# Patient Record
Sex: Male | Born: 1945 | Race: White | Hispanic: No | Smoking: Former smoker
Health system: Southern US, Community
[De-identification: ages and names within clinical notes are randomized; demographics above are authoritative.]

## PROBLEM LIST (undated history)

## (undated) DIAGNOSIS — M1990 Unspecified osteoarthritis, unspecified site: Secondary | ICD-10-CM

## (undated) DIAGNOSIS — C61 Malignant neoplasm of prostate: Secondary | ICD-10-CM

## (undated) DIAGNOSIS — R000 Tachycardia, unspecified: Secondary | ICD-10-CM

## (undated) DIAGNOSIS — I5189 Other ill-defined heart diseases: Secondary | ICD-10-CM

## (undated) DIAGNOSIS — I10 Essential (primary) hypertension: Secondary | ICD-10-CM

## (undated) DIAGNOSIS — E119 Type 2 diabetes mellitus without complications: Secondary | ICD-10-CM

## (undated) DIAGNOSIS — I6529 Occlusion and stenosis of unspecified carotid artery: Secondary | ICD-10-CM

## (undated) DIAGNOSIS — K219 Gastro-esophageal reflux disease without esophagitis: Secondary | ICD-10-CM

## (undated) DIAGNOSIS — I700 Atherosclerosis of aorta: Secondary | ICD-10-CM

## (undated) DIAGNOSIS — E785 Hyperlipidemia, unspecified: Secondary | ICD-10-CM

## (undated) DIAGNOSIS — Z789 Other specified health status: Secondary | ICD-10-CM

## (undated) HISTORY — DX: Malignant neoplasm of prostate: C61

## (undated) HISTORY — DX: Essential (primary) hypertension: I10

## (undated) HISTORY — DX: Occlusion and stenosis of unspecified carotid artery: I65.29

## (undated) HISTORY — DX: Type 2 diabetes mellitus without complications: E11.9

## (undated) HISTORY — DX: Atherosclerosis of aorta: I70.0

## (undated) HISTORY — DX: Other specified health status: Z78.9

## (undated) HISTORY — DX: Other ill-defined heart diseases: I51.89

## (undated) HISTORY — DX: Gastro-esophageal reflux disease without esophagitis: K21.9

## (undated) HISTORY — DX: Unspecified osteoarthritis, unspecified site: M19.90

## (undated) HISTORY — DX: Tachycardia, unspecified: R00.0

## (undated) HISTORY — DX: Hyperlipidemia, unspecified: E78.5

---

## 1971-03-27 HISTORY — PX: APPENDECTOMY: SHX54

## 2008-12-15 ENCOUNTER — Ambulatory Visit: Payer: Self-pay | Admitting: Family Medicine

## 2008-12-28 ENCOUNTER — Ambulatory Visit: Payer: Self-pay | Admitting: Family Medicine

## 2009-03-10 ENCOUNTER — Ambulatory Visit: Payer: Self-pay | Admitting: Unknown Physician Specialty

## 2010-03-26 HISTORY — PX: PROSTATECTOMY: SHX69

## 2011-02-25 ENCOUNTER — Inpatient Hospital Stay: Payer: Self-pay | Admitting: Surgery

## 2011-02-28 LAB — PATHOLOGY REPORT

## 2011-03-27 HISTORY — PX: CHOLECYSTECTOMY: SHX55

## 2011-03-27 HISTORY — PX: OTHER SURGICAL HISTORY: SHX169

## 2013-08-20 DIAGNOSIS — R5383 Other fatigue: Secondary | ICD-10-CM | POA: Insufficient documentation

## 2013-08-20 DIAGNOSIS — K219 Gastro-esophageal reflux disease without esophagitis: Secondary | ICD-10-CM | POA: Insufficient documentation

## 2013-08-26 ENCOUNTER — Ambulatory Visit: Payer: Self-pay | Admitting: Family Medicine

## 2013-09-30 ENCOUNTER — Ambulatory Visit: Payer: Self-pay | Admitting: Gastroenterology

## 2013-09-30 LAB — HM COLONOSCOPY

## 2014-03-09 ENCOUNTER — Ambulatory Visit (INDEPENDENT_AMBULATORY_CARE_PROVIDER_SITE_OTHER): Payer: Commercial Managed Care - HMO | Admitting: Neurology

## 2014-03-09 ENCOUNTER — Encounter: Payer: Self-pay | Admitting: Neurology

## 2014-03-09 VITALS — BP 136/83 | HR 101 | Temp 97.4°F | Ht 67.5 in | Wt 182.0 lb

## 2014-03-09 DIAGNOSIS — R413 Other amnesia: Secondary | ICD-10-CM

## 2014-03-09 NOTE — Patient Instructions (Signed)
Your memory loss is rather mild.  We will recheck again in 6 months and we will consider and brain MRI at the time.  I think overall you are doing fairly well but I do want to suggest a few things today:  Remember to drink plenty of fluid, eat healthy meals and do not skip any meals. Try to eat protein with a every meal and eat a healthy snack such as fruit or nuts in between meals. Try to keep a regular sleep-wake schedule and try to exercise daily, particularly in the form of walking, 20-30 minutes a day, if you can. Good nutrition, proper sleep and exercise can help her cognitive function.  Engage in social activities in your community and with your family and try to keep up with current events by reading the newspaper or watching the news. If you have computer and can go online, try [email or web address]. Also, you may like to do word finding puzzles or crossword puzzles.  As far as your medications are concerned, I would like to suggest no new meds.   As far as diagnostic testing: we will consider a brain MRI next time.   I would like to see you back in 6 months, sooner if we need to. Please call us with any interim questions, concerns, problems, updates or refill requests.  Our phone number is [phone]. We also have an after hours call service for urgent matters and there is a physician on-call for urgent questions. For any emergencies you know to call 911 or go to the nearest emergency room.

## 2014-03-09 NOTE — Progress Notes (Signed)
Subjective:    Patient ID: Gerald Bauer is a 68 y.o. male.  HPI     Star Age, MD, PhD [facility] [address] [place], [place] [address]  Dear Dr. Sanda Klein,    I saw your patient, Gerald Bauer, upon your kind request in my neurologic clinic today for initial consultation of his memory loss. The patient is accompanied by his wife today. As you know, Gerald Bauer is a 68 year old right-handed gentleman with an underlying medical history of hypertension, hyperlipidemia, reflux disease, arthritis and prostate cancer, status post prostatectomy,  cholecystectomy and appendectomy, who reports memory loss for the past year. Recent laboratory test results from 02/15/2014 included a CBC with differential which was unremarkable, total cholesterol elevated at 261 with LDL elevated at 190, CMP normal with blood sugar at 104, TSH normal at 2.45, vitamin D mildly low at 25.9, B12 of 376. He had a carotid Doppler ultrasound on 08/26/2013 which showed no significant stenosis of the right internal carotid artery and approximately 50% stenosis of the left internal carotid artery.  He has never had a brain scan.  He does not have a family history of dementia. His mother lived to be 25 years old and had some memory loss in her early 16s. His father lived to be 59 and died of a heart attack. The patient has been on B12 and vitamin D orally, over-the-counter since his latest blood work. He's going to see a new cardiologist soon. He has been driving without difficulties. There is no report of hallucinations or delusions or significant mood disorder. 3 years ago they bought a motor home and have been traveling with it. He has been able to drive it without any difficulties. They have a trip planned to [place] later this month. He has 2 children. 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 He now works  part-time. 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 He is active. His wife does not feel there is a big problem with his memory. He drinks caffeine regularly. He stopped drinking alcohol when he was started on blood pressure medication. There was no history of heavy drinking. He snores some and has difficulty maintaining sleep. He denies apneas or gasping sensations while asleep. He drinks 4 cups of coffee every day and 1 soda.   His Past Medical History Is Significant For: Past Medical History  Diagnosis Date  . Hypertension   . Hyperlipemia   . Acid reflux   . Arthritis     hands  . Prostate cancer     history    His Past Surgical History Is Significant For: Past Surgical History  Procedure Laterality Date  . Appendectomy  1973  . Prostatectomy  2012  . Choly  2013    His Family History Is Significant For: Family History  Problem Relation Age of Onset  . Dementia Mother   . High Cholesterol Mother   . Hypertension Mother   . Arthritis Maternal Grandmother   . Heart disease Father   . Prostate cancer Brother     His Social History Is Significant For: History   Social History  . Marital Status: Married    Spouse Name: Gerald Bauer    Number of Children: 2  . Years of Education: 12   Occupational History  .      [employer]  Social History Main Topics  . Smoking status: Former Research scientist (life sciences)  . Smokeless tobacco: Former Systems developer     Comment: quit 1999  . Alcohol Use: None     Comment: quit 2014  . Drug Use: No  . Sexual Activity: None   Other Topics Concern  . None   Social History Narrative   Consumes of caffeine daily    His Allergies Are:  Allergies  Allergen Reactions  . Amoxicillin Swelling  :   His Current Medications Are:  Outpatient Encounter Prescriptions as of 03/09/2014  Medication Sig  . Adhesive Bandages (PLASTIC BANDAGES 3/4") MISC   . AFLURIA SUSP   . Alcohol Swabs (ALCOHOL PREPS) PADS   . amLODipine (NORVASC) 10 MG tablet    . aspirin 81 MG tablet Take 81 mg by mouth daily.  Marland Kitchen aspirin EC 81 MG tablet Take by mouth.  . Cholecalciferol (VITAMIN D3) 2000 UNITS TABS Take by mouth. 2000 iu until Jan 25th, then 1000iu daily  . Cyanocobalamin (VITAMIN B-12 CR PO)   . omeprazole (PRILOSEC) 20 MG capsule Take by mouth daily.   Marland Kitchen OVER THE COUNTER MEDICATION Cold and hot patches  . OVER THE COUNTER MEDICATION as needed. Effervescent denture tabs  . ZETIA 10 MG tablet   . ZOSTAVAX 00938 UNT/0.65ML injection   . [DISCONTINUED] UNABLE TO FIND 20 mg daily. omeprazole  . [DISCONTINUED] Applicators (COTTON SWABS) SWAB   . [DISCONTINUED] UNABLE TO FIND 81 mg. aspirin  :  Review of Systems:  Out of a complete 14 point review of systems, all are reviewed and negative with the exception of these symptoms as listed below:   Review of Systems  HENT:       Ringing in ears  Neurological:       Memory loss, headaches   Objective:  Neurologic Exam  Physical Exam Physical Examination:   Filed Vitals:   03/09/14 1317  BP: 136/83  Pulse: 101  Temp: 97.4 F (36.3 C)   General Examination: The patient is a very pleasant 68 y.o. male in no acute distress. He appears well-developed and well-nourished and well groomed.   HEENT: Normocephalic, atraumatic, pupils are equal, round and reactive to light and accommodation. Funduscopic exam is normal with sharp disc margins noted. Extraocular tracking is good without limitation to gaze excursion or nystagmus noted. Normal smooth pursuit is noted. Hearing is grossly intact. Tympanic membranes are clear bilaterally. Face is symmetric with normal facial animation and normal facial sensation. Speech is clear with no dysarthria noted. There is no hypophonia. There is no lip, neck/head, jaw or voice tremor. Neck is supple with full range of passive and active motion. There are no carotid bruits on auscultation. Oropharynx exam reveals: mild mouth dryness, adequate dental hygiene and no  significant airway crowding. Mallampati is class I. Tongue protrudes centrally and palate elevates symmetrically. Tonsils are small.   Chest: Clear to auscultation without wheezing, rhonchi or crackles noted.  Heart: S1+S2+0, regular and normal without murmurs, rubs or gallops noted.   Abdomen: Soft, non-tender and non-distended with normal bowel sounds appreciated on auscultation.  Extremities: There is no pitting edema in the distal lower extremities bilaterally. Pedal pulses are intact.  Skin: Warm and dry without trophic changes noted. There are no varicose veins.  Musculoskeletal: exam reveals no obvious joint deformities, tenderness or joint swelling or erythema.   Neurologically:  Mental status: The patient is awake, alert and oriented in all 4 spheres. His immediate and remote memory, attention, language skills  and fund of knowledge are fairly appropriate. There is no evidence of aphasia, agnosia, apraxia or anomia. Speech is clear with normal prosody and enunciation. Thought process is linear. Mood is normal and affect is normal.   On 03/09/2014: MMSE 28/30, CDT 4/4, AFT 20, GDS: 1/15.  He lost one point on remote recall and one point on orientation.  Cranial nerves II - XII are as described above under HEENT exam. In addition: shoulder shrug is normal with equal shoulder height noted. Motor exam: Normal bulk, strength and tone is noted. There is no drift, tremor or rebound. Romberg is negative. Reflexes are 2+ throughout. Babinski: Toes are flexor bilaterally. Fine motor skills and coordination: intact with normal finger taps, normal hand movements, normal rapid alternating patting, normal foot taps and normal foot agility.  Cerebellar testing: No dysmetria or intention tremor on finger to nose testing. Heel to shin is unremarkable bilaterally. There is no truncal or gait ataxia.  Sensory exam: intact to light touch, pinprick, vibration, temperature sense in the upper and lower  extremities.  Gait, station and balance: He stands easily. No veering to one side is noted. No leaning to one side is noted. Posture is age-appropriate and stance is narrow based. Gait shows normal stride length and normal pace. No problems turning are noted. He turns en bloc. Tandem walk is unremarkable.    Assessment and Plan:   In summary, Gerald Bauer is a very pleasant 68 y.o.-year old male with an underlying medical history of hypertension, hyperlipidemia, reflux disease, arthritis and prostate cancer, status post prostatectomy,  cholecystectomy and appendectomy, who reports memory loss for the past year. His physical and neurological exam are nonfocal. Memory scores suggest mild memory loss, probably in keeping with age-appropriate memory loss versus mild cognitive impairment. I reassured the patient and his wife in that regard. I would like to observe him and suggested a six-month follow-up. We will not do any additional testing at this time. He had recent blood work which we have reviewed together. He has been on over-the-counter supplements for B12 and vitamin D and I encouraged him to continue with them. We will consider an MRI brain at the next visit. Thankfully, his exam is nonfocal.  I had a long chat with the patient and his wife about my findings and the diagnosis of memory loss and dementia, its prognosis and treatment options.. We talked about medical treatments and non-pharmacological approaches. We talked about maintaining a healthy lifestyle in general and staying active mentally and physically. I encouraged the patient to eat healthy, exercise daily and keep well hydrated, to keep a scheduled bedtime and wake time routine, to not skip any meals and eat healthy snacks in between meals and to have protein with every meal. I stressed the importance of regular exercise, within of course the patient's own mobility limitations. I encouraged the patient to keep up with current events by  reading the news paper or watching the news and to do word puzzles, or if feasible, to go on [email or web address].  I did not suggest any new medication at this time. I answered all their questions today and the patient and his wife were in agreement with the above outlined plan.  Thank you very much for allowing me to participate in the care of this nice patient. If I can be of any further assistance to you please do not hesitate to call me at [phone].  Sincerely,   Star Age, MD, PhD

## 2014-04-01 ENCOUNTER — Ambulatory Visit: Payer: Self-pay | Admitting: Cardiovascular Disease

## 2014-06-25 ENCOUNTER — Ambulatory Visit
Admit: 2014-06-25 | Disposition: A | Discharge status: Home / Self Care | Payer: Self-pay | Attending: Family Medicine | Admitting: Family Medicine

## 2014-07-15 ENCOUNTER — Ambulatory Visit
Admit: 2014-07-15 | Disposition: A | Discharge status: Home / Self Care | Payer: Self-pay | Attending: Family Medicine | Admitting: Family Medicine

## 2014-09-08 ENCOUNTER — Ambulatory Visit: Payer: Commercial Managed Care - HMO | Admitting: Neurology

## 2015-02-23 ENCOUNTER — Ambulatory Visit (INDEPENDENT_AMBULATORY_CARE_PROVIDER_SITE_OTHER): Payer: Commercial Managed Care - HMO | Admitting: Family Medicine

## 2015-02-23 ENCOUNTER — Encounter: Payer: Self-pay | Admitting: Family Medicine

## 2015-02-23 VITALS — BP 137/87 | HR 96 | Temp 97.3°F | Ht 67.5 in | Wt 184.0 lb

## 2015-02-23 DIAGNOSIS — E1159 Type 2 diabetes mellitus with other circulatory complications: Secondary | ICD-10-CM | POA: Insufficient documentation

## 2015-02-23 DIAGNOSIS — M6740 Ganglion, unspecified site: Secondary | ICD-10-CM | POA: Diagnosis not present

## 2015-02-23 DIAGNOSIS — Z8546 Personal history of malignant neoplasm of prostate: Secondary | ICD-10-CM | POA: Insufficient documentation

## 2015-02-23 DIAGNOSIS — M25541 Pain in joints of right hand: Secondary | ICD-10-CM | POA: Diagnosis not present

## 2015-02-23 DIAGNOSIS — E785 Hyperlipidemia, unspecified: Secondary | ICD-10-CM | POA: Diagnosis not present

## 2015-02-23 DIAGNOSIS — I6529 Occlusion and stenosis of unspecified carotid artery: Secondary | ICD-10-CM | POA: Insufficient documentation

## 2015-02-23 DIAGNOSIS — E1169 Type 2 diabetes mellitus with other specified complication: Secondary | ICD-10-CM | POA: Insufficient documentation

## 2015-02-23 DIAGNOSIS — R000 Tachycardia, unspecified: Secondary | ICD-10-CM | POA: Insufficient documentation

## 2015-02-23 DIAGNOSIS — I10 Essential (primary) hypertension: Secondary | ICD-10-CM | POA: Insufficient documentation

## 2015-02-23 HISTORY — DX: Pain in joints of right hand: M25.541

## 2015-02-23 HISTORY — DX: Ganglion, unspecified site: M67.40

## 2015-02-23 MED ORDER — EZETIMIBE 10 MG PO TABS: 10.0000 mg | ORAL_TABLET | Freq: Every day | ORAL | Status: DC

## 2015-02-23 NOTE — Assessment & Plan Note (Addendum)
Explained diagnosis, stressed important do NOT drain on his own; refer to hand specialist; the coursing of some blood vessels noted on illumination just distal to the superficial cut (scar) is interesting and I do want the hand specialist to note that

## 2015-02-23 NOTE — Assessment & Plan Note (Signed)
In the middle finger dominant (right) hand; I do not feel trigger or locking with flex or ext, but will appreciate hand specialist also evaluating his pain at the MCP and slight tendon contracture along flexor tendon R3; patient declined pain medicine offer; nothing else on exam to suggest gout or RA

## 2015-02-23 NOTE — Assessment & Plan Note (Signed)
Refill of zetia provided; he is trying to eat a healthy diet

## 2015-02-23 NOTE — Progress Notes (Signed)
BP 137/87 mmHg  Pulse 96  Temp(Src) 97.3 F (36.3 C)  Ht 5' 7.5" (1.715 m)  Wt 184 lb (83.462 kg)  BMI 28.38 kg/m2  SpO2 97%   Subjective:    Patient ID: Gerald Bauer, male    DOB: 09-Jan-1946, 69 y.o.   MRN: [id number]  HPI: Gerald Bauer is a 69 y.o. male  Chief Complaint  Patient presents with  . Thumb pain    left thumb pain for over a month.  . Hand Pain    right hand pain for over a month. no known injury.   Patient is here with his wife; he has two issues going on with his hands  He has noticed a cyst or swelling on the knuckle of the left thumb, been there for at elast a month; no pain; no fevers; did cut the thumb years ago, shallow cut though and doesn't think he penetrated deep enough; does not remember any drainage; knuckle does not turn red or get hot; he can bend it pretty well  On his right hand (he is right-handed), the 3rd MCP hurts; going on for a month at least; has gotten really bad the last 2-3 weeks; no definite bad injury recalled; can flex the joint okay; not really taking anything for it; not a pill taker; knuckle does not get red or hot; no other problems with other joints on his hands  He does not have enough zetia; no side effects; trying to watch a good diet for his cholesterol  Relevant past medical, surgical and social history reviewed and updated as indicated. Interim medical history since our last visit reviewed. Allergies and medications reviewed and updated.  Review of Systems Per HPI unless specifically indicated above     Objective:    BP 137/87 mmHg  Pulse 96  Temp(Src) 97.3 F (36.3 C)  Ht 5' 7.5" (1.715 m)  Wt 184 lb (83.462 kg)  BMI 28.38 kg/m2  SpO2 97%  Wt Readings from Last 3 Encounters:  02/23/15 184 lb (83.462 kg)  07/08/14 181 lb (82.101 kg)  03/09/14 182 lb (82.555 kg)    Physical Exam  Constitutional: He appears well-developed and well-nourished.  Cardiovascular: Normal rate and regular rhythm.    Pulmonary/Chest: Effort normal.  Musculoskeletal:       Right hand: He exhibits tenderness (over the 3rd MCP right hand; no ulnar deviation; no trigger mechanism or locking felt). He exhibits normal range of motion, no deformity and no swelling.       Left hand: He exhibits decreased range of motion (of the left thumb at the interphalangeal joint) and deformity (nodular swelling which has almost three lobes; largest is along the ulnar aspect, next largest is over the radial aspect; both of these easily transilluminate and appear to be in communication with each other; third swelling has some blood vessels when lit).  Very slight depression along the palmar aspect proximal to base of 3rd MCP right hand, suggestion of slight dupuytren's contracture perhaps  Psychiatric: He has a normal mood and affect.    Results for orders placed or performed in visit on 02/23/15  HM COLONOSCOPY  Result Value Ref Range   HM Colonoscopy per PP, repeat 5 years       Assessment & Plan:   Problem List Items Addressed This Visit      Musculoskeletal and Integument   Ganglion cyst - Primary    Explained diagnosis, stressed important do NOT drain on his own; refer to  hand specialist; the coursing of some blood vessels noted on illumination just distal to the superficial cut (scar) is interesting and I do want the hand specialist to note that      Relevant Orders   Ambulatory referral to Orthopedic Surgery     Other   Hyperlipemia    Refill of zetia provided; he is trying to eat a healthy diet      Relevant Medications   amLODipine (NORVASC) 5 MG tablet   ezetimibe (ZETIA) 10 MG tablet   Joint pain in fingers of right hand    In the middle finger dominant (right) hand; I do not feel trigger or locking with flex or ext, but will appreciate hand specialist also evaluating his pain at the MCP and slight tendon contracture along flexor tendon R3; patient declined pain medicine offer; nothing else on exam to  suggest gout or RA      Relevant Orders   Ambulatory referral to Orthopedic Surgery       Follow up plan: No Follow-up on file.

## 2015-02-23 NOTE — Patient Instructions (Addendum)
We'll refer you to the orthopaedist We'll see you for the wellness in April

## 2015-05-04 ENCOUNTER — Other Ambulatory Visit: Payer: Self-pay | Admitting: Family Medicine

## 2015-05-04 DIAGNOSIS — Z8546 Personal history of malignant neoplasm of prostate: Secondary | ICD-10-CM

## 2015-05-04 DIAGNOSIS — E785 Hyperlipidemia, unspecified: Secondary | ICD-10-CM

## 2015-05-04 DIAGNOSIS — I6529 Occlusion and stenosis of unspecified carotid artery: Secondary | ICD-10-CM

## 2015-05-04 DIAGNOSIS — Z5181 Encounter for therapeutic drug level monitoring: Secondary | ICD-10-CM | POA: Insufficient documentation

## 2015-05-04 MED ORDER — EZETIMIBE 10 MG PO TABS: 10.0000 mg | ORAL_TABLET | Freq: Every day | ORAL | Status: DC

## 2015-05-04 MED ORDER — OMEPRAZOLE 20 MG PO CPDR: 20.0000 mg | DELAYED_RELEASE_CAPSULE | Freq: Every day | ORAL | Status: DC | PRN

## 2015-05-04 MED ORDER — AMLODIPINE BESYLATE 5 MG PO TABS: 7.5000 mg | ORAL_TABLET | Freq: Every day | ORAL | Status: DC

## 2015-05-04 NOTE — Telephone Encounter (Signed)
Pt came in has had an insurance change. Needs all RX's transferred to [facility] for insurance purposes. Please contact pt with any questions. Thanks.

## 2015-05-04 NOTE — Telephone Encounter (Signed)
Patient notified, he will come in for labs on Monday. He states we are the only one who checks his PSA so do you want to add that to his lab orders?

## 2015-05-04 NOTE — Telephone Encounter (Signed)
Routing to provider. Needs 90 day supply.

## 2015-05-04 NOTE — Assessment & Plan Note (Signed)
Check lipid panel  

## 2015-05-04 NOTE — Telephone Encounter (Signed)
Great. I added a PSA.

## 2015-05-04 NOTE — Telephone Encounter (Signed)
Certainly; I'll be glad to send refills It looks like it's been a while since last labs Ask if would please have fasting lipids and some other labs done to monitor his medicines Any time in the next few weeks Also, let him know about risks of using PPIs long-term; increases risk of anemia, bone loss, pneumonia, etc; we actually don't like to keep people on this type of drug forever; if he can get by with watching his diet and using Tums and Zantac, that would be better; appt to discuss if lots of questions Lastly, who is checking his PSA?

## 2015-05-04 NOTE — Assessment & Plan Note (Signed)
Check PSA. ?

## 2015-05-04 NOTE — Assessment & Plan Note (Signed)
Check lipids; refilled zetia

## 2015-05-06 IMAGING — US ULTRASOUND RETROPERITONEAL COMPLETE
1 series · 14 of 25 positions shown · non-contrast
Comparison: Limited ultrasound abdomen of 02/24/2011, and CT
abdomen and pelvis of 02/24/2011

CLINICAL DATA: History of carotid atherosclerosis, smoking history,
hypertension

EXAM:
ULTRASOUND RETROPERITONEAL COMPLETE
TECHNIQUE: Ultrasound examination of the abdominal aorta was performed to
evaluate for abdominal aortic aneurysm. The common iliac arteries,
IVC, and kidneys were also evaluated.

[Series 1: ultrasound retroperitoneal complete · 0.31mm/px · 14 of 83 slices shown]
[im 1/83]
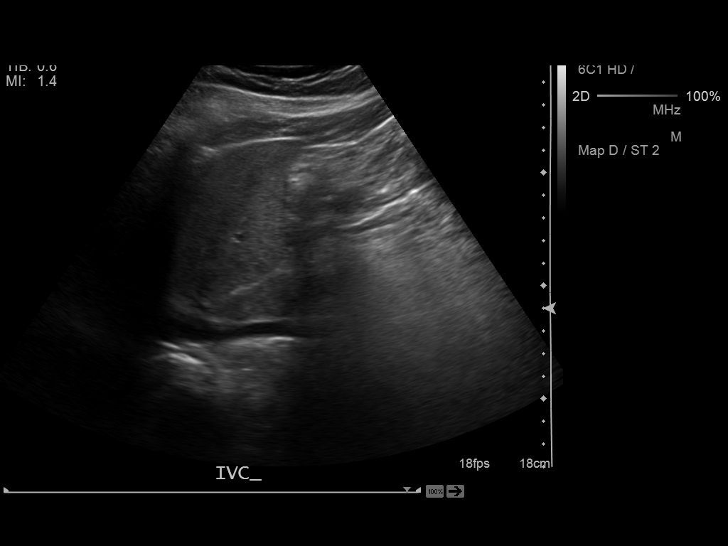
[im 7/83]
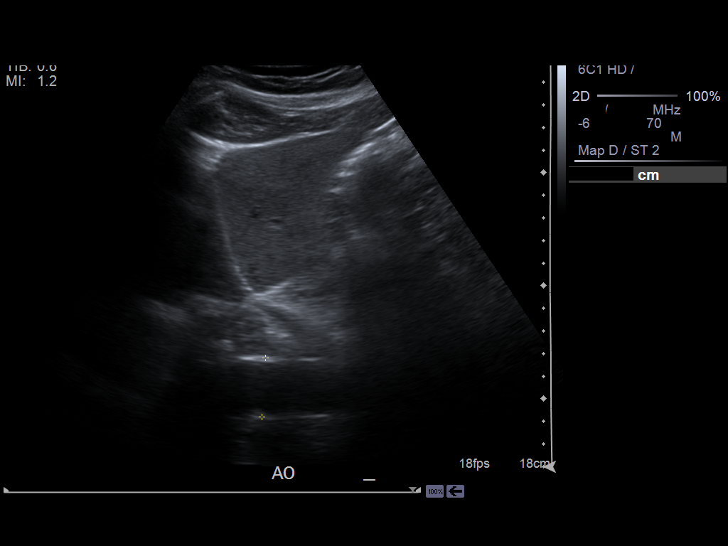
[im 14/83]
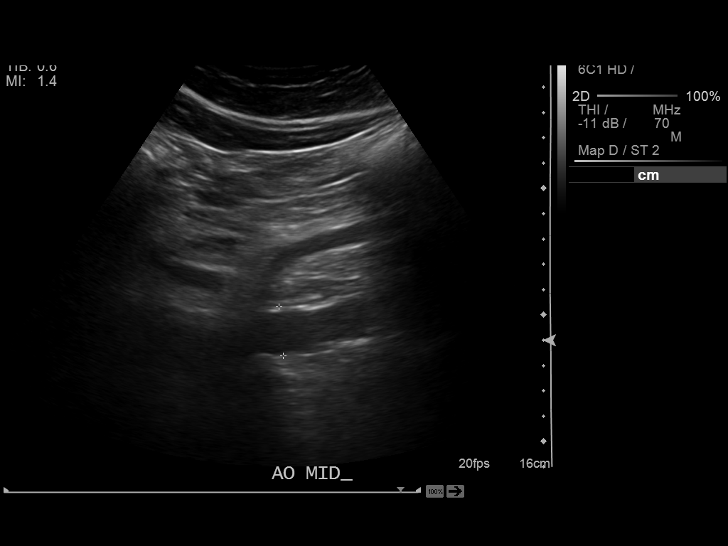
[im 21/83]
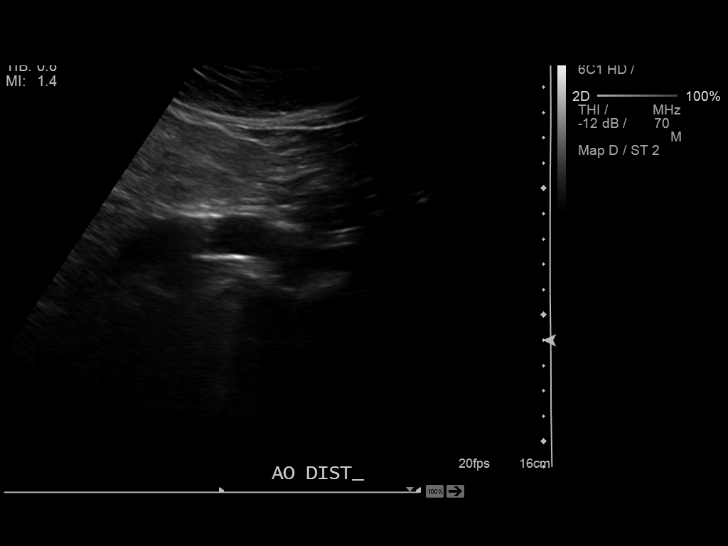
[im 28/83]
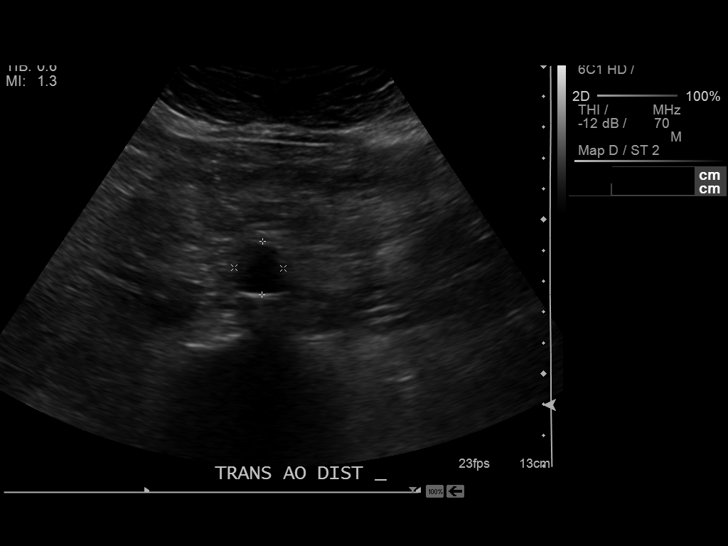
[im 31/83]
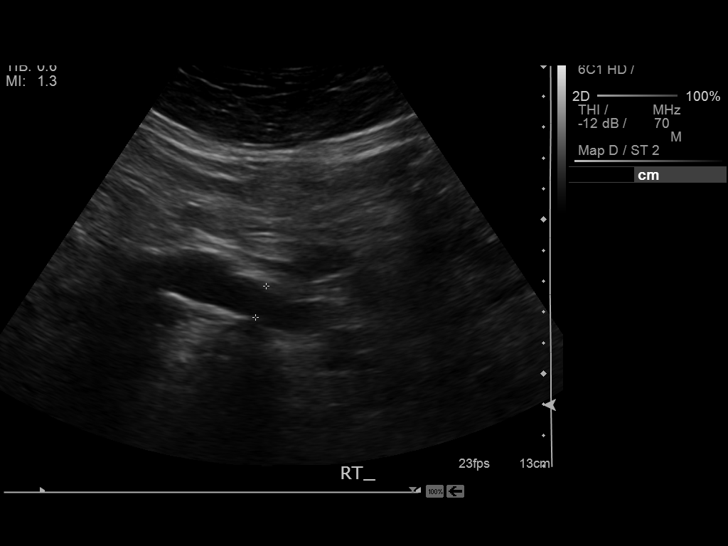
[im 38/83]
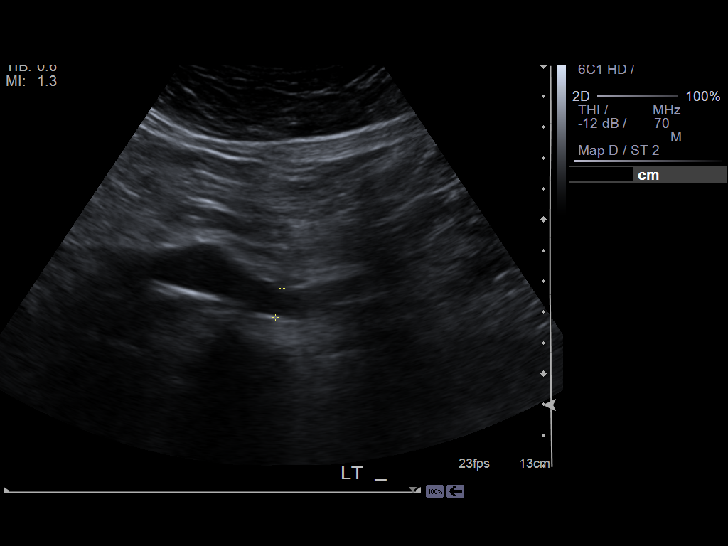
[im 45/83]
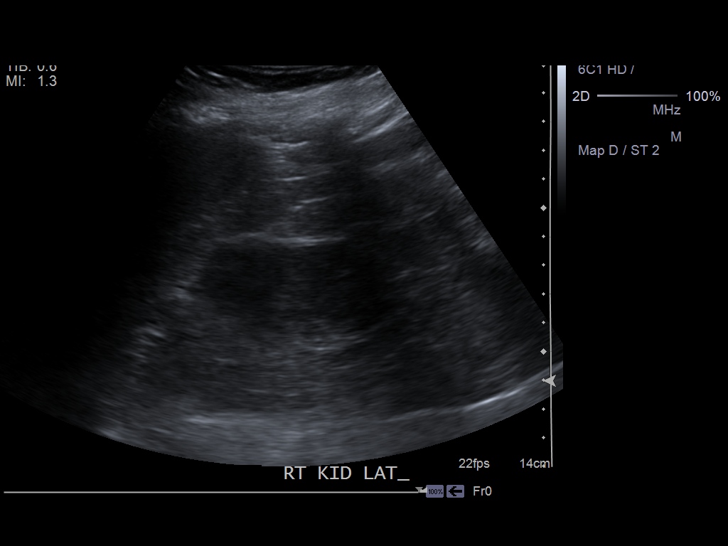
[im 52/83]
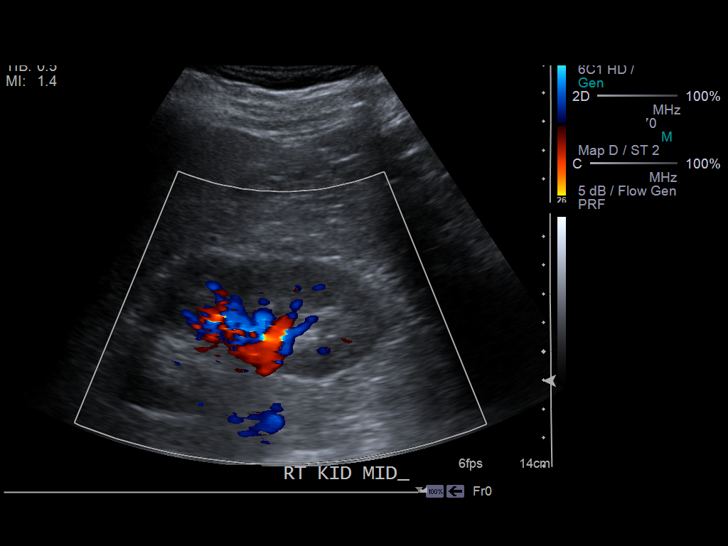
[im 55/83]
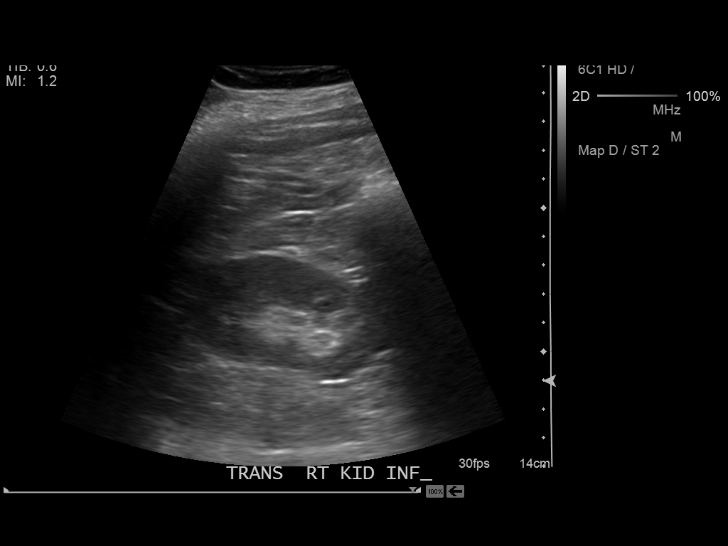
[im 62/83]
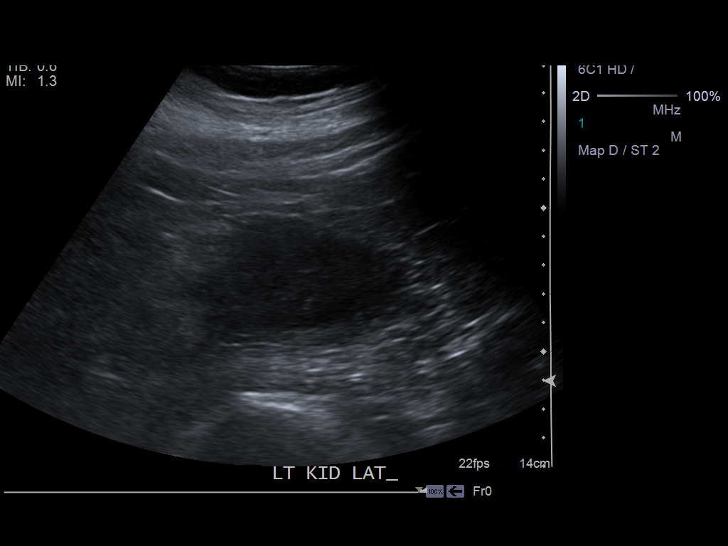
[im 69/83]
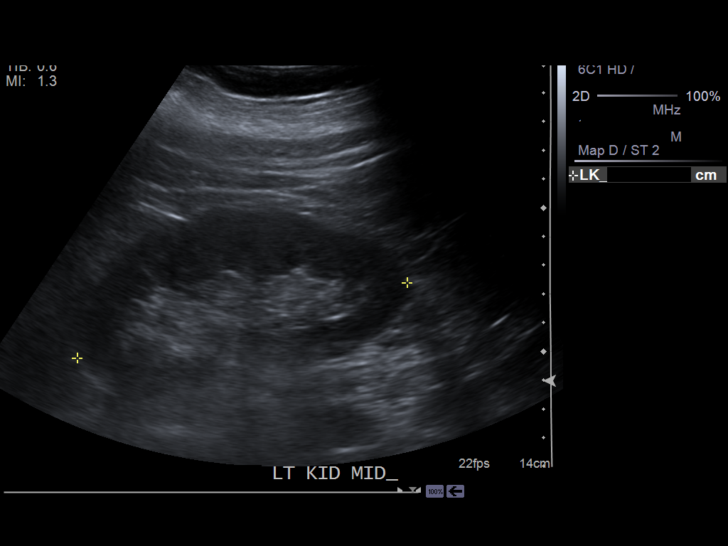
[im 76/83]
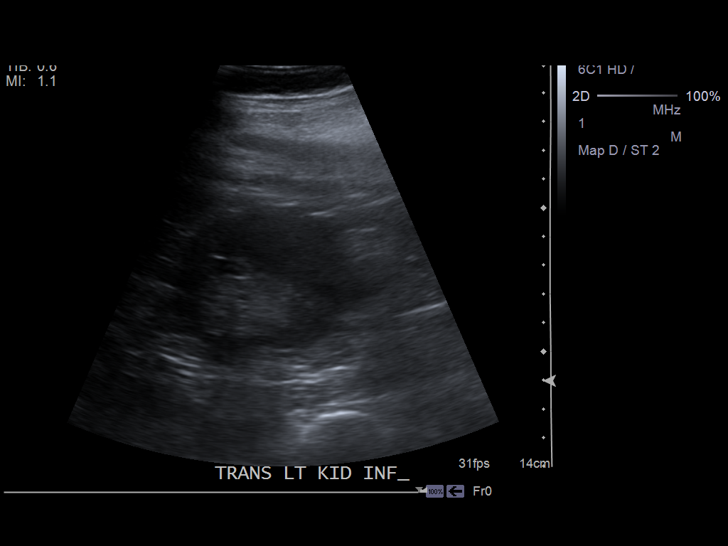
[im 83/83]
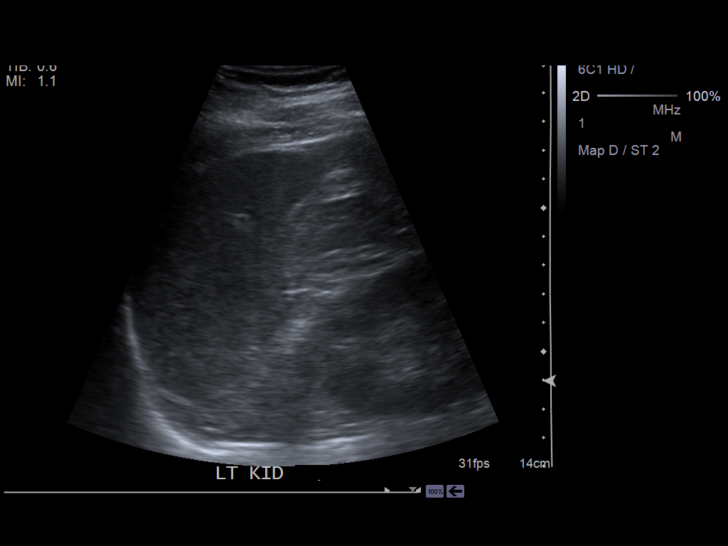

[14 of 25 positions shown; findings below may reference images not displayed]

FINDINGS: Abdominal Aorta

The abdominal aorta is normal in caliber with no focal aneurysm. The
abdominal aorta measures 2.6 x 2.6 cm proximally. The mid abdominal
aorta measures 1.9 x 1.6 cm. The distal abdominal aorta measures
x 1.6 cm.

Maximum AP

Diameter:  2.6 cm proximally.

Maximum TRV

Diameter: 2.6 cm proximally.

Right Common Iliac Artery

1.1 cm.  No aneurysm is noted.

Left Common Iliac Artery

1.2 cm.  No aneurysm is seen.

IVC

No abnormality visualized.

Right Kidney

Length: 10.6 cm.  No hydronephrosis is seen.

Left Kidney

Length: 11.8 cm.  No hydronephrosis is seen.
IMPRESSION: 1. No abdominal aortic aneurysm is seen.
2. No hydronephrosis is noted.

## 2015-05-09 ENCOUNTER — Other Ambulatory Visit: Payer: Medicare Other

## 2015-05-09 DIAGNOSIS — I6529 Occlusion and stenosis of unspecified carotid artery: Secondary | ICD-10-CM

## 2015-05-09 DIAGNOSIS — Z1159 Encounter for screening for other viral diseases: Secondary | ICD-10-CM | POA: Diagnosis not present

## 2015-05-09 DIAGNOSIS — E785 Hyperlipidemia, unspecified: Secondary | ICD-10-CM

## 2015-05-09 DIAGNOSIS — Z8546 Personal history of malignant neoplasm of prostate: Secondary | ICD-10-CM

## 2015-05-09 DIAGNOSIS — Z5181 Encounter for therapeutic drug level monitoring: Secondary | ICD-10-CM | POA: Diagnosis not present

## 2015-05-10 LAB — CBC WITH DIFFERENTIAL/PLATELET
BASOS: 1 %
Basophils Absolute: 0.1 10*3/uL (ref 0.0–0.2)
EOS (ABSOLUTE): 0.1 10*3/uL (ref 0.0–0.4)
EOS: 1 %
Hematocrit: 46.5 % (ref 37.5–51.0)
Hemoglobin: 15.8 g/dL (ref 12.6–17.7)
IMMATURE GRANS (ABS): 0 10*3/uL (ref 0.0–0.1)
Immature Granulocytes: 0 %
LYMPHS: 27 %
Lymphocytes Absolute: 2.4 10*3/uL (ref 0.7–3.1)
MCH: 30.8 pg (ref 26.6–33.0)
MCHC: 34 g/dL (ref 31.5–35.7)
MCV: 91 fL (ref 79–97)
MONOS ABS: 0.7 10*3/uL (ref 0.1–0.9)
Monocytes: 8 %
NEUTROS ABS: 5.6 10*3/uL (ref 1.4–7.0)
Neutrophils: 63 %
PLATELETS: 275 10*3/uL (ref 150–379)
RBC: 5.13 x10E6/uL (ref 4.14–5.80)
RDW: 13.6 % (ref 12.3–15.4)
WBC: 8.9 10*3/uL (ref 3.4–10.8)

## 2015-05-10 LAB — COMPREHENSIVE METABOLIC PANEL
ALBUMIN: 4.3 g/dL (ref 3.5–4.8)
ALT: 27 IU/L (ref 0–44)
AST: 20 IU/L (ref 0–40)
Albumin/Globulin Ratio: 1.8 (ref 1.1–2.5)
Alkaline Phosphatase: 62 IU/L (ref 39–117)
BUN / CREAT RATIO: 13 (ref 10–22)
BUN: 12 mg/dL (ref 8–27)
Bilirubin Total: 0.9 mg/dL (ref 0.0–1.2)
CALCIUM: 9.5 mg/dL (ref 8.6–10.2)
CO2: 25 mmol/L (ref 18–29)
CREATININE: 0.96 mg/dL (ref 0.76–1.27)
Chloride: 100 mmol/L (ref 96–106)
GFR, EST AFRICAN AMERICAN: 92 mL/min/{1.73_m2} (ref >59)
GFR, EST NON AFRICAN AMERICAN: 80 mL/min/{1.73_m2} (ref >59)
GLOBULIN, TOTAL: 2.4 g/dL (ref 1.5–4.5)
GLUCOSE: 140 mg/dL — AB (ref 65–99)
Potassium: 4.8 mmol/L (ref 3.5–5.2)
Sodium: 139 mmol/L (ref 134–144)
TOTAL PROTEIN: 6.7 g/dL (ref 6.0–8.5)

## 2015-05-10 LAB — LIPID PANEL W/O CHOL/HDL RATIO
Cholesterol, Total: 222 mg/dL — ABNORMAL HIGH (ref 100–199)
HDL: 43 mg/dL (ref >39)
LDL Calculated: 152 mg/dL — ABNORMAL HIGH (ref 0–99)
Triglycerides: 137 mg/dL (ref 0–149)
VLDL CHOLESTEROL CAL: 27 mg/dL (ref 5–40)

## 2015-05-10 LAB — PSA: Prostate Specific Ag, Serum: <0.1 ng/mL (ref 0.0–4.0)

## 2015-05-11 ENCOUNTER — Encounter: Payer: Self-pay | Admitting: Family Medicine

## 2015-05-11 LAB — SPECIMEN STATUS REPORT

## 2015-05-11 LAB — HEPATITIS C ANTIBODY

## 2015-05-11 NOTE — Telephone Encounter (Signed)
I called patient and spoke with him personally; offered condolences; mentioned hospice grief counseling; if he needs anything for sleep, depression, anxiety, please come in or let me know; he has pastor/preacher for resource; I am here if needed

## 2015-05-25 ENCOUNTER — Encounter: Payer: Self-pay | Admitting: Family Medicine

## 2015-05-27 NOTE — Telephone Encounter (Signed)
[facility] staff -- will you please handle this question about patient's upcoming appointment? Thank you

## 2015-07-11 ENCOUNTER — Ambulatory Visit (INDEPENDENT_AMBULATORY_CARE_PROVIDER_SITE_OTHER): Payer: Medicare Other | Admitting: Family Medicine

## 2015-07-11 ENCOUNTER — Encounter: Payer: Self-pay | Admitting: Family Medicine

## 2015-07-11 VITALS — BP 112/84 | HR 93 | Temp 98.3°F | Resp 14 | Wt 181.0 lb

## 2015-07-11 DIAGNOSIS — D485 Neoplasm of uncertain behavior of skin: Secondary | ICD-10-CM | POA: Insufficient documentation

## 2015-07-11 DIAGNOSIS — Z0000 Encounter for general adult medical examination without abnormal findings: Secondary | ICD-10-CM

## 2015-07-11 HISTORY — DX: Neoplasm of uncertain behavior of skin: D48.5

## 2015-07-11 NOTE — Patient Instructions (Addendum)
You can call Hospice for grief counseling Address: [address], [place], [place] [address]  Phone: [phone]  Recheck fasting glucose and A1c in the next two weeks  HCPOA and living will paperwork  We'll check on the cholesterol medicine availability for you  Health Maintenance, Male A healthy lifestyle and preventative care can promote health and wellness.  Maintain regular health, dental, and eye exams.  Eat a healthy diet. Foods like vegetables, fruits, whole grains, low-fat dairy products, and lean protein foods contain the nutrients you need and are low in calories. Decrease your intake of foods high in solid fats, added sugars, and salt. Get information about a proper diet from your health care provider, if necessary.  Regular physical exercise is one of the most important things you can do for your health. Most adults should get at least 150 minutes of moderate-intensity exercise (any activity that increases your heart rate and causes you to sweat) each week. In addition, most adults need muscle-strengthening exercises on 2 or more days a week.   Maintain a healthy weight. The body mass index (BMI) is a screening tool to identify possible weight problems. It provides an estimate of body fat based on height and weight. Your health care provider can find your BMI and can help you achieve or maintain a healthy weight. For males 20 years and older:  A BMI below 18.5 is considered underweight.  A BMI of 18.5 to 24.9 is normal.  A BMI of 25 to 29.9 is considered overweight.  A BMI of 30 and above is considered obese.  Maintain normal blood lipids and cholesterol by exercising and minimizing your intake of saturated fat. Eat a balanced diet with plenty of fruits and vegetables. Blood tests for lipids and cholesterol should begin at age 44 and be repeated every 5 years. If your lipid or cholesterol levels are high, you are over age 41, or you are at high risk for heart disease,  you may need your cholesterol levels checked more frequently.Ongoing high lipid and cholesterol levels should be treated with medicines if diet and exercise are not working.  If you smoke, find out from your health care provider how to quit. If you do not use tobacco, do not start.  Lung cancer screening is recommended for adults aged 60-80 years who are at high risk for developing lung cancer because of a history of smoking. A yearly low-dose CT scan of the lungs is recommended for people who have at least a 30-pack-year history of smoking and are current smokers or have quit within the past 15 years. A pack year of smoking is smoking an average of 1 pack of cigarettes a day for 1 year (for example, a 30-pack-year history of smoking could mean smoking 1 pack a day for 30 years or 2 packs a day for 15 years). Yearly screening should continue until the smoker has stopped smoking for at least 15 years. Yearly screening should be stopped for people who develop a health problem that would prevent them from having lung cancer treatment.  If you choose to drink alcohol, do not have more than 2 drinks per day. One drink is considered to be 12 oz (360 mL) of beer, 5 oz (150 mL) of wine, or 1.5 oz (45 mL) of liquor.  Avoid the use of street drugs. Do not share needles with anyone. Ask for help if you need support or instructions about stopping the use of drugs.  High blood pressure causes  heart disease and increases the risk of stroke. High blood pressure is more likely to develop in:  People who have blood pressure in the end of the normal range (100-139/85-89 mm Hg).  People who are overweight or obese.  People who are African American.  If you are 63-53 years of age, have your blood pressure checked every 3-5 years. If you are 10 years of age or older, have your blood pressure checked every year. You should have your blood pressure measured twice--once when you are at a hospital or clinic, and once when  you are not at a hospital or clinic. Record the average of the two measurements. To check your blood pressure when you are not at a hospital or clinic, you can use:  An automated blood pressure machine at a pharmacy.  A home blood pressure monitor.  If you are 75-92 years old, ask your health care provider if you should take aspirin to prevent heart disease.  Diabetes screening involves taking a blood sample to check your fasting blood sugar level. This should be done once every 3 years after age 54 if you are at a normal weight and without risk factors for diabetes. Testing should be considered at a younger age or be carried out more frequently if you are overweight and have at least 1 risk factor for diabetes.  Colorectal cancer can be detected and often prevented. Most routine colorectal cancer screening begins at the age of 7 and continues through age 18. However, your health care provider may recommend screening at an earlier age if you have risk factors for colon cancer. On a yearly basis, your health care provider may provide home test kits to check for hidden blood in the stool. A small camera at the end of a tube may be used to directly examine the colon (sigmoidoscopy or colonoscopy) to detect the earliest forms of colorectal cancer. Talk to your health care provider about this at age 9 when routine screening begins. A direct exam of the colon should be repeated every 5-10 years through age 51, unless early forms of precancerous polyps or small growths are found.  People who are at an increased risk for hepatitis B should be screened for this virus. You are considered at high risk for hepatitis B if:  You were born in a country where hepatitis B occurs often. Talk with your health care provider about which countries are considered high risk.  Your parents were born in a high-risk country and you have not received a shot to protect against hepatitis B (hepatitis B vaccine).  You have HIV  or AIDS.  You use needles to inject street drugs.  You live with, or have sex with, someone who has hepatitis B.  You are a man who has sex with other men (MSM).  You get hemodialysis treatment.  You take certain medicines for conditions like cancer, organ transplantation, and autoimmune conditions.  Hepatitis C blood testing is recommended for all people born from [birth year] through 1966 and any individual with known risk factors for hepatitis C.  Healthy men should no longer receive prostate-specific antigen (PSA) blood tests as part of routine cancer screening. Talk to your health care provider about prostate cancer screening.  Testicular cancer screening is not recommended for adolescents or adult males who have no symptoms. Screening includes self-exam, a health care provider exam, and other screening tests. Consult with your health care provider about any symptoms you have or any concerns you have about  testicular cancer.  Practice safe sex. Use condoms and avoid high-risk sexual practices to reduce the spread of sexually transmitted infections (STIs).  You should be screened for STIs, including gonorrhea and chlamydia if:  You are sexually active and are younger than 24 years.  You are older than 24 years, and your health care provider tells you that you are at risk for this type of infection.  Your sexual activity has changed since you were last screened, and you are at an increased risk for chlamydia or gonorrhea. Ask your health care provider if you are at risk.  If you are at risk of being infected with HIV, it is recommended that you take a prescription medicine daily to prevent HIV infection. This is called pre-exposure prophylaxis (PrEP). You are considered at risk if:  You are a man who has sex with other men (MSM).  You are a heterosexual man who is sexually active with multiple partners.  You take drugs by injection.  You are sexually active with a partner who has  HIV.  Talk with your health care provider about whether you are at high risk of being infected with HIV. If you choose to begin PrEP, you should first be tested for HIV. You should then be tested every 3 months for as long as you are taking PrEP.  Use sunscreen. Apply sunscreen liberally and repeatedly throughout the day. You should seek shade when your shadow is shorter than you. Protect yourself by wearing long sleeves, pants, a wide-brimmed hat, and sunglasses year round whenever you are outdoors.  Tell your health care provider of new moles or changes in moles, especially if there is a change in shape or color. Also, tell your health care provider if a mole is larger than the size of a pencil eraser.  A one-time screening for abdominal aortic aneurysm (AAA) and surgical repair of large AAAs by ultrasound is recommended for men aged 41-75 years who are current or former smokers.  Stay current with your vaccines (immunizations).   This information is not intended to replace advice given to you by your health care provider. Make sure you discuss any questions you have with your health care provider.   Document Released: 09/08/2007 Document Revised: 04/02/2014 Document Reviewed: 08/07/2010 Elsevier Interactive Patient Education [employer].

## 2015-07-11 NOTE — Progress Notes (Signed)
Patient: Gerald Bauer, Male    DOB: 1945/11/24, 70 y.o.   MRN: [id number]  Visit Date: 07/11/15  Today's Provider: Enid Derry, MD   Chief Complaint  Patient presents with  . Annual Exam    Subjective:   Gerald Bauer is a 70 y.o. male who presents today for his Subsequent Annual Wellness Visit.  USPSTF grade A and B recommendations Alcohol: no Depression: Depression screen [facility] 2/9 07/11/2015  Decreased Interest 0  Down, Depressed, Hopeless 3  PHQ - 2 Score 3  Altered sleeping 0  Tired, decreased energy 0  Change in appetite 1  Feeling bad or failure about yourself  0  Trouble concentrating 2  Moving slowly or fidgety/restless 0  Suicidal thoughts 0  PHQ-9 Score 6  Difficult doing work/chores Not difficult at all    Hypertension: controlled with CCB Obesity: stable Tobacco use: quit in 1998 HIV, hep B, hep C: neg hep C, no desire for the others STD testing and prevention (chl/gon/syphilis): n/a Lipids: consider injectable Glucose: recheck, under incredible stress Colorectal cancer: 2015; next in 5 years Breast cancer: no lumps Lung cancer: n/a Osteoporosis: n/a AAA: done, no AAA Aspirin: taking baby 81 mg daily Diet: typical American Exercise: active, 3 miles a day most days; 6 miles last week, no chest pain 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 Acid reflux   . Arthritis     hands  . Hyperlipemia   . Hypertension   . Prostate cancer ([place])     history  . Tachycardia   . Carotid atherosclerosis    Past Surgical History  Procedure Laterality Date  . Appendectomy  1973  . Prostatectomy  2012  . Choly  2013  . Cholecystectomy  2013   Family History  Problem Relation Age of Onset  . Dementia Mother   . High Cholesterol Mother   . Hypertension Mother   . Arthritis Mother   . Hyperlipidemia Mother   . Arthritis Maternal  Grandmother   . Cancer Maternal Grandmother     colon  . Heart disease Father   . Heart attack Father   . Prostate cancer Brother   . Cancer Brother     prostate  . Cancer Maternal Uncle     colon  . COPD Maternal Uncle   . Stroke Paternal Grandmother   . Diabetes Neg Hx    Social History   Social History  . Marital Status: Married    Spouse Name: Regino Schultze  . Number of Children: 2  . Years of Education: 12   Occupational History  .      [employer]   Social History Main Topics  . Smoking status: Former Smoker -- 1.00 packs/day for 40 years    Types: Cigarettes    Quit date: 03/09/1997  . Smokeless tobacco: Former Systems developer     Comment: quit 1999  . Alcohol Use: No     Comment: quit 2014  . Drug Use: No  . Sexual Activity: Not on file   Other Topics Concern  . Not on file   Social History Narrative   Consumes of caffeine daily  MD note: Patient is actually WIDOWED, but I do not know how to update rooming section for this  Outpatient Encounter Prescriptions as of 07/11/2015  Medication Sig Note  . Omega-3 Fatty Acids (FISH OIL) 1200 MG CAPS  Take 1,200 capsules by mouth daily.   Marland Kitchen amLODipine (NORVASC) 5 MG tablet Take 1.5 tablets (7.5 mg total) by mouth daily.   Marland Kitchen aspirin 81 MG tablet Take 81 mg by mouth daily.   . Cholecalciferol (VITAMIN D3) 2000 UNITS TABS Take by mouth. 2000 iu until Jan 25th, then 1000iu daily   . Cyanocobalamin (VITAMIN B-12 CR PO) Take 1,000 mcg by mouth daily.  03/09/2014: Received from: External Pharmacy  . [DISCONTINUED] ezetimibe (ZETIA) 10 MG tablet Take 1 tablet (10 mg total) by mouth at bedtime. (Patient not taking: Reported on 07/11/2015)   . [DISCONTINUED] omeprazole (PRILOSEC) 20 MG capsule Take 1 capsule (20 mg total) by mouth daily as needed. Caution:prolonged use may increase risk of pneumonia, colitis, osteoporosis, anemia (Patient not taking: Reported on 07/11/2015)    No facility-administered encounter medications on file as of 07/11/2015.    Functional Ability / Safety Screening 1.  Was the timed Get Up and Go test longer than 30 seconds?  yes 2.  Does the patient need help with the phone, transportation, shopping,      preparing meals, housework, laundry, medications, or managing money?  yes; son helping with finances 3.  Does the patient's home have:  loose throw rugs in the hallway?   no      Grab bars in the bathroom? no      Handrails on the stairs?   yes      Poor lighting?   yes 4.  Has the patient noticed any hearing difficulties?   no  Fall Risk Assessment See under rooming  Depression Screen See under rooming Depression screen [facility] 2/9 07/11/2015  Decreased Interest 0  Down, Depressed, Hopeless 3  PHQ - 2 Score 3  Altered sleeping 0  Tired, decreased energy 0  Change in appetite 1  Feeling bad or failure about yourself  0  Trouble concentrating 2  Moving slowly or fidgety/restless 0  Suicidal thoughts 0  PHQ-9 Score 6  Difficult doing work/chores Not difficult at all   Advanced Directives Does patient have a HCPOA?    no If yes, name and contact information: recently widowed Does patient have a living will or MOST form?  no  Objective:   Vitals: BP 112/84 mmHg  Pulse 93  Temp(Src) 98.3 F (36.8 C) (Oral)  Resp 14  Wt 181 lb (82.101 kg)  SpO2 97% Body mass index is 27.91 kg/(m^2). No exam data present  Physical Exam  Constitutional: He appears well-developed and well-nourished.  Cardiovascular: Normal rate.   Skin:  Keratotic lesion hand   Mood/affect:  Appropriate, good eye contact with examiner; briefly sad discussing wife's death, loss; not despondent Appearance:  Good hygiene, neatly dressed  Cognitive Testing - 6-CIT  Correct? Score   What year is it? yes 0 Yes = 0    No = 4  What month is it? yes 0 Yes = 0    No = 3  Remember:     [address], [place], [place]     What time is it? yes 0 Yes = 0    No = 3  Count backwards from 20 to 1 yes 0 Correct = 0    1 error = 2    More than 1 error = 4  Say the months of the year in reverse. yes 0 Correct = 0    1 error = 2   More than 1 error = 4  What address did I ask  you to remember? yes 0 Correct = 0  1 error = 2    2 error = 4    3 error = 6    4 error = 8    All wrong = 10       TOTAL SCORE  0/28   Interpretation:  Normal  Normal (0-7) Abnormal (8-28)    Assessment & Plan:     Annual Wellness Visit  Reviewed patient's Family Medical History Reviewed and updated list of patient's medical providers Assessment of cognitive impairment was done Assessed patient's functional ability Established a written schedule for health screening [place] Completed and Reviewed  Immunization History  Administered Date(s) Administered  . Influenza-Unspecified 01/01/2015  . Pneumococcal Polysaccharide-23 02/27/2011  . Td 03/04/2014  . Zoster 02/17/2014    Health Maintenance  Topic Date Due  . PNA vac Low Risk Adult (2 of 2 - PCV13) 02/27/2012  . INFLUENZA VACCINE  10/25/2015  . COLONOSCOPY  10/01/2018  . TETANUS/TDAP  03/04/2024  . ZOSTAVAX  Completed  . Hepatitis C Screening  Completed   Discussed health benefits of physical activity, and encouraged him to engage in regular exercise appropriate for his age and condition.   Meds ordered this encounter  Medications  . Omega-3 Fatty Acids (FISH OIL) 1200 MG CAPS    Sig: Take 1,200 capsules by mouth daily.    Current outpatient prescriptions:  .  Omega-3 Fatty Acids (FISH OIL) 1200 MG CAPS, Take 1,200 capsules by mouth daily., Disp: , Rfl:  .  amLODipine (NORVASC) 5 MG tablet, Take 1.5 tablets (7.5 mg total) by mouth daily., Disp: 135 tablet, Rfl: 3 .  aspirin 81 MG tablet, Take 81 mg by mouth daily., Disp: , Rfl:  .  Cholecalciferol (VITAMIN D3) 2000 UNITS TABS, Take by mouth. 2000 iu until Jan 25th, then 1000iu daily, Disp: , Rfl:  .  Cyanocobalamin (VITAMIN B-12 CR PO), Take 1,000 mcg by mouth daily. , Disp: , Rfl:  Medications  Discontinued During This Encounter  Medication Reason  . ezetimibe (ZETIA) 10 MG tablet Error  . 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

## 2015-08-03 DIAGNOSIS — Z0000 Encounter for general adult medical examination without abnormal findings: Secondary | ICD-10-CM | POA: Insufficient documentation

## 2015-08-03 NOTE — Assessment & Plan Note (Signed)
USPSTF grade A and B recommendations reviewed with patient; age-appropriate recommendations, preventive care, screening tests, etc discussed and encouraged; healthy living encouraged; see AVS for patient education given to patient  

## 2015-11-09 ENCOUNTER — Other Ambulatory Visit: Payer: Self-pay | Admitting: Family Medicine

## 2015-11-29 DIAGNOSIS — L821 Other seborrheic keratosis: Secondary | ICD-10-CM | POA: Diagnosis not present

## 2015-11-29 DIAGNOSIS — L812 Freckles: Secondary | ICD-10-CM | POA: Diagnosis not present

## 2015-11-29 DIAGNOSIS — L578 Other skin changes due to chronic exposure to nonionizing radiation: Secondary | ICD-10-CM | POA: Diagnosis not present

## 2015-11-29 DIAGNOSIS — L570 Actinic keratosis: Secondary | ICD-10-CM | POA: Diagnosis not present

## 2016-01-04 ENCOUNTER — Encounter: Payer: Self-pay | Admitting: Family Medicine

## 2016-01-06 NOTE — Telephone Encounter (Signed)
Gerald Bauer, please update HM list; thank you

## 2016-01-18 ENCOUNTER — Other Ambulatory Visit: Payer: Self-pay | Admitting: Family Medicine

## 2016-01-30 DIAGNOSIS — L720 Epidermal cyst: Secondary | ICD-10-CM | POA: Diagnosis not present

## 2016-01-30 DIAGNOSIS — L821 Other seborrheic keratosis: Secondary | ICD-10-CM | POA: Diagnosis not present

## 2016-01-30 DIAGNOSIS — L578 Other skin changes due to chronic exposure to nonionizing radiation: Secondary | ICD-10-CM | POA: Diagnosis not present

## 2016-01-30 DIAGNOSIS — L570 Actinic keratosis: Secondary | ICD-10-CM | POA: Diagnosis not present

## 2016-03-09 ENCOUNTER — Other Ambulatory Visit (INDEPENDENT_AMBULATORY_CARE_PROVIDER_SITE_OTHER): Payer: Self-pay | Admitting: Vascular Surgery

## 2016-03-09 DIAGNOSIS — I6523 Occlusion and stenosis of bilateral carotid arteries: Secondary | ICD-10-CM

## 2016-03-13 ENCOUNTER — Encounter (INDEPENDENT_AMBULATORY_CARE_PROVIDER_SITE_OTHER): Payer: Self-pay | Admitting: Vascular Surgery

## 2016-03-13 ENCOUNTER — Ambulatory Visit (INDEPENDENT_AMBULATORY_CARE_PROVIDER_SITE_OTHER): Payer: Medicare Other | Admitting: Vascular Surgery

## 2016-03-13 ENCOUNTER — Ambulatory Visit (INDEPENDENT_AMBULATORY_CARE_PROVIDER_SITE_OTHER): Payer: Medicare Other

## 2016-03-13 VITALS — BP 129/87 | HR 95 | Resp 16 | Ht 67.0 in | Wt 183.0 lb

## 2016-03-13 DIAGNOSIS — I10 Essential (primary) hypertension: Secondary | ICD-10-CM

## 2016-03-13 DIAGNOSIS — I6523 Occlusion and stenosis of bilateral carotid arteries: Secondary | ICD-10-CM

## 2016-03-13 DIAGNOSIS — E785 Hyperlipidemia, unspecified: Secondary | ICD-10-CM | POA: Diagnosis not present

## 2016-03-13 DIAGNOSIS — I6529 Occlusion and stenosis of unspecified carotid artery: Secondary | ICD-10-CM | POA: Insufficient documentation

## 2016-03-13 NOTE — Assessment & Plan Note (Signed)
lipid control important in reducing the progression of atherosclerotic disease. Has intolerance to statins despite trying multiple of them, and that is why he is not currently on a statin.

## 2016-03-13 NOTE — Assessment & Plan Note (Signed)
The patient has stable carotid artery stenosis in the 1-39% range on the right and in the 40-59% range on the left. This has not progressed significantly from his study one year ago. Continue aspirin therapy. Intolerant to statins. Recheck carotid duplex in 1 year

## 2016-03-13 NOTE — Assessment & Plan Note (Signed)
blood pressure control important in reducing the progression of atherosclerotic disease. On appropriate oral medications.  

## 2016-03-13 NOTE — Progress Notes (Signed)
MRN : [id number]  Gerald Bauer is a 70 y.o. (26-Oct-1945) male who presents with chief complaint of  Chief Complaint  Patient presents with  . Follow-up  .  History of Present Illness: Patient returns in follow-up of his carotid artery disease. He has lost his wife since his last visit, but he has not had any personal medical issues in that timeframe. He denies focal neurologic symptoms. Specifically, the patient denies amaurosis fugax, speech or swallowing difficulties, or arm or leg weakness or numbness. His carotid duplex today is performed for further evaluation. The patient has stable carotid artery stenosis in the 1-39% range on the right and in the 40-59% range on the left. This has not progressed significantly from his study one year ago.  Current Outpatient Prescriptions  Medication Sig Dispense Refill  . amLODipine (NORVASC) 5 MG tablet TAKE 1 AND 1/2 TABLETS BY  MOUTH DAILY 135 tablet 3  . aspirin 81 MG tablet Take 81 mg by mouth daily.    . Cholecalciferol (VITAMIN D3) 2000 UNITS TABS Take by mouth. 2000 iu until Jan 25th, then 1000iu daily    . Cyanocobalamin (VITAMIN B-12 CR PO) Take 1,000 mcg by mouth daily.     . Omega-3 Fatty Acids (FISH OIL) 1200 MG CAPS Take 1,200 capsules by mouth daily.    Marland Kitchen omeprazole (PRILOSEC) 20 MG capsule Take 1 capsule daily as  needed. Caution:prolonged  use may increase risk of  pneumonia, colitis,  osteoporosis, anemia (Patient not taking: Reported on 03/13/2016) 90 capsule 0   No current facility-administered medications for this visit.     Past Medical History:  Diagnosis Date  . Acid reflux   . Arthritis    hands  . Carotid atherosclerosis   . Hyperlipemia   . Hypertension   . Prostate cancer ([place])    history  . Tachycardia     Past Surgical History:  Procedure Laterality Date  . APPENDECTOMY  1973  . CHOLECYSTECTOMY  2013  . choly  2013  . PROSTATECTOMY  2012    Social History Social History  Substance Use Topics    . Smoking status: Former Smoker    Packs/day: 1.00    Years: 40.00    Types: Cigarettes    Quit date: 03/09/1997  . Smokeless tobacco: Former Systems developer     Comment: quit 1999  . Alcohol use No     Comment: quit 2014    Family History Family History  Problem Relation Age of Onset  . Dementia Mother   . High Cholesterol Mother   . Hypertension Mother   . Arthritis Mother   . Hyperlipidemia Mother   . Arthritis Maternal Grandmother   . Cancer Maternal Grandmother     colon  . Heart disease Father   . Heart attack Father   . Prostate cancer Brother   . Cancer Brother     prostate  . Cancer Maternal Uncle     colon  . COPD Maternal Uncle   . Stroke Paternal Grandmother   . Diabetes Neg Hx     Allergies  Allergen Reactions  . Amoxicillin Swelling  . Welchol [Colesevelam Hcl]      REVIEW OF SYSTEMS (Negative unless checked)  Constitutional: [] Weight loss  [] Fever  [] Chills Cardiac: [] Chest pain   [] Chest pressure   [] Palpitations   [] Shortness of breath when laying flat   [] Shortness of breath at rest   [] Shortness of breath with exertion. Vascular:  [] Pain in legs with  walking   [] Pain in legs at rest   [] Pain in legs when laying flat   [] Claudication   [] Pain in feet when walking  [] Pain in feet at rest  [] Pain in feet when laying flat   [] History of DVT   [] Phlebitis   [] Swelling in legs   [] Varicose veins   [] Non-healing ulcers Pulmonary:   [] Uses home oxygen   [] Productive cough   [] Hemoptysis   [] Wheeze  [] COPD   [] Asthma Neurologic:  [] Dizziness  [] Blackouts   [] Seizures   [] History of stroke   [] History of TIA  [] Aphasia   [] Temporary blindness   [] Dysphagia   [] Weakness or numbness in arms   [] Weakness or numbness in legs Musculoskeletal:  [] Arthritis   [] Joint swelling   [] Joint pain   [] Low back pain Hematologic:  [] Easy bruising  [] Easy bleeding   [] Hypercoagulable state   [] Anemic  [] Hepatitis Gastrointestinal:  [] Blood in stool   [] Vomiting blood   [] Gastroesophageal reflux/heartburn   [] Difficulty swallowing. Genitourinary:  [] Chronic kidney disease   [] Difficult urination  [] Frequent urination  [] Burning with urination   [] Blood in urine Skin:  [] Rashes   [] Ulcers   [] Wounds Psychological:  [] History of anxiety   []  History of major depression.  Physical Examination  Vitals:   03/13/16 1345 03/13/16 1346  BP: 139/90 129/87  Pulse: 95   Resp: 16   Weight: 183 lb (83 kg)   Height: 5\' 7"  (1.702 m)    Body mass index is 28.66 kg/m. Gen:  WD/WN, NAD Head: [place]/AT, No temporalis wasting. Ear/Nose/Throat: Hearing grossly intact, nares w/o erythema or drainage, trachea midline Eyes: Conjunctiva clear. Sclera non-icteric Neck: Supple.  No bruit or JVD.  Pulmonary:  Good air movement, equal and clear to auscultation bilaterally.  Cardiac: RRR, normal S1, S2, no Murmurs, rubs or gallops. Vascular:  Vessel Right Left  Radial Palpable Palpable                                   Gastrointestinal: soft, non-tender/non-distended. No guarding/reflex.  Musculoskeletal: M/S 5/5 throughout.  No deformity or atrophy.  Neurologic: CN 2-12 intact. Sensation grossly intact in extremities.  Symmetrical.  Speech is fluent. Motor exam as listed above. Psychiatric: Judgment intact, Mood & affect appropriate for pt's clinical situation. Dermatologic: No rashes or ulcers noted.  No cellulitis or open wounds. Lymph : No Cervical, Axillary, or Inguinal lymphadenopathy.     CBC Lab Results  Component Value Date   WBC 8.9 05/09/2015   HCT 46.5 05/09/2015   MCV 91 05/09/2015   PLT 275 05/09/2015    BMET    Component Value Date/Time   NA 139 05/09/2015 0818   K 4.8 05/09/2015 0818   CL 100 05/09/2015 0818   CO2 25 05/09/2015 0818   GLUCOSE 140 (H) 05/09/2015 0818   BUN 12 05/09/2015 0818   CREATININE 0.96 05/09/2015 0818   CALCIUM 9.5 05/09/2015 0818   GFRNONAA 80 05/09/2015 0818   GFRAA 92 05/09/2015 0818   CrCl cannot be  calculated (Patient's most recent lab result is older than the maximum 21 days allowed.).  COAG No results found for: INR, PROTIME  Radiology No results found.    Assessment/Plan Hyperlipemia lipid control important in reducing the progression of atherosclerotic disease. Has intolerance to statins despite trying multiple of them, and that is why he is not currently on a statin.  Carotid stenosis The patient has stable  carotid artery stenosis in the 1-39% range on the right and in the 40-59% range on the left. This has not progressed significantly from his study one year ago. Continue aspirin therapy. Intolerant to statins. Recheck carotid duplex in 1 year  Hypertension blood pressure control important in reducing the progression of atherosclerotic disease. On appropriate oral medications.     Leotis Pain, MD  03/13/2016 2:17 PM    This note was created with Dragon medical transcription system.  Any errors from dictation are purely unintentional

## 2016-03-15 ENCOUNTER — Telehealth: Payer: Self-pay | Admitting: Family Medicine

## 2016-03-15 NOTE — Telephone Encounter (Signed)
I spoke w/patient about starting Repatha He's not ready to start but is willing to read about it I encouraged him to consider, as it would really lower his LDL, which we need to get down Dx: bilateral carotid atherosclerosis, hyperlipidemia

## 2016-07-11 ENCOUNTER — Encounter: Payer: Self-pay | Admitting: Family Medicine

## 2016-07-11 ENCOUNTER — Ambulatory Visit (INDEPENDENT_AMBULATORY_CARE_PROVIDER_SITE_OTHER): Payer: Medicare Other | Admitting: Family Medicine

## 2016-07-11 VITALS — BP 128/74 | HR 100 | Temp 97.7°F | Resp 14 | Ht 67.0 in | Wt 182.2 lb

## 2016-07-11 DIAGNOSIS — I6529 Occlusion and stenosis of unspecified carotid artery: Secondary | ICD-10-CM

## 2016-07-11 DIAGNOSIS — Z23 Encounter for immunization: Secondary | ICD-10-CM

## 2016-07-11 DIAGNOSIS — Z8546 Personal history of malignant neoplasm of prostate: Secondary | ICD-10-CM | POA: Diagnosis not present

## 2016-07-11 DIAGNOSIS — Z5181 Encounter for therapeutic drug level monitoring: Secondary | ICD-10-CM

## 2016-07-11 DIAGNOSIS — I10 Essential (primary) hypertension: Secondary | ICD-10-CM | POA: Diagnosis not present

## 2016-07-11 DIAGNOSIS — R0602 Shortness of breath: Secondary | ICD-10-CM | POA: Diagnosis not present

## 2016-07-11 DIAGNOSIS — Z0000 Encounter for general adult medical examination without abnormal findings: Secondary | ICD-10-CM

## 2016-07-11 DIAGNOSIS — R739 Hyperglycemia, unspecified: Secondary | ICD-10-CM

## 2016-07-11 DIAGNOSIS — E782 Mixed hyperlipidemia: Secondary | ICD-10-CM | POA: Diagnosis not present

## 2016-07-11 DIAGNOSIS — E785 Hyperlipidemia, unspecified: Secondary | ICD-10-CM | POA: Diagnosis not present

## 2016-07-11 DIAGNOSIS — E1165 Type 2 diabetes mellitus with hyperglycemia: Secondary | ICD-10-CM | POA: Insufficient documentation

## 2016-07-11 LAB — CBC WITH DIFFERENTIAL/PLATELET
BASOS ABS: 72 {cells}/uL (ref 0–200)
Basophils Relative: 1 %
EOS ABS: 144 {cells}/uL (ref 15–500)
Eosinophils Relative: 2 %
HEMATOCRIT: 46.8 % (ref 38.5–50.0)
HEMOGLOBIN: 16.2 g/dL (ref 13.2–17.1)
LYMPHS ABS: 2088 {cells}/uL (ref 850–3900)
Lymphocytes Relative: 29 %
MCH: 30.9 pg (ref 27.0–33.0)
MCHC: 34.6 g/dL (ref 32.0–36.0)
MCV: 89.3 fL (ref 80.0–100.0)
MONO ABS: 576 {cells}/uL (ref 200–950)
MPV: 9.7 fL (ref 7.5–12.5)
Monocytes Relative: 8 %
Neutro Abs: 4320 cells/uL (ref 1500–7800)
Neutrophils Relative %: 60 %
Platelets: 266 10*3/uL (ref 140–400)
RBC: 5.24 MIL/uL (ref 4.20–5.80)
RDW: 13.4 % (ref 11.0–15.0)
WBC: 7.2 10*3/uL (ref 3.8–10.8)

## 2016-07-11 LAB — COMPLETE METABOLIC PANEL WITH GFR
ALBUMIN: 4.4 g/dL (ref 3.6–5.1)
ALK PHOS: 62 U/L (ref 40–115)
ALT: 23 U/L (ref 9–46)
AST: 20 U/L (ref 10–35)
BUN: 12 mg/dL (ref 7–25)
CALCIUM: 9.6 mg/dL (ref 8.6–10.3)
CO2: 22 mmol/L (ref 20–31)
CREATININE: 0.82 mg/dL (ref 0.70–1.18)
Chloride: 102 mmol/L (ref 98–110)
GFR, Est Non African American: 89 mL/min (ref >=60)
Glucose, Bld: 161 mg/dL — ABNORMAL HIGH (ref 65–99)
POTASSIUM: 4.5 mmol/L (ref 3.5–5.3)
Sodium: 138 mmol/L (ref 135–146)
Total Bilirubin: 0.7 mg/dL (ref 0.2–1.2)
Total Protein: 7.2 g/dL (ref 6.1–8.1)

## 2016-07-11 LAB — LIPID PANEL
CHOL/HDL RATIO: 5.8 ratio — AB (ref <5.0)
Cholesterol: 266 mg/dL — ABNORMAL HIGH (ref <200)
HDL: 46 mg/dL (ref >40)
LDL CALC: 193 mg/dL — AB (ref <100)
Triglycerides: 137 mg/dL (ref <150)
VLDL: 27 mg/dL (ref <30)

## 2016-07-11 MED ORDER — RANITIDINE HCL 150 MG PO TABS: 150.0000 mg | ORAL_TABLET | Freq: Two times a day (BID) | ORAL | 3 refills | Status: DC

## 2016-07-11 MED ORDER — ZOSTER VAC RECOMB ADJUVANTED 50 MCG/0.5ML IM SUSR: 0.5000 mL | Freq: Once | INTRAMUSCULAR | 0 refills | Status: AC

## 2016-07-11 NOTE — Patient Instructions (Addendum)
Switch over to almond milk Please pick up paperwork for advanced directives and [place] Maintenance  Topic Date Due  . INFLUENZA VACCINE  10/24/2016  . COLONOSCOPY  10/01/2018  . TETANUS/TDAP  03/04/2024  . Hepatitis C Screening  Completed  . PNA vac Low Risk Adult  Completed   No coffee or tea or chocolate for 10 hours before bed  Consider Shingrix vaccine at your pharmacy Work on five pounds of weight loss and let me know if shortness of breath doesn't resolve   Health Maintenance, Male A healthy lifestyle and preventive care is important for your health and wellness. Ask your health care provider about what schedule of regular examinations is right for you. What should I know about weight and diet?  Eat a Healthy Diet  Eat plenty of vegetables, fruits, whole grains, low-fat dairy products, and lean protein.  Do not eat a lot of foods high in solid fats, added sugars, or salt. Maintain a Healthy Weight  Regular exercise can help you achieve or maintain a healthy weight. You should:  Do at least 150 minutes of exercise each week. The exercise should increase your heart rate and make you sweat (moderate-intensity exercise).  Do strength-training exercises at least twice a week. Watch Your Levels of Cholesterol and Blood Lipids  Have your blood tested for lipids and cholesterol every 5 years starting at 72 years of age. If you are at high risk for heart disease, you should start having your blood tested when you are 72 years old. You may need to have your cholesterol levels checked more often if:  Your lipid or cholesterol levels are high.  You are older than 72 years of age.  You are at high risk for heart disease. What should I know about cancer screening? Many types of cancers can be detected early and may often be prevented. Lung Cancer  You should be screened every year for lung cancer if:  You are a current smoker who has smoked for at least 30 years.  You  are a former smoker who has quit within the past 15 years.  Talk to your health care provider about your screening options, when you should start screening, and how often you should be screened. Colorectal Cancer  Routine colorectal cancer screening usually begins at 72 years of age and should be repeated every 5-10 years until you are 72 years old. You may need to be screened more often if early forms of precancerous polyps or small growths are found. Your health care provider may recommend screening at an earlier age if you have risk factors for colon cancer.  Your health care provider may recommend using home test kits to check for hidden blood in the stool.  A small camera at the end of a tube can be used to examine your colon (sigmoidoscopy or colonoscopy). This checks for the earliest forms of colorectal cancer. Prostate and Testicular Cancer  Depending on your age and overall health, your health care provider may do certain tests to screen for prostate and testicular cancer.  Talk to your health care provider about any symptoms or concerns you have about testicular or prostate cancer. Skin Cancer  Check your skin from head to toe regularly.  Tell your health care provider about any new moles or changes in moles, especially if:  There is a change in a mole's size, shape, or color.  You have a mole that is larger than a pencil eraser.  Always use  sunscreen. Apply sunscreen liberally and repeat throughout the day.  Protect yourself by wearing long sleeves, pants, a wide-brimmed hat, and sunglasses when outside. What should I know about heart disease, diabetes, and high blood pressure?  If you are 27-66 years of age, have your blood pressure checked every 3-5 years. If you are 52 years of age or older, have your blood pressure checked every year. You should have your blood pressure measured twice-once when you are at a hospital or clinic, and once when you are not at a hospital or  clinic. Record the average of the two measurements. To check your blood pressure when you are not at a hospital or clinic, you can use:  An automated blood pressure machine at a pharmacy.  A home blood pressure monitor.  Talk to your health care provider about your target blood pressure.  If you are between 37-18 years old, ask your health care provider if you should take aspirin to prevent heart disease.  Have regular diabetes screenings by checking your fasting blood sugar level.  If you are at a normal weight and have a low risk for diabetes, have this test once every three years after the age of 54.  If you are overweight and have a high risk for diabetes, consider being tested at a younger age or more often.  A one-time screening for abdominal aortic aneurysm (AAA) by ultrasound is recommended for men aged 85-75 years who are current or former smokers. What should I know about preventing infection? Hepatitis B  If you have a higher risk for hepatitis B, you should be screened for this virus. Talk with your health care provider to find out if you are at risk for hepatitis B infection. Hepatitis C  Blood testing is recommended for:  Everyone born from [birth year] through 1966.  Anyone with known risk factors for hepatitis C. Sexually Transmitted Diseases (STDs)  You should be screened each year for STDs including gonorrhea and chlamydia if:  You are sexually active and are younger than 72 years of age.  You are older than 72 years of age and your health care provider tells you that you are at risk for this type of infection.  Your sexual activity has changed since you were last screened and you are at an increased risk for chlamydia or gonorrhea. Ask your health care provider if you are at risk.  Talk with your health care provider about whether you are at high risk of being infected with HIV. Your health care provider may recommend a prescription medicine to help prevent HIV  infection. What else can I do?  Schedule regular health, dental, and eye exams.  Stay current with your vaccines (immunizations).  Do not use any tobacco products, such as cigarettes, chewing tobacco, and e-cigarettes. If you need help quitting, ask your health care provider.  Limit alcohol intake to no more than 2 drinks per day. One drink equals 12 ounces of beer, 5 ounces of wine, or 1 ounces of hard liquor.  Do not use street drugs.  Do not share needles.  Ask your health care provider for help if you need support or information about quitting drugs.  Tell your health care provider if you often feel depressed.  Tell your health care provider if you have ever been abused or do not feel safe at home. This information is not intended to replace advice given to you by your health care provider. Make sure you discuss any questions you have  with your health care provider. Document Released: 09/08/2007 Document Revised: 11/09/2015 Document Reviewed: 12/14/2014 Elsevier Interactive Patient Education  2017 [employer].

## 2016-07-11 NOTE — Assessment & Plan Note (Addendum)
Check today; he has not tolerated statins; was not interested earlier in PCSK-9 inhibitors; will see what numbers show today; healthy eating encouraged; weight loss five pounds recommended as a start

## 2016-07-11 NOTE — Assessment & Plan Note (Signed)
Check PSA. ?

## 2016-07-11 NOTE — Assessment & Plan Note (Signed)
controlled 

## 2016-07-11 NOTE — Assessment & Plan Note (Signed)
Managed by vascular; aspirin; check lipids

## 2016-07-11 NOTE — Assessment & Plan Note (Addendum)
Check glucose and A1c 

## 2016-07-11 NOTE — Progress Notes (Signed)
Patient: Gerald Bauer, Male    DOB: 1945/11/18, 71 y.o.   MRN: [id number]  Visit Date: 07/14/2016  Today's Provider: Enid Derry, MD   Chief Complaint  Patient presents with  . Medicare Wellness    Subjective:   Gerald Bauer is a 71 y.o. male who presents today for his Subsequent Annual Wellness Visit.  Caregiver input:  Friend here with him; she wants A1c checked; not sleeping like he used to  USPSTF grade A and B recommendations Depression:  Depression screen [facility] 2/9 07/11/2016 07/11/2015  Decreased Interest 0 0  Down, Depressed, Hopeless 0 3  PHQ - 2 Score 0 3  Altered sleeping - 0  Tired, decreased energy - 0  Change in appetite - 1  Feeling bad or failure about yourself  - 0  Trouble concentrating - 2  Moving slowly or fidgety/restless - 0  Suicidal thoughts - 0  PHQ-9 Score - 6  Difficult doing work/chores - Not difficult at all   Hypertension: BP Readings from Last 3 Encounters:  07/11/16 128/74  03/13/16 129/87  07/11/15 112/84   Obesity: Wt Readings from Last 3 Encounters:  07/11/16 182 lb 3.2 oz (82.6 kg)  03/13/16 183 lb (83 kg)  07/11/15 181 lb (82.1 kg)   BMI Readings from Last 3 Encounters:  07/11/16 28.54 kg/m  03/13/16 28.66 kg/m  07/11/15 27.93 kg/m    Alcohol: no Tobacco use: quit 1998 HIV, hep B, hep C: already done STD testing and prevention (chl/gon/syphilis): declined Lipids:  Reviewed last two lipid panels; he has not tolerated statins; was not interested earlier in PCSK-9 inhibitor  Glucose: reviewed previous  Colorectal cancer: 2015; due 2020 Prostate cancer: me, not managed elsewhere, last PSA <0.1 Lab Results  Component Value Date   PSA 0.1 07/11/2016   Lung cancer:  40 years; quit 1998 AAA: already done Aspirin: taking, no problems with bleeding, some acid reflux; prilosec Diet: more sweets and salt;  Exercise: starting back to exercise; no CP; gradually Skin cancer: no worrisome moles; Dr. Nehemiah Massed is his  dermatologist  HPI  Review of Systems  Respiratory: Positive for shortness of breath. Negative for chest tightness.   Cardiovascular: Negative for chest pain.    Past Medical History:  Diagnosis Date  . Acid reflux   . Arthritis    hands  . Carotid atherosclerosis   . Hyperlipemia   . Hypertension   . [place]state cancer ([place])    history  . Tachycardia    Past Surgical History:  Procedure Laterality Date  . APPENDECTOMY  1973  . CHOLECYSTECTOMY  2013  . choly  2013  . PROSTATECTOMY  2012    Family History  Problem Relation Age of Onset  . Dementia Mother   . High Cholesterol Mother   . Hypertension Mother   . Arthritis Mother   . Hyperlipidemia Mother   . Heart disease Father   . Heart attack Father   . Arthritis Maternal Grandmother   . Cancer Maternal Grandmother     colon  . Prostate cancer Brother   . Cancer Brother     prostate  . Cancer Maternal Uncle     colon  . COPD Maternal Uncle   . Stroke Paternal Grandmother   . Aneurysm Paternal Grandfather   . Diabetes Neg Hx    Social History   Social History  . Marital status: Married    Spouse name: Regino Schultze  . Number of children: 2  . Years of education: 53  Occupational History  .      [employer]   Social History Main Topics  . Smoking status: Former Smoker    Packs/day: 1.00    Years: 40.00    Types: Cigarettes    Quit date: 03/09/1997  . Smokeless tobacco: Former Systems developer     Comment: quit 1999  . Alcohol use No     Comment: quit 2014  . Drug use: No  . Sexual activity: Not on file   Other Topics Concern  . Not on file   Social History Narrative   Consumes of caffeine daily    Outpatient Encounter Prescriptions as of 07/11/2016  Medication Sig Note  . amLODipine (NORVASC) 5 MG tablet TAKE 1 AND 1/2 TABLETS BY  MOUTH DAILY   . aspirin 81 MG tablet Take 81 mg by mouth daily.   . cholecalciferol (VITAMIN D) 1000 units tablet Take 1 tablet (1,000 Units total) by mouth daily.   .  Cyanocobalamin (VITAMIN B-12 CR PO) Take 1,000 mcg by mouth daily.  03/09/2014: Received from: External Pharmacy  . Omega-3 Fatty Acids (FISH OIL) 1200 MG CAPS Take 1,200 capsules by mouth daily.   . ranitidine (ZANTAC) 150 MG tablet Take 1 tablet (150 mg total) by mouth 2 (two) times daily.   . [EXPIRED] Zoster Vac Recomb Adjuvanted (SHINGRIX) injection Inject 0.5 mLs into the muscle once.   . [DISCONTINUED] Cholecalciferol (VITAMIN D3) 2000 UNITS TABS Take by mouth. 2000 iu until Jan 25th, then 1000iu daily   . [DISCONTINUED] omeprazole (PRILOSEC) 20 MG capsule Take 1 capsule daily as  needed. Caution:prolonged  use may increase risk of  pneumonia, colitis,  osteoporosis, anemia (Patient not taking: Reported on 03/13/2016)    No facility-administered encounter medications on file as of 07/11/2016.    6CIT Screen 07/11/2016  What Year? 0 points  What month? 0 points  What time? 0 points  Count back from 20 0 points  Months in reverse 0 points  Repeat phrase 4 points  Total Score 4  already saw doctor in [place], friend asked about vitamin supplements  Functional Ability / Safety Screening 1.  Was the timed Get Up and Go test longer than 30 seconds?  no 2.  Does the patient need help with the phone, transportation, shopping,      preparing meals, housework, laundry, medications, or managing money?  no 3.  Does the patient's home have:  loose throw rugs in the hallway?   no      Grab bars in the bathroom? yes      Handrails on the stairs?   yes      Poor lighting?   no 4.  Has the patient noticed any hearing difficulties?   yes; would like to see audiologist at the [facility] See under rooming  Depression Screen See under rooming Depression screen [facility] 2/9 07/11/2016 07/11/2015  Decreased Interest 0 0  Down, Depressed, Hopeless 0 3  PHQ - 2 Score 0 3  Altered sleeping - 0  Tired, decreased energy - 0  Change in appetite - 1  Feeling bad or failure about yourself  -  0  Trouble concentrating - 2  Moving slowly or fidgety/restless - 0  Suicidal thoughts - 0  PHQ-9 Score - 6  Difficult doing work/chores - Not difficult at all   Advanced Directives Does patient have a HCPOA?    no If yes, name and contact information: paperwork today Does patient have a living will or  MOST form?  no  Objective:   Vitals: BP 128/74   Pulse 100   Temp 97.7 F (36.5 C) (Oral)   Resp 14   Ht 5\' 7"  (1.702 m)   Wt 182 lb 3.2 oz (82.6 kg)   SpO2 99%   BMI 28.54 kg/m  Body mass index is 28.54 kg/m. No exam data present  Physical Exam  Constitutional: He appears well-developed and well-nourished.  Cardiovascular: Normal rate and regular rhythm.   Pulmonary/Chest: Effort normal and breath sounds normal. He has no wheezes.  Musculoskeletal: He exhibits no edema.  Psychiatric: He has a normal mood and affect.   Mood/affect:  euthymic Appearance:  Casually dressed  6CIT Screen 07/11/2016  What Year? 0 points  What month? 0 points  What time? 0 points  Count back from 20 0 points  Months in reverse 0 points  Repeat phrase 4 points  Total Score 4    Assessment & Plan:     Annual Wellness Visit  Reviewed patient's Family Medical History Reviewed and updated list of patient's medical providers Assessment of cognitive impairment was done Assessed patient's functional ability Established a written schedule for health screening [place] Completed and Reviewed  Exercise Activities and Dietary recommendations Goals    None    lose five pounds  Immunization History  Administered Date(s) Administered  . Influenza-Unspecified 01/10/2012, 01/01/2013, 01/01/2015  . Pneumococcal Conjugate-13 07/30/2013  . Pneumococcal Polysaccharide-23 02/27/2011  . Td 03/04/2014  . Zoster 02/17/2014    Health Maintenance  Topic Date Due  . INFLUENZA VACCINE  10/24/2016  . COLONOSCOPY  10/01/2018  . TETANUS/TDAP  03/04/2024  . Hepatitis C  Screening  Completed  . PNA vac Low Risk Adult  Completed    Discussed health benefits of physical activity, and encouraged him to engage in regular exercise appropriate for his age and condition.   Meds ordered this encounter  Medications  . ranitidine (ZANTAC) 150 MG tablet    Sig: Take 1 tablet (150 mg total) by mouth 2 (two) times daily.    Dispense:  180 tablet    Refill:  3  . cholecalciferol (VITAMIN D) 1000 units tablet    Sig: Take 1 tablet (1,000 Units total) by mouth daily.  Marland Kitchen Zoster Vac Recomb Adjuvanted [facility]) injection    Sig: Inject 0.5 mLs into the muscle once.    Dispense:  0.5 mL    Refill:  0    Current Outpatient Prescriptions:  .  amLODipine (NORVASC) 5 MG tablet, TAKE 1 AND 1/2 TABLETS BY  MOUTH DAILY, Disp: 135 tablet, Rfl: 3 .  aspirin 81 MG tablet, Take 81 mg by mouth daily., Disp: , Rfl:  .  cholecalciferol (VITAMIN D) 1000 units tablet, Take 1 tablet (1,000 Units total) by mouth daily., Disp: , Rfl:  .  Cyanocobalamin (VITAMIN B-12 CR PO), Take 1,000 mcg by mouth daily. , Disp: , Rfl:  .  Omega-3 Fatty Acids (FISH OIL) 1200 MG CAPS, Take 1,200 capsules by mouth daily., Disp: , Rfl:  .  ranitidine (ZANTAC) 150 MG tablet, Take 1 tablet (150 mg total) by mouth 2 (two) times daily., Disp: 180 tablet, Rfl: 3 Medications Discontinued During This Encounter  Medication Reason  . omeprazole (PRILOSEC) 20 MG capsule Discontinued by provider  . Cholecalciferol (VITAMIN D3) 2000 UNITS TABS Discontinued by provider    Next Medicare Wellness Visit in 12+ months  Problem List Items Addressed This Visit      Cardiovascular and Mediastinum  Hypertension    controlled      Carotid atherosclerosis    Managed by vascular; aspirin; check lipids        Other   Shortness of breath    Patient will try to lose five pounds; if symptoms persist, let me know for work-up      Preventative health care - Primary    USPSTF grade A and B recommendations reviewed  with patient; age-appropriate recommendations, preventive care, screening tests, etc discussed and encouraged; healthy living encouraged; see AVS for patient education given to patient      Personal history of prostate cancer    Check PSA      Relevant Orders   PSA (Completed)   Medication monitoring encounter    Check labs      Relevant Orders   COMPLETE METABOLIC PANEL WITH GFR (Completed)   CBC with Differential/Platelet (Completed)   Hyperlipidemia    Check today; he has not tolerated statins; was not interested earlier in PCSK-9 inhibitors; will see what numbers show today; healthy eating encouraged; weight loss five pounds recommended as a start      Relevant Orders   Lipid panel (Completed)   Hyperglycemia    Check glucose and A1c      Relevant Orders   Hemoglobin A1c (Completed)    Other Visit Diagnoses    Need for vaccination against Streptococcus pneumoniae using pneumococcal conjugate vaccine 13

## 2016-07-11 NOTE — Assessment & Plan Note (Signed)
Check labs 

## 2016-07-12 LAB — HEMOGLOBIN A1C
Hgb A1c MFr Bld: 7.5 % — ABNORMAL HIGH (ref <5.7)
MEAN PLASMA GLUCOSE: 169 mg/dL

## 2016-07-12 LAB — PSA: PSA: 0.1 ng/mL (ref <=4.0)

## 2016-07-14 DIAGNOSIS — R0602 Shortness of breath: Secondary | ICD-10-CM | POA: Insufficient documentation

## 2016-07-14 NOTE — Assessment & Plan Note (Signed)
USPSTF grade A and B recommendations reviewed with patient; age-appropriate recommendations, preventive care, screening tests, etc discussed and encouraged; healthy living encouraged; see AVS for patient education given to patient  

## 2016-07-14 NOTE — Assessment & Plan Note (Signed)
Patient will try to lose five pounds; if symptoms persist, let me know for work-up

## 2016-07-17 ENCOUNTER — Other Ambulatory Visit: Payer: Self-pay | Admitting: Family Medicine

## 2016-07-17 MED ORDER — VITAMIN D 1000 UNITS PO TABS: 1000.0000 [IU] | ORAL_TABLET | Freq: Every day | ORAL | 3 refills | Status: DC

## 2016-07-19 ENCOUNTER — Telehealth: Payer: Self-pay | Admitting: Family Medicine

## 2016-07-19 DIAGNOSIS — E1169 Type 2 diabetes mellitus with other specified complication: Secondary | ICD-10-CM

## 2016-07-19 DIAGNOSIS — B351 Tinea unguium: Secondary | ICD-10-CM | POA: Insufficient documentation

## 2016-07-19 DIAGNOSIS — E1165 Type 2 diabetes mellitus with hyperglycemia: Secondary | ICD-10-CM

## 2016-07-19 MED ORDER — EVOLOCUMAB 140 MG/ML ~~LOC~~ SOSY: 1.0000 | PREFILLED_SYRINGE | SUBCUTANEOUS | 5 refills | Status: DC

## 2016-07-19 NOTE — Assessment & Plan Note (Signed)
Explained new dx; patient declined med; recheck A1c in 3 months; he'll let [facility] know; refer to diabetic educator

## 2016-07-19 NOTE — Telephone Encounter (Signed)
I talked with patient Very high cholesterol; LDL 193 He has not tolerated statins, zetia, welchol at all He has already seen the lab results I offered the every two week shots We can start the prior auth process to see if covered, how much out of pocket Aspirin daily Not eating red meat in a lot time Will put in Rx for Repatha ---------------------------- Discussed the PSA; undetectable the last time Let's recheck in 3 months ---------------------------- We talked about his glucose and A1c He had been on agent orange exposure Encouraged him to notify the [facility] Limit or avoid whites He says he might not be diabetic; he lost his spouse and has not eating the same; his friend is a type 2 diabetic using insulin; they will be eating the same thing Avoid sweetened drinks Offered diabetic education I offered medicine or he can try this on his own without pills; he declined pills He would like to recheck the A1c in 3 months

## 2016-07-30 ENCOUNTER — Telehealth: Payer: Self-pay

## 2016-07-30 NOTE — Telephone Encounter (Signed)
A message was left for this patient on his cell phone regarding the message below and asking him to give [place] a call back when he got the chance.   From: Sharlyn Bologna  Sent: 07/30/2016  4:24 PM  To: Arnetha Courser, MD  Subject: not able to reach patient              Your patient whom you referred for the Medical Nutrition Therapy Program has not come as you had requested.  Please discuss this with your patient.  When he/she decides to commit to the program, please let us know.       Could not leave a message at the number provided. There does not seem to be an answering machine. If you speak to your patient have him call our office.      Thank you for this referral, and if we can be of further assistance to you or your patient, let us know.        [place], [facility]  Administrative Clinical Assistant  Direct Dial: [phone]  Fax: [phone]   Website: [email or web address]

## 2016-08-01 ENCOUNTER — Encounter: Payer: Medicare Other | Attending: Family Medicine | Admitting: *Deleted

## 2016-08-01 ENCOUNTER — Telehealth: Payer: Self-pay | Admitting: Family Medicine

## 2016-08-01 ENCOUNTER — Encounter: Payer: Self-pay | Admitting: *Deleted

## 2016-08-01 VITALS — BP 120/84 | Ht 67.0 in | Wt 188.4 lb

## 2016-08-01 DIAGNOSIS — E1165 Type 2 diabetes mellitus with hyperglycemia: Secondary | ICD-10-CM | POA: Diagnosis not present

## 2016-08-01 DIAGNOSIS — Z713 Dietary counseling and surveillance: Secondary | ICD-10-CM | POA: Insufficient documentation

## 2016-08-01 DIAGNOSIS — E119 Type 2 diabetes mellitus without complications: Secondary | ICD-10-CM

## 2016-08-01 NOTE — Patient Instructions (Addendum)
Check blood sugars 2 x day before breakfast and 2 hrs after one meal every day  Bring blood sugar records to the next class  Call your doctor for a prescription for:  1. Meter strips (type) One Touch Verio      checking  2 times per day  2. Lancets (type) One Touch Delica           checking  2  times per day  Exercise: Continue walking for  30-45  minutes   7 days a week  Eat 3 meals day,  1-2  snacks a day Space meals 4-6 hours apart Allow 2-3 hours between meals and snacks Avoid sugar sweetened drinks (soda) Liimit desserts/sweets  Return for classes on:

## 2016-08-01 NOTE — Progress Notes (Signed)
Diabetes Self-Management Education  Visit Type: First/Initial  Appt. Start Time: 1335 Appt. End Time: 1500  08/01/2016  [name], identified by name and date of birth, is a 71 y.o. male with a diagnosis of Diabetes: Type 2.   ASSESSMENT  Blood pressure 120/84, height 5\' 7"  (1.702 m), weight 188 lb 6.4 oz (85.5 kg). Body mass index is 29.51 kg/m.      Diabetes Self-Management Education - 08/01/16 1542      Visit Information   Visit Type First/Initial     Initial Visit   Diabetes Type Type 2   Are you currently following a meal plan? No   Are you taking your medications as prescribed? Yes   Date Diagnosed April 2018     Health Coping   How would you rate your overall health? Good     Psychosocial Assessment   Patient Belief/Attitude about Diabetes Motivated to manage diabetes   Self-care barriers None   Self-management support Doctor's office;Friends   Other persons present Friend   Patient Concerns Nutrition/Meal planning;Glycemic Control;Weight Control   Special Needs None   Preferred Learning Style Visual   Learning Readiness Change in progress   How often do you need to have someone help you when you read instructions, pamphlets, or other written materials from your doctor or pharmacy? 1 - Never   What is the last grade level you completed in school? 12th     Pre-Education Assessment   Patient understands the diabetes disease and treatment process. Needs Instruction   Patient understands incorporating nutritional management into lifestyle. Needs Instruction   Patient undertands incorporating physical activity into lifestyle. Needs Instruction   Patient understands using medications safely. Needs Instruction   Patient understands monitoring blood glucose, interpreting and using results Needs Instruction   Patient understands prevention, detection, and treatment of acute complications. Needs Instruction   Patient understands prevention, detection, and treatment  of chronic complications. Needs Instruction   Patient understands how to develop strategies to address psychosocial issues. Needs Instruction   Patient understands how to develop strategies to promote health/change behavior. Needs Instruction     Complications   Last HgB A1C per patient/outside source 7.5 %  07/11/16   How often do you check your blood sugar? 0 times/day (not testing)  Provided One Touch Verio Flex meter and instructed on use. BG upon return demonstration was 265 mg/dL at 2:45 pm - 2 1/2 hrs pp. Pt had BBQ, fries, hushpuppies and slaw for lunch.    Have you had a dilated eye exam in the past 12 months? Yes   Have you had a dental exam in the past 12 months? Yes   Are you checking your feet? Yes   How many days per week are you checking your feet? 7     Dietary Intake   Breakfast hamburger bun with tomato and sausage or biscuit with peanut butter or cereal with almond milk   Snack (morning) string cheese or graham crackers and peanut butter or cheese and crackers or protein bar or popcorn   Lunch beanie weenies or ham and cheese sandwich or soup or vienna sausage   Snack (afternoon) same as morning snack   Dinner ham and cheese sandwich   Beverage(s) water, coffee, almond milk     Exercise   Exercise Type Light (walking / raking leaves)   How many days per week to you exercise? 7   How many minutes per day do you exercise? 30   Total  minutes per week of exercise 210     Patient Education   Previous Diabetes Education No   Disease state  Definition of diabetes, type 1 and 2, and the diagnosis of diabetes;Factors that contribute to the development of diabetes   Nutrition management  Role of diet in the treatment of diabetes and the relationship between the three main macronutrients and blood glucose level;Carbohydrate counting;Reviewed blood glucose goals for pre and post meals and how to evaluate the patients' food intake on their blood glucose level.   Physical activity  and exercise  Role of exercise on diabetes management, blood pressure control and cardiac health.   Monitoring Taught/evaluated SMBG meter.;Purpose and frequency of SMBG.;Taught/discussed recording of test results and interpretation of SMBG.;Identified appropriate SMBG and/or A1C goals.   Chronic complications Relationship between chronic complications and blood glucose control   Psychosocial adjustment Identified and addressed patients feelings and concerns about diabetes;Role of stress on diabetes     Individualized Goals (developed by patient)   Reducing Risk Improve blood sugars Lose weight Become more fit     Outcomes   Expected Outcomes Demonstrated interest in learning. Expect positive outcomes   Future DMSE 2 wks      Individualized Plan for Diabetes Self-Management Training:   Learning Objective:  Patient will have a greater understanding of diabetes self-management. Patient education plan is to attend individual and/or group sessions per assessed needs and concerns.   Plan:   Patient Instructions  Check blood sugars 2 x day before breakfast and 2 hrs after one meal every day Bring blood sugar records to the next class Call your doctor for a prescription for:  1. Meter strips (type) One Touch Verio      checking  2 times per day  2. Lancets (type) One Touch Delica          checking  2  times per day Exercise: Continue walking for  30-45  minutes   7 days a week Eat 3 meals day,  1-2  snacks a day Space meals 4-6 hours apart Allow 2-3 hours between meals and snacks Avoid sugar sweetened drinks (soda) Liimit desserts/sweets   Expected Outcomes:  Demonstrated interest in learning. Expect positive outcomes  Education material provided:  General Meal Planning Guidelines Simple Meal Plan Meter = One Touch Verio Flex  If problems or questions, patient to contact team via:  Johny Drilling, RN, CCM, CDE [phone]  Future DSME appointment: 2 wks  Aug 16, 2016 for  Diabetes Class 1

## 2016-08-01 NOTE — Telephone Encounter (Signed)
Requesting refill on one touch lancets velica and one touch strips verio. Please send to [facility]

## 2016-08-02 MED ORDER — GLUCOSE BLOOD VI STRP: ORAL_STRIP | 3 refills | Status: DC

## 2016-08-02 MED ORDER — LANCETS MISC: 3 refills | Status: DC

## 2016-08-02 NOTE — Telephone Encounter (Signed)
I could not find "velica"; send lancet of choice, other Rx sent

## 2016-08-14 ENCOUNTER — Other Ambulatory Visit: Payer: Self-pay | Admitting: Family Medicine

## 2016-08-14 DIAGNOSIS — E782 Mixed hyperlipidemia: Secondary | ICD-10-CM

## 2016-08-14 NOTE — Assessment & Plan Note (Signed)
Request evaluation by lipid specialist to consider repatha

## 2016-08-15 ENCOUNTER — Encounter: Payer: Self-pay | Admitting: Family Medicine

## 2016-08-15 ENCOUNTER — Telehealth: Payer: Self-pay | Admitting: Family Medicine

## 2016-08-15 NOTE — Telephone Encounter (Signed)
Letter ready.

## 2016-08-15 NOTE — Telephone Encounter (Signed)
Pt will pick the letter up.

## 2016-08-15 NOTE — Telephone Encounter (Signed)
Pt said that the [facility] is needing something from the Dr stating that he has been diag with Type 2 Diabetes. If any questions call the patient.

## 2016-08-16 ENCOUNTER — Encounter: Payer: Medicare Other | Admitting: Dietician

## 2016-08-16 ENCOUNTER — Encounter: Payer: Self-pay | Admitting: Dietician

## 2016-08-16 VITALS — Wt 181.2 lb

## 2016-08-16 DIAGNOSIS — E119 Type 2 diabetes mellitus without complications: Secondary | ICD-10-CM

## 2016-08-16 DIAGNOSIS — E1165 Type 2 diabetes mellitus with hyperglycemia: Secondary | ICD-10-CM | POA: Diagnosis not present

## 2016-08-16 DIAGNOSIS — Z713 Dietary counseling and surveillance: Secondary | ICD-10-CM | POA: Diagnosis not present

## 2016-08-16 NOTE — Progress Notes (Signed)
Appt. Start Time: 9:00am Appt. End Time: 12:00 pm  Class 1 Diabetes Overview - define DM; state own type of DM; identify functions of pancreas and insulin; define insulin deficiency vs insulin resistance  Psychosocial - identify DM as a source of stress; state the effects of stress on BG control; verbalize appropriate stress management techniques; identify personal stress issues   Nutritional Management - describe effects of food on blood glucose; identify sources of carbohydrate, protein and fat; verbalize the importance of balance meals in controlling blood glucose; identify meals as well balanced or not; estimate servings of carbohydrate from menus; use food labels to identify servings size, content of carbohydrate, fiber, protein, fat, saturated fat and sodium; recognize food sources of fat, saturated fat, trans fat, sodium and verbalize goals for intake; describe healthful appropriate food choices when dining out   Exercise - describe the effects of exercise on blood glucose and importance of regular exercise in controlling diabetes; state a plan for personal exercise; verbalize contraindications for exercise  Self-Monitoring - state importance of HBGM and demo procedure accurately; use HBGM results to effectively manage diabetes; identify importance of regular HbA1C testing and goals for results  Acute Complications/Sick Day Guidelines - recognize hyperglycemia and hypoglycemia with causes and effects; identify blood glucose results as high, low or in control; list steps in treating and preventing high and low blood glucose; state appropriate measure to manage blood glucose when ill (need for meds, HBGM plan, when to call physician, need for fluids)  Chronic Complications/Foot, Skin, Eye Dental Care - identify possible long-term complications of diabetes (retinopathy, neuropathy, nephropathy, cardiovascular disease, infections); explain steps in prevention and treatment of chronic complications;  state importance of daily self-foot exams; describe how to examine feet and what to look for; explain appropriate eye and dental care  Lifestyle Changes/Goals & Health/Community Resources - state benefits of making appropriate lifestyle changes; identify habits that need to change (meals, tobacco, alcohol); identify strategies to reduce risk factors for personal health; set goals for proper diabetes care; state need for and frequency of healthcare follow-up; describe appropriate community resources for good health (ADA, web sites, apps)   Pregnancy/Sexual Health - define gestational diabetes; state importance of good blood glucose control and birth control prior to pregnancy; state importance of good blood glucose control in preventing sexual problems (impotence, vaginal dryness, infections, loss of desire); state relationship of blood glucose control and pregnancy outcome; describe risk of maternal and fetal complications  Teaching Materials Used: Class 1 Slides/Notebook Diabetes Booklet ID Card  Medic Alert/Medic ID Forms Sleep Evaluation Exercise Handout Daily Food Record Planning a Balanced Meal Goals for Class 1 

## 2016-08-23 ENCOUNTER — Encounter: Payer: Medicare Other | Admitting: *Deleted

## 2016-08-23 ENCOUNTER — Encounter: Payer: Self-pay | Admitting: *Deleted

## 2016-08-23 VITALS — Wt 180.9 lb

## 2016-08-23 DIAGNOSIS — E119 Type 2 diabetes mellitus without complications: Secondary | ICD-10-CM

## 2016-08-23 DIAGNOSIS — Z713 Dietary counseling and surveillance: Secondary | ICD-10-CM | POA: Diagnosis not present

## 2016-08-23 DIAGNOSIS — E1165 Type 2 diabetes mellitus with hyperglycemia: Secondary | ICD-10-CM | POA: Diagnosis not present

## 2016-08-23 NOTE — Progress Notes (Signed)
Appt. Start Time: 0900 Appt. End Time: 1200  Class 2 Nutritional Management - identify sources of carbohydrate, protein and fat; plan balanced meals; estimate servings of carbohydrates in meals  Psychosocial - identify DM as a source of stress; state the effects of stress on BG control  Exercise - describe the effects of exercise on blood glucose and importance of regular exercise in controlling diabetes; state a plan for personal exercise; verbalize contraindications for exercise  Self-Monitoring - state importance of SMBG; use SMBG results to effectively manage diabetes; identify importance of regular HbA1C testing and goals for results  Acute Complications - recognize hyperglycemia and hypoglycemia with causes and effects; identify blood glucose results as high, low or in control; list steps in treating and preventing high and low blood glucose  Sick Day Guidelines: state appropriate measure to manage blood glucose when ill (need for meds, HBGM plan, when to call physician, need for fluids)  Chronic Complications/Foot, Skin, Eye Dental Care - identify possible long-term complications of diabetes (retinopathy, neuropathy, nephropathy, cardiovascular disease, infections); explain steps in prevention and treatment of chronic complications; state importance of daily self-foot exams; describe how to examine feet and what to look for; explain appropriate eye and dental care  Lifestyle Changes/Goals - state benefits of making appropriate lifestyle changes; identify habits that need to change (meals, tobacco, alcohol); identify strategies to reduce risk factors for personal health  Pregnancy/Sexual Health - state importance of good blood glucose control in preventing sexual problems (impotence, vaginal dryness, infections, loss of desire)  Teaching Materials Used: Class 2 Slide Packet A1C Pamphlet Foot Care Literature Kidney Test Handout Quick and "Balanced" Meal Ideas Carb Counting and Meal  Planning Book Goals for Class 2 

## 2016-08-30 ENCOUNTER — Encounter: Payer: Medicare Other | Attending: Family Medicine | Admitting: Dietician

## 2016-08-30 ENCOUNTER — Encounter: Payer: Self-pay | Admitting: Dietician

## 2016-08-30 VITALS — BP 132/80 | Wt 181.6 lb

## 2016-08-30 DIAGNOSIS — E119 Type 2 diabetes mellitus without complications: Secondary | ICD-10-CM

## 2016-08-30 DIAGNOSIS — E1165 Type 2 diabetes mellitus with hyperglycemia: Secondary | ICD-10-CM | POA: Insufficient documentation

## 2016-08-30 DIAGNOSIS — Z713 Dietary counseling and surveillance: Secondary | ICD-10-CM | POA: Insufficient documentation

## 2016-08-30 NOTE — Progress Notes (Signed)

## 2016-09-10 ENCOUNTER — Encounter: Payer: Self-pay | Admitting: *Deleted

## 2016-09-24 ENCOUNTER — Other Ambulatory Visit: Payer: Self-pay

## 2016-09-24 DIAGNOSIS — E1165 Type 2 diabetes mellitus with hyperglycemia: Secondary | ICD-10-CM

## 2016-09-24 MED ORDER — GLUCOSE BLOOD VI STRP: ORAL_STRIP | 3 refills | Status: DC

## 2016-09-24 NOTE — Telephone Encounter (Signed)
Patient called needs refill because he has been checking BG 3x day because that is what they told him to do at the DM class over at the hospital

## 2016-09-24 NOTE — Telephone Encounter (Signed)
Refilled

## 2016-10-18 DIAGNOSIS — E1165 Type 2 diabetes mellitus with hyperglycemia: Secondary | ICD-10-CM | POA: Diagnosis not present

## 2016-10-18 LAB — HM DIABETES EYE EXAM

## 2016-10-30 DIAGNOSIS — H40003 Preglaucoma, unspecified, bilateral: Secondary | ICD-10-CM | POA: Diagnosis not present

## 2016-11-07 DIAGNOSIS — H40003 Preglaucoma, unspecified, bilateral: Secondary | ICD-10-CM | POA: Diagnosis not present

## 2016-11-23 DIAGNOSIS — H40033 Anatomical narrow angle, bilateral: Secondary | ICD-10-CM | POA: Diagnosis not present

## 2017-01-10 ENCOUNTER — Ambulatory Visit (INDEPENDENT_AMBULATORY_CARE_PROVIDER_SITE_OTHER): Payer: Medicare Other | Admitting: Family Medicine

## 2017-01-10 ENCOUNTER — Encounter: Payer: Self-pay | Admitting: Family Medicine

## 2017-01-10 VITALS — BP 122/78 | HR 93 | Temp 98.2°F | Resp 14 | Wt 182.4 lb

## 2017-01-10 DIAGNOSIS — R0602 Shortness of breath: Secondary | ICD-10-CM | POA: Diagnosis not present

## 2017-01-10 DIAGNOSIS — Z8546 Personal history of malignant neoplasm of prostate: Secondary | ICD-10-CM

## 2017-01-10 DIAGNOSIS — M79672 Pain in left foot: Secondary | ICD-10-CM

## 2017-01-10 DIAGNOSIS — E782 Mixed hyperlipidemia: Secondary | ICD-10-CM

## 2017-01-10 DIAGNOSIS — R5383 Other fatigue: Secondary | ICD-10-CM | POA: Diagnosis not present

## 2017-01-10 DIAGNOSIS — Z23 Encounter for immunization: Secondary | ICD-10-CM | POA: Diagnosis not present

## 2017-01-10 DIAGNOSIS — Z5181 Encounter for therapeutic drug level monitoring: Secondary | ICD-10-CM | POA: Diagnosis not present

## 2017-01-10 DIAGNOSIS — E119 Type 2 diabetes mellitus without complications: Secondary | ICD-10-CM | POA: Diagnosis not present

## 2017-01-10 DIAGNOSIS — B353 Tinea pedis: Secondary | ICD-10-CM | POA: Diagnosis not present

## 2017-01-10 DIAGNOSIS — I6523 Occlusion and stenosis of bilateral carotid arteries: Secondary | ICD-10-CM | POA: Diagnosis not present

## 2017-01-10 DIAGNOSIS — M8580 Other specified disorders of bone density and structure, unspecified site: Secondary | ICD-10-CM | POA: Diagnosis not present

## 2017-01-10 DIAGNOSIS — B351 Tinea unguium: Secondary | ICD-10-CM

## 2017-01-10 DIAGNOSIS — E1165 Type 2 diabetes mellitus with hyperglycemia: Secondary | ICD-10-CM | POA: Diagnosis not present

## 2017-01-10 DIAGNOSIS — I6529 Occlusion and stenosis of unspecified carotid artery: Secondary | ICD-10-CM | POA: Diagnosis not present

## 2017-01-10 HISTORY — DX: Tinea pedis: B35.3

## 2017-01-10 NOTE — Assessment & Plan Note (Signed)
Keep trying to limit fatty meats; check lipids; intolerant to statins; considering the injectable, but patient wants to think about it

## 2017-01-10 NOTE — Assessment & Plan Note (Signed)
Seeing Dr. Lucky Cowboy; continue aspirin; yearly [place]; intolerant to statins

## 2017-01-10 NOTE — Assessment & Plan Note (Signed)
Refer to podiatrist; okay to try dilute vinegar or vicks on nails, 6-9 months of regular treatment

## 2017-01-10 NOTE — Assessment & Plan Note (Signed)
Check liver, kidneys 

## 2017-01-10 NOTE — Assessment & Plan Note (Signed)
CXR; offered PFTs; he opted to see how his does; offered medicine if symptomatic

## 2017-01-10 NOTE — Assessment & Plan Note (Signed)
Refer to podiatrist 

## 2017-01-10 NOTE — Assessment & Plan Note (Signed)
Sounds pretty well controlled; check labs today; foot exam by MD; eye exam UTD

## 2017-01-10 NOTE — Assessment & Plan Note (Signed)
Not following with urologist; check PSA yearly

## 2017-01-10 NOTE — Patient Instructions (Addendum)
Let's get you to a foot specialist You can try tolnaftate cream on the skin of the feet for the fungus, daily or twice a day for 4 weeks We'll have you continue medicines Consider the injectable for cholesterol Try to limit saturated fats in your diet (bologna, hot dogs, barbeque, cheeseburgers, hamburgers, steak, bacon, sausage, cheese, etc.) and get more fresh fruits, vegetables, and whole grains Let me know if you decide to do lung testing or want to try an inhaler

## 2017-01-10 NOTE — Progress Notes (Signed)
BP 122/78 (BP Location: Right Arm)   Pulse 93   Temp 98.2 F (36.8 C) (Oral)   Resp 14   Wt 182 lb 6.4 oz (82.7 kg)   SpO2 97%   BMI 28.57 kg/m    Subjective:    Patient ID: Gerald Bauer, male    DOB: 04-Sep-1945, 71 y.o.   MRN: [id number]  HPI: Gerald Bauer is a 71 y.o. male  Chief Complaint  Patient presents with  . Follow-up    HPI Type 2 diabetes; this mroning 184; lowest 109; no low numbers; does need to see the foot doctor; last eye exam was July 2018; did the classes; dental exam UTD; 3 teeth fractured  Lab Results  Component Value Date   HGBA1C 7.5 (H) 07/11/2016   Hard to eat well by himself; tried salads; rinses canned beans off first Fungus in toenails; has not tried anything topically  High cholesterol; not sure what he wants to try; not eating as much fatty meats; still has fatty meat once in a while LDL 193; mother took statins  Hx of prostate cancer; stream is okay; not seeing urologist  Quit smoking in 1998; smoked for 40 years; limited at high elevations, gives out of breath easier; sometimes walking up a hill, feels it; had a stress test already, cardiologist at [facility], Dr. Clayborn Bigness  CLINICAL DATA:  Shortness of breath.  Fatigue.   EXAM:  CHEST  2 VIEW   COMPARISON:  None.   FINDINGS:  The heart size and mediastinal contours are within normal limits.  Both lungs are clear. The visualized skeletal structures are  unremarkable.   IMPRESSION:  Normal exam.    Electronically Signed    By: Lorriane Shire M.D.    On: 06/25/2014 12:06  Swelling lower neck; has [place] years ago; for 12 years or more; no changes in hair or skin; regular BMs   Depression screen [facility] 2/9 01/10/2017 08/01/2016 07/11/2016 07/11/2015  Decreased Interest 0 0 0 0  Down, Depressed, Hopeless 0 0 0 3  PHQ - 2 Score 0 0 0 3  Altered sleeping - - - 0  Tired, decreased energy - - - 0  Change in appetite - - - 1  Feeling bad or failure about yourself  - - - 0    Trouble concentrating - - - 2  Moving slowly or fidgety/restless - - - 0  Suicidal thoughts - - - 0  PHQ-9 Score - - - 6  Difficult doing work/chores - - - Not difficult at all    Relevant past medical, surgical, family and social history reviewed Past Medical History:  Diagnosis Date  . Acid reflux   . Arthritis    hands  . Carotid atherosclerosis   . Diabetes mellitus without complication ([place])   . Hyperlipemia   . Hypertension   . Prostate cancer ([place])    history  . Tachycardia    Past Surgical History:  Procedure Laterality Date  . APPENDECTOMY  1973  . CHOLECYSTECTOMY  2013  . choly  2013  . PROSTATECTOMY  2012   Family History  Problem Relation Age of Onset  . Dementia Mother   . High Cholesterol Mother   . Hypertension Mother   . Arthritis Mother   . Hyperlipidemia Mother   . Heart disease Father   . Heart attack Father   . Arthritis Maternal Grandmother   . Cancer Maternal Grandmother        colon  .  Prostate cancer Brother   . Cancer Brother        prostate  . Cancer Maternal Uncle        colon  . COPD Maternal Uncle   . Stroke Paternal Grandmother   . Diabetes Paternal Grandmother   . Aneurysm Paternal Grandfather    Social History   Social History  . Marital status: Married    Spouse name: Regino Schultze  . Number of children: 2  . Years of education: 12   Occupational History  .      [employer]   Social History Main Topics  . Smoking status: Former Smoker    Packs/day: 1.00    Years: 40.00    Types: Cigarettes    Quit date: 03/09/1997  . Smokeless tobacco: Former Systems developer     Comment: quit 1999  . Alcohol use No     Comment: quit 2014  . Drug use: No  . Sexual activity: No   Other Topics Concern  . Not on file   Social History Narrative   Consumes of caffeine daily    Interim medical history since last visit reviewed. Allergies and medications reviewed  Review of Systems  Constitutional: Negative for unexpected weight change.   Respiratory: Positive for shortness of breath. Negative for wheezing.   Cardiovascular: Negative for chest pain and leg swelling.  Genitourinary: Negative for enuresis, frequency and hematuria.   Per HPI unless specifically indicated above     Objective:    BP 122/78 (BP Location: Right Arm)   Pulse 93   Temp 98.2 F (36.8 C) (Oral)   Resp 14   Wt 182 lb 6.4 oz (82.7 kg)   SpO2 97%   BMI 28.57 kg/m   Wt Readings from Last 3 Encounters:  01/10/17 182 lb 6.4 oz (82.7 kg)  08/30/16 181 lb 9.6 oz (82.4 kg)  08/23/16 180 lb 14.4 oz (82.1 kg)    Physical Exam  Constitutional: He appears well-developed and well-nourished.  HENT:  Head: Normocephalic and atraumatic.  Eyes: No scleral icterus.  Neck: No JVD present. No thyromegaly present.  Cardiovascular: Normal rate and regular rhythm.   Pulmonary/Chest: Effort normal and breath sounds normal. No respiratory distress. He has no wheezes. He has no rales.  Abdominal: He exhibits no distension.  Musculoskeletal: He exhibits no edema.       Feet:  Neurological: He is alert.  Skin: No pallor.  Psychiatric: He has a normal mood and affect.   Diabetic Foot Form - Detailed   Diabetic Foot Exam - detailed Diabetic Foot exam was performed with the following findings:  Yes 01/10/2017  9:10 AM  Visual Foot Exam completed.:  Yes  Pulse Foot Exam completed.:  Yes  Right Dorsalis Pedis:  Present Left Dorsalis Pedis:  Present  Sensory Foot Exam Completed.:  Yes Semmes-Weinstein Monofilament Test R Site 1-Great Toe:  Pos L Site 1-Great Toe:  Pos        Results for orders placed or performed in visit on 10/19/16  HM DIABETES EYE EXAM  Result Value Ref Range   HM Diabetic Eye Exam No Retinopathy No Retinopathy      Assessment & Plan:   Problem List Items Addressed This Visit      Cardiovascular and Mediastinum   Carotid stenosis    Seeing Dr. Lucky Cowboy; continue aspirin; yearly [place]; intolerant to statins      Relevant Orders    Lipid panel     Endocrine   Type 2 diabetes mellitus (  [place])    Sounds pretty well controlled; check labs today; foot exam by MD; eye exam UTD      Relevant Orders   Hemoglobin A1c   Microalbumin / creatinine urine ratio     Musculoskeletal and Integument   Tinea pedis of both feet   Relevant Orders   Ambulatory referral to Podiatry   Onychomycosis    Refer to podiatrist; okay to try dilute vinegar or vicks on nails, 6-9 months of regular treatment      Relevant Orders   Ambulatory referral to Podiatry     Other   Shortness of breath    CXR; offered PFTs; he opted to see how his does; offered medicine if symptomatic      Personal history of prostate cancer    Not following with urologist; check PSA yearly      Relevant Orders   PSA   Needs flu shot   Relevant Orders   Flu vaccine HIGH DOSE PF (Fluzone High dose) (Completed)   Medication monitoring encounter    Check liver, kidneys      Relevant Orders   COMPLETE METABOLIC PANEL WITH GFR   Left foot pain - Primary    Refer to podiatrist      Relevant Orders   Ambulatory referral to Podiatry   Hyperlipidemia    Keep trying to limit fatty meats; check lipids; intolerant to statins; considering the injectable, but patient wants to think about it      Relevant Orders   Lipid panel    Other Visit Diagnoses    Other fatigue       Relevant Orders   TSH   VITAMIN D 25 Hydroxy (Vit-D Deficiency, Fractures)       Follow up plan: Return in about 6 months (around 07/11/2017) for twenty minute follow-up with fasting labs.  An after-visit summary was printed and given to the patient at [place].  Please see the patient instructions which may contain other information and recommendations beyond what is mentioned above in the assessment and plan.  No orders of the defined types were placed in this encounter.   Orders Placed This Encounter  Procedures  . Flu vaccine HIGH DOSE PF (Fluzone High dose)  . PSA  .  COMPLETE METABOLIC PANEL WITH GFR  . Hemoglobin A1c  . Lipid panel  . Microalbumin / creatinine urine ratio  . TSH  . VITAMIN D 25 Hydroxy (Vit-D Deficiency, Fractures)  . Ambulatory referral to Podiatry

## 2017-01-11 LAB — LIPID PANEL
CHOL/HDL RATIO: 5.2 (calc) — AB (ref <5.0)
CHOLESTEROL: 235 mg/dL — AB (ref <200)
HDL: 45 mg/dL (ref >40)
LDL Cholesterol (Calc): 165 mg/dL (calc) — ABNORMAL HIGH
NON-HDL CHOLESTEROL (CALC): 190 mg/dL — AB (ref <130)
TRIGLYCERIDES: 119 mg/dL (ref <150)

## 2017-01-11 LAB — COMPLETE METABOLIC PANEL WITH GFR
AG RATIO: 1.7 (calc) (ref 1.0–2.5)
ALBUMIN MSPROF: 4.3 g/dL (ref 3.6–5.1)
ALKALINE PHOSPHATASE (APISO): 57 U/L (ref 40–115)
ALT: 17 U/L (ref 9–46)
AST: 13 U/L (ref 10–35)
BUN: 12 mg/dL (ref 7–25)
CO2: 27 mmol/L (ref 20–32)
Calcium: 9 mg/dL (ref 8.6–10.3)
Chloride: 103 mmol/L (ref 98–110)
Creat: 0.84 mg/dL (ref 0.70–1.18)
GFR, EST NON AFRICAN AMERICAN: 87 mL/min/{1.73_m2} (ref >=60)
GFR, Est African American: 101 mL/min/{1.73_m2} (ref >=60)
Globulin: 2.6 g/dL (calc) (ref 1.9–3.7)
Glucose, Bld: 125 mg/dL — ABNORMAL HIGH (ref 65–99)
Potassium: 4.3 mmol/L (ref 3.5–5.3)
Sodium: 137 mmol/L (ref 135–146)
TOTAL PROTEIN: 6.9 g/dL (ref 6.1–8.1)
Total Bilirubin: 0.8 mg/dL (ref 0.2–1.2)

## 2017-01-11 LAB — MICROALBUMIN / CREATININE URINE RATIO
Creatinine, Urine: 112 mg/dL (ref 20–320)
MICROALB UR: 0.4 mg/dL
Microalb Creat Ratio: 4 mcg/mg creat (ref <30)

## 2017-01-11 LAB — HEMOGLOBIN A1C
Hgb A1c MFr Bld: 6.6 % of total Hgb — ABNORMAL HIGH (ref <5.7)
Mean Plasma Glucose: 143 (calc)
eAG (mmol/L): 7.9 (calc)

## 2017-01-11 LAB — VITAMIN D 25 HYDROXY (VIT D DEFICIENCY, FRACTURES): VIT D 25 HYDROXY: 31 ng/mL (ref 30–100)

## 2017-01-11 LAB — TSH: TSH: 2.11 mIU/L (ref 0.40–4.50)

## 2017-01-11 LAB — PSA: PSA: <0.1 ng/mL (ref <=4.0)

## 2017-01-29 ENCOUNTER — Ambulatory Visit (INDEPENDENT_AMBULATORY_CARE_PROVIDER_SITE_OTHER): Payer: Medicare Other

## 2017-01-29 ENCOUNTER — Encounter: Payer: Self-pay | Admitting: Podiatry

## 2017-01-29 ENCOUNTER — Ambulatory Visit: Payer: Medicare Other | Admitting: Podiatry

## 2017-01-29 DIAGNOSIS — M722 Plantar fascial fibromatosis: Secondary | ICD-10-CM | POA: Diagnosis not present

## 2017-01-29 DIAGNOSIS — M79672 Pain in left foot: Secondary | ICD-10-CM | POA: Diagnosis not present

## 2017-01-29 MED ORDER — DICLOFENAC SODIUM 75 MG PO TBEC: 75.0000 mg | DELAYED_RELEASE_TABLET | Freq: Two times a day (BID) | ORAL | 0 refills | Status: DC

## 2017-01-29 MED ORDER — BETAMETHASONE SOD PHOS & ACET 6 (3-3) MG/ML IJ SUSP: 3.0000 mg | Freq: Once | INTRAMUSCULAR | Status: DC

## 2017-01-29 NOTE — Progress Notes (Signed)
Dg  

## 2017-01-29 NOTE — Progress Notes (Signed)
   Subjective:    Patient ID: Gerald Bauer, male    DOB: 12-26-1945, 71 y.o.   MRN: [id number]  HPI    Review of Systems  All other systems reviewed and are negative.      Objective:   Physical Exam        Assessment & Plan:

## 2017-01-31 NOTE — Progress Notes (Signed)
   Subjective: Patient presents today for sharp, shooting pain and tenderness in the left arch that began about 5-6 months ago. Patient states that it hurts in the mornings with the first steps out of bed. He has not done anything to treat the symptoms. Patient presents today for further treatment and evaluation.   Past Medical History:  Diagnosis Date  . Acid reflux   . Arthritis    hands  . Carotid atherosclerosis   . Diabetes mellitus without complication ([place])   . Hyperlipemia   . Hypertension   . Prostate cancer ([place])    history  . Tachycardia      Objective: Physical Exam General: The patient is alert and oriented x3 in no acute distress.  Dermatology: Skin is warm, dry and supple bilateral lower extremities. Negative for open lesions or macerations bilateral.   Vascular: Dorsalis Pedis and Posterior Tibial pulses palpable bilateral.  Capillary fill time is immediate to all digits.  Neurological: Epicritic and protective threshold intact bilateral.   Musculoskeletal: Tenderness to palpation at the medial calcaneal tubercale and through the insertion of the plantar fascia of the left foot. All other joints range of motion within normal limits bilateral. Strength 5/5 in all groups bilateral.   Radiographic exam:   Normal osseous mineralization. Joint spaces preserved. No fracture/dislocation/boney destruction. Calcaneal spur present with mild thickening of plantar fascia left. No other soft tissue abnormalities or radiopaque foreign bodies.   Assessment: 1. Plantar fasciitis left foot  Plan of Care:  1. Patient evaluated. Xrays reviewed.   2. Injection of 0.5cc Celestone soluspan injected into the left plantar fascia.  3. Rx for Diclofenac 75mg  PO BID ordered for patient. 4. Plantar fascial band(s) dispensed  5. Instructed patient regarding therapies and modalities at home to alleviate symptoms.  6. Return to clinic in 3 weeks.     Edrick Kins, DPM [facility]  Dr. Edrick Kins, DPM    [address],  [address]                Office [phone]  Fax [phone]

## 2017-02-12 ENCOUNTER — Ambulatory Visit: Payer: Medicare Other | Admitting: Podiatry

## 2017-02-19 ENCOUNTER — Other Ambulatory Visit: Payer: Self-pay | Admitting: Family Medicine

## 2017-03-15 ENCOUNTER — Encounter (INDEPENDENT_AMBULATORY_CARE_PROVIDER_SITE_OTHER): Payer: Self-pay | Admitting: Vascular Surgery

## 2017-03-15 ENCOUNTER — Ambulatory Visit (INDEPENDENT_AMBULATORY_CARE_PROVIDER_SITE_OTHER): Payer: Medicare Other

## 2017-03-15 ENCOUNTER — Ambulatory Visit (INDEPENDENT_AMBULATORY_CARE_PROVIDER_SITE_OTHER): Payer: Medicare Other | Admitting: Vascular Surgery

## 2017-03-15 VITALS — BP 135/89 | HR 90 | Resp 17 | Wt 182.0 lb

## 2017-03-15 DIAGNOSIS — I6523 Occlusion and stenosis of bilateral carotid arteries: Secondary | ICD-10-CM | POA: Diagnosis not present

## 2017-03-15 DIAGNOSIS — I10 Essential (primary) hypertension: Secondary | ICD-10-CM | POA: Diagnosis not present

## 2017-03-15 DIAGNOSIS — E782 Mixed hyperlipidemia: Secondary | ICD-10-CM | POA: Diagnosis not present

## 2017-03-15 NOTE — Assessment & Plan Note (Signed)
His carotid duplex today demonstrates stable 1-39% right ICA stenosis and stable 40-59% left ICA stenosis without significant progression from his previous study.  We will continue aspirin therapy.  He is intolerant to statins.  Recheck in 1 year

## 2017-03-15 NOTE — Patient Instructions (Signed)
Carotid Artery Disease The carotid arteries are arteries on both sides of the neck. They carry blood to the brain. Carotid artery disease is when the arteries get smaller (narrow) or get blocked. If these arteries get smaller or get blocked, you are more likely to have a stroke or warning stroke (transient ischemic attack). Follow these instructions at home:  Take medicines as told by your doctor. Make sure you understand all your medicine instructions. Do not stop your medicines without talking to your doctor first.  Follow your doctor's diet instructions. It is important to eat a healthy diet that includes plenty of: ? Fresh fruits. ? Vegetables. ? Lean meats.  Avoid: ? High-fat foods. ? High-sodium foods. ? Foods that are fried, overly processed, or have poor nutritional value.  Stay a healthy weight.  Stay active. Get at least 30 minutes of activity every day.  Do not smoke.  Limit alcohol use to: ? No more than 2 drinks a day for men. ? No more than 1 drink a day for women who are not pregnant.  Do not use illegal drugs.  Keep all doctor visits as told. Get help right away if:  You have sudden weakness or loss of feeling (numbness) on one side of the body, such as the face, arm, or leg.  You have sudden confusion.  You have trouble speaking (aphasia) or understanding.  You have sudden trouble seeing out of one or both eyes.  You have sudden trouble walking.  You have dizziness or feel like you might pass out (faint).  You have a loss of balance or your movements are not steady (uncoordinated).  You have a sudden, severe headache with no known cause.  You have trouble swallowing (dysphagia). Call your local emergency services (911 in [place] Do notdrive yourself to the clinic or hospital. This information is not intended to replace advice given to you by your health care provider. Make sure you discuss any questions you have with your health care  provider. Document Released: 02/27/2012 Document Revised: 08/18/2015 Document Reviewed: 09/10/2012 Elsevier Interactive Patient Education  2018 Elsevier Inc.  

## 2017-03-15 NOTE — Progress Notes (Signed)
MRN : [id number]  Gerald Bauer is a 71 y.o. (Jun 15, 1945) male who presents with chief complaint of  Chief Complaint  Patient presents with  . Carotid    22yr follow up  .  History of Present Illness: Patient returns in follow-up of his carotid disease.  He is doing well without specific complaints today.  He denies focal neurologic symptoms. Specifically, the patient denies amaurosis fugax, speech or swallowing difficulties, or arm or leg weakness or numbness.  His carotid duplex today demonstrates stable 1-39% right ICA stenosis and stable 40-59% left ICA stenosis without significant progression from his previous study.  Current Outpatient Medications  Medication Sig Dispense Refill  . amLODipine (NORVASC) 5 MG tablet TAKE 1 AND 1/2 TABLETS BY  MOUTH DAILY 135 tablet 3  . aspirin 81 MG tablet Take 81 mg by mouth daily.    . cholecalciferol (VITAMIN D) 1000 units tablet Take 1 tablet (1,000 Units total) by mouth daily. 90 tablet 3  . Cyanocobalamin (VITAMIN B-12 CR PO) Take 1,000 mcg by mouth daily.     . diclofenac (VOLTAREN) 75 MG EC tablet Take 1 tablet (75 mg total) 2 (two) times daily by mouth. 60 tablet 0  . glucose blood (ONETOUCH VERIO) test strip Check fingerstick blood sugars once a day; Dx E11.65; LON 99 months 100 each 3  . Lancets MISC Use to check fingerstick blood sugars once a day; LON 99 months; E11.65 100 each 3  . Omega-3 Fatty Acids (FISH OIL) 1200 MG CAPS Take 1 capsule by mouth daily.     . ranitidine (ZANTAC) 150 MG tablet Take 1 tablet (150 mg total) by mouth 2 (two) times daily. (Patient taking differently: Take 150 mg by mouth daily. ) 180 tablet 3   Current Facility-Administered Medications  Medication Dose Route Frequency Provider Last Rate Last Dose  . betamethasone acetate-betamethasone sodium phosphate (CELESTONE) injection 3 mg  3 mg Intramuscular Once Edrick Kins, DPM        Past Medical History:  Diagnosis Date  . Acid reflux   . Arthritis    hands  . Carotid atherosclerosis   . Diabetes mellitus without complication ([place])   . Hyperlipemia   . Hypertension   . Prostate cancer ([place])    history  . Tachycardia     Past Surgical History:  Procedure Laterality Date  . APPENDECTOMY  1973  . CHOLECYSTECTOMY  2013  . choly  2013  . PROSTATECTOMY  2012    Social History       Social History  Substance Use Topics  . Smoking status: Former Smoker    Packs/day: 1.00    Years: 40.00    Types: Cigarettes    Quit date: 03/09/1997  . Smokeless tobacco: Former Systems developer     Comment: quit 1999  . Alcohol use No     Comment: quit 2014    Family History       Family History  Problem Relation Age of Onset  . Dementia Mother   . High Cholesterol Mother   . Hypertension Mother   . Arthritis Mother   . Hyperlipidemia Mother   . Arthritis Maternal Grandmother   . Cancer Maternal Grandmother     colon  . Heart disease Father   . Heart attack Father   . Prostate cancer Brother   . Cancer Brother     prostate  . Cancer Maternal Uncle     colon  . COPD Maternal Uncle   .  Stroke Paternal Grandmother   . Diabetes Neg Hx         Allergies  Allergen Reactions  . Amoxicillin Swelling  . Welchol [Colesevelam Hcl]      REVIEW OF SYSTEMS (Negative unless checked)  Constitutional: [] Weight loss  [] Fever  [] Chills Cardiac: [] Chest pain   [] Chest pressure   [] Palpitations   [] Shortness of breath when laying flat   [] Shortness of breath at rest   [] Shortness of breath with exertion. Vascular:  [] Pain in legs with walking   [] Pain in legs at rest   [] Pain in legs when laying flat   [] Claudication   [] Pain in feet when walking  [] Pain in feet at rest  [] Pain in feet when laying flat   [] History of DVT   [] Phlebitis   [] Swelling in legs   [] Varicose veins   [] Non-healing ulcers Pulmonary:   [] Uses home oxygen   [] Productive cough   [] Hemoptysis   [] Wheeze  [] COPD   [] Asthma Neurologic:   [] Dizziness  [] Blackouts   [] Seizures   [] History of stroke   [] History of TIA  [] Aphasia   [] Temporary blindness   [] Dysphagia   [] Weakness or numbness in arms   [] Weakness or numbness in legs Musculoskeletal:  [x] Arthritis   [] Joint swelling   [] Joint pain   [] Low back pain Hematologic:  [] Easy bruising  [] Easy bleeding   [] Hypercoagulable state   [] Anemic  [] Hepatitis Gastrointestinal:  [] Blood in stool   [] Vomiting blood  [] Gastroesophageal reflux/heartburn   [] Difficulty swallowing. Genitourinary:  [] Chronic kidney disease   [] Difficult urination  [] Frequent urination  [] Burning with urination   [] Blood in urine Skin:  [] Rashes   [] Ulcers   [] Wounds Psychological:  [] History of anxiety   []  History of major depression.      Physical Examination  Vitals:   03/15/17 1348 03/15/17 1350  BP: 129/80 135/89  Pulse: 90   Resp: 17   Weight: 82.6 kg (182 lb)    Body mass index is 28.51 kg/m. Gen:  WD/WN, NAD Head: [place]/AT, No temporalis wasting. Ear/Nose/Throat: Hearing grossly intact, nares w/o erythema or drainage, trachea midline Eyes: Conjunctiva clear. Sclera non-icteric Neck: Supple.  No bruit or JVD.  Pulmonary:  Good air movement, equal and clear to auscultation bilaterally.  Cardiac: RRR, normal S1, S2 Vascular:  Vessel Right Left  Radial Palpable Palpable                                    Musculoskeletal: M/S 5/5 throughout.  No deformity or atrophy. No edema. Neurologic: CN 2-12 intact. Sensation grossly intact in extremities.  Symmetrical.  Speech is fluent. Motor exam as listed above. Psychiatric: Judgment intact, Mood & affect appropriate for pt's clinical situation. Dermatologic: No rashes or ulcers noted.  No cellulitis or open wounds.      CBC Lab Results  Component Value Date   WBC 7.2 07/11/2016   HGB 16.2 07/11/2016   HCT 46.8 07/11/2016   MCV 89.3 07/11/2016   PLT 266 07/11/2016    BMET    Component Value Date/Time   NA 137 01/10/2017  0913   NA 139 05/09/2015 0818   K 4.3 01/10/2017 0913   CL 103 01/10/2017 0913   CO2 27 01/10/2017 0913   GLUCOSE 125 (H) 01/10/2017 0913   BUN 12 01/10/2017 0913   BUN 12 05/09/2015 0818   CREATININE 0.84 01/10/2017 0913   CALCIUM 9.0 01/10/2017 0913  GFRNONAA 87 01/10/2017 0913   GFRAA 101 01/10/2017 0913   CrCl cannot be calculated (Patient's most recent lab result is older than the maximum 21 days allowed.).  COAG No results found for: INR, PROTIME  Radiology No results found.    Assessment/Plan Hyperlipemia lipid control important in reducing the progression of atherosclerotic disease. Has intolerance to statins despite trying multiple of them, and that is why he is not currently on a statin.  Hypertension blood pressure control important in reducing the progression of atherosclerotic disease. On appropriate oral medications.   Carotid stenosis His carotid duplex today demonstrates stable 1-39% right ICA stenosis and stable 40-59% left ICA stenosis without significant progression from his previous study.  We will continue aspirin therapy.  He is intolerant to statins.  Recheck in 1 year    Leotis Pain, MD  03/15/2017 2:10 PM    This note was created with Dragon medical transcription system.  Any errors from dictation are purely unintentional

## 2017-04-04 ENCOUNTER — Encounter: Payer: Self-pay | Admitting: *Deleted

## 2017-04-22 ENCOUNTER — Other Ambulatory Visit: Payer: Self-pay | Admitting: Family Medicine

## 2017-05-11 ENCOUNTER — Other Ambulatory Visit: Payer: Self-pay | Admitting: Family Medicine

## 2017-07-11 ENCOUNTER — Ambulatory Visit
Admission: RE | Admit: 2017-07-11 | Discharge: 2017-07-11 | Disposition: A | Discharge status: Home / Self Care | Payer: Medicare Other | Source: Ambulatory Visit | Attending: Family Medicine | Admitting: Family Medicine

## 2017-07-11 ENCOUNTER — Encounter: Payer: Self-pay | Admitting: Family Medicine

## 2017-07-11 ENCOUNTER — Ambulatory Visit (INDEPENDENT_AMBULATORY_CARE_PROVIDER_SITE_OTHER): Payer: Medicare Other | Admitting: Family Medicine

## 2017-07-11 VITALS — BP 110/78 | HR 90 | Temp 97.9°F | Resp 16 | Ht 67.0 in | Wt 176.4 lb

## 2017-07-11 DIAGNOSIS — G8929 Other chronic pain: Secondary | ICD-10-CM | POA: Insufficient documentation

## 2017-07-11 DIAGNOSIS — E119 Type 2 diabetes mellitus without complications: Secondary | ICD-10-CM

## 2017-07-11 DIAGNOSIS — R0781 Pleurodynia: Secondary | ICD-10-CM

## 2017-07-11 DIAGNOSIS — E782 Mixed hyperlipidemia: Secondary | ICD-10-CM

## 2017-07-11 DIAGNOSIS — Z5181 Encounter for therapeutic drug level monitoring: Secondary | ICD-10-CM | POA: Diagnosis not present

## 2017-07-11 DIAGNOSIS — M19012 Primary osteoarthritis, left shoulder: Secondary | ICD-10-CM | POA: Diagnosis not present

## 2017-07-11 DIAGNOSIS — M25512 Pain in left shoulder: Secondary | ICD-10-CM

## 2017-07-11 DIAGNOSIS — I6529 Occlusion and stenosis of unspecified carotid artery: Secondary | ICD-10-CM

## 2017-07-11 DIAGNOSIS — S4992XA Unspecified injury of left shoulder and upper arm, initial encounter: Secondary | ICD-10-CM | POA: Diagnosis not present

## 2017-07-11 DIAGNOSIS — I10 Essential (primary) hypertension: Secondary | ICD-10-CM

## 2017-07-11 DIAGNOSIS — S299XXA Unspecified injury of thorax, initial encounter: Secondary | ICD-10-CM | POA: Diagnosis not present

## 2017-07-11 NOTE — Assessment & Plan Note (Signed)
Goal LDL less than 70; cannot tolerate statins

## 2017-07-11 NOTE — Assessment & Plan Note (Signed)
Check liver and kidney 

## 2017-07-11 NOTE — Assessment & Plan Note (Signed)
Check labs, foot exam by MD today; eye exam in the last year

## 2017-07-11 NOTE — Patient Instructions (Signed)
You can have the xrays done across the street You can get your labs at [facility] If you have not heard anything from my staff in a week about any orders/referrals/studies from today, please contact us here to follow-up [phone] Try to follow the DASH guidelines (DASH stands for Dietary Approaches to Stop Hypertension). Try to limit the sodium in your diet to no more than 1,500mg  of sodium per day. Certainly try to not exceed 2,000 mg per day at the very most. Do not add salt when cooking or at the table.  Check the sodium amount on labels when shopping, and choose items lower in sodium when given a choice. Avoid or limit foods that already contain a lot of sodium. Eat a diet rich in fruits and vegetables and whole grains, and try to lose weight if overweight or obese

## 2017-07-11 NOTE — Assessment & Plan Note (Signed)
Well-controlled, try to watch salt in foods; try th DASH guidelines

## 2017-07-11 NOTE — Assessment & Plan Note (Signed)
Changing his diet; check lipids today; cannot tolerate statins

## 2017-07-11 NOTE — Progress Notes (Signed)
BP 110/78   Pulse 90   Temp 97.9 F (36.6 C) (Oral)   Resp 16   Ht 5\' 7"  (1.702 m)   Wt 176 lb 6.4 oz (80 kg)   SpO2 94%   BMI 27.63 kg/m    Subjective:    Patient ID: Gerald Bauer, male    DOB: 01-Oct-1945, 72 y.o.   MRN: [id number]  HPI: Gerald Bauer is a 72 y.o. male  Chief Complaint  Patient presents with  . Follow-up    pt states that this should be his annual exam.    HPI  He fell off of a scaffolding, not far, fell onto the back and shoulder; not immediate pain, but has a knot over the upper left chest, below collar bone; rull ROM  HTN; controlled; does not add extra salt; some foods already have the salt; more of a label reader since becoming a diabetic  Type 2 diabetes;  Lab Results  Component Value Date   HGBA1C 6.6 (H) 01/10/2017   Vascular specialist is Dr. Lucky Cowboy, getting carotid US every year, staying the same every year  High cholesterol; cannot tolerate statins; not interested in [place]; changed his diet a lot  Depression screen [facility] 2/9 07/11/2017 01/10/2017 08/01/2016 07/11/2016 07/11/2015  Decreased Interest 0 0 0 0 0  Down, Depressed, Hopeless 1 0 0 0 3  PHQ - 2 Score 1 0 0 0 3  Altered sleeping - - - - 0  Tired, decreased energy - - - - 0  Change in appetite - - - - 1  Feeling bad or failure about yourself  - - - - 0  Trouble concentrating - - - - 2  Moving slowly or fidgety/restless - - - - 0  Suicidal thoughts - - - - 0  PHQ-9 Score - - - - 6  Difficult doing work/chores - - - - Not difficult at all    Relevant past medical, surgical, family and social history reviewed Past Medical History:  Diagnosis Date  . Acid reflux   . Arthritis    hands  . Carotid atherosclerosis   . Diabetes mellitus without complication ([place])   . Hyperlipemia   . Hypertension   . Prostate cancer ([place])    history  . Tachycardia    Past Surgical History:  Procedure Laterality Date  . APPENDECTOMY  1973  . CHOLECYSTECTOMY  2013  . choly  2013  .  PROSTATECTOMY  2012   Family History  Problem Relation Age of Onset  . Dementia Mother   . High Cholesterol Mother   . Hypertension Mother   . Arthritis Mother   . Hyperlipidemia Mother   . Heart disease Father   . Heart attack Father   . Arthritis Maternal Grandmother   . Cancer Maternal Grandmother        colon  . Prostate cancer Brother   . Cancer Brother        prostate  . Cancer Maternal Uncle        colon  . COPD Maternal Uncle   . Stroke Paternal Grandmother   . Diabetes Paternal Grandmother   . Aneurysm Paternal Grandfather    Social History   Tobacco Use  . Smoking status: Former Smoker    Packs/day: 1.00    Years: 40.00    Pack years: 40.00    Types: Cigarettes    Last attempt to quit: 03/09/1997    Years since quitting: 20.3  . Smokeless tobacco:  Former Systems developer  . Tobacco comment: quit 1999  Substance Use Topics  . Alcohol use: No    Alcohol/week: 0.0 oz    Comment: quit 2014  . Drug use: No    Interim medical history since last visit reviewed. Allergies and medications reviewed  Review of Systems Per HPI unless specifically indicated above     Objective:    BP 110/78   Pulse 90   Temp 97.9 F (36.6 C) (Oral)   Resp 16   Ht 5\' 7"  (1.702 m)   Wt 176 lb 6.4 oz (80 kg)   SpO2 94%   BMI 27.63 kg/m   Wt Readings from Last 3 Encounters:  07/11/17 176 lb 6.4 oz (80 kg)  03/15/17 182 lb (82.6 kg)  01/10/17 182 lb 6.4 oz (82.7 kg)    Physical Exam  Constitutional: He appears well-developed and well-nourished. No distress.  HENT:  Head: Normocephalic and atraumatic.  Eyes: EOM are normal. No scleral icterus.  Neck: No thyromegaly present.  Cardiovascular: Normal rate and regular rhythm.  Pulmonary/Chest: Effort normal and breath sounds normal.  Tenderness over upper LEFT side chest wall; no palpable knot; no deformity; clavicle feels intact  Abdominal: Soft. Bowel sounds are normal. He exhibits no distension.  Musculoskeletal: He exhibits no  edema.       Left shoulder: He exhibits decreased range of motion (slightly decreased with abduction, reaching behind back). He exhibits no tenderness.  Neurological: Coordination normal.  Skin: Skin is warm and dry. No pallor.  Psychiatric: He has a normal mood and affect. His behavior is normal. Judgment and thought content normal. His mood appears not anxious. He does not exhibit a depressed mood.   Diabetic Foot Form - Detailed   Diabetic Foot Exam - detailed Diabetic Foot exam was performed with the following findings:  Yes 07/11/2017  8:33 AM  Visual Foot Exam completed.:  Yes  Pulse Foot Exam completed.:  Yes  Right Dorsalis Pedis:  Present Left Dorsalis Pedis:  Present  Sensory Foot Exam Completed.:  Yes Semmes-Weinstein Monofilament Test R Site 1-Great Toe:  Pos L Site 1-Great Toe:  Pos        Results for orders placed or performed in visit on 01/10/17  PSA  Result Value Ref Range   PSA <0.1 < OR = 4.0 ng/mL  COMPLETE METABOLIC PANEL WITH GFR  Result Value Ref Range   Glucose, Bld 125 (H) 65 - 99 mg/dL   BUN 12 7 - 25 mg/dL   Creat 0.84 0.70 - 1.18 mg/dL   GFR, Est Non African American 87 > OR = 60 mL/min/1.58m2   GFR, Est African American 101 > OR = 60 mL/min/1.71m2   BUN/Creatinine Ratio NOT APPLICABLE 6 - 22 (calc)   Sodium 137 135 - 146 mmol/L   Potassium 4.3 3.5 - 5.3 mmol/L   Chloride 103 98 - 110 mmol/L   CO2 27 20 - 32 mmol/L   Calcium 9.0 8.6 - 10.3 mg/dL   Total Protein 6.9 6.1 - 8.1 g/dL   Albumin 4.3 3.6 - 5.1 g/dL   Globulin 2.6 1.9 - 3.7 g/dL (calc)   AG Ratio 1.7 1.0 - 2.5 (calc)   Total Bilirubin 0.8 0.2 - 1.2 mg/dL   Alkaline phosphatase (APISO) 57 40 - 115 U/L   AST 13 10 - 35 U/L   ALT 17 9 - 46 U/L  Hemoglobin A1c  Result Value Ref Range   Hgb A1c MFr Bld 6.6 (H) <5.7 % of  total Hgb   Mean Plasma Glucose 143 (calc)   eAG (mmol/L) 7.9 (calc)  Lipid panel  Result Value Ref Range   Cholesterol 235 (H) <200 mg/dL   HDL 45 >40 mg/dL    Triglycerides 119 <150 mg/dL   LDL Cholesterol (Calc) 165 (H) mg/dL (calc)   Total CHOL/HDL Ratio 5.2 (H) <5.0 (calc)   Non-HDL Cholesterol (Calc) 190 (H) <130 mg/dL (calc)  Microalbumin / creatinine urine ratio  Result Value Ref Range   Creatinine, Urine 112 20 - 320 mg/dL   Microalb, Ur 0.4 mg/dL   Microalb Creat Ratio 4 <30 mcg/mg creat  TSH  Result Value Ref Range   TSH 2.11 0.40 - 4.50 mIU/L  VITAMIN D 25 Hydroxy (Vit-D Deficiency, Fractures)  Result Value Ref Range   Vit D, 25-Hydroxy 31 30 - 100 ng/mL      Assessment & Plan:   Problem List Items Addressed This Visit      Cardiovascular and Mediastinum   HTN (hypertension)    Well-controlled, try to watch salt in foods; try th DASH guidelines      Carotid atherosclerosis    Goal LDL less than 70; cannot tolerate statins      Relevant Orders   Lipid panel     Endocrine   Type 2 diabetes mellitus (HCC)    Check labs, foot exam by MD today; eye exam in the last year      Relevant Orders   Lipid panel   Hemoglobin A1c     Other   Medication monitoring encounter    Check liver and kidney      Relevant Orders   Comprehensive metabolic panel   Hyperlipidemia, unspecified    Changing his diet; check lipids today; cannot tolerate statins      Relevant Orders   Lipid panel    Other Visit Diagnoses    Chronic left shoulder pain    -  Primary   Relevant Orders   DG Shoulder Left   Rib pain on left side       Relevant Orders   DG Ribs Unilateral Left       Follow up plan: Return in about 6 months (around 01/10/2018) for follow-up visit with Dr. Sanda Klein.  An after-visit summary was printed and given to the patient at [facility].  Please see the patient instructions which may contain other information and recommendations beyond what is mentioned above in the assessment and plan.  No orders of the defined types were placed in this encounter.   Orders Placed This Encounter  Procedures  . DG Ribs Unilateral  Left  . DG Shoulder Left  . Lipid panel  . Comprehensive metabolic panel  . Hemoglobin A1c

## 2017-07-12 LAB — COMPREHENSIVE METABOLIC PANEL
A/G RATIO: 1.8 (ref 1.2–2.2)
ALT: 22 IU/L (ref 0–44)
AST: 20 IU/L (ref 0–40)
Albumin: 4.3 g/dL (ref 3.5–4.8)
Alkaline Phosphatase: 59 IU/L (ref 39–117)
BUN/Creatinine Ratio: 13 (ref 10–24)
BUN: 12 mg/dL (ref 8–27)
Bilirubin Total: 0.6 mg/dL (ref 0.0–1.2)
CO2: 25 mmol/L (ref 20–29)
Calcium: 9 mg/dL (ref 8.6–10.2)
Chloride: 103 mmol/L (ref 96–106)
Creatinine, Ser: 0.9 mg/dL (ref 0.76–1.27)
GFR, EST AFRICAN AMERICAN: 98 mL/min/{1.73_m2} (ref >59)
GFR, EST NON AFRICAN AMERICAN: 85 mL/min/{1.73_m2} (ref >59)
GLOBULIN, TOTAL: 2.4 g/dL (ref 1.5–4.5)
Glucose: 131 mg/dL — ABNORMAL HIGH (ref 65–99)
POTASSIUM: 4.8 mmol/L (ref 3.5–5.2)
Sodium: 141 mmol/L (ref 134–144)
TOTAL PROTEIN: 6.7 g/dL (ref 6.0–8.5)

## 2017-07-12 LAB — LIPID PANEL
CHOLESTEROL TOTAL: 241 mg/dL — AB (ref 100–199)
Chol/HDL Ratio: 4.6 ratio (ref 0.0–5.0)
HDL: 52 mg/dL (ref >39)
LDL Calculated: 170 mg/dL — ABNORMAL HIGH (ref 0–99)
TRIGLYCERIDES: 95 mg/dL (ref 0–149)
VLDL Cholesterol Cal: 19 mg/dL (ref 5–40)

## 2017-07-12 LAB — HEMOGLOBIN A1C
Est. average glucose Bld gHb Est-mCnc: 146 mg/dL
Hgb A1c MFr Bld: 6.7 % — ABNORMAL HIGH (ref 4.8–5.6)

## 2017-07-30 ENCOUNTER — Telehealth: Payer: Self-pay

## 2017-07-30 DIAGNOSIS — M898X9 Other specified disorders of bone, unspecified site: Secondary | ICD-10-CM

## 2017-07-30 NOTE — Telephone Encounter (Signed)
Told pt he would need to bring bill up here in order to refile

## 2017-07-30 NOTE — Telephone Encounter (Signed)
Copied from [facility] [id number]. Topic: Bill or Statement - Patient/Guarantor Inquiry >> Jul 30, 2017  9:42 AM Rutherford Nail, NT wrote: Patient calling and state that he received a bill from [facility] for Vitamin D3. He called his insurance and they informed him that it was billed/coded wrong. DOS: 01/18/2017 CB#: [id number]

## 2017-07-30 NOTE — Telephone Encounter (Signed)
Bone  Pain M85.80 Foot pain M79.672

## 2017-07-30 NOTE — Telephone Encounter (Signed)
Reviewed note from that visit; he had foot pain, bone pain Please submit new code to lab Orders Placed This Encounter  Procedures  . VITAMIN D 25 Hydroxy (Vit-D Deficiency, Fractures)    Standing Status:   Future    Standing Expiration Date:   07/31/2018   Encounter Diagnosis  Name Primary?  . Bone pain Yes

## 2017-09-03 ENCOUNTER — Ambulatory Visit (INDEPENDENT_AMBULATORY_CARE_PROVIDER_SITE_OTHER): Payer: Medicare Other

## 2017-09-03 VITALS — BP 120/70 | HR 94 | Temp 97.0°F | Resp 12 | Ht 67.0 in | Wt 180.4 lb

## 2017-09-03 DIAGNOSIS — Z0000 Encounter for general adult medical examination without abnormal findings: Secondary | ICD-10-CM

## 2017-09-03 DIAGNOSIS — Z1211 Encounter for screening for malignant neoplasm of colon: Secondary | ICD-10-CM

## 2017-09-03 NOTE — Progress Notes (Signed)
Subjective:   Gerald Bauer is a 72 y.o. male who presents for Medicare Annual/Subsequent preventive examination.  Review of Systems:  N/A Cardiac Risk Factors include: advanced age (>77men, >58 women);diabetes mellitus;hypertension;male gender;dyslipidemia;sedentary lifestyle     Objective:    Vitals: BP 120/70 (BP Location: Left Arm, Patient Position: Sitting, Cuff Size: Normal)   Pulse 94   Temp (!) 97 F (36.1 C) (Oral)   Resp 12   Ht 5\' 7"  (1.702 m)   Wt 180 lb 6.4 oz (81.8 kg)   SpO2 97%   BMI 28.25 kg/m   Body mass index is 28.25 kg/m.  Advanced Directives 09/03/2017 01/10/2017 08/01/2016 07/11/2016 03/13/2016 07/11/2015 03/09/2014  Does Patient Have a Medical Advance Directive? No No No No No No No  Would patient like information on creating a medical advance directive? Yes (MAU/Ambulatory/Procedural Areas - Information given) - No - Patient declined - - No - patient declined information Yes - Educational materials given    Tobacco Social History   Tobacco Use  Smoking Status Former Smoker  . Packs/day: 1.00  . Years: 40.00  . Pack years: 40.00  . Types: Cigarettes  . Last attempt to quit: 03/09/1997  . Years since quitting: 20.5  Smokeless Tobacco Former Systems developer  Tobacco Comment   smoking cessation materials not required     Counseling given: No Comment: smoking cessation materials not required  Clinical Intake:  Pre-visit preparation completed: Yes  Pain : No/denies pain   BMI - recorded: 28.25 Nutritional Status: BMI 25 -29 Overweight Nutritional Risks: None  Nutrition Risk Assessment: Has the patient had any N/V/D within the last 2 months?  No Does the patient have any non-healing wounds?  No Has the patient had any unintentional weight loss or weight gain?  No  Is the patient diabetic?  Yes If diabetic, was a CBG obtained today?  No Did the patient bring in their glucometer from home?  No Comments: Pt monitors CBG's daily. Denies any  financial strains with the device or supplies.  Diabetic Exams: Diabetic Eye Exam: Completed 10/18/16 Diabetic Foot Exam: Completed 07/11/17  How often do you need to have someone help you when you read instructions, pamphlets, or other written materials from your doctor or pharmacy?: 1 - Never  Interpreter Needed?: No  Information entered by :: Idell Pickles, LPN  Past Medical History:  Diagnosis Date  . Acid reflux   . Arthritis    hands  . Carotid atherosclerosis   . Diabetes mellitus without complication ([place])   . Hyperlipemia   . Hypertension   . Prostate cancer ([place])    history  . Tachycardia    Past Surgical History:  Procedure Laterality Date  . APPENDECTOMY  1973  . CHOLECYSTECTOMY  2013  . choly  2013  . PROSTATECTOMY  2012   Family History  Problem Relation Age of Onset  . Dementia Mother   . High Cholesterol Mother   . Hypertension Mother   . Arthritis Mother   . Hyperlipidemia Mother   . Heart disease Father   . Heart attack Father   . Arthritis Maternal Grandmother   . Cancer Maternal Grandmother        colon  . Prostate cancer Brother   . Cancer Brother        prostate  . Cancer Maternal Uncle        colon  . COPD Maternal Uncle   . Stroke Paternal Grandmother   . Diabetes Paternal Grandmother   .  Aneurysm Paternal Grandfather    Social History   Socioeconomic History  . Marital status: Widowed    Spouse name: Regino Schultze  . Number of children: 2  . Years of education: some college  . Highest education level: 12th grade  Occupational History  . Occupation: Retired    Comment: [employer]  Social Needs  . Financial resource strain: Not hard at all  . Food insecurity:    Worry: Never true    Inability: Never true  . Transportation needs:    Medical: No    Non-medical: No  Tobacco Use  . Smoking status: Former Smoker    Packs/day: 1.00    Years: 40.00    Pack years: 40.00    Types: Cigarettes    Last attempt to quit: 03/09/1997    Years  since quitting: 20.5  . Smokeless tobacco: Former Systems developer  . Tobacco comment: smoking cessation materials not required  Substance and Sexual Activity  . Alcohol use: No    Alcohol/week: 0.0 oz  . Drug use: No  . Sexual activity: Never  Lifestyle  . Physical activity:    Days per week: 0 days    Minutes per session: 0 min  . Stress: Not at all  Relationships  . Social connections:    Talks on phone: Patient refused    Gets together: Patient refused    Attends religious service: Patient refused    Active member of club or organization: Patient refused    Attends meetings of clubs or organizations: Patient refused    Relationship status: Widowed  Other Topics Concern  . Not on file  Social History Narrative  . Not on file    Outpatient Encounter Medications as of 09/03/2017  Medication Sig  . amLODipine (NORVASC) 5 MG tablet TAKE 1 AND 1/2 TABLETS BY  MOUTH DAILY  . aspirin 81 MG tablet Take 81 mg by mouth daily.  . cholecalciferol (VITAMIN D) 1000 units tablet Take 1 tablet (1,000 Units total) by mouth daily.  . Cyanocobalamin (VITAMIN B-12 CR PO) Take 1,000 mcg by mouth daily.   Marland Kitchen glucose blood (ONETOUCH VERIO) test strip Check fingerstick blood sugars once a day; Dx E11.65; LON 99 months  . Omega-3 Fatty Acids (FISH OIL) 1200 MG CAPS Take 1 capsule by mouth daily.   Glory Rosebush DELICA LANCETS 06C MISC CHECK BLOOD SUGAR ONCE A DAY AS DIRECTED; Dx E11.9, LON 99 months  . ranitidine (ZANTAC) 150 MG tablet TAKE 1 TABLET BY MOUTH TWO  TIMES DAILY   Facility-Administered Encounter Medications as of 09/03/2017  Medication  . betamethasone acetate-betamethasone sodium phosphate (CELESTONE) injection 3 mg    Activities of Daily Living In your present state of health, do you have any difficulty performing the following activities: 09/03/2017 07/11/2017  Hearing? N Y  Comment denies hearing aids -  Vision? N N  Comment wears eyeglasses -  Difficulty concentrating or making decisions? Y  N  Comment short term memory loss -  Walking or climbing stairs? N N  Dressing or bathing? N N  Doing errands, shopping? N N  Preparing Food and eating ? N -  Comment lower partial dentures -  Using the Toilet? N -  In the past six months, have you accidently leaked urine? Y -  Comment urgency -  Do you have problems with loss of bowel control? N -  Managing your Medications? N -  Managing your Finances? N -  Housekeeping or managing your Housekeeping? N -  Some  recent data might be hidden    Patient Care Team: Lada, Satira Anis, MD as PCP - General (Family Medicine) [name], MD as Referring Physician (Vascular Surgery) [name], MD as Consulting Physician (Gastroenterology)   Assessment:   This is a routine wellness examination for Muhammed.  Exercise Activities and Dietary recommendations Current Exercise Habits: The patient does not participate in regular exercise at present, Exercise limited by: None identified  Goals    . DIET - INCREASE WATER INTAKE     Recommend to drink at least 6-8 8oz glasses of water per day.       Fall Risk Fall Risk  09/03/2017 07/11/2017 01/10/2017 08/30/2016 08/23/2016  Falls in the past year? Yes Yes No (No Data) No  Comment - - - no falls since previous visit -  Number falls in past yr: 1 2 or more - - -  Injury with Fall? Yes No - - -  Comment L shoulder pain - - - -  Risk for fall due to : Impaired vision;History of fall(s);Medication side effect - - - -  Follow up Falls evaluation completed;Education provided;Falls prevention discussed - - - -   FALL RISK PREVENTION PERTAINING TO HOME: Is your home free of loose throw rugs in walkways, pet beds, electrical cords, etc? Yes Is there adequate lighting in your home to reduce risk of falls?  Yes Are there stairs in or around your home WITH handrails? Yes  ASSISTIVE DEVICES UTILIZED TO PREVENT FALLS: Use of a cane, walker or w/c? No Grab bars in the bathroom? Yes  Shower chair or a  place to sit while bathing? Yes An elevated toilet seat or a handicapped toilet? Yes  Timed Get Up and Go Performed: Yes. Pt ambulated 10 feet within 5 sec. Gait stead-fast and without the use of an assistive device. No intervention required at this time. Fall risk prevention has been discussed.  Community Resource Referral:  [facility] Referral not required at this time.   Depression Screen PHQ 2/9 Scores 09/03/2017 07/11/2017 01/10/2017 08/01/2016  PHQ - 2 Score 0 1 0 0  PHQ- 9 Score 0 - - -    Cognitive Function MMSE - Mini Mental State Exam 03/09/2014  Orientation to time 5  Orientation to Place 4  Registration 3  Attention/ Calculation 5  Recall 2  Language- name 2 objects 2  Language- repeat 1  Language- follow 3 step command 3  Language- read & follow direction 1  Write a sentence 1  Copy design 1  Total score 28     6CIT Screen 09/03/2017 07/11/2016  What Year? 0 points 0 points  What month? 0 points 0 points  What time? 0 points 0 points  Count back from 20 0 points 0 points  Months in reverse 0 points 0 points  Repeat phrase 2 points 4 points  Total Score 2 4    Immunization History  Administered Date(s) Administered  . Influenza, High Dose Seasonal PF 01/10/2017  . Influenza-Unspecified 01/10/2012, 01/01/2013, 01/01/2015  . Pneumococcal Conjugate-13 07/30/2013  . Pneumococcal Polysaccharide-23 02/27/2011  . Td 03/04/2014  . Zoster 02/17/2014  . Zoster Recombinat (Shingrix) 01/31/2017    Qualifies for Shingles Vaccine? Yes. Due for final dose of Shingrix.   Screening Tests Health Maintenance  Topic Date Due  . OPHTHALMOLOGY EXAM  10/18/2017  . INFLUENZA VACCINE  10/24/2017  . HEMOGLOBIN A1C  01/10/2018  . URINE MICROALBUMIN  01/10/2018  . FOOT EXAM  07/12/2018  .  COLONOSCOPY  10/01/2018  . TETANUS/TDAP  03/04/2024  . Hepatitis C Screening  Completed  . PNA vac Low Risk Adult  Completed   Cancer Screenings: Lung: Low Dose CT Chest  recommended if Age 16-80 years, 30 pack-year currently smoking OR have quit w/in 15years. Patient does not qualify. Colorectal: Completed 09/30/13. Repeat every 5 years. GI referral placed. Pt aware that he will receive a call from our office re: his appt. Verbalized acceptance and understanding.  Additional Screenings: Hepatitis C Screening: Completed 05/09/15     Plan:  I have personally reviewed and addressed the Medicare Annual Wellness questionnaire and have noted the following in the patient's chart:  A. Medical and social history B. Use of alcohol, tobacco or illicit drugs  C. Current medications and supplements D. Functional ability and status E.  Nutritional status F.  Physical activity G. Advance directives H. List of other physicians I.  Hospitalizations, surgeries, and ER visits in previous 12 months J.  [place] such as hearing and vision if needed, cognitive and depression L. Referrals and appointments  In addition, I have reviewed and discussed with patient certain preventive protocols, quality metrics, and best practice recommendations. A written personalized care plan for preventive services as well as general preventive health recommendations were provided to patient.  See attached scanned questionnaire for additional information.   Signed,  Aleatha Borer, LPN Nurse Health Advisor

## 2017-09-03 NOTE — Patient Instructions (Signed)
[name] , Thank you for taking time to come for your Medicare Wellness Visit. I appreciate your ongoing commitment to your health goals. Please review the following plan we discussed and let me know if I can assist you in the future.   Screening recommendations/referrals: Colorectal Screening: You will receive a call from our office regarding your appointment Lung Cancer Screening: You do not qualify for this screening Hepatitis C Screening: Up to date  Vision and Dental Exams: Recommended annual ophthalmology exams for early detection of glaucoma and other disorders of the eye Recommended annual dental exams for proper oral hygiene  Diabetic Exams: Recommended annual diabetic eye exams for early detection of retinopathy Recommended annual diabetic foot exams for early detection of peripheral neuropathy.  Diabetic Eye Exam: Please schedule an appointment with your ophthalmologist Diabetic Foot Exam: Up to date  Vaccinations: Influenza vaccine: Up to date Pneumococcal vaccine: Up to date Tdap vaccine: Up to date Shingles vaccine: Due for final dose of Shingrix    Advanced directives: Advance directive discussed with you today. I have provided a copy for you to complete at home and have notarized. Once this is complete please bring a copy in to our office so we can scan it into your chart.  Conditions/risks identified: Recommend to drink at least 6-8 8oz glasses of water per day.  Next appointment: Please schedule your Annual Wellness Visit with your Nurse Health Advisor in one year.  Preventive Care 68 Years and Older, Male Preventive care refers to lifestyle choices and visits with your health care provider that can promote health and wellness. What does preventive care include?  A yearly physical exam. This is also called an annual well check.  Dental exams once or twice a year.  Routine eye exams. Ask your health care provider how often you should have your eyes  checked.  Personal lifestyle choices, including:  Daily care of your teeth and gums.  Regular physical activity.  Eating a healthy diet.  Avoiding tobacco and drug use.  Limiting alcohol use.  Practicing safe sex.  Taking low doses of aspirin every day.  Taking vitamin and mineral supplements as recommended by your health care provider. What happens during an annual well check? The services and screenings done by your health care provider during your annual well check will depend on your age, overall health, lifestyle risk factors, and family history of disease. Counseling  Your health care provider may ask you questions about your:  Alcohol use.  Tobacco use.  Drug use.  Emotional well-being.  Home and relationship well-being.  Sexual activity.  Eating habits.  History of falls.  Memory and ability to understand (cognition).  Work and work Statistician. Screening  You may have the following tests or measurements:  Height, weight, and BMI.  Blood pressure.  Lipid and cholesterol levels. These may be checked every 5 years, or more frequently if you are over 106 years old.  Skin check.  Lung cancer screening. You may have this screening every year starting at age 52 if you have a 30-pack-year history of smoking and currently smoke or have quit within the past 15 years.  Fecal occult blood test (FOBT) of the stool. You may have this test every year starting at age 27.  Flexible sigmoidoscopy or colonoscopy. You may have a sigmoidoscopy every 5 years or a colonoscopy every 10 years starting at age 20.  Prostate cancer screening. Recommendations will vary depending on your family history and other risks.  Hepatitis  C blood test.  Hepatitis B blood test.  Sexually transmitted disease (STD) testing.  Diabetes screening. This is done by checking your blood sugar (glucose) after you have not eaten for a while (fasting). You may have this done every 1-3  years.  Abdominal aortic aneurysm (AAA) screening. You may need this if you are a current or former smoker.  Osteoporosis. You may be screened starting at age 32 if you are at high risk. Talk with your health care provider about your test results, treatment options, and if necessary, the need for more tests. Vaccines  Your health care provider may recommend certain vaccines, such as:  Influenza vaccine. This is recommended every year.  Tetanus, diphtheria, and acellular pertussis (Tdap, Td) vaccine. You may need a Td booster every 10 years.  Zoster vaccine. You may need this after age 82.  Pneumococcal 13-valent conjugate (PCV13) vaccine. One dose is recommended after age 31.  Pneumococcal polysaccharide (PPSV23) vaccine. One dose is recommended after age 34. Talk to your health care provider about which screenings and vaccines you need and how often you need them. This information is not intended to replace advice given to you by your health care provider. Make sure you discuss any questions you have with your health care provider. Document Released: 04/08/2015 Document Revised: 11/30/2015 Document Reviewed: 01/11/2015 Elsevier Interactive Patient Education  2017 [place] Prevention in the Home Falls can cause injuries. They can happen to people of all ages. There are many things you can do to make your home safe and to help prevent falls. What can I do on the outside of my home?  Regularly fix the edges of walkways and driveways and fix any cracks.  Remove anything that might make you trip as you walk through a door, such as a raised step or threshold.  Trim any bushes or trees on the path to your home.  Use bright outdoor lighting.  Clear any walking paths of anything that might make someone trip, such as rocks or tools.  Regularly check to see if handrails are loose or broken. Make sure that both sides of any steps have handrails.  Any raised decks and porches  should have guardrails on the edges.  Have any leaves, snow, or ice cleared regularly.  Use sand or salt on walking paths during winter.  Clean up any spills in your garage right away. This includes oil or grease spills. What can I do in the bathroom?  Use night lights.  Install grab bars by the toilet and in the tub and shower. Do not use towel bars as grab bars.  Use non-skid mats or decals in the tub or shower.  If you need to sit down in the shower, use a plastic, non-slip stool.  Keep the floor dry. Clean up any water that spills on the floor as soon as it happens.  Remove soap buildup in the tub or shower regularly.  Attach bath mats securely with double-sided non-slip rug tape.  Do not have throw rugs and other things on the floor that can make you trip. What can I do in the bedroom?  Use night lights.  Make sure that you have a light by your bed that is easy to reach.  Do not use any sheets or blankets that are too big for your bed. They should not hang down onto the floor.  Have a firm chair that has side arms. You can use this for support while you get  dressed.  Do not have throw rugs and other things on the floor that can make you trip. What can I do in the kitchen?  Clean up any spills right away.  Avoid walking on wet floors.  Keep items that you use a lot in easy-to-reach places.  If you need to reach something above you, use a strong step stool that has a grab bar.  Keep electrical cords out of the way.  Do not use floor polish or wax that makes floors slippery. If you must use wax, use non-skid floor wax.  Do not have throw rugs and other things on the floor that can make you trip. What can I do with my stairs?  Do not leave any items on the stairs.  Make sure that there are handrails on both sides of the stairs and use them. Fix handrails that are broken or loose. Make sure that handrails are as long as the stairways.  Check any carpeting to  make sure that it is firmly attached to the stairs. Fix any carpet that is loose or worn.  Avoid having throw rugs at the top or bottom of the stairs. If you do have throw rugs, attach them to the floor with carpet tape.  Make sure that you have a light switch at the top of the stairs and the bottom of the stairs. If you do not have them, ask someone to add them for you. What else can I do to help prevent falls?  Wear shoes that:  Do not have high heels.  Have rubber bottoms.  Are comfortable and fit you well.  Are closed at the toe. Do not wear sandals.  If you use a stepladder:  Make sure that it is fully opened. Do not climb a closed stepladder.  Make sure that both sides of the stepladder are locked into place.  Ask someone to hold it for you, if possible.  Clearly mark and make sure that you can see:  Any grab bars or handrails.  First and last steps.  Where the edge of each step is.  Use tools that help you move around (mobility aids) if they are needed. These include:  Canes.  Walkers.  Scooters.  Crutches.  Turn on the lights when you go into a dark area. Replace any light bulbs as soon as they burn out.  Set up your furniture so you have a clear path. Avoid moving your furniture around.  If any of your floors are uneven, fix them.  If there are any pets around you, be aware of where they are.  Review your medicines with your doctor. Some medicines can make you feel dizzy. This can increase your chance of falling. Ask your doctor what other things that you can do to help prevent falls. This information is not intended to replace advice given to you by your health care provider. Make sure you discuss any questions you have with your health care provider. Document Released: 01/06/2009 Document Revised: 08/18/2015 Document Reviewed: 04/16/2014 Elsevier Interactive Patient Education  2017 [employer].

## 2018-01-13 ENCOUNTER — Encounter: Payer: Self-pay | Admitting: Family Medicine

## 2018-01-13 ENCOUNTER — Ambulatory Visit (INDEPENDENT_AMBULATORY_CARE_PROVIDER_SITE_OTHER): Payer: Medicare Other | Admitting: Family Medicine

## 2018-01-13 VITALS — BP 130/82 | HR 94 | Temp 98.2°F | Ht 67.0 in | Wt 182.0 lb

## 2018-01-13 DIAGNOSIS — B351 Tinea unguium: Secondary | ICD-10-CM

## 2018-01-13 DIAGNOSIS — I10 Essential (primary) hypertension: Secondary | ICD-10-CM | POA: Diagnosis not present

## 2018-01-13 DIAGNOSIS — E1165 Type 2 diabetes mellitus with hyperglycemia: Secondary | ICD-10-CM

## 2018-01-13 DIAGNOSIS — I6529 Occlusion and stenosis of unspecified carotid artery: Secondary | ICD-10-CM

## 2018-01-13 DIAGNOSIS — K219 Gastro-esophageal reflux disease without esophagitis: Secondary | ICD-10-CM

## 2018-01-13 DIAGNOSIS — E559 Vitamin D deficiency, unspecified: Secondary | ICD-10-CM | POA: Diagnosis not present

## 2018-01-13 DIAGNOSIS — E119 Type 2 diabetes mellitus without complications: Secondary | ICD-10-CM | POA: Diagnosis not present

## 2018-01-13 DIAGNOSIS — Z23 Encounter for immunization: Secondary | ICD-10-CM

## 2018-01-13 DIAGNOSIS — Z5181 Encounter for therapeutic drug level monitoring: Secondary | ICD-10-CM | POA: Diagnosis not present

## 2018-01-13 DIAGNOSIS — E782 Mixed hyperlipidemia: Secondary | ICD-10-CM | POA: Diagnosis not present

## 2018-01-13 DIAGNOSIS — E1169 Type 2 diabetes mellitus with other specified complication: Secondary | ICD-10-CM

## 2018-01-13 MED ORDER — ONETOUCH DELICA LANCETS 33G MISC: 3 refills | Status: DC

## 2018-01-13 MED ORDER — FAMOTIDINE 20 MG PO TABS: 20.0000 mg | ORAL_TABLET | Freq: Two times a day (BID) | ORAL | 3 refills | Status: DC

## 2018-01-13 MED ORDER — GLUCOSE BLOOD VI STRP: ORAL_STRIP | 3 refills | Status: DC

## 2018-01-13 NOTE — Assessment & Plan Note (Addendum)
Foot exam by MD today; check A1c and urine microalb:Cr; avoiding sugary drinks, white bread

## 2018-01-13 NOTE — Patient Instructions (Signed)
Check out the information at familydoctor.org entitled "Nutrition for Weight Loss: What You Need to Know about Fad Diets" Try to lose between 1/2 to 1 pounds per week by taking in fewer calories and burning off more calories You can succeed by limiting portions, limiting foods dense in calories and fat, becoming more active, and drinking 8 glasses of water a day (64 ounces) Don't skip meals, especially breakfast, as skipping meals may alter your metabolism Do not use over-the-counter weight loss pills or gimmicks that claim rapid weight loss A healthy BMI (or body mass index) is between 18.5 and 24.9 You can calculate your ideal BMI at the [place] website ClubMonetize.fr  Please do see your eye doctor regularly, and have your eyes examined every year (or more often per his or her recommendation) Check your feet every night and let me know right away of any sores, infections, numbness, etc. Try to limit sweets, white bread, white rice, white potatoes  Try to follow the DASH guidelines (DASH stands for Dietary Approaches to Stop Hypertension). Try to limit the sodium in your diet to no more than 1,500mg  of sodium per day. Certainly try to not exceed 2,000 mg per day at the very most. Do not add salt when cooking or at the table.  Check the sodium amount on labels when shopping, and choose items lower in sodium when given a choice. Avoid or limit foods that already contain a lot of sodium. Eat a diet rich in fruits and vegetables and whole grains, and try to lose weight if overweight or obese  Please have labs done today

## 2018-01-13 NOTE — Assessment & Plan Note (Signed)
Switch H2 blocker due to news reports of ranitidine

## 2018-01-13 NOTE — Assessment & Plan Note (Signed)
Lipid lowering therapy has been difficult; does not tolerate statins, will not use repatha

## 2018-01-13 NOTE — Progress Notes (Signed)
BP 130/82   Pulse 94   Temp 98.2 F (36.8 C)   Ht 5\' 7"  (1.702 m)   Wt 182 lb (82.6 kg)   SpO2 98%   BMI 28.51 kg/m    Subjective:    Patient ID: Gerald Bauer, male    DOB: [birth year], 72 y.o.   MRN: [id number]  HPI: Gerald Bauer is a 72 y.o. male  Chief Complaint  Patient presents with  . Follow-up    HPI Patient is here for f/u  Type 2 diabetes mellitus; FSBS 165 this morning; has been a little off schedule this week FSBS every day, but may miss a few days; most of the time around 145 No dry mouth or blurred vision; some numbness in the feet Avoiding sugary drinks except for 50/50 sweet tea  Lab Results  Component Value Date   HGBA1C 6.7 (H) 07/11/2017    High cholesterol; room for improvement with fatty meats; likes cheese, slice every 2-3 days; no eggs in the last month, then two yesterday; bread is whole wheat, nature's own; cannot tolerate statins or zetia, or welchol  Lab Results  Component Value Date   CHOL 241 (H) 07/11/2017   HDL 52 07/11/2017   LDLCALC 170 (H) 07/11/2017   TRIG 95 07/11/2017   CHOLHDL 4.6 07/11/2017    High blood pressure Controlled today off of meds for 2 days; just forgot, no cost issues or side effects Asked about amlodipine, we looked at cancer risk in the news No added salt  Vit D deficiency; check today; not as much sun as before, but some sun exposure  Carotid athero; managed by Dr. Lucky Cowboy  Depression screen [facility] 2/9 01/13/2018 09/03/2017 07/11/2017 01/10/2017 08/01/2016  Decreased Interest 0 0 0 0 0  Down, Depressed, Hopeless 0 0 1 0 0  PHQ - 2 Score 0 0 1 0 0  Altered sleeping 0 0 - - -  Tired, decreased energy 0 0 - - -  Change in appetite 0 0 - - -  Feeling bad or failure about yourself  0 0 - - -  Trouble concentrating 0 0 - - -  Moving slowly or fidgety/restless 0 0 - - -  Suicidal thoughts 0 0 - - -  PHQ-9 Score 0 0 - - -  Difficult doing work/chores - Not difficult at all - - -   Fall Risk  01/13/2018  09/03/2017 07/11/2017 01/10/2017 08/30/2016  Falls in the past year? Yes Yes Yes No (No Data)  Comment - - - - no falls since previous visit  Number falls in past yr: 1 1 2  or more - -  Injury with Fall? Yes Yes No - -  Comment - L shoulder pain - - -  Risk for fall due to : - Impaired vision;History of fall(s);Medication side effect - - -  Follow up - Falls evaluation completed;Education provided;Falls prevention discussed - - -    Relevant past medical, surgical, family and social history reviewed Past Medical History:  Diagnosis Date  . Acid reflux   . Arthritis    hands  . Carotid atherosclerosis   . Diabetes mellitus without complication ([place])   . Hyperlipemia   . Hypertension   . Prostate cancer ([place])    history  . Tachycardia    Past Surgical History:  Procedure Laterality Date  . APPENDECTOMY  1973  . CHOLECYSTECTOMY  2013  . choly  2013  . PROSTATECTOMY  2012   Family History  Problem Relation Age of Onset  . Dementia Mother   . High Cholesterol Mother   . Hypertension Mother   . Arthritis Mother   . Hyperlipidemia Mother   . Heart disease Father   . Heart attack Father   . Arthritis Maternal Grandmother   . Cancer Maternal Grandmother        colon  . Prostate cancer Brother   . Cancer Brother        prostate  . Cancer Maternal Uncle        colon  . COPD Maternal Uncle   . Stroke Paternal Grandmother   . Diabetes Paternal Grandmother   . Aneurysm Paternal Grandfather    Social History   Tobacco Use  . Smoking status: Former Smoker    Packs/day: 1.00    Years: 40.00    Pack years: 40.00    Types: Cigarettes    Last attempt to quit: 03/09/1997    Years since quitting: 20.8  . Smokeless tobacco: Former Systems developer  . Tobacco comment: smoking cessation materials not required  Substance Use Topics  . Alcohol use: No    Alcohol/week: 0.0 standard drinks  . Drug use: No     Office Visit from 01/13/2018 in [facility]  AUDIT-C Score  0        Interim medical history since last visit reviewed. Allergies and medications reviewed  Review of Systems Per HPI unless specifically indicated above     Objective:    BP 130/82   Pulse 94   Temp 98.2 F (36.8 C)   Ht 5\' 7"  (1.702 m)   Wt 182 lb (82.6 kg)   SpO2 98%   BMI 28.51 kg/m   Wt Readings from Last 3 Encounters:  01/13/18 182 lb (82.6 kg)  09/03/17 180 lb 6.4 oz (81.8 kg)  07/11/17 176 lb 6.4 oz (80 kg)    Physical Exam  Constitutional: He appears well-developed and well-nourished. No distress.  HENT:  Head: Normocephalic and atraumatic.  Eyes: EOM are normal. No scleral icterus.  Neck: No thyromegaly present.  Cardiovascular: Normal rate and regular rhythm.  Pulmonary/Chest: Effort normal and breath sounds normal.  Abdominal: Soft. Bowel sounds are normal. He exhibits no distension.  Musculoskeletal: He exhibits no edema.  Neurological: Coordination normal.  Skin: Skin is warm and dry. No pallor.  Psychiatric: He has a normal mood and affect. His behavior is normal. Judgment and thought content normal.   Diabetic Foot Form - Detailed   Diabetic Foot Exam - detailed Diabetic Foot exam was performed with the following findings:  Yes 01/13/2018  9:33 AM  Visual Foot Exam completed.:  Yes  Pulse Foot Exam completed.:  Yes  Right Dorsalis Pedis:  Present Left Dorsalis Pedis:  Present  Sensory Foot Exam Completed.:  Yes Semmes-Weinstein Monofilament Test R Site 1-Great Toe:  Pos L Site 1-Great Toe:  Pos        Results for orders placed or performed in visit on 07/11/17  Lipid panel  Result Value Ref Range   Cholesterol, Total 241 (H) 100 - 199 mg/dL   Triglycerides 95 0 - 149 mg/dL   HDL 52 >39 mg/dL   VLDL Cholesterol Cal 19 5 - 40 mg/dL   LDL Calculated 170 (H) 0 - 99 mg/dL   Chol/HDL Ratio 4.6 0.0 - 5.0 ratio  Comprehensive metabolic panel  Result Value Ref Range   Glucose 131 (H) 65 - 99 mg/dL   BUN 12 8 - 27 mg/dL  Creatinine, Ser 0.90  0.76 - 1.27 mg/dL   GFR calc non Af Amer 85 >59 mL/min/1.73   GFR calc Af Amer 98 >59 mL/min/1.73   BUN/Creatinine Ratio 13 10 - 24   Sodium 141 134 - 144 mmol/L   Potassium 4.8 3.5 - 5.2 mmol/L   Chloride 103 96 - 106 mmol/L   CO2 25 20 - 29 mmol/L   Calcium 9.0 8.6 - 10.2 mg/dL   Total Protein 6.7 6.0 - 8.5 g/dL   Albumin 4.3 3.5 - 4.8 g/dL   Globulin, Total 2.4 1.5 - 4.5 g/dL   Albumin/Globulin Ratio 1.8 1.2 - 2.2   Bilirubin Total 0.6 0.0 - 1.2 mg/dL   Alkaline Phosphatase 59 39 - 117 IU/L   AST 20 0 - 40 IU/L   ALT 22 0 - 44 IU/L  Hemoglobin A1c  Result Value Ref Range   Hgb A1c MFr Bld 6.7 (H) 4.8 - 5.6 %   Est. 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 ([place]) - Primary    Foot exam by MD today; check A1c and urine microalb:Cr; avoiding sugary drinks, white bread      Relevant Medications   glucose blood (ONETOUCH VERIO) test strip     Other   Medication monitoring encounter   Relevant Orders   Comprehensive metabolic panel   Hyperlipidemia, unspecified    Lipid lowering therapy has been difficult; does not tolerate statins, will not use repatha      Relevant Orders   Lipid panel    Other Visit Diagnoses    Vitamin D deficiency       Relevant Orders   VITAMIN D 25 Hydroxy (Vit-D Deficiency, Fractures)   Need for influenza vaccination       Relevant Orders     Flu vaccine HIGH DOSE PF (Fluzone High dose) (Completed)       Follow up plan: Return in about 6 months (around 07/15/2018) for follow-up visit with Dr. Sanda Klein.  An after-visit summary was printed and given to the patient at [place].  Please see the patient instructions which may contain other information and recommendations beyond what is mentioned above in the assessment and plan.  Meds ordered this encounter  Medications  . famotidine (PEPCID) 20 MG tablet    Sig: Take 1 tablet (20 mg total) by mouth 2 (two) times daily. For heartburn    Dispense:  180 tablet    Refill:  3  . glucose blood (ONETOUCH VERIO) test strip    Sig: Check fingerstick blood sugars once a day; Dx E11.65; LON 99 months    Dispense:  100 each    Refill:  3  . ONETOUCH DELICA LANCETS 23F MISC    Sig: CHECK BLOOD SUGAR ONCE A DAY AS DIRECTED; Dx  E11.9, LON 99 months    Dispense:  100 each    Refill:  3    Orders Placed This Encounter  Procedures  . Flu vaccine HIGH DOSE PF (Fluzone High dose)  . Hemoglobin A1c  . Comprehensive metabolic panel  . Lipid panel  . Microalbumin / creatinine urine ratio  . VITAMIN D 25 Hydroxy (Vit-D Deficiency, Fractures)

## 2018-01-13 NOTE — Assessment & Plan Note (Signed)
Managed by vascular; he does not tolerate statins, unfortunately, and will not take zetia or welcohol either

## 2018-01-13 NOTE — Assessment & Plan Note (Signed)
Continue meds, try to follow DASH guidelines

## 2018-01-14 LAB — COMPREHENSIVE METABOLIC PANEL
A/G RATIO: 1.6 (ref 1.2–2.2)
ALT: 15 IU/L (ref 0–44)
AST: 15 IU/L (ref 0–40)
Albumin: 4.2 g/dL (ref 3.5–4.8)
Alkaline Phosphatase: 61 IU/L (ref 39–117)
BUN/Creatinine Ratio: 13 (ref 10–24)
BUN: 12 mg/dL (ref 8–27)
Bilirubin Total: 0.5 mg/dL (ref 0.0–1.2)
CALCIUM: 8.9 mg/dL (ref 8.6–10.2)
CO2: 23 mmol/L (ref 20–29)
Chloride: 102 mmol/L (ref 96–106)
Creatinine, Ser: 0.91 mg/dL (ref 0.76–1.27)
GFR, EST AFRICAN AMERICAN: 96 mL/min/{1.73_m2} (ref >59)
GFR, EST NON AFRICAN AMERICAN: 83 mL/min/{1.73_m2} (ref >59)
Globulin, Total: 2.6 g/dL (ref 1.5–4.5)
Glucose: 150 mg/dL — ABNORMAL HIGH (ref 65–99)
POTASSIUM: 4.4 mmol/L (ref 3.5–5.2)
Sodium: 139 mmol/L (ref 134–144)
TOTAL PROTEIN: 6.8 g/dL (ref 6.0–8.5)

## 2018-01-14 LAB — LIPID PANEL
CHOL/HDL RATIO: 6 ratio — AB (ref 0.0–5.0)
Cholesterol, Total: 253 mg/dL — ABNORMAL HIGH (ref 100–199)
HDL: 42 mg/dL (ref >39)
LDL Calculated: 186 mg/dL — ABNORMAL HIGH (ref 0–99)
Triglycerides: 126 mg/dL (ref 0–149)
VLDL CHOLESTEROL CAL: 25 mg/dL (ref 5–40)

## 2018-01-14 LAB — MICROALBUMIN / CREATININE URINE RATIO
Creatinine, Urine: 279.7 mg/dL
MICROALB/CREAT RATIO: 6.5 mg/g{creat} (ref 0.0–30.0)
Microalbumin, Urine: 18.1 ug/mL

## 2018-01-14 LAB — VITAMIN D 25 HYDROXY (VIT D DEFICIENCY, FRACTURES): Vit D, 25-Hydroxy: 38.3 ng/mL (ref 30.0–100.0)

## 2018-01-14 LAB — HEMOGLOBIN A1C
ESTIMATED AVERAGE GLUCOSE: 148 mg/dL
Hgb A1c MFr Bld: 6.8 % — ABNORMAL HIGH (ref 4.8–5.6)

## 2018-02-28 ENCOUNTER — Other Ambulatory Visit: Payer: Self-pay | Admitting: Family Medicine

## 2018-02-28 NOTE — Telephone Encounter (Signed)
Rx approved

## 2018-03-14 ENCOUNTER — Ambulatory Visit (INDEPENDENT_AMBULATORY_CARE_PROVIDER_SITE_OTHER): Payer: Medicare Other

## 2018-03-14 ENCOUNTER — Ambulatory Visit (INDEPENDENT_AMBULATORY_CARE_PROVIDER_SITE_OTHER): Payer: Medicare Other | Admitting: Vascular Surgery

## 2018-03-14 ENCOUNTER — Encounter (INDEPENDENT_AMBULATORY_CARE_PROVIDER_SITE_OTHER): Payer: Self-pay | Admitting: Vascular Surgery

## 2018-03-14 VITALS — BP 124/80 | HR 101 | Resp 12 | Ht 67.0 in | Wt 181.8 lb

## 2018-03-14 DIAGNOSIS — I10 Essential (primary) hypertension: Secondary | ICD-10-CM

## 2018-03-14 DIAGNOSIS — I6523 Occlusion and stenosis of bilateral carotid arteries: Secondary | ICD-10-CM

## 2018-03-14 DIAGNOSIS — E782 Mixed hyperlipidemia: Secondary | ICD-10-CM | POA: Diagnosis not present

## 2018-03-14 NOTE — Assessment & Plan Note (Signed)
Carotid duplex today reveals stable 1 to 39% stenosis in the right carotid artery and 40 to 59% carotid artery stenosis on the left. Continue current medical regimen.  Recheck in 1 year.

## 2018-03-14 NOTE — Progress Notes (Signed)
MRN : [id number]  Gerald Bauer is a 72 y.o. (Mar 04, 1946) male who presents with chief complaint of  Chief Complaint  Patient presents with  . Follow-up  .  History of Present Illness: Patient returns in follow-up of his carotid disease.  No new focal neurologic symptoms. Specifically, the patient denies amaurosis fugax, speech or swallowing difficulties, or arm or leg weakness or numbness.  No other new complaints today.  Carotid duplex today reveals stable 1 to 39% stenosis in the right carotid artery and 40 to 59% carotid artery stenosis on the left.  Current Outpatient Medications  Medication Sig Dispense Refill  . amLODipine (NORVASC) 5 MG tablet TAKE 1 AND 1/2 TABLETS BY  MOUTH DAILY 135 tablet 3  . aspirin 81 MG tablet Take 81 mg by mouth daily.    . cholecalciferol (VITAMIN D) 1000 units tablet Take 1 tablet (1,000 Units total) by mouth daily. 90 tablet 3  . Cyanocobalamin (VITAMIN B-12 CR PO) Take 1,000 mcg by mouth daily.     . famotidine (PEPCID) 20 MG tablet Take 1 tablet (20 mg total) by mouth 2 (two) times daily. For heartburn 180 tablet 3  . glucose blood (ONETOUCH VERIO) test strip Check fingerstick blood sugars once a day; Dx E11.65; LON 99 months 100 each 3  . Omega-3 Fatty Acids (FISH OIL) 1200 MG CAPS Take 1 capsule by mouth daily.     Glory Rosebush DELICA LANCETS 99M MISC CHECK BLOOD SUGAR ONCE A DAY AS DIRECTED; Dx E11.9, LON 99 months 100 each 3   Current Facility-Administered Medications  Medication Dose Route Frequency Provider Last Rate Last Dose  . betamethasone acetate-betamethasone sodium phosphate (CELESTONE) injection 3 mg  3 mg Intramuscular Once Edrick Kins, DPM        Past Medical History:  Diagnosis Date  . Acid reflux   . Arthritis    hands  . Carotid atherosclerosis   . Diabetes mellitus without complication ([place])   . Hyperlipemia   . Hypertension   . Prostate cancer ([place])    history  . Tachycardia     Past Surgical History:    Procedure Laterality Date  . APPENDECTOMY  1973  . CHOLECYSTECTOMY  2013  . choly  2013  . PROSTATECTOMY  2012   Social History  Substance Use Topics  . Smoking status: Former Smoker    Packs/day: 1.00    Years: 40.00    Types: Cigarettes    Quit date: 03/09/1997  . Smokeless tobacco: Former Systems developer     Comment: quit 1999  . Alcohol use No     Comment: quit 2014    Family History       Family History  Problem Relation Age of Onset  . Dementia Mother   . High Cholesterol Mother   . Hypertension Mother   . Arthritis Mother   . Hyperlipidemia Mother   . Arthritis Maternal Grandmother   . Cancer Maternal Grandmother     colon  . Heart disease Father   . Heart attack Father   . Prostate cancer Brother   . Cancer Brother     prostate  . Cancer Maternal Uncle     colon  . COPD Maternal Uncle   . Stroke Paternal Grandmother   . Diabetes Neg Hx         Allergies  Allergen Reactions  . Amoxicillin Swelling  . Welchol [Colesevelam Hcl]      REVIEW OF SYSTEMS(Negative unless checked)  Constitutional: [] ?  Weight loss[] ?Fever[] ?Chills Cardiac:[] ?Chest pain[] ?Chest pressure[] ?Palpitations [] ?Shortness of breath when laying flat [] ?Shortness of breath at rest [] ?Shortness of breath with exertion. Vascular: [] ?Pain in legs with walking[] ?Pain in legsat rest[] ?Pain in legs when laying flat [] ?Claudication [] ?Pain in feet when walking [] ?Pain in feet at rest [] ?Pain in feet when laying flat [] ?History of DVT [] ?Phlebitis [] ?Swelling in legs [] ?Varicose veins [] ?Non-healing ulcers Pulmonary: [] ?Uses home oxygen [] ?Productive cough[] ?Hemoptysis [] ?Wheeze [] ?COPD [] ?Asthma Neurologic: [] ?Dizziness [] ?Blackouts [] ?Seizures [] ?History of stroke [] ?History of TIA[] ?Aphasia [] ?Temporary blindness[] ?Dysphagia [] ?Weaknessor numbness in arms [] ?Weakness or  numbnessin legs Musculoskeletal: [x] ?Arthritis [] ?Joint swelling [] ?Joint pain [] ?Low back pain Hematologic:[] ?Easy bruising[] ?Easy bleeding [] ?Hypercoagulable state [] ?Anemic [] ?Hepatitis Gastrointestinal:[] ?Blood in stool[] ?Vomiting blood[] ?Gastroesophageal reflux/heartburn[] ?Difficulty swallowing. Genitourinary: [] ?Chronic kidney disease [] ?Difficulturination [] ?Frequenturination [] ?Burning with urination[] ?Blood in urine Skin: [] ?Rashes [] ?Ulcers [] ?Wounds Psychological: [] ?History of anxiety[] ?History of major depression.     Physical Examination  Vitals:   03/14/18 1358  BP: 124/80  Pulse: (!) 101  Resp: 12  Weight: 181 lb 12.8 oz (82.5 kg)  Height: 5\' 7"  (1.702 m)   Body mass index is 28.47 kg/m. Gen:  WD/WN, NAD Head: [place]/AT, No temporalis wasting. Ear/Nose/Throat: Hearing grossly intact, nares w/o erythema or drainage, trachea midline Eyes: Conjunctiva clear. Sclera non-icteric Neck: Supple.  no bruit  Pulmonary:  Good air movement, equal and clear to auscultation bilaterally.  Cardiac: RRR, No JVD Vascular:  Vessel Right Left  Radial Palpable Palpable                                   Gastrointestinal: soft, non-tender/non-distended. No guarding/reflex.  Musculoskeletal: M/S 5/5 throughout.  No deformity or atrophy. no edema. Neurologic: CN 2-12 intact. Sensation grossly intact in extremities.  Symmetrical.  Speech is fluent. Motor exam as listed above. Psychiatric: Judgment intact, Mood & affect appropriate for pt's clinical situation. Dermatologic: No rashes or ulcers noted.  No cellulitis or open wounds.      CBC Lab Results  Component Value Date   WBC 7.2 07/11/2016   HGB 16.2 07/11/2016   HCT 46.8 07/11/2016   MCV 89.3 07/11/2016   PLT 266 07/11/2016    BMET    Component Value Date/Time   NA 139 01/13/2018 0941   K 4.4 01/13/2018 0941   CL 102 01/13/2018 0941   CO2 23 01/13/2018 0941    GLUCOSE 150 (H) 01/13/2018 0941   GLUCOSE 125 (H) 01/10/2017 0913   BUN 12 01/13/2018 0941   CREATININE 0.91 01/13/2018 0941   CREATININE 0.84 01/10/2017 0913   CALCIUM 8.9 01/13/2018 0941   GFRNONAA 83 01/13/2018 0941   GFRNONAA 87 01/10/2017 0913   GFRAA 96 01/13/2018 0941   GFRAA 101 01/10/2017 0913   CrCl cannot be calculated (Patient's most recent lab result is older than the maximum 21 days allowed.).  COAG No results found for: INR, PROTIME  Radiology No results found.    Assessment/Plan Hyperlipemia lipid control important in reducing the progression of atherosclerotic disease.Has intolerance to statins despite trying multiple of them, and that is why he is not currently on a statin.  Hypertension blood pressure control important in reducing the progression of atherosclerotic disease. On appropriate oral medications.  Carotid stenosis Carotid duplex today reveals stable 1 to 39% stenosis in the right carotid artery and 40 to 59% carotid artery stenosis on the left. Continue current medical regimen.  Recheck in 1 year.    Leotis Pain, MD  03/14/2018 2:54 PM    This note was  created with Dragon medical transcription system.  Any errors from dictation are purely unintentional

## 2018-05-02 IMAGING — CR DG SHOULDER 2+V*L*
1 series · 3 of 3 positions shown · non-contrast
Comparison: None

CLINICAL DATA: Fell off a scaffold in [REDACTED], persistent upper
anterior LEFT rib and lateral LEFT shoulder pain

EXAM:
LEFT SHOULDER - 2+ VIEW

[Series 1: dg shoulder left · 0.14mm/px · 3 of 3 slices shown]
[im 1/3]
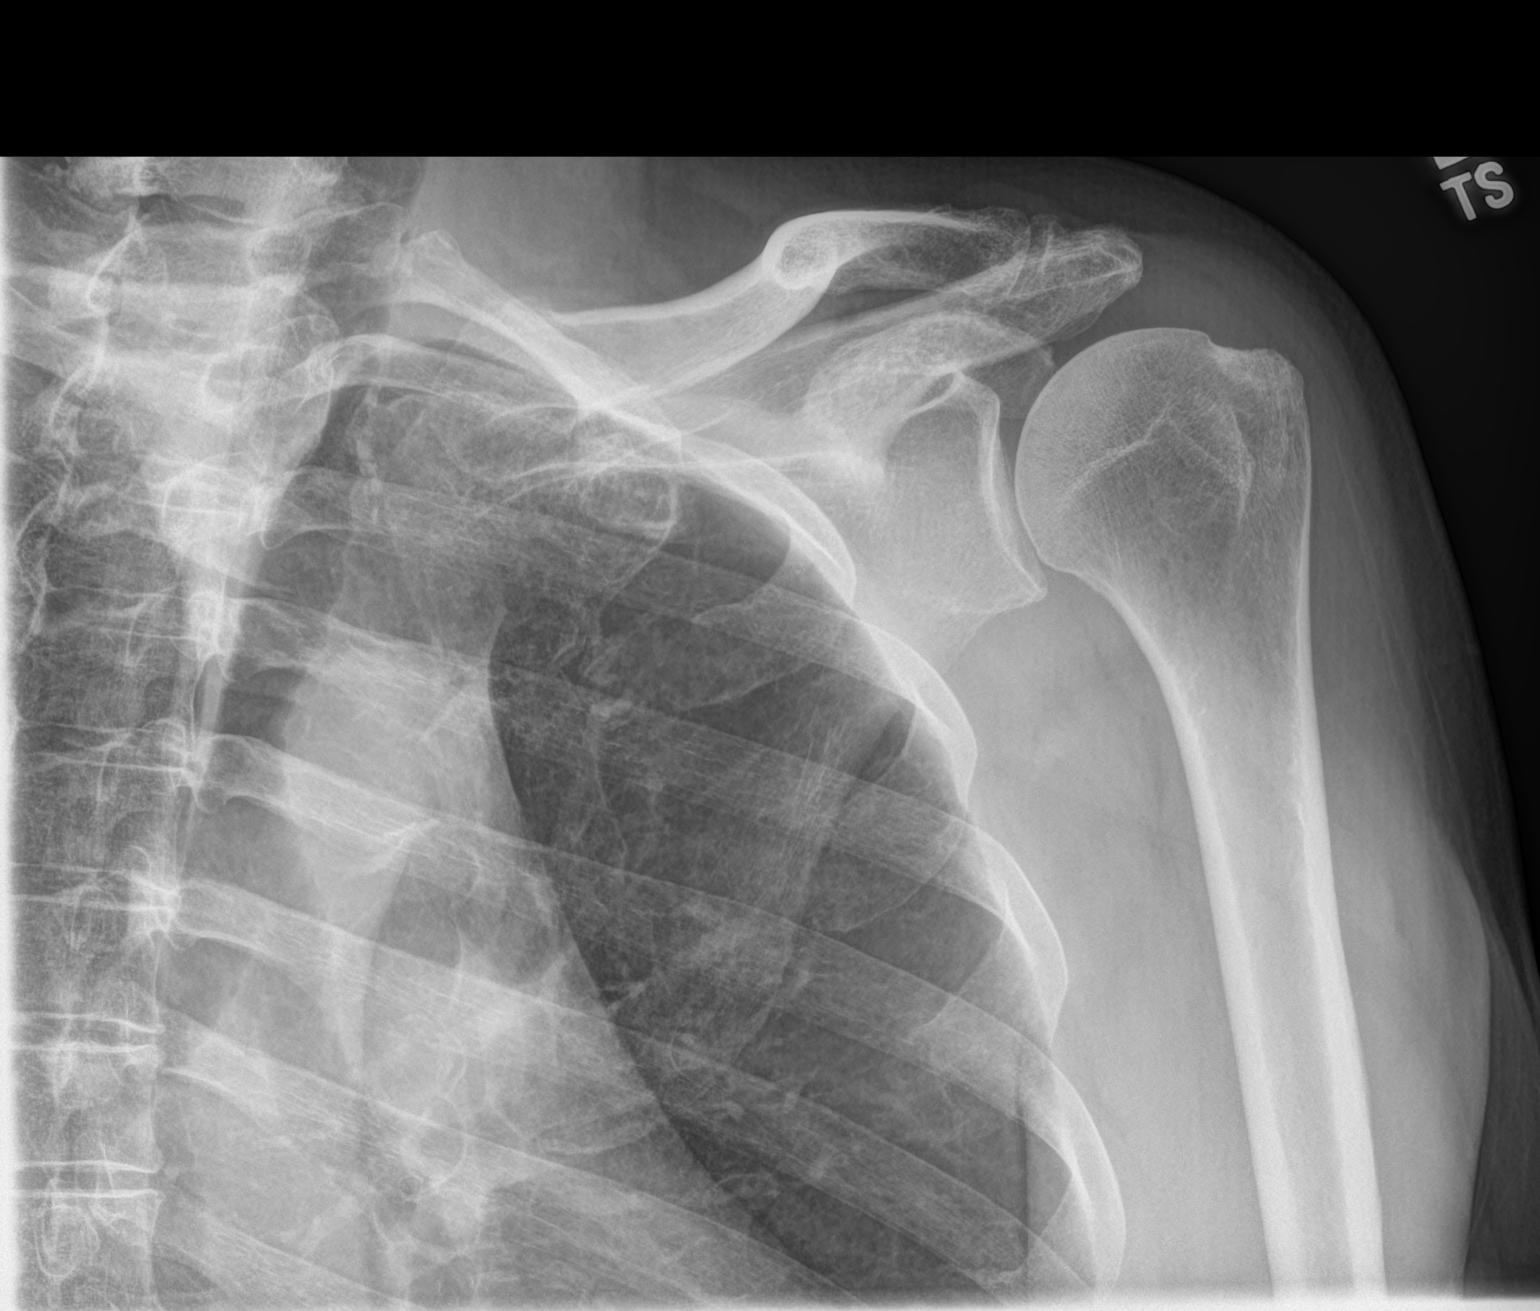
[im 2/3]
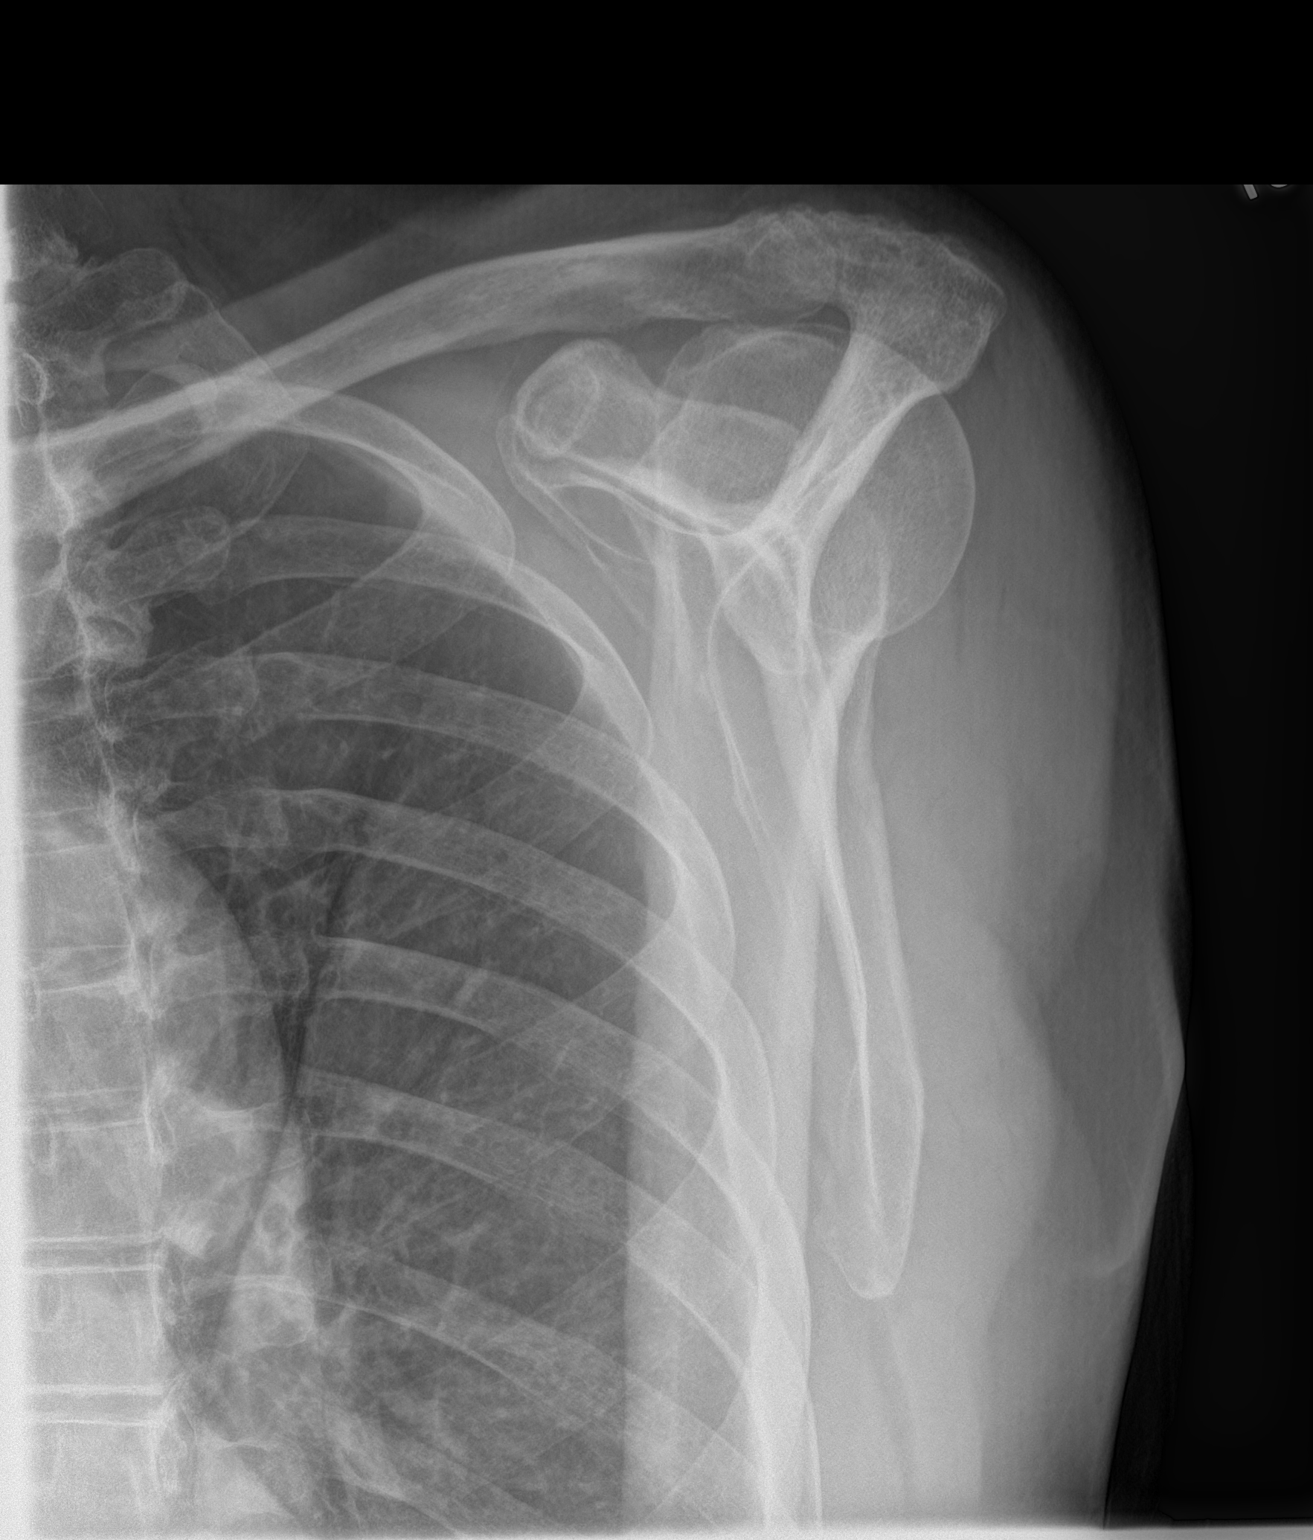
[im 3/3]
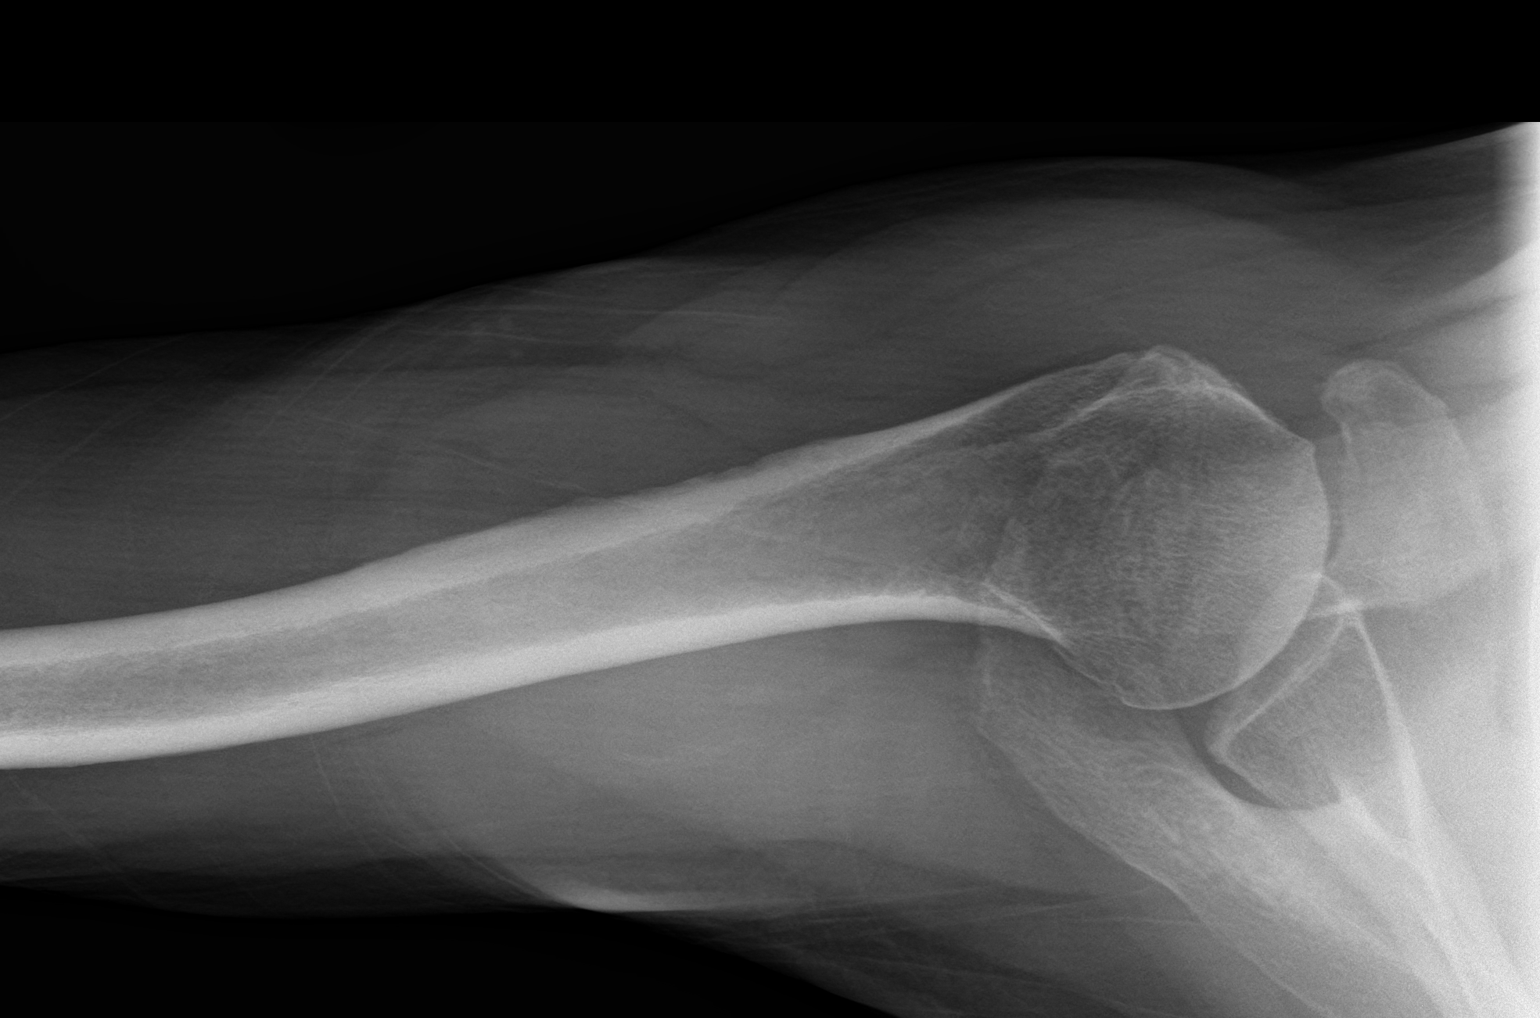

[3 of 3 positions shown; findings below may reference images not displayed]

FINDINGS: Osseous mineralization normal.

Mild degenerative changes at LEFT AC joint.

No acute fracture, dislocation, or bone destruction.

Visualized LEFT ribs unremarkable.
IMPRESSION: No acute abnormalities.

Mild degenerative changes LEFT AC joint.

## 2018-07-15 ENCOUNTER — Encounter: Payer: Self-pay | Admitting: Family Medicine

## 2018-07-15 ENCOUNTER — Other Ambulatory Visit: Payer: Self-pay

## 2018-07-15 ENCOUNTER — Ambulatory Visit (INDEPENDENT_AMBULATORY_CARE_PROVIDER_SITE_OTHER): Payer: Medicare Other | Admitting: Family Medicine

## 2018-07-15 VITALS — BP 135/95 | HR 95

## 2018-07-15 DIAGNOSIS — E782 Mixed hyperlipidemia: Secondary | ICD-10-CM

## 2018-07-15 DIAGNOSIS — Z5181 Encounter for therapeutic drug level monitoring: Secondary | ICD-10-CM | POA: Diagnosis not present

## 2018-07-15 DIAGNOSIS — I10 Essential (primary) hypertension: Secondary | ICD-10-CM

## 2018-07-15 DIAGNOSIS — B351 Tinea unguium: Secondary | ICD-10-CM

## 2018-07-15 DIAGNOSIS — I6529 Occlusion and stenosis of unspecified carotid artery: Secondary | ICD-10-CM

## 2018-07-15 DIAGNOSIS — E785 Hyperlipidemia, unspecified: Secondary | ICD-10-CM

## 2018-07-15 DIAGNOSIS — E1169 Type 2 diabetes mellitus with other specified complication: Secondary | ICD-10-CM

## 2018-07-15 NOTE — Assessment & Plan Note (Signed)
Patient is not interested in the injectable PCSK-9 inhibitor; encouraged lower fat diet and walking

## 2018-07-15 NOTE — Assessment & Plan Note (Signed)
Managed by vascular doctor 

## 2018-07-15 NOTE — Assessment & Plan Note (Signed)
Patient and I discussed waiting another month for labs; given andemic and high risk for MHWKG-88 complications; he is trying to avoid exposures; checking FSBS; try to walk more

## 2018-07-15 NOTE — Progress Notes (Signed)
BP (!) 135/95 (BP Location: Left Arm, Patient Position: Sitting, Cuff Size: Normal)   Pulse 95    Subjective:    Patient ID: Gerald Bauer, male    DOB: [birth year], 73 y.o.   MRN: [id number]  HPI: Gerald Bauer is a 73 y.o. male  Chief Complaint  Patient presents with  . Follow-up    HPI Virtual Visit via Telephone/Video Note   I connected with the patient via:  doximity video I verified that I am speaking with the correct person using two identifiers.  Call started: 8:35 am Call terminated: 8:52 am Total length of call: 17 minutes   I discussed the limitations, risks, and privacy concerns of performing an evaluation and management service by telephone and the availability of in-person appointments. I explained that he/she may be responsible for charges related to this service. The patient expressed understanding and agreed to proceed.  Provider location: home, upstairs office with door closed, earphones/headset on Patient location: home Additional participants: no one  No known exposures to COVID-19; he has only been out 3 times in the 3-4 weeks He knows about the sx of COVID-19 and is at high risk  Hypertension; checked BP this morning and controlled; more salt intake with recent pandemic; no chest pain  High cholesterol; not doing the drive thru, but using [employer] sausage egg cheese bowl; he could try some oatmeal but not a fan; he had Cheerios and fruit cup (drained the juice) last night; not interested in cooking class or anything right now; not interested in injectable; tried welchol, statins, zetia  Lab Results  Component Value Date   CHOL 253 (H) 01/13/2018   HDL 42 01/13/2018   LDLCALC 186 (H) 01/13/2018   TRIG 126 01/13/2018   CHOLHDL 6.0 (H) 01/13/2018   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 Lucky Cowboy; it has stayed the same for three years  Depression screen [facility] 2/9 01/13/2018 09/03/2017 07/11/2017 01/10/2017 08/01/2016  Decreased Interest 0 0 0 0 0  Down, Depressed, Hopeless 0 0 1 0 0  PHQ - 2 Score 0 0 1 0 0  Altered sleeping 0 0 - - -  Tired, decreased energy 0 0 - - -  Change in appetite 0 0 - - -  Feeling bad or failure about yourself  0 0 - - -  Trouble concentrating 0 0 - - -  Moving slowly or fidgety/restless 0 0 - - -  Suicidal thoughts 0 0 - - -  PHQ-9 Score 0 0 - - -  Difficult doing work/chores - Not difficult at all - - -   Fall Risk  07/15/2018 01/13/2018 09/03/2017 07/11/2017 01/10/2017  Falls in the past year? 0 Yes Yes Yes No  Comment - - - - -  Number falls in past yr: - 1 1 2  or more -  Injury with Fall? - Yes Yes No -  Comment - - L shoulder pain - -  Risk for fall due to : - - Impaired vision;History of fall(s);Medication side effect - -  Follow up - - Falls evaluation completed;Education provided;Falls prevention discussed - -    Relevant  past medical, surgical, family and social history reviewed Past Medical History:  Diagnosis Date  . Acid reflux   . Arthritis    hands  . Carotid atherosclerosis   . Diabetes mellitus without complication ([place])   . Hyperlipemia   . Hypertension   . Prostate cancer ([place])    history  . Tachycardia    Past Surgical History:  Procedure Laterality Date  . APPENDECTOMY  1973  . CHOLECYSTECTOMY  2013  . choly  2013  . PROSTATECTOMY  2012   Family History  Problem Relation Age of Onset  . Dementia Mother   . High Cholesterol Mother   . Hypertension Mother   . Arthritis Mother   . Hyperlipidemia Mother   . Heart disease Father   . Heart attack Father   . Arthritis Maternal Grandmother   . Cancer Maternal Grandmother         colon  . Prostate cancer Brother   . Cancer Brother        prostate  . Cancer Maternal Uncle        colon  . COPD Maternal Uncle   . Stroke Paternal Grandmother   . Diabetes Paternal Grandmother   . Aneurysm Paternal Grandfather    Social History   Tobacco Use  . Smoking status: Former Smoker    Packs/day: 1.00    Years: 40.00    Pack years: 40.00    Types: Cigarettes    Last attempt to quit: 03/09/1997    Years since quitting: 21.3  . Smokeless tobacco: Former Systems developer  . Tobacco comment: smoking cessation materials not required  Substance Use Topics  . Alcohol use: No    Alcohol/week: 0.0 standard drinks  . Drug use: No     Office Visit from 07/15/2018 in [facility]  AUDIT-C Score  0      Interim medical history since last visit reviewed. Allergies and medications reviewed  Review of Systems Per HPI unless specifically indicated above     Objective:    BP (!) 135/95 (BP Location: Left Arm, Patient Position: Sitting, Cuff Size: Normal)   Pulse 95   Wt Readings from Last 3 Encounters:  03/14/18 181 lb 12.8 oz (82.5 kg)  01/13/18 182 lb (82.6 kg)  09/03/17 180 lb 6.4 oz (81.8 kg)    Physical Exam Constitutional:      General: He is not in acute distress. Eyes:     General: No scleral icterus. Pulmonary:     Effort: No tachypnea or respiratory distress.  Skin:    Coloration: Skin is not jaundiced or pale.  Neurological:     Mental Status: He is alert.  Psychiatric:        Mood and Affect: Mood is not anxious or depressed.        Speech: Speech is not rapid and pressured, delayed or slurred.        Behavior: Behavior normal.        Cognition and Memory: Cognition normal.     Results for orders placed or performed in visit on 01/13/18  Hemoglobin A1c  Result Value Ref Range   Hgb A1c MFr Bld 6.8 (H) 4.8 - 5.6 %   Est. average glucose Bld gHb Est-mCnc 148 mg/dL  Comprehensive metabolic panel  Result Value Ref Range   Glucose  150 (H) 65 - 99 mg/dL   BUN 12 8 - 27 mg/dL   Creatinine, Ser 0.91 0.76 - 1.27 mg/dL   GFR calc non  Af Amer 83 >59 mL/min/1.73   GFR calc Af Amer 96 >59 mL/min/1.73   BUN/Creatinine Ratio 13 10 - 24   Sodium 139 134 - 144 mmol/L   Potassium 4.4 3.5 - 5.2 mmol/L   Chloride 102 96 - 106 mmol/L   CO2 23 20 - 29 mmol/L   Calcium 8.9 8.6 - 10.2 mg/dL   Total Protein 6.8 6.0 - 8.5 g/dL   Albumin 4.2 3.5 - 4.8 g/dL   Globulin, Total 2.6 1.5 - 4.5 g/dL   Albumin/Globulin Ratio 1.6 1.2 - 2.2   Bilirubin Total 0.5 0.0 - 1.2 mg/dL   Alkaline Phosphatase 61 39 - 117 IU/L   AST 15 0 - 40 IU/L   ALT 15 0 - 44 IU/L  Lipid panel  Result Value Ref Range   Cholesterol, Total 253 (H) 100 - 199 mg/dL   Triglycerides 126 0 - 149 mg/dL   HDL 42 >39 mg/dL   VLDL Cholesterol Cal 25 5 - 40 mg/dL   LDL Calculated 186 (H) 0 - 99 mg/dL   Chol/HDL Ratio 6.0 (H) 0.0 - 5.0 ratio  Microalbumin / creatinine urine ratio  Result Value Ref Range   Creatinine, Urine 279.7 Not Estab. mg/dL   Microalbumin, Urine 18.1 Not Estab. 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 ([place])    Patient and I discussed waiting another month for labs; given andemic and high risk for AQTMA-26 complications; he is trying to avoid exposures; checking FSBS; try to walk more        Other   Medication monitoring encounter - Primary   Relevant Orders   COMPLETE METABOLIC PANEL WITH GFR   Hyperlipidemia, unspecified    Patient is not  interested in the injectable PCSK-9 inhibitor; encouraged lower fat diet and walking      Relevant Orders   Lipid panel    Other Visit Diagnoses    Hyperlipidemia associated with type 2 diabetes mellitus ([place])       Relevant Orders   Microalbumin / creatinine urine ratio   Lipid panel   Hemoglobin A1c      Follow up plan: Return in about 6 months (around 01/14/2019) for follow-up visit with Dr. Sanda Klein.  No orders of the defined types were placed in this encounter.   Orders Placed This Encounter  Procedures  . Microalbumin / creatinine urine ratio  . Lipid panel  . Hemoglobin A1c  . COMPLETE METABOLIC PANEL WITH GFR

## 2018-07-15 NOTE — Assessment & Plan Note (Signed)
Under fair control; continue medicine; try to limit salt

## 2018-09-09 ENCOUNTER — Ambulatory Visit (INDEPENDENT_AMBULATORY_CARE_PROVIDER_SITE_OTHER): Payer: Medicare Other

## 2018-09-09 VITALS — BP 138/89 | HR 81 | Temp 97.5°F | Ht 67.0 in | Wt 178.0 lb

## 2018-09-09 DIAGNOSIS — Z0000 Encounter for general adult medical examination without abnormal findings: Secondary | ICD-10-CM

## 2018-09-09 DIAGNOSIS — Z1211 Encounter for screening for malignant neoplasm of colon: Secondary | ICD-10-CM | POA: Diagnosis not present

## 2018-09-09 NOTE — Patient Instructions (Addendum)
Gerald Bauer , Thank you for taking time to come for your Medicare Wellness Visit. I appreciate your ongoing commitment to your health goals. Please review the following plan we discussed and let me know if I can assist you in the future.   Screening recommendations/referrals: Colonoscopy: done 2015. Referred to [facility] gastroenterology for repeat screening.  Recommended yearly ophthalmology/optometry visit for glaucoma screening and checkup. Please call [facility] at [phone] to schedule an appointment.  Recommended yearly dental visit for hygiene and checkup  Vaccinations: Influenza vaccine: done 01/13/18 Pneumococcal vaccine: done 07/30/13 Tdap vaccine: done 03/04/14 Shingles vaccine: Shingrix series completed 01/31/17    Advanced directives: Please bring a copy of your health care power of attorney and living will to the office at your convenience once you have completed those documents.   Conditions/risks identified: recommend increasing physical activity to 150 minutes per week  Next appointment: Please follow up in one year for your Medicare Annual Wellness visit.    Preventive Care 74 Years and Older, Male Preventive care refers to lifestyle choices and visits with your health care provider that can promote health and wellness. What does preventive care include?  A yearly physical exam. This is also called an annual well check.  Dental exams once or twice a year.  Routine eye exams. Ask your health care provider how often you should have your eyes checked.  Personal lifestyle choices, including:  Daily care of your teeth and gums.  Regular physical activity.  Eating a healthy diet.  Avoiding tobacco and drug use.  Limiting alcohol use.  Practicing safe sex.  Taking low doses of aspirin every day.  Taking vitamin and mineral supplements as recommended by your health care provider. What happens during an annual well check? The services and screenings done  by your health care provider during your annual well check will depend on your age, overall health, lifestyle risk factors, and family history of disease. Counseling  Your health care provider may ask you questions about your:  Alcohol use.  Tobacco use.  Drug use.  Emotional well-being.  Home and relationship well-being.  Sexual activity.  Eating habits.  History of falls.  Memory and ability to understand (cognition).  Work and work Statistician. Screening  You may have the following tests or measurements:  Height, weight, and BMI.  Blood pressure.  Lipid and cholesterol levels. These may be checked every 5 years, or more frequently if you are over 82 years old.  Skin check.  Lung cancer screening. You may have this screening every year starting at age 58 if you have a 30-pack-year history of smoking and currently smoke or have quit within the past 15 years.  Fecal occult blood test (FOBT) of the stool. You may have this test every year starting at age 54.  Flexible sigmoidoscopy or colonoscopy. You may have a sigmoidoscopy every 5 years or a colonoscopy every 10 years starting at age 32.  Prostate cancer screening. Recommendations will vary depending on your family history and other risks.  Hepatitis C blood test.  Hepatitis B blood test.  Sexually transmitted disease (STD) testing.  Diabetes screening. This is done by checking your blood sugar (glucose) after you have not eaten for a while (fasting). You may have this done every 1-3 years.  Abdominal aortic aneurysm (AAA) screening. You may need this if you are a current or former smoker.  Osteoporosis. You may be screened starting at age 78 if you are at high risk. Talk with  your health care provider about your test results, treatment options, and if necessary, the need for more tests. Vaccines  Your health care provider may recommend certain vaccines, such as:  Influenza vaccine. This is recommended every  year.  Tetanus, diphtheria, and acellular pertussis (Tdap, Td) vaccine. You may need a Td booster every 10 years.  Zoster vaccine. You may need this after age 4.  Pneumococcal 13-valent conjugate (PCV13) vaccine. One dose is recommended after age 94.  Pneumococcal polysaccharide (PPSV23) vaccine. One dose is recommended after age 1. Talk to your health care provider about which screenings and vaccines you need and how often you need them. This information is not intended to replace advice given to you by your health care provider. Make sure you discuss any questions you have with your health care provider. Document Released: 04/08/2015 Document Revised: 11/30/2015 Document Reviewed: 01/11/2015 Elsevier Interactive Patient Education  2017 [place] Prevention in the Home Falls can cause injuries. They can happen to people of all ages. There are many things you can do to make your home safe and to help prevent falls. What can I do on the outside of my home?  Regularly fix the edges of walkways and driveways and fix any cracks.  Remove anything that might make you trip as you walk through a door, such as a raised step or threshold.  Trim any bushes or trees on the path to your home.  Use bright outdoor lighting.  Clear any walking paths of anything that might make someone trip, such as rocks or tools.  Regularly check to see if handrails are loose or broken. Make sure that both sides of any steps have handrails.  Any raised decks and porches should have guardrails on the edges.  Have any leaves, snow, or ice cleared regularly.  Use sand or salt on walking paths during winter.  Clean up any spills in your garage right away. This includes oil or grease spills. What can I do in the bathroom?  Use night lights.  Install grab bars by the toilet and in the tub and shower. Do not use towel bars as grab bars.  Use non-skid mats or decals in the tub or shower.  If you  need to sit down in the shower, use a plastic, non-slip stool.  Keep the floor dry. Clean up any water that spills on the floor as soon as it happens.  Remove soap buildup in the tub or shower regularly.  Attach bath mats securely with double-sided non-slip rug tape.  Do not have throw rugs and other things on the floor that can make you trip. What can I do in the bedroom?  Use night lights.  Make sure that you have a light by your bed that is easy to reach.  Do not use any sheets or blankets that are too big for your bed. They should not hang down onto the floor.  Have a firm chair that has side arms. You can use this for support while you get dressed.  Do not have throw rugs and other things on the floor that can make you trip. What can I do in the kitchen?  Clean up any spills right away.  Avoid walking on wet floors.  Keep items that you use a lot in easy-to-reach places.  If you need to reach something above you, use a strong step stool that has a grab bar.  Keep electrical cords out of the way.  Do not  use floor polish or wax that makes floors slippery. If you must use wax, use non-skid floor wax.  Do not have throw rugs and other things on the floor that can make you trip. What can I do with my stairs?  Do not leave any items on the stairs.  Make sure that there are handrails on both sides of the stairs and use them. Fix handrails that are broken or loose. Make sure that handrails are as long as the stairways.  Check any carpeting to make sure that it is firmly attached to the stairs. Fix any carpet that is loose or worn.  Avoid having throw rugs at the top or bottom of the stairs. If you do have throw rugs, attach them to the floor with carpet tape.  Make sure that you have a light switch at the top of the stairs and the bottom of the stairs. If you do not have them, ask someone to add them for you. What else can I do to help prevent falls?  Wear shoes that:   Do not have high heels.  Have rubber bottoms.  Are comfortable and fit you well.  Are closed at the toe. Do not wear sandals.  If you use a stepladder:  Make sure that it is fully opened. Do not climb a closed stepladder.  Make sure that both sides of the stepladder are locked into place.  Ask someone to hold it for you, if possible.  Clearly mark and make sure that you can see:  Any grab bars or handrails.  First and last steps.  Where the edge of each step is.  Use tools that help you move around (mobility aids) if they are needed. These include:  Canes.  Walkers.  Scooters.  Crutches.  Turn on the lights when you go into a dark area. Replace any light bulbs as soon as they burn out.  Set up your furniture so you have a clear path. Avoid moving your furniture around.  If any of your floors are uneven, fix them.  If there are any pets around you, be aware of where they are.  Review your medicines with your doctor. Some medicines can make you feel dizzy. This can increase your chance of falling. Ask your doctor what other things that you can do to help prevent falls. This information is not intended to replace advice given to you by your health care provider. Make sure you discuss any questions you have with your health care provider. Document Released: 01/06/2009 Document Revised: 08/18/2015 Document Reviewed: 04/16/2014 Elsevier Interactive Patient Education  2017 [employer].

## 2018-09-09 NOTE — Progress Notes (Signed)
Subjective:   Gerald Bauer is a 73 y.o. male who presents for Medicare Annual/Subsequent preventive examination.  Virtual Visit via Telephone Note  I connected with [name] on 09/09/18 at  1:00 PM EDT by telephone and verified that I am speaking with the correct person using two identifiers.  Medicare Annual Wellness visit conducted telephonically due to Covid-19 pandemic.   Location: Patient: home Provider: office   I discussed the limitations, risks, security and privacy concerns of performing an evaluation and management service by telephone and the availability of in person appointments. The patient expressed understanding and agreed to proceed.  Some vital signs may be absent or patient reported.   Clemetine Marker, LPN   Review of Systems:   Cardiac Risk Factors include: advanced age (>7men, >8 women);diabetes mellitus;dyslipidemia;hypertension;male gender     Objective:    Vitals: BP 138/89   Pulse 81   Temp (!) 97.5 F (36.4 C) (Oral)   Ht 5\' 7"  (1.702 m)   Wt 178 lb (80.7 kg)   BMI 27.88 kg/m   Body mass index is 27.88 kg/m.  Advanced Directives 09/09/2018 09/03/2017 01/10/2017 08/01/2016 07/11/2016 03/13/2016 07/11/2015  Does Patient Have a Medical Advance Directive? No No No No No No No  Would patient like information on creating a medical advance directive? No - Patient declined Yes (MAU/Ambulatory/Procedural Areas - Information given) - No - Patient declined - - No - patient declined information    Tobacco Social History   Tobacco Use  Smoking Status Former Smoker  . Packs/day: 1.00  . Years: 40.00  . Pack years: 40.00  . Types: Cigarettes  . Quit date: 03/09/1997  . Years since quitting: 21.5  Smokeless Tobacco Former Systems developer  Tobacco Comment   smoking cessation materials not required     Counseling given: Not Answered Comment: smoking cessation materials not required   Clinical Intake:  Pre-visit preparation completed: Yes  Pain :  No/denies pain     BMI - recorded: 27.88 Nutritional Status: BMI 25 -29 Overweight Nutritional Risks: None Diabetes: Yes CBG done?: No Did pt. bring in CBG monitor from home?: No   Nutrition Risk Assessment:  Has the patient had any N/V/D within the last 2 months?  No  Does the patient have any non-healing wounds?  No  Has the patient had any unintentional weight loss or weight gain?  No   Diabetes:  Is the patient diabetic?  Yes  If diabetic, was a CBG obtained today?  No  Did the patient bring in their glucometer from home?  No  How often do you monitor your CBG's? daily.   Financial Strains and Diabetes Management:  Are you having any financial strains with the device, your supplies or your medication? No .  Does the patient want to be seen by Chronic Care Management for management of their diabetes?  No  Would the patient like to be referred to a Nutritionist or for Diabetic Management?  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 Overdue for diabetic eye exam. Pt has been advised about the importance in completing this exam.   Diabetic Foot Exam: Completed 01/13/18.  How often do you need to have someone help you when you read instructions, pamphlets, or other written materials from your doctor or pharmacy?: 1 - Never  Interpreter Needed?: No  Information entered by :: Clemetine Marker LPN  Past Medical History:  Diagnosis Date  . Acid reflux   .  Arthritis    hands  . Carotid atherosclerosis   . Diabetes mellitus without complication ([place])   . Hyperlipemia   . Hypertension   . Prostate cancer ([place])    history  . Tachycardia    Past Surgical History:  Procedure Laterality Date  . APPENDECTOMY  1973  . CHOLECYSTECTOMY  2013  . PROSTATECTOMY  2012   Family History  Problem Relation Age of Onset  . Dementia Mother   . High Cholesterol Mother   . Hypertension Mother   . Arthritis Mother   . Hyperlipidemia Mother   .  Heart disease Father   . Heart attack Father   . Arthritis Maternal Grandmother   . Cancer Maternal Grandmother        colon  . Prostate cancer Brother   . Cancer Brother        prostate  . Cancer Maternal Uncle        colon  . COPD Maternal Uncle   . Stroke Paternal Grandmother   . Diabetes Paternal Grandmother   . Aneurysm Paternal Grandfather    Social History   Socioeconomic History  . Marital status: Widowed    Spouse name: Regino Schultze  . Number of children: 2  . Years of education: some college  . Highest education level: 12th grade  Occupational History  . Occupation: Retired    Comment: [employer]  Social Needs  . Financial resource strain: Not hard at all  . Food insecurity    Worry: Never true    Inability: Never true  . Transportation needs    Medical: No    Non-medical: No  Tobacco Use  . Smoking status: Former Smoker    Packs/day: 1.00    Years: 40.00    Pack years: 40.00    Types: Cigarettes    Quit date: 03/09/1997    Years since quitting: 21.5  . Smokeless tobacco: Former Systems developer  . Tobacco comment: smoking cessation materials not required  Substance and Sexual Activity  . Alcohol use: No    Alcohol/week: 0.0 standard drinks  . Drug use: No  . Sexual activity: Never  Lifestyle  . Physical activity    Days per week: 0 days    Minutes per session: 0 min  . Stress: Not at all  Relationships  . Social connections    Talks on phone: More than three times a week    Gets together: Three times a week    Attends religious service: More than 4 times per year    Active member of club or organization: No    Attends meetings of clubs or organizations: Never    Relationship status: Widowed  Other Topics Concern  . Not on file  Social History Narrative  . Not on file    Outpatient Encounter Medications as of 09/09/2018  Medication Sig  . amLODipine (NORVASC) 5 MG tablet TAKE 1 AND 1/2 TABLETS BY  MOUTH DAILY  . aspirin 81 MG tablet Take 81 mg by mouth  daily.  . cholecalciferol (VITAMIN D) 1000 units tablet Take 1 tablet (1,000 Units total) by mouth daily.  . Cyanocobalamin (VITAMIN B-12 CR PO) Take 1,000 mcg by mouth daily.   . famotidine (PEPCID) 20 MG tablet Take 1 tablet (20 mg total) by mouth 2 (two) times daily. For heartburn  . glucose blood (ONETOUCH VERIO) test strip Check fingerstick blood sugars once a day; Dx E11.65; LON 99 months  . Omega-3 Fatty Acids (FISH OIL) 1200 MG CAPS Take 1  capsule by mouth daily.   Glory Rosebush DELICA LANCETS 59F MISC CHECK BLOOD SUGAR ONCE A DAY AS DIRECTED; Dx E11.9, LON 99 months   Facility-Administered Encounter Medications as of 09/09/2018  Medication  . betamethasone acetate-betamethasone sodium phosphate (CELESTONE) injection 3 mg    Activities of Daily Living In your present state of health, do you have any difficulty performing the following activities: 09/09/2018 07/15/2018  Hearing? Y N  Comment no hearing aids -  Vision? N N  Difficulty concentrating or making decisions? N N  Walking or climbing stairs? N N  Dressing or bathing? N N  Doing errands, shopping? N N  Preparing Food and eating ? N -  Using the Toilet? N -  In the past six months, have you accidently leaked urine? Y -  Comment prostate removed -  Do you have problems with loss of bowel control? N -  Managing your Medications? N -  Managing your Finances? N -  Housekeeping or managing your Housekeeping? N -  Some recent data might be hidden    Patient Care Team: Lada, Satira Anis, MD as PCP - General (Family Medicine) Lucky Cowboy, Erskine Squibb, MD as Referring Physician (Vascular Surgery) [name], MD as Consulting Physician (Gastroenterology)   Assessment:   This is a routine wellness examination for [place].  Exercise Activities and Dietary recommendations Current Exercise Habits: The patient does not participate in regular exercise at present, Exercise limited by: None identified  Goals    . DIET - INCREASE WATER INTAKE      Recommend to drink at least 6-8 8oz glasses of water per day.       Fall Risk Fall Risk  09/09/2018 07/15/2018 01/13/2018 09/03/2017 07/11/2017  Falls in the past year? 0 0 Yes Yes Yes  Comment - - - - -  Number falls in past yr: 0 - 1 1 2  or more  Injury with Fall? 0 - Yes Yes No  Comment - - - L shoulder pain -  Risk for fall due to : - - - Impaired vision;History of fall(s);Medication side effect -  Follow up Falls prevention discussed - - Falls evaluation completed;Education provided;Falls prevention discussed -   FALL RISK PREVENTION PERTAINING TO THE HOME:  Any stairs in or around the home? Yes  If so, do they handrails? Yes   Home free of loose throw rugs in walkways, pet beds, electrical cords, etc? Yes  Adequate lighting in your home to reduce risk of falls? Yes   ASSISTIVE DEVICES UTILIZED TO PREVENT FALLS:  Life alert? No  Use of a cane, walker or w/c? No  Grab bars in the bathroom? Yes  Shower chair or bench in shower? Yes  Elevated toilet seat or a handicapped toilet? Yes  DME ORDERS:  DME order needed?  No   TIMED UP AND GO:  Was the test performed? No . Telephonic visit.  Education: Fall risk prevention has been discussed.  Intervention(s) required? No   Depression Screen PHQ 2/9 Scores 09/09/2018 01/13/2018 09/03/2017 07/11/2017  PHQ - 2 Score 0 0 0 1  PHQ- 9 Score - 0 0 -    Cognitive Function MMSE - Mini Mental State Exam 03/09/2014  Orientation to time 5  Orientation to Place 4  Registration 3  Attention/ Calculation 5  Recall 2  Language- name 2 objects 2  Language- repeat 1  Language- follow 3 step command 3  Language- read & follow direction 1  Write a sentence 1  Copy  design 1  Total score 28     6CIT Screen 09/09/2018 09/03/2017 07/11/2016  What Year? 0 points 0 points 0 points  What month? 0 points 0 points 0 points  What time? 0 points 0 points 0 points  Count back from 20 0 points 0 points 0 points  Months in reverse 0 points 0  points 0 points  Repeat phrase 0 points 2 points 4 points  Total Score 0 2 4    Immunization History  Administered Date(s) Administered  . Influenza, High Dose Seasonal PF 01/10/2017, 01/13/2018  . Influenza-Unspecified 01/10/2012, 01/01/2013, 01/01/2015  . Pneumococcal Conjugate-13 07/30/2013  . Pneumococcal Polysaccharide-23 02/27/2011  . Td 03/04/2014  . Zoster 02/17/2014  . Zoster Recombinat (Shingrix) 01/31/2017    Qualifies for Shingles Vaccine? Yes  Shingrix series completed.  Tdap: Up to date  Flu Vaccine: Up to date  Pneumococcal Vaccine: Up to date   Screening Tests Health Maintenance  Topic Date Due  . OPHTHALMOLOGY EXAM  10/18/2017  . HEMOGLOBIN A1C  07/15/2018  . COLONOSCOPY  10/01/2018  . INFLUENZA VACCINE  10/25/2018  . FOOT EXAM  01/14/2019  . URINE MICROALBUMIN  01/14/2019  . TETANUS/TDAP  03/04/2024  . Hepatitis C Screening  Completed  . PNA vac Low Risk Adult  Completed   Cancer Screenings:  Colorectal Screening: Completed 2015. Repeat every 5 years. Referral to GI placed today. Pt aware the office will call re: appt.  Lung Cancer Screening: (Low Dose CT Chest recommended if Age 14-80 years, 30 pack-year currently smoking OR have quit w/in 15years.) does not qualify.   Additional Screening:  Hepatitis C Screening: does qualify; Completed 05/09/15  Vision Screening: Recommended annual ophthalmology exams for early detection of glaucoma and other disorders of the eye. Is the patient up to date with their annual eye exam?  No  Who is the provider or what is the name of the office in which the pt attends annual eye exams? [place] Screening: Recommended annual dental exams for proper oral hygiene  Community Resource Referral:  CRR required this visit?  No       Plan:    I have personally reviewed and addressed the Medicare Annual Wellness questionnaire and have noted the following in the patient's chart:  A. Medical and  social history B. Use of alcohol, tobacco or illicit drugs  C. Current medications and supplements D. Functional ability and status E.  Nutritional status F.  Physical activity G. Advance directives H. List of other physicians I.  Hospitalizations, surgeries, and ER visits in previous 12 months J.  [place] such as hearing and vision if needed, cognitive and depression L. Referrals and appointments   In addition, I have reviewed and discussed with patient certain preventive protocols, quality metrics, and best practice recommendations. A written personalized care plan for preventive services as well as general preventive health recommendations were provided to patient.   Signed,  Clemetine Marker, LPN Nurse Health Advisor   Nurse Notes: pt doing well and appreciative of visit today.

## 2018-10-20 ENCOUNTER — Encounter: Payer: Self-pay | Admitting: *Deleted

## 2018-10-21 LAB — HEMOGLOBIN A1C: Hemoglobin A1C: 8.8

## 2018-11-21 ENCOUNTER — Other Ambulatory Visit: Payer: Self-pay | Admitting: Family Medicine

## 2018-11-21 DIAGNOSIS — E1165 Type 2 diabetes mellitus with hyperglycemia: Secondary | ICD-10-CM

## 2018-12-21 ENCOUNTER — Other Ambulatory Visit: Payer: Self-pay | Admitting: Family Medicine

## 2018-12-22 NOTE — Telephone Encounter (Signed)
Requested medication (s) are due for refill today: yes  Requested medication (s) are on the active medication list: yes  Last refill:  10/27/2018  Future visit scheduled: yes  Notes to clinic:  Review for refill   Requested Prescriptions  Pending Prescriptions Disp Refills   amLODipine (NORVASC) 5 MG tablet [Pharmacy Med Name: AMLODIPINE  5MG   TAB] 135 tablet 3    Sig: TAKE 1 AND 1/2 TABLETS BY  MOUTH DAILY     Cardiovascular:  Calcium Channel Blockers Passed - 12/21/2018 10:04 PM      Passed - Last BP in normal range    BP Readings from Last 1 Encounters:  09/09/18 138/89         Passed - Valid encounter within last 6 months    Recent Outpatient Visits          5 months ago Medication monitoring encounter   [facility] [place], Satira Anis, MD   11 months ago Type 2 diabetes mellitus without complication, without long-term current use of insulin [facility])   [place], Satira Anis, MD   1 year ago Chronic left shoulder pain   [place], Satira Anis, MD   1 year ago Left foot pain   [place], Satira Anis, MD   2 years ago Preventative health care   [place], Satira Anis, MD      Future Appointments            In 3 weeks [name], PA-C [facility] [place]   In 8 months  [facility], [facility]

## 2019-01-14 ENCOUNTER — Ambulatory Visit: Payer: Medicare Other | Admitting: Family Medicine

## 2019-01-20 ENCOUNTER — Encounter: Payer: Self-pay | Admitting: Family Medicine

## 2019-01-20 ENCOUNTER — Ambulatory Visit (INDEPENDENT_AMBULATORY_CARE_PROVIDER_SITE_OTHER): Payer: Medicare Other | Admitting: Family Medicine

## 2019-01-20 ENCOUNTER — Other Ambulatory Visit: Payer: Self-pay

## 2019-01-20 VITALS — BP 124/82 | HR 98 | Temp 98.3°F | Resp 14 | Ht 67.0 in | Wt 177.5 lb

## 2019-01-20 DIAGNOSIS — K219 Gastro-esophageal reflux disease without esophagitis: Secondary | ICD-10-CM | POA: Diagnosis not present

## 2019-01-20 DIAGNOSIS — Z5181 Encounter for therapeutic drug level monitoring: Secondary | ICD-10-CM

## 2019-01-20 DIAGNOSIS — I6529 Occlusion and stenosis of unspecified carotid artery: Secondary | ICD-10-CM

## 2019-01-20 DIAGNOSIS — I10 Essential (primary) hypertension: Secondary | ICD-10-CM

## 2019-01-20 DIAGNOSIS — E119 Type 2 diabetes mellitus without complications: Secondary | ICD-10-CM

## 2019-01-20 DIAGNOSIS — E785 Hyperlipidemia, unspecified: Secondary | ICD-10-CM

## 2019-01-20 DIAGNOSIS — Z23 Encounter for immunization: Secondary | ICD-10-CM | POA: Diagnosis not present

## 2019-01-20 DIAGNOSIS — E1169 Type 2 diabetes mellitus with other specified complication: Secondary | ICD-10-CM | POA: Diagnosis not present

## 2019-01-20 DIAGNOSIS — J310 Chronic rhinitis: Secondary | ICD-10-CM

## 2019-01-20 DIAGNOSIS — B351 Tinea unguium: Secondary | ICD-10-CM

## 2019-01-20 LAB — POCT GLYCOSYLATED HEMOGLOBIN (HGB A1C): Hemoglobin A1C: 6.8 % — AB (ref 4.0–5.6)

## 2019-01-20 LAB — POCT UA - MICROALBUMIN: Microalbumin Ur, POC: 20 mg/L

## 2019-01-20 MED ORDER — LEVOCETIRIZINE DIHYDROCHLORIDE 5 MG PO TABS: 5.0000 mg | ORAL_TABLET | Freq: Every evening | ORAL | 2 refills | Status: DC

## 2019-01-20 MED ORDER — METFORMIN HCL 500 MG PO TABS: 500.0000 mg | ORAL_TABLET | Freq: Two times a day (BID) | ORAL | 1 refills | Status: DC

## 2019-01-20 MED ORDER — FLUTICASONE PROPIONATE 50 MCG/ACT NA SUSP: 2.0000 | Freq: Every day | NASAL | 6 refills | Status: DC

## 2019-01-20 NOTE — Patient Instructions (Signed)
Start the metformin once a day and take until your stomach tolerates.  Then increase to twice a day and again take until tolerated.  Goal it to gradually increase dose until you are able to tolerate 1000 mg twice a day.  Please follow up in 1-2 months to recheck metformin toleration and blood sugar readings. Bring your meter to the appointment.  For your nasal symptoms- try a antihistamine (2nd generation) like xyzal, zyrtec, claritin or allegra and a steroid nasal spray like flonase or nasocort.  Can try any generic equivalents.  For dry irritated sinuses can also try a cool mister at night, avoid being directly under airflow like fans or vents, and can do saline nasal spray a few times a day to moisturize nasal passages.  Please see the handout on diabetes care.  You should be contacted by an eye doctor for diabetic eye exam. For good foot care I would also like to refer you to podiatrist please let me know if you be willing to go.  Additionally, we should try a stain medication at least a new kind 1-3 x a week and see if you tolerate it.

## 2019-01-20 NOTE — Progress Notes (Signed)
Name: Gerald Bauer   MRN: [id number]    DOB: 12/23/1945   Date:01/20/2019       Progress Note  Chief Complaint  Patient presents with  . Follow-up  . Diabetes     Subjective:   Gerald Bauer is a 73 y.o. male, presents to clinic for routine follow up on the conditions listed above.  Diabetes Mellitus Type II: DM dx 3-5 years ago Currently managing with lifestyle Pt was eating whatever he wanted until a few months ago when he got a mail lab that showed A1C up by 2%, 8.8 Pt has not taken meds in the past Fasting CBGs typically run 100-160, He is checking once a day, recent high CBG 202 and low CBG 102.  No hypoglycemic episodes Denies: Polyuria, polydipsia, polyphagia,neuropathy Some intermittent blurry vision only 2x, dry mouth  Recent pertinent labs: Lab Results  Component Value Date   HGBA1C 8.8 10/21/2018   HGBA1C 6.8 (H) 01/13/2018   HGBA1C 6.7 (H) 07/11/2017      Component Value Date/Time   NA 139 01/13/2018 0941   K 4.4 01/13/2018 0941   CL 102 01/13/2018 0941   CO2 23 01/13/2018 0941   GLUCOSE 150 (H) 01/13/2018 0941   GLUCOSE 125 (H) 01/10/2017 0913   BUN 12 01/13/2018 0941   CREATININE 0.91 01/13/2018 0941   CREATININE 0.84 01/10/2017 0913   CALCIUM 8.9 01/13/2018 0941   PROT 6.8 01/13/2018 0941   ALBUMIN 4.2 01/13/2018 0941   AST 15 01/13/2018 0941   ALT 15 01/13/2018 0941   ALKPHOS 61 01/13/2018 0941   BILITOT 0.5 01/13/2018 0941   GFRNONAA 83 01/13/2018 0941   GFRNONAA 87 01/10/2017 0913   GFRAA 96 01/13/2018 0941   GFRAA 101 01/10/2017 0913    Current diet: in general, a "healthy" diet   - decreased his carb intake Current exercise: none  Due for DM foot exam and eye exam ACEI/ARB: No - amlodipine only Statin: No - reports hx of intolerance  Foot exam done today -see EMR Patient is able to see the bottom of his feet easily, he has widespread toenail onychomycosis, with thickened, brittle cracking and yellow nails.  Some peeling on  his feet but no ulcers or calluses.  He has good sensation and strength, denies any peripheral neuropathy.  Has not seen a podiatrist recently but in the past had plantar fasciitis and did see podiatrist. He also used to see an eye doctor but his doctor retired -explained to him today that I will refer him to have a diabetic eye exam, and foot exam if he desires for his diffuse nail disease  Tried welcore, lovastatin in the past, he was told to stop due to "liver", no abnormal LFTs in our current available records for the past 3 years  Hypertension:  Pt diagnosed with HTN 10+ ago Currently managed on norvasc 5 mg Pt reports excellent med compliance and denies any SE.  No lightheadedness, hypotension, syncope. Blood pressure today is well controlled.  He does monitor at home, recent high was 135/90's BP Readings from Last 3 Encounters:  01/20/19 124/82  09/09/18 138/89  07/15/18 (!) 135/95   Pt denies CP, SOB, exertional sx, LE edema, palpitation, Ha's, visual disturbances Dietary efforts for BP?  Healthier, lower salt and lower carbs  Rhinitis, seasonal Having nasal and sinus irritation and post nasal drip, feels dry, irritated, scratchy, worse recently with weather changes  GERD - famotidine once a day, still has some reflux sx  every couple days, but he can skip a dose or two  Pt new to me today, was previously seen by Dr. Sanda Klein - other dx on chart include carotid atherosclerosis and stenosis -reviewed the last carotid ultrasound in chart which is from June 2015 which showed moderate to large amount of left-sided atherosclerotic plaque, with increased peak systolic velocities with suggested follow-up CTA and moderate right-sided atherosclerotic plaque without evidence of hemodynamically significant stenosis.  Pt states that he does see vascular once a year to monitor this  Patient received his flu shot today  Patient Active Problem List   Diagnosis Date Noted  . Left foot pain 01/10/2017   . Tinea pedis of both feet 01/10/2017  . Onychomycosis 01/10/2017  . Needs flu shot 01/10/2017  . Onychomycosis of multiple toenails with type 2 diabetes mellitus ([place]) 07/19/2016  . SOB (shortness of breath) 07/14/2016  . Carotid stenosis 03/13/2016  . 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 07/11/2015  . Medication monitoring encounter 05/04/2015  . Ganglion cyst 02/23/2015  . Joint pain in fingers of right hand 02/23/2015  . Hyperlipidemia, unspecified   . HTN (hypertension)   . Personal history of prostate cancer   . Carotid atherosclerosis   . Fatigue 08/20/2013  . GERD (gastroesophageal reflux disease) 08/20/2013    Past Surgical History:  Procedure Laterality Date  . APPENDECTOMY  1973  . CHOLECYSTECTOMY  2013  . PROSTATECTOMY  2012    Family History  Problem Relation Age of Onset  . Dementia Mother   . High Cholesterol Mother   . Hypertension Mother   . Arthritis Mother   . Hyperlipidemia Mother   . Heart disease Father   . Heart attack Father   . Arthritis Maternal Grandmother   . Cancer Maternal Grandmother        colon  . Prostate cancer Brother   . Cancer Brother        prostate  . Cancer Maternal Uncle        colon  . COPD Maternal Uncle   . Stroke Paternal Grandmother   . Diabetes Paternal Grandmother   . Aneurysm Paternal Grandfather     Social History   Socioeconomic History  . Marital status: Widowed    Spouse name: Regino Schultze  . Number of children: 2  . Years of education: some college  . Highest education level: 12th grade  Occupational History  . Occupation: Retired    Comment: [employer]  Social Needs  . Financial resource strain: Not hard at all  . Food insecurity    Worry: Never true    Inability: Never true  . Transportation needs    Medical: No    Non-medical: No  Tobacco Use  . Smoking status: Former Smoker    Packs/day: 1.00    Years: 40.00    Pack years: 40.00    Types: Cigarettes    Quit date: 03/09/1997     Years since quitting: 21.8  . Smokeless tobacco: Former Systems developer  . Tobacco comment: smoking cessation materials not required  Substance and Sexual Activity  . Alcohol use: No    Alcohol/week: 0.0 standard drinks  . Drug use: No  . Sexual activity: Never  Lifestyle  . Physical activity    Days per week: 0 days    Minutes per session: 0 min  . Stress: Not at all  Relationships  . Social connections    Talks on phone: More than three times a week    Gets  together: Three times a week    Attends religious service: More than 4 times per year    Active member of club or organization: No    Attends meetings of clubs or organizations: Never    Relationship status: Widowed  . Intimate partner violence    Fear of current or ex partner: No    Emotionally abused: No    Physically abused: No    Forced sexual activity: No  Other Topics Concern  . Not on file  Social History Narrative  . Not on file     Current Outpatient Medications:  .  amLODipine (NORVASC) 5 MG tablet, TAKE 1 AND 1/2 TABLETS BY  MOUTH DAILY, Disp: 135 tablet, Rfl: 3 .  aspirin 81 MG tablet, Take 81 mg by mouth daily., Disp: , Rfl:  .  cholecalciferol (VITAMIN D) 1000 units tablet, Take 1 tablet (1,000 Units total) by mouth daily., Disp: 90 tablet, Rfl: 3 .  Cyanocobalamin (VITAMIN B-12 CR PO), Take 1,000 mcg by mouth daily. , Disp: , Rfl:  .  famotidine (PEPCID) 20 MG tablet, TAKE 1 TABLET BY MOUTH 2  TIMES DAILY FOR HEARTBURN, Disp: 180 tablet, Rfl: 3 .  Omega-3 Fatty Acids (FISH OIL) 1200 MG CAPS, Take 1 capsule by mouth daily. , Disp: , Rfl:  .  fluticasone (FLONASE) 50 MCG/ACT nasal spray, Place 2 sprays into both nostrils daily., Disp: 16 g, Rfl: 6 .  Lancets (ONETOUCH DELICA PLUS 123XX123) MISC, USE TO CHECK BLOOD GLUCOSE  ONCE A DAY AS DIRECTED, Disp: 100 each, Rfl: 3 .  levocetirizine (XYZAL) 5 MG tablet, Take 1 tablet (5 mg total) by mouth every evening., Disp: 30 tablet, Rfl: 2 .  metFORMIN (GLUCOPHAGE) 500 MG  tablet, Take 1 tablet (500 mg total) by mouth 2 (two) times daily with a meal., Disp: 60 tablet, Rfl: 1 .  ONETOUCH VERIO test strip, CHECK FINGERSTICK BLOOD  SUGARS 1 TIME A DAY, Disp: 100 strip, Rfl: 3  Current Facility-Administered Medications:  .  betamethasone acetate-betamethasone sodium phosphate (CELESTONE) injection 3 mg, 3 mg, Intramuscular, Once, Amalia Hailey, Dorathy Daft, DPM  Allergies  Allergen Reactions  . Amoxicillin Swelling  . Statins Other (See Comments)    Affects the liver  . Welchol [Colesevelam Hcl] Other (See Comments)    affects the liver    I personally reviewed active problem list, medication list, allergies, family history, social history, health maintenance, notes from last encounter, lab results, imaging with the patient/caregiver today.  Review of Systems  Constitutional: Negative.   HENT: Negative.   Eyes: Negative.   Respiratory: Negative.   Cardiovascular: Negative.   Gastrointestinal: Negative.   Endocrine: Negative.   Genitourinary: Negative.   Musculoskeletal: Negative.   Skin: Negative.   Allergic/Immunologic: Negative.   Neurological: Negative.   Hematological: Negative.   Psychiatric/Behavioral: Negative.   All other systems reviewed and are negative.    Objective:    Vitals:   01/20/19 0813  BP: 124/82  Pulse: 98  Resp: 14  Temp: 98.3 F (36.8 C)  SpO2: 99%  Weight: 177 lb 8 oz (80.5 kg)  Height: 5\' 7"  (1.702 m)    Body mass index is 27.8 kg/m.  Physical Exam Vitals signs and nursing note reviewed.  Constitutional:      General: He is not in acute distress.    Appearance: Normal appearance. He is well-developed. He is not ill-appearing, toxic-appearing or diaphoretic.     Interventions: Face mask in place.  HENT:  Head: Normocephalic and atraumatic.     Jaw: No trismus.     Right Ear: External ear normal.     Left Ear: External ear normal.     Nose: Congestion and rhinorrhea present.     Mouth/Throat:     Mouth: Mucous  membranes are moist.     Pharynx: Oropharynx is clear.  Eyes:     General: Lids are normal. No scleral icterus.       Right eye: No discharge.        Left eye: No discharge.     Conjunctiva/sclera: Conjunctivae normal.     Pupils: Pupils are equal, round, and reactive to light.  Neck:     Musculoskeletal: Normal range of motion and neck supple.     Trachea: Trachea and phonation normal. No tracheal deviation.  Cardiovascular:     Rate and Rhythm: Normal rate and regular rhythm.     Pulses: Normal pulses.          Radial pulses are 2+ on the right side and 2+ on the left side.       Posterior tibial pulses are 2+ on the right side and 2+ on the left side.     Heart sounds: Normal heart sounds. No murmur. No friction rub. No gallop.   Pulmonary:     Effort: Pulmonary effort is normal. No respiratory distress.     Breath sounds: Normal breath sounds. No stridor. No wheezing, rhonchi or rales.  Abdominal:     General: Bowel sounds are normal. There is no distension.     Palpations: Abdomen is soft.     Tenderness: There is no abdominal tenderness. There is no guarding or rebound.  Musculoskeletal: Normal range of motion.     Right lower leg: No edema.     Left lower leg: No edema.  Skin:    General: Skin is warm and dry.     Capillary Refill: Capillary refill takes less than 2 seconds.     Coloration: Skin is not jaundiced.     Findings: No rash.     Nails: There is no clubbing.   Neurological:     Mental Status: He is alert.     Cranial Nerves: No dysarthria or facial asymmetry.     Motor: No weakness, tremor or abnormal muscle tone.     Coordination: Coordination normal.     Gait: Gait normal.  Psychiatric:        Mood and Affect: Mood normal.        Speech: Speech normal.        Behavior: Behavior normal. Behavior is cooperative.      No results found for this or any previous visit (from the past 2160 hour(s)).  Diabetic Foot Exam: Diabetic Foot Exam - Simple    Simple Foot Form Diabetic Foot exam was performed with the following findings: Yes 01/20/2019  8:30 AM  Visual Inspection Sensation Testing Pulse Check Comments      PHQ2/9: Depression screen [facility] 2/9 01/20/2019 09/09/2018 01/13/2018 09/03/2017 07/11/2017  Decreased Interest 0 0 0 0 0  Down, Depressed, Hopeless 0 0 0 0 1  PHQ - 2 Score 0 0 0 0 1  Altered sleeping 0 - 0 0 -  Tired, decreased energy 0 - 0 0 -  Change in appetite 0 - 0 0 -  Feeling bad or failure about yourself  0 - 0 0 -  Trouble concentrating 0 - 0 0 -  Moving slowly or  fidgety/restless 0 - 0 0 -  Suicidal thoughts 0 - 0 0 -  PHQ-9 Score 0 - 0 0 -  Difficult doing work/chores Not difficult at all - - Not difficult at all -    phq 9 is negative reviewed  Fall Risk: Fall Risk  01/20/2019 09/09/2018 07/15/2018 01/13/2018 09/03/2017  Falls in the past year? 0 0 0 Yes Yes  Comment - - - - -  Number falls in past yr: 0 0 - 1 1  Injury with Fall? 0 0 - Yes Yes  Comment - - - - L shoulder pain  Risk for fall due to : - - - - Impaired vision;History of fall(s);Medication side effect  Follow up - Falls prevention discussed - - Falls evaluation completed;Education provided;Falls prevention discussed      Functional Status Survey: Is the patient deaf or have difficulty hearing?: Yes Does the patient have difficulty seeing, even when wearing glasses/contacts?: No Does the patient have difficulty concentrating, remembering, or making decisions?: No Does the patient have difficulty walking or climbing stairs?: No Does the patient have difficulty dressing or bathing?: No Does the patient have difficulty doing errands alone such as visiting a doctor's office or shopping?: No  POCT UA - Microalbumin  Result Value Ref Range   Microalbumin Ur, POC 20 mg/L   Creatinine, POC     Albumin/Creatinine Ratio, Urine, POC    POCT HgB A1C  Result Value Ref Range   Hemoglobin A1C 6.8 (A) 4.0 - 5.6 %   HbA1c POC (<> result, manual  entry)     HbA1c, POC (prediabetic range)     HbA1c, POC (controlled diabetic range)       Assessment & Plan:       ICD-10-CM   1. Type 2 diabetes mellitus without complication, without long-term current use of insulin (HCC)  E11.9 Ambulatory referral to Ophthalmology    POCT UA - Microalbumin    metFORMIN (GLUCOPHAGE) 500 MG tablet    CBC with Differential/Platelet    Lipid panel    Comprehensive Metabolic Panel (CMET)    POCT HgB A1C    CANCELED: CBC with Differential/Platelet    CANCELED: COMPLETE METABOLIC PANEL WITH GFR    CANCELED: Lipid panel   A1C up from 6-7 for several years up to 8.8 with recent labs, recheck today, foot exam done  2. Hyperlipidemia associated with type 2 diabetes mellitus (HCC)  E11.69 Lipid panel   E78.5 Comprehensive Metabolic Panel (CMET)   not on meds due to "affects the liver" cannot see concerning LFTs in reviewing last couple years of labs - would try statin to tx HLD and atherosclerotic dz  3. Essential hypertension  I10 Comprehensive Metabolic Panel (CMET)   well controlled  4. Gastroesophageal reflux disease, unspecified whether esophagitis present  K21.9    well controlled  5. Onychomycosis 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)  E11.69 Comprehensive Metabolic Panel (CMET)   123456    diffuse - podiatry?    6. Carotid atherosclerosis, unspecified laterality  I65.29 Lipid panel    Comprehensive Metabolic Panel (CMET)   per vascular specialist, pt utd with annual visits and imaging  7. Rhinitis, unspecified type  J31.0 levocetirizine (XYZAL) 5 MG tablet    fluticasone (FLONASE) 50 MCG/ACT nasal spray   poorly controlled, worse with fall weather, steroid nasal spray and antihistamine with saline spray encouraged  8. Need for influenza vaccination  Z23 Flu Vaccine QUAD High Dose(Fluad)   done today  9. Medication monitoring encounter  Z51.81 CBC with Differential/Platelet    Lipid panel    Comprehensive Metabolic Panel  (CMET)    POCT HgB A1C    DM POC labs - well controlled with only diet efforts   No follow-ups on file.   [name], PA-C 01/20/19 8:49 AM

## 2019-01-21 LAB — LIPID PANEL
Chol/HDL Ratio: 5.4 ratio — ABNORMAL HIGH (ref 0.0–5.0)
Cholesterol, Total: 238 mg/dL — ABNORMAL HIGH (ref 100–199)
HDL: 44 mg/dL (ref >39)
LDL Chol Calc (NIH): 165 mg/dL — ABNORMAL HIGH (ref 0–99)
Triglycerides: 161 mg/dL — ABNORMAL HIGH (ref 0–149)
VLDL Cholesterol Cal: 29 mg/dL (ref 5–40)

## 2019-01-21 LAB — CBC WITH DIFFERENTIAL/PLATELET
Basophils Absolute: 0.1 10*3/uL (ref 0.0–0.2)
Basos: 1 %
EOS (ABSOLUTE): 0.2 10*3/uL (ref 0.0–0.4)
Eos: 2 %
Hematocrit: 43.5 % (ref 37.5–51.0)
Hemoglobin: 15.1 g/dL (ref 13.0–17.7)
Immature Grans (Abs): 0 10*3/uL (ref 0.0–0.1)
Immature Granulocytes: 0 %
Lymphocytes Absolute: 2 10*3/uL (ref 0.7–3.1)
Lymphs: 27 %
MCH: 30.6 pg (ref 26.6–33.0)
MCHC: 34.7 g/dL (ref 31.5–35.7)
MCV: 88 fL (ref 79–97)
Monocytes Absolute: 0.6 10*3/uL (ref 0.1–0.9)
Monocytes: 8 %
Neutrophils Absolute: 4.5 10*3/uL (ref 1.4–7.0)
Neutrophils: 62 %
Platelets: 249 10*3/uL (ref 150–450)
RBC: 4.93 x10E6/uL (ref 4.14–5.80)
RDW: 11.8 % (ref 11.6–15.4)
WBC: 7.4 10*3/uL (ref 3.4–10.8)

## 2019-01-21 LAB — COMPREHENSIVE METABOLIC PANEL
ALT: 19 IU/L (ref 0–44)
AST: 15 IU/L (ref 0–40)
Albumin/Globulin Ratio: 1.6 (ref 1.2–2.2)
Albumin: 4.2 g/dL (ref 3.7–4.7)
Alkaline Phosphatase: 62 IU/L (ref 39–117)
BUN/Creatinine Ratio: 14 (ref 10–24)
BUN: 11 mg/dL (ref 8–27)
Bilirubin Total: 0.4 mg/dL (ref 0.0–1.2)
CO2: 20 mmol/L (ref 20–29)
Calcium: 9.4 mg/dL (ref 8.6–10.2)
Chloride: 105 mmol/L (ref 96–106)
Creatinine, Ser: 0.78 mg/dL (ref 0.76–1.27)
GFR calc Af Amer: 103 mL/min/{1.73_m2} (ref >59)
GFR calc non Af Amer: 89 mL/min/{1.73_m2} (ref >59)
Globulin, Total: 2.6 g/dL (ref 1.5–4.5)
Glucose: 145 mg/dL — ABNORMAL HIGH (ref 65–99)
Potassium: 4.3 mmol/L (ref 3.5–5.2)
Sodium: 141 mmol/L (ref 134–144)
Total Protein: 6.8 g/dL (ref 6.0–8.5)

## 2019-01-23 ENCOUNTER — Telehealth: Payer: Self-pay | Admitting: Cardiovascular Disease

## 2019-01-23 ENCOUNTER — Telehealth: Payer: Self-pay

## 2019-01-23 DIAGNOSIS — E1169 Type 2 diabetes mellitus with other specified complication: Secondary | ICD-10-CM

## 2019-01-23 NOTE — Telephone Encounter (Signed)
-----   Message from [name], [place] sent at 01/22/2019  5:51 PM EDT ----- Kidney function and liver function good We reviewed his A1c at his visit please remind him that it was 6.8 which was good, at goal of less than 7  His cholesterol is very very high, it puts him at risk for strokes and heart attacks.  I know that he cannot take statin medication because of his allergy but it may be worth consulting with a cardiologist because they have alternate new medicines available to them that would help decrease his risk.  Can you please see if he would be interested in a cardiology consult to address his very high cholesterol

## 2019-02-03 NOTE — Progress Notes (Signed)
Virtual Visit via Video Note   This visit type was conducted due to national recommendations for restrictions regarding the COVID-19 Pandemic (e.g. social distancing) in an effort to limit this patient's exposure and mitigate transmission in our community.  Due to his co-morbid illnesses, this patient is at least at moderate risk for complications without adequate follow up.  This format is felt to be most appropriate for this patient at this time.  All issues noted in this document were discussed and addressed.  A limited physical exam was performed with this format.  Please refer to the patient's chart for his consent to telehealth for [facility].   I connected with  Gerald Bauer on 02/04/19 by a video enabled telemedicine application and verified that I am speaking with the correct person using two identifiers. I discussed the limitations of evaluation and management by telemedicine. The patient expressed understanding and agreed to proceed.   Evaluation Performed:  Follow-up visit  Date:  02/04/2019   ID:  Gerald Bauer, Gerald Bauer 1945-09-09, MRN [id number]  Patient Location:  [address] [place] [address]   Provider location:   [facility], [place] office  PCP:  [name], PA-C  Cardiologist:  Patsy Baltimore   Chief Complaint  Patient presents with  . other    Ref by [name] to establish care for uncontrolled cholesterol with difficulty tolerating statins. Patient was at [facility] being followed for Hyperlipidemia. Denies chest pain or shortness of breath. "doing well."      History of Present Illness:    Gerald Bauer is a 73 y.o. male who presents via audio/video conferencing for a telehealth visit today.   The patient does not symptoms concerning for COVID-19 infection (fever, chills, cough, or new SHORTNESS OF BREATH).   Patient has a past medical history of HTN Syncope Palpitations Former smoker Who presents for  discussion of his hyperlipidemia  Lab work reviewed in detail Total cholesterol 238, LDL 165 HBA1C 6.8 Difficulty tolerating statins Reports he has tried Crestor, welcol Lovastatin, myalgia in back  Weight trending down, watching his diet  Active, remodeling car/truck, gardening  Some SOB, with heavy exertion, stable  CT ABD aortic: Images pulled up and reviewed with him Calcification in the proximal LAD, moderate aortic atherosclerosis noted  Denies any recent near syncope or syncope symptoms  Prior history reviewed  syncopal episode at home doing Mother's Day dinner 2015 sitting on a couch in got up and fell to the floor  Loss of consciousness for short time.   echocardiogram which showed ejection fraction about 50%. Holter monitor was also unremarkable and regular stress test she was able to exercise 6 min and reached a heart rate of 150 and was unremarkable.   Prior CV studies:   The following studies were reviewed today:    Past Medical History:  Diagnosis Date  . Acid reflux   . Arthritis    hands  . Carotid atherosclerosis   . Diabetes mellitus without complication ([place])   . Hyperlipemia   . Hypertension   . Prostate cancer ([place])    history  . Tachycardia    Past Surgical History:  Procedure Laterality Date  . APPENDECTOMY  1973  . CHOLECYSTECTOMY  2013  . PROSTATECTOMY  2012      Allergies:   Amoxicillin, Statins, and Welchol [colesevelam hcl]   Social History   Tobacco Use  . Smoking status: Former Smoker  Packs/day: 1.00    Years: 40.00    Pack years: 40.00    Types: Cigarettes    Quit date: 03/09/1997    Years since quitting: 21.9  . Smokeless tobacco: Former Systems developer  . Tobacco comment: smoking cessation materials not required  Substance Use Topics  . Alcohol use: No    Alcohol/week: 0.0 standard drinks  . Drug use: No     Current Outpatient Medications on File Prior to Visit  Medication Sig Dispense Refill  . amLODipine (NORVASC)  5 MG tablet TAKE 1 AND 1/2 TABLETS BY  MOUTH DAILY 135 tablet 3  . aspirin 81 MG tablet Take 81 mg by mouth daily.    . cholecalciferol (VITAMIN D) 1000 units tablet Take 1 tablet (1,000 Units total) by mouth daily. 90 tablet 3  . Cyanocobalamin (VITAMIN B-12 CR PO) Take 1,000 mcg by mouth daily.     . famotidine (PEPCID) 20 MG tablet TAKE 1 TABLET BY MOUTH 2  TIMES DAILY FOR HEARTBURN 180 tablet 3  . fluticasone (FLONASE) 50 MCG/ACT nasal spray Place 2 sprays into both nostrils daily. 16 g 6  . Lancets (ONETOUCH DELICA PLUS 123XX123) MISC USE TO CHECK BLOOD GLUCOSE  ONCE A DAY AS DIRECTED 100 each 3  . levocetirizine (XYZAL) 5 MG tablet Take 1 tablet (5 mg total) by mouth every evening. 30 tablet 2  . Omega-3 Fatty Acids (FISH OIL) 1200 MG CAPS Take 1 capsule by mouth daily.     [name] test strip CHECK FINGERSTICK BLOOD  SUGARS 1 TIME A DAY 100 strip 3  . metFORMIN (GLUCOPHAGE) 500 MG tablet Take 1 tablet (500 mg total) by mouth 2 (two) times daily with a meal. (Patient not taking: Reported on 02/04/2019) 60 tablet 1   Current Facility-Administered Medications on File Prior to Visit  Medication Dose Route Frequency Provider Last Rate Last Dose  . betamethasone acetate-betamethasone sodium phosphate (CELESTONE) injection 3 mg  3 mg Intramuscular Once Edrick Kins, DPM         Family Hx: The patient's family history includes Aneurysm in his paternal grandfather; Arthritis in his maternal grandmother and mother; COPD in his maternal uncle; Cancer in his brother, maternal grandmother, and maternal uncle; Dementia in his mother; Diabetes in his paternal grandmother; Heart attack in his father; Heart disease in his father; High Cholesterol in his mother; Hyperlipidemia in his mother; Hypertension in his mother; Prostate cancer in his brother; Stroke in his paternal grandmother.  ROS:   Please see the history of present illness.    Review of Systems  Constitutional: Negative.   HENT:  Negative.   Respiratory: Positive for shortness of breath.   Cardiovascular: Negative.   Gastrointestinal: Negative.   Musculoskeletal: Negative.   Neurological: Negative.   Psychiatric/Behavioral: Negative.   All other systems reviewed and are negative.    Labs/Other Tests and Data Reviewed:    Recent Labs: 01/20/2019: ALT 19; BUN 11; Creatinine, Ser 0.78; Hemoglobin 15.1; Platelets 249; Potassium 4.3; Sodium 141   Recent Lipid Panel Lab Results  Component Value Date/Time   CHOL 238 (H) 01/20/2019 09:30 AM   TRIG 161 (H) 01/20/2019 09:30 AM   HDL 44 01/20/2019 09:30 AM   CHOLHDL 5.4 (H) 01/20/2019 09:30 AM   CHOLHDL 5.2 (H) 01/10/2017 09:13 AM   LDLCALC 165 (H) 01/20/2019 09:30 AM   LDLCALC 165 (H) 01/10/2017 09:13 AM    Wt Readings from Last 3 Encounters:  02/04/19 174 lb (78.9 kg)  01/20/19 177  lb 8 oz (80.5 kg)  09/09/18 178 lb (80.7 kg)     Exam:    Vital Signs: Vital signs may also be detailed in the HPI BP 119/79   Pulse 90   Ht 5\' 7"  (1.702 m)   Wt 174 lb (78.9 kg)   BMI 27.25 kg/m   Wt Readings from Last 3 Encounters:  02/04/19 174 lb (78.9 kg)  01/20/19 177 lb 8 oz (80.5 kg)  09/09/18 178 lb (80.7 kg)   Temp Readings from Last 3 Encounters:  01/20/19 98.3 F (36.8 C)  09/09/18 (!) 97.5 F (36.4 C) (Oral)  01/13/18 98.2 F (36.8 C)   BP Readings from Last 3 Encounters:  02/04/19 119/79  01/20/19 124/82  09/09/18 138/89   Pulse Readings from Last 3 Encounters:  02/04/19 90  01/20/19 98  09/09/18 81     Well nourished, well developed male in no acute distress. Constitutional:  oriented to person, place, and time. No distress.  Head: Normocephalic and atraumatic.  Eyes:  no discharge. No scleral icterus.  Neck: Normal range of motion. Neck supple.  Pulmonary/Chest: No audible wheezing, no distress, appears comfortable Musculoskeletal: Normal range of motion.  no  tenderness or deformity.  Neurological:   Coordination normal. Full exam not  performed Skin:  No rash Psychiatric:  normal mood and affect. behavior is normal. Thought content normal.    ASSESSMENT & PLAN:    Problem List Items Addressed This Visit      Cardiology Problems   Carotid atherosclerosis (Chronic)   Hyperlipidemia, unspecified   HTN (hypertension)    Other Visit Diagnoses    PAD (peripheral artery disease) ([place])    -  Primary   Aortic atherosclerosis (HCC)         PAD/aortic atherosclerosis Seen on CT scan from 2012 moderate in the abdominal aorta, Prior smoker, hyperlipidemia not controlled Intolerant of statins including Crestor lovastatin WelChol with myalgias Recommend he try Repatha We will likely need to come in when he obtains the medication and have a nurse show him how to use it  Coronary calcification At least moderate, seen on CT scan 2012 Particularly proximal LAD, proximal RCA Stressed importance of aggressive lipid control  Carotid stenosis 40 to 59% on the left Goal LDL less than 70 given PAD Plan as above, start Repatha May need to add Zetia  Hyperlipidemia Statin intolerant We will send a prescription for Repatha, Paperwork filled out for preauthorization Importance of aggressive lipid management discussed with him  We were cut off several times during this call, Called him back several times, finally went to the landline in the office Despite this is phone had poor connection, Eventually unable to hear him and I had office staff call him to arrange instructions  COVID-19 Education: The signs and symptoms of COVID-19 were discussed with the patient and how to seek care for testing (follow up with PCP or arrange E-visit).  The importance of social distancing was discussed today.  Patient Risk:   After full review of this patients clinical status, I feel that they are at least moderate risk at this time.  Time:   Today, I have spent 45 minutes with the patient with telehealth technology discussing the cardiac  and medical problems/diagnoses detailed above   Additional 10 min spent reviewing the chart prior to patient visit today   Medication Adjustments/Labs and Tests Ordered: Current medicines are reviewed at length with the patient today.  Concerns regarding medicines are outlined above.  Tests Ordered: No tests ordered   Medication Changes: No changes made   Disposition: Follow-up in 6 months   Signed, Ida Rogue, MD  [place] Office [address] [place] [address], [place], [place] [address]

## 2019-02-04 ENCOUNTER — Encounter: Payer: Self-pay | Admitting: Cardiovascular Disease

## 2019-02-04 ENCOUNTER — Other Ambulatory Visit: Payer: Self-pay

## 2019-02-04 ENCOUNTER — Telehealth (INDEPENDENT_AMBULATORY_CARE_PROVIDER_SITE_OTHER): Payer: Medicare Other | Admitting: Cardiovascular Disease

## 2019-02-04 VITALS — BP 119/79 | HR 90 | Ht 67.0 in | Wt 174.0 lb

## 2019-02-04 DIAGNOSIS — E782 Mixed hyperlipidemia: Secondary | ICD-10-CM

## 2019-02-04 DIAGNOSIS — I700 Atherosclerosis of aorta: Secondary | ICD-10-CM

## 2019-02-04 DIAGNOSIS — I739 Peripheral vascular disease, unspecified: Secondary | ICD-10-CM | POA: Diagnosis not present

## 2019-02-04 DIAGNOSIS — T466X5A Adverse effect of antihyperlipidemic and antiarteriosclerotic drugs, initial encounter: Secondary | ICD-10-CM

## 2019-02-04 DIAGNOSIS — I6529 Occlusion and stenosis of unspecified carotid artery: Secondary | ICD-10-CM

## 2019-02-04 DIAGNOSIS — I10 Essential (primary) hypertension: Secondary | ICD-10-CM

## 2019-02-04 MED ORDER — REPATHA SURECLICK 140 MG/ML ~~LOC~~ SOAJ: 140.0000 mg | SUBCUTANEOUS | 11 refills | Status: DC

## 2019-02-04 NOTE — Telephone Encounter (Signed)
Patient replied yes to the Telephone Evisit today.

## 2019-02-04 NOTE — Patient Instructions (Addendum)
We will order Repatha for peripheral arterial disease, also coronary artery disease, hyperlipidemia  Once he has been on the Repatha 3 months we will order lipids and LFTs   Medication Instructions:  Your physician has recommended you make the following change in your medication:  1. START Repatha injectable. We will send in prescription and once you receive this please call us if we need to show you how to use pen. Once you have been on this for 3 months we would like to get some labs done.    If you need a refill on your cardiac medications before your next appointment, please call your pharmacy.    Lab work: Lipid and LFT after being on Repatha for 3 months   If you have labs (blood work) drawn today and your tests are completely normal, you will receive your results only by: Marland Kitchen MyChart Message (if you have MyChart) OR . A paper copy in the mail If you have any lab test that is abnormal or we need to change your treatment, we will call you to review the results.   Testing/Procedures: No new testing needed   Follow-Up: At [facility], you and your health needs are our priority.  As part of our continuing mission to provide you with exceptional heart care, we have created designated Provider Care Teams.  These Care Teams include your primary Cardiologist (physician) and Advanced Practice Providers (APPs -  Physician Assistants and Nurse Practitioners) who all work together to provide you with the care you need, when you need it.  . You will need a follow up appointment in 6 months, in office visit .   Please call our office 2 months in advance to schedule this appointment.    . Providers on your designated Care Team:   . Murray Hodgkins, NP . Christell Faith, PA-C . Marrianne Mood, PA-C  Any Other Special Instructions Will Be Listed Below (If Applicable).  For educational health videos Log in to : [email or web address] Or : [email or web address], password : [email or web address]

## 2019-02-05 ENCOUNTER — Telehealth: Payer: Self-pay

## 2019-02-05 NOTE — Telephone Encounter (Signed)
Repatha 140mg /mL Key: [id number]   Prior authorization initiated and sent to [employer] [facility] through [email or web address].  Request Approved through 08/05/2019. Reference # [id number]

## 2019-02-13 ENCOUNTER — Telehealth: Payer: Self-pay | Admitting: Cardiovascular Disease

## 2019-02-13 NOTE — Telephone Encounter (Signed)
Spoke with patient and he wanted to come in for education on how to inject the [place]. He has everything that he needs but just wants someone to show him how to do it. Scheduled him to come in at 10:00 AM for me to review that with him. He was appreciative for the call with no further questions.

## 2019-02-13 NOTE — Telephone Encounter (Signed)
Please call to discuss Repatha injections.

## 2019-02-16 ENCOUNTER — Ambulatory Visit (INDEPENDENT_AMBULATORY_CARE_PROVIDER_SITE_OTHER): Payer: Medicare Other | Admitting: *Deleted

## 2019-02-16 ENCOUNTER — Other Ambulatory Visit: Payer: Self-pay

## 2019-02-16 DIAGNOSIS — E785 Hyperlipidemia, unspecified: Secondary | ICD-10-CM | POA: Diagnosis not present

## 2019-02-16 NOTE — Progress Notes (Signed)
Patient came in today to have education on administration of his repatha. We reviewed all materials and then did some demonstrations of administration with teach back so that he understands the process. He was appreciative for the detailed information and had no further questions at this time. He did confirm his upcoming appointment with no further needs.

## 2019-03-17 ENCOUNTER — Ambulatory Visit (INDEPENDENT_AMBULATORY_CARE_PROVIDER_SITE_OTHER): Payer: Medicare Other | Admitting: Nurse Practitioner

## 2019-03-17 ENCOUNTER — Encounter (INDEPENDENT_AMBULATORY_CARE_PROVIDER_SITE_OTHER): Payer: Medicare Other

## 2019-03-19 DIAGNOSIS — G720 Drug-induced myopathy: Secondary | ICD-10-CM | POA: Insufficient documentation

## 2019-04-10 LAB — HM DIABETES EYE EXAM

## 2019-04-22 ENCOUNTER — Ambulatory Visit (INDEPENDENT_AMBULATORY_CARE_PROVIDER_SITE_OTHER): Payer: Medicare Other | Admitting: Family Medicine

## 2019-04-22 ENCOUNTER — Encounter: Payer: Self-pay | Admitting: Family Medicine

## 2019-04-22 DIAGNOSIS — E119 Type 2 diabetes mellitus without complications: Secondary | ICD-10-CM

## 2019-04-22 MED ORDER — METFORMIN HCL 500 MG PO TABS: 500.0000 mg | ORAL_TABLET | Freq: Two times a day (BID) | ORAL | 1 refills | Status: DC

## 2019-04-22 NOTE — Progress Notes (Signed)
Name: Gerald Bauer   MRN: [id number]    DOB: 05/20/1945   Date:04/22/2019       Progress Note  Subjective:    Chief Complaint  Chief Complaint  Patient presents with  . Follow-up  . Hypertension  . Diabetes    I connected with  [name] on 04/22/19 at  8:00 AM EST by telephone and verified that I am speaking with the correct person using two identifiers.   I discussed the limitations, risks, security and privacy concerns of performing an evaluation and management service by telephone and the availability of in person appointments. Staff also discussed with the patient that there may be a patient responsible charge related to this service. Patient Location: home Provider Location: [facility] Additional Individuals present: none  HPI Initial phone call with to message machine  F/up for DM - pt was seen here 3 months ago in person by me, A1C was high six months ago, up to 8.8 after previously being well controlled on metformin.  He improved his A1C back down to 6.8 (see labs) with continued med compliance and more lifestyle efforts.   Lab Results  Component Value Date   HGBA1C 6.8 (A) 01/20/2019  He continues to take metformin 500 mg BID, having a lot of diarrhea with it but it is gradually getting better, he does not want to change to XR form, would like to keep same dose and see if it continues to improve. He checked CBG in the morning usually run 110-150. Foot exam done 3 months ago revealed severe onychomycosis, pt states he is working on this at home with epsom salts, and he refused again to go to podiatrist for eval. Denies: Polyuria, polydipsia, polyphagia, vision changes, or neuropathy ACEI/ARB: Yes Statin: did go to cardiology and started Repatha, due for repeat labs 3 months after starting meds - this would be Feb 11th, and he has f/up visit with cardiology in 6 months  HTN - blood pressure controlled, pt compliant with med tx, denies any SE  GERD - states no  change, doesn't need anything  HLD - per cardiology       Patient Active Problem List   Diagnosis Date Noted  . 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 Left foot pain 01/10/2017  . Tinea pedis of both feet 01/10/2017  . Onychomycosis 01/10/2017  . Needs flu shot 01/10/2017  . 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 SOB (shortness of breath) 07/14/2016  . Carotid stenosis 03/13/2016  . 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 07/11/2015  . Medication monitoring encounter 05/04/2015  . Ganglion cyst 02/23/2015  . Joint pain in fingers of right hand 02/23/2015  . Hyperlipidemia, unspecified   . HTN (hypertension)   . Personal history of prostate cancer   . Carotid atherosclerosis   . Fatigue 08/20/2013  . GERD (gastroesophageal reflux disease) 08/20/2013    Social History   Tobacco Use  . Smoking status: Former Smoker    Packs/day: 1.00    Years: 40.00    Pack years: 40.00    Types: Cigarettes    Quit date: 03/09/1997    Years since quitting: 22.1  . Smokeless tobacco: Former Systems developer  . Tobacco comment: smoking cessation materials not required  Substance Use Topics  . Alcohol use: No    Alcohol/week: 0.0 standard drinks     Current Outpatient Medications:  .  amLODipine (NORVASC) 5 MG tablet, TAKE 1 AND 1/2  TABLETS BY  MOUTH DAILY, Disp: 135 tablet, Rfl: 3 .  aspirin 81 MG tablet, Take 81 mg by mouth daily., Disp: , Rfl:  .  Evolocumab (REPATHA SURECLICK) [id number] MG/ML SOAJ, Inject 140 mg into the skin every 14 (fourteen) days., Disp: 2 pen, Rfl: 11 .  famotidine (PEPCID) 20 MG tablet, TAKE 1 TABLET BY MOUTH 2  TIMES DAILY FOR HEARTBURN, Disp: 180 tablet, Rfl: 3 .  fluticasone (FLONASE) 50 MCG/ACT nasal spray, Place 2 sprays into both nostrils daily., Disp: 16 g, Rfl: 6 .  Lancets (ONETOUCH DELICA PLUS 123XX123) MISC, USE TO CHECK BLOOD GLUCOSE  ONCE A DAY AS DIRECTED, Disp: 100 each, Rfl: 3 .  metFORMIN (GLUCOPHAGE) 500 MG  tablet, Take 1 tablet (500 mg total) by mouth 2 (two) times daily with a meal., Disp: 60 tablet, Rfl: 1 .  Omega-3 Fatty Acids (FISH OIL) 1200 MG CAPS, Take 1 capsule by mouth daily. , Disp: , Rfl:  .  ONETOUCH VERIO test strip, CHECK FINGERSTICK BLOOD  SUGARS 1 TIME A DAY, Disp: 100 strip, Rfl: 3 .  cholecalciferol (VITAMIN D) 1000 units tablet, Take 1 tablet (1,000 Units total) by mouth daily. (Patient not taking: Reported on 04/22/2019), Disp: 90 tablet, Rfl: 3 .  Cyanocobalamin (VITAMIN B-12 CR PO), Take 1,000 mcg by mouth daily. , Disp: , Rfl:  .  levocetirizine (XYZAL) 5 MG tablet, Take 1 tablet (5 mg total) by mouth every evening. (Patient not taking: Reported on 04/22/2019), Disp: 30 tablet, Rfl: 2  Current Facility-Administered Medications:  .  betamethasone acetate-betamethasone sodium phosphate (CELESTONE) injection 3 mg, 3 mg, Intramuscular, Once, Edrick Kins, DPM  Allergies  Allergen Reactions  . Amoxicillin Swelling  . Statins Other (See Comments)    Affects the liver  . Welchol [Colesevelam Hcl] Other (See Comments)    affects the liver    Chart Review: I personally reviewed active problem list, medication list, allergies, family history, social history, health maintenance, notes from last encounter, lab results, imaging with the patient/caregiver today.  Review of Systems  Constitutional: Negative.   HENT: Negative.   Eyes: Negative.   Respiratory: Negative.   Cardiovascular: Negative.   Gastrointestinal: Negative.   Endocrine: Negative.   Genitourinary: Negative.   Musculoskeletal: Negative.   Skin: Negative.   Allergic/Immunologic: Negative.   Neurological: Negative.   Hematological: Negative.   Psychiatric/Behavioral: Negative.   All other systems reviewed and are negative.    Objective:    Virtual encounter, vitals limited, only able to obtain the following Today's Vitals   04/22/19 0744  BP: 108/83  Pulse: 86  Temp: (!) 96 F (35.6 C)  TempSrc:  Oral  Weight: 174 lb (78.9 kg)  Height: 5\' 7"  (1.702 m)   Body mass index is 27.25 kg/m. Nursing Note and Vital Signs reviewed.  Physical Exam Pt alert, clear phonation, answering questions appropriately PE limited by telephone encounter  No results found for this or any previous visit (from the past 72 hour(s)).  Assessment and Plan:     ICD-10-CM   1. Type 2 diabetes mellitus without complication, without long-term current use of insulin (HCC)  E11.9 metFORMIN (GLUCOPHAGE) 500 MG tablet   med refill today, some GI/diarrhea SE with metformin but pt wants to continue same dose, will do labs in 3 months, fasting CBG's are near goal, last A1C 6.8    -Red flags and when to present for emergency care or RTC including but not limited to new/worsening/un-resolving symptoms,  reviewed with patient  at time of visit. Follow up and care instructions discussed and provided in AVS. - I discussed the assessment and treatment plan with the patient. The patient was provided an opportunity to ask questions and all were answered. The patient agreed with the plan and demonstrated an understanding of the instructions.  - The patient was advised to call back or seek an in-person evaluation if the symptoms worsen or if the condition fails to improve as anticipated.  I provided 15 minutes of non-face-to-face time during this encounter.  [name], PA-C 04/22/19 8:18 AM

## 2019-07-07 ENCOUNTER — Telehealth: Payer: Self-pay

## 2019-07-07 NOTE — Telephone Encounter (Signed)
PA started through [place] (Key: [id number]) Repatha SureClick 140MG /ML auto-injectors  Wait for Determination Please wait for [employer] 2017 NCPDP to return a determination.

## 2019-07-08 NOTE — Telephone Encounter (Signed)
Request Reference Number: [id number].  REPATHA SURE INJ 140MG /ML is approved through 03/25/2020.  Your patient may now fill this prescription and it will be covered.

## 2019-07-28 ENCOUNTER — Telehealth: Payer: Self-pay | Admitting: Family Medicine

## 2019-07-28 NOTE — Chronic Care Management (AMB) (Signed)
  Chronic Care Management   Note  07/28/2019 Name: [name] MRN: [id number] DOB: Apr 06, 1945  Gerald Bauer is a 74 y.o. year old male who is a primary care patient of [name], [place]. I reached out to [name] by phone today in response to a referral sent by [name]'s health plan.     Mr. Cislo was given information about Chronic Care Management services today including:  1. CCM service includes personalized support from designated clinical staff supervised by his physician, including individualized plan of care and coordination with other care providers 2. 24/7 contact phone numbers for assistance for urgent and routine care needs. 3. Service will only be billed when office clinical staff spend 20 minutes or more in a month to coordinate care. 4. Only one practitioner may furnish and bill the service in a calendar month. 5. The patient may stop CCM services at any time (effective at the end of the month) by phone call to the office staff. 6. The patient will be responsible for cost sharing (co-pay) of up to 20% of the service fee (after annual deductible is met).  Patient agreed to services and verbal consent obtained.   Follow up plan: Telephone appointment with care management team member scheduled for: 08/11/2019.  [place], [place] [address] Direct Dial: [phone] [email or web address]: [email or web address]

## 2019-08-09 NOTE — Progress Notes (Signed)
Date:  08/10/2019   ID:  [name], DOB 07/15/1945, MRN [id number]  Patient Location:  [address] [place] [address]   Provider location:   [facility], [place] office  PCP:  [name], PA-C  Cardiologist:  Patsy Baltimore   Chief Complaint  Patient presents with  . other    6 month f/u no complaints today. Meds reviewed verbally with pt.    History of Present Illness:    Gerald Bauer is a 74 y.o. male past medical history of HTN Syncope Palpitations Former smoke Carotid stenosis 40 to 59% on the left CT ABD aortic: Calcification in the proximal LAD, moderate aortic atherosclerosis noted Who presents for discussion of his hyperlipidemia  Lost wife 2017  On repatha for 6 months No complaints  Active, remodeling car/truck, gardening Model train  SOB getting better Was ou of shape 10K steps  Labs in 2020Total cholesterol 238, LDL 165 HBA1C 6.8 Difficulty tolerating statins Reports he has tried Crestor, welcol Lovastatin, myalgia in back  Denies any recent near syncope or syncope symptoms  EKG personally reviewed by myself on todays visit NSR rate 91 bpm no ST changes  Prior history reviewed  syncopal episode at home doing Mother's Day dinner 2015 sitting on a couch in got up and fell to the floor  Loss of consciousness for short time.   echocardiogram which showed ejection fraction about 50%. Holter monitor was also unremarkable and regular stress test she was able to exercise 6 min and reached a heart rate of 150 and was unremarkable.   Prior CV studies:   The following studies were reviewed today:    Past Medical History:  Diagnosis Date  . Acid reflux   . Arthritis    hands  . Carotid atherosclerosis   . Diabetes mellitus without complication ([place])   . Hyperlipemia   . Hypertension   . Prostate cancer ([place])    history  . Tachycardia    Past Surgical History:  Procedure Laterality Date  .  APPENDECTOMY  1973  . CHOLECYSTECTOMY  2013  . PROSTATECTOMY  2012      Allergies:   Amoxicillin, Statins, and Welchol [colesevelam hcl]   Social History   Tobacco Use  . Smoking status: Former Smoker    Packs/day: 1.00    Years: 40.00    Pack years: 40.00    Types: Cigarettes    Quit date: 03/09/1997    Years since quitting: 22.4  . Smokeless tobacco: Former Systems developer  . Tobacco comment: smoking cessation materials not required  Substance Use Topics  . Alcohol use: No    Alcohol/week: 0.0 standard drinks  . Drug use: No     Current Outpatient Medications on File Prior to Visit  Medication Sig Dispense Refill  . amLODipine (NORVASC) 5 MG tablet TAKE 1 AND 1/2 TABLETS BY  MOUTH DAILY 135 tablet 3  . aspirin 81 MG tablet Take 81 mg by mouth daily.    . cholecalciferol (VITAMIN D) 1000 units tablet Take 1 tablet (1,000 Units total) by mouth daily. 90 tablet 3  . Cyanocobalamin (VITAMIN B-12 CR PO) Take 1,000 mcg by mouth daily.     . Evolocumab (REPATHA SURECLICK) XX123456 MG/ML SOAJ Inject 140 mg into the skin every 14 (fourteen) days. 2 pen 11  . famotidine (PEPCID) 20 MG tablet TAKE 1 TABLET BY MOUTH 2  TIMES DAILY FOR HEARTBURN 180 tablet 3  . Lancets [facility]  DELICA PLUS [id number]) MISC USE TO CHECK BLOOD GLUCOSE  ONCE A DAY AS DIRECTED 100 each 3  . Omega-3 Fatty Acids (FISH OIL) 1200 MG CAPS Take 1 capsule by mouth daily.     [name] test strip CHECK FINGERSTICK BLOOD  SUGARS 1 TIME A DAY 100 strip 3   Current Facility-Administered Medications on File Prior to Visit  Medication Dose Route Frequency Provider Last Rate Last Admin  . betamethasone acetate-betamethasone sodium phosphate (CELESTONE) injection 3 mg  3 mg Intramuscular Once Edrick Kins, DPM         Family Hx: The patient's family history includes Aneurysm in his paternal grandfather; Arthritis in his maternal grandmother and mother; COPD in his maternal uncle; Cancer in his brother, maternal grandmother,  and maternal uncle; Dementia in his mother; Diabetes in his paternal grandmother; Heart attack in his father; Heart disease in his father; High Cholesterol in his mother; Hyperlipidemia in his mother; Hypertension in his mother; Prostate cancer in his brother; Stroke in his paternal grandmother.  ROS:   Please see the history of present illness.    Review of Systems  Constitutional: Negative.   HENT: Negative.   Respiratory: Negative.   Cardiovascular: Negative.   Gastrointestinal: Negative.   Musculoskeletal: Negative.   Neurological: Negative.   Psychiatric/Behavioral: Negative.   All other systems reviewed and are negative.    Labs/Other Tests and Data Reviewed:    Recent Labs: 01/20/2019: ALT 19; BUN 11; Creatinine, Ser 0.78; Hemoglobin 15.1; Platelets 249; Potassium 4.3; Sodium 141   Recent Lipid Panel Lab Results  Component Value Date/Time   CHOL 238 (H) 01/20/2019 09:30 AM   TRIG 161 (H) 01/20/2019 09:30 AM   HDL 44 01/20/2019 09:30 AM   CHOLHDL 5.4 (H) 01/20/2019 09:30 AM   CHOLHDL 5.2 (H) 01/10/2017 09:13 AM   LDLCALC 165 (H) 01/20/2019 09:30 AM   LDLCALC 165 (H) 01/10/2017 09:13 AM    Wt Readings from Last 3 Encounters:  08/10/19 178 lb 6 oz (80.9 kg)  04/22/19 174 lb (78.9 kg)  02/04/19 174 lb (78.9 kg)     Exam:    Vital Signs: Vital signs may also be detailed in the HPI BP 128/64 (BP Location: Left Arm, Patient Position: Sitting, Cuff Size: Normal)   Pulse 91   Ht 5\' 7"  (1.702 m)   Wt 178 lb 6 oz (80.9 kg)   SpO2 99%   BMI 27.94 kg/m   Constitutional:  oriented to person, place, and time. No distress.  HENT:  Head: Grossly normal Eyes:  no discharge. No scleral icterus.  Neck: No JVD, no carotid bruits  Cardiovascular: Regular rate and rhythm, no murmurs appreciated Pulmonary/Chest: Clear to auscultation bilaterally, no wheezes or rails Abdominal: Soft.  no distension.  no tenderness.  Musculoskeletal: Normal range of motion Neurological:  normal  muscle tone. Coordination normal. No atrophy Skin: Skin warm and dry Psychiatric: normal affect, pleasant   ASSESSMENT & PLAN:    Problem List Items Addressed This Visit      Cardiology Problems   Carotid atherosclerosis (Chronic)   Hyperlipidemia, unspecified   HTN (hypertension)    Other Visit Diagnoses    PAD (peripheral artery disease) ([place])    -  Primary   Aortic atherosclerosis ([facility])         PAD/aortic atherosclerosis Seen on CT scan from 2012 moderate in the abdominal aorta, Prior smoker, hyperlipidemia not controlled Intolerant of statins including Crestor lovastatin WelChol with myalgias Carotid disease On Repatha  Coronary calcification At least moderate, seen on CT scan 2012 Particularly proximal LAD, proximal RCA Lipids today  Carotid stenosis 40 to 59% on the left Repatha Goal LDL less than 70 given PAD  Hyperlipidemia Statin intolerant on Repatha, Labs today  Diabetes type 2 We have encouraged continued exercise, careful diet management in an effort to lose weight. Off metformin, stomach problems   Total encounter time more than 25 minutes  Greater than 50% was spent in counseling and coordination of care with the patient  F/u 1 yrs  Signed, Ida Rogue, MD  [place] [address], [place], [place] [address]

## 2019-08-10 ENCOUNTER — Encounter: Payer: Self-pay | Admitting: Cardiovascular Disease

## 2019-08-10 ENCOUNTER — Other Ambulatory Visit: Payer: Self-pay

## 2019-08-10 ENCOUNTER — Ambulatory Visit (INDEPENDENT_AMBULATORY_CARE_PROVIDER_SITE_OTHER): Payer: Medicare Other | Admitting: Cardiovascular Disease

## 2019-08-10 VITALS — BP 128/64 | HR 91 | Ht 67.0 in | Wt 178.4 lb

## 2019-08-10 DIAGNOSIS — I700 Atherosclerosis of aorta: Secondary | ICD-10-CM | POA: Diagnosis not present

## 2019-08-10 DIAGNOSIS — I6529 Occlusion and stenosis of unspecified carotid artery: Secondary | ICD-10-CM | POA: Diagnosis not present

## 2019-08-10 DIAGNOSIS — I739 Peripheral vascular disease, unspecified: Secondary | ICD-10-CM | POA: Diagnosis not present

## 2019-08-10 DIAGNOSIS — I10 Essential (primary) hypertension: Secondary | ICD-10-CM

## 2019-08-10 DIAGNOSIS — E785 Hyperlipidemia, unspecified: Secondary | ICD-10-CM

## 2019-08-10 NOTE — Patient Instructions (Addendum)
Medication Instructions:  No changes  If you need a refill on your cardiac medications before your next appointment, please call your pharmacy.    Lab work: Lipids , HBA1C and LFTs    If you have labs (blood work) drawn today and your tests are completely normal, you will receive your results only by: Marland Kitchen MyChart Message (if you have MyChart) OR . A paper copy in the mail If you have any lab test that is abnormal or we need to change your treatment, we will call you to review the results.   Testing/Procedures: No new testing needed   Follow-Up: At [facility], you and your health needs are our priority.  As part of our continuing mission to provide you with exceptional heart care, we have created designated Provider Care Teams.  These Care Teams include your primary Cardiologist (physician) and Advanced Practice Providers (APPs -  Physician Assistants and Nurse Practitioners) who all work together to provide you with the care you need, when you need it.  . You will need a follow up appointment in 12 months   . Providers on your designated Care Team:   . Murray Hodgkins, NP . Christell Faith, PA-C . Marrianne Mood, PA-C  Any Other Special Instructions Will Be Listed Below (If Applicable).  For educational health videos Log in to : [email or web address] Or : [email or web address], password : [email or web address]

## 2019-08-11 ENCOUNTER — Ambulatory Visit: Payer: Medicare Other | Admitting: Pharmacist

## 2019-08-11 DIAGNOSIS — E785 Hyperlipidemia, unspecified: Secondary | ICD-10-CM

## 2019-08-11 DIAGNOSIS — B351 Tinea unguium: Secondary | ICD-10-CM

## 2019-08-11 LAB — LIPID PANEL
Chol/HDL Ratio: 2.5 ratio (ref 0.0–5.0)
Cholesterol, Total: 122 mg/dL (ref 100–199)
HDL: 48 mg/dL (ref >39)
LDL Chol Calc (NIH): 55 mg/dL (ref 0–99)
Triglycerides: 105 mg/dL (ref 0–149)
VLDL Cholesterol Cal: 19 mg/dL (ref 5–40)

## 2019-08-11 LAB — HEPATIC FUNCTION PANEL
ALT: 24 IU/L (ref 0–44)
AST: 18 IU/L (ref 0–40)
Albumin: 4.3 g/dL (ref 3.7–4.7)
Alkaline Phosphatase: 62 IU/L (ref 48–121)
Bilirubin Total: 0.6 mg/dL (ref 0.0–1.2)
Bilirubin, Direct: 0.19 mg/dL (ref 0.00–0.40)
Total Protein: 6.9 g/dL (ref 6.0–8.5)

## 2019-08-11 LAB — HEMOGLOBIN A1C
Est. average glucose Bld gHb Est-mCnc: 157 mg/dL
Hgb A1c MFr Bld: 7.1 % — ABNORMAL HIGH (ref 4.8–5.6)

## 2019-08-11 NOTE — Chronic Care Management (AMB) (Signed)
Chronic Care Management Pharmacy  Name: Gerald Bauer  MRN: [id number] DOB: 1945/05/02  Chief Complaint/ HPI  Gerald Bauer,  74 y.o. , male presents for their Initial CCM visit with the clinical pharmacist via telephone due to COVID-19 Pandemic.  PCP : [name], PA-C  Their chronic conditions include: DM, HLD  Office Visits: 1/27 DM, [place], BP 108/83 P 86 Wt 174 BMI 27.3, didn't want metformin XR  Consult Visit: 5/17 PAD, [place], BP 128/64 P 91 Wt 178 BMI 27.9, A1c 6.8%, active, off metformin (GI)  Medications: Outpatient Encounter Medications as of 08/11/2019  Medication Sig Note  . amLODipine (NORVASC) 5 MG tablet TAKE 1 AND 1/2 TABLETS BY  MOUTH DAILY   . aspirin 81 MG tablet Take 81 mg by mouth daily.   . Evolocumab (REPATHA SURECLICK) 284 MG/ML SOAJ Inject 140 mg into the skin every 14 (fourteen) days.   . famotidine (PEPCID) 20 MG tablet TAKE 1 TABLET BY MOUTH 2  TIMES DAILY FOR HEARTBURN   . Lancets (ONETOUCH DELICA PLUS [id number]) MISC USE TO CHECK BLOOD GLUCOSE  ONCE A DAY AS DIRECTED   . Omega-3 Fatty Acids (FISH OIL) 1200 MG CAPS Take 1 capsule by mouth daily.    [name] test strip CHECK FINGERSTICK BLOOD  SUGARS 1 TIME A DAY   . cholecalciferol (VITAMIN D) 1000 units tablet Take 1 tablet (1,000 Units total) by mouth daily. (Patient not taking: Reported on 08/11/2019)   . Cyanocobalamin (VITAMIN B-12 CR PO) Take 1,000 mcg by mouth daily.  03/09/2014: Received from: External Pharmacy   Facility-Administered Encounter Medications as of 08/11/2019  Medication  . betamethasone acetate-betamethasone sodium phosphate (CELESTONE) injection 3 mg     Social Connections: Unknown  . Frequency of Communication with Friends and Family: More than three times a week  . Frequency of Social Gatherings with Friends and Family: Three times a week  . Attends Religious Services: More than 4 times per year  . Active Member of Clubs or Organizations: No  . Attends English as a second language teacher Meetings: Never  . Marital Status: Not on file    Current Diagnosis/Assessment:  Goals Addressed            This Visit's Progress   . Diabetes - goal A1c < 7%       CARE PLAN ENTRY (see longitudinal plan of care for additional care plan information)  Current Barriers:  . Diabetes: type 2; complicated by chronic medical conditions including hyperlipidemia Lab Results  Component Value Date   HGBA1C 7.1 (H) 08/10/2019 .   Lab Results  Component Value Date   CREATININE 0.78 01/20/2019   CREATININE 0.91 01/13/2018   CREATININE 0.90 07/11/2017 .   Marland Kitchen No results found for: EGFR . Current antihyperglycemic regimen: lifestyle . Denies hypoglycemic symptoms, including dizziness, lightheadedness, shaking, sweating . Denies hyperglycemic symptoms, including polyuria, polydipsia, polyphagia, nocturia, blurred vision, neuropathy . Current exercise: active outdoor lifestyle . Current blood glucose readings: 167, 160, 157, 160, 154 . Stopped metformin due to diarrhea  Pharmacist Clinical Goal(s):  Marland Kitchen Over the next 90 days, patient will work with PharmD and primary care provider to address restarting oral diabetes medication . Mild therapeutic effort to move A1c 7.1% to < 7% goal  Interventions: . Comprehensive medication review performed, medication list updated in electronic medical record . Inter-disciplinary care team collaboration (see longitudinal plan of care) . Restart metformin XR '500mg'$  1 tab by mouth daily  Patient Self Care Activities:  .  Patient will focus on medication adherence by taking metformin with food . Patient will take medications as prescribed . Patient will contact provider with any episodes of hypoglycemia . Patient will report any questions or concerns to provider   Initial goal documentation     . Hyperlipidemia - goal LDL < 70       CARE PLAN ENTRY (see longitudinal plan of care for additional care plan information)  Current Barriers:    . Controlled hyperlipidemia, complicated by diabetes, statin intolerance . Current antihyperlipidemic regimen: Repatha . Previous antihyperlipidemic medications tried statin medication . Most recent lipid panel:     Component Value Date/Time   CHOL 122 08/10/2019 0826   TRIG 105 08/10/2019 0826   HDL 48 08/10/2019 0826   CHOLHDL 2.5 08/10/2019 0826   CHOLHDL 5.2 (H) 01/10/2017 0913   VLDL 27 07/11/2016 0931   LDLCALC 55 08/10/2019 0826   LDLCALC 165 (H) 01/10/2017 0913 .    Pharmacist Clinical Goal(s):  Marland Kitchen Over the next 90 days, patient will work with PharmD and providers towards optimized antihyperlipidemic therapy  Interventions: . Comprehensive medication review performed; medication list updated in electronic medical record.  [name] care team collaboration (see longitudinal plan of care) . Draw lipid panel to confirm Repatha at goal  Patient Self Care Activities:  . Patient will focus on medication adherence by continuing Repatha injections even if costly  Initial goal documentation        Diabetes   Recent Relevant Labs: Lab Results  Component Value Date/Time   HGBA1C 7.1 (H) 08/10/2019 08:26 AM   HGBA1C 6.8 (A) 01/20/2019 09:09 AM   HGBA1C 8.8 10/21/2018 12:00 AM   HGBA1C 6.8 (H) 01/13/2018 09:41 AM   MICROALBUR 20 01/20/2019 09:16 AM   MICROALBUR 0.4 01/10/2017 09:13 AM     Checking BG: Daily Feels low at 86  Recent FBG Readings: 167, 160, 157, 160, 154 Patient has failed these meds in past: metformin GI Patient is currently uncontrolled on the following medications: None  Last diabetic Foot exam:  Lab Results  Component Value Date/Time   HMDIABEYEEXA No Retinopathy 04/10/2019 12:00 AM    Last diabetic Eye exam: No results found for: HMDIABFOOTEX   We discussed:   Couldn't tolerate metformin Not at goal  Plan  Restart metformin XR 500 mg 1 tab by mouth daily   Hyperlipidemia   Lipid Panel     Component Value Date/Time    CHOL 122 08/10/2019 0826   TRIG 105 08/10/2019 0826   HDL 48 08/10/2019 0826   CHOLHDL 2.5 08/10/2019 0826   CHOLHDL 5.2 (H) 01/10/2017 0913   VLDL 27 07/11/2016 0931   LDLCALC 55 08/10/2019 0826   LDLCALC 165 (H) 01/10/2017 0913   LABVLDL 19 08/10/2019 0826     The ASCVD Risk score (Goff DC Jr., et al., 2013) failed to calculate for the following reasons:   The valid total cholesterol range is 130 to 320 mg/dL   Patient has failed these meds in past: statins Patient is currently controlled on the following medications: Repatha  We discussed:    Repatha doesn't have indication for monotherapy. Ideally would also be on a statin medication.  Plan  Continue current medications   Medication Management   Pt uses [facility] pharmacy for all medications Uses pill box? Yes Pt endorses 100% compliance Disadvantaged  We discussed: Amlodipine '5mg'$  daily Wife killed by idiot driver Pepcid once daily Doesn't use Flonase  Not taking metformin  Plan  Continue current  medication management strategy  Follow up: 3 month phone visit  Milus Height, PharmD, [place], [facility] [phone]

## 2019-08-11 NOTE — Patient Instructions (Addendum)
Visit Information  Goals Addressed            This Visit's Progress   . Diabetes - goal A1c < 7%       CARE PLAN ENTRY (see longitudinal plan of care for additional care plan information)  Current Barriers:  . Diabetes: type 2; complicated by chronic medical conditions including hyperlipidemia Lab Results  Component Value Date   HGBA1C 7.1 (H) 08/10/2019 .   Lab Results  Component Value Date   CREATININE 0.78 01/20/2019   CREATININE 0.91 01/13/2018   CREATININE 0.90 07/11/2017 .   Marland Kitchen No results found for: EGFR . Current antihyperglycemic regimen: lifestyle . Denies hypoglycemic symptoms, including dizziness, lightheadedness, shaking, sweating . Denies hyperglycemic symptoms, including polyuria, polydipsia, polyphagia, nocturia, blurred vision, neuropathy . Current exercise: active outdoor lifestyle . Current blood glucose readings: 167, 160, 157, 160, 154 . Stopped metformin due to diarrhea  Pharmacist Clinical Goal(s):  Marland Kitchen Over the next 90 days, patient will work with PharmD and primary care provider to address restarting oral diabetes medication . Mild therapeutic effort to move A1c 7.1% to < 7% goal  Interventions: . Comprehensive medication review performed, medication list updated in electronic medical record . Inter-disciplinary care team collaboration (see longitudinal plan of care) . Restart metformin XR '500mg'$  1 tab by mouth daily  Patient Self Care Activities:  . Patient will focus on medication adherence by taking metformin with food . Patient will take medications as prescribed . Patient will contact provider with any episodes of hypoglycemia . Patient will report any questions or concerns to provider   Initial goal documentation     . Hyperlipidemia - goal LDL < 70       CARE PLAN ENTRY (see longitudinal plan of care for additional care plan information)  Current Barriers:  . Controlled hyperlipidemia, complicated by diabetes, statin  intolerance . Current antihyperlipidemic regimen: Repatha . Previous antihyperlipidemic medications tried statin medication . Most recent lipid panel:     Component Value Date/Time   CHOL 122 08/10/2019 0826   TRIG 105 08/10/2019 0826   HDL 48 08/10/2019 0826   CHOLHDL 2.5 08/10/2019 0826   CHOLHDL 5.2 (H) 01/10/2017 0913   VLDL 27 07/11/2016 0931   LDLCALC 55 08/10/2019 0826   LDLCALC 165 (H) 01/10/2017 0913 .    Pharmacist Clinical Goal(s):  Marland Kitchen Over the next 90 days, patient will work with PharmD and providers towards optimized antihyperlipidemic therapy  Interventions: . Comprehensive medication review performed; medication list updated in electronic medical record.  [name] care team collaboration (see longitudinal plan of care) . Draw lipid panel to confirm Repatha at goal  Patient Self Care Activities:  . Patient will focus on medication adherence by continuing Repatha injections even if costly  Initial goal documentation        Gerald Bauer was given information about Chronic Care Management services today including:  1. CCM service includes personalized support from designated clinical staff supervised by his physician, including individualized plan of care and coordination with other care providers 2. 24/7 contact phone numbers for assistance for urgent and routine care needs. 3. Standard insurance, coinsurance, copays and deductibles apply for chronic care management only during months in which we provide at least 20 minutes of these services. Most insurances cover these services at 100%, however patients may be responsible for any copay, coinsurance and/or deductible if applicable. This service may help you avoid the need for more expensive face-to-face services. 4. Only one practitioner may furnish  and bill the service in a calendar month. 5. The patient may stop CCM services at any time (effective at the end of the month) by phone call to the office  staff.  Patient agreed to services and verbal consent obtained.   Print copy of patient instructions provided.  Telephone follow up appointment with pharmacy team member scheduled for: 3 months  Milus Height, PharmD, [place], [facility] [phone]  Food Choices for Gastroesophageal Reflux Disease, Adult When you have gastroesophageal reflux disease (GERD), the foods you eat and your eating habits are very important. Choosing the right foods can help ease the discomfort of GERD. Consider working with a diet and nutrition specialist (dietitian) to help you make healthy food choices. What general guidelines should I follow?  Eating plan  Choose healthy foods low in fat, such as fruits, vegetables, whole grains, low-fat dairy products, and lean meat, fish, and poultry.  Eat frequent, small meals instead of three large meals each day. Eat your meals slowly, in a relaxed setting. Avoid bending over or lying down until 2-3 hours after eating.  Limit high-fat foods such as fatty meats or fried foods.  Limit your intake of oils, butter, and shortening to less than 8 teaspoons each day.  Avoid the following: ? Foods that cause symptoms. These may be different for different people. Keep a food diary to keep track of foods that cause symptoms. ? Alcohol. ? Drinking large amounts of liquid with meals. ? Eating meals during the 2-3 hours before bed.  Cook foods using methods other than frying. This may include baking, grilling, or broiling. Lifestyle  Maintain a healthy weight. Ask your health care provider what weight is healthy for you. If you need to lose weight, work with your health care provider to do so safely.  Exercise for at least 30 minutes on 5 or more days each week, or as told by your health care provider.  Avoid wearing clothes that fit tightly around your waist and chest.  Do not use any products that contain nicotine or tobacco, such  as cigarettes and e-cigarettes. If you need help quitting, ask your health care provider.  Sleep with the head of your bed raised. Use a wedge under the mattress or blocks under the bed frame to raise the head of the bed. What foods are not recommended? The items listed may not be a complete list. Talk with your dietitian about what dietary choices are best for you. Grains Pastries or quick breads with added fat. [place] toast. Vegetables Deep fried vegetables. [place] fries. Any vegetables prepared with added fat. Any vegetables that cause symptoms. For some people this may include tomatoes and tomato products, chili peppers, onions and garlic, and horseradish. Fruits Any fruits prepared with added fat. Any fruits that cause symptoms. For some people this may include citrus fruits, such as oranges, grapefruit, pineapple, and lemons. Meats and other protein foods High-fat meats, such as fatty beef or pork, hot dogs, ribs, ham, sausage, salami and bacon. Fried meat or protein, including fried fish and fried chicken. Nuts and nut butters. Dairy Whole milk and chocolate milk. Sour cream. Cream. Ice cream. Cream cheese. Milk shakes. Beverages Coffee and tea, with or without caffeine. Carbonated beverages. Sodas. Energy drinks. Fruit juice made with acidic fruits (such as orange or grapefruit). Tomato juice. Alcoholic drinks. Fats and oils Butter. Margarine. Shortening. Ghee. Sweets and desserts Chocolate and cocoa. Donuts. Seasoning and other foods Pepper. Peppermint and spearmint. Any condiments,  herbs, or seasonings that cause symptoms. For some people, this may include curry, hot sauce, or vinegar-based salad dressings. Summary  When you have gastroesophageal reflux disease (GERD), food and lifestyle choices are very important to help ease the discomfort of GERD.  Eat frequent, small meals instead of three large meals each day. Eat your meals slowly, in a relaxed setting. Avoid bending over  or lying down until 2-3 hours after eating.  Limit high-fat foods such as fatty meat or fried foods. This information is not intended to replace advice given to you by your health care provider. Make sure you discuss any questions you have with your health care provider. Document Revised: 07/03/2018 Document Reviewed: 03/13/2016 Elsevier Patient Education  [place].

## 2019-09-10 ENCOUNTER — Ambulatory Visit (INDEPENDENT_AMBULATORY_CARE_PROVIDER_SITE_OTHER): Payer: Medicare Other

## 2019-09-10 ENCOUNTER — Other Ambulatory Visit: Payer: Self-pay

## 2019-09-10 VITALS — BP 142/92 | HR 109 | Temp 97.3°F | Ht 67.0 in | Wt 177.9 lb

## 2019-09-10 DIAGNOSIS — Z0000 Encounter for general adult medical examination without abnormal findings: Secondary | ICD-10-CM

## 2019-09-10 NOTE — Progress Notes (Signed)
Subjective:   Gerald Bauer is a 74 y.o. male who presents for Medicare Annual/Subsequent preventive examination.  Review of Systems:   Cardiac Risk Factors include: advanced age (>80mn, >>32women);male gender;diabetes mellitus;dyslipidemia     Objective:    Vitals: BP (!) 142/92   Pulse (!) 109   Temp (!) 97.3 F (36.3 C)   Ht _0  (1.702 m)   Wt 177 lb 14.4 oz (80.7 kg)   BMI 27.86 kg/m   Body mass index is 27.86 kg/m.  Advanced Directives 09/10/2019 09/09/2018 09/03/2017 01/10/2017 08/01/2016 07/11/2016 03/13/2016  Does Patient Have a Medical Advance Directive? _1  No No  Does patient want to make changes to medical advance directive? No - Patient declined - - - - - -  Would patient like information on creating a medical advance directive? - No - Patient declined Yes (MAU/Ambulatory/Procedural Areas - Information given) - No - Patient declined - -    Tobacco Social History   Tobacco Use  Smoking Status Former Smoker  . Packs/day: 1.00  . Years: 40.00  . Pack years: 40.00  . Types: Cigarettes  . Quit date: 03/09/1997  . Years since quitting: 22.5  Smokeless Tobacco Former USystems developer Tobacco Comment   smoking cessation materials not required     Counseling given: Not Answered Comment: smoking cessation materials not required   Clinical Intake:     Pain : No/denies pain     BMI - recorded: 27.86 Nutritional Risks: None Diabetes: Yes CBG done?: No CBG resulted in Enter/ Edit results?: No Did pt. bring in CBG monitor from home?: No   Nutrition Risk Assessment:  Has the patient had any N/V/D within the last 2 months?  No  Does the patient have any non-healing wounds?  No  Has the patient had any unintentional weight loss or weight gain?  No   Diabetes:  Is the patient diabetic?  Yes  If diabetic, was a CBG obtained today?  No  Did the patient bring in their glucometer from home?  No  How often do you monitor your CBG's? Daily fasting.    Financial Strains and Diabetes Management:  Are you having any financial strains with the device, your supplies or your medication? No .  Does the patient want to be seen by Chronic Care Management for management of their diabetes?  Yes  - currently enrolled with CCM pharmacist Would the patient like to be referred to a Nutritionist or for Diabetic Management?  No   Diabetic Exams:  Diabetic Eye Exam: Completed 04/10/19.   Diabetic Foot Exam: Completed 01/20/19.  How often do you need to have someone help you when you read instructions, pamphlets, or other written materials from your doctor or pharmacy?: 1 - Never  Interpreter Needed?: No  Information entered by :: TDayton Martes Past Medical History:  Diagnosis Date  . Acid reflux   . Arthritis    hands  . Carotid atherosclerosis   . Diabetes mellitus without complication ([place]   . Hyperlipemia   . Hypertension   . [place]state cancer ([place]    history  . Tachycardia    Past Surgical History:  Procedure Laterality Date  . APPENDECTOMY  1973  . CHOLECYSTECTOMY  2013  . PROSTATECTOMY  2012   Family History  Problem Relation Age of Onset  . Dementia Mother   . High Cholesterol Mother   . Hypertension Mother   . Arthritis Mother   . Hyperlipidemia  Mother   . Heart disease Father   . Heart attack Father   . Arthritis Maternal Grandmother   . Cancer Maternal Grandmother        colon  . Prostate cancer Brother   . Cancer Brother        prostate  . Cancer Maternal Uncle        colon  . COPD Maternal Uncle   . Stroke Paternal Grandmother   . Diabetes Paternal Grandmother   . Aneurysm Paternal Grandfather    Social History   Socioeconomic History  . Marital status: Widowed    Spouse name: Regino Schultze  . Number of children: 2  . Years of education: some college  . Highest education level: 12th grade  Occupational History  . Occupation: Retired    Comment: [employer]  Tobacco Use  . Smoking status: Former  Smoker    Packs/day: 1.00    Years: 40.00    Pack years: 40.00    Types: Cigarettes    Quit date: 03/09/1997    Years since quitting: 22.5  . Smokeless tobacco: Former Systems developer  . Tobacco comment: smoking cessation materials not required  Vaping Use  . Vaping Use: Never used  Substance and Sexual Activity  . Alcohol use: No    Alcohol/week: 0.0 standard drinks  . Drug use: No  . Sexual activity: Never  Other Topics Concern  . Not on file  Social History Narrative  . Not on file   Social Determinants of Health   Financial Resource Strain:   . Difficulty of Paying Living Expenses:   Food Insecurity:   . Worried About Charity fundraiser in the Last Year:   . Arboriculturist in the Last Year:   Transportation Needs:   . Film/video editor (Medical):   Marland Kitchen Lack of Transportation (Non-Medical):   Physical Activity: Inactive  . Days of Exercise per Week: 0 days  . Minutes of Exercise per Session: 0 min  Stress: No Stress Concern Present  . Feeling of Stress : Not at all  Social Connections:   . Frequency of Communication with Friends and Family:   . Frequency of Social Gatherings with Friends and Family:   . Attends Religious Services:   . Active Member of Clubs or Organizations:   . Attends Archivist Meetings:   Marland Kitchen Marital Status:     Outpatient Encounter Medications as of 09/10/2019  Medication Sig  . amLODipine (NORVASC) 5 MG tablet TAKE 1 AND 1/2 TABLETS BY  MOUTH DAILY  . aspirin 81 MG tablet Take 81 mg by mouth daily.  . cholecalciferol (VITAMIN D) 1000 units tablet Take 1 tablet (1,000 Units total) by mouth daily.  . Cyanocobalamin (VITAMIN B-12 CR PO) Take 1,000 mcg by mouth daily.   . Evolocumab (REPATHA SURECLICK) 865 MG/ML SOAJ Inject 140 mg into the skin every 14 (fourteen) days.  . famotidine (PEPCID) 20 MG tablet TAKE 1 TABLET BY MOUTH 2  TIMES DAILY FOR HEARTBURN  . Lancets (ONETOUCH DELICA PLUS [id number]) MISC USE TO CHECK BLOOD GLUCOSE  ONCE A  DAY AS DIRECTED  . Omega-3 Fatty Acids (FISH OIL) 1200 MG CAPS Take 1 capsule by mouth daily.   [name] test strip CHECK FINGERSTICK BLOOD  SUGARS 1 TIME A DAY   Facility-Administered Encounter Medications as of 09/10/2019  Medication  . betamethasone acetate-betamethasone sodium phosphate (CELESTONE) injection 3 mg    Activities of Daily Living In your present state of health,  do you have any difficulty performing the following activities: 09/10/2019 04/22/2019  Hearing? Y Y  Comment declines hearing aids "a little bit"  Vision? N N  Difficulty concentrating or making decisions? N N  Walking or climbing stairs? N N  Dressing or bathing? N N  Doing errands, shopping? N N  Preparing Food and eating ? N -  Using the Toilet? N -  In the past six months, have you accidently leaked urine? N -  Do you have problems with loss of bowel control? N -  Managing your Medications? N -  Managing your Finances? N -  Housekeeping or managing your Housekeeping? N -  Some recent data might be hidden    Patient Care Team: [name], PA-C as PCP - General (Family Medicine) Lucky Cowboy, Erskine Squibb, MD as Referring Physician (Vascular Surgery) [name], MD as Consulting Physician (Gastroenterology) Verdia Kuba, [facility] (Pharmacist)   Assessment:   This is a routine wellness examination for Versie.  Exercise Activities and Dietary recommendations Current Exercise Habits: Home exercise routine, Type of exercise: walking, Frequency (Times/Week): 5, Intensity: Mild, Exercise limited by: None identified  Goals    . Diabetes - goal A1c < 7%     CARE PLAN ENTRY (see longitudinal plan of care for additional care plan information)  Current Barriers:  . Diabetes: type 2; complicated by chronic medical conditions including hyperlipidemia Lab Results  Component Value Date   HGBA1C 7.1 (H) 08/10/2019 .   Lab Results  Component Value Date   CREATININE 0.78 01/20/2019   CREATININE 0.91 01/13/2018    CREATININE 0.90 07/11/2017 .   Marland Kitchen No results found for: EGFR . Current antihyperglycemic regimen: lifestyle . Denies hypoglycemic symptoms, including dizziness, lightheadedness, shaking, sweating . Denies hyperglycemic symptoms, including polyuria, polydipsia, polyphagia, nocturia, blurred vision, neuropathy . Current exercise: active outdoor lifestyle . Current blood glucose readings: 167, 160, 157, 160, 154 . Stopped metformin due to diarrhea  Pharmacist Clinical Goal(s):  Marland Kitchen Over the next 90 days, patient will work with PharmD and primary care provider to address restarting oral diabetes medication . Mild therapeutic effort to move A1c 7.1% to < 7% goal  Interventions: . Comprehensive medication review performed, medication list updated in electronic medical record . Inter-disciplinary care team collaboration (see longitudinal plan of care) . Restart metformin XR 522m 1 tab by mouth daily  Patient Self Care Activities:  . Patient will focus on medication adherence by taking metformin with food . Patient will take medications as prescribed . Patient will contact provider with any episodes of hypoglycemia . Patient will report any questions or concerns to provider   Initial goal documentation     . DIET - INCREASE WATER INTAKE     Recommend to drink at least 6-8 8oz glasses of water per day.    . Hyperlipidemia - goal LDL < 70     CARE PLAN ENTRY (see longitudinal plan of care for additional care plan information)  Current Barriers:  . Controlled hyperlipidemia, complicated by diabetes, statin intolerance . Current antihyperlipidemic regimen: Repatha . Previous antihyperlipidemic medications tried statin medication . Most recent lipid panel:     Component Value Date/Time   CHOL 122 08/10/2019 0826   TRIG 105 08/10/2019 0826   HDL 48 08/10/2019 0826   CHOLHDL 2.5 08/10/2019 0826   CHOLHDL 5.2 (H) 01/10/2017 0913   VLDL 27 07/11/2016 0931   LDLCALC 55 08/10/2019 0826    LDLCALC 165 (H) 01/10/2017 0913 .    Pharmacist  Clinical Goal(s):  Marland Kitchen Over the next 90 days, patient will work with PharmD and providers towards optimized antihyperlipidemic therapy  Interventions: . Comprehensive medication review performed; medication list updated in electronic medical record.  [name] care team collaboration (see longitudinal plan of care) . Draw lipid panel to confirm Repatha at goal  Patient Self Care Activities:  . Patient will focus on medication adherence by continuing Repatha injections even if costly  Initial goal documentation     . Patient Stated     Continue walking daily for rexercise       Fall Risk Fall Risk  09/10/2019 04/22/2019 01/20/2019 09/09/2018 07/15/2018  Falls in the past year? 0 0 0 0 0  Comment - - - - -  Number falls in past yr: 0 0 0 0 -  Injury with Fall? 0 0 0 0 -  Comment - - - - -  Risk for fall due to : - - - - -  Follow up - - - Falls prevention discussed -  FALL RISK PREVENTION PERTAINING TO THE HOME:  Any stairs in or around the home? Yes  If so, are there any without handrails? Yes   Home free of loose throw rugs in walkways, pet beds, electrical cords, etc? Yes  Adequate lighting in your home to reduce risk of falls? Yes   ASSISTIVE DEVICES UTILIZED TO PREVENT FALLS:  Life alert? No  Use of a cane, walker or w/c? No  Grab bars in the bathroom? Yes  Shower chair or bench in shower? Yes  Elevated toilet seat or a handicapped toilet? No   DME ORDERS:  DME order needed?  No   TIMED UP AND GO:  Was the test performed? Yes .  Length of time to ambulate 10 feet: 5 sec.   GAIT:  Appearance of gait: Gait steady and fast without the use of an assistive device.   Education: Fall risk prevention has been discussed.  Intervention(s) required? No     Depression Screen PHQ 2/9 Scores 09/10/2019 04/22/2019 01/20/2019 09/09/2018  PHQ - 2 Score 0 0 0 0  PHQ- 9 Score - 1 0 -    Cognitive Function MMSE -  Mini Mental State Exam 03/09/2014  Orientation to time 5  Orientation to Place 4  Registration 3  Attention/ Calculation 5  Recall 2  Language- name 2 objects 2  Language- repeat 1  Language- follow 3 step command 3  Language- read & follow direction 1  Write a sentence 1  Copy design 1  Total score 28     6CIT Screen 09/10/2019 09/09/2018 09/03/2017 07/11/2016  What Year? 0 points 0 points 0 points 0 points  What month? 0 points 0 points 0 points 0 points  What time? - 0 points 0 points 0 points  Count back from 20 0 points 0 points 0 points 0 points  Months in reverse 0 points 0 points 0 points 0 points  Repeat phrase 0 points 0 points 2 points 4 points  Total Score - 0 2 4    Immunization History  Administered Date(s) Administered  . Fluad Quad(high Dose 65+) 01/20/2019  . Influenza, High Dose Seasonal PF 01/10/2017, 01/13/2018  . Influenza-Unspecified 01/10/2012, 01/01/2013, 01/01/2015  . Janssen (J&J) SARS-COV-2 Vaccination 09/08/2019  . Pneumococcal Conjugate-13 07/30/2013  . Pneumococcal Polysaccharide-23 02/27/2011  . Td 03/04/2014  . Zoster 02/17/2014  . Zoster Recombinat (Shingrix) 07/01/2016, 01/31/2017    Qualifies for Shingles Vaccine? Yes  Shingrix.  Tdap: Up to date  Flu Vaccine: Up to date  Pneumococcal Vaccine: Up to date  Covid-19 Vaccine: Up to date     Screening Tests Health Maintenance  Topic Date Due  . COLONOSCOPY  01/20/2020 (Originally 10/01/2018)  . INFLUENZA VACCINE  10/25/2019  . FOOT EXAM  01/20/2020  . URINE MICROALBUMIN  01/20/2020  . HEMOGLOBIN A1C  02/10/2020  . OPHTHALMOLOGY EXAM  04/09/2020  . TETANUS/TDAP  03/04/2024  . COVID-19 Vaccine  Completed  . Hepatitis C Screening  Completed  . PNA vac Low Risk Adult  Completed   Cancer Screenings:  Colorectal Screening: Completed 09/30/13. Repeat every 5 years; pt declines repeat screening at this time.   Lung Cancer Screening: (Low Dose CT Chest recommended if Age 53-80 years,  30 pack-year currently smoking OR have quit w/in 15years.) does not qualify.    Additional Screening:  Hepatitis C Screening: does qualify; Completed 05/09/15  Vision Screening: Recommended annual ophthalmology exams for early detection of glaucoma and other disorders of the eye. Is the patient up to date with their annual eye exam?  Yes  Who is the provider or what is the name of the office in which the pt attends annual eye exams? [place] Screening: Recommended annual dental exams for proper oral hygiene  Community Resource Referral:  CRR required this visit?  No       Plan:     I have personally reviewed and addressed the Medicare Annual Wellness questionnaire and have noted the following in the patient's chart:  A. Medical and social history B. Use of alcohol, tobacco or illicit drugs  C. Current medications and supplements D. Functional ability and status E.  Nutritional status F.  Physical activity G. Advance directives H. List of other physicians I.  Hospitalizations, surgeries, and ER visits in previous 12 months J.  Superior such as hearing and vision if needed, cognitive and depression L. Referrals and appointments   In addition, I have reviewed and discussed with patient certain preventive protocols, quality metrics, and best practice recommendations. A written personalized care plan for preventive services as well as general preventive health recommendations were provided to patient.   Signed,  Clemetine Marker, LPN Nurse Health Advisor   Nurse Notes: pt states he discontinued metformin several weeks ago due to GI upset and he is not taking any other diabetic medications. He checks his blood sugar once daily, usually fasting in am and has had blood sugars ranging from 145-165 since stopping metformin. Pt advised to schedule follow up appt with Leisa to discuss and patient schedule for 10/21/19. Also provided patient with handouts for  healthy eating and diabetic foot care.

## 2019-09-10 NOTE — Patient Instructions (Addendum)
Gerald Bauer , Thank you for taking time to come for your Medicare Wellness Visit. I appreciate your ongoing commitment to your health goals. Please review the following plan we discussed and let me know if I can assist you in the future.   Screening recommendations/referrals: Colonoscopy: 09/30/13. Pt declines repeat screening at this time.  Recommended yearly ophthalmology/optometry visit for glaucoma screening and checkup Recommended yearly dental visit for hygiene and checkup  Vaccinations: Influenza vaccine:done 01/20/19 Pneumococcal vaccine: done 07/30/13 Tdap vaccine: done 03/04/14 Shingles vaccine:done 02/17/14, 01/31/17   Covid-19: completed 09/08/19  Advanced directives: Advance directive discussed with you today. Even though you declined this today please call our office should you change your mind and we can give you the proper paperwork for you to fill out.  Conditions/risks identified: Will continue exercise regimen.  Next appointment: Follow up in one year for your annual wellness visit.   Preventive Care 41 Years and Older, Male Preventive care refers to lifestyle choices and visits with your health care provider that can promote health and wellness. What does preventive care include?  A yearly physical exam. This is also called an annual well check.  Dental exams once or twice a year.  Routine eye exams. Ask your health care provider how often you should have your eyes checked.  Personal lifestyle choices, including:  Daily care of your teeth and gums.  Regular physical activity.  Eating a healthy diet.  Avoiding tobacco and drug use.  Limiting alcohol use.  Practicing safe sex.  Taking low doses of aspirin every day.  Taking vitamin and mineral supplements as recommended by your health care provider. What happens during an annual well check? The services and screenings done by your health care provider during your annual well check will depend on your age,  overall health, lifestyle risk factors, and family history of disease. Counseling  Your health care provider may ask you questions about your:  Alcohol use.  Tobacco use.  Drug use.  Emotional well-being.  Home and relationship well-being.  Sexual activity.  Eating habits.  History of falls.  Memory and ability to understand (cognition).  Work and work Statistician. Screening  You may have the following tests or measurements:  Height, weight, and BMI.  Blood pressure.  Lipid and cholesterol levels. These may be checked every 5 years, or more frequently if you are over 10 years old.  Skin check.  Lung cancer screening. You may have this screening every year starting at age 5 if you have a 30-pack-year history of smoking and currently smoke or have quit within the past 15 years.  Fecal occult blood test (FOBT) of the stool. You may have this test every year starting at age 18.  Flexible sigmoidoscopy or colonoscopy. You may have a sigmoidoscopy every 5 years or a colonoscopy every 10 years starting at age 26.  Prostate cancer screening. Recommendations will vary depending on your family history and other risks.  Hepatitis C blood test.  Hepatitis B blood test.  Sexually transmitted disease (STD) testing.  Diabetes screening. This is done by checking your blood sugar (glucose) after you have not eaten for a while (fasting). You may have this done every 1-3 years.  Abdominal aortic aneurysm (AAA) screening. You may need this if you are a current or former smoker.  Osteoporosis. You may be screened starting at age 46 if you are at high risk. Talk with your health care provider about your test results, treatment options, and if necessary, the  need for more tests. Vaccines  Your health care provider may recommend certain vaccines, such as:  Influenza vaccine. This is recommended every year.  Tetanus, diphtheria, and acellular pertussis (Tdap, Td) vaccine. You may  need a Td booster every 10 years.  Zoster vaccine. You may need this after age 64.  Pneumococcal 13-valent conjugate (PCV13) vaccine. One dose is recommended after age 22.  Pneumococcal polysaccharide (PPSV23) vaccine. One dose is recommended after age 24. Talk to your health care provider about which screenings and vaccines you need and how often you need them. This information is not intended to replace advice given to you by your health care provider. Make sure you discuss any questions you have with your health care provider. Document Released: 04/08/2015 Document Revised: 11/30/2015 Document Reviewed: 01/11/2015 Elsevier Interactive Patient Education  2017 [place] Prevention in the Home Falls can cause injuries. They can happen to people of all ages. There are many things you can do to make your home safe and to help prevent falls. What can I do on the outside of my home?  Regularly fix the edges of walkways and driveways and fix any cracks.  Remove anything that might make you trip as you walk through a door, such as a raised step or threshold.  Trim any bushes or trees on the path to your home.  Use bright outdoor lighting.  Clear any walking paths of anything that might make someone trip, such as rocks or tools.  Regularly check to see if handrails are loose or broken. Make sure that both sides of any steps have handrails.  Any raised decks and porches should have guardrails on the edges.  Have any leaves, snow, or ice cleared regularly.  Use sand or salt on walking paths during winter.  Clean up any spills in your garage right away. This includes oil or grease spills. What can I do in the bathroom?  Use night lights.  Install grab bars by the toilet and in the tub and shower. Do not use towel bars as grab bars.  Use non-skid mats or decals in the tub or shower.  If you need to sit down in the shower, use a plastic, non-slip stool.  Keep the floor  dry. Clean up any water that spills on the floor as soon as it happens.  Remove soap buildup in the tub or shower regularly.  Attach bath mats securely with double-sided non-slip rug tape.  Do not have throw rugs and other things on the floor that can make you trip. What can I do in the bedroom?  Use night lights.  Make sure that you have a light by your bed that is easy to reach.  Do not use any sheets or blankets that are too big for your bed. They should not hang down onto the floor.  Have a firm chair that has side arms. You can use this for support while you get dressed.  Do not have throw rugs and other things on the floor that can make you trip. What can I do in the kitchen?  Clean up any spills right away.  Avoid walking on wet floors.  Keep items that you use a lot in easy-to-reach places.  If you need to reach something above you, use a strong step stool that has a grab bar.  Keep electrical cords out of the way.  Do not use floor polish or wax that makes floors slippery. If you must use wax,  use non-skid floor wax.  Do not have throw rugs and other things on the floor that can make you trip. What can I do with my stairs?  Do not leave any items on the stairs.  Make sure that there are handrails on both sides of the stairs and use them. Fix handrails that are broken or loose. Make sure that handrails are as long as the stairways.  Check any carpeting to make sure that it is firmly attached to the stairs. Fix any carpet that is loose or worn.  Avoid having throw rugs at the top or bottom of the stairs. If you do have throw rugs, attach them to the floor with carpet tape.  Make sure that you have a light switch at the top of the stairs and the bottom of the stairs. If you do not have them, ask someone to add them for you. What else can I do to help prevent falls?  Wear shoes that:  Do not have high heels.  Have rubber bottoms.  Are comfortable and fit you  well.  Are closed at the toe. Do not wear sandals.  If you use a stepladder:  Make sure that it is fully opened. Do not climb a closed stepladder.  Make sure that both sides of the stepladder are locked into place.  Ask someone to hold it for you, if possible.  Clearly mark and make sure that you can see:  Any grab bars or handrails.  First and last steps.  Where the edge of each step is.  Use tools that help you move around (mobility aids) if they are needed. These include:  Canes.  Walkers.  Scooters.  Crutches.  Turn on the lights when you go into a dark area. Replace any light bulbs as soon as they burn out.  Set up your furniture so you have a clear path. Avoid moving your furniture around.  If any of your floors are uneven, fix them.  If there are any pets around you, be aware of where they are.  Review your medicines with your doctor. Some medicines can make you feel dizzy. This can increase your chance of falling. Ask your doctor what other things that you can do to help prevent falls. This information is not intended to replace advice given to you by your health care provider. Make sure you discuss any questions you have with your health care provider. Document Released: 01/06/2009 Document Revised: 08/18/2015 Document Reviewed: 04/16/2014 Elsevier Interactive Patient Education  2017 [employer].

## 2019-10-11 ENCOUNTER — Other Ambulatory Visit: Payer: Self-pay | Admitting: Family Medicine

## 2019-10-11 DIAGNOSIS — E1165 Type 2 diabetes mellitus with hyperglycemia: Secondary | ICD-10-CM

## 2019-10-15 ENCOUNTER — Other Ambulatory Visit: Payer: Self-pay | Admitting: Family Medicine

## 2019-10-15 MED ORDER — AMLODIPINE BESYLATE 5 MG PO TABS: 5.0000 mg | ORAL_TABLET | Freq: Every day | ORAL | 3 refills | Status: DC

## 2019-10-15 MED ORDER — METFORMIN HCL ER 500 MG PO TB24: 500.0000 mg | ORAL_TABLET | Freq: Every day | ORAL | 1 refills | Status: DC

## 2019-10-15 MED ORDER — FAMOTIDINE 20 MG PO TABS: 20.0000 mg | ORAL_TABLET | Freq: Every day | ORAL | 3 refills | Status: DC

## 2019-10-15 NOTE — Progress Notes (Signed)
Pt has changed his meds per discussion with Pharmacist  Pt had one telephone visit in Jan and was asked to return in 3 months, but he has not come back in yet for in person OV or labs -  Pt does have an appt next month - med lists and labs will be done at that time.  Per staph message communication from Milus Height, PharmD:  Heide Spark,   Mr. Belcher was a real pleasure to work with today. We came up with some observations.   1. He had to stop metformin due to diarrhea but has agreed to try a low dose XR version.  Recommendation:  Start metformin XR 500mg  1 tab by mouth daily with a meal   2. His most recent lipid panel predates his Repatha therapy.  Recommendation:  Lipid panel at next scheduled lab draw   3. Please have your staff make some minor medication list corrections:  He is taking amlodipine 5mg  1 tab only  He is taking the famotidine 20mg  just once daily  He is no longer using the fluticasone nasal spray   I tried to mark anything else for removal   Thanks,   Sheppard Plumber, PharmD, [place], [facility]  [phone]    Adjustments made today - changes made only to med list for amlodipine and pepcid Sent in metformin XR - pt had previously refused to switch to this to see if he tolerated better - he has likely been w/o metformin for a few months?  Meds ordered this encounter  Medications   famotidine (PEPCID) 20 MG tablet    Sig: Take 1 tablet (20 mg total) by mouth daily.    Dispense:  90 tablet    Refill:  3    Requesting 1 year supply    Order Specific Question:   Supervising Provider    Answer:   Steele Sizer [[id number]]   amLODipine (NORVASC) 5 MG tablet    Sig: Take 1 tablet (5 mg total) by mouth daily.    Dispense:  90 tablet    Refill:  3    Requesting 1 year supply    Order Specific Question:   Supervising Provider    Answer:   Steele Sizer [[id number]]   metFORMIN (GLUCOPHAGE-XR) 500 MG 24 hr tablet     Sig: Take 1 tablet (500 mg total) by mouth daily with breakfast.    Dispense:  90 tablet    Refill:  1    Pt unable to tolerate the SE of regular metformin    Order Specific Question:   Supervising Provider    Answer:   Steele Sizer [[id number]]    [name], PA-C

## 2019-10-21 ENCOUNTER — Ambulatory Visit: Payer: Medicare Other | Admitting: Family Medicine

## 2019-11-09 ENCOUNTER — Encounter: Payer: Self-pay | Admitting: Family Medicine

## 2019-11-09 ENCOUNTER — Ambulatory Visit (INDEPENDENT_AMBULATORY_CARE_PROVIDER_SITE_OTHER): Payer: Medicare Other | Admitting: Family Medicine

## 2019-11-09 ENCOUNTER — Other Ambulatory Visit: Payer: Self-pay

## 2019-11-09 VITALS — BP 130/80 | HR 92 | Temp 98.2°F | Resp 18 | Ht 67.0 in | Wt 177.3 lb

## 2019-11-09 DIAGNOSIS — K219 Gastro-esophageal reflux disease without esophagitis: Secondary | ICD-10-CM | POA: Diagnosis not present

## 2019-11-09 DIAGNOSIS — Z5181 Encounter for therapeutic drug level monitoring: Secondary | ICD-10-CM

## 2019-11-09 DIAGNOSIS — E785 Hyperlipidemia, unspecified: Secondary | ICD-10-CM

## 2019-11-09 DIAGNOSIS — I10 Essential (primary) hypertension: Secondary | ICD-10-CM | POA: Diagnosis not present

## 2019-11-09 DIAGNOSIS — E119 Type 2 diabetes mellitus without complications: Secondary | ICD-10-CM | POA: Diagnosis not present

## 2019-11-09 NOTE — Progress Notes (Signed)
Name: Gerald Bauer   MRN: [id number]    DOB: 14-Nov-1945   Date:11/09/2019       Progress Note  Chief Complaint  Patient presents with  . Diabetes    follow up, metformin gives him abdominal pains  . Gastroesophageal Reflux     Subjective:   Gerald Bauer is a 74 y.o. male, presents to clinic for routine follow-up  DM - could not tolerate metformin, did not get or try XR metfomrin yet, sugars 130- 190s, the other day he had 190-300 after eating, a little relaxed on his sweets. Lab Results  Component Value Date   HGBA1C 7.1 (H) 08/10/2019  his  diabetes has been fairly well controlled over the last 1 to 2 years has been near 7 or under 7   HLD - on repatha per cardiology, seen Dr. Rockey Situ Patient states that he is in the donut hole and will no longer be able to afford Repatha injections for the rest of the year   Hypertension:  Currently managed on amlodipine 5 mg once daily Pt reports good med compliance and denies any SE.  No lightheadedness, hypotension, syncope. Blood pressure today is well controlled. BP Readings from Last 3 Encounters:  11/09/19 130/80  09/10/19 (!) 142/92  08/10/19 128/64   Pt denies CP, SOB, exertional sx, LE edema, palpitation, Ha's, visual disturbances  GERD - on famotidine 20 mg daily    Current Outpatient Medications:  .  amLODipine (NORVASC) 5 MG tablet, Take 1 tablet (5 mg total) by mouth daily., Disp: 90 tablet, Rfl: 3 .  aspirin 81 MG tablet, Take 81 mg by mouth daily., Disp: , Rfl:  .  cholecalciferol (VITAMIN D) 1000 units tablet, Take 1 tablet (1,000 Units total) by mouth daily., Disp: 90 tablet, Rfl: 3 .  Cyanocobalamin (VITAMIN B-12 CR PO), Take 1,000 mcg by mouth daily. , Disp: , Rfl:  .  Evolocumab (REPATHA SURECLICK) 086 MG/ML SOAJ, Inject 140 mg into the skin every 14 (fourteen) days., Disp: 2 pen, Rfl: 11 .  famotidine (PEPCID) 20 MG tablet, Take 1 tablet (20 mg total) by mouth daily., Disp: 90 tablet, Rfl: 3 .  Omega-3  Fatty Acids (FISH OIL) 1200 MG CAPS, Take 1 capsule by mouth daily. , Disp: , Rfl:  .  Lancets (ONETOUCH DELICA PLUS [id number]) MISC, USE TO CHECK BLOOD GLUCOSE  ONCE DAILY AS DIRECTED (Patient not taking: Reported on 11/09/2019), Disp: 100 each, Rfl: 3 .  metFORMIN (GLUCOPHAGE-XR) 500 MG 24 hr tablet, Take 1 tablet (500 mg total) by mouth daily with breakfast. (Patient not taking: Reported on 11/09/2019), Disp: 90 tablet, Rfl: 1 .  ONETOUCH VERIO test strip, CHECK FINGERSTICK BLOOD  SUGARS ONCE DAILY (Patient not taking: Reported on 11/09/2019), Disp: 100 strip, Rfl: 3  Current Facility-Administered Medications:  .  betamethasone acetate-betamethasone sodium phosphate (CELESTONE) injection 3 mg, 3 mg, Intramuscular, Once, Edrick Kins, DPM  Patient Active Problem List   Diagnosis Date Noted  . 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 Left foot pain 01/10/2017  . Tinea pedis of both feet 01/10/2017  . Onychomycosis 01/10/2017  . Needs flu shot 01/10/2017  . Onychomycosis of multiple toenails with type 2 diabetes mellitus ([place]) 07/19/2016  . SOB (shortness of breath) 07/14/2016  . Carotid stenosis 03/13/2016  . 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 07/11/2015  . Medication monitoring encounter 05/04/2015  . Ganglion cyst 02/23/2015  . Joint pain in fingers of right hand 02/23/2015  . Hyperlipidemia, unspecified   .  HTN (hypertension)   . Personal history of prostate cancer   . Carotid atherosclerosis   . Fatigue 08/20/2013  . GERD (gastroesophageal reflux disease) 08/20/2013    Past Surgical History:  Procedure Laterality Date  . APPENDECTOMY  1973  . CHOLECYSTECTOMY  2013  . PROSTATECTOMY  2012    Family History  Problem Relation Age of Onset  . Dementia Mother   . High Cholesterol Mother   . Hypertension Mother   . Arthritis Mother   . Hyperlipidemia Mother   . Heart disease Father   . Heart attack Father   . Arthritis Maternal Grandmother   . Cancer Maternal Grandmother         colon  . Prostate cancer Brother   . Cancer Brother        prostate  . Cancer Maternal Uncle        colon  . COPD Maternal Uncle   . Stroke Paternal Grandmother   . Diabetes Paternal Grandmother   . Aneurysm Paternal Grandfather     Social History   Tobacco Use  . Smoking status: Former Smoker    Packs/day: 1.00    Years: 40.00    Pack years: 40.00    Types: Cigarettes    Quit date: 03/09/1997    Years since quitting: 22.6  . Smokeless tobacco: Former Systems developer  . Tobacco comment: smoking cessation materials not required  Vaping Use  . Vaping Use: Never used  Substance Use Topics  . Alcohol use: No    Alcohol/week: 0.0 standard drinks  . Drug use: No     Allergies  Allergen Reactions  . Amoxicillin Swelling  . Metformin And Related Other (See Comments)    Abdominal pains  . Statins Other (See Comments)    Affects the liver  . Welchol [Colesevelam Hcl] Other (See Comments)    affects the liver    Health Maintenance  Topic Date Due  . INFLUENZA VACCINE  10/25/2019  . COLONOSCOPY  01/20/2020 (Originally 10/01/2018)  . FOOT EXAM  01/20/2020  . URINE MICROALBUMIN  01/20/2020  . HEMOGLOBIN A1C  02/10/2020  . OPHTHALMOLOGY EXAM  04/09/2020  . TETANUS/TDAP  03/04/2024  . COVID-19 Vaccine  Completed  . Hepatitis C Screening  Completed  . PNA vac Low Risk Adult  Completed    Chart Review Today: I personally reviewed active problem list, medication list, allergies, family history, social history, health maintenance, notes from last encounter, lab results, imaging with the patient/caregiver today.   Review of Systems  10 Systems reviewed and are negative for acute change except as noted in the HPI.  Objective:   Vitals:   11/09/19 1103  BP: 130/80  Pulse: 92  Resp: 18  Temp: 98.2 F (36.8 C)  TempSrc: Oral  SpO2: 99%  Weight: 177 lb 4.8 oz (80.4 kg)  Height: 5\' 7"  (1.702 m)    Body mass index is 27.77 kg/m.  Physical Exam Vitals and nursing note  reviewed.  Constitutional:      General: He is not in acute distress.    Appearance: Normal appearance. He is well-developed. He is not ill-appearing, toxic-appearing or diaphoretic.     Interventions: Face mask in place.  HENT:     Head: Normocephalic and atraumatic.     Jaw: No trismus.     Right Ear: External ear normal.     Left Ear: External ear normal.  Eyes:     General: Lids are normal. No scleral icterus.    Conjunctiva/sclera:  Conjunctivae normal.     Pupils: Pupils are equal, round, and reactive to light.  Neck:     Trachea: Trachea and phonation normal. No tracheal deviation.  Cardiovascular:     Rate and Rhythm: Normal rate and regular rhythm.     Pulses: Normal pulses.          Radial pulses are 2+ on the right side and 2+ on the left side.       Posterior tibial pulses are 2+ on the right side and 2+ on the left side.     Heart sounds: Normal heart sounds. No murmur heard.  No friction rub. No gallop.   Pulmonary:     Effort: Pulmonary effort is normal. No respiratory distress.     Breath sounds: Normal breath sounds. No stridor. No wheezing, rhonchi or rales.  Abdominal:     General: Bowel sounds are normal. There is no distension.     Palpations: Abdomen is soft.     Tenderness: There is no abdominal tenderness. There is no guarding or rebound.  Musculoskeletal:        General: Normal range of motion.     Cervical back: Normal range of motion and neck supple.     Right lower leg: No edema.     Left lower leg: No edema.  Skin:    General: Skin is warm and dry.     Capillary Refill: Capillary refill takes less than 2 seconds.     Coloration: Skin is not jaundiced.     Findings: No rash.     Nails: There is no clubbing.  Neurological:     Mental Status: He is alert.     Cranial Nerves: No dysarthria or facial asymmetry.     Motor: No tremor or abnormal muscle tone.     Gait: Gait normal.  Psychiatric:        Mood and Affect: Mood normal.        Speech:  Speech normal.        Behavior: Behavior normal. Behavior is cooperative.         Assessment & Plan:   1. Hyperlipidemia, unspecified hyperlipidemia type Patient will be unable to afford Repatha for the rest of the year, encouraged dietary efforts, he will reach out to his cardiologist to see if there is any samples or patient assistance programs available He denies any exertional chest pain or shortness of breath, he is intolerant of statins, has not had any invasive cardiac testing in the past, does see Dr. Rockey Situ and will be following up with him annually - COMPLETE METABOLIC PANEL WITH GFR - Lipid panel  2. Type 2 diabetes mellitus without complication, without long-term current use of insulin (HCC) Has been well controlled with mostly diet efforts, patient unable to tolerate Metformin, he did not try extended release His blood sugars lately have been elevated and he has not been working on diet as strictly as he was previously Encouraged him to try extended release Metformin and let me know if he tolerates this at all Repeat A1c today, may need a sooner follow-up in the next 3 months if we change medications - COMPLETE METABOLIC PANEL WITH GFR - Hemoglobin A1c  3. Essential hypertension Stable, well controlled on amlodipine 5 mg daily - COMPLETE METABOLIC PANEL WITH GFR  4. Gastroesophageal reflux disease, unspecified whether esophagitis present Stable and well-controlled symptoms on Pepcid 20 mg daily  5. Encounter for medication monitoring - CBC with Differential/Platelet - COMPLETE METABOLIC  PANEL WITH GFR - Hemoglobin A1c - Lipid panel   Return in about 3 months (around 02/09/2020) for Routine follow-up DM (med change A1C recheck) .   [name], PA-C 11/09/19 11:20 AM

## 2019-11-09 NOTE — Patient Instructions (Signed)
Try the extended release metformin - see if you tolerate that at all.  We will probably need to come up with a different medication treatment plan for diabetes.

## 2019-11-10 ENCOUNTER — Other Ambulatory Visit: Payer: Self-pay | Admitting: Family Medicine

## 2019-11-10 LAB — LIPID PANEL
Cholesterol: 137 mg/dL (ref <200)
HDL: 48 mg/dL (ref >=40)
LDL Cholesterol (Calc): 66 mg/dL (calc)
Non-HDL Cholesterol (Calc): 89 mg/dL (calc) (ref <130)
Total CHOL/HDL Ratio: 2.9 (calc) (ref <5.0)
Triglycerides: 155 mg/dL — ABNORMAL HIGH (ref <150)

## 2019-11-10 LAB — COMPLETE METABOLIC PANEL WITH GFR
AG Ratio: 1.7 (calc) (ref 1.0–2.5)
ALT: 19 U/L (ref 9–46)
AST: 16 U/L (ref 10–35)
Albumin: 4.3 g/dL (ref 3.6–5.1)
Alkaline phosphatase (APISO): 53 U/L (ref 35–144)
BUN: 19 mg/dL (ref 7–25)
CO2: 26 mmol/L (ref 20–32)
Calcium: 9.2 mg/dL (ref 8.6–10.3)
Chloride: 103 mmol/L (ref 98–110)
Creat: 0.82 mg/dL (ref 0.70–1.18)
GFR, Est African American: 100 mL/min/{1.73_m2} (ref >=60)
GFR, Est Non African American: 87 mL/min/{1.73_m2} (ref >=60)
Globulin: 2.5 g/dL (calc) (ref 1.9–3.7)
Glucose, Bld: 141 mg/dL — ABNORMAL HIGH (ref 65–99)
Potassium: 4.4 mmol/L (ref 3.5–5.3)
Sodium: 138 mmol/L (ref 135–146)
Total Bilirubin: 0.6 mg/dL (ref 0.2–1.2)
Total Protein: 6.8 g/dL (ref 6.1–8.1)

## 2019-11-10 LAB — CBC WITH DIFFERENTIAL/PLATELET
Absolute Monocytes: 669 cells/uL (ref 200–950)
Basophils Absolute: 91 cells/uL (ref 0–200)
Basophils Relative: 1.2 %
Eosinophils Absolute: 91 cells/uL (ref 15–500)
Eosinophils Relative: 1.2 %
HCT: 45.5 % (ref 38.5–50.0)
Hemoglobin: 15.6 g/dL (ref 13.2–17.1)
Lymphs Abs: 2212 cells/uL (ref 850–3900)
MCH: 30.5 pg (ref 27.0–33.0)
MCHC: 34.3 g/dL (ref 32.0–36.0)
MCV: 88.9 fL (ref 80.0–100.0)
MPV: 9.8 fL (ref 7.5–12.5)
Monocytes Relative: 8.8 %
Neutro Abs: 4537 cells/uL (ref 1500–7800)
Neutrophils Relative %: 59.7 %
Platelets: 247 10*3/uL (ref 140–400)
RBC: 5.12 10*6/uL (ref 4.20–5.80)
RDW: 12.3 % (ref 11.0–15.0)
Total Lymphocyte: 29.1 %
WBC: 7.6 10*3/uL (ref 3.8–10.8)

## 2019-11-10 LAB — HEMOGLOBIN A1C
Hgb A1c MFr Bld: 7.6 % of total Hgb — ABNORMAL HIGH (ref <5.7)
Mean Plasma Glucose: 171 (calc)
eAG (mmol/L): 9.5 (calc)

## 2019-11-10 MED ORDER — METFORMIN HCL ER 500 MG PO TB24: 500.0000 mg | ORAL_TABLET | Freq: Every day | ORAL | 2 refills | Status: DC

## 2019-11-11 ENCOUNTER — Ambulatory Visit (INDEPENDENT_AMBULATORY_CARE_PROVIDER_SITE_OTHER): Payer: Medicare Other | Admitting: Pharmacist

## 2019-11-11 ENCOUNTER — Other Ambulatory Visit: Payer: Self-pay

## 2019-11-11 DIAGNOSIS — I10 Essential (primary) hypertension: Secondary | ICD-10-CM | POA: Diagnosis not present

## 2019-11-11 DIAGNOSIS — E785 Hyperlipidemia, unspecified: Secondary | ICD-10-CM | POA: Diagnosis not present

## 2019-11-11 NOTE — Chronic Care Management (AMB) (Signed)
Chronic Care Management Pharmacy  Name: Gerald Bauer  MRN: [id number] DOB: 02-20-1946  Chief Complaint/ HPI  Gerald Bauer,  74 y.o. , male presents for their Initial CCM visit with the clinical pharmacist via telephone due to COVID-19 Pandemic.  PCP : Gerald Grana, PA-C  Their chronic conditions include: HTN, HLD, DM  Office Visits: 8/16 HLD, [place], BP 130/80 P 92 Wt 177 BMI 27.8, can't tolerate metformin, can't afford Repatha, change to metformin XR?  Consult Visit: NA  Medications: Outpatient Encounter Medications as of 11/11/2019  Medication Sig Note  . amLODipine (NORVASC) 5 MG tablet Take 1 tablet (5 mg total) by mouth daily.   Marland Kitchen aspirin 81 MG tablet Take 81 mg by mouth daily.   . cholecalciferol (VITAMIN D) 1000 units tablet Take 1 tablet (1,000 Units total) by mouth daily.   . Cyanocobalamin (VITAMIN B-12 CR PO) Take 1,000 mcg by mouth daily.  03/09/2014: Received from: External Pharmacy  . Evolocumab (REPATHA SURECLICK) 865 MG/ML SOAJ Inject 140 mg into the skin every 14 (fourteen) days.   . famotidine (PEPCID) 20 MG tablet Take 1 tablet (20 mg total) by mouth daily.   . Lancets (ONETOUCH DELICA PLUS [id number]) MISC USE TO CHECK BLOOD GLUCOSE  ONCE DAILY AS DIRECTED (Patient not taking: Reported on 11/09/2019)   . metFORMIN (GLUCOPHAGE-XR) 500 MG 24 hr tablet Take 1 tablet (500 mg total) by mouth daily with breakfast. (Patient not taking: Reported on 11/16/2019)   . Omega-3 Fatty Acids (FISH OIL) 1200 MG CAPS Take 1 capsule by mouth daily.    [name] test strip CHECK FINGERSTICK BLOOD  SUGARS ONCE DAILY (Patient not taking: Reported on 11/09/2019)    Facility-Administered Encounter Medications as of 11/11/2019  Medication  . betamethasone acetate-betamethasone sodium phosphate (CELESTONE) injection 3 mg      Financial Resource Strain: High Risk  . Difficulty of Paying Living Expenses: Hard    Current Diagnosis/Assessment:  Goals Addressed             This Visit's Progress   . Diabetes - goal A1c < 7%       CARE PLAN ENTRY (see longitudinal plan of care for additional care plan information)  Current Barriers:  . Diabetes: type 2; complicated by chronic medical conditions including hyperlipidemia Lab Results  Component Value Date   HGBA1C 7.1 (H) 08/10/2019 .   Lab Results  Component Value Date   CREATININE 0.78 01/20/2019   CREATININE 0.91 01/13/2018   CREATININE 0.90 07/11/2017 .   Marland Kitchen No results found for: EGFR . Current antihyperglycemic regimen: lifestyle . Denies hypoglycemic symptoms, including dizziness, lightheadedness, shaking, sweating . Denies hyperglycemic symptoms, including polyuria, polydipsia, polyphagia, nocturia, blurred vision, neuropathy . Current exercise: active outdoor lifestyle . Current blood glucose readings: 167, 160, 157, 160, 154 . Stopped metformin due to diarrhea  Pharmacist Clinical Goal(s):  Marland Kitchen Over the next 90 days, patient will work with PharmD and primary care provider to address restarting oral diabetes medication . Mild therapeutic effort to move A1c 7.1% to < 7% goal  Interventions: . Comprehensive medication review performed, medication list updated in electronic medical record . Inter-disciplinary care team collaboration (see longitudinal plan of care) . Did not restart metformin due to abdominal pain  Patient Self Care Activities:  . Patient will focus on medication adherence by taking metformin with food . Patient will take medications as prescribed . Patient will contact provider with any episodes of hypoglycemia . Patient will report any questions  or concerns to provider   Initial goal documentation       Hypertension   BP goal is:  <130/80  Office blood pressures are  BP Readings from Last 3 Encounters:  11/09/19 130/80  09/10/19 (!) 142/92  08/10/19 128/64   Patient checks BP at home daily Patient home BP readings are ranging: 133/96, 151/89, 116/79  Patient  has failed these meds in the past: NA Patient is currently controlled on the following medications:  . almodipine $RemoveBefo'5mg'bGoaXnPMhlI$  daily  We discussed: At goal Denies hypotension  Plan  Continue current medications   Diabetes   Recent Relevant Labs: Lab Results  Component Value Date/Time   HGBA1C 7.6 (H) 11/09/2019 11:45 AM   HGBA1C 7.1 (H) 08/10/2019 08:26 AM   HGBA1C 8.8 10/21/2018 12:00 AM   MICROALBUR 20 01/20/2019 09:16 AM   MICROALBUR 0.4 01/10/2017 09:13 AM     Checking BG: Daily  Recent pre-meal BG readings: 145 Patient has failed these meds in past: NA Patient is currently uncontrolled on the following medications: metformin  Last diabetic Foot exam:  Lab Results  Component Value Date/Time   HMDIABEYEEXA No Retinopathy 04/10/2019 12:00 AM    Last diabetic Eye exam: No results found for: HMDIABFOOTEX   We discussed:  Not at goal Some times 10000 steps Restarted metformin? No, stomach pain  Plan  Removed metformin allergy since taking metformin Continue current medications  Hyperlipidemia   LDL goal < 70  Lipid Panel     Component Value Date/Time   CHOL 137 11/09/2019 1145   CHOL 122 08/10/2019 0826   TRIG 155 (H) 11/09/2019 1145   HDL 48 11/09/2019 1145   HDL 48 08/10/2019 0826   LDLCALC 66 11/09/2019 1145    Hepatic Function Latest Ref Rng & Units 11/09/2019 08/10/2019 01/20/2019  Total Protein 6.1 - 8.1 g/dL 6.8 6.9 6.8  Albumin 3.7 - 4.7 g/dL - 4.3 4.2  AST 10 - 35 U/L $Remo'16 18 15  'Bgwth$ ALT 9 - 46 U/L $Remo'19 24 19  'XBpho$ Alk Phosphatase 48 - 121 IU/L - 62 62  Total Bilirubin 0.2 - 1.2 mg/dL 0.6 0.6 0.4  Bilirubin, Direct 0.00 - 0.40 mg/dL - 0.19 -     The 10-year ASCVD risk score Mikey Bussing DC Jr., et al., 2013) is: 44.5%   Values used to calculate the score:     Age: 37 years     Sex: Male     Is Non-Hispanic African American: No     Diabetic: Yes     Tobacco smoker: No     Systolic Blood Pressure: 097 mmHg     Is BP treated: Yes     HDL Cholesterol: 48 mg/dL      Total Cholesterol: 137 mg/dL   Patient has failed these meds in past: NA Patient is currently controlled on the following medications:  . Repatha  We discussed:   Resuming diet  Plan  Continue control with diet and exercise  Medication Management    We discussed:  May have to do [employer] application for [place] PAP pending next lipid panel   Follow up: 3 month phone visit  Milus Height, PharmD, [place], [facility] [phone]

## 2019-11-16 ENCOUNTER — Other Ambulatory Visit: Payer: Self-pay

## 2019-11-16 NOTE — Patient Instructions (Addendum)
Visit Information  Goals Addressed            This Visit's Progress   . Diabetes - goal A1c < 7%       CARE PLAN ENTRY (see longitudinal plan of care for additional care plan information)  Current Barriers:  . Diabetes: type 2; complicated by chronic medical conditions including hyperlipidemia Lab Results  Component Value Date   HGBA1C 7.1 (H) 08/10/2019 .   Lab Results  Component Value Date   CREATININE 0.78 01/20/2019   CREATININE 0.91 01/13/2018   CREATININE 0.90 07/11/2017 .   Marland Kitchen No results found for: EGFR . Current antihyperglycemic regimen: lifestyle . Denies hypoglycemic symptoms, including dizziness, lightheadedness, shaking, sweating . Denies hyperglycemic symptoms, including polyuria, polydipsia, polyphagia, nocturia, blurred vision, neuropathy . Current exercise: active outdoor lifestyle . Current blood glucose readings: 167, 160, 157, 160, 154 . Stopped metformin due to diarrhea  Pharmacist Clinical Goal(s):  Marland Kitchen Over the next 90 days, patient will work with PharmD and primary care provider to address restarting oral diabetes medication . Mild therapeutic effort to move A1c 7.1% to < 7% goal  Interventions: . Comprehensive medication review performed, medication list updated in electronic medical record . Inter-disciplinary care team collaboration (see longitudinal plan of care) . Did not restart metformin due to abdominal pain  Patient Self Care Activities:  . Patient will focus on medication adherence by taking metformin with food . Patient will take medications as prescribed . Patient will contact provider with any episodes of hypoglycemia . Patient will report any questions or concerns to provider   Initial goal documentation        Print copy of patient instructions provided.   Telephone follow up appointment with pharmacy team member scheduled for: 3 months  Milus Height, PharmD, [place], [place] [phone]  Dyslipidemia Dyslipidemia is an imbalance of waxy, fat-like substances (lipids) in the blood. The body needs lipids in small amounts. Dyslipidemia often involves a high level of cholesterol or triglycerides, which are types of lipids. Common forms of dyslipidemia include:  High levels of LDL cholesterol. LDL is the type of cholesterol that causes fatty deposits (plaques) to build up in the blood vessels that carry blood away from your heart (arteries).  Low levels of HDL cholesterol. HDL cholesterol is the type of cholesterol that protects against heart disease. High levels of HDL remove the LDL buildup from arteries.  High levels of triglycerides. Triglycerides are a fatty substance in the blood that is linked to a buildup of plaques in the arteries. What are the causes? Primary dyslipidemia is caused by changes (mutations) in genes that are passed down through families (inherited). These mutations cause several types of dyslipidemia. Secondary dyslipidemia is caused by lifestyle choices and diseases that lead to dyslipidemia, such as:  Eating a diet that is high in animal fat.  Not getting enough exercise.  Having diabetes, kidney disease, liver disease, or thyroid disease.  Drinking large amounts of alcohol.  Using certain medicines. What increases the risk? You are more likely to develop this condition if you are an older man or if you are a woman who has gone through menopause. Other risk factors include:  Having a family history of dyslipidemia.  Taking certain medicines, including birth control pills, steroids, some diuretics, and beta-blockers.  Smoking cigarettes.  Eating a high-fat diet.  Having certain medical conditions such as diabetes, polycystic ovary syndrome (PCOS), kidney disease, liver disease, or hypothyroidism.  Not exercising  regularly.  Being overweight or obese with too much belly fat. What are the signs or symptoms? In most  cases, dyslipidemia does not usually cause any symptoms. In severe cases, very high lipid levels can cause:  Fatty bumps under the skin (xanthomas).  White or gray ring around the black center (pupil) of the eye. Very high triglyceride levels can cause inflammation of the pancreas (pancreatitis). How is this diagnosed? Your health care provider may diagnose dyslipidemia based on a routine blood test (fasting blood test). Because most people do not have symptoms of the condition, this blood testing (lipid profile) is done on adults age 37 and older and is repeated every 5 years. This test checks:  Total cholesterol. This measures the total amount of cholesterol in your blood, including LDL cholesterol, HDL cholesterol, and triglycerides. A healthy number is below 200.  LDL cholesterol. The target number for LDL cholesterol is different for each person, depending on individual risk factors. Ask your health care provider what your LDL cholesterol should be.  HDL cholesterol. An HDL level of 60 or higher is best because it helps to protect against heart disease. A number below 40 for men or below 50 for women increases the risk for heart disease.  Triglycerides. A healthy triglyceride number is below 150. If your lipid profile is abnormal, your health care provider may do other blood tests. How is this treated? Treatment depends on the type of dyslipidemia that you have and your other risk factors for heart disease and stroke. Your health care provider will have a target range for your lipid levels based on this information. For many people, this condition may be treated by lifestyle changes, such as diet and exercise. Your health care provider may recommend that you:  Get regular exercise.  Make changes to your diet.  Quit smoking if you smoke. If diet changes and exercise do not help you reach your goals, your health care provider may also prescribe medicine to lower lipids. The most  commonly prescribed type of medicine lowers your LDL cholesterol (statin drug). If you have a high triglyceride level, your provider may prescribe another type of drug (fibrate) or an omega-3 fish oil supplement, or both. Follow these instructions at home:  Eating and drinking  Follow instructions from your health care provider or dietitian about eating or drinking restrictions.  Eat a healthy diet as told by your health care provider. This can help you reach and maintain a healthy weight, lower your LDL cholesterol, and raise your HDL cholesterol. This may include: ? Limiting your calories, if you are overweight. ? Eating more fruits, vegetables, whole grains, fish, and lean meats. ? Limiting saturated fat, trans fat, and cholesterol.  If you drink alcohol: ? Limit how much you use. ? Be aware of how much alcohol is in your drink. In the [place] one drink equals one 12 oz bottle of beer (355 mL), one 5 oz glass of wine (148 mL), or one 1 oz glass of hard liquor (44 mL).  Do not drink alcohol if: ? Your health care provider tells you not to drink. ? You are pregnant, may be pregnant, or are planning to become pregnant. Activity  Get regular exercise. Start an exercise and strength training program as told by your health care provider. Ask your health care provider what activities are safe for you. Your health care provider may recommend: ? 30 minutes of aerobic activity 4-6 days a week. Brisk walking is an example of aerobic  activity. ? Strength training 2 days a week. General instructions  Do not use any products that contain nicotine or tobacco, such as cigarettes, e-cigarettes, and chewing tobacco. If you need help quitting, ask your health care provider.  Take over-the-counter and prescription medicines only as told by your health care provider. This includes supplements.  Keep all follow-up visits as told by your health care provider. Contact a health care provider if:  You  are: ? Having trouble sticking to your exercise or diet plan. ? Struggling to quit smoking or control your use of alcohol. Summary  Dyslipidemia often involves a high level of cholesterol or triglycerides, which are types of lipids.  Treatment depends on the type of dyslipidemia that you have and your other risk factors for heart disease and stroke.  For many people, treatment starts with lifestyle changes, such as diet and exercise.  Your health care provider may prescribe medicine to lower lipids. This information is not intended to replace advice given to you by your health care provider. Make sure you discuss any questions you have with your health care provider. Document Revised: 11/04/2017 Document Reviewed: 10/11/2017 Elsevier Patient Education  [place].

## 2019-12-03 ENCOUNTER — Encounter: Payer: Self-pay | Admitting: Family Medicine

## 2019-12-30 ENCOUNTER — Other Ambulatory Visit: Payer: Self-pay | Admitting: Family Medicine

## 2020-01-15 ENCOUNTER — Other Ambulatory Visit: Payer: Self-pay | Admitting: Family Medicine

## 2020-02-09 ENCOUNTER — Ambulatory Visit: Payer: Medicare Other | Admitting: Family Medicine

## 2020-02-16 ENCOUNTER — Telehealth: Payer: Self-pay

## 2020-02-16 NOTE — Chronic Care Management (AMB) (Deleted)
Chronic Care Management Pharmacy  Name: Gerald Bauer  MRN: [id number] DOB: 1945-10-05  Chief Complaint/ HPI  Gerald Bauer,  74 y.o. , male presents for their Follow-Up CCM visit with the clinical pharmacist via telephone.  PCP : [name], PA-C  Their chronic conditions include: HTN, HLD, DM  Office Visits: 8/16 HLD, [place], BP 130/80 P 92 Wt 177 BMI 27.8, can't tolerate metformin, can't afford Repatha, change to metformin XR?  Consult Visit: NA  Medications: Outpatient Encounter Medications as of 02/16/2020  Medication Sig Note  . famotidine (PEPCID) 20 MG tablet TAKE 1 TABLET BY MOUTH  TWICE DAILY FOR HEARTBURN   . amLODipine (NORVASC) 5 MG tablet Take 1 tablet (5 mg total) by mouth daily.   Marland Kitchen aspirin 81 MG tablet Take 81 mg by mouth daily.   . cholecalciferol (VITAMIN D) 1000 units tablet Take 1 tablet (1,000 Units total) by mouth daily.   . Cyanocobalamin (VITAMIN B-12 CR PO) Take 1,000 mcg by mouth daily.  03/09/2014: Received from: External Pharmacy  . Evolocumab (REPATHA SURECLICK) 366 MG/ML SOAJ Inject 140 mg into the skin every 14 (fourteen) days.   . Lancets (ONETOUCH DELICA PLUS [id number]) MISC USE TO CHECK BLOOD GLUCOSE  ONCE DAILY AS DIRECTED (Patient not taking: Reported on 11/09/2019)   . metFORMIN (GLUCOPHAGE-XR) 500 MG 24 hr tablet Take 1 tablet (500 mg total) by mouth daily with breakfast. (Patient not taking: Reported on 11/16/2019)   . Omega-3 Fatty Acids (FISH OIL) 1200 MG CAPS Take 1 capsule by mouth daily.    [name] test strip CHECK FINGERSTICK BLOOD  SUGARS ONCE DAILY (Patient not taking: Reported on 11/09/2019)    Facility-Administered Encounter Medications as of 02/16/2020  Medication  . betamethasone acetate-betamethasone sodium phosphate (CELESTONE) injection 3 mg      Financial Resource Strain: High Risk  . Difficulty of Paying Living Expenses: Hard    Current Diagnosis/Assessment:  Goals Addressed   None    Hypertension    BP goal is:  <130/80  Office blood pressures are  BP Readings from Last 3 Encounters:  11/09/19 130/80  09/10/19 (!) 142/92  08/10/19 128/64   Patient checks BP at home daily Patient home BP readings are ranging: 133/96, 151/89, 116/79  Patient has failed these meds in the past: NA Patient is currently controlled on the following medications:  . Amlodipine $RemoveBefo'5mg'npNmlwPKdzl$  daily  We discussed: At goal Denies hypotension  Plan  Continue current medications   Diabetes   Recent Relevant Labs: Lab Results  Component Value Date/Time   HGBA1C 7.6 (H) 11/09/2019 11:45 AM   HGBA1C 7.1 (H) 08/10/2019 08:26 AM   HGBA1C 8.8 10/21/2018 12:00 AM   MICROALBUR 20 01/20/2019 09:16 AM   MICROALBUR 0.4 01/10/2017 09:13 AM     Checking BG: Daily  Recent pre-meal BG readings: 145 Patient has failed these meds in past: NA Patient is currently uncontrolled on the following medications:   Marland Kitchen Metformin XR 500 mg daily   Last diabetic Foot exam:  Lab Results  Component Value Date/Time   HMDIABEYEEXA No Retinopathy 04/10/2019 12:00 AM    Last diabetic Eye exam: No results found for: HMDIABFOOTEX   We discussed:   Plan  Removed metformin allergy since taking metformin Continue current medications  Hyperlipidemia   LDL goal < 70  Lipid Panel     Component Value Date/Time   CHOL 137 11/09/2019 1145   CHOL 122 08/10/2019 0826   TRIG 155 (H) 11/09/2019 1145  HDL 48 11/09/2019 1145   HDL 48 08/10/2019 0826   LDLCALC 66 11/09/2019 1145    Hepatic Function Latest Ref Rng & Units 11/09/2019 08/10/2019 01/20/2019  Total Protein 6.1 - 8.1 g/dL 6.8 6.9 6.8  Albumin 3.7 - 4.7 g/dL - 4.3 4.2  AST 10 - 35 U/L $Remo'16 18 15  'coypH$ ALT 9 - 46 U/L $Remo'19 24 19  'MNowE$ Alk Phosphatase 48 - 121 IU/L - 62 62  Total Bilirubin 0.2 - 1.2 mg/dL 0.6 0.6 0.4  Bilirubin, Direct 0.00 - 0.40 mg/dL - 0.19 -     The 10-year ASCVD risk score Mikey Bussing DC Jr., et al., 2013) is: 44.5%   Values used to calculate the score:     Age:  49 years     Sex: Male     Is Non-Hispanic African American: No     Diabetic: Yes     Tobacco smoker: No     Systolic Blood Pressure: 681 mmHg     Is BP treated: Yes     HDL Cholesterol: 48 mg/dL     Total Cholesterol: 137 mg/dL   Patient has failed these meds in past: Zetia . Patient is currently controlled on the following medications:  . Repatha 140 mg every 14 days   We discussed:   Resuming diet  Plan  Continue control with diet and exercise  Medication Management    We discussed:    Plan  Repatha PAP pending next lipid panel   Follow up: 3 month phone visit  Milus Height, PharmD, [place], [facility] [phone]

## 2020-03-24 ENCOUNTER — Ambulatory Visit: Payer: Medicare Other

## 2020-03-24 DIAGNOSIS — E785 Hyperlipidemia, unspecified: Secondary | ICD-10-CM

## 2020-03-24 DIAGNOSIS — E1159 Type 2 diabetes mellitus with other circulatory complications: Secondary | ICD-10-CM

## 2020-03-24 DIAGNOSIS — E119 Type 2 diabetes mellitus without complications: Secondary | ICD-10-CM

## 2020-03-24 NOTE — Patient Instructions (Signed)
Visit Information It was great speaking with you today!  Please let me know if you have any questions about our visit. Goals Addressed            This Visit's Progress   . Chronic Care Management       CARE PLAN ENTRY (see longitudinal plan of care for additional care plan information)  Current Barriers:  . Chronic Disease Management support, education, and care coordination needs related to Hypertension, Hyperlipidemia, Diabetes, and GERD   Hypertension BP Readings from Last 3 Encounters:  11/09/19 130/80  09/10/19 (!) 142/92  08/10/19 128/64   . Pharmacist Clinical Goal(s): o Over the next 90 days, patient will work with PharmD and providers to maintain BP goal <140/90 . Current regimen:  o Amlodipine 5 mg daily  . Interventions: o Discussed low salt diet and exercising as tolerated extensively o Will initiate blood pressure monitoring plan  . Patient self care activities - Over the next 90 days, patient will: o Check Blood Pressure weekly, document, and provide at future appointments o Ensure daily salt intake < 2300 mg/day  Hyperlipidemia Lab Results  Component Value Date/Time   LDLCALC 66 11/09/2019 11:45 AM   . Pharmacist Clinical Goal(s): o Over the next 90 days, patient will work with PharmD and providers to maintain LDL goal < 70 . Current regimen:  o Repatha 140 mg into skin every 14 days  . Interventions: o Discussed low cholesterol diet and exercising as tolerated extensively o Will initiate cholesterol monitoring plan   Diabetes Lab Results  Component Value Date/Time   HGBA1C 7.6 (H) 11/09/2019 11:45 AM   HGBA1C 7.1 (H) 08/10/2019 08:26 AM   HGBA1C 8.8 10/21/2018 12:00 AM   . Pharmacist Clinical Goal(s): o Over the next 90 days, patient will work with PharmD and providers to achieve A1c goal <7% . Current regimen:  o None . Interventions: o Discussed carbohydrate counting and exercising as tolerated extensively o Will initiate blood sugar  monitoring plan  . Patient self care activities - Over the next 90 days, patient will: o Check blood sugar once daily, document, and provide at future appointments o Contact provider with any episodes of hypoglycemia  Medication management . Pharmacist Clinical Goal(s): o Over the next 90 days, patient will work with PharmD and providers to maintain optimal medication adherence . Current pharmacy: [facility] . Interventions o Comprehensive medication review performed. o Continue current medication management strategy . Patient self care activities - Over the next 90 days, patient will: o Take medications as prescribed o Report any questions or concerns to PharmD and/or provider(s)    . COMPLETED: Diabetes - goal A1c < 7%       CARE PLAN ENTRY (see longitudinal plan of care for additional care plan information)  Current Barriers:  . Diabetes: type 2; complicated by chronic medical conditions including hyperlipidemia Lab Results  Component Value Date   HGBA1C 7.1 (H) 08/10/2019 .   Lab Results  Component Value Date   CREATININE 0.78 01/20/2019   CREATININE 0.91 01/13/2018   CREATININE 0.90 07/11/2017 .   Marland Kitchen No results found for: EGFR . Current antihyperglycemic regimen: lifestyle . Denies hypoglycemic symptoms, including dizziness, lightheadedness, shaking, sweating . Denies hyperglycemic symptoms, including polyuria, polydipsia, polyphagia, nocturia, blurred vision, neuropathy . Current exercise: active outdoor lifestyle . Current blood glucose readings: 167, 160, 157, 160, 154 . Stopped metformin due to diarrhea  Pharmacist Clinical Goal(s):  Marland Kitchen Over the next 90 days, patient will work with PharmD  and primary care provider to address restarting oral diabetes medication . Mild therapeutic effort to move A1c 7.1% to < 7% goal  Interventions: . Comprehensive medication review performed, medication list updated in electronic medical record . Inter-disciplinary care team  collaboration (see longitudinal plan of care) . Did not restart metformin due to abdominal pain  Patient Self Care Activities:  . Patient will focus on medication adherence by taking metformin with food . Patient will take medications as prescribed . Patient will contact provider with any episodes of hypoglycemia . Patient will report any questions or concerns to provider   Initial goal documentation     . COMPLETED: Hyperlipidemia - goal LDL < 70       CARE PLAN ENTRY (see longitudinal plan of care for additional care plan information)  Current Barriers:  . Controlled hyperlipidemia, complicated by diabetes, statin intolerance . Current antihyperlipidemic regimen: Repatha . Previous antihyperlipidemic medications tried statin medication . Most recent lipid panel:     Component Value Date/Time   CHOL 122 08/10/2019 0826   TRIG 105 08/10/2019 0826   HDL 48 08/10/2019 0826   CHOLHDL 2.5 08/10/2019 0826   CHOLHDL 5.2 (H) 01/10/2017 0913   VLDL 27 07/11/2016 0931   LDLCALC 55 08/10/2019 0826   LDLCALC 165 (H) 01/10/2017 0913 .    Pharmacist Clinical Goal(s):  Marland Kitchen Over the next 90 days, patient will work with PharmD and providers towards optimized antihyperlipidemic therapy  Interventions: . Comprehensive medication review performed; medication list updated in electronic medical record.  [name] care team collaboration (see longitudinal plan of care) . Draw lipid panel to confirm Repatha at goal  Patient Self Care Activities:  . Patient will focus on medication adherence by continuing Repatha injections even if costly  Initial goal documentation     . Patient Stated   On track    Walk 10,000 steps daily       The patient verbalized understanding of instructions, educational materials, and care plan provided today and declined offer to receive copy of patient instructions, educational materials, and care plan.   Telephone follow up appointment with pharmacy  team member scheduled for: 05/05/20 at 9:00 AM  [facility] [phone]

## 2020-03-24 NOTE — Chronic Care Management (AMB) (Cosign Needed)
Chronic Care Management Pharmacy  Name: Gerald Bauer  MRN: [id number] DOB: [birth year]  Chief Complaint/ HPI  [name],  74 y.o. , male presents for their Follow-Up CCM visit with the clinical pharmacist via telephone.  PCP : [name], PA-C  Their chronic conditions include: Hypertension, Hyperlipidemia, Diabetes, and GERD  Office Visits: 8/16 HLD, [place], BP 130/80 P 92 Wt 177 BMI 27.8, can't tolerate metformin, can't afford Repatha, change to metformin XR?  Consult Visit: NA  Medications: Outpatient Encounter Medications as of 03/24/2020  Medication Sig Note  . famotidine (PEPCID) 20 MG tablet TAKE 1 TABLET BY MOUTH  TWICE DAILY FOR HEARTBURN   . amLODipine (NORVASC) 5 MG tablet Take 1 tablet (5 mg total) by mouth daily.   Marland Kitchen aspirin 81 MG tablet Take 81 mg by mouth daily.   . cholecalciferol (VITAMIN D) 1000 units tablet Take 1 tablet (1,000 Units total) by mouth daily.   . Cyanocobalamin (VITAMIN B-12 CR PO) Take 1,000 mcg by mouth daily.  03/09/2014: Received from: External Pharmacy  . Evolocumab (REPATHA SURECLICK) 409 MG/ML SOAJ Inject 140 mg into the skin every 14 (fourteen) days.   . Lancets (ONETOUCH DELICA PLUS [id number]) MISC USE TO CHECK BLOOD GLUCOSE  ONCE DAILY AS DIRECTED (Patient not taking: Reported on 11/09/2019)   . Omega-3 Fatty Acids (FISH OIL) 1200 MG CAPS Take 1 capsule by mouth daily.    [name] test strip CHECK FINGERSTICK BLOOD  SUGARS ONCE DAILY (Patient not taking: Reported on 11/09/2019)   . [DISCONTINUED] metFORMIN (GLUCOPHAGE-XR) 500 MG 24 hr tablet Take 1 tablet (500 mg total) by mouth daily with breakfast. (Patient not taking: No sig reported)    Facility-Administered Encounter Medications as of 03/24/2020  Medication  . betamethasone acetate-betamethasone sodium phosphate (CELESTONE) injection 3 mg   Current Diagnosis/Assessment:  SDOH Interventions   Flowsheet Row Most Recent Value  SDOH Interventions   Financial Strain  Interventions Intervention Not Indicated  Transportation Interventions Intervention Not Indicated      Goals Addressed            This Visit's Progress   . Chronic Care Management       CARE PLAN ENTRY (see longitudinal plan of care for additional care plan information)  Current Barriers:  . Chronic Disease Management support, education, and care coordination needs related to Hypertension, Hyperlipidemia, Diabetes, and GERD   Hypertension BP Readings from Last 3 Encounters:  11/09/19 130/80  09/10/19 (!) 142/92  08/10/19 128/64   . Pharmacist Clinical Goal(s): o Over the next 90 days, patient will work with PharmD and providers to maintain BP goal <140/90 . Current regimen:  o Amlodipine 5 mg daily  . Interventions: o Discussed low salt diet and exercising as tolerated extensively o Will initiate blood pressure monitoring plan  . Patient self care activities - Over the next 90 days, patient will: o Check Blood Pressure weekly, document, and provide at future appointments o Ensure daily salt intake < 2300 mg/day  Hyperlipidemia Lab Results  Component Value Date/Time   LDLCALC 66 11/09/2019 11:45 AM   . Pharmacist Clinical Goal(s): o Over the next 90 days, patient will work with PharmD and providers to maintain LDL goal < 70 . Current regimen:  o Repatha 140 mg into skin every 14 days  . Interventions: o Discussed low cholesterol diet and exercising as tolerated extensively o Will initiate cholesterol monitoring plan   Diabetes Lab Results  Component Value Date/Time   HGBA1C 7.6 (  H) 11/09/2019 11:45 AM   HGBA1C 7.1 (H) 08/10/2019 08:26 AM   HGBA1C 8.8 10/21/2018 12:00 AM   . Pharmacist Clinical Goal(s): o Over the next 90 days, patient will work with PharmD and providers to achieve A1c goal <7% . Current regimen:  o None . Interventions: o Discussed carbohydrate counting and exercising as tolerated extensively o Will initiate blood sugar monitoring plan   . Patient self care activities - Over the next 90 days, patient will: o Check blood sugar once daily, document, and provide at future appointments o Contact provider with any episodes of hypoglycemia  Medication management . Pharmacist Clinical Goal(s): o Over the next 90 days, patient will work with PharmD and providers to maintain optimal medication adherence . Current pharmacy: [facility] . Interventions o Comprehensive medication review performed. o Continue current medication management strategy . Patient self care activities - Over the next 90 days, patient will: o Take medications as prescribed o Report any questions or concerns to PharmD and/or provider(s)    . COMPLETED: Diabetes - goal A1c < 7%       CARE PLAN ENTRY (see longitudinal plan of care for additional care plan information)  Current Barriers:  . Diabetes: type 2; complicated by chronic medical conditions including hyperlipidemia Lab Results  Component Value Date   HGBA1C 7.1 (H) 08/10/2019 .   Lab Results  Component Value Date   CREATININE 0.78 01/20/2019   CREATININE 0.91 01/13/2018   CREATININE 0.90 07/11/2017 .   Marland Kitchen No results found for: EGFR . Current antihyperglycemic regimen: lifestyle . Denies hypoglycemic symptoms, including dizziness, lightheadedness, shaking, sweating . Denies hyperglycemic symptoms, including polyuria, polydipsia, polyphagia, nocturia, blurred vision, neuropathy . Current exercise: active outdoor lifestyle . Current blood glucose readings: 167, 160, 157, 160, 154 . Stopped metformin due to diarrhea  Pharmacist Clinical Goal(s):  Marland Kitchen Over the next 90 days, patient will work with PharmD and primary care provider to address restarting oral diabetes medication . Mild therapeutic effort to move A1c 7.1% to < 7% goal  Interventions: . Comprehensive medication review performed, medication list updated in electronic medical record . Inter-disciplinary care team collaboration (see  longitudinal plan of care) . Did not restart metformin due to abdominal pain  Patient Self Care Activities:  . Patient will focus on medication adherence by taking metformin with food . Patient will take medications as prescribed . Patient will contact provider with any episodes of hypoglycemia . Patient will report any questions or concerns to provider   Initial goal documentation     . COMPLETED: Hyperlipidemia - goal LDL < 70       CARE PLAN ENTRY (see longitudinal plan of care for additional care plan information)  Current Barriers:  . Controlled hyperlipidemia, complicated by diabetes, statin intolerance . Current antihyperlipidemic regimen: Repatha . Previous antihyperlipidemic medications tried statin medication . Most recent lipid panel:     Component Value Date/Time   CHOL 122 08/10/2019 0826   TRIG 105 08/10/2019 0826   HDL 48 08/10/2019 0826   CHOLHDL 2.5 08/10/2019 0826   CHOLHDL 5.2 (H) 01/10/2017 0913   VLDL 27 07/11/2016 0931   LDLCALC 55 08/10/2019 0826   LDLCALC 165 (H) 01/10/2017 0913 .    Pharmacist Clinical Goal(s):  Marland Kitchen Over the next 90 days, patient will work with PharmD and providers towards optimized antihyperlipidemic therapy  Interventions: . Comprehensive medication review performed; medication list updated in electronic medical record.  [name] care team collaboration (see longitudinal plan of care) .  Draw lipid panel to confirm Repatha at goal  Patient Self Care Activities:  . Patient will focus on medication adherence by continuing Repatha injections even if costly  Initial goal documentation     . Patient Stated   On track    Walk 10,000 steps daily      Hypertension   BP goal is:  <130/80  Office blood pressures are  BP Readings from Last 3 Encounters:  11/09/19 130/80  09/10/19 (!) 142/92  08/10/19 128/64   Patient checks BP at home daily Patient home BP readings are ranging:   Patient has failed these meds in  the past: NA Patient is currently controlled on the following medications:  . Amlodipine $RemoveBefo'5mg'sILkHbehcWN$  daily  We discussed: At goal Denies hypotension  Plan  Continue current medications   Diabetes   Recent Relevant Labs: Lab Results  Component Value Date/Time   HGBA1C 7.6 (H) 11/09/2019 11:45 AM   HGBA1C 7.1 (H) 08/10/2019 08:26 AM   HGBA1C 8.8 10/21/2018 12:00 AM   MICROALBUR 20 01/20/2019 09:16 AM   MICROALBUR 0.4 01/10/2017 09:13 AM     Checking BG: 2-3 times weekly Recent pre-meal BG readings: Patient states range is 125-154, but recent readings per meter have been higher:   Fasting  Pre-Dinner Post-Dinner  27-Dec 155    25-Dec 156    24-Dec 194    22-Dec  178   13-Dec   201  Average 168     Patient has failed these meds in past: Metformin IR (Stomach pain), Metformin XR ( Knee Pain)  Patient is currently uncontrolled on the following medications:  Marland Kitchen Metformin XR 500 mg daily (not taking)   Last diabetic Foot exam:  Lab Results  Component Value Date/Time   HMDIABEYEEXA No Retinopathy 04/10/2019 12:00 AM    Last diabetic Eye exam: No results found for: HMDIABFOOTEX   We discussed: Patient stopped his metformin in August due to significant knee pain which went away when he stopped taking the metformin. He attributes the knee pain to metformin and is not interested in restarting it at this time.   Patient feels his major problem is lifestyle related. He states he enjoys bread too frequently and thinks his chocolate milk is playing a role in sugars as well. He drinks chocolate milk 1-2 times weekly, will have one diet soda daily, and has black coffee every morning.   He has a fitbit and has a goal of 10,000 steps daily. Recently he has been achieving closer to 5000 steps daily. He also has 10 lb. Dumbbells he uses sporadically.   Discussed with patient goal setting for dietary and physical modifications   Plan  Recommend discontinuing metformin due to patient perceived  adverse event  CPA Assessment in 3weeks to assess success of lifestyle changes.  CPP Visit in 6 weeks  Recommend A1c in ~5 weeks Recommend Microalbumin / creatinine urine ratioin ~5 weeks   Hyperlipidemia   LDL goal < 70  Lipid Panel     Component Value Date/Time   CHOL 137 11/09/2019 1145   CHOL 122 08/10/2019 0826   TRIG 155 (H) 11/09/2019 1145   HDL 48 11/09/2019 1145   HDL 48 08/10/2019 0826   LDLCALC 66 11/09/2019 1145    Hepatic Function Latest Ref Rng & Units 11/09/2019 08/10/2019 01/20/2019  Total Protein 6.1 - 8.1 g/dL 6.8 6.9 6.8  Albumin 3.7 - 4.7 g/dL - 4.3 4.2  AST 10 - 35 U/L $Remo'16 18 15  'gOHEY$ ALT 9 -  46 U/L $Remo'19 24 19  'kDfez$ Alk Phosphatase 48 - 121 IU/L - 62 62  Total Bilirubin 0.2 - 1.2 mg/dL 0.6 0.6 0.4  Bilirubin, Direct 0.00 - 0.40 mg/dL - 0.19 -     The 10-year ASCVD risk score Mikey Bussing DC Jr., et al., 2013) is: 44.5%   Values used to calculate the score:     Age: 66 years     Sex: Male     Is Non-Hispanic African American: No     Diabetic: Yes     Tobacco smoker: No     Systolic Blood Pressure: 557 mmHg     Is BP treated: Yes     HDL Cholesterol: 48 mg/dL     Total Cholesterol: 137 mg/dL   Patient has failed these meds in past: NA Patient is currently controlled on the following medications:  . Aspirin 81 mg daily . Fish Oil 1200 mg daily  . Repatha 140 mg every 14 days   We discussed:   Resuming diet  Plan  Continue control with diet and exercise  GERD   Patient denies dysphagia, heartburn or nausea. Expresses understanding to avoid triggers such as citrus juices, lying down after eating and tomato sauce.  Currently controlled on: Marland Kitchen Famotidine 20 mg twice daily   Plan   Continue current medication.  Misc / OTC   . Vitamin D 1000 units daily  . Vitamin B12 1000 mcg daily   Plan  Continue current medications  Vaccines   Reviewed and discussed patient's vaccination history.    Immunization History  Administered Date(s) Administered  .  Fluad Quad(high Dose 65+) 01/20/2019  . Influenza, High Dose Seasonal PF 01/10/2017, 01/13/2018  . Influenza-Unspecified 01/10/2012, 01/01/2013, 01/01/2015  . Janssen (J&J) SARS-COV-2 Vaccination 09/08/2019  . Pneumococcal Conjugate-13 07/30/2013  . Pneumococcal Polysaccharide-23 02/27/2011  . Td 03/04/2014  . Zoster 02/17/2014  . Zoster Recombinat (Shingrix) 07/01/2016, 01/31/2017   Medication Management   Patient's preferred pharmacy is:  [facility], [address], [address] [place], [place] [address] Phone: [phone] Fax: [facility], [address] [place] [address] Phone: [phone] Fax: [phone]  Uses pill box? Yes Pt endorses 95% compliance  We discussed: Current pharmacy is preferred with insurance plan and patient is satisfied with pharmacy services  Plan  Continue current medication management strategy  Follow up: 6 week phone visit  [facility] [phone]

## 2020-03-28 ENCOUNTER — Telehealth: Payer: Self-pay

## 2020-03-28 NOTE — Telephone Encounter (Signed)
Tried calling pt, no answer no option for VM

## 2020-03-28 NOTE — Telephone Encounter (Signed)
-----   Message from Danelle Berry, [place] sent at 03/24/2020  9:37 PM EST ----- Regarding: RE: He was supposed to do a 3 month f/up  Front office please schedule him a regular OV ----- Message ----- From: Gaspar Cola, [facility] Sent: 03/24/2020   9:19 AM EST To: Danelle Berry, Jimmy Footman,  Patient stopped his metformin due to intolerable side effects (knee pain strangely enough). I am worried his DM control is worsening. I am meeting again with him in February, would you be ok with putting in a lab order for an A1c and a microalbumin/creatinine ratio? It's been 4 months since his last BMP if you wanted to check that as well at that time. That would be very helpful so I can have updated labwork to discuss with him at my next visit.  Thanks! Garey Ham Clinical Pharmacist [facility] [phone]

## 2020-04-06 ENCOUNTER — Telehealth: Payer: Self-pay | Admitting: *Deleted

## 2020-04-06 NOTE — Telephone Encounter (Signed)
Pt has been previously been approved from 03/26/2020-03/25/2021.

## 2020-04-07 NOTE — Progress Notes (Signed)
Pt overdue for OV f/up for uncontrolled DM  Please call to have him schedule in the next 2 months Pharmacy CCM consults do not replace OV with providers or management of his Dx/meds etc. Please call pt to have him get appt - currently not scheduled and overdue for f/up, due for labs

## 2020-04-25 ENCOUNTER — Other Ambulatory Visit: Payer: Self-pay

## 2020-04-25 ENCOUNTER — Encounter: Payer: Self-pay | Admitting: Family Medicine

## 2020-04-25 ENCOUNTER — Ambulatory Visit (INDEPENDENT_AMBULATORY_CARE_PROVIDER_SITE_OTHER): Payer: Medicare Other | Admitting: Family Medicine

## 2020-04-25 VITALS — BP 134/86 | HR 120 | Temp 98.0°F | Resp 16 | Ht 67.0 in | Wt 170.0 lb

## 2020-04-25 DIAGNOSIS — E1159 Type 2 diabetes mellitus with other circulatory complications: Secondary | ICD-10-CM

## 2020-04-25 DIAGNOSIS — E785 Hyperlipidemia, unspecified: Secondary | ICD-10-CM

## 2020-04-25 DIAGNOSIS — H9313 Tinnitus, bilateral: Secondary | ICD-10-CM | POA: Diagnosis not present

## 2020-04-25 DIAGNOSIS — K219 Gastro-esophageal reflux disease without esophagitis: Secondary | ICD-10-CM | POA: Diagnosis not present

## 2020-04-25 DIAGNOSIS — I152 Hypertension secondary to endocrine disorders: Secondary | ICD-10-CM

## 2020-04-25 DIAGNOSIS — E1169 Type 2 diabetes mellitus with other specified complication: Secondary | ICD-10-CM | POA: Diagnosis not present

## 2020-04-25 DIAGNOSIS — R000 Tachycardia, unspecified: Secondary | ICD-10-CM | POA: Diagnosis not present

## 2020-04-25 DIAGNOSIS — E119 Type 2 diabetes mellitus without complications: Secondary | ICD-10-CM

## 2020-04-25 DIAGNOSIS — H9193 Unspecified hearing loss, bilateral: Secondary | ICD-10-CM

## 2020-04-25 DIAGNOSIS — Z23 Encounter for immunization: Secondary | ICD-10-CM

## 2020-04-25 DIAGNOSIS — Z5181 Encounter for therapeutic drug level monitoring: Secondary | ICD-10-CM

## 2020-04-25 DIAGNOSIS — G720 Drug-induced myopathy: Secondary | ICD-10-CM

## 2020-04-25 DIAGNOSIS — H6121 Impacted cerumen, right ear: Secondary | ICD-10-CM

## 2020-04-25 DIAGNOSIS — I700 Atherosclerosis of aorta: Secondary | ICD-10-CM

## 2020-04-25 DIAGNOSIS — T466X5A Adverse effect of antihyperlipidemic and antiarteriosclerotic drugs, initial encounter: Secondary | ICD-10-CM

## 2020-04-25 MED ORDER — METFORMIN HCL ER 500 MG PO TB24: 500.0000 mg | ORAL_TABLET | Freq: Every day | ORAL | 1 refills | Status: DC

## 2020-04-25 NOTE — Progress Notes (Signed)
Name: Gerald Bauer   MRN: [id number]    DOB: 24-Feb-1946   Date:04/25/2020       Progress Note  Chief Complaint  Patient presents with  . Follow-up  . Diabetes  . Hypertension    Stopped amlodopine due to GERD  . Gastroesophageal Reflux     Subjective:   Gerald Bauer is a 75 y.o. male, presents to clinic for routine f/up  DM:  Uncontrolled - pt not taking meds  Pt managing DM - not on any meds - stopped metformin due to knee pain, did not tolerate regular metformin, XR metformin caused knee pain - he is willing to try it again - has discussed this as well with clinical embedded pharmacists and was going to try meds again on 500 mg XR per day - but has not yet, has meds at home Blood sugars -not checking Denies: Polyuria, polydipsia, vision changes, neuropathy, hypoglycemia Recent pertinent labs: Lab Results  Component Value Date   HGBA1C 7.6 (H) 11/09/2019   HGBA1C 7.1 (H) 08/10/2019   HGBA1C 6.8 (A) 01/20/2019   Standard of care and health maintenance: Urine Microalbumin:  due Foot exam:  due DM eye exam:  due ACEI/ARB:  Not taking Statin:  Statin myalgia/intolerance  Hypertension:  Currently managed with norvasc 5 mg but pt stopped this due to acid reflux sx?  He states that in the last 2 weeks off norvasc he has had only 1 d of acid reflux  - so not currently on HTN meds Blood pressure today is fairly well controlled. BP Readings from Last 3 Encounters:  04/25/20 134/86  11/09/19 130/80  09/10/19 (!) 142/92   Pt denies CP, SOB, exertional sx, LE edema, palpitation, Ha's, visual disturbances, lightheadedness, hypotension, syncope. Dietary efforts for BP?  none  He complains of tinnitus and hearing loss - he did have hearing checked a few years ago by the [place]  Hyperlipidemia: Currently treated with repatha - not taking since he went in the donut hole end of last year - with new year that should not be an issue and there are notes in chart within the past  couple weeks from specialist about repatha being approved If insurance covers he's willing to take again On omega 3 supplements Last Lipids: Lab Results  Component Value Date   CHOL 137 11/09/2019   HDL 48 11/09/2019   LDLCALC 66 11/09/2019   TRIG 155 (H) 11/09/2019   CHOLHDL 2.9 11/09/2019   - Denies: Chest pain, shortness of breath, myalgias, claudication  GERD:  Not currently taking pepcid - sx better over the past 2 weeks.   Current Outpatient Medications:  .  aspirin 81 MG tablet, Take 81 mg by mouth daily., Disp: , Rfl:  .  cholecalciferol (VITAMIN D) 1000 units tablet, Take 1 tablet (1,000 Units total) by mouth daily., Disp: 90 tablet, Rfl: 3 .  Cyanocobalamin (VITAMIN B-12 CR PO), Take 1,000 mcg by mouth daily. , Disp: , Rfl:  .  famotidine (PEPCID) 20 MG tablet, TAKE 1 TABLET BY MOUTH  TWICE DAILY FOR HEARTBURN, Disp: 180 tablet, Rfl: 3 .  Lancets (ONETOUCH DELICA PLUS [id number]) MISC, USE TO CHECK BLOOD GLUCOSE  ONCE DAILY AS DIRECTED, Disp: 100 each, Rfl: 3 .  Omega-3 Fatty Acids (FISH OIL) 1200 MG CAPS, Take 1 capsule by mouth daily. , Disp: , Rfl:  .  ONETOUCH VERIO test strip, CHECK FINGERSTICK BLOOD  SUGARS ONCE DAILY, Disp: 100 strip, Rfl: 3 .  amLODipine (NORVASC) 5  MG tablet, Take 1 tablet (5 mg total) by mouth daily. (Patient not taking: Reported on 04/25/2020), Disp: 90 tablet, Rfl: 3 .  Evolocumab (REPATHA SURECLICK) 140 MG/ML SOAJ, Inject 140 mg into the skin every 14 (fourteen) days. (Patient not taking: Reported on 04/25/2020), Disp: 2 pen, Rfl: 11  Current Facility-Administered Medications:  .  betamethasone acetate-betamethasone sodium phosphate (CELESTONE) injection 3 mg, 3 mg, Intramuscular, Once, Felecia Shelling, DPM  Patient Active Problem List   Diagnosis Date Noted  . 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 Left foot pain 01/10/2017  . Tinea pedis of both feet 01/10/2017  . Onychomycosis 01/10/2017  . Needs flu shot 01/10/2017  . Onychomycosis of multiple  toenails with type 2 diabetes mellitus (HCC) 07/19/2016  . SOB (shortness of breath) 07/14/2016  . Carotid stenosis 03/13/2016  . 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 07/11/2015  . Medication monitoring encounter 05/04/2015  . Ganglion cyst 02/23/2015  . Joint pain in fingers of right hand 02/23/2015  . Hyperlipidemia, unspecified   . HTN (hypertension)   . Personal history of prostate cancer   . Carotid atherosclerosis   . Fatigue 08/20/2013  . GERD (gastroesophageal reflux disease) 08/20/2013    Past Surgical History:  Procedure Laterality Date  . APPENDECTOMY  1973  . CHOLECYSTECTOMY  2013  . PROSTATECTOMY  2012    Family History  Problem Relation Age of Onset  . Dementia Mother   . High Cholesterol Mother   . Hypertension Mother   . Arthritis Mother   . Hyperlipidemia Mother   . Heart disease Father   . Heart attack Father   . Arthritis Maternal Grandmother   . Cancer Maternal Grandmother        colon  . Prostate cancer Brother   . Cancer Brother        prostate  . Cancer Maternal Uncle        colon  . COPD Maternal Uncle   . Stroke Paternal Grandmother   . Diabetes Paternal Grandmother   . Aneurysm Paternal Grandfather     Social History   Tobacco Use  . Smoking status: Former Smoker    Packs/day: 1.00    Years: 40.00    Pack years: 40.00    Types: Cigarettes    Quit date: 03/09/1997    Years since quitting: 23.1  . Smokeless tobacco: Former Neurosurgeon  . Tobacco comment: smoking cessation materials not required  Vaping Use  . Vaping Use: Never used  Substance Use Topics  . Alcohol use: No    Alcohol/week: 0.0 standard drinks  . Drug use: No     Allergies  Allergen Reactions  . Amoxicillin Swelling  . Metformin And Related Other (See Comments)    Abdominal pains  . Statins Other (See Comments)    Affects the liver  . Welchol [Colesevelam Hcl] Other (See Comments)    affects the liver    Health Maintenance  Topic Date Due  .  COVID-19 Vaccine (2 - Booster for [employer] series) 11/03/2019  . FOOT EXAM  01/20/2020  . URINE MICROALBUMIN  01/20/2020  . OPHTHALMOLOGY EXAM  04/09/2020  . COLONOSCOPY (Pts 45-44yrs Insurance coverage will need to be confirmed)  04/25/2021 (Originally 10/01/2018)  . HEMOGLOBIN A1C  05/11/2020  . TETANUS/TDAP  03/04/2024  . INFLUENZA VACCINE  Completed  . Hepatitis C Screening  Completed  . PNA vac Low Risk Adult  Completed    Chart Review Today: I personally reviewed active problem list, medication list,  allergies, family history, social history, health maintenance, notes from last encounter, lab results, imaging with the patient/caregiver today.   Review of Systems  Constitutional: Negative.   HENT: Negative.   Eyes: Negative.   Respiratory: Negative.   Cardiovascular: Negative.   Gastrointestinal: Negative.   Endocrine: Negative.   Genitourinary: Negative.   Musculoskeletal: Negative.   Skin: Negative.   Allergic/Immunologic: Negative.   Neurological: Negative.   Hematological: Negative.   Psychiatric/Behavioral: Negative.   All other systems reviewed and are negative.    Objective:   Vitals:   04/25/20 1450 04/25/20 1515  BP: 134/86   Pulse: (!) 116 (!) 120  Resp: 16   Temp: 98 F (36.7 C)   SpO2: 98%   Weight: 170 lb (77.1 kg)   Height: 5\' 7"  (1.702 m)     Body mass index is 26.63 kg/m.  Physical Exam Vitals and nursing note reviewed.  Constitutional:      General: He is not in acute distress.    Appearance: Normal appearance. He is well-developed and normal weight. He is not ill-appearing, toxic-appearing or diaphoretic.     Interventions: Face mask in place.  HENT:     Head: Normocephalic and atraumatic.     Jaw: No trismus.     Right Ear: Ear canal and external ear normal. There is impacted cerumen.     Left Ear: Tympanic membrane, ear canal and external ear normal. There is no impacted cerumen.     Nose: Nose normal. No congestion.      Mouth/Throat:     Mouth: Mucous membranes are moist.     Pharynx: Oropharynx is clear. No oropharyngeal exudate or posterior oropharyngeal erythema.  Eyes:     General: Lids are normal. No scleral icterus.       Right eye: No discharge.        Left eye: No discharge.     Conjunctiva/sclera: Conjunctivae normal.     Pupils: Pupils are equal, round, and reactive to light.  Neck:     Trachea: Trachea and phonation normal. No tracheal deviation.  Cardiovascular:     Rate and Rhythm: Regular rhythm. Tachycardia present.     Pulses: Normal pulses.          Radial pulses are 2+ on the right side and 2+ on the left side.       Posterior tibial pulses are 2+ on the right side and 2+ on the left side.     Heart sounds: Normal heart sounds. No murmur heard. No friction rub. No gallop.   Pulmonary:     Effort: Pulmonary effort is normal. No respiratory distress.     Breath sounds: Normal breath sounds. No stridor. No wheezing, rhonchi or rales.  Abdominal:     General: Bowel sounds are normal. There is no distension.     Palpations: Abdomen is soft.     Tenderness: There is no abdominal tenderness. There is no guarding.  Musculoskeletal:     Right lower leg: No edema.     Left lower leg: No edema.  Skin:    General: Skin is warm and dry.     Capillary Refill: Capillary refill takes less than 2 seconds.     Coloration: Skin is not jaundiced or pale.     Findings: No rash.     Nails: There is no clubbing.  Neurological:     Mental Status: He is alert. Mental status is at baseline.     Cranial Nerves: No  dysarthria or facial asymmetry.     Motor: No tremor or abnormal muscle tone.     Gait: Gait normal.  Psychiatric:        Mood and Affect: Mood normal.        Speech: Speech normal.        Behavior: Behavior normal. Behavior is cooperative.     Diabetic Foot Exam - Simple   Simple Foot Form Diabetic Foot exam was performed with the following findings: Yes 04/25/2020  3:15 PM  Visual  Inspection Sensation Testing Pulse Check Comments     ECG interpretation   Date: 04/25/20  Rate: 105   Rhythm: sinus tachycardia  QRS Axis: normal  Intervals: normal  ST/T Wave abnormalities: normal  Conduction Disutrbances: none  Narrative Interpretation:  Sinus tachycardia with poor R-wave progression  Old EKG Reviewed: 08/10/2019 EKG - rate different, otherwise no significant changes noted   Indication: Cerumen impaction of right ear  Medical necessity statement: On physical examination, cerumen impairs clinically significant portions of the external auditory canal, and tympanic membrane. Noted obstructive, copious cerumen that cannot be removed without magnification and instrumentations requiring MD/APP skills/procedure  Consent: Discussed benefits and risks of procedure and verbal consent obtained  Procedure:  Cerumen Disimpaction  Patient was prepped for the procedure.  Utilized an otoscope to assess and take note of the ear canal, the tympanic membrane, and the presence, amount, and placement of the cerumen.  Gentle ear lavage with warm water and hydrogen peroxide performed on the right ear.    Post procedure examination: shows cerumen was completely removed. Patient tolerated procedure well.  There were no complications and following the disimpaction the tympanic membrane was  visible on the right, TM intact.   Auditory canals are normal.   Pt tolerated procedure, reported no change to symptoms after removal of cerumen.   The patient is made aware that they may experience temporary vertigo, temporary hearing loss, and temporary discomfort.  If these symptom last for more than 24 hours to follow up in clinic.  Insurance will not cover for hearing screening - with tinnitus and with multiple ear complaints - defer testing to specialist    Assessment & Plan:     ICD-10-CM   1. Type 2 diabetes mellitus without complication, without long-term current use of insulin  (HCC)  E11.9 Hemoglobin A1C    Lipid panel    COMPLETE METABOLIC PANEL WITH GFR    Microalbumin, urine    metFORMIN (GLUCOPHAGE-XR) 500 MG 24 hr tablet   uncontrolled and increasing A1C, pt not taking any meds currently due to various SE - recheck labs, pt willing to retry metformin XR   2. Hyperlipidemia associated with type 2 diabetes mellitus (HCC)  E11.69 Lipid panel   99991111 COMPLETE METABOLIC PANEL WITH GFR   needs to resume repatha, continue omega 3 fatty acid supplement and healthy diet/lifestyle -patient instructed to contact specialist and pharmacy re repatha  3. Hypertension associated with type 2 diabetes mellitus (HCC)  123456 COMPLETE METABOLIC PANEL WITH GFR   I15.2    stable, well controlled, BP near goal today -he stopped his blood pressure because of suspected side effects -off Norvasc  4. Gastroesophageal reflux disease, unspecified whether esophagitis present  K21.9    sx improved he has been able to discontinue his medications, continue monitoring can use Pepcid twice daily as needed and continue diet efforts  5. Encounter for medication monitoring  Z51.81 Hemoglobin A1C    Lipid panel  COMPLETE METABOLIC PANEL WITH GFR    Microalbumin, urine    CBC with Differential/Platelet   Labs ordered  6. Need for influenza vaccination  Z23 Flu Vaccine QUAD High Dose(Fluad)  7. Statin myopathy  G72.0 Lipid panel   P49.8Y6E COMPLETE METABOLIC PANEL WITH GFR   unable to take statins, was on repatha =-cholesterol uncontrolled he does need to follow-up with specialist, recheck labs today  8. Impacted cerumen of right ear  H61.21    irrigated in office, pt tolerated, successful disimpaction   9. Tinnitus of both ears  H93.13 Ambulatory referral to ENT   f/up ENT specialists for complaints of hearing loss and tinnitus   10. Tachycardia  R00.0 EKG 12-Lead   EKG done -sinus tach - unclear etiology for tachycardia -denies pain no fever reports drinking several bottles of water today    11. Decreased hearing of both ears  H91.93 Ambulatory referral to ENT   requests referral to specialists    12. Aortic atherosclerosis (HCC)  I70.0    unable to take statin - restart repatha, seeing specialists, monitoring    Return in about 3 months (around 07/23/2020) for Routine follow-up.  Pt was in clinic for routine f/up with additional acute complaints needed procedure and also had abnormal VS today - tachycardia never improved, EKG was done - spent more than 40 min with pt for encounter today in person More than 10 min spent for chart review More than 10 + for chart documentation  Level 5 time-based billing   [name], PA-C 04/25/20 3:12 PM

## 2020-04-26 LAB — CBC WITH DIFFERENTIAL/PLATELET
Absolute Monocytes: 590 cells/uL (ref 200–950)
Basophils Absolute: 123 cells/uL (ref 0–200)
Basophils Relative: 1.5 %
Eosinophils Absolute: 180 cells/uL (ref 15–500)
Eosinophils Relative: 2.2 %
HCT: 46.6 % (ref 38.5–50.0)
Hemoglobin: 16.1 g/dL (ref 13.2–17.1)
Lymphs Abs: 2370 cells/uL (ref 850–3900)
MCH: 29.8 pg (ref 27.0–33.0)
MCHC: 34.5 g/dL (ref 32.0–36.0)
MCV: 86.1 fL (ref 80.0–100.0)
MPV: 10 fL (ref 7.5–12.5)
Monocytes Relative: 7.2 %
Neutro Abs: 4936 cells/uL (ref 1500–7800)
Neutrophils Relative %: 60.2 %
Platelets: 281 10*3/uL (ref 140–400)
RBC: 5.41 10*6/uL (ref 4.20–5.80)
RDW: 13.1 % (ref 11.0–15.0)
Total Lymphocyte: 28.9 %
WBC: 8.2 10*3/uL (ref 3.8–10.8)

## 2020-04-26 LAB — LIPID PANEL
Cholesterol: 247 mg/dL — ABNORMAL HIGH (ref <200)
HDL: 47 mg/dL (ref >=40)
LDL Cholesterol (Calc): 168 mg/dL (calc) — ABNORMAL HIGH
Non-HDL Cholesterol (Calc): 200 mg/dL (calc) — ABNORMAL HIGH (ref <130)
Total CHOL/HDL Ratio: 5.3 (calc) — ABNORMAL HIGH (ref <5.0)
Triglycerides: 170 mg/dL — ABNORMAL HIGH (ref <150)

## 2020-04-26 LAB — COMPLETE METABOLIC PANEL WITH GFR
AG Ratio: 1.6 (calc) (ref 1.0–2.5)
ALT: 190 U/L — ABNORMAL HIGH (ref 9–46)
AST: 84 U/L — ABNORMAL HIGH (ref 10–35)
Albumin: 4.4 g/dL (ref 3.6–5.1)
Alkaline phosphatase (APISO): 97 U/L (ref 35–144)
BUN: 20 mg/dL (ref 7–25)
CO2: 26 mmol/L (ref 20–32)
Calcium: 10 mg/dL (ref 8.6–10.3)
Chloride: 100 mmol/L (ref 98–110)
Creat: 0.85 mg/dL (ref 0.70–1.18)
GFR, Est African American: 99 mL/min/{1.73_m2} (ref >=60)
GFR, Est Non African American: 85 mL/min/{1.73_m2} (ref >=60)
Globulin: 2.7 g/dL (calc) (ref 1.9–3.7)
Glucose, Bld: 130 mg/dL — ABNORMAL HIGH (ref 65–99)
Potassium: 4.4 mmol/L (ref 3.5–5.3)
Sodium: 138 mmol/L (ref 135–146)
Total Bilirubin: 0.5 mg/dL (ref 0.2–1.2)
Total Protein: 7.1 g/dL (ref 6.1–8.1)

## 2020-04-26 LAB — MICROALBUMIN, URINE: Microalb, Ur: 0.4 mg/dL

## 2020-04-26 LAB — HEMOGLOBIN A1C
Hgb A1c MFr Bld: 8 % of total Hgb — ABNORMAL HIGH (ref <5.7)
Mean Plasma Glucose: 183 mg/dL
eAG (mmol/L): 10.1 mmol/L

## 2020-04-27 DIAGNOSIS — I700 Atherosclerosis of aorta: Secondary | ICD-10-CM | POA: Insufficient documentation

## 2020-05-04 ENCOUNTER — Telehealth: Payer: Self-pay

## 2020-05-04 NOTE — Progress Notes (Signed)
Spoke to patient to confirmed patient telephone appointment on 05/05/2020 for CCM at 9:00 am with [name] the Clinical pharmacist.   Patient verbalized understanding.  [place] Pharmacist Assistant [phone]

## 2020-05-05 ENCOUNTER — Ambulatory Visit (INDEPENDENT_AMBULATORY_CARE_PROVIDER_SITE_OTHER): Payer: Medicare Other

## 2020-05-05 DIAGNOSIS — E119 Type 2 diabetes mellitus without complications: Secondary | ICD-10-CM | POA: Diagnosis not present

## 2020-05-05 DIAGNOSIS — E1169 Type 2 diabetes mellitus with other specified complication: Secondary | ICD-10-CM | POA: Diagnosis not present

## 2020-05-05 DIAGNOSIS — E785 Hyperlipidemia, unspecified: Secondary | ICD-10-CM | POA: Diagnosis not present

## 2020-05-05 DIAGNOSIS — I152 Hypertension secondary to endocrine disorders: Secondary | ICD-10-CM

## 2020-05-05 DIAGNOSIS — E1159 Type 2 diabetes mellitus with other circulatory complications: Secondary | ICD-10-CM

## 2020-05-05 NOTE — Patient Instructions (Signed)
Visit Information It was great speaking with you today!  Please let me know if you have any questions about our visit.  Goals Addressed            This Visit's Progress   . Monitor and Manage My Blood Sugar-Diabetes Type 2       Timeframe:  Long-Range Goal Priority:  High Start Date:    05/05/2020                         Expected End Date:   08/17/2020                    Follow Up Date 06/22/2020     - check blood sugar daily before breakfast - check blood sugar if I feel it is too high or too low    Why is this important?    Checking your blood sugar at home helps to keep it from getting very high or very low.   Writing the results in a diary or log helps the doctor know how to care for you.   Your blood sugar log should have the time, date and the results.   Also, write down the amount of insulin or other medicine that you take.   Other information, like what you ate, exercise done and how you were feeling, will also be helpful.     Notes:        Patient Care Plan: General Pharmacy (Adult)    Problem Identified: Hypertension, Hyperlipidemia, Diabetes and Coronary Artery Disease   Priority: High    Long-Range Goal: Patient-Specific Goal   Start Date: 05/05/2020  Expected End Date: 11/02/2020  This Visit's Progress: On track  Priority: High  Note:   Current Barriers:  . Unable to achieve control of Diabetes  . Does not adhere to prescribed medication regimen  Pharmacist Clinical Goal(s):  Marland Kitchen Over the next 90 days, patient will achieve control of Diabetes as evidenced by A1c less than 7% through collaboration with PharmD and provider.   Interventions: . 1:1 collaboration with [name], PA-C regarding development and update of comprehensive plan of care as evidenced by provider attestation and co-signature . Inter-disciplinary care team collaboration (see longitudinal plan of care) . Comprehensive medication review performed; medication list updated in electronic  medical record  Hypertension (BP goal <140/90) -controlled -Current treatment: . None -Medications previously tried: Amlodipine (reflux)   -Current home readings: does not routinely monitor at home -Current dietary habits: Patient eats a high carbohydrate diet. He enjoys bread and starches, and drinks at least 1 cup of coffee daily. He does not monitor his salt intake. -Current exercise habits: He has a fitbit and has a goal of 10,000 steps daily. Recently he has been achieving closer to 5000 steps daily. He also has 10 lb. Dumbbells he uses sporadically.  -Denies hypotensive/hypertensive symptoms -Educated on Daily salt intake goal < 2300 mg; Importance of home blood pressure monitoring; -Counseled to monitor BP at home 1-2 times weekly, document, and provide log at future appointments -Counseled on diet and exercise extensively  Hyperlipidemia: (LDL goal < 70) -uncontrolled -Current treatment: . Repatha 140 mg into the skin every two weeks (not currently taking)  -Medications previously tried: Zetia  - Patient has not restarted Repatha yet, states he needs a follow-up with Cardiology, which is scheduled for May. PA was approved for 2022. -Educated on Importance of limiting foods high in cholesterol; -Recommended restarting Repatha. Will reach  out to Cardiology to request refill prior to next appt.  Diabetes (A1c goal <7%) -uncontrolled -Current medications: Marland Kitchen Metformin XR 500 mg daily  -Medications previously tried: NA  -Current home glucose readings  Breakfast Pre-lunch Pre-dinner Post-prandial   10-Feb 166      9-Feb  164 176    8-Feb 159   163   7-Feb   125 212 Started Metformin   6-Feb     305   Average 163 164 151 227    -Denies hypoglycemic/hyperglycemic symptoms - One instance of diarrhea since restarting metformin. Counseled patient to take with food and that symptoms will lessen over time.  -Educated on A1c and blood sugar goals; Carbohydrate counting and/or plate  method -Counseled to check feet daily and get yearly eye exams -Recommended to continue current medication  Patient Goals/Self-Care Activities . Over the next 90 days, patient will:  - take medications as prescribed check glucose daily (Fasting) , document, and provide at future appointments check blood pressure 1-2 times weekly , document, and provide at future appointments  Follow Up Plan: Telephone follow up appointment with care management team member scheduled for: 08/17/2020 at 9:00 AM      Patient agreed to services and verbal consent obtained.   The patient verbalized understanding of instructions, educational materials, and care plan provided today and declined offer to receive copy of patient instructions, educational materials, and care plan.   [facility] [phone]

## 2020-05-05 NOTE — Progress Notes (Signed)
Chronic Care Management Pharmacy Note  05/05/2020 Name:  Gerald Bauer MRN:  [id number] DOB:  10/04/1945  Subjective: Gerald Bauer is an 75 y.o. year old male who is a primary patient of [name], [place].  The CCM team was consulted for assistance with disease management and care coordination needs.    Engaged with patient by telephone for follow up visit in response to provider referral for pharmacy case management and/or care coordination services.   Consent to Services:  The patient was given the following information about Chronic Care Management services today, agreed to services, and gave verbal consent: 1. CCM service includes personalized support from designated clinical staff supervised by the primary care provider, including individualized plan of care and coordination with other care providers 2. 24/7 contact phone numbers for assistance for urgent and routine care needs. 3. Service will only be billed when office clinical staff spend 20 minutes or more in a month to coordinate care. 4. Only one practitioner may furnish and bill the service in a calendar month. 5.The patient may stop CCM services at any time (effective at the end of the month) by phone call to the office staff. 6. The patient will be responsible for cost sharing (co-pay) of up to 20% of the service fee (after annual deductible is met). Patient agreed to services and consent obtained.  Patient Care Team: [name], PA-C as PCP - General (Family Medicine) [name], MD as Referring Physician (Vascular Surgery) [name], MD as Consulting Physician (Gastroenterology) [name], [facility] as Pharmacist (Pharmacist)  Recent office visits: 04/25/2020: Patient presented to [name], PA-C for follow-up. A1c worsened to 8%. Patient started on metformin. Patient stopped amlodipine, famotidine. LDL worsened in setting of non-adherence to Repatha.   Recent consult visits: None in previous 6  months  Hospital visits: None in previous 6 months  Objective:  Lab Results  Component Value Date   CREATININE 0.85 04/25/2020   BUN 20 04/25/2020   GFRNONAA 85 04/25/2020   GFRAA 99 04/25/2020   NA 138 04/25/2020   K 4.4 04/25/2020   CALCIUM 10.0 04/25/2020   CO2 26 04/25/2020    Lab Results  Component Value Date/Time   HGBA1C 8.0 (H) 04/25/2020 03:57 PM   HGBA1C 7.6 (H) 11/09/2019 11:45 AM   HGBA1C 8.8 10/21/2018 12:00 AM   MICROALBUR 0.4 04/25/2020 03:57 PM   MICROALBUR 20 01/20/2019 09:16 AM   MICROALBUR 0.4 01/10/2017 09:13 AM    Last diabetic Eye exam:  Lab Results  Component Value Date/Time   HMDIABEYEEXA No Retinopathy 04/10/2019 12:00 AM    Last diabetic Foot exam: No results found for: HMDIABFOOTEX   Lab Results  Component Value Date   CHOL 247 (H) 04/25/2020   HDL 47 04/25/2020   LDLCALC 168 (H) 04/25/2020   TRIG 170 (H) 04/25/2020   CHOLHDL 5.3 (H) 04/25/2020    Hepatic Function Latest Ref Rng & Units 04/25/2020 11/09/2019 08/10/2019  Total Protein 6.1 - 8.1 g/dL 7.1 6.8 6.9  Albumin 3.7 - 4.7 g/dL - - 4.3  AST 10 - 35 U/L 84(H) 16 18  ALT 9 - 46 U/L 190(H) 19 24  Alk Phosphatase 48 - 121 IU/L - - 62  Total Bilirubin 0.2 - 1.2 mg/dL 0.5 0.6 0.6  Bilirubin, Direct 0.00 - 0.40 mg/dL - - 0.19    Lab Results  Component Value Date/Time   TSH 2.11 01/10/2017 09:13 AM    CBC Latest Ref Rng & Units  04/25/2020 11/09/2019 01/20/2019  WBC 3.8 - 10.8 Thousand/uL 8.2 7.6 7.4  Hemoglobin 13.2 - 17.1 g/dL 76.1 60.7 37.1  Hematocrit 38.5 - 50.0 % 46.6 45.5 43.5  Platelets 140 - 400 Thousand/uL 281 247 249    Lab Results  Component Value Date/Time   VD25OH 38.3 01/13/2018 09:41 AM   VD25OH 31 01/10/2017 09:13 AM    Clinical ASCVD: Yes  The 10-year ASCVD risk score Denman George DC Jr., et al., 2013) is: 48.1%   Values used to calculate the score:     Age: 75 years     Sex: Male     Is Non-Hispanic African American: No     Diabetic: Yes     Tobacco smoker:  No     Systolic Blood Pressure: 134 mmHg     Is BP treated: No     HDL Cholesterol: 47 mg/dL     Total Cholesterol: 247 mg/dL    Depression screen [facility] 2/9 04/25/2020 11/09/2019 09/10/2019  Decreased Interest 0 0 0  Down, Depressed, Hopeless 0 0 0  PHQ - 2 Score 0 0 0  Altered sleeping 0 0 -  Tired, decreased energy 0 0 -  Change in appetite 0 0 -  Feeling bad or failure about yourself  0 0 -  Trouble concentrating 0 0 -  Moving slowly or fidgety/restless 0 0 -  Suicidal thoughts 0 0 -  PHQ-9 Score 0 0 -  Difficult doing work/chores Not difficult at all Not difficult at all -  Some recent data might be hidden    Social History   Tobacco Use  Smoking Status Former Smoker  . Packs/day: 1.00  . Years: 40.00  . Pack years: 40.00  . Types: Cigarettes  . Quit date: 03/09/1997  . Years since quitting: 23.1  Smokeless Tobacco Former User  Tobacco Comment   smoking cessation materials not required   BP Readings from Last 3 Encounters:  04/25/20 134/86  11/09/19 130/80  09/10/19 (!) 142/92   Pulse Readings from Last 3 Encounters:  04/25/20 (!) 120  11/09/19 92  09/10/19 (!) 109   Wt Readings from Last 3 Encounters:  04/25/20 170 lb (77.1 kg)  11/09/19 177 lb 4.8 oz (80.4 kg)  09/10/19 177 lb 14.4 oz (80.7 kg)    Assessment/Interventions: Review of patient past medical history, allergies, medications, health status, including review of consultants reports, laboratory and other test data, was performed as part of comprehensive evaluation and provision of chronic care management services.   SDOH:  (Social Determinants of Health) assessments and interventions performed: Yes SDOH Interventions   Flowsheet Row Most Recent Value  SDOH Interventions   Financial Strain Interventions Intervention Not Indicated  Transportation Interventions Intervention Not Indicated      CCM Care Plan  Allergies  Allergen Reactions  . Amoxicillin Swelling  . Metformin And Related Other (See  Comments)    Abdominal pains  . Statins Other (See Comments)    Affects the liver  . Welchol [Colesevelam Hcl] Other (See Comments)    affects the liver    Medications Reviewed Today    Reviewed by Danelle Berry, PA-C (Physician Assistant Certified) on 04/25/20 at 1512  Med List Status: <None>  Medication Order Taking? Sig Documenting Provider Last Dose Status Informant  amLODipine (NORVASC) 5 MG tablet [id number] No Take 1 tablet (5 mg total) by mouth daily.  Patient not taking: Reported on 04/25/2020   Danelle Berry, PA-C Not Taking Active   aspirin 81 MG tablet  [id number] Yes Take 81 mg by mouth daily. [provider] Taking Active   betamethasone acetate-betamethasone sodium phosphate (CELESTONE) injection 3 mg [id number]   Edrick Kins, DPM  Active   cholecalciferol (VITAMIN D) 1000 units tablet [id number] Yes Take 1 tablet (1,000 Units total) by mouth daily. Arnetha Courser, MD Taking Active   Cyanocobalamin (VITAMIN B-12 CR PO) [id number] Yes Take 1,000 mcg by mouth daily.  [provider] Taking Active            Med Note Ezzard Flax, AMMIE L   Tue Mar 09, 2014  9:51 AM) Received from: External Pharmacy  Evolocumab [facility] SURECLICK) 518 MG/ML SOAJ [id number] No Inject 140 mg into the skin every 14 (fourteen) days.  Patient not taking: Reported on 04/25/2020   [name], MD Not Taking Active   famotidine (PEPCID) 20 MG tablet [id number] Yes TAKE 1 TABLET BY MOUTH  TWICE DAILY FOR Royal Piedra, PA-C Taking Active   Lancets (ONETOUCH DELICA PLUS [id number]) [place] [id number] Yes USE TO CHECK BLOOD GLUCOSE  ONCE DAILY AS DIRECTED [name], PA-C Taking Active   Omega-3 Fatty Acids (FISH OIL) 1200 MG CAPS [id number] Yes Take 1 capsule by mouth daily.  [provider] Taking Active   [facility] VERIO test strip [id number] Yes CHECK FINGERSTICK BLOOD  SUGARS ONCE DAILY [name], PA-C Taking Active           Patient Active Problem List    Diagnosis Date Noted  . Aortic atherosclerosis ([place]) 04/27/2020  . 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 Left foot pain 01/10/2017  . Tinea pedis of both feet 01/10/2017  . Onychomycosis 01/10/2017  . Needs flu shot 01/10/2017  . 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 SOB (shortness of breath) 07/14/2016  . Carotid stenosis 03/13/2016  . 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 07/11/2015  . Medication monitoring encounter 05/04/2015  . Ganglion cyst 02/23/2015  . Joint pain in fingers of right hand 02/23/2015  . Hyperlipidemia, unspecified   . HTN (hypertension)   . Personal history of prostate cancer   . Carotid atherosclerosis   . Fatigue 08/20/2013  . GERD (gastroesophageal reflux disease) 08/20/2013    Immunization History  Administered Date(s) Administered  . Fluad Quad(high Dose 65+) 01/20/2019, 04/25/2020  . Influenza, High Dose Seasonal PF 01/10/2017, 01/13/2018  . Influenza-Unspecified 01/10/2012, 01/01/2013, 01/01/2015  . Janssen (J&J) SARS-COV-2 Vaccination 09/08/2019  . Pneumococcal Conjugate-13 07/30/2013  . Pneumococcal Polysaccharide-23 02/27/2011  . Td 03/04/2014  . Zoster 02/17/2014  . Zoster Recombinat (Shingrix) 07/01/2016, 01/31/2017    Conditions to be addressed/monitored:  Hypertension, Hyperlipidemia, Diabetes and Coronary Artery Disease  Care Plan : General Pharmacy (Adult)  Updates made by [name], RPH since 05/05/2020 12:00 AM    Problem: Hypertension, Hyperlipidemia, Diabetes and Coronary Artery Disease   Priority: High    Long-Range Goal: Patient-Specific Goal   Start Date: 05/05/2020  Expected End Date: 11/02/2020  This Visit's Progress: On track  Priority: High  Note:   Current Barriers:  . Unable to achieve control of Diabetes  . Does not adhere to prescribed medication regimen  Pharmacist Clinical Goal(s):  Marland Kitchen Over the next 90 days, patient will achieve control of Diabetes as  evidenced by A1c less than 7% through collaboration with PharmD and provider.   Interventions: . 1:1 collaboration with [name], PA-C regarding development and update of comprehensive plan of care as evidenced by provider attestation and co-signature .  Inter-disciplinary care team collaboration (see longitudinal plan of care) . Comprehensive medication review performed; medication list updated in electronic medical record  Hypertension (BP goal <140/90) -controlled -Current treatment: . None -Medications previously tried: Amlodipine (reflux)   -Current home readings: does not routinely monitor at home -Current dietary habits: Patient eats a high carbohydrate diet. He enjoys bread and starches, and drinks at least 1 cup of coffee daily. He does not monitor his salt intake. -Current exercise habits: He has a fitbit and has a goal of 10,000 steps daily. Recently he has been achieving closer to 5000 steps daily. He also has 10 lb. Dumbbells he uses sporadically.  -Denies hypotensive/hypertensive symptoms -Educated on Daily salt intake goal < 2300 mg; Importance of home blood pressure monitoring; -Counseled to monitor BP at home 1-2 times weekly, document, and provide log at future appointments -Counseled on diet and exercise extensively  Hyperlipidemia: (LDL goal < 70) -uncontrolled -Current treatment: . Repatha 140 mg into the skin every two weeks (not currently taking)  -Medications previously tried: Zetia  - Patient has not restarted Repatha yet, states he needs a follow-up with Cardiology, which is scheduled for May. PA was approved for 2022. -Educated on Importance of limiting foods high in cholesterol; -Recommended restarting Repatha. Will reach out to Cardiology to request refill prior to next appt.  Diabetes (A1c goal <7%) -uncontrolled -Current medications: Marland Kitchen Metformin XR 500 mg daily  -Medications previously tried: NA  -Current home glucose readings  Breakfast Pre-lunch  Pre-dinner Post-prandial   10-Feb 166      9-Feb  164 176    8-Feb 159   163   7-Feb   125 212 Started Metformin   6-Feb     305   Average 163 164 151 227    -Denies hypoglycemic/hyperglycemic symptoms - One instance of diarrhea since restarting metformin. Counseled patient to take with food and that symptoms will lessen over time.  -Educated on A1c and blood sugar goals; Carbohydrate counting and/or plate method -Counseled to check feet daily and get yearly eye exams -Recommended to continue current medication  Patient Goals/Self-Care Activities . Over the next 90 days, patient will:  - take medications as prescribed check glucose daily (Fasting) , document, and provide at future appointments check blood pressure 1-2 times weekly , document, and provide at future appointments  Follow Up Plan: Telephone follow up appointment with care management team member scheduled for: 08/17/2020 at 9:00 AM      Medication Assistance: None required.  Patient affirms current coverage meets needs.  Patient's preferred pharmacy is:  [facility], [address], [address], [place] [address] Phone: [phone] Fax: [facility], [address] [place] [address] Phone: [phone] Fax: [phone]  Uses pill box? Yes Pt endorses 100% compliance  We discussed: Current pharmacy is preferred with insurance plan and patient is satisfied with pharmacy services Patient decided to: Continue current medication management strategy  Care Plan and Follow Up Patient Decision:  Patient agrees to Care Plan and Follow-up.  Plan: Telephone follow up appointment with care management team member scheduled for:  08/17/20 at 9:00 AM  [facility] [phone]

## 2020-05-09 ENCOUNTER — Other Ambulatory Visit: Payer: Self-pay

## 2020-05-09 MED ORDER — REPATHA SURECLICK 140 MG/ML ~~LOC~~ SOAJ
140.0000 mg | SUBCUTANEOUS | 11 refills | Status: DC
Start: 2020-05-09 — End: 2021-03-02

## 2020-05-09 NOTE — Telephone Encounter (Signed)
Pt seen at chronic care management clinic 2/10, pt has been out of his Repatha refills, his cholesterol has worsen, pt wanted to wait until f/u viist with Dr. Rockey Situ to have refills, however Dr. Rockey Situ sent a secure message to refill Repatha for one year refills. Refill sent to [facility]

## 2020-05-27 ENCOUNTER — Telehealth: Payer: Self-pay

## 2020-05-27 NOTE — Progress Notes (Signed)
Chronic Care Management Pharmacy Assistant   Name: Gerald Bauer  MRN: [id number] DOB: 08-11-1945  Reason for Encounter:Diabetes  Disease State Call.  Patient Questions:  1.  Have you seen any other providers since your last visit? No  2.  Any changes in your medicines or health? No     PCP : [name], PA-C  Allergies:   Allergies  Allergen Reactions  . Amoxicillin Swelling  . Metformin And Related Other (See Comments)    Abdominal pains  . Statins Other (See Comments)    Affects the liver  . Welchol [Colesevelam Hcl] Other (See Comments)    affects the liver    Medications: Outpatient Encounter Medications as of 05/27/2020  Medication Sig Note  . aspirin 81 MG tablet Take 81 mg by mouth daily.   . cholecalciferol (VITAMIN D) 1000 units tablet Take 1 tablet (1,000 Units total) by mouth daily.   . Cyanocobalamin (VITAMIN B-12 CR PO) Take 1,000 mcg by mouth daily.  03/09/2014: Received from: External Pharmacy  . Evolocumab (REPATHA SURECLICK) 485 MG/ML SOAJ Inject 140 mg into the skin every 14 (fourteen) days.   . Lancets (ONETOUCH DELICA PLUS [id number]) MISC USE TO CHECK BLOOD GLUCOSE  ONCE DAILY AS DIRECTED   . metFORMIN (GLUCOPHAGE-XR) 500 MG 24 hr tablet Take 1 tablet (500 mg total) by mouth daily with breakfast.   . Omega-3 Fatty Acids (FISH OIL) 1200 MG CAPS Take 1 capsule by mouth daily.    [name] test strip CHECK FINGERSTICK BLOOD  SUGARS ONCE DAILY    Facility-Administered Encounter Medications as of 05/27/2020  Medication  . betamethasone acetate-betamethasone sodium phosphate (CELESTONE) injection 3 mg    Current Diagnosis: Patient Active Problem List   Diagnosis Date Noted  . Aortic atherosclerosis ([place]) 04/27/2020  . 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 Left foot pain 01/10/2017  . Tinea pedis of both feet 01/10/2017  . Onychomycosis 01/10/2017  . Needs flu shot 01/10/2017  . 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 SOB (shortness of breath) 07/14/2016  . Carotid stenosis 03/13/2016  . 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 07/11/2015  . Medication monitoring encounter 05/04/2015  . Ganglion cyst 02/23/2015  . Joint pain in fingers of right hand 02/23/2015  . Hyperlipidemia, unspecified   . HTN (hypertension)   . Personal history of prostate cancer   . Carotid atherosclerosis   . Fatigue 08/20/2013  . GERD (gastroesophageal reflux disease) 08/20/2013    Goals Addressed            This Visit's Progress   . DIET - INCREASE WATER INTAKE   On track    Recommend to drink at least 6-8 8oz glasses of water per day.    . Monitor and Manage My Blood Sugar-Diabetes Type 2   Not on track    Timeframe:  Long-Range Goal Priority:  High Start Date:    05/05/2020                         Expected End Date:   08/17/2020                    Follow Up Date 06/22/2020     - check blood sugar daily before breakfast - check blood sugar if I feel it is too high or too low    Why is this important?    Checking your blood  sugar at home helps to keep it from getting very high or very low.   Writing the results in a diary or log helps the doctor know how to care for you.   Your blood sugar log should have the time, date and the results.   Also, write down the amount of insulin or other medicine that you take.   Other information, like what you ate, exercise done and how you were feeling, will also be helpful.     Notes:       Recent Relevant Labs: Lab Results  Component Value Date/Time   HGBA1C 8.0 (H) 04/25/2020 03:57 PM   HGBA1C 7.6 (H) 11/09/2019 11:45 AM   HGBA1C 8.8 10/21/2018 12:00 AM   MICROALBUR 0.4 04/25/2020 03:57 PM   MICROALBUR 20 01/20/2019 09:16 AM   MICROALBUR 0.4 01/10/2017 09:13 AM    Kidney Function Lab Results  Component Value Date/Time   CREATININE 0.85 04/25/2020 03:57 PM   CREATININE 0.82 11/09/2019 11:45 AM   GFRNONAA 85 04/25/2020 03:57 PM    GFRAA 99 04/25/2020 03:57 PM    . Current antihyperglycemic regimen:   Metformin XR 500 mg daily   Patient denies any side effects from metformin.Patient states he is doing great. . What recent interventions/DTPs have been made to improve glycemic control: o Per CPP Note on 05/05/2020 - take medications as prescribed - check glucose daily (Fasting) document, and provide at future appointments . Have there been any recent hospitalizations or ED visits since last visit with CPP? No . Patient reports hypoglycemic symptoms, including Vision changes  o Patient states he believes the vision change is from his cataracts.Patient reports he is going to makes appointment with his eye doctor soon. . Patient denies hyperglycemic symptoms, including blurry vision, excessive thirst, fatigue, polyuria and weakness . How often are you checking your blood sugar? 2 to 3 times a week .  Marland Kitchen What are your blood sugars ranging?  o Fasting: N/A o Before meals:  - On 05/07/2020 at 8:26 am it was 152 o After meals:  - On 05/20/2020 at 10:34 am it was 174. - On 05/20/2020 at 2:04pm it was 166. - On 05/18/2020 at 10:55 am it was 161. - On 05/16/2020 at 8:03 pm  It was 250. - On 05/13/2020 at 5:15 pm it was 160. o Bedtime: N/A . During the week, how often does your blood glucose drop below 70? Patient states he is unsure if it has drop below 70 this week because he has not check his blood sugar this week. . Are you checking your feet daily/regularly?   Patient Denies Numbness, pain or tingling in his feet.  Patient states he is drinking mostly water and watching his intake of carborhydrates.  Adherence Review: Is the patient currently on a STATIN medication? No Is the patient currently on ACE/ARB medication? No Does the patient have >5 day gap between last estimated fill dates? Yes   Maryjean Ka  Follow-Up:  Pharmacist Review   [place] Clinical Pharmacist Assistant [phone]

## 2020-07-06 ENCOUNTER — Telehealth: Payer: Self-pay | Admitting: Family Medicine

## 2020-07-06 DIAGNOSIS — E119 Type 2 diabetes mellitus without complications: Secondary | ICD-10-CM

## 2020-07-06 NOTE — Telephone Encounter (Addendum)
Patient requesting a print out of the following medication: metFORMIN (GLUCOPHAGE-XR) 500 MG 24 hr tablet to provide to the [place]. Please note the frequency. Patient will pick up tomorrow.

## 2020-07-07 MED ORDER — METFORMIN HCL ER 500 MG PO TB24: 500.0000 mg | ORAL_TABLET | Freq: Every day | ORAL | 1 refills | Status: DC

## 2020-07-07 NOTE — Telephone Encounter (Signed)
Patient only needed a up to date copy of medication list

## 2020-07-21 ENCOUNTER — Ambulatory Visit (INDEPENDENT_AMBULATORY_CARE_PROVIDER_SITE_OTHER): Payer: Medicare Other | Admitting: Family Medicine

## 2020-07-21 ENCOUNTER — Encounter: Payer: Self-pay | Admitting: Family Medicine

## 2020-07-21 ENCOUNTER — Other Ambulatory Visit: Payer: Self-pay

## 2020-07-21 VITALS — BP 128/80 | HR 98 | Temp 98.1°F | Resp 16 | Ht 67.0 in | Wt 174.8 lb

## 2020-07-21 DIAGNOSIS — E119 Type 2 diabetes mellitus without complications: Secondary | ICD-10-CM

## 2020-07-21 DIAGNOSIS — K219 Gastro-esophageal reflux disease without esophagitis: Secondary | ICD-10-CM

## 2020-07-21 DIAGNOSIS — E1159 Type 2 diabetes mellitus with other circulatory complications: Secondary | ICD-10-CM | POA: Diagnosis not present

## 2020-07-21 DIAGNOSIS — E1169 Type 2 diabetes mellitus with other specified complication: Secondary | ICD-10-CM

## 2020-07-21 DIAGNOSIS — I152 Hypertension secondary to endocrine disorders: Secondary | ICD-10-CM

## 2020-07-21 DIAGNOSIS — E785 Hyperlipidemia, unspecified: Secondary | ICD-10-CM

## 2020-07-21 DIAGNOSIS — Z5181 Encounter for therapeutic drug level monitoring: Secondary | ICD-10-CM

## 2020-07-21 DIAGNOSIS — R7989 Other specified abnormal findings of blood chemistry: Secondary | ICD-10-CM

## 2020-07-21 NOTE — Progress Notes (Signed)
Name: Gerald Bauer   MRN: [id number]    DOB: December 12, 1945   Date:07/21/2020       Progress Note  Chief Complaint  Patient presents with  . Follow-up  . Diabetes     Subjective:   Gerald Bauer is a 75 y.o. male, presents to clinic for routine f/up  DM uncontrolled with increasing A1C He started metformin 500 mg once daily with food  Denies: Polyuria, polydipsia, vision changes, neuropathy, hypoglycemia Recent pertinent labs: Lab Results  Component Value Date   HGBA1C 8.0 (H) 04/25/2020   HGBA1C 7.6 (H) 11/09/2019   HGBA1C 7.1 (H) 08/10/2019   Standard of care and health maintenance: Urine Microalbumin:  UTD Foot exam:  UTD DM eye exam:  due ACEI/ARB:  Not on Statin:  Does not tolerate - repatha per cardiology  HLD - repatha per cardiology, fish oil supplement Lab Results  Component Value Date   CHOL 247 (H) 04/25/2020   HDL 47 04/25/2020   LDLCALC 168 (H) 04/25/2020   TRIG 170 (H) 04/25/2020   CHOLHDL 5.3 (H) 04/25/2020   GERD - on pepcid  BP Readings from Last 3 Encounters:  07/21/20 128/80  04/25/20 134/86  11/09/19 130/80   BP at goal today - not on ACEI/ARB  Started eating more salads  Wt Readings from Last 5 Encounters:  07/21/20 174 lb 12.8 oz (79.3 kg)  04/25/20 170 lb (77.1 kg)  11/09/19 177 lb 4.8 oz (80.4 kg)  09/10/19 177 lb 14.4 oz (80.7 kg)  08/10/19 178 lb 6 oz (80.9 kg)   BMI Readings from Last 5 Encounters:  07/21/20 27.38 kg/m  04/25/20 26.63 kg/m  11/09/19 27.77 kg/m  09/10/19 27.86 kg/m  08/10/19 27.94 kg/m       Current Outpatient Medications:  .  aspirin 81 MG tablet, Take 81 mg by mouth daily., Disp: , Rfl:  .  cholecalciferol (VITAMIN D) 1000 units tablet, Take 1 tablet (1,000 Units total) by mouth daily., Disp: 90 tablet, Rfl: 3 .  Cyanocobalamin (VITAMIN B-12 CR PO), Take 1,000 mcg by mouth daily. , Disp: , Rfl:  .  Evolocumab (REPATHA SURECLICK) [id number] MG/ML SOAJ, Inject 140 mg into the skin every 14  (fourteen) days., Disp: 2 mL, Rfl: 11 .  famotidine (PEPCID) 20 MG tablet, Take 20 mg by mouth 2 (two) times daily., Disp: , Rfl:  .  Lancets (ONETOUCH DELICA PLUS 123XX123) MISC, USE TO CHECK BLOOD GLUCOSE  ONCE DAILY AS DIRECTED, Disp: 100 each, Rfl: 3 .  metFORMIN (GLUCOPHAGE-XR) 500 MG 24 hr tablet, Take 1 tablet (500 mg total) by mouth daily with breakfast., Disp: 90 tablet, Rfl: 1 .  mometasone (ELOCON) 0.1 % lotion, Apply topically., Disp: , Rfl:  .  Omega-3 Fatty Acids (FISH OIL) 1200 MG CAPS, Take 1 capsule by mouth daily. , Disp: , Rfl:  .  ONETOUCH VERIO test strip, CHECK FINGERSTICK BLOOD  SUGARS ONCE DAILY, Disp: 100 strip, Rfl: 3  Current Facility-Administered Medications:  .  betamethasone acetate-betamethasone sodium phosphate (CELESTONE) injection 3 mg, 3 mg, Intramuscular, Once, Edrick Kins, DPM  Patient Active Problem List   Diagnosis Date Noted  . Aortic atherosclerosis ([place]) 04/27/2020  . 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 Left foot pain 01/10/2017  . Tinea pedis of both feet 01/10/2017  . Onychomycosis 01/10/2017  . Needs flu shot 01/10/2017  . Onychomycosis of multiple toenails with type 2 diabetes mellitus ([place]) 07/19/2016  . SOB (shortness of breath) 07/14/2016  . Carotid stenosis 03/13/2016  .  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 07/11/2015  . Medication monitoring encounter 05/04/2015  . Ganglion cyst 02/23/2015  . Joint pain in fingers of right hand 02/23/2015  . Hyperlipidemia, unspecified   . HTN (hypertension)   . Personal history of prostate cancer   . Carotid atherosclerosis   . Fatigue 08/20/2013  . GERD (gastroesophageal reflux disease) 08/20/2013    Past Surgical History:  Procedure Laterality Date  . APPENDECTOMY  1973  . CHOLECYSTECTOMY  2013  . PROSTATECTOMY  2012    Family History  Problem Relation Age of Onset  . Dementia Mother   . High Cholesterol Mother   . Hypertension Mother   . Arthritis Mother   . Hyperlipidemia  Mother   . Heart disease Father   . Heart attack Father   . Arthritis Maternal Grandmother   . Cancer Maternal Grandmother        colon  . Prostate cancer Brother   . Cancer Brother        prostate  . Cancer Maternal Uncle        colon  . COPD Maternal Uncle   . Stroke Paternal Grandmother   . Diabetes Paternal Grandmother   . Aneurysm Paternal Grandfather     Social History   Tobacco Use  . Smoking status: Former Smoker    Packs/day: 1.00    Years: 40.00    Pack years: 40.00    Types: Cigarettes    Quit date: 03/09/1997    Years since quitting: 23.3  . Smokeless tobacco: Former Systems developer  . Tobacco comment: smoking cessation materials not required  Vaping Use  . Vaping Use: Never used  Substance Use Topics  . Alcohol use: No    Alcohol/week: 0.0 standard drinks  . Drug use: No     Allergies  Allergen Reactions  . Amoxicillin Swelling  . Metformin And Related Other (See Comments)    Abdominal pains  . Statins Other (See Comments)    Affects the liver  . Welchol [Colesevelam Hcl] Other (See Comments)    affects the liver    Health Maintenance  Topic Date Due  . COVID-19 Vaccine (2 - Booster for [employer] series) 11/03/2019  . OPHTHALMOLOGY EXAM  04/09/2020  . COLONOSCOPY (Pts 45-25yrs Insurance coverage will need to be confirmed)  04/25/2021 (Originally 10/01/2018)  . HEMOGLOBIN A1C  10/23/2020  . INFLUENZA VACCINE  10/24/2020  . FOOT EXAM  04/25/2021  . URINE MICROALBUMIN  04/25/2021  . TETANUS/TDAP  03/04/2024  . Hepatitis C Screening  Completed  . PNA vac Low Risk Adult  Completed  . HPV VACCINES  Aged Out    Chart Review Today: I personally reviewed active problem list, medication list, allergies, family history, social history, health maintenance, notes from last encounter, lab results, imaging with the patient/caregiver today.   Review of Systems  Constitutional: Negative.   HENT: Negative.   Eyes: Negative.   Respiratory: Negative.    Cardiovascular: Negative.   Gastrointestinal: Negative.   Endocrine: Negative.   Genitourinary: Negative.   Musculoskeletal: Negative.   Skin: Negative.   Allergic/Immunologic: Negative.   Neurological: Negative.   Hematological: Negative.   Psychiatric/Behavioral: Negative.   All other systems reviewed and are negative.    Objective:   Vitals:   07/21/20 1415  BP: 128/80  Pulse: 98  Resp: 16  Temp: 98.1 F (36.7 C)  SpO2: 99%  Weight: 174 lb 12.8 oz (79.3 kg)  Height: 5\' 7"  (1.702 m)  Body mass index is 27.38 kg/m.  Physical Exam Vitals and nursing note reviewed.  Constitutional:      General: He is not in acute distress.    Appearance: Normal appearance. He is well-developed. He is not ill-appearing, toxic-appearing or diaphoretic.     Interventions: Face mask in place.     Comments: Elderly male, well appearing  HENT:     Head: Normocephalic and atraumatic.     Jaw: No trismus.     Right Ear: External ear normal.     Left Ear: External ear normal.  Eyes:     General: Lids are normal. No scleral icterus.       Right eye: No discharge.        Left eye: No discharge.     Conjunctiva/sclera: Conjunctivae normal.  Neck:     Trachea: Trachea and phonation normal. No tracheal deviation.  Cardiovascular:     Rate and Rhythm: Normal rate and regular rhythm.     Pulses: Normal pulses.          Radial pulses are 2+ on the right side and 2+ on the left side.       Posterior tibial pulses are 2+ on the right side and 2+ on the left side.     Heart sounds: Normal heart sounds. No murmur heard. No friction rub. No gallop.   Pulmonary:     Effort: Pulmonary effort is normal. No respiratory distress.     Breath sounds: Normal breath sounds. No stridor. No wheezing, rhonchi or rales.  Abdominal:     General: Bowel sounds are normal. There is no distension.     Palpations: Abdomen is soft.  Musculoskeletal:     Right lower leg: No edema.     Left lower leg: No  edema.  Skin:    General: Skin is warm and dry.     Coloration: Skin is not jaundiced.     Findings: No rash.     Nails: There is no clubbing.  Neurological:     Mental Status: He is alert. Mental status is at baseline.     Cranial Nerves: No dysarthria or facial asymmetry.     Motor: No tremor or abnormal muscle tone.     Gait: Gait normal.  Psychiatric:        Mood and Affect: Mood normal.        Speech: Speech normal.        Behavior: Behavior normal. Behavior is cooperative.         Assessment & Plan:     ICD-10-CM   1. Type 2 diabetes mellitus without complication, without long-term current use of insulin (HCC)  A41.6 COMPLETE METABOLIC PANEL WITH GFR    Lipid panel    Hemoglobin A1c    Ambulatory referral to Ophthalmology  2. Hyperlipidemia associated with type 2 diabetes mellitus (HCC)  S06.30 COMPLETE METABOLIC PANEL WITH GFR   E78.5 Lipid panel  3. Hypertension associated with type 2 diabetes mellitus (HCC)  Z60.10 COMPLETE METABOLIC PANEL WITH GFR   I15.2   4. Gastroesophageal reflux disease, unspecified whether esophagitis present  K21.9 famotidine (PEPCID) 20 MG tablet  5. Encounter for medication monitoring  X32.35 COMPLETE METABOLIC PANEL WITH GFR    Lipid panel    Hemoglobin A1c  6. Elevated LFTs  T73.22 COMPLETE METABOLIC PANEL WITH GFR   AST and ALT sig elevated relative to last labs and ALT > 3x high normal range - recheck labs, avoid tylenol ETOH  Return in about 6 months (around 01/20/2021) for Routine follow-up.   [name], PA-C 07/21/20 2:21 PM

## 2020-07-21 NOTE — Patient Instructions (Signed)
Return to complete labs in 1-2 months to recheck cholesterol and A1C - it is too soon to check today and insurance will not pay for labs.  Lab Results  Component Value Date   HGBA1C 8.0 (H) 04/25/2020    Lab Results  Component Value Date   CHOL 247 (H) 04/25/2020   HDL 47 04/25/2020   LDLCALC 168 (H) 04/25/2020   TRIG 170 (H) 04/25/2020   CHOLHDL 5.3 (H) 04/25/2020   Please get your diabetic eye exam done - we can refer you if you need a new referral  Health Maintenance  Topic Date Due  . COVID-19 Vaccine (2 - Booster for [employer] series) 11/03/2019  . Eye exam for diabetics  04/09/2020  . Colon Cancer Screening  04/25/2021*  . Hemoglobin A1C  10/23/2020  . Flu Shot  10/24/2020  . Complete foot exam   04/25/2021  . Urine Protein Check  04/25/2021  . Tetanus Vaccine  03/04/2024  .  Hepatitis C: One time screening is recommended by Center for Disease Control  (CDC) for  adults born from [birth year] through 1966.   Completed  . Pneumonia vaccines  Completed  . HPV Vaccine  Aged Out  *Topic was postponed. The date shown is not the original due date.

## 2020-08-16 ENCOUNTER — Telehealth: Payer: Self-pay

## 2020-08-16 NOTE — Progress Notes (Signed)
Spoke to patient to confirmed patient telephone appointment on 08/17/2020 for CCM at 9:00 am with [name] the Clinical pharmacist.    Patient verbalized understanding.Patient aware to have all medications, supplements, blood pressure and  blood sugar logs to visit.  Questions: Have you had any recent office visit or specialist visit outside of [place]?  No Are there any concerns you would like to discuss during your office visit?  No  Star Rating Drug:  Metformin 500 mg last filled on 07/12/2020 for 90 day supply at [facility].  [place] Pharmacist Assistant [phone]

## 2020-08-17 ENCOUNTER — Ambulatory Visit (INDEPENDENT_AMBULATORY_CARE_PROVIDER_SITE_OTHER): Payer: Medicare Other

## 2020-08-17 DIAGNOSIS — E119 Type 2 diabetes mellitus without complications: Secondary | ICD-10-CM | POA: Diagnosis not present

## 2020-08-17 DIAGNOSIS — E785 Hyperlipidemia, unspecified: Secondary | ICD-10-CM

## 2020-08-17 DIAGNOSIS — E1159 Type 2 diabetes mellitus with other circulatory complications: Secondary | ICD-10-CM

## 2020-08-17 DIAGNOSIS — I152 Hypertension secondary to endocrine disorders: Secondary | ICD-10-CM

## 2020-08-17 DIAGNOSIS — E1169 Type 2 diabetes mellitus with other specified complication: Secondary | ICD-10-CM

## 2020-08-17 MED ORDER — METFORMIN HCL ER 500 MG PO TB24: 500.0000 mg | ORAL_TABLET | Freq: Two times a day (BID) | ORAL | 0 refills | Status: DC

## 2020-08-17 NOTE — Patient Instructions (Signed)
Visit Information It was great speaking with you today!  Please let me know if you have any questions about our visit.  Goals Addressed            This Visit's Progress   . Monitor and Manage My Blood Sugar-Diabetes Type 2   On track    Timeframe:  Long-Range Goal Priority:  High Start Date:    05/05/2020                         Expected End Date:   03/19/2021                    Follow Up Date 10/22/2020     - check blood sugar daily before breakfast - check blood sugar if I feel it is too high or too low    Why is this important?    Checking your blood sugar at home helps to keep it from getting very high or very low.   Writing the results in a diary or log helps the doctor know how to care for you.   Your blood sugar log should have the time, date and the results.   Also, write down the amount of insulin or other medicine that you take.   Other information, like what you ate, exercise done and how you were feeling, will also be helpful.     Notes:     . Patient Stated   Not on track    Walk 10,000 steps daily       Patient Care Plan: General Pharmacy (Adult)    Problem Identified: Hypertension, Hyperlipidemia, Diabetes and Coronary Artery Disease   Priority: High    Long-Range Goal: Patient-Specific Goal   Start Date: 05/05/2020  Expected End Date: 11/02/2020  This Visit's Progress: Not on track  Recent Progress: On track  Priority: High  Note:   Current Barriers:  . Unable to achieve control of Diabetes  . Does not adhere to prescribed medication regimen  Pharmacist Clinical Goal(s):  Marland Kitchen Over the next 90 days, patient will achieve control of Diabetes as evidenced by A1c less than 7% through collaboration with PharmD and provider.   Interventions: . 1:1 collaboration with [name], PA-C regarding development and update of comprehensive plan of care as evidenced by provider attestation and co-signature . Inter-disciplinary care team collaboration (see  longitudinal plan of care) . Comprehensive medication review performed; medication list updated in electronic medical record  Hypertension (BP goal <140/90) -Controlled -Current treatment: . None -Medications previously tried: Amlodipine (reflux)   -Current home readings: 138/82, 125/87, 133/79  -Denies hypotensive/hypertensive symptoms -Educated on Daily salt intake goal < 2300 mg; -Counseled to monitor BP at home 1-2 times weekly, document, and provide log at future appointments -Counseled on diet and exercise extensively  Hyperlipidemia: (LDL goal < 70) -Uncontrolled -Current treatment: . Repatha 140 mg into the skin every two weeks (Resumed taking in late February)  -Medications previously tried: Zetia  -Patient concerned that Repatha may be increasing his blood sugars as he has noticed his readings tend to be higher the 2-3 days after he administers the injection.  -Continue current medications   Diabetes (A1c goal <7%) -uncontrolled -Patient reports POC A1c of 7.5% during home nurse visit on 07/29/20 -Current medications: Marland Kitchen Metformin XR 500 mg daily  -Medications previously tried: NA  -Current home glucose readings  Fasting After Breakfast Before Supper  25-May 177    22-May 164  177  19-May 175    18-May  200   17-May 184    16-May  191   Average 175 196    -Denies hypoglycemic/hyperglycemic symptoms -Current meal patterns:  . breakfast: Eggs OR English Muffin + Peanut Butter + coffee   . lunch: [employer] Chicken + Green beans + Mashed Potatoes +Green tea  . dinner: 2 Waffles + Sugar Free Syrup + coffee  . snacks: rarely snacks. Might have potato chips OR protein bars  . drinks: Water. Diet coke once daily  -Current exercise: Exercising less recently.  -Counseled on carbohydrate counseling; importance of routine exercise.  -Recommended increasing metformin to 500 mg twice daily -Recommend increasing activity. Patient set goal of at least 10 minutes of walking every  other day and one day of higher-intensity activity (stationary bike, dumbbells)   Patient Goals/Self-Care Activities . Over the next 90 days, patient will:  - take medications as prescribed check glucose daily (Fasting) , document, and provide at future appointments check blood pressure 1-2 times weekly , document, and provide at future appointments  Follow Up Plan: Telephone follow up appointment with care management team member scheduled for:  11/17/2020 at 9:00 AM     Patient agreed to services and verbal consent obtained.   The patient verbalized understanding of instructions, educational materials, and care plan provided today and declined offer to receive copy of patient instructions, educational materials, and care plan.   Doristine Section, [place] [facility] [phone]

## 2020-08-17 NOTE — Progress Notes (Signed)
Chronic Care Management Pharmacy Note  08/17/2020 Name:  Gerald Bauer MRN:  [id number] DOB:  05-26-1945  Subjective: Gerald Bauer is an 75 y.o. year old male who is a primary patient of [name], [place].  The CCM team was consulted for assistance with disease management and care coordination needs.    Engaged with patient by telephone for follow up visit in response to provider referral for pharmacy case management and/or care coordination services.   Consent to Services:  The patient was given information about Chronic Care Management services, agreed to services, and gave verbal consent prior to initiation of services.  Please see initial visit note for detailed documentation.   Patient Care Team: [name], PA-C as PCP - General (Family Medicine) [name], MD as Referring Physician (Vascular Surgery) [name], MD as Consulting Physician (Gastroenterology) [name], [facility] as Pharmacist (Pharmacist) [name], MD as Consulting Physician (Cardiology)  Recent office visits: 07/21/20: Patient presented to [name], PA-C for follow-up. No medication changes made.  04/25/2020: Patient presented to [name], PA-C for follow-up. A1c worsened to 8%. Patient started on metformin. Patient stopped amlodipine, famotidine. LDL worsened in setting of non-adherence to Repatha.   Recent consult visits: None in previous 6 months  Hospital visits: None in previous 6 months  Objective:  Lab Results  Component Value Date   CREATININE 0.85 04/25/2020   BUN 20 04/25/2020   GFRNONAA 85 04/25/2020   GFRAA 99 04/25/2020   NA 138 04/25/2020   K 4.4 04/25/2020   CALCIUM 10.0 04/25/2020   CO2 26 04/25/2020    Lab Results  Component Value Date/Time   HGBA1C 8.0 (H) 04/25/2020 03:57 PM   HGBA1C 7.6 (H) 11/09/2019 11:45 AM   HGBA1C 8.8 10/21/2018 12:00 AM   MICROALBUR 0.4 04/25/2020 03:57 PM   MICROALBUR 20 01/20/2019 09:16 AM   MICROALBUR 0.4  01/10/2017 09:13 AM    Last diabetic Eye exam:  Lab Results  Component Value Date/Time   HMDIABEYEEXA No Retinopathy 04/10/2019 12:00 AM    Last diabetic Foot exam: No results found for: HMDIABFOOTEX   Lab Results  Component Value Date   CHOL 247 (H) 04/25/2020   HDL 47 04/25/2020   LDLCALC 168 (H) 04/25/2020   TRIG 170 (H) 04/25/2020   CHOLHDL 5.3 (H) 04/25/2020    Hepatic Function Latest Ref Rng & Units 04/25/2020 11/09/2019 08/10/2019  Total Protein 6.1 - 8.1 g/dL 7.1 6.8 6.9  Albumin 3.7 - 4.7 g/dL - - 4.3  AST 10 - 35 U/L 84(H) 16 18  ALT 9 - 46 U/L 190(H) 19 24  Alk Phosphatase 48 - 121 IU/L - - 62  Total Bilirubin 0.2 - 1.2 mg/dL 0.5 0.6 0.6  Bilirubin, Direct 0.00 - 0.40 mg/dL - - 0.19    Lab Results  Component Value Date/Time   TSH 2.11 01/10/2017 09:13 AM    CBC Latest Ref Rng & Units 04/25/2020 11/09/2019 01/20/2019  WBC 3.8 - 10.8 Thousand/uL 8.2 7.6 7.4  Hemoglobin 13.2 - 17.1 g/dL 16.1 15.6 15.1  Hematocrit 38.5 - 50.0 % 46.6 45.5 43.5  Platelets 140 - 400 Thousand/uL 281 247 249    Lab Results  Component Value Date/Time   VD25OH 38.3 01/13/2018 09:41 AM   VD25OH 31 01/10/2017 09:13 AM    Clinical ASCVD: Yes  The 10-year ASCVD risk score Mikey Bussing DC Jr., et al., 2013) is: 45.4%   Values used to calculate the score:  Age: 15 years     Sex: Male     Is Non-Hispanic African American: No     Diabetic: Yes     Tobacco smoker: No     Systolic Blood Pressure: 128 mmHg     Is BP treated: No     HDL Cholesterol: 47 mg/dL     Total Cholesterol: 247 mg/dL    Depression screen [facility] 2/9 07/21/2020 04/25/2020 11/09/2019  Decreased Interest 0 0 0  Down, Depressed, Hopeless 0 0 0  PHQ - 2 Score 0 0 0  Altered sleeping 0 0 0  Tired, decreased energy 0 0 0  Change in appetite 0 0 0  Feeling bad or failure about yourself  0 0 0  Trouble concentrating 0 0 0  Moving slowly or fidgety/restless 0 0 0  Suicidal thoughts 0 0 0  PHQ-9 Score 0 0 0  Difficult doing  work/chores Not difficult at all Not difficult at all Not difficult at all  Some recent data might be hidden    Social History   Tobacco Use  Smoking Status Former Smoker  . Packs/day: 1.00  . Years: 40.00  . Pack years: 40.00  . Types: Cigarettes  . Quit date: 03/09/1997  . Years since quitting: 23.4  Smokeless Tobacco Former User  Tobacco Comment   smoking cessation materials not required   BP Readings from Last 3 Encounters:  07/21/20 128/80  04/25/20 134/86  11/09/19 130/80   Pulse Readings from Last 3 Encounters:  07/21/20 98  04/25/20 (!) 120  11/09/19 92   Wt Readings from Last 3 Encounters:  07/21/20 174 lb 12.8 oz (79.3 kg)  04/25/20 170 lb (77.1 kg)  11/09/19 177 lb 4.8 oz (80.4 kg)    Assessment/Interventions: Review of patient past medical history, allergies, medications, health status, including review of consultants reports, laboratory and other test data, was performed as part of comprehensive evaluation and provision of chronic care management services.   SDOH:  (Social Determinants of Health) assessments and interventions performed: Yes SDOH Interventions   Flowsheet Row Most Recent Value  SDOH Interventions   Financial Strain Interventions Intervention Not Indicated      CCM Care Plan  Allergies  Allergen Reactions  . Amoxicillin Swelling  . Metformin And Related Other (See Comments)    Abdominal pains  . Statins Other (See Comments)    Affects the liver  . Welchol [Colesevelam Hcl] Other (See Comments)    affects the liver    Medications Reviewed Today    Reviewed by Gaspar Cola, [facility] (Pharmacist) on 08/17/20 at 0935  Med List Status: <None>  Medication Order Taking? Sig Documenting Provider Last Dose Status Informant  aspirin 81 MG tablet [id number] Yes Take 81 mg by mouth daily. [provider] Taking Active   betamethasone acetate-betamethasone sodium phosphate (CELESTONE) injection 3 mg [id number]   Felecia Shelling, DPM   Active   cholecalciferol (VITAMIN D) 1000 units tablet [id number]  Take 1 tablet (1,000 Units total) by mouth daily. Kerman Passey, MD  Active   Cyanocobalamin (VITAMIN B-12 CR PO) [id number] Yes Take 1,000 mcg by mouth daily.  [provider] Taking Active            Med Note Harlon Flor, AMMIE L   Tue Mar 09, 2014  9:51 AM) Received from: External Pharmacy  Evolocumab [facility] SURECLICK) 140 MG/ML Ivory Broad [id number] Yes Inject 140 mg into the skin every 14 (fourteen) days. Antonieta Iba, MD Taking Active  famotidine (PEPCID) 20 MG tablet [id number]  Take 20 mg by mouth 2 (two) times daily. [provider]  Active   Lancets (ONETOUCH DELICA PLUS [id number]) [place] [id number]  USE TO CHECK BLOOD GLUCOSE  ONCE DAILY AS DIRECTED [name], PA-C  Active   metFORMIN (GLUCOPHAGE-XR) 500 MG 24 hr tablet [id number] Yes Take 1 tablet (500 mg total) by mouth daily with breakfast. [name], PA-C Taking Active   mometasone (ELOCON) 0.1 % lotion [id number]  Apply topically. [provider]  Active   Omega-3 Fatty Acids (FISH OIL) 1200 MG CAPS [id number]  Take 1 capsule by mouth daily.  [provider]  Active   Roma Schanz test strip [id number]  CHECK FINGERSTICK BLOOD  SUGARS ONCE DAILY [name], PA-C  Active           Patient Active Problem List   Diagnosis Date Noted  . Aortic atherosclerosis ([place]) 04/27/2020  . 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 Left foot pain 01/10/2017  . Tinea pedis of both feet 01/10/2017  . Onychomycosis 01/10/2017  . Needs flu shot 01/10/2017  . Onychomycosis of multiple toenails with type 2 diabetes mellitus ([place]) 07/19/2016  . SOB (shortness of breath) 07/14/2016  . Carotid stenosis 03/13/2016  . 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 07/11/2015  . Medication monitoring encounter 05/04/2015  . Ganglion cyst 02/23/2015  . Joint pain in fingers of right hand 02/23/2015  . Hyperlipidemia, unspecified   . HTN (hypertension)    . Personal history of prostate cancer   . Carotid atherosclerosis   . Fatigue 08/20/2013  . GERD (gastroesophageal reflux disease) 08/20/2013    Immunization History  Administered Date(s) Administered  . Fluad Quad(high Dose 65+) 01/20/2019, 04/25/2020  . Influenza, High Dose Seasonal PF 01/10/2017, 01/13/2018  . Influenza-Unspecified 01/10/2012, 01/01/2013, 01/01/2015  . Janssen (J&J) SARS-COV-2 Vaccination 09/08/2019  . Pneumococcal Conjugate-13 07/30/2013  . Pneumococcal Polysaccharide-23 02/27/2011  . Td 03/04/2014  . Zoster 02/17/2014  . Zoster Recombinat (Shingrix) 07/01/2016, 01/31/2017    Conditions to be addressed/monitored:  Hypertension, Hyperlipidemia, Diabetes and Coronary Artery Disease  Care Plan : General Pharmacy (Adult)  Updates made by [name], RPH since 08/17/2020 12:00 AM    Problem: Hypertension, Hyperlipidemia, Diabetes and Coronary Artery Disease   Priority: High    Long-Range Goal: Patient-Specific Goal   Start Date: 05/05/2020  Expected End Date: 11/02/2020  This Visit's Progress: Not on track  Recent Progress: On track  Priority: High  Note:   Current Barriers:  . Unable to achieve control of Diabetes  . Does not adhere to prescribed medication regimen  Pharmacist Clinical Goal(s):  Marland Kitchen Over the next 90 days, patient will achieve control of Diabetes as evidenced by A1c less than 7% through collaboration with PharmD and provider.   Interventions: . 1:1 collaboration with [name], PA-C regarding development and update of comprehensive plan of care as evidenced by provider attestation and co-signature . Inter-disciplinary care team collaboration (see longitudinal plan of care) . Comprehensive medication review performed; medication list updated in electronic medical record  Hypertension (BP goal <140/90) -Controlled -Current treatment: . None -Medications previously tried: Amlodipine (reflux)   -Current home readings: 138/82,  125/87, 133/79  -Denies hypotensive/hypertensive symptoms -Educated on Daily salt intake goal < 2300 mg; -Counseled to monitor BP at home 1-2 times weekly, document, and provide log at future appointments -Counseled on diet and exercise extensively  Hyperlipidemia: (LDL goal < 70) -Uncontrolled -Current treatment: . Repatha 140 mg into the skin  every two weeks (Resumed taking in late February)  -Medications previously tried: Zetia  -Patient concerned that Repatha may be increasing his blood sugars as he has noticed his readings tend to be higher the 2-3 days after he administers the injection.  -Continue current medications   Diabetes (A1c goal <7%) -uncontrolled -Patient reports POC A1c of 7.5% during home nurse visit on 07/29/20 -Current medications: Marland Kitchen Metformin XR 500 mg daily  -Medications previously tried: NA  -Current home glucose readings  Fasting After Breakfast Before Supper  25-May 177    22-May 164  177  19-May 175    18-May  200   17-May 184    16-May  191   Average 175 196    -Denies hypoglycemic/hyperglycemic symptoms -Current meal patterns:  . breakfast: Eggs OR English Muffin + Peanut Butter + coffee   . lunch: [place] [employer] Chicken + Green beans + Mashed Potatoes +Green tea  . dinner: 2 Waffles + Sugar Free Syrup + coffee  . snacks: rarely snacks. Might have potato chips OR protein bars  . drinks: Water. Diet coke once daily  -Current exercise: Exercising less recently.  -Counseled on carbohydrate counseling; importance of routine exercise.  -Recommended increasing metformin to 500 mg twice daily -Recommend increasing activity. Patient set goal of at least 10 minutes of walking every other day and one day of higher-intensity activity (stationary bike, dumbbells)   Patient Goals/Self-Care Activities . Over the next 90 days, patient will:  - take medications as prescribed check glucose daily (Fasting) , document, and provide at future appointments check blood  pressure 1-2 times weekly , document, and provide at future appointments  Follow Up Plan: Telephone follow up appointment with care management team member scheduled for:  11/17/2020 at 9:00 AM      Medication Assistance: None required.  Patient affirms current coverage meets needs.  Patient's preferred pharmacy is:  [facility], [address], [address], [place] [address] Phone: [phone] Fax: [place], [place] [address] Phone: [phone] Fax: [phone]  Uses pill box? Yes Pt endorses 100% compliance  We discussed: Current pharmacy is preferred with insurance plan and patient is satisfied with pharmacy services Patient decided to: Continue current medication management strategy  Care Plan and Follow Up Patient Decision:  Patient agrees to Care Plan and Follow-up.  Plan: Telephone follow up appointment with care management team member scheduled for:  11/17/2020 at 9:00 AM  [facility] [phone]

## 2020-09-13 ENCOUNTER — Other Ambulatory Visit: Payer: Self-pay

## 2020-09-13 ENCOUNTER — Ambulatory Visit (INDEPENDENT_AMBULATORY_CARE_PROVIDER_SITE_OTHER): Payer: Medicare Other

## 2020-09-13 VITALS — BP 134/90 | HR 99 | Temp 98.1°F | Resp 16 | Ht 67.0 in | Wt 175.6 lb

## 2020-09-13 DIAGNOSIS — Z0000 Encounter for general adult medical examination without abnormal findings: Secondary | ICD-10-CM

## 2020-09-13 NOTE — Progress Notes (Signed)
Subjective:   Gerald Bauer is a 75 y.o. male who presents for Medicare Annual/Subsequent preventive examination.  Review of Systems     Cardiac Risk Factors include: advanced age (>83men, >68 women);diabetes mellitus;dyslipidemia;male gender     Objective:    Today's Vitals   09/13/20 1324  BP: 134/90  Pulse: 99  Resp: 16  Temp: 98.1 F (36.7 C)  SpO2: 97%  Weight: 175 lb 9.6 oz (79.7 kg)  Height: 5\' 7"  (1.702 m)   Body mass index is 27.5 kg/m.  Advanced Directives 09/13/2020 09/10/2019 09/09/2018 09/03/2017 01/10/2017 08/01/2016 07/11/2016  Does Patient Have a Medical Advance Directive? No No No No No No No  Does patient want to make changes to medical advance directive? - No - Patient declined - - - - -  Would patient like information on creating a medical advance directive? No - Patient declined - No - Patient declined Yes (MAU/Ambulatory/Procedural Areas - Information given) - No - Patient declined -    Current Medications (verified) Outpatient Encounter Medications as of 09/13/2020  Medication Sig   Evolocumab (REPATHA SURECLICK) 244 MG/ML SOAJ Inject 140 mg into the skin every 14 (fourteen) days.   Lancets (ONETOUCH DELICA PLUS [id number]) MISC USE TO CHECK BLOOD GLUCOSE  ONCE DAILY AS DIRECTED   metFORMIN (GLUCOPHAGE-XR) 500 MG 24 hr tablet Take 1 tablet (500 mg total) by mouth 2 (two) times daily with a meal.   ONETOUCH VERIO test strip CHECK FINGERSTICK BLOOD  SUGARS ONCE DAILY   cholecalciferol (VITAMIN D) 1000 units tablet Take 1 tablet (1,000 Units total) by mouth daily. (Patient not taking: Reported on 09/13/2020)   Cyanocobalamin (VITAMIN B-12 CR PO) Take 1,000 mcg by mouth daily.  (Patient not taking: Reported on 09/13/2020)   [DISCONTINUED] aspirin 81 MG tablet Take 81 mg by mouth daily.   [DISCONTINUED] famotidine (PEPCID) 20 MG tablet Take 20 mg by mouth 2 (two) times daily.   [DISCONTINUED] mometasone (ELOCON) 0.1 % lotion Apply topically.   [DISCONTINUED]  Omega-3 Fatty Acids (FISH OIL) 1200 MG CAPS Take 1 capsule by mouth daily.    Facility-Administered Encounter Medications as of 09/13/2020  Medication   betamethasone acetate-betamethasone sodium phosphate (CELESTONE) injection 3 mg    Allergies (verified) Amoxicillin, Metformin and related, Statins, and Welchol [colesevelam hcl]   History: Past Medical History:  Diagnosis Date   Acid reflux    Arthritis    hands   Carotid atherosclerosis    Diabetes mellitus without complication (HCC)    Hyperlipemia    Hypertension    Prostate cancer ([place])    history   Tachycardia    Past Surgical History:  Procedure Laterality Date   APPENDECTOMY  1973   CHOLECYSTECTOMY  2013   PROSTATECTOMY  2012   Family History  Problem Relation Age of Onset   Dementia Mother    High Cholesterol Mother    Hypertension Mother    Arthritis Mother    Hyperlipidemia Mother    Heart disease Father    Heart attack Father    Arthritis Maternal Grandmother    Cancer Maternal Grandmother        colon   Prostate cancer Brother    Cancer Brother        prostate   Cancer Maternal Uncle        colon   COPD Maternal Uncle    Stroke Paternal Grandmother    Diabetes Paternal Grandmother    Aneurysm Paternal Grandfather    Social History  Socioeconomic History   Marital status: Widowed    Spouse name: Regino Schultze   Number of children: 2   Years of education: some college   Highest education level: 12th grade  Occupational History   Occupation: Retired    Comment: [employer]  Tobacco Use   Smoking status: Former    Packs/day: 1.00    Years: 40.00    Pack years: 40.00    Types: Cigarettes    Quit date: 03/09/1997    Years since quitting: 23.5   Smokeless tobacco: Former   Tobacco comments:    smoking cessation materials not required  Vaping Use   Vaping Use: Never used  Substance and Sexual Activity   Alcohol use: No    Alcohol/week: 0.0 standard drinks   Drug use: No   Sexual activity:  Never  Other Topics Concern   Not on file  Social History Narrative   Pt lives alone   Social Determinants of Health   Financial Resource Strain: Low Risk    Difficulty of Paying Living Expenses: Not hard at all  Food Insecurity: No Food Insecurity   Worried About Charity fundraiser in the Last Year: Never true   Arboriculturist in the Last Year: Never true  Transportation Needs: No Transportation Needs   Lack of Transportation (Medical): No   Lack of Transportation (Non-Medical): No  Physical Activity: Sufficiently Active   Days of Exercise per Week: 7 days   Minutes of Exercise per Session: 30 min  Stress: No Stress Concern Present   Feeling of Stress : Not at all  Social Connections: Moderately Isolated   Frequency of Communication with Friends and Family: More than three times a week   Frequency of Social Gatherings with Friends and Family: Three times a week   Attends Religious Services: More than 4 times per year   Active Member of Clubs or Organizations: No   Attends Archivist Meetings: Never   Marital Status: Widowed    Tobacco Counseling Counseling given: Not Answered Tobacco comments: smoking cessation materials not required   Clinical Intake:  Pre-visit preparation completed: Yes  Pain : No/denies pain     BMI - recorded: 27.5 Nutritional Status: BMI 25 -29 Overweight Nutritional Risks: None Diabetes: Yes CBG done?: No Did pt. bring in CBG monitor from home?: No  How often do you need to have someone help you when you read instructions, pamphlets, or other written materials from your doctor or pharmacy?: 1 - Never  Nutrition Risk Assessment:  Has the patient had any N/V/D within the last 2 months?  No  Does the patient have any non-healing wounds?  No  Has the patient had any unintentional weight loss or weight gain?  No   Diabetes:  Is the patient diabetic?  Yes  If diabetic, was a CBG obtained today?  No  Did the patient bring in  their glucometer from home?  No  How often do you monitor your CBG's? 1-2 times daily.   Financial Strains and Diabetes Management:  Are you having any financial strains with the device, your supplies or your medication? No .  Does the patient want to be seen by Chronic Care Management for management of their diabetes?  No  Would the patient like to be referred to a Nutritionist or for Diabetic Management?  No   Diabetic Exams:  Diabetic Eye Exam: Completed 04/10/19 negative retinopathy; scheduled for appt in Nov 2022.   Diabetic Foot Exam: Completed  04/25/20.   Interpreter Needed?: No  Information entered by :: Clemetine Marker LPN   Activities of Daily Living In your present state of health, do you have any difficulty performing the following activities: 09/13/2020 07/21/2020  Hearing? Y N  Comment [facility] to obtain hearing aids -  Vision? N N  Difficulty concentrating or making decisions? N N  Walking or climbing stairs? N N  Dressing or bathing? N N  Doing errands, shopping? N N  Preparing Food and eating ? N -  Using the Toilet? N -  In the past six months, have you accidently leaked urine? Y -  Do you have problems with loss of bowel control? N -  Managing your Medications? N -  Managing your Finances? N -  Housekeeping or managing your Housekeeping? N -  Some recent data might be hidden    Patient Care Team: [name], PA-C as PCP - General (Family Medicine) [name], [facility] as Pharmacist (Pharmacist) [name], MD as Consulting Physician (Cardiology)  Indicate any recent Medical Services you may have received from other than [facility] providers in the past year (date may be approximate).     Assessment:   This is a routine wellness examination for [place].  Hearing/Vision screen Hearing Screening - Comments:: Pt c/o mild hearing difficulty; seen by ENT, needs hearing aids; working with [employer] to get them but unsure if he will qualify Vision Screening -  Comments:: [facility] screen[facility]; scheduled for Nov 2022  Dietary issues and exercise activities discussed: Current Exercise Habits: Home exercise routine, Type of exercise: walking, Time (Minutes): 30, Frequency (Times/Week): 7, Weekly Exercise (Minutes/Week): 210, Intensity: Mild, Exercise limited by: None identified   Goals Addressed   None    Depression Screen PHQ 2/9 Scores 09/13/2020 07/21/2020 04/25/2020 11/09/2019 09/10/2019 04/22/2019 01/20/2019  PHQ - 2 Score 0 0 0 0 0 0 0  PHQ- 9 Score - 0 0 0 - 1 0    Fall Risk Fall Risk  09/13/2020 07/21/2020 04/25/2020 11/09/2019 09/10/2019  Falls in the past year? 1 0 0 0 0  Comment - - - - -  Number falls in past yr: 0 0 0 0 0  Injury with Fall? 0 0 0 0 0  Comment - - - - -  Risk for fall due to : No Fall Risks - - - -  Follow up Falls prevention discussed - - Falls evaluation completed -    FALL RISK PREVENTION PERTAINING TO THE HOME:  Any stairs in or around the home? Yes  If so, are there any without handrails? No  Home free of loose throw rugs in walkways, pet beds, electrical cords, etc? Yes  Adequate lighting in your home to reduce risk of falls? Yes   ASSISTIVE DEVICES UTILIZED TO PREVENT FALLS:  Life alert? No  Use of a cane, walker or w/c? No  Grab bars in the bathroom? Yes  Shower chair or bench in shower? Yes  Elevated toilet seat or a handicapped toilet? Yes   TIMED UP AND GO:  Was the test performed? Yes .  Length of time to ambulate 10 feet: 5 sec.   Gait steady and fast without use of assistive device  Cognitive Function: Normal cognitive status assessed by direct observation by this Nurse Health Advisor. No abnormalities found.     MMSE - Mini Mental State Exam 03/09/2014  Orientation to time 5  Orientation to Place 4  Registration 3  Attention/ Calculation 5  Recall 2  Language- name 2 objects 2  Language- repeat 1  Language- follow 3 step command 3  Language- read &  follow direction 1  Write a sentence 1  Copy design 1  Total score 28     6CIT Screen 09/10/2019 09/09/2018 09/03/2017 07/11/2016  What Year? 0 points 0 points 0 points 0 points  What month? 0 points 0 points 0 points 0 points  What time? - 0 points 0 points 0 points  Count back from 20 0 points 0 points 0 points 0 points  Months in reverse 0 points 0 points 0 points 0 points  Repeat phrase 0 points 0 points 2 points 4 points  Total Score - 0 2 4    Immunizations Immunization History  Administered Date(s) Administered   Fluad Quad(high Dose 65+) 01/20/2019, 04/25/2020   Influenza, High Dose Seasonal PF 01/10/2017, 01/13/2018   Influenza-Unspecified 01/10/2012, 01/01/2013, 01/01/2015   Janssen (J&J) SARS-COV-2 Vaccination 09/08/2019   Pneumococcal Conjugate-13 07/30/2013   Pneumococcal Polysaccharide-23 02/27/2011   Td 03/04/2014   Zoster Recombinat (Shingrix) 07/01/2016, 01/31/2017   Zoster, Live 02/17/2014    TDAP status: Up to date  Flu Vaccine status: Up to date  Pneumococcal vaccine status: Up to date  Covid-19 vaccine status: Completed vaccines. Due for booster.   Qualifies for Shingles Vaccine? Yes   Zostavax completed Yes   Shingrix Completed?: Yes  Screening Tests Health Maintenance  Topic Date Due   COVID-19 Vaccine (2 - Janssen risk series) 10/06/2019   OPHTHALMOLOGY EXAM  04/09/2020   COLONOSCOPY (Pts 45-3yrs Insurance coverage will need to be confirmed)  04/25/2021 (Originally 10/01/2018)   HEMOGLOBIN A1C  10/23/2020   INFLUENZA VACCINE  10/24/2020   FOOT EXAM  04/25/2021   URINE MICROALBUMIN  04/25/2021   TETANUS/TDAP  03/04/2024   Hepatitis C Screening  Completed   PNA vac Low Risk Adult  Completed   Zoster Vaccines- Shingrix  Completed   HPV VACCINES  Aged Out    Health Maintenance  Health Maintenance Due  Topic Date Due   COVID-19 Vaccine (2 - Janssen risk series) 10/06/2019   OPHTHALMOLOGY EXAM  04/09/2020    Colorectal cancer screening:  Type of screening: Colonoscopy. Completed 09/30/13. Repeat every 5 years. Pt declines repeat screening at this time.   Lung Cancer Screening: (Low Dose CT Chest recommended if Age 30-80 years, 30 pack-year currently smoking OR have quit w/in 15years.) does not qualify.   Additional Screening:  Hepatitis C Screening: does qualify; Completed 05/09/15  Vision Screening: Recommended annual ophthalmology exams for early detection of glaucoma and other disorders of the eye. Is the patient up to date with their annual eye exam?  No  Who is the provider or what is the name of the office in which the patient attends annual eye exams? [facility].   Dental Screening: Recommended annual dental exams for proper oral hygiene  Community Resource Referral / Chronic Care Management: CRR required this visit?  No   CCM required this visit?  No      Plan:     I have personally reviewed and noted the following in the patient's chart:   Medical and social history Use of alcohol, tobacco or illicit drugs  Current medications and supplements including opioid prescriptions. Patient is not currently taking opioid prescriptions. Functional ability and status Nutritional status Physical activity Advanced directives List of other physicians Hospitalizations, surgeries, and ER visits in previous 12 months Vitals  Screenings to include cognitive, depression, and falls Referrals and appointments  In addition, I have reviewed and discussed with patient certain preventive protocols, quality metrics, and best practice recommendations. A written personalized care plan for preventive services as well as general preventive health recommendations were provided to patient.     Clemetine Marker, LPN   12/03/2888   Nurse Notes: pt aware to return to office for follow up labs; plans to have fasting labs first week of July in order for insurance to pay for them.   Handouts provided for healthy eating, diabetic  foot care and hypoglycemia to assist with diabetes management to lower A1c.   Pt is not taking vitamin B12 or vitamin D at this time but interested in lab work to see if these are necessary. Pt advised to discuss with PCP.   Pt states he completed FOBT card through [place] provided by [employer] but no record in chart. He would like to discuss Cologuard at next visit also. Scheduled for 6 mo f/u 01/23/21.

## 2020-09-13 NOTE — Patient Instructions (Signed)
[name] , Thank you for taking time to come for your Medicare Wellness Visit. I appreciate your ongoing commitment to your health goals. Please review the following plan we discussed and let me know if I can assist you in the future.   Screening recommendations/referrals: Colonoscopy: done 09/30/13. Due for repeat screening colonoscopy.  Recommended yearly ophthalmology/optometry visit for glaucoma screening and checkup Recommended yearly dental visit for hygiene and checkup  Vaccinations: Influenza vaccine: done 04/25/20 Pneumococcal vaccine: done 07/30/13 Tdap vaccine: done 03/04/14 Shingles vaccine: done 07/01/16 & 01/31/17   Covid-19: done 09/08/19  Advanced directives: Please bring a copy of your health care power of attorney and living will to the office at your convenience once you have completed those documents.   Conditions/risks identified: Recommend healthy eating and physical activity to lower A1c  Next appointment: Follow up in one year for your annual wellness visit.   Preventive Care 76 Years and Older, Male Preventive care refers to lifestyle choices and visits with your health care provider that can promote health and wellness. What does preventive care include? A yearly physical exam. This is also called an annual well check. Dental exams once or twice a year. Routine eye exams. Ask your health care provider how often you should have your eyes checked. Personal lifestyle choices, including: Daily care of your teeth and gums. Regular physical activity. Eating a healthy diet. Avoiding tobacco and drug use. Limiting alcohol use. Practicing safe sex. Taking low doses of aspirin every day. Taking vitamin and mineral supplements as recommended by your health care provider. What happens during an annual well check? The services and screenings done by your health care provider during your annual well check will depend on your age, overall health, lifestyle risk factors, and  family history of disease. Counseling  Your health care provider may ask you questions about your: Alcohol use. Tobacco use. Drug use. Emotional well-being. Home and relationship well-being. Sexual activity. Eating habits. History of falls. Memory and ability to understand (cognition). Work and work Statistician. Screening  You may have the following tests or measurements: Height, weight, and BMI. Blood pressure. Lipid and cholesterol levels. These may be checked every 5 years, or more frequently if you are over 60 years old. Skin check. Lung cancer screening. You may have this screening every year starting at age 30 if you have a 30-pack-year history of smoking and currently smoke or have quit within the past 15 years. Fecal occult blood test (FOBT) of the stool. You may have this test every year starting at age 92. Flexible sigmoidoscopy or colonoscopy. You may have a sigmoidoscopy every 5 years or a colonoscopy every 10 years starting at age 68. Prostate cancer screening. Recommendations will vary depending on your family history and other risks. Hepatitis C blood test. Hepatitis B blood test. Sexually transmitted disease (STD) testing. Diabetes screening. This is done by checking your blood sugar (glucose) after you have not eaten for a while (fasting). You may have this done every 1-3 years. Abdominal aortic aneurysm (AAA) screening. You may need this if you are a current or former smoker. Osteoporosis. You may be screened starting at age 38 if you are at high risk. Talk with your health care provider about your test results, treatment options, and if necessary, the need for more tests. Vaccines  Your health care provider may recommend certain vaccines, such as: Influenza vaccine. This is recommended every year. Tetanus, diphtheria, and acellular pertussis (Tdap, Td) vaccine. You may need a Td  booster every 10 years. Zoster vaccine. You may need this after age 52. Pneumococcal  13-valent conjugate (PCV13) vaccine. One dose is recommended after age 38. Pneumococcal polysaccharide (PPSV23) vaccine. One dose is recommended after age 38. Talk to your health care provider about which screenings and vaccines you need and how often you need them. This information is not intended to replace advice given to you by your health care provider. Make sure you discuss any questions you have with your health care provider. Document Released: 04/08/2015 Document Revised: 11/30/2015 Document Reviewed: 01/11/2015 Elsevier Interactive Patient Education  2017 [employer] Prevention in the Home Falls can cause injuries. They can happen to people of all ages. There are many things you can do to make your home safe and to help prevent falls. What can I do on the outside of my home? Regularly fix the edges of walkways and driveways and fix any cracks. Remove anything that might make you trip as you walk through a door, such as a raised step or threshold. Trim any bushes or trees on the path to your home. Use bright outdoor lighting. Clear any walking paths of anything that might make someone trip, such as rocks or tools. Regularly check to see if handrails are loose or broken. Make sure that both sides of any steps have handrails. Any raised decks and porches should have guardrails on the edges. Have any leaves, snow, or ice cleared regularly. Use sand or salt on walking paths during winter. Clean up any spills in your garage right away. This includes oil or grease spills. What can I do in the bathroom? Use night lights. Install grab bars by the toilet and in the tub and shower. Do not use towel bars as grab bars. Use non-skid mats or decals in the tub or shower. If you need to sit down in the shower, use a plastic, non-slip stool. Keep the floor dry. Clean up any water that spills on the floor as soon as it happens. Remove soap buildup in the tub or shower regularly. Attach  bath mats securely with double-sided non-slip rug tape. Do not have throw rugs and other things on the floor that can make you trip. What can I do in the bedroom? Use night lights. Make sure that you have a light by your bed that is easy to reach. Do not use any sheets or blankets that are too big for your bed. They should not hang down onto the floor. Have a firm chair that has side arms. You can use this for support while you get dressed. Do not have throw rugs and other things on the floor that can make you trip. What can I do in the kitchen? Clean up any spills right away. Avoid walking on wet floors. Keep items that you use a lot in easy-to-reach places. If you need to reach something above you, use a strong step stool that has a grab bar. Keep electrical cords out of the way. Do not use floor polish or wax that makes floors slippery. If you must use wax, use non-skid floor wax. Do not have throw rugs and other things on the floor that can make you trip. What can I do with my stairs? Do not leave any items on the stairs. Make sure that there are handrails on both sides of the stairs and use them. Fix handrails that are broken or loose. Make sure that handrails are as long as the stairways. Check any carpeting  to make sure that it is firmly attached to the stairs. Fix any carpet that is loose or worn. Avoid having throw rugs at the top or bottom of the stairs. If you do have throw rugs, attach them to the floor with carpet tape. Make sure that you have a light switch at the top of the stairs and the bottom of the stairs. If you do not have them, ask someone to add them for you. What else can I do to help prevent falls? Wear shoes that: Do not have high heels. Have rubber bottoms. Are comfortable and fit you well. Are closed at the toe. Do not wear sandals. If you use a stepladder: Make sure that it is fully opened. Do not climb a closed stepladder. Make sure that both sides of the  stepladder are locked into place. Ask someone to hold it for you, if possible. Clearly mark and make sure that you can see: Any grab bars or handrails. First and last steps. Where the edge of each step is. Use tools that help you move around (mobility aids) if they are needed. These include: Canes. Walkers. Scooters. Crutches. Turn on the lights when you go into a dark area. Replace any light bulbs as soon as they burn out. Set up your furniture so you have a clear path. Avoid moving your furniture around. If any of your floors are uneven, fix them. If there are any pets around you, be aware of where they are. Review your medicines with your doctor. Some medicines can make you feel dizzy. This can increase your chance of falling. Ask your doctor what other things that you can do to help prevent falls. This information is not intended to replace advice given to you by your health care provider. Make sure you discuss any questions you have with your health care provider. Document Released: 01/06/2009 Document Revised: 08/18/2015 Document Reviewed: 04/16/2014 Elsevier Interactive Patient Education  2017 [employer].

## 2020-09-15 ENCOUNTER — Other Ambulatory Visit: Payer: Self-pay | Admitting: Family Medicine

## 2020-09-15 DIAGNOSIS — E1165 Type 2 diabetes mellitus with hyperglycemia: Secondary | ICD-10-CM

## 2020-10-05 LAB — COMPLETE METABOLIC PANEL WITH GFR
AG Ratio: 1.7 (calc) (ref 1.0–2.5)
ALT: 17 U/L (ref 9–46)
AST: 13 U/L (ref 10–35)
Albumin: 4.2 g/dL (ref 3.6–5.1)
Alkaline phosphatase (APISO): 64 U/L (ref 35–144)
BUN: 14 mg/dL (ref 7–25)
CO2: 27 mmol/L (ref 20–32)
Calcium: 9.2 mg/dL (ref 8.6–10.3)
Chloride: 103 mmol/L (ref 98–110)
Creat: 0.87 mg/dL (ref 0.70–1.28)
Globulin: 2.5 g/dL (calc) (ref 1.9–3.7)
Glucose, Bld: 148 mg/dL — ABNORMAL HIGH (ref 65–99)
Potassium: 4.6 mmol/L (ref 3.5–5.3)
Sodium: 139 mmol/L (ref 135–146)
Total Bilirubin: 0.8 mg/dL (ref 0.2–1.2)
Total Protein: 6.7 g/dL (ref 6.1–8.1)
eGFR: 89 mL/min/{1.73_m2} (ref >=60)

## 2020-10-05 LAB — LIPID PANEL
Cholesterol: 209 mg/dL — ABNORMAL HIGH (ref <200)
HDL: 49 mg/dL (ref >=40)
LDL Cholesterol (Calc): 133 mg/dL (calc) — ABNORMAL HIGH
Non-HDL Cholesterol (Calc): 160 mg/dL (calc) — ABNORMAL HIGH (ref <130)
Total CHOL/HDL Ratio: 4.3 (calc) (ref <5.0)
Triglycerides: 147 mg/dL (ref <150)

## 2020-10-05 LAB — HEMOGLOBIN A1C
Hgb A1c MFr Bld: 7.3 % of total Hgb — ABNORMAL HIGH (ref <5.7)
Mean Plasma Glucose: 163 mg/dL
eAG (mmol/L): 9 mmol/L

## 2020-10-11 ENCOUNTER — Other Ambulatory Visit: Payer: Self-pay | Admitting: Family Medicine

## 2020-10-11 ENCOUNTER — Other Ambulatory Visit: Payer: Self-pay

## 2020-10-11 DIAGNOSIS — E119 Type 2 diabetes mellitus without complications: Secondary | ICD-10-CM

## 2020-10-18 ENCOUNTER — Telehealth: Payer: Self-pay

## 2020-10-18 NOTE — Progress Notes (Signed)
    Chronic Care Management Pharmacy Assistant   Name: Gerald Bauer  MRN: [id number] DOB: 1945-03-29  Reason for Encounter:  Diabetes Disease State Call   Recent office visits:  No recent office visits  Recent consult visits:  No recent consult visits   Hospital visits:  None in previous 6 months  Medications: Outpatient Encounter Medications as of 10/18/2020  Medication Sig   cholecalciferol (VITAMIN D) 1000 units tablet Take 1 tablet (1,000 Units total) by mouth daily. (Patient not taking: Reported on 09/13/2020)   Cyanocobalamin (VITAMIN B-12 CR PO) Take 1,000 mcg by mouth daily.  (Patient not taking: Reported on 09/13/2020)   Evolocumab (REPATHA SURECLICK) [id number] MG/ML SOAJ Inject 140 mg into the skin every 14 (fourteen) days.   Lancets (ONETOUCH DELICA PLUS [id number]) MISC USE TO CHECK BLOOD GLUCOSE  ONCE DAILY AS DIRECTED   metFORMIN (GLUCOPHAGE-XR) 500 MG 24 hr tablet TAKE 1 TABLET BY MOUTH  TWICE DAILY WITH A MEAL   ONETOUCH VERIO test strip CHECK FINGERSTICK BLOOD  SUGARS ONCE DAILY   Facility-Administered Encounter Medications as of 10/18/2020  Medication   betamethasone acetate-betamethasone sodium phosphate (CELESTONE) injection 3 mg   Care Gaps: COVID-19 Vaccine OPHTHALMOLOGY EXAM  Star Rating Drugs: Metformin 500 mg last filled on 08/17/2020 for a 90-Day supply with [facility]  Recent Relevant Labs: Lab Results  Component Value Date/Time   HGBA1C 7.3 (H) 10/04/2020 08:35 AM   HGBA1C 8.0 (H) 04/25/2020 03:57 PM   HGBA1C 8.8 10/21/2018 12:00 AM   MICROALBUR 0.4 04/25/2020 03:57 PM   MICROALBUR 20 01/20/2019 09:16 AM   MICROALBUR 0.4 01/10/2017 09:13 AM    Kidney Function Lab Results  Component Value Date/Time   CREATININE 0.87 10/04/2020 08:35 AM   CREATININE 0.85 04/25/2020 03:57 PM   GFRNONAA 85 04/25/2020 03:57 PM   GFRAA 99 04/25/2020 03:57 PM    Current antihyperglycemic regimen:  Metformin 500 mg 1 tablet twice daily  What recent  interventions/DTPs have been made to improve glycemic control:  Metformin 500 mg increased to twice daily  Have there been any recent hospitalizations or ED visits since last visit with CPP? No  Adherence Review: Is the patient currently on a STATIN medication? No Is the patient currently on ACE/ARB medication? No Does the patient have >5 day gap between last estimated fill dates? No   Patient has a follow-up appointment with [name], CPP on 11/17/2020 @ 0900 09/13/2020- AWV Completed  07/26 LVM requesting patient to return my call 07/28 LVM requesting patient to return my call 07/29 LVM requesting patient to return my call  I have attempted without success to contact this patient by phone three times to complete his monthly disease state call. I have left voice messages requesting this patient to return my call.   Lynann Bologna, CPA/CMA Clinical Pharmacist Assistant Phone: [phone]

## 2020-11-16 ENCOUNTER — Telehealth: Payer: Self-pay

## 2020-11-16 NOTE — Progress Notes (Signed)
   Chronic Care Management Pharmacy Assistant   Name: Gerald Bauer  MRN: [id number] DOB: Dec 13, 1945  Patient called to be reminded of his appointment with [name], CPP on 11/17/2020 @ 0900 via telephone.   No answer, left message of appointment date, time and type of appointment (either telephone or in person). Left message to have all medications, supplements, blood pressure and/or blood sugar logs available during appointment and to return call if need to reschedule.  Star Rating Drug: Metformin 500 mg last filled on 11/10/2020 for a 90-Day supply with [facility]   Any gaps in medications fill history? No  Care Gaps: COVID-19 Vaccine Booster 2 Influenza Vaccine (last completed 04/25/2020) Opthalmology Exam  Lynann Bologna, CPA/CMA Clinical Pharmacist Assistant Phone: [phone]

## 2020-11-17 ENCOUNTER — Ambulatory Visit (INDEPENDENT_AMBULATORY_CARE_PROVIDER_SITE_OTHER): Payer: Medicare Other

## 2020-11-17 DIAGNOSIS — E1159 Type 2 diabetes mellitus with other circulatory complications: Secondary | ICD-10-CM | POA: Diagnosis not present

## 2020-11-17 DIAGNOSIS — I152 Hypertension secondary to endocrine disorders: Secondary | ICD-10-CM

## 2020-11-17 DIAGNOSIS — E119 Type 2 diabetes mellitus without complications: Secondary | ICD-10-CM

## 2020-11-17 NOTE — Progress Notes (Signed)
Chronic Care Management Pharmacy Note  11/17/2020 Name:  Gerald Bauer MRN:  [id number] DOB:  [birth year]  Summary: Patient presents for CCM follow-up. His A1c improved at last check which he was happy about. He has been working on his diet and exercise. He recently caught Covid, and is just getting over his symptoms.   Recommendations/Changes made from today's visit: Continue current medications  Plan: CPP follow-up in 6 months  Subjective: Gerald Bauer is an 75 y.o. year old male who is a primary patient of [name], [place].  The CCM team was consulted for assistance with disease management and care coordination needs.    Engaged with patient by telephone for follow up visit in response to provider referral for pharmacy case management and/or care coordination services.   Consent to Services:  The patient was given information about Chronic Care Management services, agreed to services, and gave verbal consent prior to initiation of services.  Please see initial visit note for detailed documentation.   Patient Care Team: [name], PA-C as PCP - General (Family Medicine) [name], [facility] as Pharmacist (Pharmacist) [name], MD as Consulting Physician (Cardiology)  Recent office visits: 07/21/20: Patient presented to [name], PA-C for follow-up. No medication changes made.  04/25/2020: Patient presented to [name], PA-C for follow-up. A1c worsened to 8%. Patient started on metformin. Patient stopped amlodipine, famotidine. LDL worsened in setting of non-adherence to Repatha.   Recent consult visits: None in previous 6 months  Hospital visits: None in previous 6 months  Objective:  Lab Results  Component Value Date   CREATININE 0.87 10/04/2020   BUN 14 10/04/2020   GFRNONAA 85 04/25/2020   GFRAA 99 04/25/2020   NA 139 10/04/2020   K 4.6 10/04/2020   CALCIUM 9.2 10/04/2020   CO2 27 10/04/2020    Lab Results  Component Value  Date/Time   HGBA1C 7.3 (H) 10/04/2020 08:35 AM   HGBA1C 8.0 (H) 04/25/2020 03:57 PM   HGBA1C 8.8 10/21/2018 12:00 AM   MICROALBUR 0.4 04/25/2020 03:57 PM   MICROALBUR 20 01/20/2019 09:16 AM   MICROALBUR 0.4 01/10/2017 09:13 AM    Last diabetic Eye exam:  Lab Results  Component Value Date/Time   HMDIABEYEEXA No Retinopathy 04/10/2019 12:00 AM    Last diabetic Foot exam: No results found for: HMDIABFOOTEX   Lab Results  Component Value Date   CHOL 209 (H) 10/04/2020   HDL 49 10/04/2020   LDLCALC 133 (H) 10/04/2020   TRIG 147 10/04/2020   CHOLHDL 4.3 10/04/2020    Hepatic Function Latest Ref Rng & Units 10/04/2020 04/25/2020 11/09/2019  Total Protein 6.1 - 8.1 g/dL 6.7 7.1 6.8  Albumin 3.7 - 4.7 g/dL - - -  AST 10 - 35 U/L 13 84(H) 16  ALT 9 - 46 U/L 17 190(H) 19  Alk Phosphatase 48 - 121 IU/L - - -  Total Bilirubin 0.2 - 1.2 mg/dL 0.8 0.5 0.6  Bilirubin, Direct 0.00 - 0.40 mg/dL - - -    Lab Results  Component Value Date/Time   TSH 2.11 01/10/2017 09:13 AM    CBC Latest Ref Rng & Units 04/25/2020 11/09/2019 01/20/2019  WBC 3.8 - 10.8 Thousand/uL 8.2 7.6 7.4  Hemoglobin 13.2 - 17.1 g/dL 16.1 15.6 15.1  Hematocrit 38.5 - 50.0 % 46.6 45.5 43.5  Platelets 140 - 400 Thousand/uL 281 247 249    Lab Results  Component Value Date/Time   VD25OH 38.3 01/13/2018 09:41 AM  VD25OH 31 01/10/2017 09:13 AM    Clinical ASCVD: Yes  The 10-year ASCVD risk score Denman George DC Jr., et al., 2013) is: 47.8%   Values used to calculate the score:     Age: 75 years     Sex: Male     Is Non-Hispanic African American: No     Diabetic: Yes     Tobacco smoker: No     Systolic Blood Pressure: 134 mmHg     Is BP treated: No     HDL Cholesterol: 49 mg/dL     Total Cholesterol: 209 mg/dL    Depression screen [facility] 2/9 09/13/2020 07/21/2020 04/25/2020  Decreased Interest 0 0 0  Down, Depressed, Hopeless 0 0 0  PHQ - 2 Score 0 0 0  Altered sleeping - 0 0  Tired, decreased energy - 0 0  Change in  appetite - 0 0  Feeling bad or failure about yourself  - 0 0  Trouble concentrating - 0 0  Moving slowly or fidgety/restless - 0 0  Suicidal thoughts - 0 0  PHQ-9 Score - 0 0  Difficult doing work/chores - Not difficult at all Not difficult at all  Some recent data might be hidden    Social History   Tobacco Use  Smoking Status Former   Packs/day: 1.00   Years: 40.00   Pack years: 40.00   Types: Cigarettes   Quit date: 03/09/1997   Years since quitting: 23.7  Smokeless Tobacco Former  Tobacco Comments   smoking cessation materials not required   BP Readings from Last 3 Encounters:  09/13/20 134/90  07/21/20 128/80  04/25/20 134/86   Pulse Readings from Last 3 Encounters:  09/13/20 99  07/21/20 98  04/25/20 (!) 120   Wt Readings from Last 3 Encounters:  09/13/20 175 lb 9.6 oz (79.7 kg)  07/21/20 174 lb 12.8 oz (79.3 kg)  04/25/20 170 lb (77.1 kg)    Assessment/Interventions: Review of patient past medical history, allergies, medications, health status, including review of consultants reports, laboratory and other test data, was performed as part of comprehensive evaluation and provision of chronic care management services.   SDOH:  (Social Determinants of Health) assessments and interventions performed: Yes SDOH Interventions    Flowsheet Row Most Recent Value  SDOH Interventions   Financial Strain Interventions Intervention Not Indicated        CCM Care Plan  Allergies  Allergen Reactions   Amoxicillin Swelling   Metformin And Related Other (See Comments)    Abdominal pains   Statins Other (See Comments)    Affects the liver   Welchol [Colesevelam Hcl] Other (See Comments)    affects the liver    Medications Reviewed Today     Reviewed by Reather Littler, LPN (Licensed Practical Nurse) on 09/13/20 at 1333  Med List Status: <None>   Medication Order Taking? Sig Documenting Provider Last Dose Status Informant  betamethasone acetate-betamethasone sodium  phosphate (CELESTONE) injection 3 mg [id number]   Felecia Shelling, DPM  Active   cholecalciferol (VITAMIN D) 1000 units tablet [id number]  Take 1 tablet (1,000 Units total) by mouth daily. Kerman Passey, MD  Active   Cyanocobalamin (VITAMIN B-12 CR PO) [id number] No Take 1,000 mcg by mouth daily.   Patient not taking: Reported on 09/13/2020   [provider] Not Taking Active            Med Note Reather Littler D   Tue Sep 13, 2020  1:32 PM)  Evolocumab (REPATHA SURECLICK) 607 MG/ML SOAJ [id number] Yes Inject 140 mg into the skin every 14 (fourteen) days. [name], MD Taking Active     Discontinued 09/13/20 1333 (Error)   Lancets (ONETOUCH DELICA PLUS [id number]) [place] [id number]  USE TO CHECK BLOOD GLUCOSE  ONCE DAILY AS DIRECTED [name], PA-C  Active   metFORMIN (GLUCOPHAGE-XR) 500 MG 24 hr tablet [id number] Yes Take 1 tablet (500 mg total) by mouth 2 (two) times daily with a meal. [name], PA-C Taking Active   [facility] VERIO test strip [id number]  CHECK FINGERSTICK BLOOD  SUGARS ONCE DAILY [name], PA-C  Active             Patient Active Problem List   Diagnosis Date Noted   Aortic atherosclerosis ([place]) 04/27/2020   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   Left foot pain 01/10/2017   Tinea pedis of both feet 01/10/2017   Onychomycosis 01/10/2017   Needs flu shot 01/10/2017   Onychomycosis of multiple toenails with type 2 diabetes mellitus ([place]) 07/19/2016   SOB (shortness of breath) 07/14/2016   Carotid stenosis 03/13/2016   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 07/11/2015   Medication monitoring encounter 05/04/2015   Ganglion cyst 02/23/2015   Joint pain in fingers of right hand 02/23/2015   Hyperlipidemia, unspecified    HTN (hypertension)    Personal history of prostate cancer    Carotid atherosclerosis    Fatigue 08/20/2013   GERD (gastroesophageal reflux disease) 08/20/2013    Immunization History  Administered Date(s) Administered   Fluad  Quad(high Dose 65+) 01/20/2019, 04/25/2020   Influenza, High Dose Seasonal PF 01/10/2017, 01/13/2018   Influenza-Unspecified 01/10/2012, 01/01/2013, 01/01/2015   Janssen (J&J) SARS-COV-2 Vaccination 09/08/2019   Pneumococcal Conjugate-13 07/30/2013   Pneumococcal Polysaccharide-23 02/27/2011   Td 03/04/2014   Zoster Recombinat (Shingrix) 07/01/2016, 01/31/2017   Zoster, Live 02/17/2014    Conditions to be addressed/monitored:  Hypertension, Hyperlipidemia, Diabetes and Coronary Artery Disease  Care Plan : General Pharmacy (Adult)  Updates made by [name], RPH since 11/17/2020 12:00 AM     Problem: Hypertension, Hyperlipidemia, Diabetes and Coronary Artery Disease   Priority: High     Long-Range Goal: Patient-Specific Goal   Start Date: 05/05/2020  Expected End Date: 11/17/2021  This Visit's Progress: On track  Recent Progress: Not on track  Priority: High  Note:   Current Barriers:  Unable to achieve control of Diabetes  Does not adhere to prescribed medication regimen  Pharmacist Clinical Goal(s):  Over the next 90 days, patient will achieve control of Diabetes as evidenced by A1c less than 7% through collaboration with PharmD and provider.   Interventions: 1:1 collaboration with [name], PA-C regarding development and update of comprehensive plan of care as evidenced by provider attestation and co-signature Inter-disciplinary care team collaboration (see longitudinal plan of care) Comprehensive medication review performed; medication list updated in electronic medical record  Hypertension (BP goal <140/90) -Controlled -Current treatment: None -Medications previously tried: Amlodipine (reflux)   -Current home readings: 127/90  -Denies hypotensive/hypertensive symptoms -Educated on Daily salt intake goal < 2300 mg; -Counseled to monitor BP at home 1-2 times weekly, document, and provide log at future appointments -Counseled on diet and exercise  extensively  Hyperlipidemia: (LDL goal < 70) -Uncontrolled -Current treatment: Repatha 140 mg into the skin every two weeks (Resumed taking in late February)  -Medications previously tried: Zetia  -Patient concerned that Repatha may be increasing his blood sugars as he has noticed his readings  tend to be higher the 2-3 days after he administers the injection.  -Continue current medications   Diabetes (A1c goal <7%) -Uncontrolled -Current medications: Metformin XR 500 mg twice daily  -Medications previously tried: NA  -Current home glucose readings  Fasting Afternoon  25-Aug 177   24-Aug  159  16-Aug 148   14-Aug  172  4-Aug  124  3-Aug 130   Average 152 152  -Denies hypoglycemic/hyperglycemic symptoms -Current meal patterns: Snacking on more apples. Protein bars. Meat + two vegetables. Trying to limit unhealthy snack like potatoes and ice cream, but does indulge from time to time.   -Current exercise: Walking 30 minutes daily -Continue current medications  Patient Goals/Self-Care Activities Over the next 90 days, patient will:  - take medications as prescribed check glucose daily (Fasting) , document, and provide at future appointments check blood pressure 1-2 times weekly , document, and provide at future appointments  Follow Up Plan: Telephone follow up appointment with care management team member scheduled for:  05/24/2021 at 8:30 AM       Medication Assistance: None required.  Patient affirms current coverage meets needs.  Patient's preferred pharmacy is:  [employer]  ([place], [place] [facility] [place] [address] [place] [address] Phone: [phone] Fax: [place], [place] [address] Phone: [phone] Fax: [phone]  Uses pill box? Yes Pt endorses 100% compliance  We discussed: Current pharmacy is preferred with insurance plan and  patient is satisfied with pharmacy services Patient decided to: Continue current medication management strategy  Care Plan and Follow Up Patient Decision:  Patient agrees to Care Plan and Follow-up.  Plan: Telephone follow up appointment with care management team member scheduled for:  05/24/2021 at 8:30 AM  [facility] [phone]

## 2020-11-17 NOTE — Patient Instructions (Signed)
Visit Information It was great speaking with you today!  Please let me know if you have any questions about our visit.   Goals Addressed             This Visit's Progress    Monitor and Manage My Blood Sugar-Diabetes Type 2   On track    Timeframe:  Long-Range Goal Priority:  High Start Date:    05/05/2020                         Expected End Date:   03/19/2022                    Follow Up Date 01/22/2021     - check blood sugar daily before breakfast - check blood sugar if I feel it is too high or too low    Why is this important?   Checking your blood sugar at home helps to keep it from getting very high or very low.  Writing the results in a diary or log helps the doctor know how to care for you.  Your blood sugar log should have the time, date and the results.  Also, write down the amount of insulin or other medicine that you take.  Other information, like what you ate, exercise done and how you were feeling, will also be helpful.     Notes:         Patient Care Plan: General Pharmacy (Adult)     Problem Identified: Hypertension, Hyperlipidemia, Diabetes and Coronary Artery Disease   Priority: High     Long-Range Goal: Patient-Specific Goal   Start Date: 05/05/2020  Expected End Date: 11/17/2021  This Visit's Progress: On track  Recent Progress: Not on track  Priority: High  Note:   Current Barriers:  Unable to achieve control of Diabetes  Does not adhere to prescribed medication regimen  Pharmacist Clinical Goal(s):  Over the next 90 days, patient will achieve control of Diabetes as evidenced by A1c less than 7% through collaboration with PharmD and provider.   Interventions: 1:1 collaboration with [name], PA-C regarding development and update of comprehensive plan of care as evidenced by provider attestation and co-signature Inter-disciplinary care team collaboration (see longitudinal plan of care) Comprehensive medication review performed;  medication list updated in electronic medical record  Hypertension (BP goal <140/90) -Controlled -Current treatment: None -Medications previously tried: Amlodipine (reflux)   -Current home readings: 127/90  -Denies hypotensive/hypertensive symptoms -Educated on Daily salt intake goal < 2300 mg; -Counseled to monitor BP at home 1-2 times weekly, document, and provide log at future appointments -Counseled on diet and exercise extensively  Hyperlipidemia: (LDL goal < 70) -Uncontrolled -Current treatment: Repatha 140 mg into the skin every two weeks (Resumed taking in late February)  -Medications previously tried: Zetia  -Patient concerned that Repatha may be increasing his blood sugars as he has noticed his readings tend to be higher the 2-3 days after he administers the injection.  -Continue current medications   Diabetes (A1c goal <7%) -Uncontrolled -Current medications: Metformin XR 500 mg twice daily  -Medications previously tried: NA  -Current home glucose readings  Fasting Afternoon  25-Aug 177   24-Aug  159  16-Aug 148   14-Aug  172  4-Aug  124  3-Aug 130   Average 152 152  -Denies hypoglycemic/hyperglycemic symptoms -Current meal patterns: Snacking on more apples. Protein bars. Meat + two vegetables. Trying to limit unhealthy snack like potatoes  and ice cream, but does indulge from time to time.   -Current exercise: Walking 30 minutes daily -Continue current medications  Patient Goals/Self-Care Activities Over the next 90 days, patient will:  - take medications as prescribed check glucose daily (Fasting) , document, and provide at future appointments check blood pressure 1-2 times weekly , document, and provide at future appointments  Follow Up Plan: Telephone follow up appointment with care management team member scheduled for:  05/24/2021 at 8:30 AM    Patient agreed to services and verbal consent obtained.   Patient verbalizes understanding of instructions  provided today and agrees to view in [place].   Malva Limes, [facility] [phone]

## 2020-12-27 ENCOUNTER — Telehealth: Payer: Self-pay

## 2020-12-27 NOTE — Progress Notes (Signed)
    Chronic Care Management Pharmacy Assistant   Name: Kavi Almquist  MRN: [id number] DOB: 1946-03-21  Reason for Encounter: Diabetic Disease State Call   Recent office visits:  None ID  Recent consult visits:  None ID  Hospital visits:  None in previous 6 months  Medications: Outpatient Encounter Medications as of 12/27/2020  Medication Sig   cholecalciferol (VITAMIN D) 1000 units tablet Take 1 tablet (1,000 Units total) by mouth daily. (Patient not taking: Reported on 09/13/2020)   Cyanocobalamin (VITAMIN B-12 CR PO) Take 1,000 mcg by mouth daily.  (Patient not taking: Reported on 09/13/2020)   Evolocumab (REPATHA SURECLICK) 790 MG/ML SOAJ Inject 140 mg into the skin every 14 (fourteen) days.   Lancets (ONETOUCH DELICA PLUS [id number]) MISC USE TO CHECK BLOOD GLUCOSE  ONCE DAILY AS DIRECTED   metFORMIN (GLUCOPHAGE-XR) 500 MG 24 hr tablet TAKE 1 TABLET BY MOUTH  TWICE DAILY WITH A MEAL   ONETOUCH VERIO test strip CHECK FINGERSTICK BLOOD  SUGARS ONCE DAILY   Facility-Administered Encounter Medications as of 12/27/2020  Medication   betamethasone acetate-betamethasone sodium phosphate (CELESTONE) injection 3 mg   Care Gaps: COVID-19 Booster 2 Influenza Vaccine (Last completed 04/25/2020)  Star Rating Drugs: Metformin 500 mg last filled on 11/10/2020 for a 90-Day supply with [place]: Lab Results  Component Value Date/Time   HGBA1C 7.3 (H) 10/04/2020 08:35 AM   HGBA1C 8.0 (H) 04/25/2020 03:57 PM   HGBA1C 8.8 10/21/2018 12:00 AM   MICROALBUR 0.4 04/25/2020 03:57 PM   MICROALBUR 20 01/20/2019 09:16 AM   MICROALBUR 0.4 01/10/2017 09:13 AM    Kidney Function Lab Results  Component Value Date/Time   CREATININE 0.87 10/04/2020 08:35 AM   CREATININE 0.85 04/25/2020 03:57 PM   GFRNONAA 85 04/25/2020 03:57 PM   GFRAA 99 04/25/2020 03:57 PM    Current antihyperglycemic regimen:  Metformin 500 mg 1 tablet twice daily  What recent  interventions/DTPs have been made to improve glycemic control:  None ID  Have there been any recent hospitalizations or ED visits since last visit with CPP? No  Patient denies hypoglycemic symptoms, including Pale, Sweaty, Shaky, Hungry, Nervous/irritable, and Vision changes  Patient denies hyperglycemic symptoms, including blurry vision, excessive thirst, fatigue, polyuria, and weakness  How often are you checking your blood sugar? once daily  What are your blood sugars ranging?  Fasting: 10/01- 119, 10/02 -140, 10/03- 144, 10/04-128, 10/05-165 and patient reports the lowest number he has gotten lately is 94  During the week, how often does your blood glucose drop below 70? Never  Are you checking your feet daily/regularly? Yes no current issues  Adherence Review: Is the patient currently on a STATIN medication? No Is the patient currently on ACE/ARB medication? No Does the patient have >5 day gap between last estimated fill dates? No  Patient reports that he has been exercising a lot more lately. He has stopped drinking coffee, and tried cutting back on his sugar intake. Patient denies any issues today and stated that he is taking his medication as directed with no problems.  Patient has a future appointment with [name], CPP on 05/24/2021 @0830   Lynann Bologna, CPA/CMA Clinical Pharmacist Assistant Phone: [phone]

## 2021-01-23 ENCOUNTER — Other Ambulatory Visit: Payer: Self-pay

## 2021-01-23 ENCOUNTER — Encounter: Payer: Self-pay | Admitting: Family Medicine

## 2021-01-23 ENCOUNTER — Ambulatory Visit (INDEPENDENT_AMBULATORY_CARE_PROVIDER_SITE_OTHER): Payer: Medicare Other | Admitting: Family Medicine

## 2021-01-23 VITALS — BP 130/76 | HR 100 | Temp 98.1°F | Resp 18 | Ht 67.0 in | Wt 174.1 lb

## 2021-01-23 DIAGNOSIS — E1159 Type 2 diabetes mellitus with other circulatory complications: Secondary | ICD-10-CM | POA: Diagnosis not present

## 2021-01-23 DIAGNOSIS — Z23 Encounter for immunization: Secondary | ICD-10-CM | POA: Diagnosis not present

## 2021-01-23 DIAGNOSIS — E1169 Type 2 diabetes mellitus with other specified complication: Secondary | ICD-10-CM

## 2021-01-23 DIAGNOSIS — I152 Hypertension secondary to endocrine disorders: Secondary | ICD-10-CM

## 2021-01-23 DIAGNOSIS — Z5181 Encounter for therapeutic drug level monitoring: Secondary | ICD-10-CM

## 2021-01-23 DIAGNOSIS — E119 Type 2 diabetes mellitus without complications: Secondary | ICD-10-CM

## 2021-01-23 DIAGNOSIS — T466X5A Adverse effect of antihyperlipidemic and antiarteriosclerotic drugs, initial encounter: Secondary | ICD-10-CM

## 2021-01-23 DIAGNOSIS — M791 Myalgia, unspecified site: Secondary | ICD-10-CM

## 2021-01-23 DIAGNOSIS — K219 Gastro-esophageal reflux disease without esophagitis: Secondary | ICD-10-CM

## 2021-01-23 DIAGNOSIS — E785 Hyperlipidemia, unspecified: Secondary | ICD-10-CM

## 2021-01-23 NOTE — Progress Notes (Signed)
Name: Gerald Bauer   MRN: [id number]    DOB: [birth year]   Date:01/23/2021       Progress Note  Chief Complaint  Patient presents with   Follow-up   Diabetes   Hyperlipidemia     Subjective:   Gerald Bauer is a 75 y.o. male, presents to clinic for routine f/up  Hypertension:  Not on meds - with DM should be BP Readings from Last 3 Encounters:  01/23/21 130/76  09/13/20 134/90  07/21/20 128/80   Pt denies CP, SOB, exertional sx, LE edema, palpitation, Ha's, visual disturbances, lightheadedness, hypotension, syncope.   DM:   Pt managing DM with metformin, able to tolerate resuming low dose XR Reports good med compliance Pt has no SE from meds. Denies: Polyuria, polydipsia, vision changes, neuropathy, hypoglycemia Recent pertinent labs: Lab Results  Component Value Date   HGBA1C 7.3 (H) 10/04/2020   HGBA1C 8.0 (H) 04/25/2020   HGBA1C 7.6 (H) 11/09/2019   Lab Results  Component Value Date   MICROALBUR 0.4 04/25/2020   LDLCALC 133 (H) 10/04/2020   CREATININE 0.87 10/04/2020   Standard of care and health maintenance: Urine Microalbumin:  UTD - due Jan Foot exam:  UTD DM eye exam:  scheduled next week ACEI/ARB:  not taking  Statin:  did not tolerate    Hyperlipidemia: Currently treated with repatha, doing well with shots here to recheck lipids since starting med Last Lipids: Lab Results  Component Value Date   CHOL 209 (H) 10/04/2020   HDL 49 10/04/2020   LDLCALC 133 (H) 10/04/2020   TRIG 147 10/04/2020   CHOLHDL 4.3 10/04/2020   - Denies: Chest pain, shortness of breath, myalgias, claudication       Current Outpatient Medications:    Evolocumab (REPATHA SURECLICK) 409 MG/ML SOAJ, Inject 140 mg into the skin every 14 (fourteen) days., Disp: 2 mL, Rfl: 11   Lancets (ONETOUCH DELICA PLUS [id number]) MISC, USE TO CHECK BLOOD GLUCOSE  ONCE DAILY AS DIRECTED, Disp: 100 each, Rfl: 3   metFORMIN (GLUCOPHAGE-XR) 500 MG 24 hr tablet, TAKE 1 TABLET BY  MOUTH  TWICE DAILY WITH A MEAL, Disp: 180 tablet, Rfl: 3   ONETOUCH VERIO test strip, CHECK FINGERSTICK BLOOD  SUGARS ONCE DAILY, Disp: 100 strip, Rfl: 3   cholecalciferol (VITAMIN D) 1000 units tablet, Take 1 tablet (1,000 Units total) by mouth daily. (Patient not taking: Reported on 01/23/2021), Disp: 90 tablet, Rfl: 3   Cyanocobalamin (VITAMIN B-12 CR PO), Take 1,000 mcg by mouth daily. (Patient not taking: Reported on 01/23/2021), Disp: , Rfl:   Current Facility-Administered Medications:    betamethasone acetate-betamethasone sodium phosphate (CELESTONE) injection 3 mg, 3 mg, Intramuscular, Once, Edrick Kins, DPM  Patient Active Problem List   Diagnosis Date Noted   Aortic atherosclerosis ([place]) 04/27/2020   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   Left foot pain 01/10/2017   Tinea pedis of both feet 01/10/2017   Onychomycosis 01/10/2017   Needs flu shot 01/10/2017   Onychomycosis of multiple toenails with type 2 diabetes mellitus ([place]) 07/19/2016   SOB (shortness of breath) 07/14/2016   Carotid stenosis 03/13/2016   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 07/11/2015   Medication monitoring encounter 05/04/2015   Ganglion cyst 02/23/2015   Joint pain in fingers of right hand 02/23/2015   Hyperlipidemia, unspecified    HTN (hypertension)    Personal history of prostate cancer    Carotid atherosclerosis    Fatigue 08/20/2013   GERD (gastroesophageal reflux disease)  08/20/2013    Past Surgical History:  Procedure Laterality Date   APPENDECTOMY  1973   CHOLECYSTECTOMY  2013   PROSTATECTOMY  2012    Family History  Problem Relation Age of Onset   Dementia Mother    High Cholesterol Mother    Hypertension Mother    Arthritis Mother    Hyperlipidemia Mother    Heart disease Father    Heart attack Father    Arthritis Maternal Grandmother    Cancer Maternal Grandmother        colon   Prostate cancer Brother    Cancer Brother        prostate   Cancer Maternal Uncle         colon   COPD Maternal Uncle    Stroke Paternal Grandmother    Diabetes Paternal Grandmother    Aneurysm Paternal Grandfather     Social History   Tobacco Use   Smoking status: Former    Packs/day: 1.00    Years: 40.00    Pack years: 40.00    Types: Cigarettes    Quit date: 03/09/1997    Years since quitting: 23.8   Smokeless tobacco: Former   Tobacco comments:    smoking cessation materials not required  Vaping Use   Vaping Use: Never used  Substance Use Topics   Alcohol use: No    Alcohol/week: 0.0 standard drinks   Drug use: No     Allergies  Allergen Reactions   Amoxicillin Swelling   Metformin And Related Other (See Comments)    Abdominal pains   Statins Other (See Comments)    Affects the liver   Welchol [Colesevelam Hcl] Other (See Comments)    affects the liver    Health Maintenance  Topic Date Due   OPHTHALMOLOGY EXAM  04/09/2020   COVID-19 Vaccine (2 - [facility] risk series) 02/08/2021 (Originally 10/06/2019)   COLONOSCOPY (Pts 45-79yrs Insurance coverage will need to be confirmed)  04/25/2021 (Originally 10/01/2018)   HEMOGLOBIN A1C  04/06/2021   FOOT EXAM  04/25/2021   URINE MICROALBUMIN  04/25/2021   TETANUS/TDAP  03/04/2024   Pneumonia Vaccine 93+ Years old  Completed   INFLUENZA VACCINE  Completed   Hepatitis C Screening  Completed   Zoster Vaccines- Shingrix  Completed   HPV VACCINES  Aged Out    Chart Review Today: I personally reviewed active problem list, medication list, allergies, family history, social history, health maintenance, notes from last encounter, lab results, imaging with the patient/caregiver today.   Review of Systems  Constitutional: Negative.   HENT: Negative.    Eyes: Negative.   Respiratory: Negative.    Cardiovascular: Negative.   Gastrointestinal: Negative.   Endocrine: Negative.   Genitourinary: Negative.   Musculoskeletal: Negative.   Skin: Negative.   Allergic/Immunologic: Negative.   Neurological: Negative.    Hematological: Negative.   Psychiatric/Behavioral: Negative.    All other systems reviewed and are negative.   Objective:   Vitals:   01/23/21 1307  BP: 130/76  Pulse: 100  Resp: 18  Temp: 98.1 F (36.7 C)  TempSrc: Oral  SpO2: 97%  Weight: 174 lb 1.6 oz (79 kg)  Height: 5\' 7"  (1.702 m)    Body mass index is 27.27 kg/m.  Physical Exam Vitals and nursing note reviewed.  Constitutional:      General: He is not in acute distress.    Appearance: Normal appearance. He is well-developed. He is not ill-appearing, toxic-appearing or diaphoretic.     Interventions:  Face mask in place.  HENT:     Head: Normocephalic and atraumatic.     Jaw: No trismus.     Right Ear: External ear normal.     Left Ear: External ear normal.  Eyes:     General: Lids are normal. No scleral icterus.       Right eye: No discharge.        Left eye: No discharge.     Conjunctiva/sclera: Conjunctivae normal.  Neck:     Trachea: Trachea and phonation normal. No tracheal deviation.  Cardiovascular:     Rate and Rhythm: Normal rate and regular rhythm.     Pulses: Normal pulses.          Radial pulses are 2+ on the right side and 2+ on the left side.       Posterior tibial pulses are 2+ on the right side and 2+ on the left side.     Heart sounds: Normal heart sounds. No murmur heard.   No friction rub. No gallop.  Pulmonary:     Effort: Pulmonary effort is normal. No respiratory distress.     Breath sounds: Normal breath sounds. No stridor. No wheezing, rhonchi or rales.  Abdominal:     General: Bowel sounds are normal. There is no distension.     Palpations: Abdomen is soft.  Musculoskeletal:     Right lower leg: No edema.     Left lower leg: No edema.  Skin:    General: Skin is warm and dry.     Coloration: Skin is not jaundiced.     Findings: No rash.     Nails: There is no clubbing.  Neurological:     Mental Status: He is alert. Mental status is at baseline.     Cranial Nerves: No  dysarthria or facial asymmetry.     Motor: No tremor or abnormal muscle tone.     Gait: Gait normal.  Psychiatric:        Mood and Affect: Mood normal.        Speech: Speech normal.        Behavior: Behavior normal. Behavior is cooperative.        Assessment & Plan:   1. Type 2 diabetes mellitus without complication, without long-term current use of insulin (HCC) Tolerating metformin XR now, continued diet/lifestyle efforts No SE or concerns Due for recheck on labs, was uncontrolled - Hemoglobin A1C - COMPLETE METABOLIC PANEL WITH GFR  2. Hypertension associated with type 2 diabetes mellitus (HCC) BP borderline and pt should be on med - discussed low dose ACEI or ARB for renal protection - COMPLETE METABOLIC PANEL WITH GFR  3. Hyperlipidemia associated with type 2 diabetes mellitus ([place]) Repatha, statin intolerant, here for first recheck after starting - COMPLETE METABOLIC PANEL WITH GFR - Lipid panel  4. Gastroesophageal reflux disease, unspecified whether esophagitis present Stable, sx well controlled  5. Encounter for medication monitoring  - Hemoglobin A1C - COMPLETE METABOLIC PANEL WITH GFR - Lipid panel  6. Need for influenza vaccination  - Flu Vaccine QUAD High Dose(Fluad)  7. Myalgia due to statin On repatha - rechecking lipids and lfts    Return in about 6 months (around 07/23/2021).   [name], PA-C 01/23/21 1:42 PM

## 2021-01-24 LAB — LIPID PANEL
Cholesterol: 131 mg/dL (ref <200)
HDL: 48 mg/dL (ref >=40)
LDL Cholesterol (Calc): 50 mg/dL (calc)
Non-HDL Cholesterol (Calc): 83 mg/dL (calc) (ref <130)
Total CHOL/HDL Ratio: 2.7 (calc) (ref <5.0)
Triglycerides: 315 mg/dL — ABNORMAL HIGH (ref <150)

## 2021-01-24 LAB — COMPLETE METABOLIC PANEL WITH GFR
AG Ratio: 1.8 (calc) (ref 1.0–2.5)
ALT: 15 U/L (ref 9–46)
AST: 13 U/L (ref 10–35)
Albumin: 4.2 g/dL (ref 3.6–5.1)
Alkaline phosphatase (APISO): 53 U/L (ref 35–144)
BUN: 17 mg/dL (ref 7–25)
CO2: 26 mmol/L (ref 20–32)
Calcium: 9.2 mg/dL (ref 8.6–10.3)
Chloride: 104 mmol/L (ref 98–110)
Creat: 0.82 mg/dL (ref 0.70–1.28)
Globulin: 2.3 g/dL (calc) (ref 1.9–3.7)
Glucose, Bld: 152 mg/dL — ABNORMAL HIGH (ref 65–99)
Potassium: 4.1 mmol/L (ref 3.5–5.3)
Sodium: 139 mmol/L (ref 135–146)
Total Bilirubin: 0.4 mg/dL (ref 0.2–1.2)
Total Protein: 6.5 g/dL (ref 6.1–8.1)
eGFR: 91 mL/min/{1.73_m2} (ref >=60)

## 2021-01-24 LAB — HEMOGLOBIN A1C
Hgb A1c MFr Bld: 6.5 % of total Hgb — ABNORMAL HIGH (ref <5.7)
Mean Plasma Glucose: 140 mg/dL
eAG (mmol/L): 7.7 mmol/L

## 2021-01-31 LAB — HM DIABETES EYE EXAM

## 2021-02-20 ENCOUNTER — Encounter: Payer: Self-pay | Admitting: Family Medicine

## 2021-02-21 ENCOUNTER — Encounter: Payer: Self-pay | Admitting: Family Medicine

## 2021-02-21 DIAGNOSIS — E119 Type 2 diabetes mellitus without complications: Secondary | ICD-10-CM | POA: Insufficient documentation

## 2021-02-21 DIAGNOSIS — M791 Myalgia, unspecified site: Secondary | ICD-10-CM | POA: Insufficient documentation

## 2021-02-21 DIAGNOSIS — T466X5A Adverse effect of antihyperlipidemic and antiarteriosclerotic drugs, initial encounter: Secondary | ICD-10-CM | POA: Insufficient documentation

## 2021-03-02 ENCOUNTER — Other Ambulatory Visit: Payer: Self-pay | Admitting: Cardiovascular Disease

## 2021-03-02 NOTE — Telephone Encounter (Signed)
Scheduled 12/16

## 2021-03-02 NOTE — Telephone Encounter (Signed)
Please contact pt for future appointment. Pt overdue for 12 month f/u. Pt needing refills. 

## 2021-03-08 NOTE — Progress Notes (Addendum)
Date:  03/10/2021   ID:  Gerald Bauer, DOB [birth year], MRN [id number]  Patient Location:  [address] [place] [address]   Provider location:   [facility], [employer] office  PCP:  [name], PA-C  Cardiologist:  Patsy Baltimore   Chief Complaint  Patient presents with   Other    12 month f/u no complaints today. Meds reviewed verbally with pt.    History of Present Illness:    Gerald Bauer is a 75 y.o. male past medical history of HTN Syncope Palpitations Former smoke, 40 yrs Carotid stenosis, 40 to 59% on the left CT ABD aortic: Calcification in the proximal LAD, moderate aortic atherosclerosis noted Who presents for discussion of his hyperlipidemia  Lost wife 2017  Reports he has been doing well, some shortness of breath on exertion No chest pain concerning for angina  Typically active, reports no exercise program but stays busy On repatha , no side effects  Lab work reviewed LDL 50 A1C 6.5  Difficulty tolerating statins Reports he has tried Crestor, welcol Lovastatin, myalgia in back  Denies any recent near syncope or syncope symptoms  EKG personally reviewed by myself on todays visit NSR rate 106 bpm no ST changes  Prior history reviewed  syncopal episode at home doing Mother's Day dinner 2015 sitting on a couch in got up and fell to the floor  Loss of consciousness for short time.   echocardiogram which showed ejection fraction about 50%. Holter monitor was also unremarkable and regular stress test she was able to exercise 6 min and reached a heart rate of 150 and was unremarkable.   Past Medical History:  Diagnosis Date   Acid reflux    Arthritis    hands   Carotid atherosclerosis    Diabetes mellitus without complication (HCC)    Hyperlipemia    Hypertension    Prostate cancer ([place])    history   Tachycardia    Past Surgical History:  Procedure Laterality Date   APPENDECTOMY  1973    CHOLECYSTECTOMY  2013   PROSTATECTOMY  2012     Allergies:   Amoxicillin, Statins, and Welchol [colesevelam hcl]   Social History   Tobacco Use   Smoking status: Former    Packs/day: 1.00    Years: 40.00    Pack years: 40.00    Types: Cigarettes    Quit date: 03/09/1997    Years since quitting: 24.0   Smokeless tobacco: Former   Tobacco comments:    smoking cessation materials not required  Vaping Use   Vaping Use: Never used  Substance Use Topics   Alcohol use: No    Alcohol/week: 0.0 standard drinks   Drug use: No     Current Outpatient Medications on File Prior to Visit  Medication Sig Dispense Refill   Lancets (ONETOUCH DELICA PLUS [id number]) MISC USE TO CHECK BLOOD GLUCOSE  ONCE DAILY AS DIRECTED 100 each 3   metFORMIN (GLUCOPHAGE-XR) 500 MG 24 hr tablet TAKE 1 TABLET BY MOUTH  TWICE DAILY WITH A MEAL 180 tablet 3   ONETOUCH VERIO test strip CHECK FINGERSTICK BLOOD  SUGARS ONCE DAILY 100 strip 3   REPATHA SURECLICK 154 MG/ML SOAJ INJECT 140MG  SUBCUTANEOUSLY EVERY 2 WEEKS 2 mL 0   Current Facility-Administered Medications on File Prior to Visit  Medication Dose Route Frequency Provider Last Rate Last Admin   betamethasone acetate-betamethasone sodium phosphate (CELESTONE) injection 3 mg  3 mg  Intramuscular Once Edrick Kins, DPM         Family Hx: The patient's family history includes Aneurysm in his paternal grandfather; Arthritis in his maternal grandmother and mother; COPD in his maternal uncle; Cancer in his brother, maternal grandmother, and maternal uncle; Dementia in his mother; Diabetes in his paternal grandmother; Heart attack in his father; Heart disease in his father; High Cholesterol in his mother; Hyperlipidemia in his mother; Hypertension in his mother; Prostate cancer in his brother; Stroke in his paternal grandmother.  ROS:   Please see the history of present illness.    Review of Systems  Constitutional: Negative.   HENT: Negative.    Respiratory:  Negative.    Cardiovascular: Negative.   Gastrointestinal: Negative.   Musculoskeletal: Negative.   Neurological: Negative.   Psychiatric/Behavioral: Negative.    All other systems reviewed and are negative.   Labs/Other Tests and Data Reviewed:    Recent Labs: 04/25/2020: Hemoglobin 16.1; Platelets 281 01/23/2021: ALT 15; BUN 17; Creat 0.82; Potassium 4.1; Sodium 139   Recent Lipid Panel Lab Results  Component Value Date/Time   CHOL 131 01/23/2021 02:00 PM   CHOL 122 08/10/2019 08:26 AM   TRIG 315 (H) 01/23/2021 02:00 PM   HDL 48 01/23/2021 02:00 PM   HDL 48 08/10/2019 08:26 AM   CHOLHDL 2.7 01/23/2021 02:00 PM   LDLCALC 50 01/23/2021 02:00 PM    Wt Readings from Last 3 Encounters:  03/10/21 178 lb 2 oz (80.8 kg)  01/23/21 174 lb 1.6 oz (79 kg)  09/13/20 175 lb 9.6 oz (79.7 kg)     Exam:    Vital Signs: Vital signs may also be detailed in the HPI BP 140/90 (BP Location: Left Arm, Patient Position: Sitting, Cuff Size: Normal)    Pulse (!) 106    Ht 5\' 7"  (1.702 m)    Wt 178 lb 2 oz (80.8 kg)    SpO2 97%    BMI 27.90 kg/m   Constitutional:  oriented to person, place, and time. No distress.  HENT:  Head: Grossly normal Eyes:  no discharge. No scleral icterus.  Neck: No JVD, no carotid bruits  Cardiovascular: Regular rate and rhythm, no murmurs appreciated Pulmonary/Chest: Clear to auscultation bilaterally, no wheezes or rails Abdominal: Soft.  no distension.  no tenderness.  Musculoskeletal: Normal range of motion Neurological:  normal muscle tone. Coordination normal. No atrophy Skin: Skin warm and dry Psychiatric: normal affect, pleasant   ASSESSMENT & PLAN:    Problem List Items Addressed This Visit       Cardiology Problems   Carotid atherosclerosis (Chronic)   Aortic atherosclerosis (HCC)   Other Visit Diagnoses     PAD (peripheral artery disease) ([place])    -  Primary   Essential hypertension       Hyperlipidemia, unspecified hyperlipidemia type           PAD/aortic atherosclerosis Seen on CT scan from 2012 moderate in the abdominal aorta, Prior smoker,  Intolerant of statins including Crestor lovastatin WelChol with myalgias On Repatha, lipids well-controlled  Coronary calcification At least moderate, seen on CT scan 2012 Particularly proximal LAD, proximal RCA Denies anginal symptoms, no further testing  Carotid stenosis 40 to 59% on the left in 2019 Repatha with excellent cholesterol numbers Consider repeat in follow-up  Hyperlipidemia Statin intolerant on Repatha, numbers well controlled  Diabetes type 2 A1c reasonable 6.5 Off metformin, stomach problems Walking program, low carbs  Shortness of breath 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

## 2021-03-10 ENCOUNTER — Ambulatory Visit: Payer: Medicare Other | Admitting: Cardiovascular Disease

## 2021-03-10 ENCOUNTER — Other Ambulatory Visit: Payer: Self-pay

## 2021-03-10 ENCOUNTER — Encounter: Payer: Self-pay | Admitting: Cardiovascular Disease

## 2021-03-10 VITALS — BP 140/90 | HR 106 | Ht 67.0 in | Wt 178.1 lb

## 2021-03-10 DIAGNOSIS — I10 Essential (primary) hypertension: Secondary | ICD-10-CM | POA: Diagnosis not present

## 2021-03-10 DIAGNOSIS — I739 Peripheral vascular disease, unspecified: Secondary | ICD-10-CM | POA: Diagnosis not present

## 2021-03-10 DIAGNOSIS — E785 Hyperlipidemia, unspecified: Secondary | ICD-10-CM

## 2021-03-10 DIAGNOSIS — I6529 Occlusion and stenosis of unspecified carotid artery: Secondary | ICD-10-CM | POA: Diagnosis not present

## 2021-03-10 DIAGNOSIS — I700 Atherosclerosis of aorta: Secondary | ICD-10-CM

## 2021-03-10 MED ORDER — ALBUTEROL SULFATE HFA 108 (90 BASE) MCG/ACT IN AERS: 2.0000 | INHALATION_SPRAY | Freq: Four times a day (QID) | RESPIRATORY_TRACT | 0 refills | Status: DC | PRN

## 2021-03-10 MED ORDER — REPATHA SURECLICK 140 MG/ML ~~LOC~~ SOAJ: SUBCUTANEOUS | 11 refills | Status: DC

## 2021-03-10 MED ORDER — ALBUTEROL SULFATE HFA 108 (90 BASE) MCG/ACT IN AERS: 2.0000 | INHALATION_SPRAY | Freq: Four times a day (QID) | RESPIRATORY_TRACT | 2 refills | Status: DC | PRN

## 2021-03-10 NOTE — Patient Instructions (Addendum)
Medication Instructions:  No changes  If you need a refill on your cardiac medications before your next appointment, please call your pharmacy.   Lab work: No new labs needed  Testing/Procedures: No new testing needed  Follow-Up: At [facility], you and your health needs are our priority.  As part of our continuing mission to provide you with exceptional heart care, we have created designated Provider Care Teams.  These Care Teams include your primary Cardiologist (physician) and Advanced Practice Providers (APPs -  Physician Assistants and Nurse Practitioners) who all work together to provide you with the care you need, when you need it.  You will need a follow up appointment in 12 months  Providers on your designated Care Team:   Christopher Berge, NP Ryan Dunn, PA-C Cadence Furth, PA-C  COVID-19 Vaccine Information can be found at: [email or web address] For questions related to vaccine distribution or appointments, please email [email or web address] or call [phone].   

## 2021-04-12 ENCOUNTER — Other Ambulatory Visit: Payer: Self-pay | Admitting: Cardiovascular Disease

## 2021-05-02 ENCOUNTER — Telehealth: Payer: Self-pay

## 2021-05-02 NOTE — Progress Notes (Signed)
Chronic Care Management Pharmacy Assistant   Name: Gerald Bauer  MRN: [id number] DOB: 1945-05-10  Reason for Encounter: Hypertension Disease State Call   Recent office visits:  01/23/2021 Gerald Grana, PA-C (PCP Office Visit) for Follow-up- No medication changes noted, lab orders placed, patient instructed to follow-up in 6 months  Recent consult visits:  03/10/2021 Gerald Rogue, MD (Cardiology) for PAP- Started: Albuterol Sulfate 108 2 puffs q6h prn, Stopped: Cholecalciferol 1000 units, Cyanocobalamin 1000 mcg due to patient reported not taking, EKG 12-Lead orderd, patient to follow-up in 12 months  Hospital visits:  None in previous 6 months  Medications: Outpatient Encounter Medications as of 05/02/2021  Medication Sig   albuterol (VENTOLIN HFA) 108 (90 Base) MCG/ACT inhaler Inhale 2 puffs into the lungs every 6 (six) hours as needed for wheezing or shortness of breath.   Lancets (ONETOUCH DELICA PLUS [id number]) MISC USE TO CHECK BLOOD GLUCOSE  ONCE DAILY AS DIRECTED   metFORMIN (GLUCOPHAGE-XR) 500 MG 24 hr tablet TAKE 1 TABLET BY MOUTH  TWICE DAILY WITH A MEAL   ONETOUCH VERIO test strip CHECK FINGERSTICK BLOOD  SUGARS ONCE DAILY   REPATHA SURECLICK 174 MG/ML SOAJ INJECT 140MG  SUBCUTANEOUSLY  EVERY 2 WEEKS   Facility-Administered Encounter Medications as of 05/02/2021  Medication   betamethasone acetate-betamethasone sodium phosphate (CELESTONE) injection 3 mg   Care Gaps: Colonoscopy COVID-19 Vaccine Diabetic Foot Exam Urine Microalbumin  Star Rating Drugs: Metformin 500 mg last filled on 02/01/2021 for a 90-Day supply with [facility]  Reviewed chart prior to disease state call. Spoke with patient regarding BP  Recent Office Vitals: BP Readings from Last 3 Encounters:  03/10/21 140/90  01/23/21 130/76  09/13/20 134/90   Pulse Readings from Last 3 Encounters:  03/10/21 (!) 106  01/23/21 100  09/13/20 99    Wt Readings from Last 3 Encounters:  03/10/21  178 lb 2 oz (80.8 kg)  01/23/21 174 lb 1.6 oz (79 kg)  09/13/20 175 lb 9.6 oz (79.7 kg)     Kidney Function Lab Results  Component Value Date/Time   CREATININE 0.82 01/23/2021 02:00 PM   CREATININE 0.87 10/04/2020 08:35 AM   GFRNONAA 85 04/25/2020 03:57 PM   GFRAA 99 04/25/2020 03:57 PM    BMP Latest Ref Rng & Units 01/23/2021 10/04/2020 04/25/2020  Glucose 65 - 99 mg/dL 152(H) 148(H) 130(H)  BUN 7 - 25 mg/dL 17 14 20   Creatinine 0.70 - 1.28 mg/dL 0.82 0.87 0.85  BUN/Creat Ratio 6 - 22 (calc) NOT APPLICABLE NOT APPLICABLE NOT APPLICABLE  Sodium 944 - 146 mmol/L 139 139 138  Potassium 3.5 - 5.3 mmol/L 4.1 4.6 4.4  Chloride 98 - 110 mmol/L 104 103 100  CO2 20 - 32 mmol/L 26 27 26   Calcium 8.6 - 10.3 mg/dL 9.2 9.2 10.0    Current antihypertensive regimen:  None  How often are you checking your Blood Pressure? infrequently  Current home BP readings: There is not reading to provide at this moment. Patient reports he saw Cardio in December and his BP was fine.  What recent interventions/DTPs have been made by any provider to improve Blood Pressure control since last CPP Visit: PCP informed the patient at last visit that he was borderline for HTN, and discusses placing him on a medication in the future  Any recent hospitalizations or ED visits since last visit with CPP? No  What diet changes have been made to improve Blood Pressure Control?  Patient reports no diet changes  What  exercise is being done to improve your Blood Pressure Control?  Patient walks daily  Adherence Review: Is the patient currently on ACE/ARB medication? No Does the patient have >5 day gap between last estimated fill dates? No  Patient has a scheduled telephone appointment with Gerald Bauer, CPP on 05/24/2021 @ 0830 Patient has a scheduled AWV on 09/14/2021 @ [name], CPA/CMA Catering manager Phone: [phone]

## 2021-05-23 ENCOUNTER — Telehealth: Payer: Self-pay

## 2021-05-23 NOTE — Progress Notes (Signed)
° ° °  Chronic Care Management Pharmacy Assistant   Name: Tykeem Lanzer  MRN: [id number] DOB: Aug 28, 1945  Patient called to be reminded of his telephone appointment with [name], CPP on 05/24/2021 @ 0830  Patient aware of appointment date, time, and type of appointment (either telephone or in person). Patient aware to have/bring all medications, supplements, blood pressure and/or blood sugar logs to visit.  Questions: Are there any concerns you would like to discuss during your office visit? Patient stated he does not think so everything is going well  Are you having any problems obtaining your medications? Not at this time  Star Rating Drug: Metformin 500 mg last filled on 04/26/2021 for a 90-Day supply with [facility]  Any gaps in medications fill history? No  Care Gaps: Colonoscopy (last completed 09/30/2013 most insurance's only cover every 10 years) COVID-19 Vaccine Booster 2 Diabetic Foot Exam Urine Microalbumin   Lynann Bologna, CPA/CMA Clinical Pharmacist Assistant Phone: [phone]

## 2021-05-24 ENCOUNTER — Ambulatory Visit (INDEPENDENT_AMBULATORY_CARE_PROVIDER_SITE_OTHER): Payer: Medicare Other

## 2021-05-24 DIAGNOSIS — E119 Type 2 diabetes mellitus without complications: Secondary | ICD-10-CM

## 2021-05-24 DIAGNOSIS — E1169 Type 2 diabetes mellitus with other specified complication: Secondary | ICD-10-CM

## 2021-05-24 NOTE — Progress Notes (Signed)
Chronic Care Management Pharmacy Note  05/24/2021 Name:  Gerald Bauer MRN:  [id number] DOB:  June 12, 1945  Summary: Patient presents for CCM follow-up.  Recommendations/Changes made from today's visit: Continue current medications  Plan: CPP follow-up as needed   Subjective: Gerald Bauer is an 76 y.o. year old male who is a primary patient of [name], [place].  The CCM team was consulted for assistance with disease management and care coordination needs.    Engaged with patient by telephone for follow up visit in response to provider referral for pharmacy case management and/or care coordination services.   Consent to Services:  The patient was given information about Chronic Care Management services, agreed to services, and gave verbal consent prior to initiation of services.  Please see initial visit note for detailed documentation.   Patient Care Team: [name], PA-C as PCP - General (Family Medicine) [name], [facility] as Pharmacist (Pharmacist) [name], MD as Consulting Physician (Cardiology)  Recent office visits: 01/23/21: Patient presented to [name], PA-C for follow-up.   Recent consult visits: 03/10/21: Patient presented to Dr. Rockey Situ (cardiology) for follow-up.   Hospital visits: None in previous 6 months  Objective:  Lab Results  Component Value Date   CREATININE 0.82 01/23/2021   BUN 17 01/23/2021   GFRNONAA 85 04/25/2020   GFRAA 99 04/25/2020   NA 139 01/23/2021   K 4.1 01/23/2021   CALCIUM 9.2 01/23/2021   CO2 26 01/23/2021    Lab Results  Component Value Date/Time   HGBA1C 6.5 (H) 01/23/2021 02:00 PM   HGBA1C 7.3 (H) 10/04/2020 08:35 AM   HGBA1C 8.8 10/21/2018 12:00 AM   MICROALBUR 0.4 04/25/2020 03:57 PM   MICROALBUR 20 01/20/2019 09:16 AM   MICROALBUR 0.4 01/10/2017 09:13 AM    Last diabetic Eye exam:  Lab Results  Component Value Date/Time   HMDIABEYEEXA No Retinopathy 01/31/2021 12:00 AM    Last  diabetic Foot exam: No results found for: HMDIABFOOTEX   Lab Results  Component Value Date   CHOL 131 01/23/2021   HDL 48 01/23/2021   LDLCALC 50 01/23/2021   TRIG 315 (H) 01/23/2021   CHOLHDL 2.7 01/23/2021    Hepatic Function Latest Ref Rng & Units 01/23/2021 10/04/2020 04/25/2020  Total Protein 6.1 - 8.1 g/dL 6.5 6.7 7.1  Albumin 3.7 - 4.7 g/dL - - -  AST 10 - 35 U/L 13 13 84(H)  ALT 9 - 46 U/L 15 17 190(H)  Alk Phosphatase 48 - 121 IU/L - - -  Total Bilirubin 0.2 - 1.2 mg/dL 0.4 0.8 0.5  Bilirubin, Direct 0.00 - 0.40 mg/dL - - -    Lab Results  Component Value Date/Time   TSH 2.11 01/10/2017 09:13 AM    CBC Latest Ref Rng & Units 04/25/2020 11/09/2019 01/20/2019  WBC 3.8 - 10.8 Thousand/uL 8.2 7.6 7.4  Hemoglobin 13.2 - 17.1 g/dL 16.1 15.6 15.1  Hematocrit 38.5 - 50.0 % 46.6 45.5 43.5  Platelets 140 - 400 Thousand/uL 281 247 249    Lab Results  Component Value Date/Time   VD25OH 38.3 01/13/2018 09:41 AM   VD25OH 31 01/10/2017 09:13 AM    Clinical ASCVD: Yes  The 10-year ASCVD risk score (Arnett DK, et al., 2019) is: 45.7%   Values used to calculate the score:     Age: 77 years     Sex: Male     Is Non-Hispanic African American: No     Diabetic: Yes  Tobacco smoker: No     Systolic Blood Pressure: 450 mmHg     Is BP treated: No     HDL Cholesterol: 48 mg/dL     Total Cholesterol: 131 mg/dL    Depression screen [facility] 2/9 01/23/2021 09/13/2020 07/21/2020  Decreased Interest 0 0 0  Down, Depressed, Hopeless 0 0 0  PHQ - 2 Score 0 0 0  Altered sleeping 0 - 0  Tired, decreased energy 0 - 0  Change in appetite 0 - 0  Feeling bad or failure about yourself  0 - 0  Trouble concentrating 0 - 0  Moving slowly or fidgety/restless 0 - 0  Suicidal thoughts 0 - 0  PHQ-9 Score 0 - 0  Difficult doing work/chores Not difficult at all - Not difficult at all  Some recent data might be hidden    Social History   Tobacco Use  Smoking Status Former   Packs/day: 1.00    Years: 40.00   Pack years: 40.00   Types: Cigarettes   Quit date: 03/09/1997   Years since quitting: 24.2  Smokeless Tobacco Former  Tobacco Comments   smoking cessation materials not required   BP Readings from Last 3 Encounters:  03/10/21 140/90  01/23/21 130/76  09/13/20 134/90   Pulse Readings from Last 3 Encounters:  03/10/21 (!) 106  01/23/21 100  09/13/20 99   Wt Readings from Last 3 Encounters:  03/10/21 178 lb 2 oz (80.8 kg)  01/23/21 174 lb 1.6 oz (79 kg)  09/13/20 175 lb 9.6 oz (79.7 kg)    Assessment/Interventions: Review of patient past medical history, allergies, medications, health status, including review of consultants reports, laboratory and other test data, was performed as part of comprehensive evaluation and provision of chronic care management services.   SDOH:  (Social Determinants of Health) assessments and interventions performed: Yes SDOH Interventions    Flowsheet Row Most Recent Value  SDOH Interventions   Financial Strain Interventions Intervention Not Indicated         CCM Care Plan  Allergies  Allergen Reactions   Amoxicillin Swelling   Statins Other (See Comments)    Affects the liver   Welchol [Colesevelam Hcl] Other (See Comments)    affects the liver    Medications Reviewed Today     Reviewed by Britt Bottom, CMA (Certified Medical Assistant) on 03/10/21 at 1344  Med List Status: <None>   Medication Order Taking? Sig Documenting Provider Last Dose Status Informant  betamethasone acetate-betamethasone sodium phosphate (CELESTONE) injection 3 mg [id number]   Edrick Kins, DPM  Active   cholecalciferol (VITAMIN D) 1000 units tablet [id number]  Take 1 tablet (1,000 Units total) by mouth daily.  Patient not taking: Reported on 01/23/2021   Arnetha Courser, MD  Active   Cyanocobalamin (VITAMIN B-12 CR PO) [id number]  Take 1,000 mcg by mouth daily.  Patient not taking: Reported on 01/23/2021   [provider]  Active             Med Note Clemetine Marker D   Tue Sep 13, 2020  1:32 PM)    Lancets (ONETOUCH DELICA PLUS PVVZSM27M) [place] [id number]  USE TO CHECK BLOOD GLUCOSE  ONCE DAILY AS DIRECTED [name], PA-C  Active   metFORMIN (GLUCOPHAGE-XR) 500 MG 24 hr tablet [id number]  TAKE 1 TABLET BY MOUTH  TWICE DAILY WITH A MEAL [name], PA-C  Active   [facility] VERIO test strip [id number]  CHECK FINGERSTICK BLOOD  SUGARS ONCE DAILY  [name], PA-C  Active   REPATHA SURECLICK 373 MG/ML SOAJ [id number]  INJECT $Remov'140MG'BCqmDA$  SUBCUTANEOUSLY EVERY 2 WEEKS [name], MD  Active             Patient Active Problem List   Diagnosis Date Noted   Myalgia due to statin 02/21/2021   Type 2 diabetes mellitus without complication, without long-term current use of insulin ([place]) 02/21/2021   Aortic atherosclerosis ([place]) 04/27/2020   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   Left foot pain 01/10/2017   Tinea pedis of both feet 01/10/2017   Onychomycosis 01/10/2017   Needs flu shot 01/10/2017   Onychomycosis of multiple toenails with type 2 diabetes mellitus ([place]) 07/19/2016   SOB (shortness of breath) 07/14/2016   Carotid stenosis 03/13/2016   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 07/11/2015   Medication monitoring encounter 05/04/2015   Ganglion cyst 02/23/2015   Joint pain in fingers of right hand 02/23/2015   Hyperlipidemia associated with type 2 diabetes mellitus ([place])    Hypertension associated with type 2 diabetes mellitus ([place])    Personal history of prostate cancer    Carotid atherosclerosis    Fatigue 08/20/2013   GERD (gastroesophageal reflux disease) 08/20/2013    Immunization History  Administered Date(s) Administered   Fluad Quad(high Dose 65+) 01/20/2019, 04/25/2020, 01/23/2021   Influenza, High Dose Seasonal PF 01/10/2017, 01/13/2018   Influenza-Unspecified 01/10/2012, 01/01/2013, 01/01/2015   Janssen (J&J) SARS-COV-2 Vaccination 09/08/2019   Pneumococcal Conjugate-13 07/30/2013   Pneumococcal  Polysaccharide-23 02/27/2011   Td 03/04/2014   Zoster Recombinat (Shingrix) 07/01/2016, 01/31/2017   Zoster, Live 02/17/2014    Conditions to be addressed/monitored:  Hypertension, Hyperlipidemia, Diabetes and Coronary Artery Disease  Care Plan : General Pharmacy (Adult)  Updates made by [name], RPH since 05/24/2021 12:00 AM     Problem: Hypertension, Hyperlipidemia, Diabetes and Coronary Artery Disease   Priority: High     Long-Range Goal: Patient-Specific Goal   Start Date: 05/05/2020  Expected End Date: 05/25/2022  This Visit's Progress: On track  Recent Progress: On track  Priority: High  Note:   Current Barriers:  Unable to achieve control of Diabetes  Does not adhere to prescribed medication regimen  Pharmacist Clinical Goal(s):  Over the next 90 days, patient will achieve control of Diabetes as evidenced by A1c less than 7% through collaboration with PharmD and provider.   Interventions: 1:1 collaboration with [name], PA-C regarding development and update of comprehensive plan of care as evidenced by provider attestation and co-signature Inter-disciplinary care team collaboration (see longitudinal plan of care) Comprehensive medication review performed; medication list updated in electronic medical record  Hypertension (BP goal <140/90) -Controlled -Current treatment: None -Medications previously tried: Amlodipine (reflux)   -Current home readings: 127/90  -Denies hypotensive/hypertensive symptoms -Educated on Daily salt intake goal < 2300 mg; -Counseled to monitor BP at home 1-2 times weekly, document, and provide log at future appointments -Counseled on diet and exercise extensively  Hyperlipidemia: (LDL goal < 70) -Uncontrolled -Current treatment: Repatha 140 mg into the skin every two weeks: Appropriate, Effective, Safe, Accessible -Medications previously tried: Zetia  -Patient concerned that Repatha may be increasing his blood sugars as he  has noticed his readings tend to be higher the 2-3 days after he administers the injection.  -Continue current medications   Diabetes (A1c goal <7%) -Controlled -Current medications: Metformin XR 500 mg twice daily: Appropriate, Effective, Safe, Accessible  -Medications previously tried: NA  -Current home glucose readings:  -Fasting: 155, 164,  146, 144   -Afternoon: 160, 152, 126  -Denies hypoglycemic/hyperglycemic symptoms -Current meal patterns: Snacking on more apples. Protein bars. Meat + two vegetables. Trying to limit unhealthy snack like potatoes and ice cream, but does indulge from time to time.   -Current exercise: Walking 30 minutes daily -Continue current medications  Patient Goals/Self-Care Activities Over the next 90 days, patient will:  - take medications as prescribed check glucose daily (Fasting) , document, and provide at future appointments check blood pressure 1-2 times weekly , document, and provide at future appointments  Follow Up Plan: No further follow up required: Patient will follow-up as needed if future issues arise.         Medication Assistance: None required.  Patient affirms current coverage meets needs.  Patient's preferred pharmacy is:  [employer] ([place], [place] [facility] [place] [address] [place] [address] Phone: [phone] Fax: [place], [place] [address] Phone: [phone] Fax: [phone]  [facility] ([facility] ) - [place], [address] [place] [address] Phone: [phone] Fax: [phone]   Uses pill box? Yes Pt endorses 100% compliance  We discussed: Current pharmacy is preferred with insurance plan and patient is satisfied with pharmacy services Patient decided to: Continue current medication management strategy  Care Plan and  Follow Up Patient Decision:  Patient agrees to Care Plan and Follow-up.  Plan: No further follow up required: Patient will follow-up as needed if future issues arise.   [facility] [phone]

## 2021-05-24 NOTE — Patient Instructions (Signed)
Visit Information ?It was great speaking with you today!  Please let me know if you have any questions about our visit. ? ? Goals Addressed   ? ?  ?  ?  ?  ? This Visit's Progress  ?  Monitor and Manage My Blood Sugar-Diabetes Type 2   On track  ?  Timeframe:  Long-Range Goal ?Priority:  High ?Start Date:    05/05/2020                         ?Expected End Date:   03/19/2022                   ? ?Follow Up Date 01/22/2021   ?  ?- check blood sugar daily before breakfast ?- check blood sugar if I feel it is too high or too low  ?  ?Why is this important?   ?Checking your blood sugar at home helps to keep it from getting very high or very low.  ?Writing the results in a diary or log helps the doctor know how to care for you.  ?Your blood sugar log should have the time, date and the results.  ?Also, write down the amount of insulin or other medicine that you take.  ?Other information, like what you ate, exercise done and how you were feeling, will also be helpful.   ?  ?Notes:  ?  ? ?  ? ? ?Patient Care Plan: General Pharmacy (Adult)  ?  ? ?Problem Identified: Hypertension, Hyperlipidemia, Diabetes and Coronary Artery Disease   ?Priority: High  ?  ? ?Long-Range Goal: Patient-Specific Goal   ?Start Date: 05/05/2020  ?Expected End Date: 05/25/2022  ?This Visit's Progress: On track  ?Recent Progress: On track  ?Priority: High  ?Note:   ?Current Barriers:  ?Unable to achieve control of Diabetes  ?Does not adhere to prescribed medication regimen ? ?Pharmacist Clinical Goal(s):  ?Over the next 90 days, patient will achieve control of Diabetes as evidenced by A1c less than 7% through collaboration with PharmD and provider.  ? ?Interventions: ?1:1 collaboration with [name], PA-C regarding development and update of comprehensive plan of care as evidenced by provider attestation and co-signature ?Inter-disciplinary care team collaboration (see longitudinal plan of care) ?Comprehensive medication review performed; medication  list updated in electronic medical record ? ?Hypertension (BP goal <140/90) ?-Controlled ?-Current treatment: ?None ?-Medications previously tried: Amlodipine (reflux)   ?-Current home readings: 127/90  ?-Denies hypotensive/hypertensive symptoms ?-Educated on Daily salt intake goal < 2300 mg; ?-Counseled to monitor BP at home 1-2 times weekly, document, and provide log at future appointments ?-Counseled on diet and exercise extensively ? ?Hyperlipidemia: (LDL goal < 70) ?-Uncontrolled ?-Current treatment: ?Repatha 140 mg into the skin every two weeks: Appropriate, Effective, Safe, Accessible ?-Medications previously tried: Zetia  ?-Patient concerned that Repatha may be increasing his blood sugars as he has noticed his readings tend to be higher the 2-3 days after he administers the injection.  ?-Continue current medications  ? ?Diabetes (A1c goal <7%) ?-Controlled ?-Current medications: ?Metformin XR 500 mg twice daily: Appropriate, Effective, Safe, Accessible  ?-Medications previously tried: NA  ?-Current home glucose readings: ? -Fasting: 155, 164, 146, 144  ? -Afternoon: 160, 152, 126  ?-Denies hypoglycemic/hyperglycemic symptoms ?-Current meal patterns: Snacking on more apples. Protein bars. Meat + two vegetables. Trying to limit unhealthy snack like potatoes and ice cream, but does indulge from time to time.   ?-Current exercise: Walking 30 minutes daily ?-  Continue current medications ? ?Patient Goals/Self-Care Activities ?Over the next 90 days, patient will:  ?- take medications as prescribed ?check glucose daily (Fasting) , document, and provide at future appointments ?check blood pressure 1-2 times weekly , document, and provide at future appointments ? ?Follow Up Plan: No further follow up required: Patient will follow-up as needed if future issues arise.  ?  ? ? ? ?Patient agreed to services and verbal consent obtained.  ? ?Patient verbalizes understanding of instructions and care plan provided today and  agrees to view in [place]. Active MyChart status confirmed with patient.   ? ?Doristine Section, BCACP, CPP ?Clinical Pharmacist Practitioner  ?[facility] ?[phone]  ?

## 2021-06-23 DIAGNOSIS — E1159 Type 2 diabetes mellitus with other circulatory complications: Secondary | ICD-10-CM | POA: Diagnosis not present

## 2021-06-23 DIAGNOSIS — I10 Essential (primary) hypertension: Secondary | ICD-10-CM

## 2021-06-23 DIAGNOSIS — E785 Hyperlipidemia, unspecified: Secondary | ICD-10-CM | POA: Diagnosis not present

## 2021-06-23 DIAGNOSIS — Z7984 Long term (current) use of oral hypoglycemic drugs: Secondary | ICD-10-CM | POA: Diagnosis not present

## 2021-07-25 ENCOUNTER — Ambulatory Visit: Payer: Medicare Other | Admitting: Family Medicine

## 2021-08-11 ENCOUNTER — Other Ambulatory Visit: Payer: Self-pay | Admitting: Family Medicine

## 2021-08-11 DIAGNOSIS — E1165 Type 2 diabetes mellitus with hyperglycemia: Secondary | ICD-10-CM

## 2021-08-14 ENCOUNTER — Other Ambulatory Visit: Payer: Self-pay

## 2021-09-14 ENCOUNTER — Encounter: Payer: Self-pay | Admitting: Physician Assistant

## 2021-09-14 ENCOUNTER — Ambulatory Visit (INDEPENDENT_AMBULATORY_CARE_PROVIDER_SITE_OTHER): Payer: Medicare Other | Admitting: Physician Assistant

## 2021-09-14 ENCOUNTER — Other Ambulatory Visit: Payer: Self-pay

## 2021-09-14 VITALS — BP 128/76 | HR 100 | Temp 98.0°F | Resp 16 | Ht 67.0 in | Wt 176.8 lb

## 2021-09-14 DIAGNOSIS — E1165 Type 2 diabetes mellitus with hyperglycemia: Secondary | ICD-10-CM

## 2021-09-14 DIAGNOSIS — Z122 Encounter for screening for malignant neoplasm of respiratory organs: Secondary | ICD-10-CM

## 2021-09-14 DIAGNOSIS — Z1283 Encounter for screening for malignant neoplasm of skin: Secondary | ICD-10-CM

## 2021-09-14 NOTE — Progress Notes (Signed)
Patient: Gerald Bauer, Male    DOB: 07-06-1945, 76 y.o.   MRN: [id number]  Visit Date: 09/14/2021  Today's Provider: Dani Gobble [place] Behlke, PA-C  Introduced myself to the patient as a PA-C and provided education on APPs in clinical practice.    Chief Complaint  Patient presents with   Medicare Wellness    Subjective:   Gerald Bauer is a 76 y.o. male who presents today for his Subsequent Annual Wellness Visit.    HPI  IPSS Questionnaire (AUA-7): Over the past month.   1)  How often have you had a sensation of not emptying your bladder completely after you finish urinating?  0 - Not at all  2)  How often have you had to urinate again less than two hours after you finished urinating? 0 - Not at all  3)  How often have you found you stopped and started again several times when you urinated?  0 - Not at all  4) How difficult have you found it to postpone urination?  0 - Not at all  5) How often have you had a weak urinary stream?  0 - Not at all  6) How often have you had to push or strain to begin urination?  0 - Not at all  7) How many times did you most typically get up to urinate from the time you went to bed until the time you got up in the morning?  1 - 1 time  Total score:  0-7 mildly symptomatic   8-19 moderately symptomatic   20-35 severely symptomatic     Review of Systems  Gastrointestinal:  Negative for diarrhea, nausea and vomiting.  Endocrine: Negative for polydipsia, polyphagia and polyuria.   Constitutional: Negative for fever or weight change.  Respiratory: Negative for cough and shortness of breath.   Cardiovascular: Negative for chest pain or palpitations.  Gastrointestinal: Negative for abdominal pain, no bowel changes.  Musculoskeletal: Negative for gait problem or joint swelling.  Skin: Negative for rash.  Neurological: Negative for dizziness or headache.  No other specific complaints in a complete review of systems (except as listed in HPI  above).  Past Medical History:  Diagnosis Date   Acid reflux    Arthritis    hands   Carotid atherosclerosis    Diabetes mellitus without complication (HCC)    Hyperlipemia    Hypertension    Prostate cancer ([place])    history   Tachycardia     Past Surgical History:  Procedure Laterality Date   APPENDECTOMY  1973   CHOLECYSTECTOMY  2013   PROSTATECTOMY  2012    Family History  Problem Relation Age of Onset   Dementia Mother    High Cholesterol Mother    Hypertension Mother    Arthritis Mother    Hyperlipidemia Mother    Heart disease Father    Heart attack Father    Arthritis Maternal Grandmother    Cancer Maternal Grandmother        colon   Prostate cancer Brother    Cancer Brother        prostate   Cancer Maternal Uncle        colon   COPD Maternal Uncle    Stroke Paternal Grandmother    Diabetes Paternal Grandmother    Aneurysm Paternal Grandfather     Social History   Socioeconomic History   Marital status: Widowed    Spouse name: Regino Schultze   Number of  children: 2   Years of education: some college   Highest education level: 12th grade  Occupational History   Occupation: Retired    Comment: [employer]  Tobacco Use   Smoking status: Former    Packs/day: 1.00    Years: 40.00    Total pack years: 40.00    Types: Cigarettes    Quit date: 03/09/1997    Years since quitting: 24.5   Smokeless tobacco: Former   Tobacco comments:    smoking cessation materials not required  Vaping Use   Vaping Use: Never used  Substance and Sexual Activity   Alcohol use: No    Alcohol/week: 0.0 standard drinks of alcohol   Drug use: No   Sexual activity: Never  Other Topics Concern   Not on file  Social History Narrative   Pt lives alone   Social Determinants of Health   Financial Resource Strain: Low Risk  (09/14/2021)   Overall Financial Resource Strain (CARDIA)    Difficulty of Paying Living Expenses: Not hard at all  Food Insecurity: No Food Insecurity  (09/14/2021)   Hunger Vital Sign    Worried About Running Out of Food in the Last Year: Never true    [place] in the Last Year: Never true  Transportation Needs: No Transportation Needs (09/14/2021)   PRAPARE - Hydrologist (Medical): No    Lack of Transportation (Non-Medical): No  Physical Activity: Sufficiently Active (09/14/2021)   Exercise Vital Sign    Days of Exercise per Week: 6 days    Minutes of Exercise per Session: 40 min  Stress: No Stress Concern Present (09/14/2021)   [place]    Feeling of Stress : Not at all  Social Connections: Moderately Integrated (09/14/2021)   Social Connection and Isolation Panel [NHANES]    Frequency of Communication with Friends and Family: Three times a week    Frequency of Social Gatherings with Friends and Family: Three times a week    Attends Religious Services: 1 to 4 times per year    Active Member of Clubs or Organizations: No    Attends Archivist Meetings: 1 to 4 times per year    Marital Status: Widowed  Intimate Partner Violence: Not At Risk (09/14/2021)   Humiliation, Afraid, Rape, and Kick questionnaire    Fear of Current or Ex-Partner: No    Emotionally Abused: No    Physically Abused: No    Sexually Abused: No    Outpatient Encounter Medications as of 09/14/2021  Medication Sig   albuterol (VENTOLIN HFA) 108 (90 Base) MCG/ACT inhaler Inhale 2 puffs into the lungs every 6 (six) hours as needed for wheezing or shortness of breath.   aspirin EC 81 MG tablet Take 81 mg by mouth daily. Swallow whole.   Cholecalciferol (VITAMIN D3) 25 MCG (1000 UT) CAPS Take 1,000 Units by mouth daily.   Cyanocobalamin (VITAMIN B12) 500 MCG TABS Take 500 mcg by mouth daily.   metFORMIN (GLUCOPHAGE-XR) 500 MG 24 hr tablet TAKE 1 TABLET BY MOUTH  TWICE DAILY WITH A MEAL   REPATHA SURECLICK 371 MG/ML SOAJ INJECT '140MG'$  SUBCUTANEOUSLY  EVERY 2  WEEKS   Lancets (ONETOUCH DELICA PLUS [id number]) MISC USE TO CHECK BLOOD GLUCOSE  ONCE DAILY AS DIRECTED (Patient not taking: Reported on 09/14/2021)   ONETOUCH VERIO test strip CHECK FINGERSTICK BLOOD  SUGARS ONCE DAILY (Patient not taking: Reported on 09/14/2021)   [DISCONTINUED]  betamethasone acetate-betamethasone sodium phosphate (CELESTONE) injection 3 mg    No facility-administered encounter medications on file as of 09/14/2021.    Allergies  Allergen Reactions   Amoxicillin Swelling   Statins Other (See Comments)    Affects the liver   Welchol [Colesevelam Hcl] Other (See Comments)    affects the liver    Care Team Updated in EHR: Yes  Last Vision Exam: Jan 2023  Wears corrective lenses: Yes Last Dental Exam: Last month  Last Hearing Exam: Last year  Wears Hearing Aids: No  Functional Ability / Safety Screening 1.  Was the timed Get Up and Go test longer than 30 seconds?  no 2.  Does the patient need help with the phone, transportation, shopping,      preparing meals, housework, laundry, medications, or managing money?  no 3.  Does the patient's home have:  loose throw rugs in the hallway?   no      Grab bars in the bathroom? yes      Handrails on the stairs?   yes      Poor lighting?   no 4.  Has the patient noticed any hearing difficulties?   no  Diet Recall and Exercise Regimen: Diet:  Trying to follow diabetic diet  Exercise: He is walking frequently. Walking about 2 miles per day, everyday.   Fall Risk:  See screening under Objective Information  Depression Screen:  See screening under Objective Information  Advanced Directives: A voluntary discussion about advance care planning including the explanation and discussion of advance directives was discussed with the patient. Explanation about the health care proxy and living will was reviewed.  During this discussion, the patient was able to identify a health care proxy as No one and does not plan to fill out the  paperwork required and will bring this to our office to keep on file. Does patient have a HCPOA?    no If yes, name and contact information:  Does patient have a living will or MOST form?  no  Cancer Screenings: Skin: Has not seen a dermatologist in some time for screening. Ref ordered today.  Lung: Low Dose CT Chest recommended if Age 79-80 years, 30 pack-year currently smoking OR have quit w/in 15years. Patient does qualify.  Lifestyle risk factor issued reviewed: Diet, exercise, weight management, advised patient smoking is not healthy, nutrition/diet.  Ordered today  Prostate: Last PSA was 2018- normal results  Colon: Overdue since 2020. Should have every 5 years. Offered to order but patient declined.   Additional Screenings:  Hepatitis B/HIV/Syphillis: Hepatitis C Screening: Completed.  Intimate Partner Violence:  AAA Screen: Men age 7 to 53 years if ever smoked recommended to get a one time AAA ultrasound screening exam. Patient does qualify. Order placed today.     Nutrition Risk Assessment:  Has the patient had any N/V/D within the last 2 months?  No  Does the patient have any non-healing wounds?  No  Has the patient had any unintentional weight loss or weight gain?  No   Diabetes:  Is the patient diabetic?  Yes  If diabetic, was a CBG obtained today?  No  Did the patient bring in their glucometer from home?  No  How often do you monitor your CBG's? Checking once per day.   Financial Strains and Diabetes Management:  Are you having any financial strains with the device, your supplies or your medication? No .  Does the patient want to be seen by Chronic Care  Management for management of their diabetes?  Already being seen by CCM Would the patient like to be referred to a Nutritionist or for Diabetic Management?  Yes   Diabetic Exams:  Diabetic Eye Exam: Completed. Pt has been advised about the importance in completing this exam. A referral has been placed today.  Message sent to referral coordinator for scheduling purposes. Advised pt to expect a call from office referred to regarding appt.  Diabetic Foot Exam overdue. Pt has been advised about the importance in completing this exam.     Objective:   Vitals: BP 128/76   Pulse 100   Temp 98 F (36.7 C) (Oral)   Resp 16   Ht '5\' 7"'$  (1.702 m)   Wt 176 lb 12.8 oz (80.2 kg)   SpO2 98%   BMI 27.69 kg/m  Body mass index is 27.69 kg/m.  Lab Results  Component Value Date   CHOL 131 01/23/2021   CHOL 209 (H) 10/04/2020   CHOL 247 (H) 04/25/2020   Lab Results  Component Value Date   HDL 48 01/23/2021   HDL 49 10/04/2020   HDL 47 04/25/2020   Lab Results  Component Value Date   LDLCALC 50 01/23/2021   LDLCALC 133 (H) 10/04/2020   LDLCALC 168 (H) 04/25/2020   Lab Results  Component Value Date   TRIG 315 (H) 01/23/2021   TRIG 147 10/04/2020   TRIG 170 (H) 04/25/2020   Lab Results  Component Value Date   CHOLHDL 2.7 01/23/2021   CHOLHDL 4.3 10/04/2020   CHOLHDL 5.3 (H) 04/25/2020   No results found for: "LDLDIRECT"  No results found.  Physical Exam Vitals reviewed.  Constitutional:      General: He is awake.     Appearance: Normal appearance. He is well-developed, well-groomed and overweight.  HENT:     Head: Normocephalic and atraumatic.  Eyes:     General: Lids are normal.     Extraocular Movements: Extraocular movements intact.     Conjunctiva/sclera: Conjunctivae normal.     Pupils: Pupils are equal, round, and reactive to light.  Pulmonary:     Effort: Pulmonary effort is normal.  Musculoskeletal:     Cervical back: Normal range of motion.  Neurological:     General: No focal deficit present.     Mental Status: He is alert and oriented to person, place, and time. Mental status is at baseline.     GCS: GCS eye subscore is 4. GCS verbal subscore is 5. GCS motor subscore is 6.     Cranial Nerves: No dysarthria or facial asymmetry.     Motor: Motor function is  intact. No tremor or atrophy.     Gait: Gait is intact.  Psychiatric:        Attention and Perception: Attention normal.        Mood and Affect: Mood and affect normal.        Speech: Speech normal.        Behavior: Behavior normal. Behavior is cooperative.        Thought Content: Thought content normal.       Cognitive Testing - 6-CIT  Correct? Score   What year is it? yes 0 Yes = 0    No = 4  What month is it? yes 0 Yes = 0    No = 3  Remember:     [address]ia [address], [place], [place]     What time is it? yes 0 Yes =  0    No = 3  Count backwards from 20 to 1 yes 0 Correct = 0    1 error = 2   More than 1 error = 4  Say the months of the year in reverse. yes 1 Correct = 0    1 error = 2   More than 1 error = 4  What address did I ask you to remember? yes 2 Correct = 0  1 error = 2    2 error = 4    3 error = 6    4 error = 8    All wrong = 10       TOTAL SCORE  3/28   Interpretation:  Normal  Normal (0-7) Abnormal (8-28)   Fall Risk:    09/14/2021    1:30 PM 01/23/2021    1:06 PM 09/13/2020    1:44 PM 07/21/2020    2:14 PM 04/25/2020    2:51 PM  Fall Risk   Falls in the past year? 0 0 1 0 0  Number falls in past yr: 0 0 0 0 0  Injury with Fall? 0 0 0 0 0  Risk for fall due to :  No Fall Risks No Fall Risks    Follow up Falls evaluation completed Falls prevention discussed Falls prevention discussed      Depression Screen    09/14/2021    1:30 PM 01/23/2021    1:07 PM 09/13/2020    1:41 PM 07/21/2020    2:14 PM 04/25/2020    2:51 PM  Depression screen PHQ 2/9  Decreased Interest 0 0 0 0 0  Down, Depressed, Hopeless 0 0 0 0 0  PHQ - 2 Score 0 0 0 0 0  Altered sleeping  0  0 0  Tired, decreased energy  0  0 0  Change in appetite  0  0 0  Feeling bad or failure about yourself   0  0 0  Trouble concentrating  0  0 0  Moving slowly or fidgety/restless  0  0 0  Suicidal thoughts  0  0 0  PHQ-9 Score  0  0 0  Difficult doing work/chores  Not difficult at all  Not  difficult at all Not difficult at all    No results found for this or any previous visit (from the past 2160 hour(s)).   Assessment & Plan:     Skin cancer screening  Type 2 diabetes mellitus with hyperglycemia, without long-term current use of insulin (HCC)  Screening for lung cancer   Exercise Activities and Dietary recommendations  Goals      DIET - INCREASE WATER INTAKE     Recommend to drink at least 6-8 8oz glasses of water per day.     Monitor and Manage My Blood Sugar-Diabetes Type 2     Timeframe:  Long-Range Goal Priority:  High Start Date:    05/05/2020                         Expected End Date:   03/19/2022                    Follow Up Date 01/22/2021     - check blood sugar daily before breakfast - check blood sugar if I feel it is too high or too low    Why is this important?   Checking your blood sugar at home helps  to keep it from getting very high or very low.  Writing the results in a diary or log helps the doctor know how to care for you.  Your blood sugar log should have the time, date and the results.  Also, write down the amount of insulin or other medicine that you take.  Other information, like what you ate, exercise done and how you were feeling, will also be helpful.     Notes:      Patient Stated     Walk 10,000 steps daily       Discussed health benefits of physical activity, and encouraged him to engage in regular exercise appropriate for his age and condition.   Immunization History  Administered Date(s) Administered   Fluad Quad(high Dose 65+) 01/20/2019, 04/25/2020, 01/23/2021   Influenza, High Dose Seasonal PF 01/10/2017, 01/13/2018   Influenza-Unspecified 01/10/2012, 01/01/2013, 01/01/2015   Janssen (J&J) SARS-COV-2 Vaccination 09/08/2019   Pneumococcal Conjugate-13 07/30/2013   Pneumococcal Polysaccharide-23 02/27/2011   Td 03/04/2014   Zoster Recombinat (Shingrix) 07/01/2016, 01/31/2017   Zoster, Live 02/17/2014     Health Maintenance  Topic Date Due   COLONOSCOPY (Pts 45-26yr Insurance coverage will need to be confirmed)  10/01/2018   COVID-19 Vaccine (2 - Janssen risk series) 10/06/2019   FOOT EXAM  04/25/2021   URINE MICROALBUMIN  04/25/2021   HEMOGLOBIN A1C  07/23/2021   INFLUENZA VACCINE  10/24/2021   OPHTHALMOLOGY EXAM  01/31/2022   TETANUS/TDAP  03/04/2024   Pneumonia Vaccine 76 Years old  Completed   Hepatitis C Screening  Completed   Zoster Vaccines- Shingrix  Completed   HPV VACCINES  Aged Out     No orders of the defined types were placed in this encounter.   Current Outpatient Medications:    albuterol (VENTOLIN HFA) 108 (90 Base) MCG/ACT inhaler, Inhale 2 puffs into the lungs every 6 (six) hours as needed for wheezing or shortness of breath., Disp: 8 g, Rfl: 0   aspirin EC 81 MG tablet, Take 81 mg by mouth daily. Swallow whole., Disp: , Rfl:    Cholecalciferol (VITAMIN D3) 25 MCG (1000 UT) CAPS, Take 1,000 Units by mouth daily., Disp: , Rfl:    Cyanocobalamin (VITAMIN B12) 500 MCG TABS, Take 500 mcg by mouth daily., Disp: , Rfl:    metFORMIN (GLUCOPHAGE-XR) 500 MG 24 hr tablet, TAKE 1 TABLET BY MOUTH  TWICE DAILY WITH A MEAL, Disp: 180 tablet, Rfl: 3   REPATHA SURECLICK 1518MG/ML SOAJ, INJECT '140MG'$  SUBCUTANEOUSLY  EVERY 2 WEEKS, Disp: 6 mL, Rfl: 2   Lancets (ONETOUCH DELICA PLUS [id number] MISC, USE TO CHECK BLOOD GLUCOSE  ONCE DAILY AS DIRECTED (Patient not taking: Reported on 09/14/2021), Disp: 100 each, Rfl: 2   ONETOUCH VERIO test strip, CHECK FINGERSTICK BLOOD  SUGARS ONCE DAILY (Patient not taking: Reported on 09/14/2021), Disp: 100 strip, Rfl: 2 Medications Discontinued During This Encounter  Medication Reason   betamethasone acetate-betamethasone sodium phosphate (CELESTONE) injection 3 mg     I have personally reviewed and addressed the Medicare Annual Wellness health risk assessment questionnaire and have noted the following in the patient's chart:  A.          Medical and social history & family history B.         Use of alcohol, tobacco or illicit drugs  C.         Current medications and supplements D.         Functional and Cognitive ability and status E.  Nutritional status F.         Physical activity G.        Advance directives H.         List of other physicians I.          Hospitalizations, surgeries, and ER visits in previous 12 months J.         [place] such as hearing and vision if needed, cognitive and depression L.         Referrals and appointments -   In addition, I have reviewed and discussed with patient certain preventive protocols, quality metrics, and best practice recommendations. A written personalized care plan for preventive services as well as general preventive health recommendations were provided to patient.   See attached scanned questionnaire for additional information.   Return in about 1 year (around 09/15/2022) for AWV.    Talitha Givens, MHS, PA-C [facility]

## 2021-09-14 NOTE — Patient Instructions (Addendum)
Thank you for taking time to come for your Medicare Wellness Visit. I appreciate your ongoing commitment to your health goals. Please review the following plan we discussed and let me know if I can assist you in the future.   Screening recommendations/referrals: Colonoscopy: It is recommended that you complete this every 5 years. Please let us know if you'd like to schedule this and we would be happy to assist.  Recommended yearly ophthalmology/optometry visit for glaucoma screening and checkup Recommended yearly dental visit for hygiene and checkup  Vaccinations: Influenza vaccine: Due in the Fall.  Pneumococcal vaccine: Completed Tdap vaccine: Due in 2025 Shingles vaccine:  completed  Covid-19: Completed  Advanced directives: Still needs to be completed. I have attached information on this to your AVS. Please review and discuss with your PCP  I have placed a referral to Diabetes and Nutrition services. The Referral team should reach out soon to help coordinate this apt.  I have also placed a referral to Dermatology and for your lung cancer screening. Please let us know if you have not heard anything to set these up in the next 2 weeks.   Next appointment: Follow up in one year for your annual wellness visit.   Preventive Care 77 Years and Older, Male  Preventive care refers to lifestyle choices and visits with your health care provider that can promote health and wellness. What does preventive care include? A yearly physical exam. This is also called an annual well check. Dental exams once or twice a year. Routine eye exams. Ask your health care provider how often you should have your eyes checked. Personal lifestyle choices, including: Daily care of your teeth and gums. Regular physical activity. Eating a healthy diet. Avoiding tobacco and drug use. Limiting alcohol use. Practicing safe sex. Taking low doses of aspirin every day. Taking vitamin and mineral supplements as  recommended by your health care provider. What happens during an annual well check? The services and screenings done by your health care provider during your annual well check will depend on your age, overall health, lifestyle risk factors, and family history of disease. Counseling  Your health care provider may ask you questions about your: Alcohol use. Tobacco use. Drug use. Emotional well-being. Home and relationship well-being. Sexual activity. Eating habits. History of falls. Memory and ability to understand (cognition). Work and work Statistician. Screening  You may have the following tests or measurements: Height, weight, and BMI. Blood pressure. Lipid and cholesterol levels. These may be checked every 5 years, or more frequently if you are over 55 years old. Skin check. Lung cancer screening. You may have this screening every year starting at age 77 if you have a 30-pack-year history of smoking and currently smoke or have quit within the past 15 years. Fecal occult blood test (FOBT) of the stool. You may have this test every year starting at age 77. Flexible sigmoidoscopy or colonoscopy. You may have a sigmoidoscopy every 5 years or a colonoscopy every 10 years starting at age 77. Prostate cancer screening. Recommendations will vary depending on your family history and other risks. Hepatitis C blood test. Hepatitis B blood test. Sexually transmitted disease (STD) testing. Diabetes screening. This is done by checking your blood sugar (glucose) after you have not eaten for a while (fasting). You may have this done every 1-3 years. Abdominal aortic aneurysm (AAA) screening. You may need this if you are a current or former smoker. Osteoporosis. You may be screened starting at age 60 if  you are at high risk. Talk with your health care provider about your test results, treatment options, and if necessary, the need for more tests. Vaccines  Your health care provider may recommend  certain vaccines, such as: Influenza vaccine. This is recommended every year. Tetanus, diphtheria, and acellular pertussis (Tdap, Td) vaccine. You may need a Td booster every 10 years. Zoster vaccine. You may need this after age 77. Pneumococcal 13-valent conjugate (PCV13) vaccine. One dose is recommended after age 77. Pneumococcal polysaccharide (PPSV23) vaccine. One dose is recommended after age 19. Talk to your health care provider about which screenings and vaccines you need and how often you need them. This information is not intended to replace advice given to you by your health care provider. Make sure you discuss any questions you have with your health care provider. Document Released: 04/08/2015 Document Revised: 11/30/2015 Document Reviewed: 01/11/2015 Elsevier Interactive Patient Education  2017 [place] Prevention in the Home Falls can cause injuries. They can happen to people of all ages. There are many things you can do to make your home safe and to help prevent falls. What can I do on the outside of my home? Regularly fix the edges of walkways and driveways and fix any cracks. Remove anything that might make you trip as you walk through a door, such as a raised step or threshold. Trim any bushes or trees on the path to your home. Use bright outdoor lighting. Clear any walking paths of anything that might make someone trip, such as rocks or tools. Regularly check to see if handrails are loose or broken. Make sure that both sides of any steps have handrails. Any raised decks and porches should have guardrails on the edges. Have any leaves, snow, or ice cleared regularly. Use sand or salt on walking paths during winter. Clean up any spills in your garage right away. This includes oil or grease spills. What can I do in the bathroom? Use night lights. Install grab bars by the toilet and in the tub and shower. Do not use towel bars as grab bars. Use non-skid mats or  decals in the tub or shower. If you need to sit down in the shower, use a plastic, non-slip stool. Keep the floor dry. Clean up any water that spills on the floor as soon as it happens. Remove soap buildup in the tub or shower regularly. Attach bath mats securely with double-sided non-slip rug tape. Do not have throw rugs and other things on the floor that can make you trip. What can I do in the bedroom? Use night lights. Make sure that you have a light by your bed that is easy to reach. Do not use any sheets or blankets that are too big for your bed. They should not hang down onto the floor. Have a firm chair that has side arms. You can use this for support while you get dressed. Do not have throw rugs and other things on the floor that can make you trip. What can I do in the kitchen? Clean up any spills right away. Avoid walking on wet floors. Keep items that you use a lot in easy-to-reach places. If you need to reach something above you, use a strong step stool that has a grab bar. Keep electrical cords out of the way. Do not use floor polish or wax that makes floors slippery. If you must use wax, use non-skid floor wax. Do not have throw rugs and other things on  the floor that can make you trip. What can I do with my stairs? Do not leave any items on the stairs. Make sure that there are handrails on both sides of the stairs and use them. Fix handrails that are broken or loose. Make sure that handrails are as long as the stairways. Check any carpeting to make sure that it is firmly attached to the stairs. Fix any carpet that is loose or worn. Avoid having throw rugs at the top or bottom of the stairs. If you do have throw rugs, attach them to the floor with carpet tape. Make sure that you have a light switch at the top of the stairs and the bottom of the stairs. If you do not have them, ask someone to add them for you. What else can I do to help prevent falls? Wear shoes that: Do not  have high heels. Have rubber bottoms. Are comfortable and fit you well. Are closed at the toe. Do not wear sandals. If you use a stepladder: Make sure that it is fully opened. Do not climb a closed stepladder. Make sure that both sides of the stepladder are locked into place. Ask someone to hold it for you, if possible. Clearly mark and make sure that you can see: Any grab bars or handrails. First and last steps. Where the edge of each step is. Use tools that help you move around (mobility aids) if they are needed. These include: Canes. Walkers. Scooters. Crutches. Turn on the lights when you go into a dark area. Replace any light bulbs as soon as they burn out. Set up your furniture so you have a clear path. Avoid moving your furniture around. If any of your floors are uneven, fix them. If there are any pets around you, be aware of where they are. Review your medicines with your doctor. Some medicines can make you feel dizzy. This can increase your chance of falling. Ask your doctor what other things that you can do to help prevent falls. This information is not intended to replace advice given to you by your health care provider. Make sure you discuss any questions you have with your health care provider. Document Released: 01/06/2009 Document Revised: 08/18/2015 Document Reviewed: 04/16/2014 Elsevier Interactive Patient Education  2017 [employer].

## 2021-09-17 ENCOUNTER — Other Ambulatory Visit: Payer: Self-pay | Admitting: Family Medicine

## 2021-09-17 DIAGNOSIS — E119 Type 2 diabetes mellitus without complications: Secondary | ICD-10-CM

## 2021-10-05 ENCOUNTER — Observation Stay: Payer: Medicare Other

## 2021-10-05 ENCOUNTER — Other Ambulatory Visit: Payer: Self-pay

## 2021-10-05 ENCOUNTER — Emergency Department: Payer: Medicare Other

## 2021-10-05 ENCOUNTER — Inpatient Hospital Stay
Admission: EM | Admit: 2021-10-05 | Discharge: 2021-10-09 | DRG: 065 | Disposition: A | Discharge status: Rehabilitation Facility | Payer: Medicare Other | Attending: General Practice | Admitting: General Practice

## 2021-10-05 DIAGNOSIS — R4701 Aphasia: Secondary | ICD-10-CM | POA: Diagnosis present

## 2021-10-05 DIAGNOSIS — E119 Type 2 diabetes mellitus without complications: Secondary | ICD-10-CM

## 2021-10-05 DIAGNOSIS — I700 Atherosclerosis of aorta: Secondary | ICD-10-CM | POA: Diagnosis present

## 2021-10-05 DIAGNOSIS — Z8249 Family history of ischemic heart disease and other diseases of the circulatory system: Secondary | ICD-10-CM

## 2021-10-05 DIAGNOSIS — R4182 Altered mental status, unspecified: Secondary | ICD-10-CM | POA: Diagnosis not present

## 2021-10-05 DIAGNOSIS — I63512 Cerebral infarction due to unspecified occlusion or stenosis of left middle cerebral artery: Secondary | ICD-10-CM | POA: Diagnosis not present

## 2021-10-05 DIAGNOSIS — G8191 Hemiplegia, unspecified affecting right dominant side: Secondary | ICD-10-CM | POA: Diagnosis present

## 2021-10-05 DIAGNOSIS — Z8042 Family history of malignant neoplasm of prostate: Secondary | ICD-10-CM

## 2021-10-05 DIAGNOSIS — Z8261 Family history of arthritis: Secondary | ICD-10-CM

## 2021-10-05 DIAGNOSIS — E1159 Type 2 diabetes mellitus with other circulatory complications: Secondary | ICD-10-CM

## 2021-10-05 DIAGNOSIS — R4702 Dysphasia: Secondary | ICD-10-CM | POA: Diagnosis present

## 2021-10-05 DIAGNOSIS — Z833 Family history of diabetes mellitus: Secondary | ICD-10-CM

## 2021-10-05 DIAGNOSIS — I639 Cerebral infarction, unspecified: Secondary | ICD-10-CM | POA: Diagnosis present

## 2021-10-05 DIAGNOSIS — M19042 Primary osteoarthritis, left hand: Secondary | ICD-10-CM | POA: Diagnosis present

## 2021-10-05 DIAGNOSIS — Z8546 Personal history of malignant neoplasm of prostate: Secondary | ICD-10-CM

## 2021-10-05 DIAGNOSIS — E1151 Type 2 diabetes mellitus with diabetic peripheral angiopathy without gangrene: Secondary | ICD-10-CM | POA: Diagnosis present

## 2021-10-05 DIAGNOSIS — Z880 Allergy status to penicillin: Secondary | ICD-10-CM

## 2021-10-05 DIAGNOSIS — K219 Gastro-esophageal reflux disease without esophagitis: Secondary | ICD-10-CM | POA: Diagnosis present

## 2021-10-05 DIAGNOSIS — H02401 Unspecified ptosis of right eyelid: Secondary | ICD-10-CM | POA: Diagnosis present

## 2021-10-05 DIAGNOSIS — Z20822 Contact with and (suspected) exposure to covid-19: Secondary | ICD-10-CM | POA: Diagnosis present

## 2021-10-05 DIAGNOSIS — E785 Hyperlipidemia, unspecified: Secondary | ICD-10-CM | POA: Diagnosis present

## 2021-10-05 DIAGNOSIS — M19041 Primary osteoarthritis, right hand: Secondary | ICD-10-CM | POA: Diagnosis present

## 2021-10-05 DIAGNOSIS — Y92239 Unspecified place in hospital as the place of occurrence of the external cause: Secondary | ICD-10-CM | POA: Diagnosis present

## 2021-10-05 DIAGNOSIS — T466X5A Adverse effect of antihyperlipidemic and antiarteriosclerotic drugs, initial encounter: Secondary | ICD-10-CM | POA: Diagnosis present

## 2021-10-05 DIAGNOSIS — I6529 Occlusion and stenosis of unspecified carotid artery: Secondary | ICD-10-CM | POA: Diagnosis present

## 2021-10-05 DIAGNOSIS — I6523 Occlusion and stenosis of bilateral carotid arteries: Secondary | ICD-10-CM | POA: Diagnosis not present

## 2021-10-05 DIAGNOSIS — I672 Cerebral atherosclerosis: Secondary | ICD-10-CM | POA: Diagnosis present

## 2021-10-05 DIAGNOSIS — Z87891 Personal history of nicotine dependence: Secondary | ICD-10-CM

## 2021-10-05 DIAGNOSIS — G720 Drug-induced myopathy: Secondary | ICD-10-CM | POA: Diagnosis present

## 2021-10-05 DIAGNOSIS — Z9049 Acquired absence of other specified parts of digestive tract: Secondary | ICD-10-CM

## 2021-10-05 DIAGNOSIS — Z7984 Long term (current) use of oral hypoglycemic drugs: Secondary | ICD-10-CM

## 2021-10-05 DIAGNOSIS — R29713 NIHSS score 13: Secondary | ICD-10-CM | POA: Diagnosis present

## 2021-10-05 DIAGNOSIS — Z83438 Family history of other disorder of lipoprotein metabolism and other lipidemia: Secondary | ICD-10-CM

## 2021-10-05 DIAGNOSIS — R29810 Facial weakness: Secondary | ICD-10-CM | POA: Diagnosis present

## 2021-10-05 DIAGNOSIS — Z825 Family history of asthma and other chronic lower respiratory diseases: Secondary | ICD-10-CM

## 2021-10-05 DIAGNOSIS — E1169 Type 2 diabetes mellitus with other specified complication: Secondary | ICD-10-CM | POA: Diagnosis present

## 2021-10-05 DIAGNOSIS — Z79899 Other long term (current) drug therapy: Secondary | ICD-10-CM

## 2021-10-05 DIAGNOSIS — Z7982 Long term (current) use of aspirin: Secondary | ICD-10-CM

## 2021-10-05 DIAGNOSIS — I152 Hypertension secondary to endocrine disorders: Secondary | ICD-10-CM | POA: Diagnosis present

## 2021-10-05 DIAGNOSIS — Z9079 Acquired absence of other genital organ(s): Secondary | ICD-10-CM

## 2021-10-05 DIAGNOSIS — Z823 Family history of stroke: Secondary | ICD-10-CM

## 2021-10-05 DIAGNOSIS — I10 Essential (primary) hypertension: Secondary | ICD-10-CM | POA: Diagnosis present

## 2021-10-05 DIAGNOSIS — I6522 Occlusion and stenosis of left carotid artery: Secondary | ICD-10-CM | POA: Diagnosis present

## 2021-10-05 DIAGNOSIS — R130 Aphagia: Secondary | ICD-10-CM | POA: Diagnosis not present

## 2021-10-05 DIAGNOSIS — Z888 Allergy status to other drugs, medicaments and biological substances status: Secondary | ICD-10-CM

## 2021-10-05 DIAGNOSIS — J449 Chronic obstructive pulmonary disease, unspecified: Secondary | ICD-10-CM | POA: Diagnosis present

## 2021-10-05 LAB — URINE DRUG SCREEN, QUALITATIVE (ARMC ONLY)
Amphetamines, Ur Screen: NOT DETECTED
Barbiturates, Ur Screen: NOT DETECTED
Benzodiazepine, Ur Scrn: NOT DETECTED
Cannabinoid 50 Ng, Ur ~~LOC~~: NOT DETECTED
Cocaine Metabolite,Ur ~~LOC~~: NOT DETECTED
MDMA (Ecstasy)Ur Screen: NOT DETECTED
Methadone Scn, Ur: NOT DETECTED
Opiate, Ur Screen: NOT DETECTED
Phencyclidine (PCP) Ur S: NOT DETECTED
Tricyclic, Ur Screen: NOT DETECTED

## 2021-10-05 LAB — CBC
HCT: 43 % (ref 39.0–52.0)
Hemoglobin: 14.4 g/dL (ref 13.0–17.0)
MCH: 30.1 pg (ref 26.0–34.0)
MCHC: 33.5 g/dL (ref 30.0–36.0)
MCV: 90 fL (ref 80.0–100.0)
Platelets: 207 10*3/uL (ref 150–400)
RBC: 4.78 MIL/uL (ref 4.22–5.81)
RDW: 12.4 % (ref 11.5–15.5)
WBC: 7.9 10*3/uL (ref 4.0–10.5)
nRBC: 0 % (ref 0.0–0.2)

## 2021-10-05 LAB — COMPREHENSIVE METABOLIC PANEL
ALT: 16 U/L (ref 0–44)
AST: 19 U/L (ref 15–41)
Albumin: 3.8 g/dL (ref 3.5–5.0)
Alkaline Phosphatase: 47 U/L (ref 38–126)
Anion gap: 8 (ref 5–15)
BUN: 12 mg/dL (ref 8–23)
CO2: 23 mmol/L (ref 22–32)
Calcium: 8.3 mg/dL — ABNORMAL LOW (ref 8.9–10.3)
Chloride: 106 mmol/L (ref 98–111)
Creatinine, Ser: 0.9 mg/dL (ref 0.61–1.24)
GFR, Estimated: >60 mL/min (ref >60)
Glucose, Bld: 119 mg/dL — ABNORMAL HIGH (ref 70–99)
Potassium: 3.8 mmol/L (ref 3.5–5.1)
Sodium: 137 mmol/L (ref 135–145)
Total Bilirubin: 0.5 mg/dL (ref 0.3–1.2)
Total Protein: 6.5 g/dL (ref 6.5–8.1)

## 2021-10-05 LAB — RESP PANEL BY RT-PCR (FLU A&B, COVID) ARPGX2
Influenza A by PCR: NEGATIVE
Influenza B by PCR: NEGATIVE
SARS Coronavirus 2 by RT PCR: NEGATIVE

## 2021-10-05 LAB — URINALYSIS, ROUTINE W REFLEX MICROSCOPIC
Bilirubin Urine: NEGATIVE
Glucose, UA: NEGATIVE mg/dL
Hgb urine dipstick: NEGATIVE
Ketones, ur: 5 mg/dL — AB
Leukocytes,Ua: NEGATIVE
Nitrite: NEGATIVE
Protein, ur: NEGATIVE mg/dL
Specific Gravity, Urine: 1.004 — ABNORMAL LOW (ref 1.005–1.030)
pH: 6 (ref 5.0–8.0)

## 2021-10-05 LAB — DIFFERENTIAL
Abs Immature Granulocytes: 0.03 10*3/uL (ref 0.00–0.07)
Basophils Absolute: 0.1 10*3/uL (ref 0.0–0.1)
Basophils Relative: 1 %
Eosinophils Absolute: 0.1 10*3/uL (ref 0.0–0.5)
Eosinophils Relative: 2 %
Immature Granulocytes: 0 %
Lymphocytes Relative: 31 %
Lymphs Abs: 2.4 10*3/uL (ref 0.7–4.0)
Monocytes Absolute: 0.5 10*3/uL (ref 0.1–1.0)
Monocytes Relative: 7 %
Neutro Abs: 4.7 10*3/uL (ref 1.7–7.7)
Neutrophils Relative %: 59 %

## 2021-10-05 LAB — CBG MONITORING, ED: Glucose-Capillary: 153 mg/dL — ABNORMAL HIGH (ref 70–99)

## 2021-10-05 LAB — PROTIME-INR
INR: 1.1 (ref 0.8–1.2)
Prothrombin Time: 14.1 seconds (ref 11.4–15.2)

## 2021-10-05 LAB — ETHANOL: Alcohol, Ethyl (B): <10 mg/dL (ref <10)

## 2021-10-05 LAB — APTT: aPTT: 29 seconds (ref 24–36)

## 2021-10-05 MED ORDER — ACETAMINOPHEN 650 MG RE SUPP: 650.0000 mg | RECTAL | Status: DC | PRN

## 2021-10-05 MED ORDER — ASPIRIN 81 MG PO TBEC
81.0000 mg | DELAYED_RELEASE_TABLET | Freq: Every day | ORAL | Status: DC
  Administered 2021-10-06 – 2021-10-09 (×4): 81 mg via ORAL
  Filled 2021-10-05 (×4): qty 1

## 2021-10-05 MED ORDER — ACETAMINOPHEN 160 MG/5ML PO SOLN: 650.0000 mg | ORAL | Status: DC | PRN

## 2021-10-05 MED ORDER — GADOBUTROL 1 MMOL/ML IV SOLN
7.0000 mL | Freq: Once | INTRAVENOUS | Status: AC | PRN
  Administered 2021-10-05: 7 mL via INTRAVENOUS

## 2021-10-05 MED ORDER — ENOXAPARIN SODIUM 40 MG/0.4ML IJ SOSY
40.0000 mg | PREFILLED_SYRINGE | INTRAMUSCULAR | Status: DC
  Administered 2021-10-06 – 2021-10-09 (×4): 40 mg via SUBCUTANEOUS
  Filled 2021-10-05 (×4): qty 0.4

## 2021-10-05 MED ORDER — INSULIN ASPART 100 UNIT/ML IJ SOLN
0.0000 [IU] | Freq: Every day | INTRAMUSCULAR | Status: DC
  Administered 2021-10-07: 2 [IU] via SUBCUTANEOUS
  Administered 2021-10-08: 3 [IU] via SUBCUTANEOUS
  Filled 2021-10-05 (×2): qty 1

## 2021-10-05 MED ORDER — INSULIN ASPART 100 UNIT/ML IJ SOLN
0.0000 [IU] | Freq: Three times a day (TID) | INTRAMUSCULAR | Status: DC
  Administered 2021-10-06 (×2): 3 [IU] via SUBCUTANEOUS
  Administered 2021-10-06: 2 [IU] via SUBCUTANEOUS
  Administered 2021-10-07: 3 [IU] via SUBCUTANEOUS
  Administered 2021-10-07: 2 [IU] via SUBCUTANEOUS
  Administered 2021-10-07 – 2021-10-08 (×2): 5 [IU] via SUBCUTANEOUS
  Administered 2021-10-08 – 2021-10-09 (×4): 3 [IU] via SUBCUTANEOUS
  Filled 2021-10-05 (×11): qty 1

## 2021-10-05 MED ORDER — LABETALOL HCL 5 MG/ML IV SOLN: 5.0000 mg | INTRAVENOUS | Status: DC | PRN

## 2021-10-05 MED ORDER — STROKE: EARLY STAGES OF RECOVERY BOOK: Freq: Once | Status: DC

## 2021-10-05 MED ORDER — VITAMIN B-12 1000 MCG PO TABS
500.0000 ug | ORAL_TABLET | Freq: Every day | ORAL | Status: DC
  Administered 2021-10-06 – 2021-10-09 (×4): 500 ug via ORAL
  Filled 2021-10-05 (×2): qty 1
  Filled 2021-10-05: qty 0.5
  Filled 2021-10-05: qty 1

## 2021-10-05 MED ORDER — ASPIRIN 325 MG PO TABS
325.0000 mg | ORAL_TABLET | Freq: Every day | ORAL | Status: DC
  Filled 2021-10-05: qty 1

## 2021-10-05 MED ORDER — SENNOSIDES-DOCUSATE SODIUM 8.6-50 MG PO TABS
1.0000 | ORAL_TABLET | Freq: Every evening | ORAL | Status: DC | PRN
Start: 2021-10-05 — End: 2021-10-09

## 2021-10-05 MED ORDER — VITAMIN D 25 MCG (1000 UNIT) PO TABS
1000.0000 [IU] | ORAL_TABLET | Freq: Every day | ORAL | Status: DC
  Administered 2021-10-06 – 2021-10-09 (×4): 1000 [IU] via ORAL
  Filled 2021-10-05 (×4): qty 1

## 2021-10-05 MED ORDER — ACETAMINOPHEN 325 MG PO TABS
650.0000 mg | ORAL_TABLET | ORAL | Status: DC | PRN
  Administered 2021-10-05 – 2021-10-08 (×2): 650 mg via ORAL
  Filled 2021-10-05 (×2): qty 2

## 2021-10-05 MED ORDER — ALBUTEROL SULFATE (2.5 MG/3ML) 0.083% IN NEBU: 2.5000 mg | INHALATION_SOLUTION | Freq: Four times a day (QID) | RESPIRATORY_TRACT | Status: DC | PRN

## 2021-10-05 NOTE — ED Notes (Signed)
Pt to mri at this time

## 2021-10-05 NOTE — ED Notes (Signed)
MRI called at this time to get an update of when pt will be going

## 2021-10-05 NOTE — H&P (Addendum)
History and Physical   Gerald Bauer XNA:[id number] DOB: 1945-06-04 DOA: 10/05/2021  PCP: Gerald Grana, PA-C  Outpatient Specialists: Dr. Lucky Bauer, vascular Patient coming from: Home via EMS  I have personally briefly reviewed patient's old medical records in [place].  Chief Concern: Right-sided facial droop and expressive aphasia  HPI: Gerald Bauer is a 76 year old male with history of left-sided carotid stenosis, non-insulin-dependent diabetes mellitus type 2, PAD, GERD, mild COPD, history of tobacco use, hyperlipidemia, hypertension, who presents emergency department for chief concerns of facial droop on the left side, expressive aphasia.  Per ED, last known normal was on 10/04/2021 at approximately 1530.  Initial vitals in the emergency department showed temperature of 98, respiration rate of 24, heart rate of 104, blood pressure 180/101, SPO2 of 99% on room air.  Serum sodium is 137, potassium 3.8, chloride 106, bicarb 23, BUN of 12, serum creatinine of 0.90, GFR greater than 60, nonfasting blood glucose 119, WBC 7.9, hemoglobin 14.4, platelets of 207.  UA was negative for leukocytes and nitrates.  COVID/influenza A/influenza B PCR were negative.  EtOH level was negative.  UDS is pending.  CT of the head was read as mild hypoattenuation in the region of the inferior left frontal lobe and anterior left insula.  This may be artifactual given suspected streak artifact in this region however if there is concern for left MCA territory infarct, recommend MRI.  ED treatment aspirin 325 milligram one-time dose.  At bedside patient was awake and alert and able to follow my commands by smiling, sticking out his tongue, squeezing my hands with his left hands and flexing and extending his knee and hip on the left side with 5 out of 5 strength.  Right lips droop.  Tongue deviates to the right.  Patient was not able to move his right upper extremity or right lower extremities.  He tried to  bend his right knee and I can see that he is exerting and trying very hard but was not able to do so.  Per family at bedside, patient's last known normal was 6 PM/1800 on 10/04/2021.  Patient's son called patient at approximately 1 PM on day of admission and patient was mumbling which alerted son to call EMS.  Patient shakes his head when I ask him if he had trouble walking.  However I asked him to move his right lower extremity including flexion and extension of his knee and he was unable to do so despite clear effort.  Social history: He lives at home by himself.  He is a widower since 2017.  He is a former tobacco user quitting in 1996.  Formally he smoked about 40 years and at his peak he was smoking 1.5 packs/day.  Family denies EtOH and recreational drug use.  ROS: Unable to complete as patient is currently nonverbal  ED Course: Discussed with emergency medicine provider, patient requiring hospitalization for chief concerns of stroke/TIA.  Assessment/Plan  Principal Problem:   Aphasia Active Problems:   Hyperlipidemia associated with type 2 diabetes mellitus ([place])   Hypertension associated with type 2 diabetes mellitus (H[facility])   Carotid stenosis   GERD (gastroesophageal reflux disease)   Statin myopathy   Aortic atherosclerosis ([facility])   Type 2 diabetes mellitus without complication, without long-term current use of insulin ([facility])   Assessment and Plan:  * Aphasia Stroke-like symptoms - AM team to consult neurology - Complete echo ordered - MRI of the brain ordered and portable stat chest x-ray ordered -  Fasting lipid and A1c ordered - Permissive hypertension with labetalol 5 mg IV every 3 hours as needed for SBP greater than 190, 4 doses ordered - Frequent neuro vascular checks - N.p.o. pending swallow study - PT, OT, SLP - Fall precaution  Type 2 diabetes mellitus without complication, without long-term current use of insulin ([place]) - Holding home metformin on admission -  Insulin SSI with at bedtime coverage ordered - Goal inpatient blood glucose levels 140-180  Carotid stenosis - MRA of the head and neck ordered - A.m. team to consult vascular if indicated  Hyperlipidemia associated with type 2 diabetes mellitus ([place]) - Intolerant to statin - Patient is on Repatha injections subcutaneous every 2 weeks - Lipid panel in the a.m.  AM team to complete med reconciliation  Chart reviewed.   DVT prophylaxis: Enoxaparin Code Status: Full code Diet: N.p.o. pending swallow study Family Communication: Updated family at bedside including Gerald Bauer, son Gerald Bauer, and daughter in law Gerald Bauer with patient nodding his permission. Disposition Plan: Pending clinical course Consults called: None at this time, patient would benefit from neurology consultation prior to discharge Admission status: Telemetry medical, observation  Past Medical History:  Diagnosis Date   Acid reflux    Arthritis    hands   Carotid atherosclerosis    Diabetes mellitus without complication ([place])    Hyperlipemia    Hypertension    Prostate cancer ([place])    history   Tachycardia    Past Surgical History:  Procedure Laterality Date   [place]  2013   PROSTATECTOMY  2012   Social History:  reports that he quit smoking about 24 years ago. His smoking use included cigarettes. He has a 40.00 pack-year smoking history. He has quit using smokeless tobacco. He reports that he does not drink alcohol and does not use drugs.  Allergies  Allergen Reactions   Amoxicillin Swelling   Statins Other (See Comments)    Affects the liver   Welchol [Colesevelam Hcl] Other (See Comments)    affects the liver   Family History  Problem Relation Age of Onset   Dementia Mother    High Cholesterol Mother    Hypertension Mother    Arthritis Mother    Hyperlipidemia Mother    Heart disease Father    Heart attack Father    Arthritis Maternal Grandmother     Cancer Maternal Grandmother        colon   Prostate cancer Brother    Cancer Brother        prostate   Cancer Maternal Uncle        colon   COPD Maternal Uncle    Stroke Paternal Grandmother    Diabetes Paternal Grandmother    Aneurysm Paternal Grandfather    Family history: Family history reviewed and pertinent stroke in paternal grandmother.  Prior to Admission medications   Medication Sig Start Date End Date Taking? Authorizing Provider  albuterol (VENTOLIN HFA) 108 (90 Base) MCG/ACT inhaler Inhale 2 puffs into the lungs every 6 (six) hours as needed for wheezing or shortness of breath. 03/10/21   [name], MD  aspirin EC 81 MG tablet Take 81 mg by mouth daily. Swallow whole.    [provider]  Cholecalciferol (VITAMIN D3) 25 MCG (1000 UT) CAPS Take 1,000 Units by mouth daily.    [provider]  Cyanocobalamin (VITAMIN B12) 500 MCG TABS Take 500 mcg by mouth daily.    [provider]  Lancets (ONETOUCH DELICA PLUS [id number]) MISC USE TO CHECK BLOOD GLUCOSE  ONCE DAILY AS DIRECTED Patient not taking: Reported on 09/14/2021 08/17/21   Gerald Grana, PA-C  metFORMIN (GLUCOPHAGE-XR) 500 MG 24 hr tablet TAKE 1 TABLET BY MOUTH  TWICE DAILY WITH A MEAL 09/18/21   Gerald Grana, PA-C  ONETOUCH VERIO test strip CHECK FINGERSTICK BLOOD  SUGARS ONCE DAILY Patient not taking: Reported on 09/14/2021 08/17/21   Gerald Grana, PA-C  REPATHA SURECLICK 629 MG/ML SOAJ INJECT '140MG'$  SUBCUTANEOUSLY  EVERY 2 WEEKS 04/13/21   [name], MD   Physical Exam: Vitals:   10/05/21 1405 10/05/21 1530 10/05/21 1630 10/05/21 1700  BP:  (!) 166/104 (!) 169/104 (!) 177/91  Pulse:  97 (!) 105 (!) 107  Resp:   16 15  Temp:      TempSrc:      SpO2:  99% 97% 95%  Weight: 79.8 kg     Height: '5\' 7"'$  (1.702 m)      Constitutional: appears age-appropriate, NAD, calm, comfortable Eyes: PERRL, lids and conjunctivae normal ENMT: Mucous membranes are moist. Posterior pharynx clear  of any exudate or lesions. Age-appropriate dentition.  Unable to appropriately assess hearing.  Tongue deviates to the right when he sticks out his tongue at my request. Neck: normal, supple, no masses, no thyromegaly Respiratory: clear to auscultation bilaterally, no wheezing, no crackles. Normal respiratory effort. No accessory muscle use.  Cardiovascular: Regular rate and rhythm, no murmurs / rubs / gallops. No extremity edema. 2+ pedal pulses. No carotid bruits.  Abdomen: no tenderness, no masses palpated, no hepatosplenomegaly. Bowel sounds positive.  Musculoskeletal: no clubbing / cyanosis. No joint deformity upper and lower extremities. Good ROM of the left upper and lower extremities., no contractures, no atrophy. Normal muscle tone of the left upper and lower extremities. Skin: no rashes, lesions, ulcers. No induration Neurologic: Sensation intact. Strength 5/5 in left upper and lower extremities.  Unable to assess strength in the right upper and lower extremities. Psychiatric: Unable to assess judgment and insight. Alert and oriented.  Unable to assess mood.   EKG: independently reviewed, showing sinus tachycardia with rate of 100, QTc 478  Chest x-ray on Admission: I personally reviewed and I agree with radiologist reading as below.  DG Chest Port 1 View  Result Date: 10/05/2021 CLINICAL DATA:  Stroke, TIA EXAM: PORTABLE CHEST 1 VIEW COMPARISON:  07/11/2017 FINDINGS: Heart and mediastinal contours are within normal limits. No focal opacities or effusions. No acute bony abnormality. IMPRESSION: No active disease. Electronically Signed   By: Rolm Baptise M.D.   On: 10/05/2021 17:21   CT HEAD WO CONTRAST  Result Date: 10/05/2021 CLINICAL DATA:  Mental status change, unknown cause EXAM: CT HEAD WITHOUT CONTRAST TECHNIQUE: Contiguous axial images were obtained from the base of the skull through the vertex without intravenous contrast. RADIATION DOSE REDUCTION: This exam was performed  according to the departmental dose-optimization program which includes automated exposure control, adjustment of the mA and/or kV according to patient size and/or use of iterative reconstruction technique. COMPARISON:  None Available. FINDINGS: Brain: Mild hypoattenuation in the region of the inferior left frontal lobe and anterior left insula. No acute hemorrhage, mass lesion, midline shift, or hydrocephalus. No visible extra-axial fluid collection. Vascular: No hyperdense vessel identified. Calcific intracranial atherosclerosis. Skull: No acute fracture. Sinuses/Orbits: Clear visualized sinuses. No acute orbital findings. Other: No mastoid effusions. IMPRESSION: Mild hypoattenuation in the region of the inferior left frontal lobe and anterior left insula.  This may be artifactual given suspected streak artifact in this region; however, if there is concern for left MCA territory infarct then recommend MRI for more sensitive evaluation. Electronically Signed   By: Margaretha Sheffield M.D.   On: 10/05/2021 15:20    Labs on Admission: I have personally reviewed following labs  CBC: Recent Labs  Lab 10/05/21 1407  WBC 7.9  NEUTROABS 4.7  HGB 14.4  HCT 43.0  MCV 90.0  PLT 419   Basic Metabolic Panel: Recent Labs  Lab 10/05/21 1407  NA 137  K 3.8  CL 106  CO2 23  GLUCOSE 119*  BUN 12  CREATININE 0.90  CALCIUM 8.3*   GFR: Estimated Creatinine Clearance: 69.6 mL/min (by C-G formula based on SCr of 0.9 mg/dL).  Liver Function Tests: Recent Labs  Lab 10/05/21 1407  AST 19  ALT 16  ALKPHOS 47  BILITOT 0.5  PROT 6.5  ALBUMIN 3.8   Coagulation Profile: Recent Labs  Lab 10/05/21 1407  INR 1.1   Urine analysis:    Component Value Date/Time   COLORURINE STRAW (A) 10/05/2021 1440   APPEARANCEUR CLEAR (A) 10/05/2021 1440   LABSPEC 1.004 (L) 10/05/2021 1440   PHURINE 6.0 10/05/2021 1440   GLUCOSEU NEGATIVE 10/05/2021 1440   HGBUR NEGATIVE 10/05/2021 1440   BILIRUBINUR NEGATIVE  10/05/2021 1440   KETONESUR 5 (A) 10/05/2021 1440   PROTEINUR NEGATIVE 10/05/2021 1440   NITRITE NEGATIVE 10/05/2021 1440   LEUKOCYTESUR NEGATIVE 10/05/2021 1440   Dr. Tobie Poet [facility]  If 7PM-7AM, please contact overnight-coverage provider If 7AM-7PM, please contact day coverage provider [email or web address]  10/05/2021, 6:32 PM

## 2021-10-05 NOTE — Hospital Course (Signed)
[name] is a 76 year old male with history of left-sided carotid stenosis, non-insulin-dependent diabetes mellitus type 2, PAD, GERD, mild COPD, history of tobacco use, hyperlipidemia, hypertension, who presents emergency department for chief concerns of facial droop on the left side, expressive aphasia.  Per ED, last known normal was on 10/04/2021 at approximately 1530.  Initial vitals in the emergency department showed temperature of 98, respiration rate of 24, heart rate of 104, blood pressure 180/101, SPO2 of 99% on room air.  Serum sodium is 137, potassium 3.8, chloride 106, bicarb 23, BUN of 12, serum creatinine of 0.90, GFR greater than 60, nonfasting blood glucose 119, WBC 7.9, hemoglobin 14.4, platelets of 207.  UA was negative for leukocytes and nitrates.  COVID/influenza A/influenza B PCR were negative.  EtOH level was negative.  UDS is pending.  CT of the head was read as mild hypoattenuation in the region of the inferior left frontal lobe and anterior left insula.  This may be artifactual given suspected streak artifact in this region however if there is concern for left MCA territory infarct, recommend MRI.  ED treatment aspirin 325 milligram one-time dose.

## 2021-10-05 NOTE — ED Triage Notes (Signed)
[facility] reports pt coming from home c/o right side facial droop and right arm weakness that started yesterday at 1535. Pt c/o headache since he got up am around 0500.

## 2021-10-05 NOTE — ED Notes (Signed)
Pt resting at this time, NAD.

## 2021-10-05 NOTE — Assessment & Plan Note (Signed)
Stroke-like symptoms - AM team to consult neurology - Complete echo ordered - MRI of the brain ordered and portable stat chest x-ray ordered - Fasting lipid and A1c ordered - Permissive hypertension with labetalol 5 mg IV every 3 hours as needed for SBP greater than 190, 4 doses ordered - Frequent neuro vascular checks - N.p.o. pending swallow study - PT, OT, SLP - Fall precaution

## 2021-10-05 NOTE — Assessment & Plan Note (Signed)
-   MRA of the head and neck ordered - A.m. team to consult vascular if indicated

## 2021-10-05 NOTE — ED Notes (Signed)
Pt pants and brief changed at this time

## 2021-10-05 NOTE — Assessment & Plan Note (Signed)
-   Holding home metformin on admission - Insulin SSI with at bedtime coverage ordered - Goal inpatient blood glucose levels 140-180

## 2021-10-05 NOTE — ED Provider Notes (Signed)
[facility] Provider Note    Event Date/Time   First MD Initiated Contact with Patient 10/05/21 1502     (approximate)   History   Extremity Weakness   HPI  Gerald Bauer is a 76 y.o. male  who, per vascular surgery note dated 03/14/18 has history of carotid stenosis, who presents to the emergency department today because of concern for speech difficulty. Family noticed the patient was having a hard time getting his words out when they called him around 1 oclock this morning. Family last talked to him yesterday around 330pm. The patient is unsure when he started having the difficulty and cannot give any estimation if it happened today or yesterday. The patient is able to communicate that he has a headache but h and p is limited by aphagia.     Physical Exam   Triage Vital Signs: ED Triage Vitals  Enc Vitals Group     BP 10/05/21 1402 (!) 180/101     Pulse Rate 10/05/21 1402 (!) 102     Resp 10/05/21 1402 18     Temp 10/05/21 1402 98 F (36.7 C)     Temp Source 10/05/21 1402 Oral     SpO2 10/05/21 1402 99 %     Weight 10/05/21 1405 176 lb (79.8 kg)     Height 10/05/21 1405 '5\' 7"'$  (1.702 m)     Head Circumference --      Peak Flow --      Pain Score 10/05/21 1404 (S) 3     Pain Loc --      Pain Edu? --      Excl. in [place]? --     Most recent vital signs: Vitals:   10/05/21 1402  BP: (!) 180/101  Pulse: (!) 102  Resp: 18  Temp: 98 F (36.7 C)  SpO2: 99%    General: Awake, alert.  CV:  Good peripheral perfusion. Regular rate and rhythm. Resp:  Normal effort. Lungs clear. Abd:  No distention.  Neuro:  PERRL. EOMI. Strength 5/5 in upper and lower extremities. Some right sided facial weakness. Expressive aphagia.    ED Results / Procedures / Treatments   Labs (all labs ordered are listed, but only abnormal results are displayed) Labs Reviewed  COMPREHENSIVE METABOLIC PANEL - Abnormal; Notable for the following components:      Result  Value   Glucose, Bld 119 (*)    Calcium 8.3 (*)    All other components within normal limits  RESP PANEL BY RT-PCR (FLU A&B, COVID) ARPGX2  ETHANOL  PROTIME-INR  APTT  CBC  DIFFERENTIAL  URINE DRUG SCREEN, QUALITATIVE ([facility] ONLY)  URINALYSIS, ROUTINE W REFLEX MICROSCOPIC     EKG  I, Nance Pear, attending physician, personally viewed and interpreted this EKG  EKG Time: 1407 Rate: 100 Rhythm: sinus tachycardia Axis: left axis deviation Intervals: qtc 478 QRS: narrow ST changes: no st elevation Impression: abnormal ekg   RADIOLOGY I independently interpreted and visualized the CT head. My interpretation: No large bleed Radiology interpretation:  IMPRESSION:  Mild hypoattenuation in the region of the inferior left frontal lobe  and anterior left insula. This may be artifactual given suspected  streak artifact in this region; however, if there is concern for  left MCA territory infarct then recommend MRI for more sensitive  evaluation.     PROCEDURES:  Critical Care performed: No  Procedures   MEDICATIONS ORDERED IN ED: Medications - No data to display  IMPRESSION / MDM / ASSESSMENT AND PLAN / ED COURSE  I reviewed the triage vital signs and the nursing notes.                              Differential diagnosis includes, but is not limited to, embolic stroke, intracranial bleed.  Patient's presentation is most consistent with acute presentation with potential threat to life or bodily function.  Patient presented to the emergency department today because of concerns for aphasia.  Unfortunately patient's last known well was roughly 24 hours ago.  On exam patient is clearly aphasic.  The patient head CT is concerning for acute CVA.  Discussed this finding with the patient.  Discussed with Dr. Tobie Poet with the hospitalist service who will plan on admission.  FINAL CLINICAL IMPRESSION(S) / ED DIAGNOSES   Final diagnoses:  Aphagia  Cerebrovascular accident  (CVA), unspecified mechanism ([place])        Note:  This document was prepared using Dragon voice recognition software and may include unintentional dictation errors.    Nance Pear, MD 10/05/21 2006

## 2021-10-05 NOTE — Assessment & Plan Note (Addendum)
-   Intolerant to statin - Patient is on Repatha injections subcutaneous every 2 weeks - Lipid panel in the a.m.

## 2021-10-05 NOTE — Assessment & Plan Note (Deleted)
Stroke-like symptoms - AM team to consult neurology - Complete echo ordered - MRI of the brain ordered and portable stat chest x-ray ordered - Fasting lipid and A1c ordered - Permissive hypertension with labetalol 5 mg IV every 3 hours as needed for SBP greater than 190, 4 doses ordered - Frequent neuro vascular checks - N.p.o. pending swallow study - PT, OT, SLP - Fall precaution

## 2021-10-06 ENCOUNTER — Inpatient Hospital Stay (HOSPITAL_COMMUNITY)
Admit: 2021-10-06 | Discharge: 2021-10-06 | Disposition: A | Discharge status: Home / Self Care | Payer: Medicare Other | Attending: Internal Medicine | Admitting: Internal Medicine

## 2021-10-06 ENCOUNTER — Ambulatory Visit: Payer: Medicare Other | Admitting: *Deleted

## 2021-10-06 DIAGNOSIS — E669 Obesity, unspecified: Secondary | ICD-10-CM | POA: Diagnosis not present

## 2021-10-06 DIAGNOSIS — R130 Aphagia: Secondary | ICD-10-CM | POA: Diagnosis present

## 2021-10-06 DIAGNOSIS — I69320 Aphasia following cerebral infarction: Secondary | ICD-10-CM | POA: Diagnosis not present

## 2021-10-06 DIAGNOSIS — E1151 Type 2 diabetes mellitus with diabetic peripheral angiopathy without gangrene: Secondary | ICD-10-CM | POA: Diagnosis present

## 2021-10-06 DIAGNOSIS — I639 Cerebral infarction, unspecified: Secondary | ICD-10-CM

## 2021-10-06 DIAGNOSIS — T466X5A Adverse effect of antihyperlipidemic and antiarteriosclerotic drugs, initial encounter: Secondary | ICD-10-CM | POA: Diagnosis present

## 2021-10-06 DIAGNOSIS — R4701 Aphasia: Secondary | ICD-10-CM | POA: Diagnosis present

## 2021-10-06 DIAGNOSIS — K5900 Constipation, unspecified: Secondary | ICD-10-CM | POA: Diagnosis not present

## 2021-10-06 DIAGNOSIS — I10 Essential (primary) hypertension: Secondary | ICD-10-CM | POA: Diagnosis present

## 2021-10-06 DIAGNOSIS — Z87891 Personal history of nicotine dependence: Secondary | ICD-10-CM | POA: Diagnosis not present

## 2021-10-06 DIAGNOSIS — E785 Hyperlipidemia, unspecified: Secondary | ICD-10-CM | POA: Diagnosis present

## 2021-10-06 DIAGNOSIS — I6522 Occlusion and stenosis of left carotid artery: Secondary | ICD-10-CM | POA: Diagnosis present

## 2021-10-06 DIAGNOSIS — Z888 Allergy status to other drugs, medicaments and biological substances status: Secondary | ICD-10-CM | POA: Diagnosis not present

## 2021-10-06 DIAGNOSIS — I69391 Dysphagia following cerebral infarction: Secondary | ICD-10-CM | POA: Diagnosis not present

## 2021-10-06 DIAGNOSIS — Y92239 Unspecified place in hospital as the place of occurrence of the external cause: Secondary | ICD-10-CM | POA: Diagnosis present

## 2021-10-06 DIAGNOSIS — G4700 Insomnia, unspecified: Secondary | ICD-10-CM | POA: Diagnosis not present

## 2021-10-06 DIAGNOSIS — J449 Chronic obstructive pulmonary disease, unspecified: Secondary | ICD-10-CM | POA: Diagnosis present

## 2021-10-06 DIAGNOSIS — K219 Gastro-esophageal reflux disease without esophagitis: Secondary | ICD-10-CM | POA: Diagnosis present

## 2021-10-06 DIAGNOSIS — Z8546 Personal history of malignant neoplasm of prostate: Secondary | ICD-10-CM | POA: Diagnosis not present

## 2021-10-06 DIAGNOSIS — G8191 Hemiplegia, unspecified affecting right dominant side: Secondary | ICD-10-CM | POA: Diagnosis present

## 2021-10-06 DIAGNOSIS — M79641 Pain in right hand: Secondary | ICD-10-CM | POA: Diagnosis not present

## 2021-10-06 DIAGNOSIS — I63512 Cerebral infarction due to unspecified occlusion or stenosis of left middle cerebral artery: Secondary | ICD-10-CM

## 2021-10-06 DIAGNOSIS — Z20822 Contact with and (suspected) exposure to covid-19: Secondary | ICD-10-CM | POA: Diagnosis present

## 2021-10-06 DIAGNOSIS — R29810 Facial weakness: Secondary | ICD-10-CM | POA: Diagnosis present

## 2021-10-06 DIAGNOSIS — R29713 NIHSS score 13: Secondary | ICD-10-CM | POA: Diagnosis present

## 2021-10-06 DIAGNOSIS — I152 Hypertension secondary to endocrine disorders: Secondary | ICD-10-CM | POA: Diagnosis present

## 2021-10-06 DIAGNOSIS — G720 Drug-induced myopathy: Secondary | ICD-10-CM | POA: Diagnosis present

## 2021-10-06 DIAGNOSIS — E1169 Type 2 diabetes mellitus with other specified complication: Secondary | ICD-10-CM | POA: Diagnosis present

## 2021-10-06 DIAGNOSIS — Z9049 Acquired absence of other specified parts of digestive tract: Secondary | ICD-10-CM | POA: Diagnosis not present

## 2021-10-06 DIAGNOSIS — E119 Type 2 diabetes mellitus without complications: Secondary | ICD-10-CM | POA: Diagnosis not present

## 2021-10-06 DIAGNOSIS — Z880 Allergy status to penicillin: Secondary | ICD-10-CM | POA: Diagnosis not present

## 2021-10-06 DIAGNOSIS — I700 Atherosclerosis of aorta: Secondary | ICD-10-CM | POA: Diagnosis present

## 2021-10-06 DIAGNOSIS — K5901 Slow transit constipation: Secondary | ICD-10-CM | POA: Diagnosis not present

## 2021-10-06 DIAGNOSIS — Z7984 Long term (current) use of oral hypoglycemic drugs: Secondary | ICD-10-CM | POA: Diagnosis not present

## 2021-10-06 HISTORY — DX: Cerebral infarction, unspecified: I63.9

## 2021-10-06 LAB — HEMOGLOBIN A1C
Hgb A1c MFr Bld: 7.1 % — ABNORMAL HIGH (ref 4.8–5.6)
Mean Plasma Glucose: 157.07 mg/dL

## 2021-10-06 LAB — LIPID PANEL
Cholesterol: 112 mg/dL (ref 0–200)
HDL: 47 mg/dL (ref >40)
LDL Cholesterol: 44 mg/dL (ref 0–99)
Total CHOL/HDL Ratio: 2.4 RATIO
Triglycerides: 105 mg/dL (ref <150)
VLDL: 21 mg/dL (ref 0–40)

## 2021-10-06 LAB — ECHOCARDIOGRAM COMPLETE
AR max vel: 3.32 cm2
AV Area VTI: 4.26 cm2
AV Area mean vel: 3.14 cm2
AV Mean grad: 1.5 mmHg
AV Peak grad: 1.9 mmHg
Ao pk vel: 0.69 m/s
Area-P 1/2: 4.15 cm2
Height: 67 in
Weight: 2816 oz

## 2021-10-06 LAB — GLUCOSE, CAPILLARY
Glucose-Capillary: 126 mg/dL — ABNORMAL HIGH (ref 70–99)
Glucose-Capillary: 176 mg/dL — ABNORMAL HIGH (ref 70–99)

## 2021-10-06 LAB — CBG MONITORING, ED
Glucose-Capillary: 169 mg/dL — ABNORMAL HIGH (ref 70–99)
Glucose-Capillary: 200 mg/dL — ABNORMAL HIGH (ref 70–99)

## 2021-10-06 MED ORDER — CLOPIDOGREL BISULFATE 75 MG PO TABS
75.0000 mg | ORAL_TABLET | Freq: Every day | ORAL | Status: DC
  Administered 2021-10-06 – 2021-10-09 (×4): 75 mg via ORAL
  Filled 2021-10-06 (×4): qty 1

## 2021-10-06 NOTE — ED Notes (Signed)
Pt brief and sheet changed at this time

## 2021-10-06 NOTE — ED Notes (Signed)
RN to bedside to introduce self to pt. Advised I was delayed in next room and would be back shortly.

## 2021-10-06 NOTE — Evaluation (Signed)
Occupational Therapy Evaluation Patient Details Name: Gerald Bauer MRN: [id number] DOB: 1945/04/22 Today's Date: 10/06/2021   History of Present Illness Pt is a 76 year old male with history of left-sided carotid stenosis, non-insulin-dependent diabetes mellitus type 2, PAD, GERD, mild COPD, history of tobacco use, hyperlipidemia, hypertension, who presents to ED for chief concerns of facial droop on the left side and R sided weakness, expressive aphasia. MD assessment includes aphasia w/ stroke-like symptoms. 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 Patient with expressive difficulties and unable to voice during session. Pt does accurately head nod yes/no to questions. Supportive family in room to confirm baseline and home set up. Pt lives at home alone and is independent in all aspects of care. 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 Pt's son in room reports he can stay with him at discharge if needed.  Patient currently performing bed mobility with mod A to EOB. Min A - mod A for static sitting balance with R lateral lean. Pt stands with mod A and mod - max A for ambulation without use of RW. Pt needing assistance with weight shift and at times advancement of R LE. He is very pleasant throughout and motivated. Pt with 3-/5 in R hand, wrist, and elbow but increased difficulty with shoulder elevation in anything other than gravity eliminated position.  Patient will benefit from acute OT to increase overall independence in the areas of ADLs, functional mobility, and safety awareness in order to safely discharge to next venue of care.      Recommendations for follow up therapy are one component of a multi-disciplinary discharge planning process, led by the attending physician.  Recommendations  may be updated based on patient status, additional functional criteria and insurance authorization.   Follow Up Recommendations  Acute inpatient rehab (3hours/day)    Assistance Recommended at Discharge Frequent or constant Supervision/Assistance  Patient can return home with the following A lot of help with walking and/or transfers;A lot of help with bathing/dressing/bathroom;Help with stairs or ramp for entrance;Direct supervision/assist for financial management;Assist for transportation;Assistance with cooking/housework;Assistance with feeding;Direct supervision/assist for medications management    Functional Status Assessment  Patient has had a recent decline in their functional status and demonstrates the ability to make significant improvements in function in a reasonable and predictable amount of time.  Equipment Recommendations  Other (comment) (defer to next venue of care)       Precautions / Restrictions Precautions Precautions: Fall Restrictions Weight Bearing Restrictions: No      Mobility Bed Mobility Overal bed mobility: Needs Assistance Bed Mobility: Supine to Sit, Sit to Supine     Supine to sit: Mod assist Sit to supine: Mod assist        Transfers Overall transfer level: Needs assistance Equipment used: None Transfers: Sit to/from Stand, Bed to chair/wheelchair/BSC Sit to Stand: Mod assist     Step pivot transfers: Mod assist, Max assist            Balance Overall balance assessment: Needs assistance Sitting-balance support: Single extremity supported, Feet supported Sitting balance-Leahy Scale: Fair Sitting balance - Comments: Initalling w/ heavy R sided lean and posterior LOB with poor righting reactions. able to sustain sitting following anterior and left weightshifting   Standing balance support: Single extremity supported, During functional activity, Reliant on assistive device for balance Standing balance-Leahy  Scale: Poor Standing balance  comment: heavy right sided lean                           ADL either performed or assessed with clinical judgement   ADL Overall ADL's : Needs assistance/impaired       Grooming Details (indicate cue type and reason): hand over hand assistance to bring R hand to face.                 Toilet Transfer: Moderate assistance;Maximal assistance Toilet Transfer Details (indicate cue type and reason): simulated transfer                 Vision Patient Visual Report: No change from baseline              Pertinent Vitals/Pain Pain Assessment Pain Assessment: Faces Faces Pain Scale: No hurt     Hand Dominance Right   Extremity/Trunk Assessment Upper Extremity Assessment Upper Extremity Assessment: RUE deficits/detail RUE Deficits / Details: 3-/5 limited AROM against gravity and PROM WFLs   Lower Extremity Assessment Lower Extremity Assessment: Defer to PT evaluation RLE Deficits / Details: RLE MMT 3/5 hip flex 2/5; LLE WFL. Sensation WNL bilaterally       Communication Communication Communication: Expressive difficulties   Cognition Arousal/Alertness: Awake/alert Behavior During Therapy: WFL for tasks assessed/performed, Flat affect Overall Cognitive Status: Within Functional Limits for tasks assessed                                 General Comments: Pt displaying expressive difficulties but head nods to yes/no questions. Family present to confirm baseline. Pt follows commands with increased time.                Home Living Family/patient expects to be discharged to:: Private residence Living Arrangements: Alone Available Help at Discharge: Family;Available PRN/intermittently Type of Home: House Home Access: Stairs to enter [employer] of Steps: 5-6 Entrance Stairs-Rails: Can reach both Home Layout: One level     Bathroom Shower/Tub: Occupational psychologist: Standard Bathroom Accessibility: Yes   Home  Equipment: None          Prior Functioning/Environment Prior Level of Function : Independent/Modified Independent;Driving             Mobility Comments: Ind community ambulator using no AD, no hx of falls ADLs Comments: Ind w/ ADLs, IADLs, and drives at baseline. Retired Dealer        OT Problem List: Decreased strength;Decreased knowledge of use of DME or AE;Decreased range of motion;Decreased coordination;Decreased activity tolerance;Impaired UE functional use;Impaired balance (sitting and/or standing);Decreased safety awareness      OT Treatment/Interventions: Self-care/ADL training;Manual therapy;Therapeutic exercise;Patient/family education;Balance training;Energy conservation;Therapeutic activities;Cognitive remediation/compensation    OT Goals(Current goals can be found in the care plan section) Acute Rehab OT Goals Patient Stated Goal: to get stronger and go to rehab OT Goal Formulation: With patient Time For Goal Achievement: 10/20/21 Potential to Achieve Goals: Good ADL Goals Pt Will Perform Grooming: with supervision;standing Pt Will Perform Lower Body Dressing: sit to/from stand;with min assist Pt Will Transfer to Toilet: with min assist;ambulating Pt Will Perform Toileting - Clothing Manipulation and hygiene: with min assist;sit to/from stand Pt/caregiver will Perform Home Exercise Program: Increased strength;Increased ROM;Right Upper extremity;With theraputty;With written HEP provided;With Supervision  OT Frequency: Min 2X/week       AM-PAC OT "6 Clicks"  Daily Activity     Outcome Measure Help from another person eating meals?: A Little Help from another person taking care of personal grooming?: A Lot Help from another person toileting, which includes using toliet, bedpan, or urinal?: Total Help from another person bathing (including washing, rinsing, drying)?: A Lot Help from another person to put on and taking off regular upper body clothing?: A Lot Help  from another person to put on and taking off regular lower body clothing?: Total 6 Click Score: 11   End of Session Nurse Communication: Mobility status  Activity Tolerance: Patient limited by fatigue Patient left: in bed;with call bell/phone within reach;with family/visitor present  OT Visit Diagnosis: Unsteadiness on feet (R26.81);Repeated falls (R29.6);Muscle weakness (generalized) (M62.81);Hemiplegia and hemiparesis Hemiplegia - Right/Left: Right Hemiplegia - dominant/non-dominant: Dominant                Time: 9798-9211 OT Time Calculation (min): 22 min Charges:  OT General Charges $OT Visit: 1 Visit OT Evaluation $OT Eval Moderate Complexity: 1 Mod OT Treatments $Self Care/Home Management : 8-22 mins  Darleen Crocker, MS, OTR/L , CBIS ascom [phone]  10/06/21, 1:08 PM

## 2021-10-06 NOTE — Evaluation (Signed)
Physical Therapy Evaluation Patient Details Name: Gerald Bauer MRN: [id number] DOB: [birth year] Today's Date: 10/06/2021  History of Present Illness  Pt is a 76 year old male with history of left-sided carotid stenosis, non-insulin-dependent diabetes mellitus type 2, PAD, GERD, mild COPD, history of tobacco use, hyperlipidemia, hypertension, who presents to ED for chief concerns of facial droop on the left side and R sided weakness, expressive aphasia. MD assessment includes aphasia w/ stroke-like symptoms. 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 Pt able to complete sit<>supine w/ minA for trunk control; pt able to slowly move LE's to EOB. Sit<>supine needed +97mnA for trunk control and leg management. Pt able to perform sit to stand w/ +2 minA for steadying to prevent heavy R sided lean and posterior LOB; is able to initiate stand from elevated bed. Pt able to complete steps forward/backward using hemiwalker w/ +2 minA for steadying and needing cuing for sequencing throughout. Occasional assist needed to advance RLE. Pt will benefit from IR upon discharge to safely address deficits listed in patient problem list for decreased caregiver assistance and eventual return to PLOF.       Recommendations for follow up therapy are one component of a multi-disciplinary discharge planning process, led by the attending physician.  Recommendations may be updated based on patient status, additional functional criteria and insurance authorization.  Follow Up Recommendations Acute inpatient rehab (3hours/day)      Assistance Recommended at Discharge Frequent or constant Supervision/Assistance  Patient can return home with the following  Assist for transportation;Help with stairs or ramp for entrance;Two people to help with  walking and/or transfers;A lot of help with bathing/dressing/bathroom;Assistance with cooking/housework    Equipment Recommendations BSC/3in1;Other (comment) (walking device TBD)  Recommendations for Other Services  Rehab consult    Functional Status Assessment Patient has had a recent decline in their functional status and demonstrates the ability to make significant improvements in function in a reasonable and predictable amount of time.     Precautions / Restrictions Precautions Precautions: Fall Restrictions Weight Bearing Restrictions: No      Mobility  Bed Mobility Overal bed mobility: Needs Assistance Bed Mobility: Supine to Sit, Sit to Supine     Supine to sit: Min assist Sit to supine: Min assist, +2 for physical assistance   General bed mobility comments: able to slowly move LE's to EOB with effort, minA needed trunk control to sit up. +2 minA for sit<>supine    Transfers Overall transfer level: Needs assistance Equipment used: Hemi-walker Transfers: Sit to/from Stand Sit to Stand: +2 physical assistance, Min assist           General transfer comment: able to initiate stand but once up needs +2 minA for steadying to prevent posterior LOB and heavy R sided lean    Ambulation/Gait Ambulation/Gait assistance: +2 physical assistance, Min assist Gait Distance (Feet): 8 Feet Assistive device: Hemi-walker Gait Pattern/deviations: Step-to pattern, Decreased step length - right, Decreased step length - left Gait velocity: decreased     General Gait Details: able to take multiple foward/backward stepping w/ +2 minA for steadying and heavying cuing needed for sequencing. occasional assist needed to advance RLE  Stairs            Wheelchair Mobility    Modified Rankin (Stroke Patients Only)       Balance Overall balance assessment: Needs  assistance Sitting-balance support: Single extremity supported, Feet supported Sitting balance-Leahy Scale:  Fair Sitting balance - Comments: Initalling w/ heavy R sided lean and posterior LOB with poor righting reactions. able to sustain sitting following anterior and left weightshifting   Standing balance support: Single extremity supported, During functional activity, Reliant on assistive device for balance Standing balance-Leahy Scale: Poor Standing balance comment: heavy right sided lean                             Pertinent Vitals/Pain Pain Assessment Pain Assessment: No/denies pain    Home Living Family/patient expects to be discharged to:: Private residence Living Arrangements: Alone Available Help at Discharge: Family;Available PRN/intermittently Type of Home: House Home Access: Stairs to enter Entrance Stairs-Rails: Can reach both Entrance Stairs-Number of Steps: 5-6   Home Layout: One level Home Equipment: None      Prior Function Prior Level of Function : Independent/Modified Independent;Driving             Mobility Comments: Ind community ambulator using no AD, no hx of falls ADLs Comments: Ind w/ ADLs, IADLs, and drives at baseline. Retired Dance movement psychotherapist   Dominant Hand: Right    Extremity/Trunk Assessment   Upper Extremity Assessment Upper Extremity Assessment: RUE deficits/detail RUE Deficits / Details: 3-/5 limited AROM against gravity and PROM WFLs    Lower Extremity Assessment Lower Extremity Assessment: Generalized weakness;RLE deficits/detail RLE Deficits / Details: RLE MMT grossly 3/5 other than hip flex 2/5; LLE WFL. Sensation WNL bilaterally       Communication   Communication: Expressive difficulties  Cognition Arousal/Alertness: Lethargic Behavior During Therapy: WFL for tasks assessed/performed, Flat affect Overall Cognitive Status: Within Functional Limits for tasks assessed                                 General Comments: pt unable to express any speech but able to nod yes/no and follow  commands. Hx/PLOF gathered in combination with pt and family present        General Comments      Exercises     Assessment/Plan    PT Assessment Patient needs continued PT services  PT Problem List Decreased strength;Decreased mobility;Decreased activity tolerance;Decreased coordination;Decreased balance;Decreased knowledge of use of DME       PT Treatment Interventions DME instruction;Therapeutic exercise;Balance training;Gait training;Stair training;Neuromuscular re-education;Functional mobility training;Therapeutic activities;Patient/family education    PT Goals (Current goals can be found in the Care Plan section)  Acute Rehab PT Goals Patient Stated Goal: none stated PT Goal Formulation: With patient Time For Goal Achievement: 10/19/21 Potential to Achieve Goals: Fair    Frequency 7X/week     Co-evaluation               AM-PAC PT "6 Clicks" Mobility  Outcome Measure Help needed turning from your back to your side while in a flat bed without using bedrails?: A Little Help needed moving from lying on your back to sitting on the side of a flat bed without using bedrails?: A Little Help needed moving to and from a bed to a chair (including a wheelchair)?: A Lot Help needed standing up from a chair using your arms (e.g., wheelchair or bedside chair)?: A Little Help needed to walk in hospital room?: A Lot Help needed climbing 3-5 steps with a railing? : Total 6 Click Score: 14  End of Session Equipment Utilized During Treatment: Gait belt Activity Tolerance: Patient tolerated treatment well Patient left: in bed;with call bell/phone within reach;with family/visitor present Nurse Communication: Mobility status PT Visit Diagnosis: Other symptoms and signs involving the nervous system (R29.898);Unsteadiness on feet (R26.81);Muscle weakness (generalized) (M62.81);Hemiplegia and hemiparesis Hemiplegia - Right/Left: Right Hemiplegia - dominant/non-dominant:  Dominant Hemiplegia - caused by: Cerebral infarction    Time: 7225-7505 PT Time Calculation (min) (ACUTE ONLY): 25 min   Charges:              Turner Daniels, SPT  10/06/2021, 1:33 PM

## 2021-10-06 NOTE — Progress Notes (Signed)
PROGRESS NOTE    Gerald Bauer  GBT:[id number] DOB: [birth year] DOA: 10/05/2021 PCP: [name], PA-C    Brief Narrative:  76 year old gentleman with history of carotid stenosis, type 2 diabetes on oral hypoglycemics, peripheral artery disease, GERD, hyperlipidemia and hypertension presents to the ER with left-sided facial droop and unable to talk.  In the emergency room on room air.  Blood pressure stable.  Infection work-up negative.  CT head with hypoattenuation left frontal lobe and anterior left insula hypoattenuation.  Admitted with acute stroke.   Assessment & Plan:  Acute ischemic stroke: Clinical findings, expressive aphasia, right hemiparesis. CT head findings, hypoattenuation left frontal lobe and left insula. MRI of the brain, medium sized ischemic infarction left MCA territory. MRA of the head and neck, acute to subacute occlusion of the distal left ICA and MCA. 2D echocardiogram, pending. Antiplatelet therapy, on aspirin at home.  Neurology recommended to continue aspirin and Plavix indefinitely. LDL 44.  On Repatha.  On goal. Hemoglobin A1c, 7.1.  At goal.  Currently on sliding scale insulin.  On oral hypoglycemics that he can resume on discharge. PT/OT and neurology follow-up.  Speech swallow evaluation and cognition. Will benefit with acute inpatient rehab.  Type 2 diabetes, well controlled on metformin: Currently on SSI.  Hemoglobin A1c 7.1.  Resume metformin on discharge.  Essential hypertension: Blood pressures fairly stable.  Allow permissive hypertension.  Hyperlipidemia associated with type 2 diabetes: Intolerant to statin.  Continue Repatha.   DVT prophylaxis: enoxaparin (LOVENOX) injection 40 mg Start: 10/06/21 1000 SCD's Start: 10/05/21 1643   Code Status: Full code Family Communication: Son, daughter and daughter-in-law at the bedside Disposition Plan: Status is: Inpatient Remains inpatient appropriate because: Acute a stroke.  Inpatient work-up.      Consultants:  Neurology  Procedures:  None  Antimicrobials:  None   Subjective: Patient was seen and examined.  Family at the bedside.  Patient shook his head okay.  He tried to talk but he could not talk.  He understands and follows commands.  Able to lift her right leg.  Able to lift right arm but not against the strength.  Objective: Vitals:   10/06/21 0848 10/06/21 1100 10/06/21 1125 10/06/21 1200  BP: (!) 152/100  (!) 156/105   Pulse: 89 96 93 94  Resp: '11 15 12 17  '$ Temp:      TempSrc:      SpO2: 99% 98% 97% 97%  Weight:      Height:       No intake or output data in the 24 hours ending 10/06/21 1319 Filed Weights   10/05/21 1405  Weight: 79.8 kg    Examination:  General exam: Appears calm and comfortable  Respiratory system: Clear to auscultation. Respiratory effort normal. Cardiovascular system: S1 & S2 heard, RRR. No JVD, murmurs, rubs, gallops or clicks. No pedal edema. Gastrointestinal system: Abdomen is nondistended, soft and nontender. No organomegaly or masses felt. Normal bowel sounds heard. Central nervous system: Alert and oriented.  Right ptosis Note [name] has Prominent right facial droop Right upper extremity 0/5 proximal group, 2/5 distal group. Right lower extremity 2/5 proximal, 3/5 distal Left upper and lower extremity normal strength.   Data Reviewed: I have personally reviewed following labs and imaging studies  CBC: Recent Labs  Lab 10/05/21 1407  WBC 7.9  NEUTROABS 4.7  HGB 14.4  HCT 43.0  MCV 90.0  PLT 710   Basic Metabolic Panel: Recent Labs  Lab 10/05/21 1407  NA 137  K 3.8  CL 106  CO2 23  GLUCOSE 119*  BUN 12  CREATININE 0.90  CALCIUM 8.3*   GFR: Estimated Creatinine Clearance: 69.6 mL/min (by C-G formula based on SCr of 0.9 mg/dL). Liver Function Tests: Recent Labs  Lab 10/05/21 1407  AST 19  ALT 16  ALKPHOS 47  BILITOT 0.5  PROT 6.5  ALBUMIN 3.8   No results for input(s): "LIPASE", "AMYLASE" in  the last 168 hours. No results for input(s): "AMMONIA" in the last 168 hours. Coagulation Profile: Recent Labs  Lab 10/05/21 1407  INR 1.1   Cardiac Enzymes: No results for input(s): "CKTOTAL", "CKMB", "CKMBINDEX", "TROPONINI" in the last 168 hours. BNP (last 3 results) No results for input(s): "PROBNP" in the last 8760 hours. HbA1C: Recent Labs    10/06/21 0510  HGBA1C 7.1*   CBG: Recent Labs  Lab 10/05/21 2105 10/06/21 0814 10/06/21 1206  GLUCAP 153* 169* 200*   Lipid Profile: Recent Labs    10/06/21 0510  CHOL 112  HDL 47  LDLCALC 44  TRIG 105  CHOLHDL 2.4   Thyroid Function Tests: No results for input(s): "TSH", "T4TOTAL", "FREET4", "T3FREE", "THYROIDAB" in the last 72 hours. Anemia Panel: No results for input(s): "VITAMINB12", "FOLATE", "FERRITIN", "TIBC", "IRON", "RETICCTPCT" in the last 72 hours. Sepsis Labs: No results for input(s): "PROCALCITON", "LATICACIDVEN" in the last 168 hours.  Recent Results (from the past 240 hour(s))  Resp Panel by RT-PCR (Flu A&B, Covid) Anterior Nasal Swab     Status: None   Collection Time: 10/05/21  2:07 PM   Specimen: Anterior Nasal Swab  Result Value Ref Range Status   SARS Coronavirus 2 by RT PCR NEGATIVE NEGATIVE Final    Comment: (NOTE) SARS-CoV-2 target nucleic acids are NOT DETECTED.  The SARS-CoV-2 RNA is generally detectable in upper respiratory specimens during the acute phase of infection. The lowest concentration of SARS-CoV-2 viral copies this assay can detect is 138 copies/mL. A negative result does not preclude SARS-Cov-2 infection and should not be used as the sole basis for treatment or other patient management decisions. A negative result may occur with  improper specimen collection/handling, submission of specimen other than nasopharyngeal swab, presence of viral mutation(s) within the areas targeted by this assay, and inadequate number of viral copies(<138 copies/mL). A negative result must be  combined with clinical observations, patient history, and epidemiological information. The expected result is Negative.  Fact Sheet for Patients:  EntrepreneurPulse.com.au  Fact Sheet for Healthcare Providers:  IncredibleEmployment.be  This test is no t yet approved or cleared by the [place] FDA and  has been authorized for detection and/or diagnosis of SARS-CoV-2 by FDA under an Emergency Use Authorization (EUA). This EUA will remain  in effect (meaning this test can be used) for the duration of the COVID-19 declaration under Section 564(b)(1) of the Act, 21 U.S.C.section 360bbb-3(b)(1), unless the authorization is terminated  or revoked sooner.       Influenza A by PCR NEGATIVE NEGATIVE Final   Influenza B by PCR NEGATIVE NEGATIVE Final    Comment: (NOTE) The Xpert Xpress SARS-CoV-2/FLU/RSV plus assay is intended as an aid in the diagnosis of influenza from Nasopharyngeal swab specimens and should not be used as a sole basis for treatment. Nasal washings and aspirates are unacceptable for Xpert Xpress SARS-CoV-2/FLU/RSV testing.  Fact Sheet for Patients: EntrepreneurPulse.com.au  Fact Sheet for Healthcare Providers: IncredibleEmployment.be  This test is not yet approved or cleared by the [place] and has been authorized for  detection and/or diagnosis of SARS-CoV-2 by FDA under an Emergency Use Authorization (EUA). This EUA will remain in effect (meaning this test can be used) for the duration of the COVID-19 declaration under Section 564(b)(1) of the Act, 21 U.S.C. section 360bbb-3(b)(1), unless the authorization is terminated or revoked.  Performed at [facility], [address] [place], [place] [address]          Radiology Studies: MR BRAIN WO CONTRAST  Addendum Date: 10/06/2021   ADDENDUM REPORT: 10/06/2021 05:46 ADDENDUM: Case discussed with Dr. Sidney Ace by  telephone at 12:15 a.m. on 10/05/2021. Electronically Signed   By: Jeannine Boga M.D.   On: 10/06/2021 05:46   Result Date: 10/06/2021 CLINICAL DATA:  Initial evaluation for acute speech difficulty. EXAM: MRI HEAD WITHOUT CONTRAST MRA HEAD WITHOUT CONTRAST MRA NECK WITHOUT CONTRAST TECHNIQUE: Multiplanar, multiecho pulse sequences of the brain and surrounding structures were obtained without intravenous contrast. Angiographic images of the Circle of Willis were obtained using MRA technique without intravenous contrast. Angiographic images of the neck were obtained using MRA technique without intravenous contrast. Carotid stenosis measurements (when applicable) are obtained utilizing NASCET criteria, using the distal internal carotid diameter as the denominator. COMPARISON:  Prior ultrasound from 08/26/2013 and CT from 10/05/2021. FINDINGS: MRI HEAD FINDINGS Brain: Generalized age-related cerebral atrophy. Scattered patchy and hazy T2/FLAIR hyperintensity involving the periventricular deep white matter both cerebral hemispheres, most consistent with chronic small vessel ischemic disease, mild in nature. Scattered patchy and confluent restricted diffusion involving the left frontal lobe, with involvement of the left basal ganglia and insula, consistent with acute left MCA distribution infarct. Mild patchy involvement of the anterior mesial left temporal lobe noted as well. No associated hemorrhage or significant regional mass effect. No other evidence for acute or subacute ischemia. Gray-white matter differentiation otherwise maintained. Tiny remote left ACA territory infarct noted at the mesial left frontal lobe. No other areas of chronic cortical infarction. No acute or chronic intracranial blood products. No mass lesion, midline shift or mass effect. No hydrocephalus or extra-axial fluid collection. Pituitary gland suprasellar region within normal limits. Vascular: Abnormal flow void throughout the  visualized left ICA to the terminus, consistent with slow flow and/or occlusion. Susceptibility artifact within proximal left MCA branches at the left sylvian fissure, consistent with intraluminal thrombus (series 13, image 26). Remainder of the intracranial vascular flow voids are maintained. Skull and upper cervical spine: Craniocervical junction within normal limits. Bone marrow signal intensity normal. No scalp soft tissue abnormality. Sinuses/Orbits: Globes and orbital soft tissues within normal limits. Mild scattered mucosal thickening noted about the right maxillary sinus. Paranasal sinuses are otherwise clear. Trace left mastoid effusion noted, of doubtful significance. Other: None. MRA HEAD FINDINGS ANTERIOR CIRCULATION: Right ICA widely patent to the terminus without stenosis or other abnormality. Intermittent attenuated flow seen within the visualized mid cervical left ICA and cervical petrous junction. Otherwise, the left ICA is largely occluded to the terminus. Right A1 segment patent. Normal anterior communicating artery complex. Both ACAs remain patent and well perfused. Left A1 not well seen, and could be occluded. Scant attenuated flow seen within the left M1 segment proximally. No significant flow seen within the left MCA branches distally. Findings presumably acute given the presence of the acute infarct. POSTERIOR CIRCULATION: Both vertebral arteries patent to the vertebrobasilar junction without stenosis. Left vertebral artery dominant. Both PICA patent. Basilar patent to its distal aspect without stenosis. Superior cerebellar arteries patent bilaterally. Both PCAs primarily supplied via the basilar well perfused or  distal aspects. No visible aneurysm. MRA NECK FINDINGS AORTIC ARCH: Partially visualized aortic arch normal in caliber with standard 3 vessel morphology. No visible stenosis about the origin the great vessels. RIGHT CAROTID SYSTEM: Right CCA patent without stenosis. Atheromatous  change about the right carotid bulb/proximal right ICA with associated stenosis of up to 50% by NASCET criteria. Right ICA patent distally without stenosis or evidence for dissection. LEFT CAROTID SYSTEM: Left CCA patent to the bifurcation. 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 1.3 cm flow gap is present. Some attenuated flow seen distally within the cervical left ICA, which then largely reoccludes at the skull base. VERTEBRAL ARTERIES: Both vertebral arteries arise from the subclavian arteries. Neither vertebral artery origin well assessed on this exam. Visualized portions of the vertebral arteries patent without stenosis or evidence for dissection. IMPRESSION: MRI HEAD IMPRESSION: 1. 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 No associated hemorrhage or significant regional mass effect. 2. Loss of normal flow voids throughout the left ICA and MCA, consistent with slow flow and/or occlusion. 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 3. Underlying mild chronic microvascular ischemic disease. Tiny remote left ACA distribution infarct. MRA HEAD IMPRESSION: 1. 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 Left A1 segment not well seen either, also likely occluded. 2. Otherwise wide patency of the major intracranial arterial circulation. No other hemodynamically significant or correctable stenosis. MRA NECK IMPRESSION: 1. 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 Some attenuated flow seen distally within the cervical left ICA, with subsequent reocclusion by the skull base. 2. 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 Otherwise wide patency of the right carotid artery system. 3. Wide patency of the vertebral arteries  within the neck. The clinician taking care of this patient has been paged regarding these results, currently awaiting a call back. Results will be conveyed as soon as possible. Electronically Signed: By: Jeannine Boga M.D. On: 10/06/2021 00:07   MR ANGIO HEAD WO CONTRAST  Addendum Date: 10/06/2021   ADDENDUM REPORT: 10/06/2021 05:46 ADDENDUM: Case discussed with Dr. Sidney Ace by telephone at 12:15 a.m. on 10/05/2021. Electronically Signed   By: Jeannine Boga M.D.   On: 10/06/2021 05:46   Result Date: 10/06/2021 CLINICAL DATA:  Initial evaluation for acute speech difficulty. EXAM: MRI HEAD WITHOUT CONTRAST MRA HEAD WITHOUT CONTRAST MRA NECK WITHOUT CONTRAST TECHNIQUE: Multiplanar, multiecho pulse sequences of the brain and surrounding structures were obtained without intravenous contrast. Angiographic images of the Circle of Willis were obtained using MRA technique without intravenous contrast. Angiographic images of the neck were obtained using MRA technique without intravenous contrast. Carotid stenosis measurements (when applicable) are obtained utilizing NASCET criteria, using the distal internal carotid diameter as the denominator. COMPARISON:  Prior ultrasound from 08/26/2013 and CT from 10/05/2021. FINDINGS: MRI HEAD FINDINGS Brain: Generalized age-related cerebral atrophy. Scattered patchy and hazy T2/FLAIR hyperintensity involving the periventricular deep white matter both cerebral hemispheres, most consistent with chronic small vessel ischemic disease, mild in nature. Scattered patchy and confluent restricted diffusion involving the left frontal lobe, with involvement of the left basal ganglia and insula, consistent with acute left MCA distribution infarct. Mild patchy involvement of the anterior mesial left temporal lobe noted as well. No associated hemorrhage or significant regional  mass effect. No other evidence for acute or subacute ischemia. Gray-white matter differentiation otherwise  maintained. Tiny remote left ACA territory infarct noted at the mesial left frontal lobe. No other areas of chronic cortical infarction. No acute or chronic intracranial blood products. No mass lesion, midline shift or mass effect. No hydrocephalus or extra-axial fluid collection. Pituitary gland suprasellar region within normal limits. Vascular: Abnormal flow void throughout the visualized left ICA to the terminus, consistent with slow flow and/or occlusion. Susceptibility artifact within proximal left MCA branches at the left sylvian fissure, consistent with intraluminal thrombus (series 13, image 26). Remainder of the intracranial vascular flow voids are maintained. Skull and upper cervical spine: Craniocervical junction within normal limits. Bone marrow signal intensity normal. No scalp soft tissue abnormality. Sinuses/Orbits: Globes and orbital soft tissues within normal limits. Mild scattered mucosal thickening noted about the right maxillary sinus. Paranasal sinuses are otherwise clear. Trace left mastoid effusion noted, of doubtful significance. Other: None. MRA HEAD FINDINGS ANTERIOR CIRCULATION: Right ICA widely patent to the terminus without stenosis or other abnormality. Intermittent attenuated flow seen within the visualized mid cervical left ICA and cervical petrous junction. Otherwise, the left ICA is largely occluded to the terminus. Right A1 segment patent. Normal anterior communicating artery complex. Both ACAs remain patent and well perfused. Left A1 not well seen, and could be occluded. Scant attenuated flow seen within the left M1 segment proximally. No significant flow seen within the left MCA branches distally. Findings presumably acute given the presence of the acute infarct. POSTERIOR CIRCULATION: Both vertebral arteries patent to the vertebrobasilar junction without stenosis. Left vertebral artery dominant. Both PICA patent. Basilar patent to its distal aspect without stenosis. Superior  cerebellar arteries patent bilaterally. Both PCAs primarily supplied via the basilar well perfused or distal aspects. No visible aneurysm. MRA NECK FINDINGS AORTIC ARCH: Partially visualized aortic arch normal in caliber with standard 3 vessel morphology. No visible stenosis about the origin the great vessels. RIGHT CAROTID SYSTEM: Right CCA patent without stenosis. Atheromatous change about the right carotid bulb/proximal right ICA with associated stenosis of up to 50% by NASCET criteria. Right ICA patent distally without stenosis or evidence for dissection. LEFT CAROTID SYSTEM: Left CCA patent to the bifurcation. 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 1.3 cm flow gap is present. Some attenuated flow seen distally within the cervical left ICA, which then largely reoccludes at the skull base. VERTEBRAL ARTERIES: Both vertebral arteries arise from the subclavian arteries. Neither vertebral artery origin well assessed on this exam. Visualized portions of the vertebral arteries patent without stenosis or evidence for dissection. IMPRESSION: MRI HEAD IMPRESSION: 1. 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 No associated hemorrhage or significant regional mass effect. 2. Loss of normal flow voids throughout the left ICA and MCA, consistent with slow flow and/or occlusion. 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 3. Underlying mild chronic microvascular ischemic disease. Tiny remote left ACA distribution infarct. MRA HEAD IMPRESSION: 1. 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 Left A1 segment not well seen either, also likely occluded. 2. Otherwise wide patency of the major intracranial arterial circulation. No other hemodynamically significant or correctable stenosis. MRA NECK IMPRESSION: 1.  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 Some attenuated flow seen distally  within the cervical left ICA, with subsequent reocclusion by the skull base. 2. 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 Otherwise wide patency of the right carotid artery system. 3. Wide patency of the vertebral arteries within the neck. The clinician taking care of this patient has been paged regarding these results, currently awaiting a call back. Results will be conveyed as soon as possible. Electronically Signed: By: Jeannine Boga M.D. On: 10/06/2021 00:07   MR ANGIO NECK W WO CONTRAST  Addendum Date: 10/06/2021   ADDENDUM REPORT: 10/06/2021 05:46 ADDENDUM: Case discussed with Dr. Sidney Ace by telephone at 12:15 a.m. on 10/05/2021. Electronically Signed   By: Jeannine Boga M.D.   On: 10/06/2021 05:46   Result Date: 10/06/2021 CLINICAL DATA:  Initial evaluation for acute speech difficulty. EXAM: MRI HEAD WITHOUT CONTRAST MRA HEAD WITHOUT CONTRAST MRA NECK WITHOUT CONTRAST TECHNIQUE: Multiplanar, multiecho pulse sequences of the brain and surrounding structures were obtained without intravenous contrast. Angiographic images of the Circle of Willis were obtained using MRA technique without intravenous contrast. Angiographic images of the neck were obtained using MRA technique without intravenous contrast. Carotid stenosis measurements (when applicable) are obtained utilizing NASCET criteria, using the distal internal carotid diameter as the denominator. COMPARISON:  Prior ultrasound from 08/26/2013 and CT from 10/05/2021. FINDINGS: MRI HEAD FINDINGS Brain: Generalized age-related cerebral atrophy. Scattered patchy and hazy T2/FLAIR hyperintensity involving the periventricular deep white matter both cerebral hemispheres, most consistent with chronic small vessel ischemic disease, mild in nature. Scattered patchy and confluent restricted  diffusion involving the left frontal lobe, with involvement of the left basal ganglia and insula, consistent with acute left MCA distribution infarct. Mild patchy involvement of the anterior mesial left temporal lobe noted as well. No associated hemorrhage or significant regional mass effect. No other evidence for acute or subacute ischemia. Gray-white matter differentiation otherwise maintained. Tiny remote left ACA territory infarct noted at the mesial left frontal lobe. No other areas of chronic cortical infarction. No acute or chronic intracranial blood products. No mass lesion, midline shift or mass effect. No hydrocephalus or extra-axial fluid collection. Pituitary gland suprasellar region within normal limits. Vascular: Abnormal flow void throughout the visualized left ICA to the terminus, consistent with slow flow and/or occlusion. Susceptibility artifact within proximal left MCA branches at the left sylvian fissure, consistent with intraluminal thrombus (series 13, image 26). Remainder of the intracranial vascular flow voids are maintained. Skull and upper cervical spine: Craniocervical junction within normal limits. Bone marrow signal intensity normal. No scalp soft tissue abnormality. Sinuses/Orbits: Globes and orbital soft tissues within normal limits. Mild scattered mucosal thickening noted about the right maxillary sinus. Paranasal sinuses are otherwise clear. Trace left mastoid effusion noted, of doubtful significance. Other: None. MRA HEAD FINDINGS ANTERIOR CIRCULATION: Right ICA widely patent to the terminus without stenosis or other abnormality. Intermittent attenuated flow seen within the visualized mid cervical left ICA and cervical petrous junction. Otherwise, the left ICA is largely occluded to the terminus. Right A1 segment patent. Normal anterior communicating artery complex. Both ACAs remain patent and well perfused. Left A1 not well seen, and could be occluded. Scant attenuated flow seen  within the left M1 segment proximally. No significant flow seen within the left MCA branches distally. Findings presumably acute given the presence of the acute infarct. POSTERIOR CIRCULATION: Both vertebral arteries patent to the vertebrobasilar junction without stenosis. Left vertebral artery dominant. Both PICA patent. Basilar patent to its distal aspect without stenosis. Superior cerebellar arteries patent bilaterally.  Both PCAs primarily supplied via the basilar well perfused or distal aspects. No visible aneurysm. MRA NECK FINDINGS AORTIC ARCH: Partially visualized aortic arch normal in caliber with standard 3 vessel morphology. No visible stenosis about the origin the great vessels. RIGHT CAROTID SYSTEM: Right CCA patent without stenosis. Atheromatous change about the right carotid bulb/proximal right ICA with associated stenosis of up to 50% by NASCET criteria. Right ICA patent distally without stenosis or evidence for dissection. LEFT CAROTID SYSTEM: Left CCA patent to the bifurcation. 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 1.3 cm flow gap is present. Some attenuated flow seen distally within the cervical left ICA, which then largely reoccludes at the skull base. VERTEBRAL ARTERIES: Both vertebral arteries arise from the subclavian arteries. Neither vertebral artery origin well assessed on this exam. Visualized portions of the vertebral arteries patent without stenosis or evidence for dissection. IMPRESSION: MRI HEAD IMPRESSION: 1. 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 No associated hemorrhage or significant regional mass effect. 2. Loss of normal flow voids throughout the left ICA and MCA, consistent with slow flow and/or occlusion. 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 3. Underlying mild chronic microvascular ischemic disease. Tiny remote left ACA distribution infarct. MRA  HEAD IMPRESSION: 1. 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 Left A1 segment not well seen either, also likely occluded. 2. Otherwise wide patency of the major intracranial arterial circulation. No other hemodynamically significant or correctable stenosis. MRA NECK IMPRESSION: 1. 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 Some attenuated flow seen distally within the cervical left ICA, with subsequent reocclusion by the skull base. 2. 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 Otherwise wide patency of the right carotid artery system. 3. Wide patency of the vertebral arteries within the neck. The clinician taking care of this patient has been paged regarding these results, currently awaiting a call back. Results will be conveyed as soon as possible. Electronically Signed: By: Jeannine Boga M.D. On: 10/06/2021 00:07   DG Chest Port 1 View  Result Date: 10/05/2021 CLINICAL DATA:  Stroke, TIA EXAM: PORTABLE CHEST 1 VIEW COMPARISON:  07/11/2017 FINDINGS: Heart and mediastinal contours are within normal limits. No focal opacities or effusions. No acute bony abnormality. IMPRESSION: No active disease. Electronically Signed   By: Rolm Baptise M.D.   On: 10/05/2021 17:21   CT HEAD WO CONTRAST  Result Date: 10/05/2021 CLINICAL DATA:  Mental status change, unknown cause EXAM: CT HEAD WITHOUT CONTRAST TECHNIQUE: Contiguous axial images were obtained from the base of the skull through the vertex without intravenous contrast. RADIATION DOSE REDUCTION: This exam was performed according to the departmental dose-optimization program which includes automated exposure control, adjustment of the mA and/or kV according to patient size and/or use of iterative reconstruction technique. COMPARISON:  None Available. FINDINGS: Brain: Mild  hypoattenuation in the region of the inferior left frontal lobe and anterior left insula. No acute hemorrhage, mass lesion, midline shift, or hydrocephalus. No visible extra-axial fluid collection. Vascular: No hyperdense vessel identified. Calcific intracranial atherosclerosis. Skull: No acute fracture. Sinuses/Orbits: Clear visualized sinuses. No acute orbital findings. Other: No mastoid effusions. IMPRESSION: Mild hypoattenuation in the region of the inferior left frontal lobe and anterior left insula. This may be artifactual given  suspected streak artifact in this region; however, if there is concern for left MCA territory infarct then recommend MRI for more sensitive evaluation. Electronically Signed   By: Margaretha Sheffield M.D.   On: 10/05/2021 15:20        Scheduled Meds:   stroke: early stages of recovery book   Does not apply Once   aspirin EC  81 mg Oral Daily   cholecalciferol  1,000 Units Oral Daily   clopidogrel  75 mg Oral Daily   enoxaparin (LOVENOX) injection  40 mg Subcutaneous Q24H   insulin aspart  0-15 Units Subcutaneous TID WC   insulin aspart  0-5 Units Subcutaneous QHS   vitamin B-12  500 mcg Oral Daily   Continuous Infusions:   LOS: 0 days    Time spent: 35 minutes    Barb Merino, MD [facility] Pager [phone]

## 2021-10-06 NOTE — Consult Note (Addendum)
NEURO HOSPITALIST CONSULT NOTE   Requesting physician: Dr. Arville Care  Reason for Consult: Acute left MCA territory ischemic infarction  History obtained from:  Family and Chart     HPI:                                                                                                                                          Larnell Sitzman is an 76 y.o. male with a PMHx of carotid atherosclerosis, DM, HLD, HTN, tachycardia and prostate cancer who presented to the ED yesterday afternoon with c/c of dysphasia, right facial droop and RUE weakness that had started sometime between Wednesday afternoon and early Thursday AM. LKN was 3:30 PM on Wednesday when family had last spoken with him. He also had complained of a headache, which he had first noticed on awakening Thursday morning. Family had called him at about 1 AM and had noticed that he was having a hard time getting his words out. Patient was unable to recall when he first noticed his own weakness.   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 Acute to subacute occlusion of the distal left ICA and MCA were also noted.   Past Medical History:  Diagnosis Date   Acid reflux    Arthritis    hands   Carotid atherosclerosis    Diabetes mellitus without complication (HCC)    Hyperlipemia    Hypertension    Prostate cancer (HCC)    history   Tachycardia     Past Surgical History:  Procedure Laterality Date   APPENDECTOMY  1973   CHOLECYSTECTOMY  2013   PROSTATECTOMY  2012    Family History  Problem Relation Age of Onset   Dementia Mother    High Cholesterol Mother    Hypertension Mother    Arthritis Mother    Hyperlipidemia Mother    Heart disease Father    Heart attack Father    Arthritis Maternal Grandmother    Cancer Maternal Grandmother        colon   Prostate cancer Brother    Cancer Brother        prostate   Cancer Maternal Uncle         colon   COPD Maternal Uncle    Stroke Paternal Grandmother    Diabetes Paternal Grandmother    Aneurysm Paternal Grandfather             Social History:  reports that he quit smoking about 24 years ago. His smoking use included cigarettes. He has a 40.00 pack-year smoking history. He has quit using smokeless tobacco. He reports that he does not drink alcohol and does not use drugs.  Allergies  Allergen Reactions   Amoxicillin Swelling  Statins Other (See Comments)    Affects the liver   Welchol [Colesevelam Hcl] Other (See Comments)    affects the liver    MEDICATIONS:                                                                                                                     Prior to Admission: (Not in a hospital admission)  Scheduled:   stroke: early stages of recovery book   Does not apply Once   aspirin EC  81 mg Oral Daily   cholecalciferol  1,000 Units Oral Daily   enoxaparin (LOVENOX) injection  40 mg Subcutaneous Q24H   insulin aspart  0-15 Units Subcutaneous TID WC   insulin aspart  0-5 Units Subcutaneous QHS   vitamin B-12  500 mcg Oral Daily   Continuous:   ROS:                                                                                                                                       Unable to obtain due to aphasia.   Blood pressure (!) 155/102, pulse 91, temperature 98 F (36.7 C), temperature source Oral, resp. rate 20, height 5\' 7"  (1.702 m), weight 79.8 kg, SpO2 97 %.   General Examination:                                                                                                       Physical Exam  HEENT-  [place]/AT   Lungs- Respirations unlabored Extremities- Warm and well perfused  Neurological Examination Mental Status: Awake with decreased level of alertness. Mute without any speech output or meaningful attempts to speak with any questions, but does nod and shake head to communicate yes/no responses to questions.  Cranial  Nerves: II: PERRL. Does not blink to threat in left or right visual field, but will fixate on an object and track to the left when asked.   III,IV, VI: Right ptosis. Eyes are conjugate with left gaze preference.  Can track horizontally when eyes are to left of midline. Can slightly cross midline to the right when tracking, but eyes rapidly rotate back to the left of midline.  V: Nods head yes when asked if cold temp stimulus is same on right and left VII: Prominent right facial droop includes decreased brow-furrowing on the right.  VIII: Hearing intact to commands.  IX,X: Gag reflex deferred XI: Weak shoulder shrug on the right XII: Midline tongue extension Motor: RUE 0/5 deltoid, 2/5 biceps, 1/5 triceps, 2/5 grip and finger extension.  RLE 2/5 HF. 3/5 KE.  LUE and LLE 5/5 Sensory: Temp sensation is equal in BUE and BLE.  Deep Tendon Reflexes: 2-3+ upper and lower extremities, brisker on the right.  Plantars: Right: Mute   Left: Downgoing Cerebellar: No ataxia with left FNF. Unable to perform on the right Gait: Deferred   Lab Results: Basic Metabolic Panel: Recent Labs  Lab 10/05/21 1407  NA 137  K 3.8  CL 106  CO2 23  GLUCOSE 119*  BUN 12  CREATININE 0.90  CALCIUM 8.3*    CBC: Recent Labs  Lab 10/05/21 1407  WBC 7.9  NEUTROABS 4.7  HGB 14.4  HCT 43.0  MCV 90.0  PLT 207    Cardiac Enzymes: No results for input(s): "CKTOTAL", "CKMB", "CKMBINDEX", "TROPONINI" in the last 168 hours.  Lipid Panel: Recent Labs  Lab 10/06/21 0510  CHOL 112  TRIG 105  HDL 47  CHOLHDL 2.4  VLDL 21  LDLCALC 44    Imaging: MR BRAIN WO CONTRAST  Addendum Date: 10/06/2021   ADDENDUM REPORT: 10/06/2021 05:46 ADDENDUM: Case discussed with Dr. Arville Care by telephone at 12:15 a.m. on 10/05/2021. Electronically Signed   By: Rise Mu M.D.   On: 10/06/2021 05:46   Result Date: 10/06/2021 CLINICAL DATA:  Initial evaluation for acute speech difficulty. EXAM: MRI HEAD WITHOUT  CONTRAST MRA HEAD WITHOUT CONTRAST MRA NECK WITHOUT CONTRAST TECHNIQUE: Multiplanar, multiecho pulse sequences of the brain and surrounding structures were obtained without intravenous contrast. Angiographic images of the Circle of Willis were obtained using MRA technique without intravenous contrast. Angiographic images of the neck were obtained using MRA technique without intravenous contrast. Carotid stenosis measurements (when applicable) are obtained utilizing NASCET criteria, using the distal internal carotid diameter as the denominator. COMPARISON:  Prior ultrasound from 08/26/2013 and CT from 10/05/2021. FINDINGS: MRI HEAD FINDINGS Brain: Generalized age-related cerebral atrophy. Scattered patchy and hazy T2/FLAIR hyperintensity involving the periventricular deep white matter both cerebral hemispheres, most consistent with chronic small vessel ischemic disease, mild in nature. Scattered patchy and confluent restricted diffusion involving the left frontal lobe, with involvement of the left basal ganglia and insula, consistent with acute left MCA distribution infarct. Mild patchy involvement of the anterior mesial left temporal lobe noted as well. No associated hemorrhage or significant regional mass effect. No other evidence for acute or subacute ischemia. Gray-white matter differentiation otherwise maintained. Tiny remote left ACA territory infarct noted at the mesial left frontal lobe. No other areas of chronic cortical infarction. No acute or chronic intracranial blood products. No mass lesion, midline shift or mass effect. No hydrocephalus or extra-axial fluid collection. Pituitary gland suprasellar region within normal limits. Vascular: Abnormal flow void throughout the visualized left ICA to the terminus, consistent with slow flow and/or occlusion. Susceptibility artifact within proximal left MCA branches at the left sylvian fissure, consistent with intraluminal thrombus (series 13, image 26). Remainder  of the intracranial vascular flow voids are maintained. Skull and upper cervical spine:  Craniocervical junction within normal limits. Bone marrow signal intensity normal. No scalp soft tissue abnormality. Sinuses/Orbits: Globes and orbital soft tissues within normal limits. Mild scattered mucosal thickening noted about the right maxillary sinus. Paranasal sinuses are otherwise clear. Trace left mastoid effusion noted, of doubtful significance. Other: None. MRA HEAD FINDINGS ANTERIOR CIRCULATION: Right ICA widely patent to the terminus without stenosis or other abnormality. Intermittent attenuated flow seen within the visualized mid cervical left ICA and cervical petrous junction. Otherwise, the left ICA is largely occluded to the terminus. Right A1 segment patent. Normal anterior communicating artery complex. Both ACAs remain patent and well perfused. Left A1 not well seen, and could be occluded. Scant attenuated flow seen within the left M1 segment proximally. No significant flow seen within the left MCA branches distally. Findings presumably acute given the presence of the acute infarct. POSTERIOR CIRCULATION: Both vertebral arteries patent to the vertebrobasilar junction without stenosis. Left vertebral artery dominant. Both PICA patent. Basilar patent to its distal aspect without stenosis. Superior cerebellar arteries patent bilaterally. Both PCAs primarily supplied via the basilar well perfused or distal aspects. No visible aneurysm. MRA NECK FINDINGS AORTIC ARCH: Partially visualized aortic arch normal in caliber with standard 3 vessel morphology. No visible stenosis about the origin the great vessels. RIGHT CAROTID SYSTEM: Right CCA patent without stenosis. Atheromatous change about the right carotid bulb/proximal right ICA with associated stenosis of up to 50% by NASCET criteria. Right ICA patent distally without stenosis or evidence for dissection. LEFT CAROTID SYSTEM: Left CCA patent to the bifurcation.  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 1.3 cm flow gap is present. Some attenuated flow seen distally within the cervical left ICA, which then largely reoccludes at the skull base. VERTEBRAL ARTERIES: Both vertebral arteries arise from the subclavian arteries. Neither vertebral artery origin well assessed on this exam. Visualized portions of the vertebral arteries patent without stenosis or evidence for dissection. IMPRESSION: MRI HEAD IMPRESSION: 1. 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 No associated hemorrhage or significant regional mass effect. 2. Loss of normal flow voids throughout the left ICA and MCA, consistent with slow flow and/or occlusion. 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 3. Underlying mild chronic microvascular ischemic disease. Tiny remote left ACA distribution infarct. MRA HEAD IMPRESSION: 1. 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 Left A1 segment not well seen either, also likely occluded. 2. Otherwise wide patency of the major intracranial arterial circulation. No other hemodynamically significant or correctable stenosis. MRA NECK IMPRESSION: 1. 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 Some attenuated flow seen distally within the cervical left ICA, with subsequent reocclusion by the skull base. 2. 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 Otherwise wide patency of the right carotid artery system. 3. Wide patency of the vertebral arteries within the neck. The clinician taking care of this patient has been paged regarding these results, currently awaiting a call back. Results will be conveyed as soon as possible. Electronically Signed: By: Rise Mu M.D. On:  10/06/2021 00:07   MR ANGIO HEAD WO CONTRAST  Addendum Date: 10/06/2021   ADDENDUM REPORT: 10/06/2021 05:46 ADDENDUM: Case discussed with Dr. Arville Care by telephone at 12:15 a.m. on 10/05/2021. Electronically Signed   By: Sharlet Salina  Phill Myron M.D.   On: 10/06/2021 05:46   Result Date: 10/06/2021 CLINICAL DATA:  Initial evaluation for acute speech difficulty. EXAM: MRI HEAD WITHOUT CONTRAST MRA HEAD WITHOUT CONTRAST MRA NECK WITHOUT CONTRAST TECHNIQUE: Multiplanar, multiecho pulse sequences of the brain and surrounding structures were obtained without intravenous contrast. Angiographic images of the Circle of Willis were obtained using MRA technique without intravenous contrast. Angiographic images of the neck were obtained using MRA technique without intravenous contrast. Carotid stenosis measurements (when applicable) are obtained utilizing NASCET criteria, using the distal internal carotid diameter as the denominator. COMPARISON:  Prior ultrasound from 08/26/2013 and CT from 10/05/2021. FINDINGS: MRI HEAD FINDINGS Brain: Generalized age-related cerebral atrophy. Scattered patchy and hazy T2/FLAIR hyperintensity involving the periventricular deep white matter both cerebral hemispheres, most consistent with chronic small vessel ischemic disease, mild in nature. Scattered patchy and confluent restricted diffusion involving the left frontal lobe, with involvement of the left basal ganglia and insula, consistent with acute left MCA distribution infarct. Mild patchy involvement of the anterior mesial left temporal lobe noted as well. No associated hemorrhage or significant regional mass effect. No other evidence for acute or subacute ischemia. Gray-white matter differentiation otherwise maintained. Tiny remote left ACA territory infarct noted at the mesial left frontal lobe. No other areas of chronic cortical infarction. No acute or chronic intracranial blood products. No mass lesion, midline shift or mass effect. No  hydrocephalus or extra-axial fluid collection. Pituitary gland suprasellar region within normal limits. Vascular: Abnormal flow void throughout the visualized left ICA to the terminus, consistent with slow flow and/or occlusion. Susceptibility artifact within proximal left MCA branches at the left sylvian fissure, consistent with intraluminal thrombus (series 13, image 26). Remainder of the intracranial vascular flow voids are maintained. Skull and upper cervical spine: Craniocervical junction within normal limits. Bone marrow signal intensity normal. No scalp soft tissue abnormality. Sinuses/Orbits: Globes and orbital soft tissues within normal limits. Mild scattered mucosal thickening noted about the right maxillary sinus. Paranasal sinuses are otherwise clear. Trace left mastoid effusion noted, of doubtful significance. Other: None. MRA HEAD FINDINGS ANTERIOR CIRCULATION: Right ICA widely patent to the terminus without stenosis or other abnormality. Intermittent attenuated flow seen within the visualized mid cervical left ICA and cervical petrous junction. Otherwise, the left ICA is largely occluded to the terminus. Right A1 segment patent. Normal anterior communicating artery complex. Both ACAs remain patent and well perfused. Left A1 not well seen, and could be occluded. Scant attenuated flow seen within the left M1 segment proximally. No significant flow seen within the left MCA branches distally. Findings presumably acute given the presence of the acute infarct. POSTERIOR CIRCULATION: Both vertebral arteries patent to the vertebrobasilar junction without stenosis. Left vertebral artery dominant. Both PICA patent. Basilar patent to its distal aspect without stenosis. Superior cerebellar arteries patent bilaterally. Both PCAs primarily supplied via the basilar well perfused or distal aspects. No visible aneurysm. MRA NECK FINDINGS AORTIC ARCH: Partially visualized aortic arch normal in caliber with standard 3  vessel morphology. No visible stenosis about the origin the great vessels. RIGHT CAROTID SYSTEM: Right CCA patent without stenosis. Atheromatous change about the right carotid bulb/proximal right ICA with associated stenosis of up to 50% by NASCET criteria. Right ICA patent distally without stenosis or evidence for dissection. LEFT CAROTID SYSTEM: Left CCA patent to the bifurcation. 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 1.3 cm flow gap is present. Some attenuated flow seen distally within the cervical left ICA,  which then largely reoccludes at the skull base. VERTEBRAL ARTERIES: Both vertebral arteries arise from the subclavian arteries. Neither vertebral artery origin well assessed on this exam. Visualized portions of the vertebral arteries patent without stenosis or evidence for dissection. IMPRESSION: MRI HEAD IMPRESSION: 1. 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 No associated hemorrhage or significant regional mass effect. 2. Loss of normal flow voids throughout the left ICA and MCA, consistent with slow flow and/or occlusion. 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 3. Underlying mild chronic microvascular ischemic disease. Tiny remote left ACA distribution infarct. MRA HEAD IMPRESSION: 1. 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 Left A1 segment not well seen either, also likely occluded. 2. Otherwise wide patency of the major intracranial arterial circulation. No other hemodynamically significant or correctable stenosis. MRA NECK IMPRESSION: 1. 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 Some attenuated flow seen distally within the cervical left ICA, with subsequent reocclusion by the skull base. 2. 50% atheromatous  stenosis at the origin of the cervical right ICA. Otherwise wide patency of the right carotid artery system. 3. Wide patency of the vertebral arteries within the neck. The clinician taking care of this patient has been paged regarding these results, currently awaiting a call back. Results will be conveyed as soon as possible. Electronically Signed: By: Rise Mu M.D. On: 10/06/2021 00:07   MR ANGIO NECK W WO CONTRAST  Addendum Date: 10/06/2021   ADDENDUM REPORT: 10/06/2021 05:46 ADDENDUM: Case discussed with Dr. Arville Care by telephone at 12:15 a.m. on 10/05/2021. Electronically Signed   By: Rise Mu M.D.   On: 10/06/2021 05:46   Result Date: 10/06/2021 CLINICAL DATA:  Initial evaluation for acute speech difficulty. EXAM: MRI HEAD WITHOUT CONTRAST MRA HEAD WITHOUT CONTRAST MRA NECK WITHOUT CONTRAST TECHNIQUE: Multiplanar, multiecho pulse sequences of the brain and surrounding structures were obtained without intravenous contrast. Angiographic images of the Circle of Willis were obtained using MRA technique without intravenous contrast. Angiographic images of the neck were obtained using MRA technique without intravenous contrast. Carotid stenosis measurements (when applicable) are obtained utilizing NASCET criteria, using the distal internal carotid diameter as the denominator. COMPARISON:  Prior ultrasound from 08/26/2013 and CT from 10/05/2021. FINDINGS: MRI HEAD FINDINGS Brain: Generalized age-related cerebral atrophy. Scattered patchy and hazy T2/FLAIR hyperintensity involving the periventricular deep white matter both cerebral hemispheres, most consistent with chronic small vessel ischemic disease, mild in nature. Scattered patchy and confluent restricted diffusion involving the left frontal lobe, with involvement of the left basal ganglia and insula, consistent with acute left MCA distribution infarct. Mild patchy involvement of the anterior mesial left temporal lobe noted as well. No  associated hemorrhage or significant regional mass effect. No other evidence for acute or subacute ischemia. Gray-white matter differentiation otherwise maintained. Tiny remote left ACA territory infarct noted at the mesial left frontal lobe. No other areas of chronic cortical infarction. No acute or chronic intracranial blood products. No mass lesion, midline shift or mass effect. No hydrocephalus or extra-axial fluid collection. Pituitary gland suprasellar region within normal limits. Vascular: Abnormal flow void throughout the visualized left ICA to the terminus, consistent with slow flow and/or occlusion. Susceptibility artifact within proximal left MCA branches at the left sylvian fissure, consistent with intraluminal thrombus (series 13, image 26). Remainder of the intracranial vascular  flow voids are maintained. Skull and upper cervical spine: Craniocervical junction within normal limits. Bone marrow signal intensity normal. No scalp soft tissue abnormality. Sinuses/Orbits: Globes and orbital soft tissues within normal limits. Mild scattered mucosal thickening noted about the right maxillary sinus. Paranasal sinuses are otherwise clear. Trace left mastoid effusion noted, of doubtful significance. Other: None. MRA HEAD FINDINGS ANTERIOR CIRCULATION: Right ICA widely patent to the terminus without stenosis or other abnormality. Intermittent attenuated flow seen within the visualized mid cervical left ICA and cervical petrous junction. Otherwise, the left ICA is largely occluded to the terminus. Right A1 segment patent. Normal anterior communicating artery complex. Both ACAs remain patent and well perfused. Left A1 not well seen, and could be occluded. Scant attenuated flow seen within the left M1 segment proximally. No significant flow seen within the left MCA branches distally. Findings presumably acute given the presence of the acute infarct. POSTERIOR CIRCULATION: Both vertebral arteries patent to the  vertebrobasilar junction without stenosis. Left vertebral artery dominant. Both PICA patent. Basilar patent to its distal aspect without stenosis. Superior cerebellar arteries patent bilaterally. Both PCAs primarily supplied via the basilar well perfused or distal aspects. No visible aneurysm. MRA NECK FINDINGS AORTIC ARCH: Partially visualized aortic arch normal in caliber with standard 3 vessel morphology. No visible stenosis about the origin the great vessels. RIGHT CAROTID SYSTEM: Right CCA patent without stenosis. Atheromatous change about the right carotid bulb/proximal right ICA with associated stenosis of up to 50% by NASCET criteria. Right ICA patent distally without stenosis or evidence for dissection. LEFT CAROTID SYSTEM: Left CCA patent to the bifurcation. 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 1.3 cm flow gap is present. Some attenuated flow seen distally within the cervical left ICA, which then largely reoccludes at the skull base. VERTEBRAL ARTERIES: Both vertebral arteries arise from the subclavian arteries. Neither vertebral artery origin well assessed on this exam. Visualized portions of the vertebral arteries patent without stenosis or evidence for dissection. IMPRESSION: MRI HEAD IMPRESSION: 1. 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 No associated hemorrhage or significant regional mass effect. 2. Loss of normal flow voids throughout the left ICA and MCA, consistent with slow flow and/or occlusion. 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 3. Underlying mild chronic microvascular ischemic disease. Tiny remote left ACA distribution infarct. MRA HEAD IMPRESSION: 1. 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 Left A1 segment not well seen either, also likely occluded. 2.  Otherwise wide patency of the major intracranial arterial circulation. No other hemodynamically significant or correctable stenosis. MRA NECK IMPRESSION: 1. 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 Some attenuated flow seen distally within the cervical left ICA, with subsequent reocclusion by the skull base. 2. 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 Otherwise wide patency of the right carotid artery system. 3. Wide patency of the vertebral arteries within the neck. The clinician taking care of this patient has been paged regarding these results, currently awaiting a call back. Results will be conveyed as soon as possible. Electronically Signed: By: Rise Mu M.D. On: 10/06/2021 00:07   DG Chest Port 1 View  Result Date: 10/05/2021 CLINICAL DATA:  Stroke, TIA EXAM: PORTABLE CHEST 1 VIEW COMPARISON:  07/11/2017 FINDINGS: Heart and mediastinal  contours are within normal limits. No focal opacities or effusions. No acute bony abnormality. IMPRESSION: No active disease. Electronically Signed   By: Charlett Nose M.D.   On: 10/05/2021 17:21   CT HEAD WO CONTRAST  Result Date: 10/05/2021 CLINICAL DATA:  Mental status change, unknown cause EXAM: CT HEAD WITHOUT CONTRAST TECHNIQUE: Contiguous axial images were obtained from the base of the skull through the vertex without intravenous contrast. RADIATION DOSE REDUCTION: This exam was performed according to the departmental dose-optimization program which includes automated exposure control, adjustment of the mA and/or kV according to patient size and/or use of iterative reconstruction technique. COMPARISON:  None Available. FINDINGS: Brain: Mild hypoattenuation in the region of the inferior left frontal lobe and anterior left insula. No acute hemorrhage, mass lesion, midline shift, or hydrocephalus. No visible extra-axial fluid collection. Vascular: No hyperdense vessel  identified. Calcific intracranial atherosclerosis. Skull: No acute fracture. Sinuses/Orbits: Clear visualized sinuses. No acute orbital findings. Other: No mastoid effusions. IMPRESSION: Mild hypoattenuation in the region of the inferior left frontal lobe and anterior left insula. This may be artifactual given suspected streak artifact in this region; however, if there is concern for left MCA territory infarct then recommend MRI for more sensitive evaluation. Electronically Signed   By: Feliberto Harts M.D.   On: 10/05/2021 15:20     Assessment: 76 year old male presenting with acute aphasia - Exam reveals findings referable to the left Broca's and left putamen/caudate acute ischemic infarctions seen on MRI.  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 No associated hemorrhage or significant regional mass effect. Loss of normal flow voids throughout the left ICA and MCA, consistent with slow flow and/or occlusion. 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 Underlying mild chronic microvascular ischemic disease. Tiny remote left ACA distribution infarct. - MRA head: Interval near complete occlusion of the left ICA and MCA, presumably acute in nature given the presence of the acute left MCA territory infarct. Left A1 segment not well seen either, also likely occluded. Otherwise wide patency of the major intracranial arterial circulation.  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 Some attenuated flow seen distally within the cervical left ICA, with subsequent reocclusion by the skull base. 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 Otherwise wide patency of the right carotid artery system. Wide patency of the vertebral arteries within the neck. - EKG: Sinus tachycardia; Borderline left axis  deviation; Low voltage, precordial leads Borderline prolonged QT interval - Has failed ASA monotherapy - Stroke risk factors: Carotid atherosclerosis, DM, HLD, HTN and cancer history.   Recommendations: - TTE is pending - Cardiac telemetry - Permissive HTN for a 24 hour period following onset of symptoms - Add Plavix to ASA. Continue DAPT indefinitely - Atorvastatin 40 mg po qd - Glycemic control - PT/OT/Speech - Most likely would benefit from CIR - HgbA1c, fasting lipid panel - Risk factor modification - Frequent neuro checks - NPO until passes stroke swallow screen - Stroke diagnosis, prognosis and plan of care discussed with family at the bedside. All questions answered  Addendum: TTE:  1. Left ventricular ejection fraction, by estimation, is 55 to 60%. The  left ventricle has normal function. The left ventricle has no regional  wall motion abnormalities. Left ventricular diastolic parameters are  consistent with Grade I diastolic dysfunction (impaired relaxation).  2. Right ventricular systolic function is normal. The right ventricular  size is normal. There is normal pulmonary artery systolic pressure. The estimated right ventricular systolic pressure is 14.7 mmHg.   3. The mitral valve is normal in structure. No evidence of mitral valve  regurgitation. No evidence of mitral stenosis.   4. The aortic valve is normal in structure. Aortic valve regurgitation is  not visualized. Aortic valve sclerosis is present, with no evidence of  aortic valve stenosis.   5. The inferior vena cava is normal in size with greater than 50%  respiratory variability, suggesting right atrial pressure of 3 mmHg.   Addendum: - Stroke work up complete - Continue with above management plan - Neurohospitalist service will continue to follow on a PRN basis.  Electronically signed: Dr. Caryl Pina 10/06/2021, 8:13 AM

## 2021-10-06 NOTE — Evaluation (Signed)
Clinical/Bedside Swallow Evaluation Patient Details  Name: Gerald Bauer MRN: [id number] Date of Birth: 01-25-1946  Today's Date: 10/06/2021 Time: SLP Start Time (ACUTE ONLY): 24 SLP Stop Time (ACUTE ONLY): 1105 SLP Time Calculation (min) (ACUTE ONLY): 60 min  Past Medical History:  Past Medical History:  Diagnosis Date   Acid reflux    Arthritis    hands   Carotid atherosclerosis    Diabetes mellitus without complication (HCC)    Hyperlipemia    Hypertension    Prostate cancer ([place])    history   Tachycardia    Past Surgical History:  Past Surgical History:  Procedure Laterality Date   APPENDECTOMY  1973   CHOLECYSTECTOMY  2013   PROSTATECTOMY  2012   HPI:  Pt  is a 76 year old male with history of left-sided carotid stenosis, non-insulin-dependent diabetes mellitus type 2, PAD, GERD, mild COPD, history of tobacco use, hyperlipidemia, hypertension, who presents emergency department for chief concerns of facial droop, expressive aphasia, and R sided weakness.  MRI/MRA Head and Neck Imaging: MRI HEAD IMPRESSION:     1. 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 No associated hemorrhage or significant regional mass effect.  2. Loss of normal flow voids throughout the left ICA and MCA,  consistent with slow flow and/or occlusion. Susceptibility artifact  within proximal left MCA branches consistent with intraluminal  thrombus.  3. Underlying mild chronic microvascular ischemic disease. Tiny  remote left ACA distribution infarct.     MRA HEAD IMPRESSION:   "1. Interval near complete occlusion of the left ICA and MCA,  presumably acute in nature given the presence of the acute left MCA  territory infarct. Left A1 segment not well seen either, also likely  occluded.  2. Otherwise wide patency of the major intracranial arterial  circulation. No other hemodynamically significant or correctable  stenosis.     MRA NECK IMPRESSION:  1. Severe near occlusive stenosis  at the origin of the cervical left  ICA with associated 1.3 cm flow gap. Some attenuated flow seen  distally within the cervical left ICA, with subsequent reocclusion  by the skull base.  2. 50% atheromatous stenosis at the origin of the cervical right  ICA. Otherwise wide patency of the right carotid artery system.  3. Wide patency of the vertebral arteries within the neck.".    Assessment / Plan / Recommendation  Clinical Impression  Pt seen for BSE this morning post OT session; Neurology visit also. Pt presented as Nonverbal, silent sitting in bed though Multiple family members were present. His eyes were closed, but he would open upon command briefly; also followed basic commands to direct command -- noted slight motor perseveration. He seemed disengaged from his environment as if too much stimulation.  Pt appears to present w/ oropharyngeal phase dysphagia w/ suspected Neuromuscular impact in setting of new Left MCA CVA. His disengagement during po tasks requiring verbal/tactile cues and the oropharyngeal phase swallow deficits increase his risk for aspiration, thus risk for aspiration pneumonia. He required Mod tactile/verbal/visual cues for orientation to bolus presentation, follow through w/ tasks, and self-feeding support. He maintained eyes closed b/t trials and during much of the evaluation. 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   He helped to feed self w/ cues and support. Noted slighty impulsive self-feeding behaviors during cup drinking. Pt consumed trials of single ice chips, thin and Nectar liquids via spoon/Cup/straw, purees, and soft solids(1) supported hand-over-hand and/or  w/ cues.  Immediate, overt clinical s/s of aspiration noted w/ trials of thin liquids(coughing) 2/3 trials; delayed cough the remaining trial. W/ trials of Nectar liquids via cup/spoon and foods, no immediate cough and no decline in respiratory  presentation noted during/post trials. O2 sats remained upper 90s, no multiple swallows. Laryngeal excursion appeared mildly decreased during swallowing(intermittent). Suspect decreased attention to task as well as delayed pharyngeal swallow initiation could be impacting safety and success of pharyngeal swallow w/ thin liquids.  Oral phase was c/b decreased oral awareness during oral prep stage; slightly impulsive drinking behavior. Also noted was slow, deliberate bolus management and oral clearing of boluses given. Vertical munching w/ increased textured trial of food occurred -- bolus was removed from mouth d/t lack of sufficient mastication of it for A-P transfer. He required increased Time to fully clear bolus residue secondary to bolus residue noted along R lateral side of tongue -- encouraged to use a lingual sweep and Dry swallow as needed. Suspect decreased oral awareness as well as R lingual weakness. Noted R labial/corner of mouth residue as well as R anterior leakage during trials of liquids. OM exam revealed Moderate R labial and lingual weakness and decreased sensation; CN 5, 7, 12 involvement. R lingual deviation++.   In setting of oropharyngeal phase dysphagia and risk for aspiration/aspiration pneumonia, recommend dysphagia level 1 diet(Puree w/ gravies to moisten) w/ Nectar consistency liquids via TSP or CUP; aspiration precautions; 100% Supervision at all meals - monitor for oral clearing. Engage pt in self-feeding at meals. Pills WHOLE vs Crushed in puree for safety. Reduce Distractions during meals; feeding support d/t RUE weakness. NSG/MD updated.   ST services to f/u w/ toleration of diet; trials to upgrade; objective swallow assessment TBD. Also f/u w/ Cognitive-linguistic evaluation to address language needs. Pt would be appropriate for Acute Inpt Rehab CIR to address needs.  SLP Visit Diagnosis: Dysphagia, oropharyngeal phase (R13.12) (in setting of L MCA; also Expressive Aphasia)     Aspiration Risk  Moderate aspiration risk;Risk for inadequate nutrition/hydration    Diet Recommendation   dysphagia level 1 diet(Puree w/ gravies to moisten) w/ Nectar consistency liquids via TSP or CUP; aspiration precautions; 100% Supervision at all meals - monitor for oral clearing. Engage pt in self-feeding at meals. Reduce Distractions during meals; feeding support.  Medication Administration: Whole meds with puree (vs need to Crush in puree)    Other  Recommendations Recommended Consults:  (Dietician f/u) Oral Care Recommendations: Oral care BID;Oral care before and after PO;Staff/trained caregiver to provide oral care (support pt) Other Recommendations: Order thickener from pharmacy;Prohibited food (jello, ice cream, thin soups);Remove water pitcher;Have oral suction available    Recommendations for follow up therapy are one component of a multi-disciplinary discharge planning process, led by the attending physician.  Recommendations may be updated based on patient status, additional functional criteria and insurance authorization.  Follow up Recommendations Acute inpatient rehab (3hours/day) (CIR)      Assistance Recommended at Discharge Frequent or constant Supervision/Assistance  Functional Status Assessment Patient has had a recent decline in their functional status and demonstrates the ability to make significant improvements in function in a reasonable and predictable amount of time.  Frequency and Duration min 3x week  2 weeks       Prognosis Prognosis for Safe Diet Advancement: Good Barriers to Reach Goals: Language deficits;Severity of deficits;Time post onset;Behavior (Expressive Aphasia apparent) Barriers/Prognosis Comment: good Family support      Swallow Study   General Date of Onset:  10/05/21 HPI: Pt  is a 76 year old male with history of left-sided carotid stenosis, non-insulin-dependent diabetes mellitus type 2, PAD, GERD, mild COPD, history of tobacco use,  hyperlipidemia, hypertension, who presents emergency department for chief concerns of facial droop, expressive aphasia, and R sided weakness.  MRI/MRA Head and Neck Imaging: MRI HEAD IMPRESSION:     1. 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 No associated hemorrhage or significant regional mass effect.  2. Loss of normal flow voids throughout the left ICA and MCA,  consistent with slow flow and/or occlusion. Susceptibility artifact  within proximal left MCA branches consistent with intraluminal  thrombus.  3. Underlying mild chronic microvascular ischemic disease. Tiny  remote left ACA distribution infarct.     MRA HEAD IMPRESSION:   "1. Interval near complete occlusion of the left ICA and MCA,  presumably acute in nature given the presence of the acute left MCA  territory infarct. Left A1 segment not well seen either, also likely  occluded.  2. Otherwise wide patency of the major intracranial arterial  circulation. No other hemodynamically significant or correctable  stenosis.     MRA NECK IMPRESSION:  1. 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 Some attenuated flow seen  distally within the cervical left ICA, with subsequent reocclusion  by the skull base.  2. 50% atheromatous stenosis at the origin of the cervical right  ICA. Otherwise wide patency of the right carotid artery system.  3. Wide patency of the vertebral arteries within the neck.". Type of Study: Bedside Swallow Evaluation Previous Swallow Assessment: none Diet Prior to this Study: Regular;Thin liquids Temperature Spikes Noted: No Respiratory Status: Room air (wbc 7.9) History of Recent Intubation: No Behavior/Cognition: Cooperative;Pleasant mood;Requires cueing (Language deficits -- apparent expressive deficits/Broca's Aphasia. Follows simple commands; eyes closed) Oral Cavity Assessment: Within Functional Limits Oral Care Completed by SLP:  Yes Oral Cavity - Dentition: Adequate natural dentition Vision:  (unsure - eyes closed often) Self-Feeding Abilities: Able to feed self (using left hand - R hand dominent) Patient Positioning: Upright in bed (needed support) Baseline Vocal Quality:  (nonverbal -- did not attempt to verbalize though moved lips slight x2 w/ possible attempted response to name) Volitional Cough: Cognitively unable to elicit Volitional Swallow: Unable to elicit    Oral/Motor/Sensory Function Overall Oral Motor/Sensory Function: Moderate impairment Facial ROM: Reduced right;Suspected CN VII (facial) dysfunction (mild) Facial Symmetry: Abnormal symmetry right;Suspected CN VII (facial) dysfunction (mild) Facial Strength: Reduced right;Suspected CN VII (facial) dysfunction (mild) Facial Sensation: Reduced right;Suspected CN V (Trigeminal) dysfunction Lingual ROM: Reduced right;Suspected CN XII (hypoglossal) dysfunction (mod) Lingual Symmetry: Abnormal symmetry right;Suspected CN XII (hypoglossal) dysfunction (mod) Lingual Strength: Reduced;Suspected CN XII (hypoglossal) dysfunction (anterior; R lateral - mod) Lingual Sensation: Reduced;Suspected CN VII (facial) dysfunction-anterior 2/3 tongue Velum:  (CNT) Mandible:  (vertical munching pattern noted)   Ice Chips Ice chips: Impaired Presentation: Spoon (fed; 1 trial) Oral Phase Impairments: Reduced lingual movement/coordination;Poor awareness of bolus Oral Phase Functional Implications: Prolonged oral transit;Right lateral sulci pocketing Pharyngeal Phase Impairments:  (none)   Thin Liquid Thin Liquid: Impaired Presentation: Cup;Self Fed;Straw (1 via cup; 2 via straw; supported pt w/ holding cup) Oral Phase Impairments: Reduced lingual movement/coordination;Poor awareness of bolus;Reduced labial seal Oral Phase Functional Implications: Right anterior spillage Pharyngeal  Phase Impairments: Suspected delayed Swallow;Cough - Immediate (x2/3 trials) Other Comments:  slighty impulsive self-feeding behaivor    Nectar Thick Nectar Thick Liquid:  Impaired Presentation: Cup;Self Fed;Spoon (~3 ozs; supported) Oral Phase Impairments: Reduced lingual movement/coordination;Poor awareness of bolus;Reduced labial seal Oral phase functional implications: Right anterior spillage Pharyngeal Phase Impairments: Suspected delayed Swallow (no cough or throat clearing)   Honey Thick Honey Thick Liquid: Not tested   Puree Puree: Impaired Presentation: Spoon (fed; 12+ trials) Oral Phase Impairments: Reduced labial seal;Reduced lingual movement/coordination;Poor awareness of bolus Oral Phase Functional Implications:  (R labial residue/corner of mouth) Pharyngeal Phase Impairments:  (none)   Solid     Solid: Impaired Presentation: Spoon (fed; 1 trial) Oral Phase Impairments: Poor awareness of bolus;Reduced lingual movement/coordination;Impaired mastication;Reduced labial seal Oral Phase Functional Implications:  (R corner of mouth residue) Pharyngeal Phase Impairments:  (n/a - bolus removed)         Orinda Kenner, MS, CCC-SLP Speech Language Pathologist Rehab Services; [place] [phone] (ascom)  Gerald Bauer 10/06/2021,3:22 PM

## 2021-10-06 NOTE — Progress Notes (Signed)
*  PRELIMINARY RESULTS* Echocardiogram 2D Echocardiogram has been performed.  Sherrie Sport 10/06/2021, 2:06 PM

## 2021-10-06 NOTE — Progress Notes (Signed)
? ?  Inpatient Rehab Admissions Coordinator : ? ?Per therapy recommendations, patient was screened for CIR candidacy by Anav Lammert RN MSN.  At this time patient appears to be a potential candidate for CIR. I will place a rehab consult per protocol for full assessment. Please call me with any questions. ? ?Zitlaly Malson RN MSN ?Admissions Coordinator ?[phone] ?  ?

## 2021-10-06 NOTE — ED Notes (Signed)
Pt brief changed of urine

## 2021-10-07 LAB — GLUCOSE, CAPILLARY
Glucose-Capillary: 178 mg/dL — ABNORMAL HIGH (ref 70–99)
Glucose-Capillary: 228 mg/dL — ABNORMAL HIGH (ref 70–99)
Glucose-Capillary: 122 mg/dL — ABNORMAL HIGH (ref 70–99)
Glucose-Capillary: 220 mg/dL — ABNORMAL HIGH (ref 70–99)

## 2021-10-07 MED ORDER — AMLODIPINE BESYLATE 5 MG PO TABS
5.0000 mg | ORAL_TABLET | Freq: Every day | ORAL | Status: DC
  Administered 2021-10-07 – 2021-10-09 (×3): 5 mg via ORAL
  Filled 2021-10-07 (×3): qty 1

## 2021-10-07 NOTE — Progress Notes (Signed)
Physical Therapy Treatment Patient Details Name: Gerald Bauer MRN: [id number] DOB: [birth year] Today's Date: 10/07/2021   History of Present Illness Pt is a 76 year old male with history of left-sided carotid stenosis, non-insulin-dependent diabetes mellitus type 2, PAD, GERD, mild COPD, history of tobacco use, hyperlipidemia, hypertension, who presents to ED for chief concerns of facial droop on the left side and R sided weakness, expressive aphasia. MD assessment includes aphasia w/ stroke-like symptoms. 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 He easily awakes and agrees to PT session. Pt continues to have expressive aphasia but does appropriately nod yes/no throughout. He presents with severe R sided weakness and deficits. Lengthy education to pt/family about ways to promote return. Recommended family sit on R side of pt to decrease pt's R neglect. Author performed and trained family on ROM to R UE/LE to decrease likelihood of contractures. MD  messaged about request for [facility] boot to apply to R ankle/foot. Pt was able to exit bed, stand to hemi walker, and ambulate ~ 12 ft with mod-max assist of 1. Elected not to trial platform walker for better access to RLE during gait. He required author's assistance to lateral wt shift to allow RLE advancement. He fatigues quickly but overall is improving. SLP arrived at conclusion of session. Pt was sitting in recliner with RN staff aware. Recommend stand pivot for all transfers until mobility improves. Highly recommend DC to CIR to address deficits while assisting pt towards PLOF. He is motivated and have supportive family who will be available to assist at DC.    Recommendations for follow up therapy are one component of a multi-disciplinary discharge planning process, led by the attending physician.   Recommendations may be updated based on patient status, additional functional criteria and insurance authorization.  Follow Up Recommendations  Acute inpatient rehab (3hours/day)     Assistance Recommended at Discharge Frequent or constant Supervision/Assistance  Patient can return home with the following Assist for transportation;Help with stairs or ramp for entrance;Two people to help with walking and/or transfers;A lot of help with bathing/dressing/bathroom;Assistance with cooking/housework   Equipment Recommendations  Other (comment) (defer to next level of care)       Precautions / Restrictions Precautions Precautions: Fall Restrictions Weight Bearing Restrictions: No     Mobility  Bed Mobility Overal bed mobility: Needs Assistance Bed Mobility: Supine to Sit, Rolling, Sidelying to Sit Rolling: Min guard Sidelying to sit: Min assist, Mod assist Supine to sit: Min assist, Mod assist     General bed mobility comments: pt was able to roll R to short sit with increased time and vcs for technique and sequencing    Transfers Overall transfer level: Needs assistance Equipment used: Hemi-walker Transfers: Sit to/from Stand Sit to Stand: Min assist           General transfer comment: pt stood to [employer] with min assist. Chief Strategy Officer has to assist RUE and remains HHA throughout gait training. elected not to use platform walker to allow author better access to RLE for advancement    Ambulation/Gait Ambulation/Gait assistance: Mod assist, Max assist Gait Distance (Feet): 12 Feet Assistive device: Hemi-walker Gait Pattern/deviations: Step-to pattern, Decreased step length - right, Decreased step length - left Gait velocity: decreased     General Gait Details: Pt was able to ambulate ~ 12 ft in room with making one turn. Does fatigue  quickly but puts forth great effort throughout. Pt is able to advance hemiwalker and LLE however required mod-max to assist/progress RLE to  taking steps. Author assist pt with L lateral wt shift to allow RLE advancement.    Balance Overall balance assessment: Needs assistance Sitting-balance support: Single extremity supported, Feet supported Sitting balance-Leahy Scale: Fair Sitting balance - Comments: occasional posterior LOB however mostly required CGA-close supervision   Standing balance support: Single extremity supported, During functional activity, Reliant on assistive device for balance Standing balance-Leahy Scale: Poor Standing balance comment: Pt is high fall risk even with use of AD. elected not to use platform walker for easier access to RLE for advancement      Cognition Arousal/Alertness: Awake/alert Behavior During Therapy: [facility] for tasks assessed/performed, Flat affect Overall Cognitive Status: Within Functional Limits for tasks assessed        General Comments: Pt displaying expressive difficulties but head nods to yes/no questions. Family present to confirm baseline. Pt follows commands with increased time.           General Comments General comments (skin integrity, edema, etc.): Lengthy discussion and education with pt and family about ROM to RLE/RUE, need to promote R cervical rotation and to facilitate pt looking R and moving R side to promote return in function while decreasing R neglect. Pt nods to understanding and family able to use teach back to confirm understanding. Pt will require extensive PT going forward. Highly recommend CIR level rehab at DC to maximize independence while assist pt with returning towards PLOF. Gentle ROM performed to RUE/RLE      Pertinent Vitals/Pain Pain Assessment Pain Assessment: No/denies pain     PT Goals (current goals can now be found in the care plan section) Acute Rehab PT Goals Patient Stated Goal: none stated Progress towards PT goals: Progressing toward goals    Frequency    7X/week      PT Plan Current plan remains appropriate        AM-PAC PT "6 Clicks" Mobility   Outcome Measure  Help needed turning from your back to your side while in a flat bed without using bedrails?: A Little Help needed moving from lying on your back to sitting on the side of a flat bed without using bedrails?: A Little Help needed moving to and from a bed to a chair (including a wheelchair)?: A Lot Help needed standing up from a chair using your arms (e.g., wheelchair or bedside chair)?: A Lot Help needed to walk in hospital room?: A Lot Help needed climbing 3-5 steps with a railing? : Total 6 Click Score: 13    End of Session Equipment Utilized During Treatment: Gait belt Activity Tolerance: Patient tolerated treatment well;Patient limited by fatigue Patient left: in chair;with call bell/phone within reach;with family/visitor present Nurse Communication: Mobility status PT Visit Diagnosis: Other symptoms and signs involving the nervous system (R29.898);Unsteadiness on feet (R26.81);Muscle weakness (generalized) (M62.81);Hemiplegia and hemiparesis Hemiplegia - Right/Left: Right Hemiplegia - dominant/non-dominant: Dominant Hemiplegia - caused by: Cerebral infarction     Time: 0947-0962 PT Time Calculation (min) (ACUTE ONLY): 30 min  Charges:  $Gait Training: 8-22 mins $Neuromuscular Re-education: 8-22 mins                     Julaine Fusi PTA 10/07/21, 9:16 AM

## 2021-10-07 NOTE — Progress Notes (Signed)
   10/06/21 2000  Neurological  Neuro (WDL) X  Orientation Level Other (comment) (UTA, pt nonverbal)  Cognition Follows commands  Speech Nods/gestures appropriately  R Pupil Size (mm) 3  R Pupil Shape Round  R Pupil Reaction Brisk  L Pupil Size (mm) 3  L Pupil Shape Round  L Pupil Reaction Brisk  Motor Function/Sensation Assessment Grip;Dorsiflexion;Plantar flexion;Motor response;Sensation;Motor strength  R Hand Grip Absent  L Hand Grip Strong  R Foot Dorsiflexion Weak  L Foot Dorsiflexion Strong  R Foot Plantar Flexion Weak  L Foot Plantar Flexion Strong  RUE Motor Response No movement to painful stimulus  RUE Sensation Decreased  RUE Motor Strength 1  LUE Motor Response Purposeful movement  LUE Sensation Full sensation  LUE Motor Strength 4  RLE Motor Response Responds to commands  RLE Sensation Full sensation  RLE Motor Strength 3  LLE Motor Response Purposeful movement  LLE Sensation Full sensation  LLE Motor Strength 4  Glasgow Coma Scale  Eye Opening 4  Best Verbal Response (NON-intubated) 1  Best Motor Response 6  Glasgow Coma Scale Score 11  NIH Stroke Scale ( + Modified Stroke Scale Criteria)   Interval Shift assessment  Level of Consciousness (1a.)    0  LOC Questions (1b. )   + 2  LOC Commands (1c. )   +  0  Best Gaze (2. )  + 0  Visual (3. )  + 0  Facial Palsy (4. )     2  Motor Arm, Left (5a. )   + 0  Motor Arm, Right (5b. )   + 3  Motor Leg, Left (6a. )   + 0  Motor Leg, Right (6b. )   + 2  Limb Ataxia (7. ) 0  Sensory (8. )   + 1  Best Language (9. )   + 3  Dysarthria (10. ) UN  Extinction/Inattention (11.)   + 0  Modified SS Total  + 11  Complete NIHSS TOTAL 13  Neurological  Level of Consciousness Alert   First NIHSS since admission to unit, correlates with MD progress notes. Family reports that findings with neurological assessment has been patient's baseline since admission. Family at bedside and attentive to needs.

## 2021-10-07 NOTE — Progress Notes (Signed)
Inpatient Rehab Admissions:  Inpatient Rehab Consult received.  I spoke with patient's son [name] on the telephone for rehabilitation assessment and to discuss goals and expectations of an inpatient rehab admission.  He acknowledged understanding of CIR goals and expectations. He is interested in pt pursuing CIR. He confirmed that he and other family will provide support for pt after discharge. Will continue to follow.  Signed: Gayland Curry, [place], [place] Admissions Coordinator [phone]

## 2021-10-07 NOTE — Plan of Care (Signed)
  Problem: Education: Goal: Ability to describe self-care measures that may prevent or decrease complications (Diabetes Survival Skills Education) will improve Outcome: Progressing Goal: Individualized Educational Video(s) Outcome: Progressing   Problem: Coping: Goal: Ability to adjust to condition or change in health will improve Outcome: Progressing   Problem: Fluid Volume: Goal: Ability to maintain a balanced intake and output will improve Outcome: Progressing   Problem: Health Behavior/Discharge Planning: Goal: Ability to identify and utilize available resources and services will improve Outcome: Progressing Goal: Ability to manage health-related needs will improve Outcome: Progressing   Problem: Metabolic: Goal: Ability to maintain appropriate glucose levels will improve Outcome: Progressing   Problem: Nutritional: Goal: Maintenance of adequate nutrition will improve Outcome: Progressing Goal: Progress toward achieving an optimal weight will improve Outcome: Progressing   Problem: Skin Integrity: Goal: Risk for impaired skin integrity will decrease Outcome: Progressing   Problem: Tissue Perfusion: Goal: Adequacy of tissue perfusion will improve Outcome: Progressing   Problem: Education: Goal: Knowledge of General Education information will improve Description: Including pain rating scale, medication(s)/side effects and non-pharmacologic comfort measures Outcome: Progressing   Problem: Health Behavior/Discharge Planning: Goal: Ability to manage health-related needs will improve Outcome: Progressing   Problem: Clinical Measurements: Goal: Ability to maintain clinical measurements within normal limits will improve Outcome: Progressing Goal: Will remain free from infection Outcome: Progressing Goal: Diagnostic test results will improve Outcome: Progressing Goal: Respiratory complications will improve Outcome: Progressing Goal: Cardiovascular complication will  be avoided Outcome: Progressing   Problem: Activity: Goal: Risk for activity intolerance will decrease Outcome: Progressing   Problem: Nutrition: Goal: Adequate nutrition will be maintained Outcome: Progressing   Problem: Coping: Goal: Level of anxiety will decrease Outcome: Progressing   Problem: Elimination: Goal: Will not experience complications related to bowel motility Outcome: Progressing Goal: Will not experience complications related to urinary retention Outcome: Progressing   Problem: Pain Managment: Goal: General experience of comfort will improve Outcome: Progressing   Problem: Safety: Goal: Ability to remain free from injury will improve Outcome: Progressing   Problem: Skin Integrity: Goal: Risk for impaired skin integrity will decrease Outcome: Progressing   Problem: Education: Goal: Knowledge of disease or condition will improve Outcome: Progressing Goal: Knowledge of secondary prevention will improve (SELECT ALL) Outcome: Progressing Goal: Knowledge of patient specific risk factors will improve (INDIVIDUALIZE FOR PATIENT) Outcome: Progressing Goal: Individualized Educational Video(s) Outcome: Progressing   Problem: Coping: Goal: Will verbalize positive feelings about self Outcome: Progressing Goal: Will identify appropriate support needs Outcome: Progressing   Problem: Health Behavior/Discharge Planning: Goal: Ability to manage health-related needs will improve Outcome: Progressing   Problem: Self-Care: Goal: Ability to participate in self-care as condition permits will improve Outcome: Progressing Goal: Verbalization of feelings and concerns over difficulty with self-care will improve Outcome: Progressing Goal: Ability to communicate needs accurately will improve Outcome: Progressing   Problem: Nutrition: Goal: Risk of aspiration will decrease Outcome: Progressing Goal: Dietary intake will improve Outcome: Progressing   Problem:  Intracerebral Hemorrhage Tissue Perfusion: Goal: Complications of Intracerebral Hemorrhage will be minimized Outcome: Progressing   Problem: Ischemic Stroke/TIA Tissue Perfusion: Goal: Complications of ischemic stroke/TIA will be minimized Outcome: Progressing   Problem: Spontaneous Subarachnoid Hemorrhage Tissue Perfusion: Goal: Complications of Spontaneous Subarachnoid Hemorrhage will be minimized Outcome: Progressing

## 2021-10-07 NOTE — Progress Notes (Signed)
Speech Language Pathology Treatment: Dysphagia  Patient Details Name: Gerald Bauer MRN: [id number] DOB: 1945/11/04 Today's Date: 10/07/2021 Time: 5009-3818 SLP Time Calculation (min) (ACUTE ONLY): 40 min  Assessment / Plan / Recommendation Clinical Impression  Patient seen for skilled ST treatment targeting dysphagia. Patient sitting upright in chair just having finished with PT. SLP assisted with tray set-up; pt required cues and modeling initially but was able to pick up spoon with left hand and self-feed with occasional min support, improved from previous session when hand-over-hand assist was required. Patient indicated choices/preferences by pointing when offered items from F:2. Pt required verbal cues occasionally for impulsivity and smaller bolus size. Mod cues (verbal, tactile) necessary for awareness of right sided residue, anterior spillage (x2) with pureed solids, and for lingual sweep for oral clearance. Benefitted from bolus placement to left side. Pt exhibited cough x1 after sipping nectar liquid without clearing oral cavity. Subsequently no further s/sx aspiration when adhering to aspiration precautions. Cues necessary to attend to items on right side of tray. Pt consumed 4oz applesauce, 4 oz yogurt, and 8 oz nectar thick mild. RN administered meds in puree which pt tolerated without difficulty. Continue dysphagia 1, nectar liquids, meds whole in puree. Consider MBS for objective evaluation of swallow function and to determine ability to advance diet.     HPI HPI: Pt  is a 76 year old male with history of left-sided carotid stenosis, non-insulin-dependent diabetes mellitus type 2, PAD, GERD, mild COPD, history of tobacco use, hyperlipidemia, hypertension, who presents emergency department for chief concerns of facial droop, expressive aphasia, and R sided weakness.  MRI/MRA Head and Neck Imaging: MRI HEAD IMPRESSION:     1. Moderate sized acute ischemic left MCA distribution infarct as   above. No associated hemorrhage or significant regional mass effect.  2. Loss of normal flow voids throughout the left ICA and MCA,  consistent with slow flow and/or occlusion. Susceptibility artifact  within proximal left MCA branches consistent with intraluminal  thrombus.  3. Underlying mild chronic microvascular ischemic disease. Tiny  remote left ACA distribution infarct.     MRA HEAD IMPRESSION:   "1. Interval near complete occlusion of the left ICA and MCA,  presumably acute in nature given the presence of the acute left MCA  territory infarct. Left A1 segment not well seen either, also likely  occluded.  2. Otherwise wide patency of the major intracranial arterial  circulation. No other hemodynamically significant or correctable  stenosis.     MRA NECK IMPRESSION:  1. 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 Some attenuated flow seen  distally within the cervical left ICA, with subsequent reocclusion  by the skull base.  2. 50% atheromatous stenosis at the origin of the cervical right  ICA. Otherwise wide patency of the right carotid artery system.  3. Wide patency of the vertebral arteries within the neck.".      SLP Plan  Continue with current plan of care      Recommendations for follow up therapy are one component of a multi-disciplinary discharge planning process, led by the attending physician.  Recommendations may be updated based on patient status, additional functional criteria and insurance authorization.    Recommendations  Diet recommendations: Dysphagia 1 (puree);Nectar-thick liquid Liquids provided via: Cup Medication Administration: Whole meds with puree Supervision: Full supervision/cueing for compensatory strategies Compensations: Slow rate;Small sips/bites;Minimize environmental distractions;Lingual sweep for clearance of pocketing  Oral Care Recommendations: Oral care BID;Oral care before  and after PO;Staff/trained caregiver to provide oral care Follow Up Recommendations: Acute inpatient rehab (3hours/day) (CIR) Assistance recommended at discharge: Frequent or constant Supervision/Assistance SLP Visit Diagnosis: Dysphagia, oropharyngeal phase (R13.12);Aphasia (R47.01);Apraxia (R48.2) Plan: Continue with current plan of care        Gerald Bauer, [place], CCC-SLP Speech-Language Pathologist [phone]   Gerald Bauer  10/07/2021, 10:21 AM

## 2021-10-07 NOTE — Progress Notes (Signed)
   10/06/21 2000  Mobility  HOB Elevated/Bed Position HOB 30  Activity Turned to right side  Range of Motion/Exercises Passive;Right arm;Right leg  Level of Assistance Dependent, patient does less than 25%  Assistive Device None  Activity Response Tolerated well

## 2021-10-07 NOTE — Progress Notes (Addendum)
PROGRESS NOTE    Gerald Bauer  EHU:[id number] DOB: 04-11-1945 DOA: 10/05/2021 PCP: [name], PA-C    Brief Narrative:  76 year old gentleman with history of carotid stenosis, type 2 diabetes on oral hypoglycemics, peripheral artery disease, GERD, hyperlipidemia and hypertension presents to the ER with left-sided facial droop and unable to talk.  In the emergency room on room air.  Blood pressure stable.  Infection work-up negative.  CT head with hypoattenuation left frontal lobe and anterior left insula hypoattenuation.  Admitted with acute stroke. 7/15- appears MRD- plan for CIR if approved, awaiting dispo  Assessment & Plan:  Acute ischemic stroke: Clinical findings, expressive aphasia, right hemiparesis. CT head findings, hypoattenuation left frontal lobe and left insula. MRI of the brain, medium sized ischemic infarction left MCA territory. MRA of the head and neck, acute to subacute occlusion of the distal left ICA and MCA. 2D echocardiogram, pending. Antiplatelet therapy, on aspirin at home.  Neurology recommended to continue aspirin and Plavix indefinitely. LDL 44.  On Repatha.  On goal. Hemoglobin A1c, 7.1.  At goal.  Currently on sliding scale insulin.  On oral hypoglycemics that he can resume on discharge. PT/OT and neurology follow-up.  Speech swallow evaluation and cognition. Will benefit with acute inpatient rehab.  Type 2 diabetes, well controlled on metformin: Currently on SSI.  Hemoglobin A1c 7.1.  Resume metformin on discharge.  Essential hypertension: Blood pressures fairly stable.  Allow permissive hypertension.  Hyperlipidemia associated with type 2 diabetes: Intolerant to statin.  Continue Repatha.   DVT prophylaxis: enoxaparin (LOVENOX) injection 40 mg Start: 10/06/21 1000 SCD's Start: 10/05/21 1643   Code Status: Full code Family Communication: Son, daughter and daughter-in-law at the bedside Disposition Plan: Status is: Inpatient Remains inpatient  appropriate because: Acute a stroke.  Inpatient work-up.     Consultants:  Neurology  Procedures:  None  Antimicrobials:  None   Subjective: Still weak, difficulty speaking. Following commands, working with therapy.   Objective: Vitals:   10/06/21 1500 10/06/21 1953 10/07/21 0004 10/07/21 0514  BP: (!) 149/93 (!) 157/92 (!) 156/90 (!) 141/92  Pulse: 85 87 87 88  Resp: '16 18 18 16  '$ Temp: (!) 100.8 F (38.2 C) 99.3 F (37.4 C) 99.2 F (37.3 C) 100 F (37.8 C)  TempSrc:  Oral Oral   SpO2: 97% 98% 98% 96%  Weight:      Height:        Intake/Output Summary (Last 24 hours) at 10/07/2021 0840 Last data filed at 10/07/2021 0500 Gross per 24 hour  Intake --  Output 200 ml  Net -200 ml   Filed Weights   10/05/21 1405  Weight: 79.8 kg    Examination:  General exam: Appears calm and comfortable  Respiratory system: Clear to auscultation. Respiratory effort normal. Cardiovascular system: S1 & S2 heard, RRR. No JVD, murmurs, rubs, gallops or clicks. No pedal edema. Gastrointestinal system: Abdomen is nondistended, soft and nontender. No organomegaly or masses felt. Normal bowel sounds heard. Central nervous system: Alert and oriented.  Right ptosis Note Gerald Bauer has Prominent right facial droop Right upper extremity 0/5 proximal group, 2/5 distal group. Right lower extremity 2/5 proximal, 3/5 distal Left upper and lower extremity normal strength.   Data Reviewed: I have personally reviewed following labs and imaging studies  CBC: Recent Labs  Lab 10/05/21 1407  WBC 7.9  NEUTROABS 4.7  HGB 14.4  HCT 43.0  MCV 90.0  PLT 785    Basic Metabolic Panel: Recent Labs  Lab 10/05/21  1407  NA 137  K 3.8  CL 106  CO2 23  GLUCOSE 119*  BUN 12  CREATININE 0.90  CALCIUM 8.3*    GFR: Estimated Creatinine Clearance: 69.6 mL/min (by C-G formula based on SCr of 0.9 mg/dL). Liver Function Tests: Recent Labs  Lab 10/05/21 1407  AST 19  ALT 16  ALKPHOS 47   BILITOT 0.5  PROT 6.5  ALBUMIN 3.8    No results for input(s): "LIPASE", "AMYLASE" in the last 168 hours. No results for input(s): "AMMONIA" in the last 168 hours. Coagulation Profile: Recent Labs  Lab 10/05/21 1407  INR 1.1    Cardiac Enzymes: No results for input(s): "CKTOTAL", "CKMB", "CKMBINDEX", "TROPONINI" in the last 168 hours. BNP (last 3 results) No results for input(s): "PROBNP" in the last 8760 hours. HbA1C: Recent Labs    10/06/21 0510  HGBA1C 7.1*    CBG: Recent Labs  Lab 10/05/21 2105 10/06/21 0814 10/06/21 1206 10/06/21 1617 10/06/21 1955  GLUCAP 153* 169* 200* 126* 176*    Lipid Profile: Recent Labs    10/06/21 0510  CHOL 112  HDL 47  LDLCALC 44  TRIG 105  CHOLHDL 2.4    Thyroid Function Tests: No results for input(s): "TSH", "T4TOTAL", "FREET4", "T3FREE", "THYROIDAB" in the last 72 hours. Anemia Panel: No results for input(s): "VITAMINB12", "FOLATE", "FERRITIN", "TIBC", "IRON", "RETICCTPCT" in the last 72 hours. Sepsis Labs: No results for input(s): "PROCALCITON", "LATICACIDVEN" in the last 168 hours.  Recent Results (from the past 240 hour(s))  Resp Panel by RT-PCR (Flu A&B, Covid) Anterior Nasal Swab     Status: None   Collection Time: 10/05/21  2:07 PM   Specimen: Anterior Nasal Swab  Result Value Ref Range Status   SARS Coronavirus 2 by RT PCR NEGATIVE NEGATIVE Final    Comment: (NOTE) SARS-CoV-2 target nucleic acids are NOT DETECTED.  The SARS-CoV-2 RNA is generally detectable in upper respiratory specimens during the acute phase of infection. The lowest concentration of SARS-CoV-2 viral copies this assay can detect is 138 copies/mL. A negative result does not preclude SARS-Cov-2 infection and should not be used as the sole basis for treatment or other patient management decisions. A negative result may occur with  improper specimen collection/handling, submission of specimen other than nasopharyngeal swab, presence of  viral mutation(s) within the areas targeted by this assay, and inadequate number of viral copies(<138 copies/mL). A negative result must be combined with clinical observations, patient history, and epidemiological information. The expected result is Negative.  Fact Sheet for Patients:  EntrepreneurPulse.com.au  Fact Sheet for Healthcare Providers:  IncredibleEmployment.be  This test is no t yet approved or cleared by the [place] FDA and  has been authorized for detection and/or diagnosis of SARS-CoV-2 by FDA under an Emergency Use Authorization (EUA). This EUA will remain  in effect (meaning this test can be used) for the duration of the COVID-19 declaration under Section 564(b)(1) of the Act, 21 U.S.C.section 360bbb-3(b)(1), unless the authorization is terminated  or revoked sooner.       Influenza A by PCR NEGATIVE NEGATIVE Final   Influenza B by PCR NEGATIVE NEGATIVE Final    Comment: (NOTE) The Xpert Xpress SARS-CoV-2/FLU/RSV plus assay is intended as an aid in the diagnosis of influenza from Nasopharyngeal swab specimens and should not be used as a sole basis for treatment. Nasal washings and aspirates are unacceptable for Xpert Xpress SARS-CoV-2/FLU/RSV testing.  Fact Sheet for Patients: EntrepreneurPulse.com.au  Fact Sheet for Healthcare Providers: IncredibleEmployment.be  This  test is not yet approved or cleared by the [place] and has been authorized for detection and/or diagnosis of SARS-CoV-2 by FDA under an Emergency Use Authorization (EUA). This EUA will remain in effect (meaning this test can be used) for the duration of the COVID-19 declaration under Section 564(b)(1) of the Act, 21 U.S.C. section 360bbb-3(b)(1), unless the authorization is terminated or revoked.  Performed at [facility], [address], [place], [place] [address]          Radiology  Studies: ECHOCARDIOGRAM COMPLETE  Result Date: 10/06/2021    ECHOCARDIOGRAM REPORT   Patient Name:   CHANCELLOR VANDERLOOP [facility] Date of Exam: 10/06/2021 Medical Rec #:  [id number]        Height:       67.0 in Accession #:    [id number]       Weight:       176.0 lb Date of Birth:  1945/12/06         BSA:          1.915 m Patient Age:    14 years         BP:           150/105 mmHg Patient Gender: M                HR:           94 bpm. Exam Location:  [facility] Procedure: 2D Echo, Cardiac Doppler and Color Doppler Indications:     TIA G45.9                  Stroke I63.9  History:         Patient has no prior history of Echocardiogram examinations.  Sonographer:     Sherrie Sport Referring Phys:  [id number] AMY N COX Diagnosing Phys: Ida Rogue MD  Sonographer Comments: Suboptimal apical window. IMPRESSIONS  1. Left ventricular ejection fraction, by estimation, is 55 to 60%. The left ventricle has normal function. The left ventricle has no regional wall motion abnormalities. Left ventricular diastolic parameters are consistent with Grade I diastolic dysfunction (impaired relaxation).  2. Right ventricular systolic function is normal. The right ventricular size is normal. There is normal pulmonary artery systolic pressure. The estimated right ventricular systolic pressure is 46.2 mmHg.  3. The mitral valve is normal in structure. No evidence of mitral valve regurgitation. No evidence of mitral stenosis.  4. The aortic valve is normal in structure. Aortic valve regurgitation is not visualized. Aortic valve sclerosis is present, with no evidence of aortic valve stenosis.  5. The inferior vena cava is normal in size with greater than 50% respiratory variability, suggesting right atrial pressure of 3 mmHg. FINDINGS  Left Ventricle: Left ventricular ejection fraction, by estimation, is 55 to 60%. The left ventricle has normal function. The left ventricle has no regional wall motion abnormalities. The left ventricular internal cavity size was  normal in size. There is  no left ventricular hypertrophy. Left ventricular diastolic parameters are consistent with Grade I diastolic dysfunction (impaired relaxation). Right Ventricle: The right ventricular size is normal. No increase in right ventricular wall thickness. Right ventricular systolic function is normal. There is normal pulmonary artery systolic pressure. The tricuspid regurgitant velocity is 1.56 m/s, and  with an assumed right atrial pressure of 5 mmHg, the estimated right ventricular systolic pressure is 70.3 mmHg. Left Atrium: Left atrial size was normal in size. Right Atrium: Right atrial size was normal in size. Pericardium: There is  no evidence of pericardial effusion. Mitral Valve: The mitral valve is normal in structure. No evidence of mitral valve regurgitation. No evidence of mitral valve stenosis. Tricuspid Valve: The tricuspid valve is normal in structure. Tricuspid valve regurgitation is not demonstrated. No evidence of tricuspid stenosis. Aortic Valve: The aortic valve is normal in structure. Aortic valve regurgitation is not visualized. Aortic valve sclerosis is present, with no evidence of aortic valve stenosis. Aortic valve mean gradient measures 1.5 mmHg. Aortic valve peak gradient measures 1.9 mmHg. Aortic valve area, by VTI measures 4.26 cm. Pulmonic Valve: The pulmonic valve was normal in structure. Pulmonic valve regurgitation is not visualized. No evidence of pulmonic stenosis. Aorta: The aortic root is normal in size and structure. Venous: The inferior vena cava is normal in size with greater than 50% respiratory variability, suggesting right atrial pressure of 3 mmHg. IAS/Shunts: No atrial level shunt detected by color flow Doppler.  LEFT VENTRICLE PLAX 2D LVOT diam:     2.10 cm   Diastology LV SV:         44        LV e' medial:    9.46 cm/s LV SV Index:   23        LV E/e' medial:  5.8 LVOT Area:     3.46 cm  LV e' lateral:   9.68 cm/s                          LV E/e'  lateral: 5.7  RIGHT VENTRICLE RV S prime:     12.80 cm/s TAPSE (M-mode): 2.2 cm LEFT ATRIUM           Index LA diam:      2.90 cm 1.51 cm/m LA Vol (A4C): 38.4 ml 20.05 ml/m  AORTIC VALVE AV Area (Vmax):    3.32 cm AV Area (Vmean):   3.14 cm AV Area (VTI):     4.26 cm AV Vmax:           68.85 cm/s AV Vmean:          49.750 cm/s AV VTI:            0.102 m AV Peak Grad:      1.9 mmHg AV Mean Grad:      1.5 mmHg LVOT Vmax:         65.90 cm/s LVOT Vmean:        45.100 cm/s LVOT VTI:          0.126 m LVOT/AV VTI ratio: 1.23  AORTA Ao Root diam: 3.13 cm MITRAL VALVE               TRICUSPID VALVE MV Area (PHT): 4.15 cm    TR Peak grad:   9.7 mmHg MV Decel Time: 183 msec    TR Vmax:        156.00 cm/s MV E velocity: 55.30 cm/s MV A velocity: 87.80 cm/s  SHUNTS MV E/A ratio:  0.63        Systemic VTI:  0.13 m                            Systemic Diam: 2.10 cm Ida Rogue MD Electronically signed by Ida Rogue MD Signature Date/Time: 10/06/2021/5:23:05 PM    Final    MR BRAIN WO CONTRAST  Addendum Date: 10/06/2021   ADDENDUM REPORT: 10/06/2021 05:46 ADDENDUM: Case discussed with Dr. Sidney Ace by telephone  at 12:15 a.m. on 10/05/2021. Electronically Signed   By: Jeannine Boga M.D.   On: 10/06/2021 05:46   Result Date: 10/06/2021 CLINICAL DATA:  Initial evaluation for acute speech difficulty. EXAM: MRI HEAD WITHOUT CONTRAST MRA HEAD WITHOUT CONTRAST MRA NECK WITHOUT CONTRAST TECHNIQUE: Multiplanar, multiecho pulse sequences of the brain and surrounding structures were obtained without intravenous contrast. Angiographic images of the Circle of Willis were obtained using MRA technique without intravenous contrast. Angiographic images of the neck were obtained using MRA technique without intravenous contrast. Carotid stenosis measurements (when applicable) are obtained utilizing NASCET criteria, using the distal internal carotid diameter as the denominator. COMPARISON:  Prior ultrasound from 08/26/2013 and CT  from 10/05/2021. FINDINGS: MRI HEAD FINDINGS Brain: Generalized age-related cerebral atrophy. Scattered patchy and hazy T2/FLAIR hyperintensity involving the periventricular deep white matter both cerebral hemispheres, most consistent with chronic small vessel ischemic disease, mild in nature. Scattered patchy and confluent restricted diffusion involving the left frontal lobe, with involvement of the left basal ganglia and insula, consistent with acute left MCA distribution infarct. Mild patchy involvement of the anterior mesial left temporal lobe noted as well. No associated hemorrhage or significant regional mass effect. No other evidence for acute or subacute ischemia. Gray-white matter differentiation otherwise maintained. Tiny remote left ACA territory infarct noted at the mesial left frontal lobe. No other areas of chronic cortical infarction. No acute or chronic intracranial blood products. No mass lesion, midline shift or mass effect. No hydrocephalus or extra-axial fluid collection. Pituitary gland suprasellar region within normal limits. Vascular: Abnormal flow void throughout the visualized left ICA to the terminus, consistent with slow flow and/or occlusion. Susceptibility artifact within proximal left MCA branches at the left sylvian fissure, consistent with intraluminal thrombus (series 13, image 26). Remainder of the intracranial vascular flow voids are maintained. Skull and upper cervical spine: Craniocervical junction within normal limits. Bone marrow signal intensity normal. No scalp soft tissue abnormality. Sinuses/Orbits: Globes and orbital soft tissues within normal limits. Mild scattered mucosal thickening noted about the right maxillary sinus. Paranasal sinuses are otherwise clear. Trace left mastoid effusion noted, of doubtful significance. Other: None. MRA HEAD FINDINGS ANTERIOR CIRCULATION: Right ICA widely patent to the terminus without stenosis or other abnormality. Intermittent  attenuated flow seen within the visualized mid cervical left ICA and cervical petrous junction. Otherwise, the left ICA is largely occluded to the terminus. Right A1 segment patent. Normal anterior communicating artery complex. Both ACAs remain patent and well perfused. Left A1 not well seen, and could be occluded. Scant attenuated flow seen within the left M1 segment proximally. No significant flow seen within the left MCA branches distally. Findings presumably acute given the presence of the acute infarct. POSTERIOR CIRCULATION: Both vertebral arteries patent to the vertebrobasilar junction without stenosis. Left vertebral artery dominant. Both PICA patent. Basilar patent to its distal aspect without stenosis. Superior cerebellar arteries patent bilaterally. Both PCAs primarily supplied via the basilar well perfused or distal aspects. No visible aneurysm. MRA NECK FINDINGS AORTIC ARCH: Partially visualized aortic arch normal in caliber with standard 3 vessel morphology. No visible stenosis about the origin the great vessels. RIGHT CAROTID SYSTEM: Right CCA patent without stenosis. Atheromatous change about the right carotid bulb/proximal right ICA with associated stenosis of up to 50% by NASCET criteria. Right ICA patent distally without stenosis or evidence for dissection. LEFT CAROTID SYSTEM: Left CCA patent to the bifurcation. 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 1.3 cm flow gap  is present. Some attenuated flow seen distally within the cervical left ICA, which then largely reoccludes at the skull base. VERTEBRAL ARTERIES: Both vertebral arteries arise from the subclavian arteries. Neither vertebral artery origin well assessed on this exam. Visualized portions of the vertebral arteries patent without stenosis or evidence for dissection. IMPRESSION: MRI HEAD IMPRESSION: 1. 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 No associated hemorrhage or  significant regional mass effect. 2. Loss of normal flow voids throughout the left ICA and MCA, consistent with slow flow and/or occlusion. 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 3. Underlying mild chronic microvascular ischemic disease. Tiny remote left ACA distribution infarct. MRA HEAD IMPRESSION: 1. 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 Left A1 segment not well seen either, also likely occluded. 2. Otherwise wide patency of the major intracranial arterial circulation. No other hemodynamically significant or correctable stenosis. MRA NECK IMPRESSION: 1. 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 Some attenuated flow seen distally within the cervical left ICA, with subsequent reocclusion by the skull base. 2. 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 Otherwise wide patency of the right carotid artery system. 3. Wide patency of the vertebral arteries within the neck. The clinician taking care of this patient has been paged regarding these results, currently awaiting a call back. Results will be conveyed as soon as possible. Electronically Signed: By: Jeannine Boga M.D. On: 10/06/2021 00:07   MR ANGIO HEAD WO CONTRAST  Addendum Date: 10/06/2021   ADDENDUM REPORT: 10/06/2021 05:46 ADDENDUM: Case discussed with Dr. Sidney Ace by telephone at 12:15 a.m. on 10/05/2021. Electronically Signed   By: Jeannine Boga M.D.   On: 10/06/2021 05:46   Result Date: 10/06/2021 CLINICAL DATA:  Initial evaluation for acute speech difficulty. EXAM: MRI HEAD WITHOUT CONTRAST MRA HEAD WITHOUT CONTRAST MRA NECK WITHOUT CONTRAST TECHNIQUE: Multiplanar, multiecho pulse sequences of the brain and surrounding structures were obtained without intravenous contrast. Angiographic images  of the Circle of Willis were obtained using MRA technique without intravenous contrast. Angiographic images of the neck were obtained using MRA technique without intravenous contrast. Carotid stenosis measurements (when applicable) are obtained utilizing NASCET criteria, using the distal internal carotid diameter as the denominator. COMPARISON:  Prior ultrasound from 08/26/2013 and CT from 10/05/2021. FINDINGS: MRI HEAD FINDINGS Brain: Generalized age-related cerebral atrophy. Scattered patchy and hazy T2/FLAIR hyperintensity involving the periventricular deep white matter both cerebral hemispheres, most consistent with chronic small vessel ischemic disease, mild in nature. Scattered patchy and confluent restricted diffusion involving the left frontal lobe, with involvement of the left basal ganglia and insula, consistent with acute left MCA distribution infarct. Mild patchy involvement of the anterior mesial left temporal lobe noted as well. No associated hemorrhage or significant regional mass effect. No other evidence for acute or subacute ischemia. Gray-white matter differentiation otherwise maintained. Tiny remote left ACA territory infarct noted at the mesial left frontal lobe. No other areas of chronic cortical infarction. No acute or chronic intracranial blood products. No mass lesion, midline shift or mass effect. No hydrocephalus or extra-axial fluid collection. Pituitary gland suprasellar region within normal limits. Vascular: Abnormal flow void throughout the visualized left ICA to the terminus, consistent with slow flow and/or occlusion. Susceptibility artifact within proximal left MCA branches at the left sylvian fissure, consistent with  intraluminal thrombus (series 13, image 26). Remainder of the intracranial vascular flow voids are maintained. Skull and upper cervical spine: Craniocervical junction within normal limits. Bone marrow signal intensity normal. No scalp soft tissue abnormality.  Sinuses/Orbits: Globes and orbital soft tissues within normal limits. Mild scattered mucosal thickening noted about the right maxillary sinus. Paranasal sinuses are otherwise clear. Trace left mastoid effusion noted, of doubtful significance. Other: None. MRA HEAD FINDINGS ANTERIOR CIRCULATION: Right ICA widely patent to the terminus without stenosis or other abnormality. Intermittent attenuated flow seen within the visualized mid cervical left ICA and cervical petrous junction. Otherwise, the left ICA is largely occluded to the terminus. Right A1 segment patent. Normal anterior communicating artery complex. Both ACAs remain patent and well perfused. Left A1 not well seen, and could be occluded. Scant attenuated flow seen within the left M1 segment proximally. No significant flow seen within the left MCA branches distally. Findings presumably acute given the presence of the acute infarct. POSTERIOR CIRCULATION: Both vertebral arteries patent to the vertebrobasilar junction without stenosis. Left vertebral artery dominant. Both PICA patent. Basilar patent to its distal aspect without stenosis. Superior cerebellar arteries patent bilaterally. Both PCAs primarily supplied via the basilar well perfused or distal aspects. No visible aneurysm. MRA NECK FINDINGS AORTIC ARCH: Partially visualized aortic arch normal in caliber with standard 3 vessel morphology. No visible stenosis about the origin the great vessels. RIGHT CAROTID SYSTEM: Right CCA patent without stenosis. Atheromatous change about the right carotid bulb/proximal right ICA with associated stenosis of up to 50% by NASCET criteria. Right ICA patent distally without stenosis or evidence for dissection. LEFT CAROTID SYSTEM: Left CCA patent to the bifurcation. 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 1.3 cm flow gap is present. Some attenuated flow seen distally within the cervical left ICA, which then largely reoccludes at  the skull base. VERTEBRAL ARTERIES: Both vertebral arteries arise from the subclavian arteries. Neither vertebral artery origin well assessed on this exam. Visualized portions of the vertebral arteries patent without stenosis or evidence for dissection. IMPRESSION: MRI HEAD IMPRESSION: 1. 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 No associated hemorrhage or significant regional mass effect. 2. Loss of normal flow voids throughout the left ICA and MCA, consistent with slow flow and/or occlusion. 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 3. Underlying mild chronic microvascular ischemic disease. Tiny remote left ACA distribution infarct. MRA HEAD IMPRESSION: 1. 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 Left A1 segment not well seen either, also likely occluded. 2. Otherwise wide patency of the major intracranial arterial circulation. No other hemodynamically significant or correctable stenosis. MRA NECK IMPRESSION: 1. 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 Some attenuated flow seen distally within the cervical left ICA, with subsequent reocclusion by the skull base. 2. 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 Otherwise wide patency of the right carotid artery system. 3. Wide patency of the vertebral arteries within the neck. The clinician taking care of this patient has been paged regarding these results, currently awaiting a call back. Results will be conveyed as soon as possible. Electronically Signed: By: Jeannine Boga M.D. On: 10/06/2021 00:07   MR ANGIO NECK W WO CONTRAST  Addendum Date: 10/06/2021   ADDENDUM REPORT: 10/06/2021  05:46 ADDENDUM: Case discussed with Dr. Sidney Ace by telephone at 12:15 a.m. on 10/05/2021. Electronically  Signed   By: Jeannine Boga M.D.   On: 10/06/2021 05:46   Result Date: 10/06/2021 CLINICAL DATA:  Initial evaluation for acute speech difficulty. EXAM: MRI HEAD WITHOUT CONTRAST MRA HEAD WITHOUT CONTRAST MRA NECK WITHOUT CONTRAST TECHNIQUE: Multiplanar, multiecho pulse sequences of the brain and surrounding structures were obtained without intravenous contrast. Angiographic images of the Circle of Willis were obtained using MRA technique without intravenous contrast. Angiographic images of the neck were obtained using MRA technique without intravenous contrast. Carotid stenosis measurements (when applicable) are obtained utilizing NASCET criteria, using the distal internal carotid diameter as the denominator. COMPARISON:  Prior ultrasound from 08/26/2013 and CT from 10/05/2021. FINDINGS: MRI HEAD FINDINGS Brain: Generalized age-related cerebral atrophy. Scattered patchy and hazy T2/FLAIR hyperintensity involving the periventricular deep white matter both cerebral hemispheres, most consistent with chronic small vessel ischemic disease, mild in nature. Scattered patchy and confluent restricted diffusion involving the left frontal lobe, with involvement of the left basal ganglia and insula, consistent with acute left MCA distribution infarct. Mild patchy involvement of the anterior mesial left temporal lobe noted as well. No associated hemorrhage or significant regional mass effect. No other evidence for acute or subacute ischemia. Gray-white matter differentiation otherwise maintained. Tiny remote left ACA territory infarct noted at the mesial left frontal lobe. No other areas of chronic cortical infarction. No acute or chronic intracranial blood products. No mass lesion, midline shift or mass effect. No hydrocephalus or extra-axial fluid collection. Pituitary gland suprasellar region within normal limits. Vascular: Abnormal flow void throughout the visualized left ICA to the terminus, consistent with slow  flow and/or occlusion. Susceptibility artifact within proximal left MCA branches at the left sylvian fissure, consistent with intraluminal thrombus (series 13, image 26). Remainder of the intracranial vascular flow voids are maintained. Skull and upper cervical spine: Craniocervical junction within normal limits. Bone marrow signal intensity normal. No scalp soft tissue abnormality. Sinuses/Orbits: Globes and orbital soft tissues within normal limits. Mild scattered mucosal thickening noted about the right maxillary sinus. Paranasal sinuses are otherwise clear. Trace left mastoid effusion noted, of doubtful significance. Other: None. MRA HEAD FINDINGS ANTERIOR CIRCULATION: Right ICA widely patent to the terminus without stenosis or other abnormality. Intermittent attenuated flow seen within the visualized mid cervical left ICA and cervical petrous junction. Otherwise, the left ICA is largely occluded to the terminus. Right A1 segment patent. Normal anterior communicating artery complex. Both ACAs remain patent and well perfused. Left A1 not well seen, and could be occluded. Scant attenuated flow seen within the left M1 segment proximally. No significant flow seen within the left MCA branches distally. Findings presumably acute given the presence of the acute infarct. POSTERIOR CIRCULATION: Both vertebral arteries patent to the vertebrobasilar junction without stenosis. Left vertebral artery dominant. Both PICA patent. Basilar patent to its distal aspect without stenosis. Superior cerebellar arteries patent bilaterally. Both PCAs primarily supplied via the basilar well perfused or distal aspects. No visible aneurysm. MRA NECK FINDINGS AORTIC ARCH: Partially visualized aortic arch normal in caliber with standard 3 vessel morphology. No visible stenosis about the origin the great vessels. RIGHT CAROTID SYSTEM: Right CCA patent without stenosis. Atheromatous change about the right carotid bulb/proximal right ICA with  associated stenosis of up to 50% by NASCET criteria. Right ICA patent distally without stenosis or evidence for dissection. LEFT CAROTID SYSTEM: Left CCA patent to the bifurcation. There is severe near occlusive stenosis at the  origin of the cervical left ICA. 1.3 cm flow gap is present. Some attenuated flow seen distally within the cervical left ICA, which then largely reoccludes at the skull base. VERTEBRAL ARTERIES: Both vertebral arteries arise from the subclavian arteries. Neither vertebral artery origin well assessed on this exam. Visualized portions of the vertebral arteries patent without stenosis or evidence for dissection. IMPRESSION: MRI HEAD IMPRESSION: 1. 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 No associated hemorrhage or significant regional mass effect. 2. Loss of normal flow voids throughout the left ICA and MCA, consistent with slow flow and/or occlusion. 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 3. Underlying mild chronic microvascular ischemic disease. Tiny remote left ACA distribution infarct. MRA HEAD IMPRESSION: 1. 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 Left A1 segment not well seen either, also likely occluded. 2. Otherwise wide patency of the major intracranial arterial circulation. No other hemodynamically significant or correctable stenosis. MRA NECK IMPRESSION: 1. 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 Some attenuated flow seen distally within the cervical left ICA, with subsequent reocclusion by the skull base. 2. 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 Otherwise wide patency of the right carotid artery system. 3. Wide patency of the vertebral arteries within the neck. The clinician taking care of this patient  has been paged regarding these results, currently awaiting a call back. Results will be conveyed as soon as possible. Electronically Signed: By: Jeannine Boga M.D. On: 10/06/2021 00:07   DG Chest Port 1 View  Result Date: 10/05/2021 CLINICAL DATA:  Stroke, TIA EXAM: PORTABLE CHEST 1 VIEW COMPARISON:  07/11/2017 FINDINGS: Heart and mediastinal contours are within normal limits. No focal opacities or effusions. No acute bony abnormality. IMPRESSION: No active disease. Electronically Signed   By: Rolm Baptise M.D.   On: 10/05/2021 17:21   CT HEAD WO CONTRAST  Result Date: 10/05/2021 CLINICAL DATA:  Mental status change, unknown cause EXAM: CT HEAD WITHOUT CONTRAST TECHNIQUE: Contiguous axial images were obtained from the base of the skull through the vertex without intravenous contrast. RADIATION DOSE REDUCTION: This exam was performed according to the departmental dose-optimization program which includes automated exposure control, adjustment of the mA and/or kV according to patient size and/or use of iterative reconstruction technique. COMPARISON:  None Available. FINDINGS: Brain: Mild hypoattenuation in the region of the inferior left frontal lobe and anterior left insula. No acute hemorrhage, mass lesion, midline shift, or hydrocephalus. No visible extra-axial fluid collection. Vascular: No hyperdense vessel identified. Calcific intracranial atherosclerosis. Skull: No acute fracture. Sinuses/Orbits: Clear visualized sinuses. No acute orbital findings. Other: No mastoid effusions. IMPRESSION: Mild hypoattenuation in the region of the inferior left frontal lobe and anterior left insula. This may be artifactual given suspected streak artifact in this region; however, if there is concern for left MCA territory infarct then recommend MRI for more sensitive evaluation. Electronically Signed   By: Margaretha Sheffield M.D.   On: 10/05/2021 15:20        Scheduled Meds:   stroke: early stages of recovery  book   Does not apply Once   aspirin EC  81 mg Oral Daily   cholecalciferol  1,000 Units Oral Daily   clopidogrel  75 mg Oral Daily   enoxaparin (LOVENOX) injection  40 mg Subcutaneous Q24H  insulin aspart  0-15 Units Subcutaneous TID WC   insulin aspart  0-5 Units Subcutaneous QHS   vitamin B-12  500 mcg Oral Daily   Continuous Infusions:   LOS: 1 day    Time spent: 35 minutes    Vanna Scotland, MD [facility] Pager [phone]

## 2021-10-07 NOTE — Evaluation (Signed)
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/2021   Aphasia 10/05/2021   Myalgia due to statin 02/21/2021   Type 2 diabetes mellitus without complication, without long-term current use of insulin ([place]) 02/21/2021   Aortic atherosclerosis ([place]) 04/27/2020   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   Left foot pain 01/10/2017   Tinea pedis of both feet 01/10/2017   Onychomycosis 01/10/2017   Needs flu shot 01/10/2017   Onychomycosis of multiple toenails with type 2 diabetes mellitus ([place]) 07/19/2016   SOB (shortness of breath) 07/14/2016   Carotid stenosis 03/13/2016   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 07/11/2015   Medication monitoring encounter 05/04/2015   Ganglion cyst 02/23/2015   Joint pain in fingers of right hand 02/23/2015   Hyperlipidemia associated with type 2 diabetes mellitus ([place])    Hypertension associated with type 2 diabetes mellitus ([place])    Personal history of prostate cancer    Carotid atherosclerosis    Fatigue 08/20/2013   GERD (gastroesophageal reflux disease) 08/20/2013   Past Medical History:  Past Medical History:  Diagnosis Date   Acid reflux    Arthritis    hands   Carotid atherosclerosis    Diabetes mellitus without complication (HCC)    Hyperlipemia    Hypertension    Prostate cancer ([place])    history   Tachycardia    Past Surgical History:  Past Surgical History:  Procedure Laterality Date   APPENDECTOMY  1973   CHOLECYSTECTOMY  2013   PROSTATECTOMY  2012   HPI:  Pt  is a 76 year old male with history of left-sided carotid stenosis, non-insulin-dependent diabetes mellitus type 2, PAD, GERD, mild COPD, history of tobacco use, hyperlipidemia, hypertension, who presents emergency department  for chief concerns of facial droop, expressive aphasia, and R sided weakness.  MRI/MRA Head and Neck Imaging: MRI HEAD IMPRESSION:     1. 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 No associated hemorrhage or significant regional mass effect.  2. Loss of normal flow voids throughout the left ICA and MCA,  consistent with slow flow and/or occlusion. Susceptibility artifact  within proximal left MCA branches consistent with intraluminal  thrombus.  3. Underlying mild chronic microvascular ischemic disease. Tiny  remote left ACA distribution infarct.     MRA HEAD IMPRESSION:   "1. Interval near complete occlusion of the left ICA and MCA,  presumably acute in nature given the presence of the acute left MCA  territory infarct. Left A1 segment not well seen either, also likely  occluded.  2. Otherwise wide patency of the major intracranial arterial  circulation. No other hemodynamically significant or correctable  stenosis.     MRA NECK IMPRESSION:  1. 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 Some attenuated flow seen  distally within the cervical left ICA, with subsequent reocclusion  by the skull base.  2. 50% atheromatous stenosis at the origin of the cervical right  ICA. Otherwise wide patency of the right carotid artery system.  3. Wide patency of the vertebral arteries within the neck.".   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 Pt does not generate verbal output, although does initiate min voicing with yes/no questions (paired with head nod/shake). He makes efforts to imitate sounds/automatic speech tasks and achieves minimal movement when imitating bilabial sounds. Auditory comprehension is a relative strength, with pt able to provide accurate responses to biographical and contextual  Y/N questions and follow simple, at times multi-step commands in context. Complex Y/N and multi-step commands out of context are impaired. Right neglect may play a role in errors as pt did not always attend to objects to his right. 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 Pt exhibited moderate ideomotor apraxia when cued to use common objects from [facility] box. Pt is right-hand dominant. He attempted to copy his name and numbers 1-3 using his left hand but produced only lines and squiggles on the page. Reading comprehension is impaired at word level; he chose correct word from F:4 to match picture 25% accuracy, and correct word to match object from written choice of 3 words 60% accuracy. Patient appeared to have good frustration tolerance and was responsive to cues from therapist. I recommend ongoing ST services to improve communication abilities, as well as continued dysphagia treatment during acute stay; pt would benefit from intensive ST services. Continue to recommend CIR; patient appears to have good family support.     SLP Assessment  SLP Recommendation/Assessment: Patient needs continued Speech Lanaguage Pathology Services SLP Visit Diagnosis: Aphasia (R47.01);Apraxia (R48.2)    Recommendations for follow up therapy are one component of a multi-disciplinary discharge planning process, led by the attending physician.  Recommendations may be updated based on patient status, additional functional criteria and insurance authorization.    Follow Up Recommendations  Acute inpatient rehab (3hours/day) ([facility])    Assistance Recommended at Discharge  Frequent or constant Supervision/Assistance  Functional Status Assessment Patient has had a recent decline in their functional status and demonstrates the ability to make significant improvements in function in a reasonable and predictable amount of time.  Frequency and Duration min 3x week  2 weeks       SLP Evaluation Cognition  Overall Cognitive Status: Within Functional Limits for tasks assessed Arousal/Alertness: Awake/alert Orientation Level: Other (comment) (difficult to assess given severity of language impairment)       Comprehension  Auditory Comprehension Overall Auditory Comprehension: Impaired Yes/No Questions: Impaired (90%) Basic Biographical Questions: 76-100% accurate Basic Immediate Environment Questions: 75-100% accurate Complex Questions: 50-74% accurate Commands: Impaired One Step Basic Commands: 75-100% accurate Two Step Basic Commands: 50-74% accurate Multistep Basic Commands: 50-74% accurate Conversation: Simple EffectiveTechniques: Extra processing time;Repetition;Visual/Gestural cues Visual Recognition/Discrimination Discrimination: Within Function Limits Common Objects: Able in field of 3 Black/White Line Drawings: Other (comment) (able in field of 4) Reading Comprehension Reading Status: Impaired Word level: Impaired (picture to written word F:4 25% acc, common object to written word F:3 75% acc) Sentence Level: Not tested Paragraph Level: Impaired Functional Environmental (signs, name badge): Not tested Interfering Components: Right neglect/inattention;Processing time Effective Techniques: Verbal cueing;Visual cueing    Expression Expression Primary Mode of Expression: Verbal Verbal Expression Overall Verbal Expression: Impaired Initiation: Impaired Automatic Speech:  (attempted automatic speech, singing, name with minimal output (min voicing, slight lip movement), though pt appears to make effort. 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 in addition to aphasia) Repetition: Impaired Level of Impairment: Word level Naming: Impairment Responsive: 0-25% accurate Confrontation: Impaired Common Objects: Able in field of 3 Black/White Line Drawings: Other (comment) (able in field of 4) Non-Verbal Means of Communication: Gestures;Eye  gaze Written  Expression Dominant Hand: Right Written Expression: Exceptions to [facility]   Oral / Motor  Oral Motor/Sensory Function Overall Oral Motor/Sensory Function: Moderate impairment Facial ROM: Reduced right;Suspected CN VII (facial) dysfunction (mild) Facial Symmetry: Abnormal symmetry right;Suspected CN VII (facial) dysfunction (mild) Facial Strength: Reduced right;Suspected CN VII (facial) dysfunction Facial Sensation: Reduced right;Suspected CN V (Trigeminal) dysfunction Lingual ROM: Reduced right;Suspected CN XII (hypoglossal) dysfunction Lingual Symmetry: Abnormal symmetry right;Suspected CN XII (hypoglossal) dysfunction Lingual Strength: Reduced;Suspected CN XII (hypoglossal) dysfunction Lingual Sensation: Reduced;Suspected CN VII (facial) dysfunction-anterior 2/3 tongue Motor Speech Overall Motor Speech: Impaired Respiration: Within functional limits Phonation: Other (comment) (difficult to initiate) Articulation: Impaired Level of Impairment:  (sound) Intelligibility: Unable to assess (comment) (no verbal output) Motor Planning: Impaired Level of Impairment:  (sound) Effective Techniques:  (attempted imitation, tactile cues. pt able to approximate some bilabial sound placement minimally)           Gerald Lever, MS, CCC-SLP Speech-Language Pathologist [phone]  Gerald Bauer 10/07/2021, 11:40 AM

## 2021-10-07 NOTE — Progress Notes (Signed)
  Chaplain On-Call responded to [place] Order from Avon Gully, RN, for Advance Directives information for the patient.  Chaplain met patient, daughter and granddaughter. The family stated that the patient has had an acute stroke, and is non-verbal.  The family also had questions about the Advance Directives documents. Chaplain provided the documents and education to the patient and family. Chaplain also explained the procedure for completion of the AD documents if the patient chooses.  Chaplain Pollyann Samples M.Div., [facility]

## 2021-10-07 NOTE — PMR Pre-admission (Signed)
PMR Admission Coordinator Pre-Admission Assessment  Patient: Gerald Bauer is an 76 y.o., male MRN: [id number] DOB: May 03, 1945 Height: '5\' 7"'$  (170.2 cm) Weight: 79.8 kg  Insurance Information HMO: ***    PPO: ***     PCP:      IPA:      80/20:      OTHER:  PRIMARY: [employer] Medicare      Policy#: [id number]      Subscriber: patient CM Name: ***      Phone#: ***     Fax#: *** Pre-Cert#: ***      Employer: *** Benefits:  Phone #: ***     Name: *** Irene Shipper. Date: ***     Deduct: ***      Out of Pocket Max: ***      Life Max: *** CIR: ***      SNF: *** Outpatient: ***     Co-Pay: *** Home Health: ***      Co-Pay: *** DME: ***     Co-Pay: *** Providers: in-network SECONDARY:       Policy#:      Phone#:   Financial Counselor:       Phone#:   The Engineer, [employer]" for patients in Inpatient Rehabilitation Facilities with attached "Privacy Act [place] Records" was provided and verbally reviewed with: {CHL IP Patient Family UX:[id number]}  Emergency Contact Information Contact Information     Name Relation Home Work [place] Son   [phone]       Current Medical History  Patient Admitting Diagnosis: CVA History of Present Illness: Pt is a 76 year old male with medical hx significant for: carotid stenosis, mild COPD, GERD, PAD, NIDDM II, h/o tobacco use, hyperlipidemia, HTN. Pt presented to [facility] on 10/05/21 d/t speech difficulties and left facial droop. CT head showed mild hypoattenuation in inferior left frontal lobe and anterior left insula. 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 TEE showed EF 55-60%. Therapy evaluations completed and CIR recommended d/t pt's deficits in functional mobility, ability to complete ADLs independently, dysphagia, and aphasia. Complete NIHSS TOTAL:  13  Patient's medical record from [facility] has been reviewed by the rehabilitation admission coordinator and physician.  Past Medical History  Past Medical History:  Diagnosis Date   Acid reflux    Arthritis    hands   Carotid atherosclerosis    Diabetes mellitus without complication (HCC)    Hyperlipemia    Hypertension    Prostate cancer ([place])    history   Tachycardia     Has the patient had major surgery during 100 days prior to admission? No  Family History   family history includes Aneurysm in his paternal grandfather; Arthritis in his maternal grandmother and mother; COPD in his maternal uncle; Cancer in his brother, maternal grandmother, and maternal uncle; Dementia in his mother; Diabetes in his paternal grandmother; Heart attack in his father; Heart disease in his father; High Cholesterol in his mother; Hyperlipidemia in his mother; Hypertension in his mother; Prostate cancer in his brother; Stroke in his paternal grandmother.  Current Medications  Current Facility-Administered Medications:     stroke: early stages of recovery book, , Does not apply, Once, Cox, Amy N, DO   acetaminophen (TYLENOL) tablet 650 mg, 650 mg, Oral, Q4H PRN, 650 mg at 10/05/21 1946 **OR** acetaminophen (  TYLENOL) 160 MG/5ML solution 650 mg, 650 mg, Per Tube, Q4H PRN **OR** acetaminophen (TYLENOL) suppository 650 mg, 650 mg, Rectal, Q4H PRN, Cox, Amy N, DO   albuterol (PROVENTIL) (2.5 MG/3ML) 0.083% nebulizer solution 2.5 mg, 2.5 mg, Inhalation, Q6H PRN, Cox, Amy N, DO   amLODipine (NORVASC) tablet 5 mg, 5 mg, Oral, Daily, Alanda Amass, Aseem, MD, 5 mg at 10/07/21 0953   aspirin EC tablet 81 mg, 81 mg, Oral, Daily, Cox, Amy N, DO, 81 mg at 10/07/21 3662   cholecalciferol (VITAMIN D3) tablet 1,000 Units, 1,000 Units, Oral, Daily, Cox, Amy N, DO, 1,000 Units at 10/07/21 9476   clopidogrel (PLAVIX) tablet 75 mg, 75 mg, Oral, Daily, Ghimire, Dante Gang, MD, 75 mg at 10/07/21 0953   enoxaparin  (LOVENOX) injection 40 mg, 40 mg, Subcutaneous, Q24H, Cox, Amy N, DO, 40 mg at 10/07/21 5465   insulin aspart (novoLOG) injection 0-15 Units, 0-15 Units, Subcutaneous, TID WC, Cox, Amy N, DO, 5 Units at 10/07/21 1150   insulin aspart (novoLOG) injection 0-5 Units, 0-5 Units, Subcutaneous, QHS, Cox, Amy N, DO   labetalol (NORMODYNE) injection 5 mg, 5 mg, Intravenous, Q3H PRN, Cox, Amy N, DO   senna-docusate (Senokot-S) tablet 1 tablet, 1 tablet, Oral, QHS PRN, Cox, Amy N, DO   vitamin B-12 (CYANOCOBALAMIN) tablet 500 mcg, 500 mcg, Oral, Daily, Cox, Amy N, DO, 500 mcg at 10/07/21 0354  Patients Current Diet:  Diet Order             DIET - DYS 1 Room service appropriate? Yes with Assist; Fluid consistency: Nectar Thick  Diet effective now                   Precautions / Restrictions Precautions Precautions: Fall Restrictions Weight Bearing Restrictions: No   Has the patient had 2 or more falls or a fall with injury in the past year? No  Prior Activity Level Community (5-7x/wk): drives, leaves house when feels like it  Prior Functional Level Self Care: Did the patient need help bathing, dressing, using the toilet or eating? Independent  Indoor Mobility: Did the patient need assistance with walking from room to room (with or without device)? Independent  Stairs: Did the patient need assistance with internal or external stairs (with or without device)? Independent  Functional Cognition: Did the patient need help planning regular tasks such as shopping or remembering to take medications? Needed some help  Patient Information Are you of Hispanic, Latino/a,or Spanish origin?: X. Patient unable to respond, A. No, not of Hispanic, Latino/a, or Spanish origin What is your race?: X. Patient unable to respond, A. White Do you need or want an interpreter to communicate with a doctor or health care staff?: 9. Unable to respond  Patient's Response To:  Health Literacy and  Transportation Is the patient able to respond to health literacy and transportation needs?: No Health Literacy - How often do you need to have someone help you when you read instructions, pamphlets, or other written material from your doctor or pharmacy?: Patient unable to respond In the past 12 months, has lack of transportation kept you from medical appointments or from getting medications?:  (pt not able to respond) In the past 12 months, has lack of transportation kept you from meetings, work, or from getting things needed for daily living?:  (pt not able to respond)  Home Assistive Devices / [place] Devices/Equipment: None Home Equipment: None  Prior Device Use: Indicate devices/aids used by the patient prior to  current illness, exacerbation or injury? None of the above  Current Functional Level Cognition  Arousal/Alertness: Awake/alert Overall Cognitive Status: Within Functional Limits for tasks assessed Orientation Level: Other (comment) (difficult to assess given severity of language impairment) General Comments: Pt displaying expressive difficulties but head nods to yes/no questions. Family present to confirm baseline. Pt follows commands with increased time.    Extremity Assessment (includes Sensation/Coordination)  Upper Extremity Assessment: RUE deficits/detail RUE Deficits / Details: 3-/5 limited AROM against gravity and PROM WFLs  Lower Extremity Assessment: Defer to PT evaluation RLE Deficits / Details: RLE MMT 3/5 hip flex 2/5; LLE WFL. Sensation WNL bilaterally    ADLs  Overall ADL's : Needs assistance/impaired Grooming Details (indicate cue type and reason): hand over hand assistance to bring R hand to face. Toilet Transfer: Moderate assistance, Maximal assistance Toilet Transfer Details (indicate cue type and reason): simulated transfer    Mobility  Overal bed mobility: Needs Assistance Bed Mobility: Supine to Sit, Rolling, Sidelying to  Sit Rolling: Min guard Sidelying to sit: Min assist, Mod assist Supine to sit: Min assist, Mod assist Sit to supine: Mod assist General bed mobility comments: pt was able to roll R to short sit with increased time and vcs for technique and sequencing    Transfers  Overall transfer level: Needs assistance Equipment used: Hemi-walker Transfers: Sit to/from Stand Sit to Stand: Min assist Bed to/from chair/wheelchair/BSC transfer type:: Step pivot Step pivot transfers: Mod assist, Max assist General transfer comment: pt stood to hemiwalker with min assist. Pryor Curia has to assist RUE and remains HHA throughout gait training. elected not to use platform walker to allow author better access to RLE for advancement    Ambulation / Gait / Stairs / Wheelchair Mobility  Ambulation/Gait Ambulation/Gait assistance: Mod assist, Max assist Gait Distance (Feet): 12 Feet Assistive device: Hemi-walker Gait Pattern/deviations: Step-to pattern, Decreased step length - right, Decreased step length - left General Gait Details: Pt was able to ambulate ~ 12 ft in room with making one turn. Does fatigue quickly but puts forth great effort throughout. Pt is able to advance hemiwalker and LLE however required mod-max to assist/progress RLE to taking steps. Author assist pt with L lateral wt shift to allow RLE advancement. Gait velocity: decreased    Posture / Balance Dynamic Sitting Balance Sitting balance - Comments: occasional posterior LOB however mostly required CGA-close supervision Balance Overall balance assessment: Needs assistance Sitting-balance support: Single extremity supported, Feet supported Sitting balance-Leahy Scale: Fair Sitting balance - Comments: occasional posterior LOB however mostly required CGA-close supervision Standing balance support: Single extremity supported, During functional activity, Reliant on assistive device for balance Standing balance-Leahy Scale: Poor Standing balance  comment: Pt is high fall risk even with use of AD. elected not to use platform walker for easier access to RLE for advancement    Special needs/care consideration Diabetic management novoLOG 0-5 units daily at bedtime; novoLOG 0-15 units 3x daily at meals. Bladder incontinence, External urinary catheter   Previous Home Environment (from acute therapy documentation) Living Arrangements: Alone  Lives With: Alone Available Help at Discharge: Family, Available 24 hours/day Type of Home: House Home Layout: One level Home Access: Stairs to enter Entrance Stairs-Rails: Can reach both Entrance Stairs-Number of Steps: 5-6 Bathroom Shower/Tub: Multimedia programmer: Programmer, systems: Yes Tibbie: No  Discharge Living Setting Plans for Discharge Living Setting: House (son's house) Type of Home at Discharge: House Discharge Home Layout: One level Discharge Home Access: Stairs to enter Entrance  Stairs-Rails: Can reach both Entrance Stairs-Number of Steps: 3 Discharge Bathroom Shower/Tub: Tub/shower unit, Walk-in shower Discharge Bathroom Toilet: Standard Discharge Bathroom Accessibility: Yes How Accessible: Accessible via walker Does the patient have any problems obtaining your medications?: No (son mentioned pt one medication is expensive)  Social/Family/Support Systems Anticipated Caregiver: Woods Gangemi, son and other family Anticipated Caregiver's Contact Information: [phone] Caregiver Availability: 24/7 Discharge Plan Discussed with Primary Caregiver: Yes Is Caregiver In Agreement with Plan?: Yes Does Caregiver/Family have Issues with Lodging/Transportation while Pt is in Rehab?: No  Goals Patient/Family Goal for Rehab: *** Expected length of stay: *** Pt/Family Agrees to Admission and willing to participate: Yes Program Orientation Provided & Reviewed with Pt/Caregiver Including Roles  & Responsibilities: Yes  Decrease burden of Care  through IP rehab admission: NA  Possible need for SNF placement upon discharge: Not anticipated  Patient Condition: I have reviewed medical records from [facility], spoken with CM, and son. I discussed via phone for inpatient rehabilitation assessment.  Patient will benefit from ongoing PT, OT, and SLP, can actively participate in 3 hours of therapy a day 5 days of the week, and can make measurable gains during the admission.  Patient will also benefit from the coordinated team approach during an Inpatient Acute Rehabilitation admission.  The patient will receive intensive therapy as well as Rehabilitation physician, nursing, social worker, and care management interventions.  Due to bladder management, safety, disease management, medication administration, pain management, and patient education the patient requires 24 hour a day rehabilitation nursing.  The patient is currently *** with mobility and basic ADLs.  Discharge setting and therapy post discharge at home with home health is anticipated.  Patient has agreed to participate in the Acute Inpatient Rehabilitation Program and will admit {Time; today/tomorrow:10263}.  Preadmission Screen Completed By:  Bethel Born, 10/07/2021 2:14 PM ______________________________________________________________________   Discussed status with Dr. Marland Kitchen on *** at *** and received approval for admission today.  Admission Coordinator:  Bethel Born, CCC-SLP, time ***/Date ***   Assessment/Plan: Diagnosis: Does the need for close, 24 hr/day Medical supervision in concert with the patient's rehab needs make it unreasonable for this patient to be served in a less intensive setting? {yes_no_potentially:3041433} Co-Morbidities requiring supervision/potential complications: *** Due to {due HE:5277824}, does the patient require 24 hr/day rehab nursing? {yes_no_potentially:3041433} Does the patient require coordinated care of a  physician, rehab nurse, PT, OT, and SLP to address physical and functional deficits in the context of the above medical diagnosis(es)? {yes_no_potentially:3041433} Addressing deficits in the following areas: {deficits:3041436} Can the patient actively participate in an intensive therapy program of at least 3 hrs of therapy 5 days a week? {yes_no_potentially:3041433} The potential for patient to make measurable gains while on inpatient rehab is {potential:3041437} Anticipated functional outcomes upon discharge from inpatient rehab: {functional outcomes:304600100} PT, {functional outcomes:304600100} OT, {functional outcomes:304600100} SLP Estimated rehab length of stay to reach the above functional goals is: *** Anticipated discharge destination: {anticipated dc setting:21604} 10. Overall Rehab/Functional Prognosis: {potential:3041437}   MD Signature: ***

## 2021-10-08 LAB — GLUCOSE, CAPILLARY
Glucose-Capillary: 161 mg/dL — ABNORMAL HIGH (ref 70–99)
Glucose-Capillary: 238 mg/dL — ABNORMAL HIGH (ref 70–99)
Glucose-Capillary: 198 mg/dL — ABNORMAL HIGH (ref 70–99)
Glucose-Capillary: 264 mg/dL — ABNORMAL HIGH (ref 70–99)

## 2021-10-08 NOTE — Progress Notes (Addendum)
PROGRESS NOTE    Gerald Bauer  XIP:[id number] DOB: 24-Apr-1945 DOA: 10/05/2021 PCP: [name], PA-C    Brief Narrative:  76 year old gentleman with history of carotid stenosis, type 2 diabetes on oral hypoglycemics, peripheral artery disease, GERD, hyperlipidemia and hypertension presents to the ER with left-sided facial droop and unable to talk.  In the emergency room on room air.  Blood pressure stable.  Infection work-up negative.  CT head with hypoattenuation left frontal lobe and anterior left insula hypoattenuation.  Admitted with acute stroke. 7/15- appears MRD- plan for CIR if approved, awaiting dispo 7/16- BP improved, qualifies for CIR, awaiting disposition  Assessment & Plan:  Acute ischemic stroke: Clinical findings, expressive aphasia, right hemiparesis. CT head findings, hypoattenuation left frontal lobe and left insula. MRI of the brain, medium sized ischemic infarction left MCA territory. MRA of the head and neck, acute to subacute occlusion of the distal left ICA and MCA. 2D echocardiogram, pending. Antiplatelet therapy, on aspirin at home.  Neurology recommended to continue aspirin and Plavix indefinitely. LDL 44.  On Repatha.  On goal. Hemoglobin A1c, 7.1.  At goal.  Currently on sliding scale insulin.  On oral hypoglycemics that he can resume on discharge. PT/OT and neurology follow-up.  Speech swallow evaluation and cognition. Will benefit with acute inpatient rehab.  Type 2 diabetes, well controlled on metformin: Currently on SSI.  Hemoglobin A1c 7.1.  Resume metformin on discharge.  Essential hypertension: Blood pressures fairly stable.  Allow permissive hypertension.  Hyperlipidemia associated with type 2 diabetes: Intolerant to statin.  Continue Repatha.   DVT prophylaxis: enoxaparin (LOVENOX) injection 40 mg Start: 10/06/21 1000 SCD's Start: 10/05/21 1643   Code Status: Full code Family Communication: Son, daughter and daughter-in-law at the  bedside Disposition Plan: Status is: Inpatient Remains inpatient appropriate because: Acute a stroke.  Inpatient work-up.     Consultants:  Neurology  Procedures:  None  Antimicrobials:  None   Subjective: Still weak, difficulty speaking. Following commands, working with therapy.   Objective: Vitals:   10/07/21 0915 10/07/21 1650 10/07/21 2159 10/08/21 0504  BP: (!) 143/84 126/76 123/84 136/74  Pulse: 89 76 81 77  Resp: '18 18 18 17  '$ Temp: 99.4 F (37.4 C) 98.1 F (36.7 C) 97.7 F (36.5 C) 99.3 F (37.4 C)  TempSrc:   Oral   SpO2: 98% 98% 97% 100%  Weight:      Height:        Intake/Output Summary (Last 24 hours) at 10/08/2021 0846 Last data filed at 10/07/2021 1900 Gross per 24 hour  Intake 120 ml  Output --  Net 120 ml    Filed Weights   10/05/21 1405  Weight: 79.8 kg    Examination:  General exam: Appears calm and comfortable  Respiratory system: Clear to auscultation. Respiratory effort normal. Cardiovascular system: S1 & S2 heard, RRR. No JVD, murmurs, rubs, gallops or clicks. No pedal edema. Gastrointestinal system: Abdomen is nondistended, soft and nontender. No organomegaly or masses felt. Normal bowel sounds heard. Central nervous system: Alert and oriented.  Right ptosis Note [name] has Prominent right facial droop Right upper extremity 0/5 proximal group, 2/5 distal group. Right lower extremity 2/5 proximal, 3/5 distal Left upper and lower extremity normal strength.   Data Reviewed: I have personally reviewed following labs and imaging studies  CBC: Recent Labs  Lab 10/05/21 1407  WBC 7.9  NEUTROABS 4.7  HGB 14.4  HCT 43.0  MCV 90.0  PLT 673    Basic Metabolic Panel:  Recent Labs  Lab 10/05/21 1407  NA 137  K 3.8  CL 106  CO2 23  GLUCOSE 119*  BUN 12  CREATININE 0.90  CALCIUM 8.3*    GFR: Estimated Creatinine Clearance: 69.6 mL/min (by C-G formula based on SCr of 0.9 mg/dL). Liver Function Tests: Recent Labs  Lab  10/05/21 1407  AST 19  ALT 16  ALKPHOS 47  BILITOT 0.5  PROT 6.5  ALBUMIN 3.8    No results for input(s): "LIPASE", "AMYLASE" in the last 168 hours. No results for input(s): "AMMONIA" in the last 168 hours. Coagulation Profile: Recent Labs  Lab 10/05/21 1407  INR 1.1    Cardiac Enzymes: No results for input(s): "CKTOTAL", "CKMB", "CKMBINDEX", "TROPONINI" in the last 168 hours. BNP (last 3 results) No results for input(s): "PROBNP" in the last 8760 hours. HbA1C: Recent Labs    10/06/21 0510  HGBA1C 7.1*    CBG: Recent Labs  Lab 10/06/21 1955 10/07/21 0916 10/07/21 1134 10/07/21 1651 10/07/21 2201  GLUCAP 176* 178* 228* 122* 220*    Lipid Profile: Recent Labs    10/06/21 0510  CHOL 112  HDL 47  LDLCALC 44  TRIG 105  CHOLHDL 2.4    Thyroid Function Tests: No results for input(s): "TSH", "T4TOTAL", "FREET4", "T3FREE", "THYROIDAB" in the last 72 hours. Anemia Panel: No results for input(s): "VITAMINB12", "FOLATE", "FERRITIN", "TIBC", "IRON", "RETICCTPCT" in the last 72 hours. Sepsis Labs: No results for input(s): "PROCALCITON", "LATICACIDVEN" in the last 168 hours.  Recent Results (from the past 240 hour(s))  Resp Panel by RT-PCR (Flu A&B, Covid) Anterior Nasal Swab     Status: None   Collection Time: 10/05/21  2:07 PM   Specimen: Anterior Nasal Swab  Result Value Ref Range Status   SARS Coronavirus 2 by RT PCR NEGATIVE NEGATIVE Final    Comment: (NOTE) SARS-CoV-2 target nucleic acids are NOT DETECTED.  The SARS-CoV-2 RNA is generally detectable in upper respiratory specimens during the acute phase of infection. The lowest concentration of SARS-CoV-2 viral copies this assay can detect is 138 copies/mL. A negative result does not preclude SARS-Cov-2 infection and should not be used as the sole basis for treatment or other patient management decisions. A negative result may occur with  improper specimen collection/handling, submission of specimen  other than nasopharyngeal swab, presence of viral mutation(s) within the areas targeted by this assay, and inadequate number of viral copies(<138 copies/mL). A negative result must be combined with clinical observations, patient history, and epidemiological information. The expected result is Negative.  Fact Sheet for Patients:  EntrepreneurPulse.com.au  Fact Sheet for Healthcare Providers:  IncredibleEmployment.be  This test is no t yet approved or cleared by the [place] FDA and  has been authorized for detection and/or diagnosis of SARS-CoV-2 by FDA under an Emergency Use Authorization (EUA). This EUA will remain  in effect (meaning this test can be used) for the duration of the COVID-19 declaration under Section 564(b)(1) of the Act, 21 U.S.C.section 360bbb-3(b)(1), unless the authorization is terminated  or revoked sooner.       Influenza A by PCR NEGATIVE NEGATIVE Final   Influenza B by PCR NEGATIVE NEGATIVE Final    Comment: (NOTE) The Xpert Xpress SARS-CoV-2/FLU/RSV plus assay is intended as an aid in the diagnosis of influenza from Nasopharyngeal swab specimens and should not be used as a sole basis for treatment. Nasal washings and aspirates are unacceptable for Xpert Xpress SARS-CoV-2/FLU/RSV testing.  Fact Sheet for Patients: EntrepreneurPulse.com.au  Fact Sheet for  Healthcare Providers: IncredibleEmployment.be  This test is not yet approved or cleared by the [place] and has been authorized for detection and/or diagnosis of SARS-CoV-2 by FDA under an Emergency Use Authorization (EUA). This EUA will remain in effect (meaning this test can be used) for the duration of the COVID-19 declaration under Section 564(b)(1) of the Act, 21 U.S.C. section 360bbb-3(b)(1), unless the authorization is terminated or revoked.  Performed at [facility], [address]  [place], [place] [address]          Radiology Studies: ECHOCARDIOGRAM COMPLETE  Result Date: 10/06/2021    ECHOCARDIOGRAM REPORT   Patient Name:   Gerald Bauer [facility] Date of Exam: 10/06/2021 Medical Rec #:  [id number]        Height:       67.0 in Accession #:    [id number]       Weight:       176.0 lb Date of Birth:  02-Oct-1945         BSA:          1.915 m Patient Age:    52 years         BP:           150/105 mmHg Patient Gender: M                HR:           94 bpm. Exam Location:  [facility] Procedure: 2D Echo, Cardiac Doppler and Color Doppler Indications:     TIA G45.9                  Stroke I63.9  History:         Patient has no prior history of Echocardiogram examinations.  Sonographer:     Sherrie Sport Referring Phys:  [id number] AMY N COX Diagnosing Phys: Ida Rogue MD  Sonographer Comments: Suboptimal apical window. IMPRESSIONS  1. Left ventricular ejection fraction, by estimation, is 55 to 60%. The left ventricle has normal function. The left ventricle has no regional wall motion abnormalities. Left ventricular diastolic parameters are consistent with Grade I diastolic dysfunction (impaired relaxation).  2. Right ventricular systolic function is normal. The right ventricular size is normal. There is normal pulmonary artery systolic pressure. The estimated right ventricular systolic pressure is 67.2 mmHg.  3. The mitral valve is normal in structure. No evidence of mitral valve regurgitation. No evidence of mitral stenosis.  4. The aortic valve is normal in structure. Aortic valve regurgitation is not visualized. Aortic valve sclerosis is present, with no evidence of aortic valve stenosis.  5. The inferior vena cava is normal in size with greater than 50% respiratory variability, suggesting right atrial pressure of 3 mmHg. FINDINGS  Left Ventricle: Left ventricular ejection fraction, by estimation, is 55 to 60%. The left ventricle has normal function. The left ventricle has no regional wall motion  abnormalities. The left ventricular internal cavity size was normal in size. There is  no left ventricular hypertrophy. Left ventricular diastolic parameters are consistent with Grade I diastolic dysfunction (impaired relaxation). Right Ventricle: The right ventricular size is normal. No increase in right ventricular wall thickness. Right ventricular systolic function is normal. There is normal pulmonary artery systolic pressure. The tricuspid regurgitant velocity is 1.56 m/s, and  with an assumed right atrial pressure of 5 mmHg, the estimated right ventricular systolic pressure is 09.4 mmHg. Left Atrium: Left atrial size was normal in size. Right Atrium: Right atrial size was normal  in size. Pericardium: There is no evidence of pericardial effusion. Mitral Valve: The mitral valve is normal in structure. No evidence of mitral valve regurgitation. No evidence of mitral valve stenosis. Tricuspid Valve: The tricuspid valve is normal in structure. Tricuspid valve regurgitation is not demonstrated. No evidence of tricuspid stenosis. Aortic Valve: The aortic valve is normal in structure. Aortic valve regurgitation is not visualized. Aortic valve sclerosis is present, with no evidence of aortic valve stenosis. Aortic valve mean gradient measures 1.5 mmHg. Aortic valve peak gradient measures 1.9 mmHg. Aortic valve area, by VTI measures 4.26 cm. Pulmonic Valve: The pulmonic valve was normal in structure. Pulmonic valve regurgitation is not visualized. No evidence of pulmonic stenosis. Aorta: The aortic root is normal in size and structure. Venous: The inferior vena cava is normal in size with greater than 50% respiratory variability, suggesting right atrial pressure of 3 mmHg. IAS/Shunts: No atrial level shunt detected by color flow Doppler.  LEFT VENTRICLE PLAX 2D LVOT diam:     2.10 cm   Diastology LV SV:         44        LV e' medial:    9.46 cm/s LV SV Index:   23        LV E/e' medial:  5.8 LVOT Area:     3.46 cm  LV  e' lateral:   9.68 cm/s                          LV E/e' lateral: 5.7  RIGHT VENTRICLE RV S prime:     12.80 cm/s TAPSE (M-mode): 2.2 cm LEFT ATRIUM           Index LA diam:      2.90 cm 1.51 cm/m LA Vol (A4C): 38.4 ml 20.05 ml/m  AORTIC VALVE AV Area (Vmax):    3.32 cm AV Area (Vmean):   3.14 cm AV Area (VTI):     4.26 cm AV Vmax:           68.85 cm/s AV Vmean:          49.750 cm/s AV VTI:            0.102 m AV Peak Grad:      1.9 mmHg AV Mean Grad:      1.5 mmHg LVOT Vmax:         65.90 cm/s LVOT Vmean:        45.100 cm/s LVOT VTI:          0.126 m LVOT/AV VTI ratio: 1.23  AORTA Ao Root diam: 3.13 cm MITRAL VALVE               TRICUSPID VALVE MV Area (PHT): 4.15 cm    TR Peak grad:   9.7 mmHg MV Decel Time: 183 msec    TR Vmax:        156.00 cm/s MV E velocity: 55.30 cm/s MV A velocity: 87.80 cm/s  SHUNTS MV E/A ratio:  0.63        Systemic VTI:  0.13 m                            Systemic Diam: 2.10 cm Ida Rogue MD Electronically signed by Ida Rogue MD Signature Date/Time: 10/06/2021/5:23:05 PM    Final         Scheduled Meds:   stroke: early stages of recovery book  Does not apply Once   amLODipine  5 mg Oral Daily   aspirin EC  81 mg Oral Daily   cholecalciferol  1,000 Units Oral Daily   clopidogrel  75 mg Oral Daily   enoxaparin (LOVENOX) injection  40 mg Subcutaneous Q24H   insulin aspart  0-15 Units Subcutaneous TID WC   insulin aspart  0-5 Units Subcutaneous QHS   vitamin B-12  500 mcg Oral Daily   Continuous Infusions:   LOS: 2 days    Time spent: 35 minutes    Vanna Scotland, MD [facility] Pager [phone]

## 2021-10-08 NOTE — Progress Notes (Signed)
Speech Language Pathology Treatment:    Patient Details Name: Gerald Bauer MRN: [id number] DOB: 05-20-1945 Today's Date: 10/08/2021 Time: 0910-1000 [phone] speech/language tx; 0946-1000 swallowing tx) SLP Time Calculation (min) (ACUTE ONLY): 50 min  Assessment / Plan / Recommendation Clinical Impression  Pt seen for skilled speech/language tx and skilled dysphagia tx. Daughter, son, and daughter-in-law present for tx. Pt alert, pleasant, and cooperative. Family endorsed pt mainly communicating via yes/no and few single words.   Pt with good participation in speech/language tx. 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 During today's session, pt able to answer basic yes/no questions with ~80% accuracy; improving to ~90% with written cues and repetition of stimuli. Pt able to answer basic biographical questions (e.g. what's your name, where do you live) via fair-good verbal approximation ~25% of the time without cues; 75% of the time with max verbal and written cueing (e.g. f=3 written choices, carrier phrases, unison speech). Pt able to answer basic biographical questions via f=3 written choices with 100% accuracy. Pt participated in automatic speech tasks (e.g. 1-10, Birthday song) with fair approximations of targets ~25% the time with max verbal/tactile cueing (including unison speech, pacing on RUE). Across all tasks, pt's verbal responses with reduced voicing and reduced lip movement.   Pt and family educated re: communication strategies (e.g. written prompts, allowing time to respond, yes/no questions), environmental modifications to improve/support communication (e.g. reduction of environmental stimuli), pt's CLOF for communication, changes to communication following stroke, aphasia, and basic understanding of neuroplasticity. Pt nodding in agreement, ?full understanding given complexity of information in setting of aphasia. Family  verbalized understanding/agreement.   Pt with good participation in swallowing tx. Pt observed taking pills whole with applesauce as administered by RN and consuming pureed waffles and nectar-thick mild from meal tray. A few bites were consumed with [facility] assistance while utilizing R hand (dominant hand). Remained of meal was consumed with pt feeding self with L hand after meal set up and occasional support. Pt benefited from cueing to attend to items on R side of tray. Oral phase was functional with pureed and nectar-thick liquids. Very minimal oral residual appreciated with pureed which cleared with liquid wash. No overt or subtle s/sx pharyngeal dysphagia with medication as administered by RN or with items observed from meal tray. Seemingly improved ability for pt to take small/single bites/sips based on SLP observation and family report.  Reviewed diet recommendations, safe swallowing strategies/aspiration precautions, rationale for MBSS/MBSS procedure, and SLP POC for swallowing with pt and family. Pt nodding in agreement, ?full understanding given complexity of information in setting of aphasia. Family verbalized understanding/agreement.   Recommend continuation of pureed diet with nectar-thick liquids and safe swallowing strategies/aspiration precautions as outlined below.   SLP to f/u per POC for ongoing speech/language tx and dysphagia management including MBSS tentatively scheduled for next date.   RN educated re: Control and instrumentation engineer, environmental modifications to [employer], pt's CLOF for communication, diet recommendations, safe swallowing strategies/aspiration precautions, and SLP POC for speech/language and swallowing including plan for MBSS.    HPI HPI: Pt  is a 76 year old male with history of left-sided carotid stenosis, non-insulin-dependent diabetes mellitus type 2, PAD, GERD, mild COPD, history of tobacco use, hyperlipidemia, hypertension, who presents emergency  department for chief concerns of facial droop, expressive aphasia, and R sided weakness.  MRI/MRA Head and Neck Imaging: MRI HEAD IMPRESSION:     1. 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  No associated hemorrhage or significant regional mass effect.  2. Loss of normal flow voids throughout the left ICA and MCA,  consistent with slow flow and/or occlusion. Susceptibility artifact  within proximal left MCA branches consistent with intraluminal  thrombus.  3. Underlying mild chronic microvascular ischemic disease. Tiny  remote left ACA distribution infarct.     MRA HEAD IMPRESSION:   "1. Interval near complete occlusion of the left ICA and MCA,  presumably acute in nature given the presence of the acute left MCA  territory infarct. Left A1 segment not well seen either, also likely  occluded.  2. Otherwise wide patency of the major intracranial arterial  circulation. No other hemodynamically significant or correctable  stenosis.     MRA NECK IMPRESSION:  1. 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 Some attenuated flow seen  distally within the cervical left ICA, with subsequent reocclusion  by the skull base.  2. 50% atheromatous stenosis at the origin of the cervical right  ICA. Otherwise wide patency of the right carotid artery system.  3. Wide patency of the vertebral arteries within the neck.".      SLP Plan  Continue with current plan of care;MBS (MBSS tentatively planned for 10/08/21)      Recommendations for follow up therapy are one component of a multi-disciplinary discharge planning process, led by the attending physician.  Recommendations may be updated based on patient status, additional functional criteria and insurance authorization.    Recommendations  Diet recommendations: Dysphagia 1 (puree);Nectar-thick liquid Liquids provided via: Cup Medication Administration: Whole meds with  puree Supervision: Full supervision/cueing for compensatory strategies (set up assistance and assistance with feeding PRN) Compensations: Slow rate;Small sips/bites;Minimize environmental distractions;Lingual sweep for clearance of pocketing Postural Changes and/or Swallow Maneuvers: Out of bed for meals;Seated upright 90 degrees;Upright 30-60 min after meal                General recommendations: Rehab consult Oral Care Recommendations: Oral care BID;Oral care before and after PO;Staff/trained caregiver to provide oral care Follow Up Recommendations: Acute inpatient rehab (3hours/day) Assistance recommended at discharge: Frequent or constant Supervision/Assistance SLP Visit Diagnosis: Aphasia (R47.01);Apraxia (R48.2);Dysphagia, oropharyngeal phase (R13.12) Plan: Continue with current plan of care;MBS (MBSS tentatively planned for 10/08/21)          Cherrie Gauze, M.S., [facility] [phone] ([place])   Quintella Baton  10/08/2021, 11:15 AM

## 2021-10-08 NOTE — Plan of Care (Signed)
  Problem: Education: Goal: Ability to describe self-care measures that may prevent or decrease complications (Diabetes Survival Skills Education) will improve Outcome: Progressing Goal: Individualized Educational Video(s) Outcome: Progressing   Problem: Coping: Goal: Ability to adjust to condition or change in health will improve Outcome: Progressing   Problem: Fluid Volume: Goal: Ability to maintain a balanced intake and output will improve Outcome: Progressing   Problem: Health Behavior/Discharge Planning: Goal: Ability to identify and utilize available resources and services will improve Outcome: Progressing Goal: Ability to manage health-related needs will improve Outcome: Progressing   Problem: Metabolic: Goal: Ability to maintain appropriate glucose levels will improve Outcome: Progressing   Problem: Nutritional: Goal: Maintenance of adequate nutrition will improve Outcome: Progressing Goal: Progress toward achieving an optimal weight will improve Outcome: Progressing   Problem: Skin Integrity: Goal: Risk for impaired skin integrity will decrease Outcome: Progressing   Problem: Tissue Perfusion: Goal: Adequacy of tissue perfusion will improve Outcome: Progressing   Problem: Education: Goal: Knowledge of General Education information will improve Description: Including pain rating scale, medication(s)/side effects and non-pharmacologic comfort measures Outcome: Progressing   Problem: Health Behavior/Discharge Planning: Goal: Ability to manage health-related needs will improve Outcome: Progressing   Problem: Clinical Measurements: Goal: Ability to maintain clinical measurements within normal limits will improve Outcome: Progressing Goal: Will remain free from infection Outcome: Progressing Goal: Diagnostic test results will improve Outcome: Progressing Goal: Respiratory complications will improve Outcome: Progressing Goal: Cardiovascular complication will  be avoided Outcome: Progressing   Problem: Activity: Goal: Risk for activity intolerance will decrease Outcome: Progressing   Problem: Nutrition: Goal: Adequate nutrition will be maintained Outcome: Progressing   Problem: Coping: Goal: Level of anxiety will decrease Outcome: Progressing   Problem: Elimination: Goal: Will not experience complications related to bowel motility Outcome: Progressing Goal: Will not experience complications related to urinary retention Outcome: Progressing   Problem: Pain Managment: Goal: General experience of comfort will improve Outcome: Progressing   Problem: Safety: Goal: Ability to remain free from injury will improve Outcome: Progressing   Problem: Skin Integrity: Goal: Risk for impaired skin integrity will decrease Outcome: Progressing   Problem: Education: Goal: Knowledge of disease or condition will improve Outcome: Progressing Goal: Knowledge of secondary prevention will improve (SELECT ALL) Outcome: Progressing Goal: Knowledge of patient specific risk factors will improve (INDIVIDUALIZE FOR PATIENT) Outcome: Progressing Goal: Individualized Educational Video(s) Outcome: Progressing   Problem: Coping: Goal: Will verbalize positive feelings about self Outcome: Progressing Goal: Will identify appropriate support needs Outcome: Progressing   Problem: Health Behavior/Discharge Planning: Goal: Ability to manage health-related needs will improve Outcome: Progressing   Problem: Self-Care: Goal: Ability to participate in self-care as condition permits will improve Outcome: Progressing Goal: Verbalization of feelings and concerns over difficulty with self-care will improve Outcome: Progressing Goal: Ability to communicate needs accurately will improve Outcome: Progressing   Problem: Nutrition: Goal: Risk of aspiration will decrease Outcome: Progressing Goal: Dietary intake will improve Outcome: Progressing   Problem:  Intracerebral Hemorrhage Tissue Perfusion: Goal: Complications of Intracerebral Hemorrhage will be minimized Outcome: Progressing   Problem: Ischemic Stroke/TIA Tissue Perfusion: Goal: Complications of ischemic stroke/TIA will be minimized Outcome: Progressing   Problem: Spontaneous Subarachnoid Hemorrhage Tissue Perfusion: Goal: Complications of Spontaneous Subarachnoid Hemorrhage will be minimized Outcome: Progressing

## 2021-10-08 NOTE — Progress Notes (Signed)
   10/07/21 2000  Mobility  HOB Elevated/Bed Position HOB 45  Activity Transferred from chair to bed  Range of Motion/Exercises Active;Left arm;Right leg;Left leg  Level of Assistance Maximum assist, patient does 25-49%  Assistive Device None  Distance Ambulated (ft) 5 ft  Activity Response Tolerated well

## 2021-10-08 NOTE — Progress Notes (Addendum)
Physical Therapy Treatment Patient Details Name: Gerald Bauer MRN: [id number] DOB: Oct 25, 1945 Today's Date: 10/08/2021   History of Present Illness Pt is a 76 year old male with history of left-sided carotid stenosis, non-insulin-dependent diabetes mellitus type 2, PAD, GERD, mild COPD, history of tobacco use, hyperlipidemia, hypertension, who presents to ED for chief concerns of facial droop on the left side and R sided weakness, expressive aphasia. MD assessment includes aphasia w/ stroke-like symptoms. 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 Pt does not verbally communicate during session but does nod head yes/no. Pt requires min assist for supine>sit with heavy reliance on hospital bed features & stand pivot with mod assist with cuing for technique. Session focused on RLE strengthening & NMR through STS exercise, stepping exercise, & gait with hemiwalker. Pt is making good progress with functional mobility & remains an excellent CIR candidate. Will continue to follow pt acutely to address R NMR, balance, and gait with LRAD.  Addendum: educated pt & family on stroke & stroke recovery.     Recommendations for follow up therapy are one component of a multi-disciplinary discharge planning process, led by the attending physician.  Recommendations may be updated based on patient status, additional functional criteria and insurance authorization.  Follow Up Recommendations  Acute inpatient rehab (3hours/day)     Assistance Recommended at Discharge Frequent or constant Supervision/Assistance  Patient can return home with the following Assist for transportation;Help with stairs or ramp for entrance;Two people to help with walking and/or transfers;A lot of help with bathing/dressing/bathroom;Assistance with cooking/housework    Equipment Recommendations   (TBD in next venue of care)    Recommendations for Other Services Rehab consult     Precautions / Restrictions Precautions Precautions: Fall Precaution Comments: R hemi Restrictions Weight Bearing Restrictions: No     Mobility  Bed Mobility Overal bed mobility: Needs Assistance Bed Mobility: Supine to Sit   Sidelying to sit: Min assist, HOB elevated (Cuing to push RLE to EOB with LLE with pt able to move RLE somewhat first, extra time to upright trunk, heavy reliance on hospital bed features)            Transfers Overall transfer level: Needs assistance Equipment used: None, Hemi-walker Transfers: Sit to/from Stand Sit to Stand: Min assist (education re: safe hand placement to push to standing then place LUE on RW, STS from recliner with armrest with min assist, mod assist for STS from standard chair without armrests) Stand pivot transfers: Mod assist (to R with cuing for hand placement & sequencing, assistance with pivoting)              Ambulation/Gait Ambulation/Gait assistance: Mod assist, Max assist Gait Distance (Feet): 12 Feet (+ 4 ft) Assistive device: Hemi-walker Gait Pattern/deviations: Decreased step length - right, Decreased step length - left, Decreased stride length, Step-to pattern, Decreased weight shift to right, Decreased weight shift to left, Decreased stance time - right Gait velocity: decreased     General Gait Details: Pt ambulates with hemiwalker with mod<>max assist with cuing for RLE knee extension in stance phase, weight shifting to L for increased ease of RLE foot advancement, assistance to advance RLE but towards end of gait pt able to advance RLE with extra time, PT providing blocking at R knee to prevent buckling, cuing for gait pattern with AD  Stairs             Wheelchair Mobility    Modified Rankin (Stroke Patients Only)       Balance Overall balance assessment: Needs  assistance Sitting-balance support: Feet supported, Bilateral upper extremity supported Sitting balance-Leahy Scale: Fair     Standing balance support: Single extremity supported, During functional activity, Reliant on assistive device for balance Standing balance-Leahy Scale: Poor Standing balance comment: R lateral lean on PT in standing/gait                            Cognition Arousal/Alertness: Awake/alert Behavior During Therapy: WFL for tasks assessed/performed, Flat affect Overall Cognitive Status: Within Functional Limits for tasks assessed                                 General Comments: Aphasia, nods yes/no throughout session, follows simple instructions throughout session        Exercises Other Exercises Other Exercises: Pt engaged in STS activity (~6-7 repetitions) with mod/max assist with cuing for technique with pt placing LUE on RLE & cuing for increased RLE activation & terminal knee extension in standing to promote RLE strengthening & NMR. Pt requires seated rest break halfway through 2/2 fatigue. Other Exercises: Pt stood with LUE on hemiwalker & mod/max assist from PT with PT providing A/P blocking R knee to prevent buckling with pt performing repetetive stepping forwards/backwards with LLE to promote increased use of RLE for strengthening & NMR. Pt attempts to perform task without LUE support on hemiwalker with ongoing max assist for standing. Other Exercises: Educated pt on head/hips relationship to increase ease of movement, especially during transfers.    General Comments        Pertinent Vitals/Pain Pain Assessment Pain Assessment: No/denies pain    Home Living                          Prior Function            PT Goals (current goals can now be found in the care plan section) Acute Rehab PT Goals Patient Stated Goal: get better PT Goal Formulation: With patient/family Time For Goal Achievement:  10/19/21 Potential to Achieve Goals: Good Progress towards PT goals: Progressing toward goals    Frequency    7X/week      PT Plan Current plan remains appropriate    Co-evaluation              AM-PAC PT "6 Clicks" Mobility   Outcome Measure  Help needed turning from your back to your side while in a flat bed without using bedrails?: A Little Help needed moving from lying on your back to sitting on the side of a flat bed without using bedrails?: A Little Help needed moving to and from a bed to a chair (including a wheelchair)?: A Lot Help needed standing up from a chair using your arms (e.g., wheelchair or bedside chair)?: A Lot Help needed to walk in hospital room?: Total Help needed climbing 3-5 steps with a railing? : Total 6 Click Score: 12    End of Session Equipment Utilized During Treatment: Gait belt Activity Tolerance: Patient tolerated treatment well;Patient limited by fatigue Patient left: in chair;with chair alarm set;with call bell/phone within reach;with family/visitor present   PT Visit Diagnosis: Unsteadiness on feet (R26.81);Muscle weakness (generalized) (M62.81);Hemiplegia  and hemiparesis;Other abnormalities of gait and mobility (R26.89);Difficulty in walking, not elsewhere classified (R26.2) Hemiplegia - Right/Left: Right Hemiplegia - dominant/non-dominant: Dominant Hemiplegia - caused by: Cerebral infarction     Time: 7408-1448 PT Time Calculation (min) (ACUTE ONLY): 30 min  Charges:  $Neuromuscular Re-education: 23-37 mins                     Lavone Nian, PT, DPT 10/08/21, 2:35 PM    Waunita Schooner 10/08/2021, 2:33 PM

## 2021-10-09 ENCOUNTER — Inpatient Hospital Stay: Payer: Medicare Other

## 2021-10-09 ENCOUNTER — Encounter (HOSPITAL_COMMUNITY): Payer: Self-pay | Admitting: Physical Medicine and Rehabilitation

## 2021-10-09 ENCOUNTER — Inpatient Hospital Stay (HOSPITAL_COMMUNITY)
Admission: RE | Admit: 2021-10-09 | Discharge: 2021-10-31 | DRG: 057 | Disposition: A | Discharge status: Home / Self Care | Payer: Medicare Other | Source: Other Acute Inpatient Hospital | Attending: Physical Medicine and Rehabilitation | Admitting: Physical Medicine and Rehabilitation

## 2021-10-09 ENCOUNTER — Other Ambulatory Visit: Payer: Self-pay

## 2021-10-09 DIAGNOSIS — Z9079 Acquired absence of other genital organ(s): Secondary | ICD-10-CM

## 2021-10-09 DIAGNOSIS — Z20822 Contact with and (suspected) exposure to covid-19: Secondary | ICD-10-CM | POA: Diagnosis present

## 2021-10-09 DIAGNOSIS — E785 Hyperlipidemia, unspecified: Secondary | ICD-10-CM | POA: Diagnosis present

## 2021-10-09 DIAGNOSIS — K5900 Constipation, unspecified: Secondary | ICD-10-CM | POA: Diagnosis present

## 2021-10-09 DIAGNOSIS — Z83438 Family history of other disorder of lipoprotein metabolism and other lipidemia: Secondary | ICD-10-CM

## 2021-10-09 DIAGNOSIS — G4700 Insomnia, unspecified: Secondary | ICD-10-CM | POA: Diagnosis not present

## 2021-10-09 DIAGNOSIS — I63512 Cerebral infarction due to unspecified occlusion or stenosis of left middle cerebral artery: Secondary | ICD-10-CM

## 2021-10-09 DIAGNOSIS — Z7982 Long term (current) use of aspirin: Secondary | ICD-10-CM | POA: Diagnosis not present

## 2021-10-09 DIAGNOSIS — Z825 Family history of asthma and other chronic lower respiratory diseases: Secondary | ICD-10-CM

## 2021-10-09 DIAGNOSIS — Z6825 Body mass index (BMI) 25.0-25.9, adult: Secondary | ICD-10-CM

## 2021-10-09 DIAGNOSIS — Z8042 Family history of malignant neoplasm of prostate: Secondary | ICD-10-CM

## 2021-10-09 DIAGNOSIS — I69391 Dysphagia following cerebral infarction: Secondary | ICD-10-CM | POA: Diagnosis not present

## 2021-10-09 DIAGNOSIS — R339 Retention of urine, unspecified: Secondary | ICD-10-CM | POA: Diagnosis not present

## 2021-10-09 DIAGNOSIS — Z833 Family history of diabetes mellitus: Secondary | ICD-10-CM | POA: Diagnosis not present

## 2021-10-09 DIAGNOSIS — R3915 Urgency of urination: Secondary | ICD-10-CM | POA: Diagnosis present

## 2021-10-09 DIAGNOSIS — Z87891 Personal history of nicotine dependence: Secondary | ICD-10-CM

## 2021-10-09 DIAGNOSIS — I69351 Hemiplegia and hemiparesis following cerebral infarction affecting right dominant side: Principal | ICD-10-CM

## 2021-10-09 DIAGNOSIS — E669 Obesity, unspecified: Secondary | ICD-10-CM | POA: Diagnosis not present

## 2021-10-09 DIAGNOSIS — Z823 Family history of stroke: Secondary | ICD-10-CM | POA: Diagnosis not present

## 2021-10-09 DIAGNOSIS — E119 Type 2 diabetes mellitus without complications: Secondary | ICD-10-CM | POA: Diagnosis present

## 2021-10-09 DIAGNOSIS — I69320 Aphasia following cerebral infarction: Secondary | ICD-10-CM | POA: Diagnosis not present

## 2021-10-09 DIAGNOSIS — Z7984 Long term (current) use of oral hypoglycemic drugs: Secondary | ICD-10-CM

## 2021-10-09 DIAGNOSIS — Z8249 Family history of ischemic heart disease and other diseases of the circulatory system: Secondary | ICD-10-CM

## 2021-10-09 DIAGNOSIS — Z9049 Acquired absence of other specified parts of digestive tract: Secondary | ICD-10-CM | POA: Diagnosis not present

## 2021-10-09 DIAGNOSIS — R000 Tachycardia, unspecified: Secondary | ICD-10-CM | POA: Diagnosis not present

## 2021-10-09 DIAGNOSIS — R531 Weakness: Secondary | ICD-10-CM | POA: Diagnosis present

## 2021-10-09 DIAGNOSIS — K5901 Slow transit constipation: Secondary | ICD-10-CM | POA: Diagnosis not present

## 2021-10-09 DIAGNOSIS — E663 Overweight: Secondary | ICD-10-CM | POA: Diagnosis present

## 2021-10-09 DIAGNOSIS — E1169 Type 2 diabetes mellitus with other specified complication: Secondary | ICD-10-CM | POA: Diagnosis not present

## 2021-10-09 DIAGNOSIS — E8809 Other disorders of plasma-protein metabolism, not elsewhere classified: Secondary | ICD-10-CM | POA: Diagnosis present

## 2021-10-09 DIAGNOSIS — R482 Apraxia: Secondary | ICD-10-CM | POA: Diagnosis present

## 2021-10-09 DIAGNOSIS — I10 Essential (primary) hypertension: Secondary | ICD-10-CM | POA: Diagnosis present

## 2021-10-09 DIAGNOSIS — Z79899 Other long term (current) drug therapy: Secondary | ICD-10-CM | POA: Diagnosis not present

## 2021-10-09 DIAGNOSIS — M79641 Pain in right hand: Secondary | ICD-10-CM | POA: Diagnosis not present

## 2021-10-09 DIAGNOSIS — M19041 Primary osteoarthritis, right hand: Secondary | ICD-10-CM | POA: Diagnosis present

## 2021-10-09 DIAGNOSIS — M19011 Primary osteoarthritis, right shoulder: Secondary | ICD-10-CM | POA: Diagnosis present

## 2021-10-09 DIAGNOSIS — Z8546 Personal history of malignant neoplasm of prostate: Secondary | ICD-10-CM | POA: Diagnosis not present

## 2021-10-09 DIAGNOSIS — R4701 Aphasia: Secondary | ICD-10-CM | POA: Diagnosis not present

## 2021-10-09 HISTORY — DX: Cerebral infarction due to unspecified occlusion or stenosis of left middle cerebral artery: I63.512

## 2021-10-09 LAB — GLUCOSE, CAPILLARY
Glucose-Capillary: 188 mg/dL — ABNORMAL HIGH (ref 70–99)
Glucose-Capillary: 172 mg/dL — ABNORMAL HIGH (ref 70–99)
Glucose-Capillary: 190 mg/dL — ABNORMAL HIGH (ref 70–99)
Glucose-Capillary: 193 mg/dL — ABNORMAL HIGH (ref 70–99)

## 2021-10-09 LAB — CBC
HCT: 45.2 % (ref 39.0–52.0)
Hemoglobin: 15.8 g/dL (ref 13.0–17.0)
MCH: 30.6 pg (ref 26.0–34.0)
MCHC: 35 g/dL (ref 30.0–36.0)
MCV: 87.6 fL (ref 80.0–100.0)
Platelets: 214 10*3/uL (ref 150–400)
RBC: 5.16 MIL/uL (ref 4.22–5.81)
RDW: 12.2 % (ref 11.5–15.5)
WBC: 7.2 10*3/uL (ref 4.0–10.5)
nRBC: 0 % (ref 0.0–0.2)

## 2021-10-09 LAB — CREATININE, SERUM
Creatinine, Ser: 0.83 mg/dL (ref 0.61–1.24)
GFR, Estimated: >60 mL/min (ref >60)

## 2021-10-09 MED ORDER — ENOXAPARIN SODIUM 40 MG/0.4ML IJ SOSY: 40.0000 mg | PREFILLED_SYRINGE | INTRAMUSCULAR | Status: DC

## 2021-10-09 MED ORDER — ASPIRIN 81 MG PO TBEC
81.0000 mg | DELAYED_RELEASE_TABLET | Freq: Every day | ORAL | Status: DC
  Administered 2021-10-10 – 2021-10-31 (×22): 81 mg via ORAL
  Filled 2021-10-09 (×22): qty 1

## 2021-10-09 MED ORDER — AMLODIPINE BESYLATE 5 MG PO TABS: 5.0000 mg | ORAL_TABLET | Freq: Every day | ORAL | 0 refills | Status: DC

## 2021-10-09 MED ORDER — VITAMIN B-12 1000 MCG PO TABS
500.0000 ug | ORAL_TABLET | Freq: Every day | ORAL | Status: DC
  Administered 2021-10-10 – 2021-10-31 (×22): 500 ug via ORAL
  Filled 2021-10-09 (×23): qty 1

## 2021-10-09 MED ORDER — ALBUTEROL SULFATE (2.5 MG/3ML) 0.083% IN NEBU: 2.5000 mg | INHALATION_SOLUTION | Freq: Four times a day (QID) | RESPIRATORY_TRACT | Status: DC | PRN

## 2021-10-09 MED ORDER — ACETAMINOPHEN 325 MG PO TABS: 650.0000 mg | ORAL_TABLET | ORAL | Status: DC | PRN

## 2021-10-09 MED ORDER — ACETAMINOPHEN 160 MG/5ML PO SOLN: 650.0000 mg | ORAL | Status: DC | PRN

## 2021-10-09 MED ORDER — ACETAMINOPHEN 650 MG RE SUPP: 650.0000 mg | RECTAL | Status: DC | PRN

## 2021-10-09 MED ORDER — INSULIN ASPART 100 UNIT/ML IJ SOLN
0.0000 [IU] | Freq: Three times a day (TID) | INTRAMUSCULAR | Status: DC
  Administered 2021-10-09: 3 [IU] via SUBCUTANEOUS
  Administered 2021-10-10 (×2): 2 [IU] via SUBCUTANEOUS
  Administered 2021-10-10: 5 [IU] via SUBCUTANEOUS
  Administered 2021-10-11 – 2021-10-12 (×6): 3 [IU] via SUBCUTANEOUS
  Administered 2021-10-13: 5 [IU] via SUBCUTANEOUS
  Administered 2021-10-13 (×2): 3 [IU] via SUBCUTANEOUS
  Administered 2021-10-14: 5 [IU] via SUBCUTANEOUS
  Administered 2021-10-14: 3 [IU] via SUBCUTANEOUS
  Administered 2021-10-14 – 2021-10-15 (×2): 2 [IU] via SUBCUTANEOUS
  Administered 2021-10-15 – 2021-10-16 (×4): 3 [IU] via SUBCUTANEOUS
  Administered 2021-10-16 – 2021-10-17 (×2): 2 [IU] via SUBCUTANEOUS
  Administered 2021-10-17 – 2021-10-18 (×2): 3 [IU] via SUBCUTANEOUS
  Administered 2021-10-18: 2 [IU] via SUBCUTANEOUS

## 2021-10-09 MED ORDER — ENOXAPARIN SODIUM 40 MG/0.4ML IJ SOSY
40.0000 mg | PREFILLED_SYRINGE | INTRAMUSCULAR | Status: DC
  Administered 2021-10-10 – 2021-10-18 (×9): 40 mg via SUBCUTANEOUS
  Filled 2021-10-09 (×9): qty 0.4

## 2021-10-09 MED ORDER — AMLODIPINE BESYLATE 5 MG PO TABS
5.0000 mg | ORAL_TABLET | Freq: Every day | ORAL | Status: DC
  Administered 2021-10-10 – 2021-10-31 (×22): 5 mg via ORAL
  Filled 2021-10-09 (×22): qty 1

## 2021-10-09 MED ORDER — CLOPIDOGREL BISULFATE 75 MG PO TABS
75.0000 mg | ORAL_TABLET | Freq: Every day | ORAL | Status: DC
  Administered 2021-10-10 – 2021-10-31 (×22): 75 mg via ORAL
  Filled 2021-10-09 (×22): qty 1

## 2021-10-09 MED ORDER — VITAMIN D 25 MCG (1000 UNIT) PO TABS
1000.0000 [IU] | ORAL_TABLET | Freq: Every day | ORAL | Status: DC
  Administered 2021-10-10 – 2021-10-31 (×22): 1000 [IU] via ORAL
  Filled 2021-10-09 (×22): qty 1

## 2021-10-09 MED ORDER — SENNOSIDES-DOCUSATE SODIUM 8.6-50 MG PO TABS: 1.0000 | ORAL_TABLET | Freq: Every evening | ORAL | Status: DC | PRN

## 2021-10-09 MED ORDER — CLOPIDOGREL BISULFATE 75 MG PO TABS: 75.0000 mg | ORAL_TABLET | Freq: Every day | ORAL | 0 refills | Status: DC

## 2021-10-09 NOTE — Progress Notes (Signed)
Inpatient Rehab Admissions Coordinator:  Spoke with pt's son [name]. Reviewed pt's benefits and cost of CIR. He acknowledged understanding. Insurance authorization started. Will continue to follow.   Gayland Curry, [place], [place] Admissions Coordinator [phone]

## 2021-10-09 NOTE — Discharge Summary (Addendum)
Physician Discharge Summary   Patient: Gerald Bauer MRN: [id number] DOB: 12-02-1945  Admit date:     10/05/2021  Discharge date: 10/09/21  Discharge Physician: Vanna Scotland   PCP: [name], PA-C  Discharge Diagnoses: Principal Problem:   Aphasia Active Problems:   Hyperlipidemia associated with type 2 diabetes mellitus ([place])   Hypertension associated with type 2 diabetes mellitus ([place])   Carotid stenosis   GERD (gastroesophageal reflux disease)   Statin myopathy   Aortic atherosclerosis ([place])   Type 2 diabetes mellitus without complication, without long-term current use of insulin ([place])   Stroke (cerebrum) ([place])  Resolved Problems:   Altered mental status  Hospital Course: [name] is a 76 year old male with history of left-sided carotid stenosis, non-insulin-dependent diabetes mellitus type 2, PAD, GERD, mild COPD, history of tobacco use, hyperlipidemia, hypertension, who presents emergency department for chief concerns of facial droop on the left side, expressive aphasia.   Assessment and Plan:  Acute ischemic stroke: Clinical findings- (expressive aphasia, right hemiparesis.) CT head - hypoattenuation left frontal lobe and left insula. MRI brain -  medium sized ischemic infarction left MCA territory.  MRA head and neck, acute to subacute occlusion of the distal left ICA and MCA.  Failed Aspiring therapy alone-->Neurology recommended to continue aspirin and Plavix indefinitely. LDL 44.  On Repatha.  On goal. Hemoglobin A1c, 7.1.  At goal.  Currently on sliding scale insulin.  On oral hypoglycemics that he can resume on discharge. Echo- EF 55%, no WMA, G1DD. No septal defect, no  -->Rehab candidate  Type 2 diabetes, well controlled on metformin: Currently on SSI.  Hemoglobin A1c 7.1.  Resume metformin on discharge.   Essential hypertension: Blood pressures fairly stable.  Allow permissive hypertension.   Hyperlipidemia associated with type 2 diabetes: Intolerant  to statin.  Continue Repatha.       Consultants: Neurology Disposition: Rehabilitation facility Diet recommendation:  Cardiac and Carb modified diet DISCHARGE MEDICATION: Allergies as of 10/09/2021       Reactions   Amoxicillin Swelling   Statins Other (See Comments)   Affects the liver   Welchol [colesevelam Hcl] Other (See Comments)   affects the liver        Medication List     TAKE these medications    albuterol 108 (90 Base) MCG/ACT inhaler Commonly known as: VENTOLIN HFA Inhale 2 puffs into the lungs every 6 (six) hours as needed for wheezing or shortness of breath.   amLODipine 5 MG tablet Commonly known as: NORVASC Take 1 tablet (5 mg total) by mouth daily. Start taking on: October 10, 2021   aspirin EC 81 MG tablet Take 81 mg by mouth daily. Swallow whole.   clopidogrel 75 MG tablet Commonly known as: PLAVIX Take 1 tablet (75 mg total) by mouth daily. Start taking on: October 10, 2021   metFORMIN 500 MG 24 hr tablet Commonly known as: GLUCOPHAGE-XR TAKE 1 TABLET BY MOUTH  TWICE DAILY WITH A MEAL   OneTouch Delica Plus [id number] Misc USE TO CHECK BLOOD GLUCOSE  ONCE DAILY AS DIRECTED   OneTouch Verio test strip Generic drug: glucose blood CHECK FINGERSTICK BLOOD  SUGARS ONCE DAILY   Repatha SureClick 782 MG/ML Soaj Generic drug: Evolocumab INJECT '140MG'$  SUBCUTANEOUSLY  EVERY 2 WEEKS   Vitamin B12 500 MCG Tabs Take 500 mcg by mouth daily.   Vitamin D3 25 MCG (1000 UT) Caps Take 1,000 Units by mouth daily.        Discharge Exam: [employer]  10/05/21 1405  Weight: 79.8 kg   General exam: Appears calm and comfortable  Respiratory system: Clear to auscultation. Respiratory effort normal. Cardiovascular system: S1 & S2 heard, RRR. No JVD, murmurs, rubs, gallops or clicks. No pedal edema. Gastrointestinal system: Abdomen is nondistended, soft and nontender. No organomegaly or masses felt. Normal bowel sounds heard. Central nervous system:  Alert and oriented.  Right ptosis Note Gerald Bauer has Prominent right facial droop Right upper extremity 0/5 proximal group, 2/5 distal group. Right lower extremity 2/5 proximal, 3/5 distal Left upper and lower extremity normal strength.    Condition at discharge: good  The results of significant diagnostics from this hospitalization (including imaging, microbiology, ancillary and laboratory) are listed below for reference.   Imaging Studies: DG Swallowing Func-Speech Pathology  Result Date: 10/09/2021 Gerald Bauer     10/09/2021 10:12 AM Objective Swallowing Evaluation: Type of Study: MBS-Modified Barium Swallow Study  Patient Details Name: Gerald Bauer MRN: [id number] Date of Birth: October 15, 1945 Today's Date: 10/09/2021 Time: SLP Start Time (ACUTE ONLY): 0815 -SLP Stop Time (ACUTE ONLY): 0840 SLP Time Calculation (min) (ACUTE ONLY): 25 min Past Medical History: Past Medical History: Diagnosis Date  Acid reflux   Arthritis   hands  Carotid atherosclerosis   Diabetes mellitus without complication (HCC)   Hyperlipemia   Hypertension   Prostate cancer ([place])   history  Tachycardia  Past Surgical History: Past Surgical History: Procedure Laterality Date  APPENDECTOMY  1973  CHOLECYSTECTOMY  2013  PROSTATECTOMY  2012 HPI: Pt  is a 76 year old male with history of left-sided carotid stenosis, non-insulin-dependent diabetes mellitus type 2, PAD, GERD, mild COPD, history of tobacco use, hyperlipidemia, hypertension, who presents emergency department for chief concerns of facial droop, expressive aphasia, and R sided weakness.  MRI/MRA Head and Neck Imaging: MRI HEAD IMPRESSION:     1. 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 No associated hemorrhage or significant regional mass effect.  2. Loss of normal flow voids throughout the left ICA and MCA,  consistent with slow flow and/or occlusion. Susceptibility artifact  within proximal left MCA branches consistent with  intraluminal  thrombus.  3. Underlying mild chronic microvascular ischemic disease. Tiny  remote left ACA distribution infarct.     MRA HEAD IMPRESSION:   "1. Interval near complete occlusion of the left ICA and MCA,  presumably acute in nature given the presence of the acute left MCA  territory infarct. Left A1 segment not well seen either, also likely  occluded.  2. Otherwise wide patency of the major intracranial arterial  circulation. No other hemodynamically significant or correctable  stenosis.     MRA NECK IMPRESSION:  1. 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 Some attenuated flow seen  distally within the cervical left ICA, with subsequent reocclusion  by the skull base.  2. 50% atheromatous stenosis at the origin of the cervical right  ICA. Otherwise wide patency of the right carotid artery system.  3. Wide patency of the vertebral arteries within the neck.".  Subjective: Pt alert, pleasant, and cooperative. Mainly non-verbal with exception of a few automatic utterances. Communicating via gestural communication and facial expression.  Recommendations for follow up therapy are one component of a multi-disciplinary discharge planning process, led by the attending physician.  Recommendations may be updated based on patient status, additional functional criteria and insurance authorization. Assessment / Plan / Recommendation   10/09/2021  9:45 AM Clinical Impressions Clinical Impression Pt seen for MBSS. Pt alert, pleasant, and cooperative. Mainly non-verbal with exception of a few automatic utterances. Communicating via gestural communication and facial expressions. Pt presents with a moderate oropharyngeal dysphagia. Oral phase notable for prolonged inefficient mastication of solids and mild oral residual with pureed and solid textures. Oral deficits likely secondary to lingual weakness/incoordination. Pharyngeal phase notable for swallow  initiation at the level of the vallecula/pyriform sinuses, moderate-severe vallecular stasis and mild pyriform sinus stasis (with solid and pureed; increased residual with solid), transient during the swallow penetration with thin liquids via cup sip, transient during the swallow aspiration of thin liquids via cup when used as liquid wash, and audible during the swallow aspiration of thin liquids via straw sip. Pharyngeal deficit secondary to: decreased base of tongue retraction, mistimed/incomplete laryngeal vestibule closure, and reduced laryngeal elevation. Liquid wash was effective in reducing residual pureed, less effective with solid. Recommend pureed diet with thin liquids with safe swallowing strategies/aspiration precautions as outlined below. Pt shown videofluoroscopic imaging following MBSS for education. Pt also made aware of results, recommendations, and SLP POC. Pt nodding in agreement, ?full understanding given aphasia. SLP to f/u with family.     10/09/2021   9:33 AM Treatment Recommendations Treatment Recommendations Therapy as outlined in treatment plan below     10/06/2021   3:00 PM Prognosis Prognosis for Safe Diet Advancement Good Barriers to Reach Goals Language deficits;Severity of deficits;Time post onset;Behavior Barriers/Prognosis Comment good Family support   10/09/2021   9:33 AM Diet Recommendations SLP Diet Recommendations Dysphagia 1 (Puree) solids;Thin liquid Liquid Administration via No straw Medication Administration Whole meds with puree Compensations Slow rate;Small sips/bites;Minimize environmental distractions;Lingual sweep for clearance of pocketing Postural Changes Remain semi-upright after after feeds/meals (Comment);Seated upright at 90 degrees     10/09/2021   9:33 AM Other Recommendations Oral Care Recommendations Oral care QID;Staff/trained caregiver to provide oral care Follow Up Recommendations Acute inpatient rehab (3hours/day) Assistance recommended at discharge Frequent  or constant Supervision/Assistance Functional Status Assessment Patient has had a recent decline in their functional status and demonstrates the ability to make significant improvements in function in a reasonable and predictable amount of time.   10/09/2021   9:33 AM Frequency and Duration  Speech Therapy Frequency (ACUTE ONLY) min 3x week Treatment Duration 2 weeks     10/09/2021   9:27 AM Oral Phase Oral Phase Impaired Oral - Thin Teaspoon WFL Oral - Thin Cup WFL Oral - Thin Straw WFL Oral - Puree Lingual/palatal residue Oral - Regular Impaired mastication;Weak lingual manipulation;Lingual/palatal residue;Delayed oral transit;Piecemeal swallowing    10/09/2021   9:29 AM Pharyngeal Phase Pharyngeal Phase Impaired Pharyngeal- Thin Teaspoon WFL;Delayed swallow initiation-pyriform sinuses Pharyngeal Material does not enter airway Pharyngeal- Thin Cup Delayed swallow initiation-pyriform sinuses;Penetration/Aspiration during swallow Pharyngeal Material enters airway, remains ABOVE vocal cords then ejected out;Material enters airway, passes BELOW cords then ejected out Pharyngeal- Thin Straw Delayed swallow initiation-pyriform sinuses;Penetration/Aspiration during swallow Pharyngeal Material enters airway, passes BELOW cords and not ejected out despite cough attempt by patient Pharyngeal- Puree Delayed swallow initiation-vallecula;Reduced tongue base retraction;Reduced laryngeal elevation;Pharyngeal residue - valleculae;Pharyngeal residue - pyriform Pharyngeal Material does not enter airway Pharyngeal- Regular Delayed swallow initiation-vallecula;Reduced tongue base retraction;Reduced laryngeal elevation;Pharyngeal residue - valleculae;Pharyngeal residue - pyriform Pharyngeal Material does not enter airway    10/09/2021   9:33 AM Cervical Esophageal Phase  Cervical Esophageal Phase [facility] Cherrie Gauze, M.S., [facility] [phone] (ASCOM) Anderson Malta  Ellsworth Lennox 10/09/2021, 9:56 AM                     ECHOCARDIOGRAM COMPLETE  Result Date: 10/06/2021    ECHOCARDIOGRAM REPORT   Patient Name:   VADHIR MCNAY [facility] Date of Exam: 10/06/2021 Medical Rec #:  [id number]        Height:       67.0 in Accession #:    [id number]       Weight:       176.0 lb Date of Birth:  1946/01/12         BSA:          1.915 m Patient Age:    65 years         BP:           150/105 mmHg Patient Gender: M                HR:           94 bpm. Exam Location:  [facility] Procedure: 2D Echo, Cardiac Doppler and Color Doppler Indications:     TIA G45.9                  Stroke I63.9  History:         Patient has no prior history of Echocardiogram examinations.  Sonographer:     Sherrie Sport Referring Phys:  [id number] AMY N COX Diagnosing Phys: Ida Rogue MD  Sonographer Comments: Suboptimal apical window. IMPRESSIONS  1. Left ventricular ejection fraction, by estimation, is 55 to 60%. The left ventricle has normal function. The left ventricle has no regional wall motion abnormalities. Left ventricular diastolic parameters are consistent with Grade I diastolic dysfunction (impaired relaxation).  2. Right ventricular systolic function is normal. The right ventricular size is normal. There is normal pulmonary artery systolic pressure. The estimated right ventricular systolic pressure is 34.2 mmHg.  3. The mitral valve is normal in structure. No evidence of mitral valve regurgitation. No evidence of mitral stenosis.  4. The aortic valve is normal in structure. Aortic valve regurgitation is not visualized. Aortic valve sclerosis is present, with no evidence of aortic valve stenosis.  5. The inferior vena cava is normal in size with greater than 50% respiratory variability, suggesting right atrial pressure of 3 mmHg. FINDINGS  Left Ventricle: Left ventricular ejection fraction, by estimation, is 55 to 60%. The left ventricle has normal function. The left ventricle has no regional wall motion abnormalities. The left  ventricular internal cavity size was normal in size. There is  no left ventricular hypertrophy. Left ventricular diastolic parameters are consistent with Grade I diastolic dysfunction (impaired relaxation). Right Ventricle: The right ventricular size is normal. No increase in right ventricular wall thickness. Right ventricular systolic function is normal. There is normal pulmonary artery systolic pressure. The tricuspid regurgitant velocity is 1.56 m/s, and  with an assumed right atrial pressure of 5 mmHg, the estimated right ventricular systolic pressure is 87.6 mmHg. Left Atrium: Left atrial size was normal in size. Right Atrium: Right atrial size was normal in size. Pericardium: There is no evidence of pericardial effusion. Mitral Valve: The mitral valve is normal in structure. No evidence of mitral valve regurgitation. No evidence of mitral valve stenosis. Tricuspid Valve: The tricuspid valve is normal in structure. Tricuspid valve regurgitation is not demonstrated. No evidence of tricuspid stenosis. Aortic Valve: The aortic valve is normal in structure. Aortic valve regurgitation is not visualized. Aortic valve sclerosis is  present, with no evidence of aortic valve stenosis. Aortic valve mean gradient measures 1.5 mmHg. Aortic valve peak gradient measures 1.9 mmHg. Aortic valve area, by VTI measures 4.26 cm. Pulmonic Valve: The pulmonic valve was normal in structure. Pulmonic valve regurgitation is not visualized. No evidence of pulmonic stenosis. Aorta: The aortic root is normal in size and structure. Venous: The inferior vena cava is normal in size with greater than 50% respiratory variability, suggesting right atrial pressure of 3 mmHg. IAS/Shunts: No atrial level shunt detected by color flow Doppler.  LEFT VENTRICLE PLAX 2D LVOT diam:     2.10 cm   Diastology LV SV:         44        LV e' medial:    9.46 cm/s LV SV Index:   23        LV E/e' medial:  5.8 LVOT Area:     3.46 cm  LV e' lateral:   9.68 cm/s                           LV E/e' lateral: 5.7  RIGHT VENTRICLE RV S prime:     12.80 cm/s TAPSE (M-mode): 2.2 cm LEFT ATRIUM           Index LA diam:      2.90 cm 1.51 cm/m LA Vol (A4C): 38.4 ml 20.05 ml/m  AORTIC VALVE AV Area (Vmax):    3.32 cm AV Area (Vmean):   3.14 cm AV Area (VTI):     4.26 cm AV Vmax:           68.85 cm/s AV Vmean:          49.750 cm/s AV VTI:            0.102 m AV Peak Grad:      1.9 mmHg AV Mean Grad:      1.5 mmHg LVOT Vmax:         65.90 cm/s LVOT Vmean:        45.100 cm/s LVOT VTI:          0.126 m LVOT/AV VTI ratio: 1.23  AORTA Ao Root diam: 3.13 cm MITRAL VALVE               TRICUSPID VALVE MV Area (PHT): 4.15 cm    TR Peak grad:   9.7 mmHg MV Decel Time: 183 msec    TR Vmax:        156.00 cm/s MV E velocity: 55.30 cm/s MV A velocity: 87.80 cm/s  SHUNTS MV E/A ratio:  0.63        Systemic VTI:  0.13 m                            Systemic Diam: 2.10 cm Ida Rogue MD Electronically signed by Ida Rogue MD Signature Date/Time: 10/06/2021/5:23:05 PM    Final    MR BRAIN WO CONTRAST  Addendum Date: 10/06/2021   ADDENDUM REPORT: 10/06/2021 05:46 ADDENDUM: Case discussed with Dr. Sidney Ace by telephone at 12:15 a.m. on 10/05/2021. Electronically Signed   By: Jeannine Boga M.D.   On: 10/06/2021 05:46   Result Date: 10/06/2021 CLINICAL DATA:  Initial evaluation for acute speech difficulty. EXAM: MRI HEAD WITHOUT CONTRAST MRA HEAD WITHOUT CONTRAST MRA NECK WITHOUT CONTRAST TECHNIQUE: Multiplanar, multiecho pulse sequences of the brain and surrounding structures were obtained without intravenous contrast. Angiographic images of  the Circle of Willis were obtained using MRA technique without intravenous contrast. Angiographic images of the neck were obtained using MRA technique without intravenous contrast. Carotid stenosis measurements (when applicable) are obtained utilizing NASCET criteria, using the distal internal carotid diameter as the denominator. COMPARISON:  Prior  ultrasound from 08/26/2013 and CT from 10/05/2021. FINDINGS: MRI HEAD FINDINGS Brain: Generalized age-related cerebral atrophy. Scattered patchy and hazy T2/FLAIR hyperintensity involving the periventricular deep white matter both cerebral hemispheres, most consistent with chronic small vessel ischemic disease, mild in nature. Scattered patchy and confluent restricted diffusion involving the left frontal lobe, with involvement of the left basal ganglia and insula, consistent with acute left MCA distribution infarct. Mild patchy involvement of the anterior mesial left temporal lobe noted as well. No associated hemorrhage or significant regional mass effect. No other evidence for acute or subacute ischemia. Gray-white matter differentiation otherwise maintained. Tiny remote left ACA territory infarct noted at the mesial left frontal lobe. No other areas of chronic cortical infarction. No acute or chronic intracranial blood products. No mass lesion, midline shift or mass effect. No hydrocephalus or extra-axial fluid collection. Pituitary gland suprasellar region within normal limits. Vascular: Abnormal flow void throughout the visualized left ICA to the terminus, consistent with slow flow and/or occlusion. Susceptibility artifact within proximal left MCA branches at the left sylvian fissure, consistent with intraluminal thrombus (series 13, image 26). Remainder of the intracranial vascular flow voids are maintained. Skull and upper cervical spine: Craniocervical junction within normal limits. Bone marrow signal intensity normal. No scalp soft tissue abnormality. Sinuses/Orbits: Globes and orbital soft tissues within normal limits. Mild scattered mucosal thickening noted about the right maxillary sinus. Paranasal sinuses are otherwise clear. Trace left mastoid effusion noted, of doubtful significance. Other: None. MRA HEAD FINDINGS ANTERIOR CIRCULATION: Right ICA widely patent to the terminus without stenosis or other  abnormality. Intermittent attenuated flow seen within the visualized mid cervical left ICA and cervical petrous junction. Otherwise, the left ICA is largely occluded to the terminus. Right A1 segment patent. Normal anterior communicating artery complex. Both ACAs remain patent and well perfused. Left A1 not well seen, and could be occluded. Scant attenuated flow seen within the left M1 segment proximally. No significant flow seen within the left MCA branches distally. Findings presumably acute given the presence of the acute infarct. POSTERIOR CIRCULATION: Both vertebral arteries patent to the vertebrobasilar junction without stenosis. Left vertebral artery dominant. Both PICA patent. Basilar patent to its distal aspect without stenosis. Superior cerebellar arteries patent bilaterally. Both PCAs primarily supplied via the basilar well perfused or distal aspects. No visible aneurysm. MRA NECK FINDINGS AORTIC ARCH: Partially visualized aortic arch normal in caliber with standard 3 vessel morphology. No visible stenosis about the origin the great vessels. RIGHT CAROTID SYSTEM: Right CCA patent without stenosis. Atheromatous change about the right carotid bulb/proximal right ICA with associated stenosis of up to 50% by NASCET criteria. Right ICA patent distally without stenosis or evidence for dissection. LEFT CAROTID SYSTEM: Left CCA patent to the bifurcation. 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 1.3 cm flow gap is present. Some attenuated flow seen distally within the cervical left ICA, which then largely reoccludes at the skull base. VERTEBRAL ARTERIES: Both vertebral arteries arise from the subclavian arteries. Neither vertebral artery origin well assessed on this exam. Visualized portions of the vertebral arteries patent without stenosis or evidence for dissection. IMPRESSION: MRI HEAD IMPRESSION: 1. Moderate sized acute ischemic left MCA distribution infarct  as above. No  associated hemorrhage or significant regional mass effect. 2. Loss of normal flow voids throughout the left ICA and MCA, consistent with slow flow and/or occlusion. 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 3. Underlying mild chronic microvascular ischemic disease. Tiny remote left ACA distribution infarct. MRA HEAD IMPRESSION: 1. 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 Left A1 segment not well seen either, also likely occluded. 2. Otherwise wide patency of the major intracranial arterial circulation. No other hemodynamically significant or correctable stenosis. MRA NECK IMPRESSION: 1. 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 Some attenuated flow seen distally within the cervical left ICA, with subsequent reocclusion by the skull base. 2. 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 Otherwise wide patency of the right carotid artery system. 3. Wide patency of the vertebral arteries within the neck. The clinician taking care of this patient has been paged regarding these results, currently awaiting a call back. Results will be conveyed as soon as possible. Electronically Signed: By: Jeannine Boga M.D. On: 10/06/2021 00:07   MR ANGIO HEAD WO CONTRAST  Addendum Date: 10/06/2021   ADDENDUM REPORT: 10/06/2021 05:46 ADDENDUM: Case discussed with Dr. Sidney Ace by telephone at 12:15 a.m. on 10/05/2021. Electronically Signed   By: Jeannine Boga M.D.   On: 10/06/2021 05:46   Result Date: 10/06/2021 CLINICAL DATA:  Initial evaluation for acute speech difficulty. EXAM: MRI HEAD WITHOUT CONTRAST MRA HEAD WITHOUT CONTRAST MRA NECK WITHOUT CONTRAST TECHNIQUE: Multiplanar, multiecho pulse sequences of the brain and surrounding structures were obtained without intravenous  contrast. Angiographic images of the Circle of Willis were obtained using MRA technique without intravenous contrast. Angiographic images of the neck were obtained using MRA technique without intravenous contrast. Carotid stenosis measurements (when applicable) are obtained utilizing NASCET criteria, using the distal internal carotid diameter as the denominator. COMPARISON:  Prior ultrasound from 08/26/2013 and CT from 10/05/2021. FINDINGS: MRI HEAD FINDINGS Brain: Generalized age-related cerebral atrophy. Scattered patchy and hazy T2/FLAIR hyperintensity involving the periventricular deep white matter both cerebral hemispheres, most consistent with chronic small vessel ischemic disease, mild in nature. Scattered patchy and confluent restricted diffusion involving the left frontal lobe, with involvement of the left basal ganglia and insula, consistent with acute left MCA distribution infarct. Mild patchy involvement of the anterior mesial left temporal lobe noted as well. No associated hemorrhage or significant regional mass effect. No other evidence for acute or subacute ischemia. Gray-white matter differentiation otherwise maintained. Tiny remote left ACA territory infarct noted at the mesial left frontal lobe. No other areas of chronic cortical infarction. No acute or chronic intracranial blood products. No mass lesion, midline shift or mass effect. No hydrocephalus or extra-axial fluid collection. Pituitary gland suprasellar region within normal limits. Vascular: Abnormal flow void throughout the visualized left ICA to the terminus, consistent with slow flow and/or occlusion. Susceptibility artifact within proximal left MCA branches at the left sylvian fissure, consistent with intraluminal thrombus (series 13, image 26). Remainder of the intracranial vascular flow voids are maintained. Skull and upper cervical spine: Craniocervical junction within normal limits. Bone marrow signal intensity normal. No scalp soft  tissue abnormality. Sinuses/Orbits: Globes and orbital soft tissues within normal limits. Mild scattered mucosal thickening noted about the right maxillary sinus. Paranasal sinuses are otherwise clear. Trace left mastoid effusion noted,  of doubtful significance. Other: None. MRA HEAD FINDINGS ANTERIOR CIRCULATION: Right ICA widely patent to the terminus without stenosis or other abnormality. Intermittent attenuated flow seen within the visualized mid cervical left ICA and cervical petrous junction. Otherwise, the left ICA is largely occluded to the terminus. Right A1 segment patent. Normal anterior communicating artery complex. Both ACAs remain patent and well perfused. Left A1 not well seen, and could be occluded. Scant attenuated flow seen within the left M1 segment proximally. No significant flow seen within the left MCA branches distally. Findings presumably acute given the presence of the acute infarct. POSTERIOR CIRCULATION: Both vertebral arteries patent to the vertebrobasilar junction without stenosis. Left vertebral artery dominant. Both PICA patent. Basilar patent to its distal aspect without stenosis. Superior cerebellar arteries patent bilaterally. Both PCAs primarily supplied via the basilar well perfused or distal aspects. No visible aneurysm. MRA NECK FINDINGS AORTIC ARCH: Partially visualized aortic arch normal in caliber with standard 3 vessel morphology. No visible stenosis about the origin the great vessels. RIGHT CAROTID SYSTEM: Right CCA patent without stenosis. Atheromatous change about the right carotid bulb/proximal right ICA with associated stenosis of up to 50% by NASCET criteria. Right ICA patent distally without stenosis or evidence for dissection. LEFT CAROTID SYSTEM: Left CCA patent to the bifurcation. 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 1.3 cm flow gap is present. Some attenuated flow seen distally within the cervical left ICA, which then  largely reoccludes at the skull base. VERTEBRAL ARTERIES: Both vertebral arteries arise from the subclavian arteries. Neither vertebral artery origin well assessed on this exam. Visualized portions of the vertebral arteries patent without stenosis or evidence for dissection. IMPRESSION: MRI HEAD IMPRESSION: 1. 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 No associated hemorrhage or significant regional mass effect. 2. Loss of normal flow voids throughout the left ICA and MCA, consistent with slow flow and/or occlusion. 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 3. Underlying mild chronic microvascular ischemic disease. Tiny remote left ACA distribution infarct. MRA HEAD IMPRESSION: 1. 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 Left A1 segment not well seen either, also likely occluded. 2. Otherwise wide patency of the major intracranial arterial circulation. No other hemodynamically significant or correctable stenosis. MRA NECK IMPRESSION: 1. 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 Some attenuated flow seen distally within the cervical left ICA, with subsequent reocclusion by the skull base. 2. 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 Otherwise wide patency of the right carotid artery system. 3. Wide patency of the vertebral arteries within the neck. The clinician taking care of this patient has been paged regarding these results, currently awaiting a call back. Results will be conveyed as soon as possible. Electronically Signed: By: Jeannine Boga M.D. On: 10/06/2021 00:07   MR ANGIO NECK W WO CONTRAST  Addendum Date: 10/06/2021   ADDENDUM REPORT: 10/06/2021 05:46 ADDENDUM: Case discussed with Dr. Sidney Ace by telephone at 12:15 a.m. on  10/05/2021. Electronically Signed   By: Jeannine Boga M.D.   On: 10/06/2021 05:46   Result Date: 10/06/2021 CLINICAL DATA:  Initial evaluation for acute speech difficulty. EXAM: MRI HEAD WITHOUT CONTRAST MRA HEAD WITHOUT CONTRAST MRA NECK WITHOUT CONTRAST TECHNIQUE: Multiplanar, multiecho pulse sequences of the brain and  surrounding structures were obtained without intravenous contrast. Angiographic images of the Circle of Willis were obtained using MRA technique without intravenous contrast. Angiographic images of the neck were obtained using MRA technique without intravenous contrast. Carotid stenosis measurements (when applicable) are obtained utilizing NASCET criteria, using the distal internal carotid diameter as the denominator. COMPARISON:  Prior ultrasound from 08/26/2013 and CT from 10/05/2021. FINDINGS: MRI HEAD FINDINGS Brain: Generalized age-related cerebral atrophy. Scattered patchy and hazy T2/FLAIR hyperintensity involving the periventricular deep white matter both cerebral hemispheres, most consistent with chronic small vessel ischemic disease, mild in nature. Scattered patchy and confluent restricted diffusion involving the left frontal lobe, with involvement of the left basal ganglia and insula, consistent with acute left MCA distribution infarct. Mild patchy involvement of the anterior mesial left temporal lobe noted as well. No associated hemorrhage or significant regional mass effect. No other evidence for acute or subacute ischemia. Gray-white matter differentiation otherwise maintained. Tiny remote left ACA territory infarct noted at the mesial left frontal lobe. No other areas of chronic cortical infarction. No acute or chronic intracranial blood products. No mass lesion, midline shift or mass effect. No hydrocephalus or extra-axial fluid collection. Pituitary gland suprasellar region within normal limits. Vascular: Abnormal flow void throughout the visualized left ICA to the  terminus, consistent with slow flow and/or occlusion. Susceptibility artifact within proximal left MCA branches at the left sylvian fissure, consistent with intraluminal thrombus (series 13, image 26). Remainder of the intracranial vascular flow voids are maintained. Skull and upper cervical spine: Craniocervical junction within normal limits. Bone marrow signal intensity normal. No scalp soft tissue abnormality. Sinuses/Orbits: Globes and orbital soft tissues within normal limits. Mild scattered mucosal thickening noted about the right maxillary sinus. Paranasal sinuses are otherwise clear. Trace left mastoid effusion noted, of doubtful significance. Other: None. MRA HEAD FINDINGS ANTERIOR CIRCULATION: Right ICA widely patent to the terminus without stenosis or other abnormality. Intermittent attenuated flow seen within the visualized mid cervical left ICA and cervical petrous junction. Otherwise, the left ICA is largely occluded to the terminus. Right A1 segment patent. Normal anterior communicating artery complex. Both ACAs remain patent and well perfused. Left A1 not well seen, and could be occluded. Scant attenuated flow seen within the left M1 segment proximally. No significant flow seen within the left MCA branches distally. Findings presumably acute given the presence of the acute infarct. POSTERIOR CIRCULATION: Both vertebral arteries patent to the vertebrobasilar junction without stenosis. Left vertebral artery dominant. Both PICA patent. Basilar patent to its distal aspect without stenosis. Superior cerebellar arteries patent bilaterally. Both PCAs primarily supplied via the basilar well perfused or distal aspects. No visible aneurysm. MRA NECK FINDINGS AORTIC ARCH: Partially visualized aortic arch normal in caliber with standard 3 vessel morphology. No visible stenosis about the origin the great vessels. RIGHT CAROTID SYSTEM: Right CCA patent without stenosis. Atheromatous change about the right carotid  bulb/proximal right ICA with associated stenosis of up to 50% by NASCET criteria. Right ICA patent distally without stenosis or evidence for dissection. LEFT CAROTID SYSTEM: Left CCA patent to the bifurcation. 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 1.3 cm flow gap is present. Some attenuated flow seen distally within the cervical left ICA, which then largely reoccludes at the skull base. VERTEBRAL ARTERIES: Both vertebral arteries arise from the subclavian arteries. Neither vertebral artery origin well assessed on this exam. Visualized portions of the vertebral arteries patent without stenosis or evidence for dissection. IMPRESSION: MRI HEAD  IMPRESSION: 1. 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 No associated hemorrhage or significant regional mass effect. 2. Loss of normal flow voids throughout the left ICA and MCA, consistent with slow flow and/or occlusion. 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 3. Underlying mild chronic microvascular ischemic disease. Tiny remote left ACA distribution infarct. MRA HEAD IMPRESSION: 1. 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 Left A1 segment not well seen either, also likely occluded. 2. Otherwise wide patency of the major intracranial arterial circulation. No other hemodynamically significant or correctable stenosis. MRA NECK IMPRESSION: 1. 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 Some attenuated flow seen distally within the cervical left ICA, with subsequent reocclusion by the skull base. 2. 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 Otherwise wide patency of the right carotid artery system. 3. Wide patency of the vertebral arteries within the neck. The clinician  taking care of this patient has been paged regarding these results, currently awaiting a call back. Results will be conveyed as soon as possible. Electronically Signed: By: Jeannine Boga M.D. On: 10/06/2021 00:07   DG Chest Port 1 View  Result Date: 10/05/2021 CLINICAL DATA:  Stroke, TIA EXAM: PORTABLE CHEST 1 VIEW COMPARISON:  07/11/2017 FINDINGS: Heart and mediastinal contours are within normal limits. No focal opacities or effusions. No acute bony abnormality. IMPRESSION: No active disease. Electronically Signed   By: Rolm Baptise M.D.   On: 10/05/2021 17:21   CT HEAD WO CONTRAST  Result Date: 10/05/2021 CLINICAL DATA:  Mental status change, unknown cause EXAM: CT HEAD WITHOUT CONTRAST TECHNIQUE: Contiguous axial images were obtained from the base of the skull through the vertex without intravenous contrast. RADIATION DOSE REDUCTION: This exam was performed according to the departmental dose-optimization program which includes automated exposure control, adjustment of the mA and/or kV according to patient size and/or use of iterative reconstruction technique. COMPARISON:  None Available. FINDINGS: Brain: Mild hypoattenuation in the region of the inferior left frontal lobe and anterior left insula. No acute hemorrhage, mass lesion, midline shift, or hydrocephalus. No visible extra-axial fluid collection. Vascular: No hyperdense vessel identified. Calcific intracranial atherosclerosis. Skull: No acute fracture. Sinuses/Orbits: Clear visualized sinuses. No acute orbital findings. Other: No mastoid effusions. IMPRESSION: Mild hypoattenuation in the region of the inferior left frontal lobe and anterior left insula. This may be artifactual given suspected streak artifact in this region; however, if there is concern for left MCA territory infarct then recommend MRI for more sensitive evaluation. Electronically Signed   By: Margaretha Sheffield M.D.   On: 10/05/2021 15:20    Microbiology: Results for  orders placed or performed during the hospital encounter of 10/05/21  Resp Panel by RT-PCR (Flu A&B, Covid) Anterior Nasal Swab     Status: None   Collection Time: 10/05/21  2:07 PM   Specimen: Anterior Nasal Swab  Result Value Ref Range Status   SARS Coronavirus 2 by RT PCR NEGATIVE NEGATIVE Final    Comment: (NOTE) SARS-CoV-2 target nucleic acids are NOT DETECTED.  The SARS-CoV-2 RNA is generally detectable in upper respiratory specimens during the acute phase of infection. The lowest concentration of SARS-CoV-2 viral copies this assay can detect is 138 copies/mL. A negative result does not preclude SARS-Cov-2 infection and should not be used as the sole  basis for treatment or other patient management decisions. A negative result may occur with  improper specimen collection/handling, submission of specimen other than nasopharyngeal swab, presence of viral mutation(s) within the areas targeted by this assay, and inadequate number of viral copies(<138 copies/mL). A negative result must be combined with clinical observations, patient history, and epidemiological information. The expected result is Negative.  Fact Sheet for Patients:  EntrepreneurPulse.com.au  Fact Sheet for Healthcare Providers:  IncredibleEmployment.be  This test is no t yet approved or cleared by the [place] FDA and  has been authorized for detection and/or diagnosis of SARS-CoV-2 by FDA under an Emergency Use Authorization (EUA). This EUA will remain  in effect (meaning this test can be used) for the duration of the COVID-19 declaration under Section 564(b)(1) of the Act, 21 U.S.C.section 360bbb-3(b)(1), unless the authorization is terminated  or revoked sooner.       Influenza A by PCR NEGATIVE NEGATIVE Final   Influenza B by PCR NEGATIVE NEGATIVE Final    Comment: (NOTE) The Xpert Xpress SARS-CoV-2/FLU/RSV plus assay is intended as an aid in the diagnosis of  influenza from Nasopharyngeal swab specimens and should not be used as a sole basis for treatment. Nasal washings and aspirates are unacceptable for Xpert Xpress SARS-CoV-2/FLU/RSV testing.  Fact Sheet for Patients: EntrepreneurPulse.com.au  Fact Sheet for Healthcare Providers: IncredibleEmployment.be  This test is not yet approved or cleared by the [place] FDA and has been authorized for detection and/or diagnosis of SARS-CoV-2 by FDA under an Emergency Use Authorization (EUA). This EUA will remain in effect (meaning this test can be used) for the duration of the COVID-19 declaration under Section 564(b)(1) of the Act, 21 U.S.C. section 360bbb-3(b)(1), unless the authorization is terminated or revoked.  Performed at [facility], [address] [place], [place] [address]     Labs: CBC: Recent Labs  Lab 10/05/21 1407  WBC 7.9  NEUTROABS 4.7  HGB 14.4  HCT 43.0  MCV 90.0  PLT 938   Basic Metabolic Panel: Recent Labs  Lab 10/05/21 1407  NA 137  K 3.8  CL 106  CO2 23  GLUCOSE 119*  BUN 12  CREATININE 0.90  CALCIUM 8.3*   Liver Function Tests: Recent Labs  Lab 10/05/21 1407  AST 19  ALT 16  ALKPHOS 47  BILITOT 0.5  PROT 6.5  ALBUMIN 3.8   CBG: Recent Labs  Lab 10/08/21 1228 10/08/21 1632 10/08/21 1951 10/09/21 0919 10/09/21 1144  GLUCAP 238* 198* 264* 188* 172*    Discharge time spent: less than 30 minutes.  Signed: Vanna Scotland, MD [facility] 10/09/2021

## 2021-10-09 NOTE — Procedures (Addendum)
Objective Swallowing Evaluation: Type of Study: MBS-Modified Barium Swallow Study   Patient Details  Name: Gerald Bauer MRN: [id number] Date of Birth: [birth year]  Today's Date: 10/09/2021 Time: SLP Start Time (ACUTE ONLY): 0815 -SLP Stop Time (ACUTE ONLY): 0840  SLP Time Calculation (min) (ACUTE ONLY): 25 min   Past Medical History:  Past Medical History:  Diagnosis Date   Acid reflux    Arthritis    hands   Carotid atherosclerosis    Diabetes mellitus without complication (HCC)    Hyperlipemia    Hypertension    Prostate cancer ([place])    history   Tachycardia    Past Surgical History:  Past Surgical History:  Procedure Laterality Date   APPENDECTOMY  1973   CHOLECYSTECTOMY  2013   PROSTATECTOMY  2012   HPI: Pt  is a 76 year old male with history of left-sided carotid stenosis, non-insulin-dependent diabetes mellitus type 2, PAD, GERD, mild COPD, history of tobacco use, hyperlipidemia, hypertension, who presents emergency department for chief concerns of facial droop, expressive aphasia, and R sided weakness.  MRI/MRA Head and Neck Imaging: MRI HEAD IMPRESSION:     1. 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 No associated hemorrhage or significant regional mass effect.  2. Loss of normal flow voids throughout the left ICA and MCA,  consistent with slow flow and/or occlusion. Susceptibility artifact  within proximal left MCA branches consistent with intraluminal  thrombus.  3. Underlying mild chronic microvascular ischemic disease. Tiny  remote left ACA distribution infarct.     MRA HEAD IMPRESSION:   "1. Interval near complete occlusion of the left ICA and MCA,  presumably acute in nature given the presence of the acute left MCA  territory infarct. Left A1 segment not well seen either, also likely  occluded.  2. Otherwise wide patency of the major intracranial arterial  circulation. No other hemodynamically significant or correctable  stenosis.     MRA  NECK IMPRESSION:  1. 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 Some attenuated flow seen  distally within the cervical left ICA, with subsequent reocclusion  by the skull base.  2. 50% atheromatous stenosis at the origin of the cervical right  ICA. Otherwise wide patency of the right carotid artery system.  3. Wide patency of the vertebral arteries within the neck.".   Subjective: Pt alert, pleasant, and cooperative. Mainly non-verbal with exception of a few automatic utterances. Communicating via gestural communication and facial expression.    Recommendations for follow up therapy are one component of a multi-disciplinary discharge planning process, led by the attending physician.  Recommendations may be updated based on patient status, additional functional criteria and insurance authorization.  Assessment / Plan / Recommendation     10/09/2021    9:45 AM  Clinical Impressions  Clinical Impression Pt seen for MBSS. Pt alert, pleasant, and cooperative. Mainly non-verbal with exception of a few automatic utterances. Communicating via gestural communication and facial expressions.   Pt presents with a moderate oropharyngeal dysphagia. Oral phase notable for prolonged inefficient mastication of solids and mild oral residual with pureed and solid textures. Oral deficits likely secondary to lingual weakness/incoordination. Pharyngeal phase notable for swallow initiation at the level of the vallecula/pyriform sinuses, moderate-severe vallecular stasis and mild pyriform sinus stasis (with solid and pureed; increased residual with solid), transient during the swallow penetration with thin liquids via cup sip, transient during the swallow aspiration  of thin liquids via cup when used as liquid wash, and audible during the swallow aspiration of thin liquids via straw sip. Pharyngeal deficit secondary to: decreased base of tongue retraction,  mistimed/incomplete laryngeal vestibule closure, and reduced laryngeal elevation. Liquid wash was effective in reducing residual pureed, less effective with solid.  Recommend pureed diet with thin liquids with safe swallowing strategies/aspiration precautions as outlined below.   Pt shown videofluoroscopic imaging following MBSS for education. Pt also made aware of results, recommendations, and SLP POC. Pt nodding in agreement, ?full understanding given aphasia. SLP to f/u with family.         10/09/2021    9:33 AM  Treatment Recommendations  Treatment Recommendations Therapy as outlined in treatment plan below        10/06/2021    3:00 PM  Prognosis  Prognosis for Safe Diet Advancement Good  Barriers to Reach Goals Language deficits;Severity of deficits;Time post onset;Behavior  Barriers/Prognosis Comment good Family support       10/09/2021    9:33 AM  Diet Recommendations  SLP Diet Recommendations Dysphagia 1 (Puree) solids;Thin liquid  Liquid Administration via No straw  Medication Administration Whole meds with puree  Compensations Slow rate;Small sips/bites;Minimize environmental distractions;Lingual sweep for clearance of pocketing  Postural Changes Remain semi-upright after after feeds/meals (Comment);Seated upright at 90 degrees         10/09/2021    9:33 AM  Other Recommendations  Oral Care Recommendations Oral care QID;Staff/trained caregiver to provide oral care  Follow Up Recommendations Acute inpatient rehab (3hours/day)  Assistance recommended at discharge Frequent or constant Supervision/Assistance  Functional Status Assessment Patient has had a recent decline in their functional status and demonstrates the ability to make significant improvements in function in a reasonable and predictable amount of time.       10/09/2021    9:33 AM  Frequency and Duration   Speech Therapy Frequency (ACUTE ONLY) min 3x week  Treatment Duration 2 weeks         10/09/2021     9:27 AM  Oral Phase  Oral Phase Impaired  Oral - Thin Teaspoon WFL  Oral - Thin Cup WFL  Oral - Thin Straw WFL  Oral - Puree Lingual/palatal residue  Oral - Regular Impaired mastication;Weak lingual manipulation;Lingual/palatal residue;Delayed oral transit;Piecemeal swallowing       10/09/2021    9:29 AM  Pharyngeal Phase  Pharyngeal Phase Impaired  Pharyngeal- Thin Teaspoon WFL;Delayed swallow initiation-pyriform sinuses  Pharyngeal Material does not enter airway  Pharyngeal- Thin Cup Delayed swallow initiation-pyriform sinuses;Penetration/Aspiration during swallow  Pharyngeal Material enters airway, remains ABOVE vocal cords then ejected out;Material enters airway, passes BELOW cords then ejected out  Pharyngeal- Thin Straw Delayed swallow initiation-pyriform sinuses;Penetration/Aspiration during swallow  Pharyngeal Material enters airway, passes BELOW cords and not ejected out despite cough attempt by patient  Pharyngeal- Puree Delayed swallow initiation-vallecula;Reduced tongue base retraction;Reduced laryngeal elevation;Pharyngeal residue - valleculae;Pharyngeal residue - pyriform  Pharyngeal Material does not enter airway  Pharyngeal- Regular Delayed swallow initiation-vallecula;Reduced tongue base retraction;Reduced laryngeal elevation;Pharyngeal residue - valleculae;Pharyngeal residue - pyriform  Pharyngeal Material does not enter airway        10/09/2021    9:33 AM  Cervical Esophageal Phase   Cervical Esophageal Phase [facility]    Cherrie Gauze, M.S., [facility] [phone] [place] Denis)   Quintella Baton 10/09/2021, 9:56 AM

## 2021-10-09 NOTE — Progress Notes (Signed)
Inpatient Rehabilitation Admission Medication Review by a Pharmacist  A complete drug regimen review was completed for this patient to identify any potential clinically significant medication issues.  High Risk Drug Classes Is patient taking? Indication by Medication  Antipsychotic No   Anticoagulant Yes Lovenox- VTE prophylaxis  Antibiotic No   Opioid No   Antiplatelet Yes Aspirin, plavix- CVA prophylaxis  Hypoglycemics/insulin Yes Insulin- T2DM  Vasoactive Medication Yes Norvasc- HTN  Chemotherapy No   Other Yes Vitamin D, Vitamin B-12- supplement     Type of Medication Issue Identified Description of Issue Recommendation(s)  Drug Interaction(s) (clinically significant)     Duplicate Therapy     Allergy     No Medication Administration End Date     Incorrect Dose     Additional Drug Therapy Needed     Significant med changes from prior encounter (inform family/care partners about these prior to discharge).    Other  PTA meds: Metformin Albuterol inh Restart PTA meds when and if necessary during CIR admission or at time of discharge, if warranted    Clinically significant medication issues were identified that warrant physician communication and completion of prescribed/recommended actions by midnight of the next day:  No   Time spent performing this drug regimen review (minutes):  30   Baruc Tugwell BS, PharmD, BCPS Clinical Pharmacist 10/09/2021 3:37 PM  Contact: [phone] after 3 PM  "Be curious, not judgmental..." -Jamal Maes

## 2021-10-09 NOTE — Progress Notes (Addendum)
PROGRESS NOTE    Gerald Bauer  LAG:[id number] DOB: [birth year] DOA: 10/05/2021 PCP: Gerald Grana, PA-C    Brief Narrative:  76 year old gentleman with history of carotid stenosis, type 2 diabetes on oral hypoglycemics, peripheral artery disease, GERD, hyperlipidemia and hypertension presents to the ER with left-sided facial droop and unable to talk.  In the emergency room on room air.  Blood pressure stable.  Infection work-up negative.  CT head with hypoattenuation left frontal lobe and anterior left insula hypoattenuation.  Admitted with acute stroke. 7/15- appears MRD- plan for CIR if approved, awaiting dispo 7/16- BP improved, qualifies for CIR, awaiting disposition 7/17- Bp wnl range, progressing awaiting Rehab admission.  Assessment & Plan:  Acute ischemic stroke: Clinical findings- (expressive aphasia, right hemiparesis.) CT head findings, hypoattenuation left frontal lobe and left insula. MRI of the brain, medium sized ischemic infarction left MCA territory. MRA of the head and neck, acute to subacute occlusion of the distal left ICA and MCA. 2D echocardiogram, pending. Antiplatelet therapy, on aspirin at home.  Neurology recommended to continue aspirin and Plavix indefinitely. LDL 44.  On Repatha.  On goal. Hemoglobin A1c, 7.1.  At goal.  Currently on sliding scale insulin.  On oral hypoglycemics that he can resume on discharge. PT/OT and neurology follow-up.  Speech swallow evaluation and cognition. Will benefit with acute inpatient rehab.  Type 2 diabetes, well controlled on metformin: Currently on SSI.  Hemoglobin A1c 7.1.  Resume metformin on discharge.  Essential hypertension: Blood pressures fairly stable.  Allow permissive hypertension.  Hyperlipidemia associated with type 2 diabetes: Intolerant to statin.  Continue Repatha.   DVT prophylaxis: enoxaparin (LOVENOX) injection 40 mg Start: 10/06/21 1000 SCD's Start: 10/05/21 1643   Code Status: Full code Family  Communication: Son, daughter and daughter-in-law at the bedside Disposition Plan: Status is: Inpatient Remains inpatient appropriate because: Acute a stroke.  Inpatient work-up.     Consultants:  Neurology  Procedures:  None  Antimicrobials:  None   Subjective: Still weak, difficulty speaking. Following commands, working with therapy.   Objective: Vitals:   10/09/21 0041 10/09/21 0556 10/09/21 0922 10/09/21 1147  BP: (!) 143/88 (!) 138/92 126/80 (!) 127/93  Pulse: 84 80 88 92  Resp: '18 16 20 20  '$ Temp: 97.9 F (36.6 C) 98 F (36.7 C) 99.1 F (37.3 C) 98.2 F (36.8 C)  TempSrc:      SpO2: 96% 97% 98% 98%  Weight:      Height:        Intake/Output Summary (Last 24 hours) at 10/09/2021 1202 Last data filed at 10/08/2021 2200 Gross per 24 hour  Intake 120 ml  Output 550 ml  Net -430 ml    Filed Weights   10/05/21 1405  Weight: 79.8 kg    Examination:  General exam: Appears calm and comfortable  Respiratory system: Clear to auscultation. Respiratory effort normal. Cardiovascular system: S1 & S2 heard, RRR. No JVD, murmurs, rubs, gallops or clicks. No pedal edema. Gastrointestinal system: Abdomen is nondistended, soft and nontender. No organomegaly or masses felt. Normal bowel sounds heard. Central nervous system: Alert and oriented.  Right ptosis Note [name] has Prominent right facial droop Right upper extremity 0/5 proximal group, 2/5 distal group. Right lower extremity 2/5 proximal, 3/5 distal Left upper and lower extremity normal strength.   Data Reviewed: I have personally reviewed following labs and imaging studies  CBC: Recent Labs  Lab 10/05/21 1407  WBC 7.9  NEUTROABS 4.7  HGB 14.4  HCT 43.0  MCV 90.0  PLT 017    Basic Metabolic Panel: Recent Labs  Lab 10/05/21 1407  NA 137  K 3.8  CL 106  CO2 23  GLUCOSE 119*  BUN 12  CREATININE 0.90  CALCIUM 8.3*    GFR: Estimated Creatinine Clearance: 69.6 mL/min (by C-G formula based on SCr  of 0.9 mg/dL). Liver Function Tests: Recent Labs  Lab 10/05/21 1407  AST 19  ALT 16  ALKPHOS 47  BILITOT 0.5  PROT 6.5  ALBUMIN 3.8    No results for input(s): "LIPASE", "AMYLASE" in the last 168 hours. No results for input(s): "AMMONIA" in the last 168 hours. Coagulation Profile: Recent Labs  Lab 10/05/21 1407  INR 1.1    Cardiac Enzymes: No results for input(s): "CKTOTAL", "CKMB", "CKMBINDEX", "TROPONINI" in the last 168 hours. BNP (last 3 results) No results for input(s): "PROBNP" in the last 8760 hours. HbA1C: No results for input(s): "HGBA1C" in the last 72 hours.  CBG: Recent Labs  Lab 10/08/21 1228 10/08/21 1632 10/08/21 1951 10/09/21 0919 10/09/21 1144  GLUCAP 238* 198* 264* 188* 172*    Lipid Profile: No results for input(s): "CHOL", "HDL", "LDLCALC", "TRIG", "CHOLHDL", "LDLDIRECT" in the last 72 hours.  Thyroid Function Tests: No results for input(s): "TSH", "T4TOTAL", "FREET4", "T3FREE", "THYROIDAB" in the last 72 hours. Anemia Panel: No results for input(s): "VITAMINB12", "FOLATE", "FERRITIN", "TIBC", "IRON", "RETICCTPCT" in the last 72 hours. Sepsis Labs: No results for input(s): "PROCALCITON", "LATICACIDVEN" in the last 168 hours.  Recent Results (from the past 240 hour(s))  Resp Panel by RT-PCR (Flu A&B, Covid) Anterior Nasal Swab     Status: None   Collection Time: 10/05/21  2:07 PM   Specimen: Anterior Nasal Swab  Result Value Ref Range Status   SARS Coronavirus 2 by RT PCR NEGATIVE NEGATIVE Final    Comment: (NOTE) SARS-CoV-2 target nucleic acids are NOT DETECTED.  The SARS-CoV-2 RNA is generally detectable in upper respiratory specimens during the acute phase of infection. The lowest concentration of SARS-CoV-2 viral copies this assay can detect is 138 copies/mL. A negative result does not preclude SARS-Cov-2 infection and should not be used as the sole basis for treatment or other patient management decisions. A negative result may  occur with  improper specimen collection/handling, submission of specimen other than nasopharyngeal swab, presence of viral mutation(s) within the areas targeted by this assay, and inadequate number of viral copies(<138 copies/mL). A negative result must be combined with clinical observations, patient history, and epidemiological information. The expected result is Negative.  Fact Sheet for Patients:  EntrepreneurPulse.com.au  Fact Sheet for Healthcare Providers:  IncredibleEmployment.be  This test is no t yet approved or cleared by the [place] FDA and  has been authorized for detection and/or diagnosis of SARS-CoV-2 by FDA under an Emergency Use Authorization (EUA). This EUA will remain  in effect (meaning this test can be used) for the duration of the COVID-19 declaration under Section 564(b)(1) of the Act, 21 U.S.C.section 360bbb-3(b)(1), unless the authorization is terminated  or revoked sooner.       Influenza A by PCR NEGATIVE NEGATIVE Final   Influenza B by PCR NEGATIVE NEGATIVE Final    Comment: (NOTE) The Xpert Xpress SARS-CoV-2/FLU/RSV plus assay is intended as an aid in the diagnosis of influenza from Nasopharyngeal swab specimens and should not be used as a sole basis for treatment. Nasal washings and aspirates are unacceptable for Xpert Xpress SARS-CoV-2/FLU/RSV testing.  Fact Sheet for Patients: EntrepreneurPulse.com.au  Fact Sheet for  Healthcare Providers: IncredibleEmployment.be  This test is not yet approved or cleared by the [place] and has been authorized for detection and/or diagnosis of SARS-CoV-2 by FDA under an Emergency Use Authorization (EUA). This EUA will remain in effect (meaning this test can be used) for the duration of the COVID-19 declaration under Section 564(b)(1) of the Act, 21 U.S.C. section 360bbb-3(b)(1), unless the authorization is terminated  or revoked.  Performed at [facility], [address] [place], [place] [address]          Radiology Studies: DG Swallowing [facility] Pathology  Result Date: 10/09/2021 [name]     10/09/2021 10:12 AM Objective Swallowing Evaluation: Type of Study: MBS-Modified Barium Swallow Study  Patient Details Name: Gerald Bauer MRN: [id number] Date of Birth: 02-25-1946 Today's Date: 10/09/2021 Time: SLP Start Time (ACUTE ONLY): 0815 -SLP Stop Time (ACUTE ONLY): 0840 SLP Time Calculation (min) (ACUTE ONLY): 25 min Past Medical History: Past Medical History: Diagnosis Date  Acid reflux   Arthritis   hands  Carotid atherosclerosis   Diabetes mellitus without complication ([place])   Hyperlipemia   Hypertension   Prostate cancer ([place])   history  Tachycardia  Past Surgical History: Past Surgical History: Procedure Laterality Date  APPENDECTOMY  1973  CHOLECYSTECTOMY  2013  PROSTATECTOMY  2012 HPI: Pt  is a 76 year old male with history of left-sided carotid stenosis, non-insulin-dependent diabetes mellitus type 2, PAD, GERD, mild COPD, history of tobacco use, hyperlipidemia, hypertension, who presents emergency department for chief concerns of facial droop, expressive aphasia, and R sided weakness.  MRI/MRA Head and Neck Imaging: MRI HEAD IMPRESSION:     1. 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 No associated hemorrhage or significant regional mass effect.  2. Loss of normal flow voids throughout the left ICA and MCA,  consistent with slow flow and/or occlusion. Susceptibility artifact  within proximal left MCA branches consistent with intraluminal  thrombus.  3. Underlying mild chronic microvascular ischemic disease. Tiny  remote left ACA distribution infarct.     MRA HEAD IMPRESSION:   "1. Interval near complete occlusion of the left ICA and MCA,  presumably acute in nature given the presence of the acute left MCA  territory infarct. Left A1 segment not  well seen either, also likely  occluded.  2. Otherwise wide patency of the major intracranial arterial  circulation. No other hemodynamically significant or correctable  stenosis.     MRA NECK IMPRESSION:  1. 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 Some attenuated flow seen  distally within the cervical left ICA, with subsequent reocclusion  by the skull base.  2. 50% atheromatous stenosis at the origin of the cervical right  ICA. Otherwise wide patency of the right carotid artery system.  3. Wide patency of the vertebral arteries within the neck.".  Subjective: Pt alert, pleasant, and cooperative. Mainly non-verbal with exception of a few automatic utterances. Communicating via gestural communication and facial expression.  Recommendations for follow up therapy are one component of a multi-disciplinary discharge planning process, led by the attending physician.  Recommendations may be updated based on patient status, additional functional criteria and insurance authorization. Assessment / Plan / Recommendation   10/09/2021   9:45 AM Clinical Impressions Clinical Impression Pt seen for MBSS. Pt alert, pleasant, and cooperative. Mainly non-verbal with exception of a few automatic utterances. Communicating via gestural communication and facial expressions.  Pt presents with a moderate oropharyngeal dysphagia. Oral phase notable for prolonged inefficient mastication of solids and mild oral residual with pureed and solid textures. Oral deficits likely secondary to lingual weakness/incoordination. Pharyngeal phase notable for swallow initiation at the level of the vallecula/pyriform sinuses, moderate-severe vallecular stasis and mild pyriform sinus stasis (with solid and pureed; increased residual with solid), transient during the swallow penetration with thin liquids via cup sip, transient during the swallow aspiration of thin liquids via cup when used as  liquid wash, and audible during the swallow aspiration of thin liquids via straw sip. Pharyngeal deficit secondary to: decreased base of tongue retraction, mistimed/incomplete laryngeal vestibule closure, and reduced laryngeal elevation. Liquid wash was effective in reducing residual pureed, less effective with solid. Recommend pureed diet with thin liquids with safe swallowing strategies/aspiration precautions as outlined below. Pt shown videofluoroscopic imaging following MBSS for education. Pt also made aware of results, recommendations, and SLP POC. Pt nodding in agreement, ?full understanding given aphasia. SLP to f/u with family.     10/09/2021   9:33 AM Treatment Recommendations Treatment Recommendations Therapy as outlined in treatment plan below     10/06/2021   3:00 PM Prognosis Prognosis for Safe Diet Advancement Good Barriers to Reach Goals Language deficits;Severity of deficits;Time post onset;Behavior Barriers/Prognosis Comment good Family support   10/09/2021   9:33 AM Diet Recommendations SLP Diet Recommendations Dysphagia 1 (Puree) solids;Thin liquid Liquid Administration via No straw Medication Administration Whole meds with puree Compensations Slow rate;Small sips/bites;Minimize environmental distractions;Lingual sweep for clearance of pocketing Postural Changes Remain semi-upright after after feeds/meals (Comment);Seated upright at 90 degrees     10/09/2021   9:33 AM Other Recommendations Oral Care Recommendations Oral care QID;Staff/trained caregiver to provide oral care Follow Up Recommendations Acute inpatient rehab (3hours/day) Assistance recommended at discharge Frequent or constant Supervision/Assistance Functional Status Assessment Patient has had a recent decline in their functional status and demonstrates the ability to make significant improvements in function in a reasonable and predictable amount of time.   10/09/2021   9:33 AM Frequency and Duration  Speech Therapy Frequency (ACUTE ONLY)  min 3x week Treatment Duration 2 weeks     10/09/2021   9:27 AM Oral Phase Oral Phase Impaired Oral - Thin Teaspoon WFL Oral - Thin Cup WFL Oral - Thin Straw WFL Oral - Puree Lingual/palatal residue Oral - Regular Impaired mastication;Weak lingual manipulation;Lingual/palatal residue;Delayed oral transit;Piecemeal swallowing    10/09/2021   9:29 AM Pharyngeal Phase Pharyngeal Phase Impaired Pharyngeal- Thin Teaspoon WFL;Delayed swallow initiation-pyriform sinuses Pharyngeal Material does not enter airway Pharyngeal- Thin Cup Delayed swallow initiation-pyriform sinuses;Penetration/Aspiration during swallow Pharyngeal Material enters airway, remains ABOVE vocal cords then ejected out;Material enters airway, passes BELOW cords then ejected out Pharyngeal- Thin Straw Delayed swallow initiation-pyriform sinuses;Penetration/Aspiration during swallow Pharyngeal Material enters airway, passes BELOW cords and not ejected out despite cough attempt by patient Pharyngeal- Puree Delayed swallow initiation-vallecula;Reduced tongue base retraction;Reduced laryngeal elevation;Pharyngeal residue - valleculae;Pharyngeal residue - pyriform Pharyngeal Material does not enter airway Pharyngeal- Regular Delayed swallow initiation-vallecula;Reduced tongue base retraction;Reduced laryngeal elevation;Pharyngeal residue - valleculae;Pharyngeal residue - pyriform Pharyngeal Material does not enter airway    10/09/2021   9:33 AM Cervical Esophageal Phase  Cervical Esophageal Phase [facility] Cherrie Gauze, M.S., [facility] [phone] [place] Denis) Quintella Baton 10/09/2021, 9:56 AM                          Scheduled  Meds:   stroke: early stages of recovery book   Does not apply Once   amLODipine  5 mg Oral Daily   aspirin EC  81 mg Oral Daily   cholecalciferol  1,000 Units Oral Daily   clopidogrel  75 mg Oral Daily   enoxaparin (LOVENOX) injection  40 mg Subcutaneous  Q24H   insulin aspart  0-15 Units Subcutaneous TID WC   insulin aspart  0-5 Units Subcutaneous QHS   vitamin B-12  500 mcg Oral Daily   Continuous Infusions:   LOS: 3 days    Time spent: 35 minutes    Vanna Scotland, MD [facility] Pager [phone]

## 2021-10-09 NOTE — Progress Notes (Signed)
Physical Therapy Treatment Patient Details Name: Gerald Bauer MRN: [id number] DOB: Mar 10, 1946 Today's Date: 10/09/2021   History of Present Illness Pt is a 76 year old male with history of left-sided carotid stenosis, non-insulin-dependent diabetes mellitus type 2, PAD, GERD, mild COPD, history of tobacco use, hyperlipidemia, hypertension, who presents to ED for chief concerns of facial droop on the left side and R sided weakness, expressive aphasia. MD assessment includes aphasia w/ stroke-like symptoms. Per MRI clinical impression includes moderate sized acute ischemic left MCA distribution infarct    PT Comments    Pt was pleasant and motivated to participate during the session and put forth good effort throughout. Unable to communicate due to aphasia but able to appropriately nod yes/no and follow commands. Pt able to complete supine<>sit w/ minA for trunk control; able to move LE's EOB following verbal cuing. Pt with improved sitting balance and able to maintain upright posture w/o LOB and able to perform weight shifts. Pt able to complete sit to stand w/ minA to initiate stand and for steadying to prevent heavy R sided lean. Pt ambulated 34f using R platform walker w/ mod+2 for steadying and to facilitate L weightshift to advance RLE; is able to advance RLE w/o assist but with very min clearance and R knee buckling observed toward end of walk. Pt has difficulty maintaining RUE to platform and reports preference to hemiwalker.  Pt will benefit from IR upon discharge to safely address deficits listed in patient problem list for decreased caregiver assistance and eventual return to PLOF.   Recommendations for follow up therapy are one component of a multi-disciplinary discharge planning process, led by the attending physician.  Recommendations may be updated based on patient status, additional functional criteria and insurance authorization.  Follow Up Recommendations  Acute inpatient rehab  (3hours/day)     Assistance Recommended at Discharge Frequent or constant Supervision/Assistance  Patient can return home with the following Assist for transportation;Help with stairs or ramp for entrance;Two people to help with walking and/or transfers;A lot of help with bathing/dressing/bathroom;Assistance with cooking/housework   Equipment Recommendations  Other (comment) (TBD)    Recommendations for Other Services Rehab consult     Precautions / Restrictions Precautions Precautions: Fall Precaution Comments: R hemi Restrictions Weight Bearing Restrictions: No     Mobility  Bed Mobility Overal bed mobility: Needs Assistance Bed Mobility: Supine to Sit     Supine to sit: Min assist, HOB elevated     General bed mobility comments: extra time and effort to complete, cuing for sequencing to manage legs and minA for trunk control    Transfers Overall transfer level: Needs assistance Equipment used: Right platform walker Transfers: Sit to/from Stand Sit to Stand: Min assist           General transfer comment: minA to initiate stand and for steadying once up. assistance need to place RUE to platform. pt with heavy right sided lean    Ambulation/Gait Ambulation/Gait assistance: Mod assist, +2 physical assistance Gait Distance (Feet): 15 Feet Assistive device: Right platform walker Gait Pattern/deviations: Decreased step length - right, Decreased step length - left, Decreased stride length, Step-to pattern, Decreased weight shift to left Gait velocity: decreased     General Gait Details: Pt able to take steps forwards/backwards w/ heavy cuing needed throughout for sequencing. Pt able to advance RLE with min clearance w/ slight buckling noted toward end of walk. +2 modA for steadying and to aid w/ L side weightshift   Stairs  Wheelchair Mobility    Modified Rankin (Stroke Patients Only)       Balance Overall balance assessment: Needs  assistance Sitting-balance support: Bilateral upper extremity supported, Feet supported Sitting balance-Leahy Scale: Fair Sitting balance - Comments: improved sitting balance and able to maintain upright posture and perform weight shifts   Standing balance support: Bilateral upper extremity supported, Reliant on assistive device for balance, During functional activity Standing balance-Leahy Scale: Poor Standing balance comment: R lateral lean on PT in standing/gait                            Cognition Arousal/Alertness: Awake/alert Behavior During Therapy: WFL for tasks assessed/performed, Flat affect Overall Cognitive Status: Within Functional Limits for tasks assessed                                 General Comments: Aphasia, nods yes/no throughout session, follows simple instructions throughout session        Exercises Other Exercises Other Exercises: Pt education on sequencing using R platform walker Other Exercises: Sitting balance EOB w/ weightshifts in varing directions    General Comments        Pertinent Vitals/Pain Pain Assessment Pain Assessment: No/denies pain    Home Living                          Prior Function            PT Goals (current goals can now be found in the care plan section) Progress towards PT goals: Progressing toward goals    Frequency    7X/week      PT Plan Current plan remains appropriate    Co-evaluation              AM-PAC PT "6 Clicks" Mobility   Outcome Measure  Help needed turning from your back to your side while in a flat bed without using bedrails?: A Little Help needed moving from lying on your back to sitting on the side of a flat bed without using bedrails?: A Little Help needed moving to and from a bed to a chair (including a wheelchair)?: A Lot Help needed standing up from a chair using your arms (e.g., wheelchair or bedside chair)?: A Lot Help needed to walk in  hospital room?: A Lot Help needed climbing 3-5 steps with a railing? : Total 6 Click Score: 13    End of Session Equipment Utilized During Treatment: Gait belt Activity Tolerance: Patient tolerated treatment well Patient left: in chair;with chair alarm set;with call bell/phone within reach;with family/visitor present Nurse Communication: Mobility status PT Visit Diagnosis: Unsteadiness on feet (R26.81);Muscle weakness (generalized) (M62.81);Hemiplegia and hemiparesis;Other abnormalities of gait and mobility (R26.89);Difficulty in walking, not elsewhere classified (R26.2) Hemiplegia - Right/Left: Right Hemiplegia - dominant/non-dominant: Dominant Hemiplegia - caused by: Cerebral infarction     Time: 4098-1191 PT Time Calculation (min) (ACUTE ONLY): 30 min  Charges:                        Turner Daniels, SPT  10/09/2021, 12:34 PM

## 2021-10-09 NOTE — H&P (Signed)
Physical Medicine and Rehabilitation Admission H&P        Chief Complaint  Patient presents with   Extremity Weakness  : HPI: Gerald Bauer is a 76 year old right-handed male with history of hypertension, hyperlipidemia, diabetes mellitus, prostate cancer/prostatectomy 2012, quit smoking 24 years ago.  Per chart review patient lives alone.  1 level home 5 steps to entry.  Independent community ambulator.  Independent ADLs and driving.  He is a retired Dealer.  He does have family who check on him routinely.  Presented to [facility] 10/05/2021 with right side weakness/facial droop and expressive aphasia.  Blood pressure 155/102.  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  No associated hemorrhage or significant mass effect.  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  Some attenuated flow seen distally within the cervical left ICA with subsequent reocclusion by the skull base.  50% atheromatous stenosis of the origin of the cervical right ICA.  Wide patency of the vertebral arteries within the neck.  Admission chemistries unremarkable except glucose 119, urine drug screen negative, alcohol negative, hemoglobin A1c 7.1.  Echocardiogram with ejection fraction of 55 to 60% no wall motion abnormalities grade 1 diastolic dysfunction.  Neurology follow-up currently maintained on aspirin 81 mg daily and Plavix 75 mg daily for CVA prophylaxis indefinitely.  Lovenox added for DVT prophylaxis.  Maintained on dysphagia #1 nectar thick liquid diet.  Therapy evaluations completed due to patient decreased functional ability right-sided weakness and aphasia was admitted for a comprehensive rehab program.   Review of Systems  Constitutional:  Negative for chills and fever.  HENT:  Negative for hearing loss.   Eyes:  Negative for blurred vision and double vision.  Respiratory:  Negative for  cough and shortness of breath.   Cardiovascular:  Negative for chest pain, palpitations and leg swelling.  Gastrointestinal:  Positive for constipation. Negative for heartburn, nausea and vomiting.  Genitourinary:  Positive for urgency. Negative for dysuria, flank pain and hematuria.  Musculoskeletal:  Positive for myalgias.  Skin:  Negative for rash.  Neurological:  Positive for speech change and weakness.  All other systems reviewed and are negative.       Past Medical History:  Diagnosis Date   Acid reflux     Arthritis      hands   Carotid atherosclerosis     Diabetes mellitus without complication (HCC)     Hyperlipemia     Hypertension     Prostate cancer ([place])      history   Tachycardia           Past Surgical History:  Procedure Laterality Date   APPENDECTOMY   1973   CHOLECYSTECTOMY   2013   PROSTATECTOMY   2012         Family History  Problem Relation Age of Onset   Dementia Mother     High Cholesterol Mother     Hypertension Mother     Arthritis Mother     Hyperlipidemia Mother     Heart disease Father     Heart attack Father     Arthritis Maternal Grandmother     Cancer Maternal Grandmother          colon   Prostate cancer Brother     Cancer Brother          prostate   Cancer Maternal Uncle  colon   COPD Maternal Uncle     Stroke Paternal Grandmother     Diabetes Paternal Grandmother     Aneurysm Paternal Grandfather      Social History:  reports that he quit smoking about 24 years ago. His smoking use included cigarettes. He has a 40.00 pack-year smoking history. He has quit using smokeless tobacco. He reports that he does not drink alcohol and does not use drugs. Allergies:       Allergies  Allergen Reactions   Amoxicillin Swelling   Statins Other (See Comments)      Affects the liver   Welchol [Colesevelam Hcl] Other (See Comments)      affects the liver          Medications Prior to Admission  Medication Sig Dispense Refill    albuterol (VENTOLIN HFA) 108 (90 Base) MCG/ACT inhaler Inhale 2 puffs into the lungs every 6 (six) hours as needed for wheezing or shortness of breath. 8 g 0   aspirin EC 81 MG tablet Take 81 mg by mouth daily. Swallow whole.       Cholecalciferol (VITAMIN D3) 25 MCG (1000 UT) CAPS Take 1,000 Units by mouth daily.       Cyanocobalamin (VITAMIN B12) 500 MCG TABS Take 500 mcg by mouth daily.       Lancets (ONETOUCH DELICA PLUS [id number]) MISC USE TO CHECK BLOOD GLUCOSE  ONCE DAILY AS DIRECTED 100 each 2   metFORMIN (GLUCOPHAGE-XR) 500 MG 24 hr tablet TAKE 1 TABLET BY MOUTH  TWICE DAILY WITH A MEAL 180 tablet 3   ONETOUCH VERIO test strip CHECK FINGERSTICK BLOOD  SUGARS ONCE DAILY 100 strip 2   REPATHA SURECLICK 622 MG/ML SOAJ INJECT '140MG'$  SUBCUTANEOUSLY  EVERY 2 WEEKS 6 mL 2          Home: Home Living Family/patient expects to be discharged to:: Private residence Living Arrangements: Alone Available Help at Discharge: Family, Available 24 hours/day Type of Home: House Home Access: Stairs to enter [employer] of Steps: 5-6 Entrance Stairs-Rails: Can reach both Home Layout: One level Bathroom Shower/Tub: Multimedia programmer: Standard Bathroom Accessibility: Yes Home Equipment: None  Lives With: Alone   Functional History: Prior Function Prior Level of Function : Independent/Modified Independent, Driving Mobility Comments: Ind community ambulator using no AD, no hx of falls ADLs Comments: Ind w/ ADLs, IADLs, and drives at baseline. Retired Manufacturing engineer Status:  Mobility: Bed Mobility Overal bed mobility: Needs Assistance Bed Mobility: Supine to Sit Rolling: Min guard Sidelying to sit: Min assist, HOB elevated (Cuing to push RLE to EOB with LLE with pt able to move RLE somewhat first, extra time to upright trunk, heavy reliance on hospital bed features) Supine to sit: Min assist, Mod assist Sit to supine: Mod assist General bed mobility comments: pt  was able to roll R to short sit with increased time and vcs for technique and sequencing Transfers Overall transfer level: Needs assistance Equipment used: None, Hemi-walker Transfers: Sit to/from Stand Sit to Stand: Min assist (education re: safe hand placement to push to standing then place LUE on RW, STS from recliner with armrest with min assist, mod assist for STS from standard chair without armrests) Bed to/from chair/wheelchair/BSC transfer type:: Stand pivot Stand pivot transfers: Mod assist (to R with cuing for hand placement & sequencing, assistance with pivoting) Step pivot transfers: Mod assist, Max assist General transfer comment: pt stood to hemiwalker with min assist. Pryor Curia has to assist RUE and  remains HHA throughout gait training. elected not to use platform walker to allow author better access to RLE for advancement Ambulation/Gait Ambulation/Gait assistance: Mod assist, Max assist Gait Distance (Feet): 12 Feet (+ 4 ft) Assistive device: Hemi-walker Gait Pattern/deviations: Decreased step length - right, Decreased step length - left, Decreased stride length, Step-to pattern, Decreased weight shift to right, Decreased weight shift to left, Decreased stance time - right General Gait Details: Pt ambulates with hemiwalker with mod<>max assist with cuing for RLE knee extension in stance phase, weight shifting to L for increased ease of RLE foot advancement, assistance to advance RLE but towards end of gait pt able to advance RLE with extra time, PT providing blocking at R knee to prevent buckling, cuing for gait pattern with AD Gait velocity: decreased   ADL: ADL Overall ADL's : Needs assistance/impaired Grooming Details (indicate cue type and reason): hand over hand assistance to bring R hand to face. Toilet Transfer: Moderate assistance, Maximal assistance Toilet Transfer Details (indicate cue type and reason): simulated transfer   Cognition: Cognition Overall Cognitive  Status: Within Functional Limits for tasks assessed Arousal/Alertness: Awake/alert Orientation Level: Oriented to person, Oriented to place, Oriented to situation (PT ABLE TO NOD CORRECT RESPONSES) Cognition Arousal/Alertness: Awake/alert Behavior During Therapy: WFL for tasks assessed/performed, Flat affect Overall Cognitive Status: Within Functional Limits for tasks assessed General Comments: Aphasia, nods yes/no throughout session, follows simple instructions throughout session   Physical Exam: Blood pressure (!) 138/92, pulse 80, temperature 98 F (36.7 C), resp. rate 16, height '5\' 7"'$  (1.702 m), weight 79.8 kg, SpO2 97 %. Physical Exam Neurological:     Comments: Patient is alert and makes eye contact with examiner.  He is aphasic but was able to provide yes no responses and his name.  He did follow some simple commands but inconsistently.   General: No acute distress Mood and affect are appropriate Heart: Regular rate and rhythm no rubs murmurs or extra sounds Lungs: Clear to auscultation, breathing unlabored, no rales or wheezes Abdomen: Positive bowel sounds, soft nontender to palpation, nondistended Extremities: No clubbing, cyanosis, or edema Skin: No evidence of breakdown, no evidence of rash Neurologic: Cranial nerves II through XII intact, motor strength is 5/5 in left deltoid, bicep, tricep, grip, hip flexor, knee extensors, ankle dorsiflexor and plantar flexor RUE 2- RLE 4/5 Sensory exam normal sensation to light touch and proprioception in bilateral upper and lower extremities  Musculoskeletal: Full range of motion in all 4 extremities. No joint swelling       Lab Results Last 48 Hours        Results for orders placed or performed during the hospital encounter of 10/05/21 (from the past 48 hour(s))  Glucose, capillary     Status: Abnormal    Collection Time: 10/07/21  9:16 AM  Result Value Ref Range    Glucose-Capillary 178 (H) 70 - 99 mg/dL      Comment: Glucose  reference range applies only to samples taken after fasting for at least 8 hours.  Glucose, capillary     Status: Abnormal    Collection Time: 10/07/21 11:34 AM  Result Value Ref Range    Glucose-Capillary 228 (H) 70 - 99 mg/dL      Comment: Glucose reference range applies only to samples taken after fasting for at least 8 hours.  Glucose, capillary     Status: Abnormal    Collection Time: 10/07/21  4:51 PM  Result Value Ref Range    Glucose-Capillary 122 (H) 70 -  99 mg/dL      Comment: Glucose reference range applies only to samples taken after fasting for at least 8 hours.  Glucose, capillary     Status: Abnormal    Collection Time: 10/07/21 10:01 PM  Result Value Ref Range    Glucose-Capillary 220 (H) 70 - 99 mg/dL      Comment: Glucose reference range applies only to samples taken after fasting for at least 8 hours.  Glucose, capillary     Status: Abnormal    Collection Time: 10/08/21  9:05 AM  Result Value Ref Range    Glucose-Capillary 161 (H) 70 - 99 mg/dL      Comment: Glucose reference range applies only to samples taken after fasting for at least 8 hours.  Glucose, capillary     Status: Abnormal    Collection Time: 10/08/21 12:28 PM  Result Value Ref Range    Glucose-Capillary 238 (H) 70 - 99 mg/dL      Comment: Glucose reference range applies only to samples taken after fasting for at least 8 hours.  Glucose, capillary     Status: Abnormal    Collection Time: 10/08/21  4:32 PM  Result Value Ref Range    Glucose-Capillary 198 (H) 70 - 99 mg/dL      Comment: Glucose reference range applies only to samples taken after fasting for at least 8 hours.  Glucose, capillary     Status: Abnormal    Collection Time: 10/08/21  7:51 PM  Result Value Ref Range    Glucose-Capillary 264 (H) 70 - 99 mg/dL      Comment: Glucose reference range applies only to samples taken after fasting for at least 8 hours.      Imaging Results (Last 48 hours)  No results found.         Blood  pressure (!) 138/92, pulse 80, temperature 98 F (36.7 C), resp. rate 16, height '5\' 7"'$  (1.702 m), weight 79.8 kg, SpO2 97 %.   Medical Problem List and Plan: 1. 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  Antithrombotics: -DVT/anticoagulation:  Pharmaceutical: Lovenox             -antiplatelet therapy: Aspirin 81 mg daily and Plavix 75 mg daily to continue indefinitely 3. Pain Management: Tylenol as needed 4. Mood/Behavior/Sleep: Provide emotional support             -antipsychotic agents: N/A 5. Neuropsych/cognition: This patient is not capable of making decisions on his own behalf. 6. Skin/Wound Care: Routine skin checks 7. Fluids/Electrolytes/Nutrition: Routine in and outs with follow-up chemistries 8.  Hypertension.  Norvasc 5 mg daily.  Monitor with increased mobility 9.  Diabetes mellitus.  Hemoglobin A1c 7.1.  Presently with SSI.  Patient maintained on Glucophage 500 mg twice daily prior to admission.  Resume as needed 10.  Hyperlipidemia.  Intolerant to statin.  Maintained on Repatha 140 mg subcutaneous every 2 weeks prior to admission. 11.  History of prostate cancer/prostatectomy 2024.  Follow-up outpatient     Cathlyn Parsons, PA-C 10/09/2021  "I have personally performed a face to face diagnostic evaluation of this patient.  Additionally, I have reviewed and concur with the physician assistant's documentation above." Charlett Blake M.D. [facility] Fellow  Am Acad of [employer] Med and [place] of Electro[employer]ostic Med Fellow Am Board of Interventional Pain

## 2021-10-09 NOTE — Inpatient Diabetes Management (Signed)
Inpatient Diabetes Program Recommendations  AACE/ADA: New Consensus Statement on Inpatient Glycemic Control   Target Ranges:  Prepandial:   less than 140 mg/dL      Peak postprandial:   less than 180 mg/dL (1-2 hours)      Critically ill patients:  140 - 180 mg/dL    Latest Reference Range & Units 10/08/21 09:05 10/08/21 12:28 10/08/21 16:32 10/08/21 19:51  Glucose-Capillary 70 - 99 mg/dL 161 (H) 238 (H) 198 (H) 264 (H)   Review of Glycemic Control  Diabetes history: DM2 Outpatient Diabetes medications: Metformin XR 500 mg BID Current orders for Inpatient glycemic control: Novolog 0-15 units TID with meals, Novolog 0-5 units QHS  Inpatient Diabetes Program Recommendations:    Insulin: Please consider ordering Novolog 3 units TID with meals for meal coverage if patient eats at least 50% of meals.  Thanks, Barnie Alderman, RN, MSN, [place] [facility] Coordinator Inpatient Diabetes Program [phone] (Team Pager from 8am to [place])

## 2021-10-09 NOTE — Progress Notes (Signed)
Signed     PMR Admission Coordinator Pre-Admission Assessment   Patient: Gerald Bauer is an 76 y.o., male MRN: [id number] DOB: 10/31/1945 Height: '5\' 7"'$  (170.2 cm) Weight: 79.8 kg   Insurance Information HMO:     PPO: yes     PCP:      IPA:      80/20:      OTHER:  PRIMARY: U[employer] Medicare      Policy#: [id number]      Subscriber: patient CM Name:       Phone#: [phone] Fax#: [phone] Pre-Cert#: [id number] Sampson Goon #: [id number] Received approval from [place] with [place] on 10/09/21. Pt approved for 7 days from 7/17-7/23.     Employer:  Benefits:  Phone #: [email or web address]     Name:  Eff. Date: 03/26/21-03/25/22     Deduct: $0 (does not have deductible)      Out of Pocket Max: $4,900 Life Max: NA CIR: $325/day co-pay for days 1-5, 100% coverage days 6+      SNF: 100% coverage days 1-20, $196 co-pay/day for days 21-45, 100% coverage 46-100 Outpatient: $20/visit co-pay     Co-Pay:  Home Health: 100% coverage      Co-Pay:  DME: 80% coverage     Co-Pay: 20% co-insurance Providers: in-network SECONDARY:       Policy#:      Phone#:    Development worker, community:       Phone#:    The Engineer, petroleum" for patients in Inpatient Rehabilitation Facilities with attached "Privacy Act [place] Records" was provided and verbally reviewed with: Family   Emergency Contact Information Contact Information       Name Relation Home Work Viola Son     [phone]           Current Medical History  Patient Admitting Diagnosis: CVA History of Present Illness: Pt is a 76 year old male with medical hx significant for: carotid stenosis, mild COPD, GERD, PAD, NIDDM II, h/o tobacco use, hyperlipidemia, HTN. Pt presented to [facility] on 10/05/21 d/t speech difficulties and left facial droop. CT head showed mild hypoattenuation in inferior left frontal lobe and anterior left insula. 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 TEE showed EF 55-60%. Therapy evaluations completed and CIR recommended d/t pt's deficits in functional mobility, ability to complete ADLs independently, dysphagia, and aphasia. Complete NIHSS TOTAL: 13   Patient's medical record from [facility] has been reviewed by the rehabilitation admission coordinator and physician.   Past Medical History      Past Medical History:  Diagnosis Date   Acid reflux     Arthritis      hands   Carotid atherosclerosis     Diabetes mellitus without complication (HCC)     Hyperlipemia     Hypertension     Prostate cancer ([place])      history   Tachycardia        Has the patient had major surgery during 100 days prior to admission? No   Family History   family history includes Aneurysm in his paternal grandfather; Arthritis in his maternal grandmother and mother; COPD in his maternal uncle; Cancer in his brother, maternal grandmother, and maternal uncle; Dementia in his mother; Diabetes in his paternal grandmother; Heart attack in his father; Heart disease in  his father; High Cholesterol in his mother; Hyperlipidemia in his mother; Hypertension in his mother; Prostate cancer in his brother; Stroke in his paternal grandmother.   Current Medications   Current Facility-Administered Medications:     stroke: early stages of recovery book, , Does not apply, Once, Cox, Amy N, DO   acetaminophen (TYLENOL) tablet 650 mg, 650 mg, Oral, Q4H PRN, 650 mg at 10/08/21 0925 **OR** acetaminophen (TYLENOL) 160 MG/5ML solution 650 mg, 650 mg, Per Tube, Q4H PRN **OR** acetaminophen (TYLENOL) suppository 650 mg, 650 mg, Rectal, Q4H PRN, Cox, Amy N, DO   albuterol (PROVENTIL) (2.5 MG/3ML) 0.083% nebulizer solution 2.5 mg, 2.5 mg, Inhalation, Q6H PRN, Cox, Amy N, DO   amLODipine (NORVASC) tablet 5 mg, 5 mg,  Oral, Daily, Alanda Amass, Aseem, MD, 5 mg at 10/09/21 0907   aspirin EC tablet 81 mg, 81 mg, Oral, Daily, Cox, Amy N, DO, 81 mg at 10/09/21 0906   cholecalciferol (VITAMIN D3) tablet 1,000 Units, 1,000 Units, Oral, Daily, Cox, Amy N, DO, 1,000 Units at 10/09/21 0907   clopidogrel (PLAVIX) tablet 75 mg, 75 mg, Oral, Daily, Ghimire, Dante Gang, MD, 75 mg at 10/09/21 0906   enoxaparin (LOVENOX) injection 40 mg, 40 mg, Subcutaneous, Q24H, Cox, Amy N, DO, 40 mg at 10/09/21 0907   insulin aspart (novoLOG) injection 0-15 Units, 0-15 Units, Subcutaneous, TID WC, Cox, Amy N, DO, 3 Units at 10/09/21 1213   insulin aspart (novoLOG) injection 0-5 Units, 0-5 Units, Subcutaneous, QHS, Cox, Amy N, DO, 3 Units at 10/08/21 2042   labetalol (NORMODYNE) injection 5 mg, 5 mg, Intravenous, Q3H PRN, Cox, Amy N, DO   senna-docusate (Senokot-S) tablet 1 tablet, 1 tablet, Oral, QHS PRN, Cox, Amy N, DO   vitamin B-12 (CYANOCOBALAMIN) tablet 500 mcg, 500 mcg, Oral, Daily, Cox, Amy N, DO, 500 mcg at 10/09/21 0906   Patients Current Diet:  Diet Order                  DIET - DYS 1 Room service appropriate? Yes with Assist; Fluid consistency: Thin  Diet effective now             Diet - low sodium heart healthy                         Precautions / Restrictions Precautions Precautions: Fall Precaution Comments: R hemi Restrictions Weight Bearing Restrictions: No    Has the patient had 2 or more falls or a fall with injury in the past year? No   Prior Activity Level Community (5-7x/wk): drives, leaves house when feels like it   Prior Functional Level Self Care: Did the patient need help bathing, dressing, using the toilet or eating? Independent   Indoor Mobility: Did the patient need assistance with walking from room to room (with or without device)? Independent   Stairs: Did the patient need assistance with internal or external stairs (with or without device)? Independent   Functional Cognition: Did the patient  need help planning regular tasks such as shopping or remembering to take medications? Needed some help   Patient Information Are you of Hispanic, Latino/a,or Spanish origin?: X. Patient unable to respond, A. No, not of Hispanic, Latino/a, or Spanish origin What is your race?: X. Patient unable to respond, A. White Do you need or want an interpreter to communicate with a doctor or health care staff?: 9. Unable to respond   Patient's Response To:  Health Literacy and Transportation Is the patient  able to respond to health literacy and transportation needs?: No Health Literacy - How often do you need to have someone help you when you read instructions, pamphlets, or other written material from your doctor or pharmacy?: Patient unable to respond In the past 12 months, has lack of transportation kept you from medical appointments or from getting medications?:  (pt not able to respond) In the past 12 months, has lack of transportation kept you from meetings, work, or from getting things needed for daily living?:  (pt not able to respond)   Home Assistive Devices / [place] Devices/Equipment: None Home Equipment: None   Prior Device Use: Indicate devices/aids used by the patient prior to current illness, exacerbation or injury? None of the above   Current Functional Level Cognition   Arousal/Alertness: Awake/alert Overall Cognitive Status: Within Functional Limits for tasks assessed Orientation Level: Other (comment) (UTA, pt nonverbal) General Comments: Aphasia, nods yes/no throughout session, follows simple instructions throughout session    Extremity Assessment (includes Sensation/Coordination)   Upper Extremity Assessment: RUE deficits/detail RUE Deficits / Details: 3-/5 limited AROM against gravity and PROM WFLs  Lower Extremity Assessment: Defer to PT evaluation RLE Deficits / Details: RLE MMT 3/5 hip flex 2/5; LLE WFL. Sensation WNL bilaterally     ADLs   Overall  ADL's : Needs assistance/impaired Grooming Details (indicate cue type and reason): hand over hand assistance to bring R hand to face. Toilet Transfer: Moderate assistance, Maximal assistance Toilet Transfer Details (indicate cue type and reason): simulated transfer     Mobility   Overal bed mobility: Needs Assistance Bed Mobility: Supine to Sit Rolling: Min guard Sidelying to sit: Min assist, HOB elevated (Cuing to push RLE to EOB with LLE with pt able to move RLE somewhat first, extra time to upright trunk, heavy reliance on hospital bed features) Supine to sit: Min assist, HOB elevated Sit to supine: Mod assist General bed mobility comments: extra time and effort to complete, cuing for sequencing to manage legs and minA for trunk control     Transfers   Overall transfer level: Needs assistance Equipment used: Right platform walker Transfers: Sit to/from Stand Sit to Stand: Min assist Bed to/from chair/wheelchair/BSC transfer type:: Stand pivot Stand pivot transfers: Mod assist (to R with cuing for hand placement & sequencing, assistance with pivoting) Step pivot transfers: Mod assist, Max assist General transfer comment: minA to initiate stand and for steadying once up. assistance need to place RUE to platform. pt with heavy right sided lean     Ambulation / Gait / Stairs / Wheelchair Mobility   Ambulation/Gait Ambulation/Gait assistance: Mod assist, +2 physical assistance Gait Distance (Feet): 15 Feet Assistive device: Right platform walker Gait Pattern/deviations: Decreased step length - right, Decreased step length - left, Decreased stride length, Step-to pattern, Decreased weight shift to left General Gait Details: Pt able to take steps forwards/backwards w/ heavy cuing needed throughout for sequencing. Pt able to advance RLE with min clearance w/ slight buckling noted toward end of walk. +2 modA for steadying and to aid w/ L side weightshift Gait velocity: decreased      Posture / Balance Dynamic Sitting Balance Sitting balance - Comments: improved sitting balance and able to maintain upright posture and perform weight shifts Balance Overall balance assessment: Needs assistance Sitting-balance support: Bilateral upper extremity supported, Feet supported Sitting balance-Leahy Scale: Fair Sitting balance - Comments: improved sitting balance and able to maintain upright posture and perform weight shifts Standing balance support: Bilateral upper extremity  supported, Reliant on assistive device for balance, During functional activity Standing balance-Leahy Scale: Poor Standing balance comment: R lateral lean on PT in standing/gait     Special needs/care consideration Diabetic management novoLOG 0-5 units daily at bedtime; novoLOG 0-15 units 3x daily at meals. Bladder incontinence, External urinary catheter    Previous Home Environment (from acute therapy documentation) Living Arrangements: Alone  Lives With: Alone Available Help at Discharge: Family, Available 24 hours/day Type of Home: House Home Layout: One level Home Access: Stairs to enter Entrance Stairs-Rails: Can reach both Entrance Stairs-Number of Steps: 5-6 Bathroom Shower/Tub: Multimedia programmer: Programmer, systems: Yes [place]: No   Discharge Living Setting Plans for Discharge Living Setting: House (son's house) Type of Home at Discharge: [place]: One level Discharge Home Access: Stairs to enter Entrance Stairs-Rails: Can reach both Entrance Stairs-Number of Steps: 3 Discharge Bathroom Shower/Tub: Tub/shower unit, Walk-in shower Discharge Bathroom Toilet: Standard Discharge Bathroom Accessibility: Yes How Accessible: Accessible via walker Does the patient have any problems obtaining your medications?: No   Social/Family/Support Systems Anticipated Caregiver: Kaito Schulenburg, son and other family Anticipated Caregiver's Contact  Information: [phone] Caregiver Availability: 24/7 Discharge Plan Discussed with Primary Caregiver: Yes Is Caregiver In Agreement with Plan?: Yes Does Caregiver/Family have Issues with Lodging/Transportation while Pt is in Rehab?: No   Goals Patient/Family Goal for Rehab: Supervision-MinA:PT/OT,Min-ModA:ST Expected length of stay: 14-16 days Pt/Family Agrees to Admission and willing to participate: Yes Program Orientation Provided & Reviewed with Pt/Caregiver Including Roles  & Responsibilities: Yes   Decrease burden of Care through IP rehab admission: NA   Possible need for SNF placement upon discharge: Not anticipated   Patient Condition: I have reviewed medical records from [facility], spoken with CM, and son. I discussed via phone for inpatient rehabilitation assessment.  Patient will benefit from ongoing PT, OT, and SLP, can actively participate in 3 hours of therapy a day 5 days of the week, and can make measurable gains during the admission.  Patient will also benefit from the coordinated team approach during an Inpatient Acute Rehabilitation admission.  The patient will receive intensive therapy as well as Rehabilitation physician, nursing, social worker, and care management interventions.  Due to bladder management, safety, disease management, medication administration, pain management, and patient education the patient requires 24 hour a day rehabilitation nursing.  The patient is currently Min-Max A with mobility and Mod-Max A with basic ADLs.  Discharge setting and therapy post discharge at home with home health is anticipated.  Patient has agreed to participate in the Acute Inpatient Rehabilitation Program and will admit today.   Preadmission Screen Completed By:  Bethel Born, 10/09/2021 1:26 PM ______________________________________________________________________   Discussed status with Dr. Letta Pate on 10/09/21. at 1:29 PM and received approval  for admission today.   Admission Coordinator:  Bethel Born, CCC-SLP, time 1:29 PM/Date 10/09/21     Assessment/Plan: Diagnosis: L MCA infarct Does the need for close, 24 hr/day Medical supervision in concert with the patient's rehab needs make it unreasonable for this patient to be served in a less intensive setting? Yes Co-Morbidities requiring supervision/potential complications: COPD, NIDDM, HTN Due to bladder management, bowel management, safety, skin/wound care, disease management, medication administration, pain management, and patient education, does the patient require 24 hr/day rehab nursing? Yes Does the patient require coordinated care of a physician, rehab nurse, PT, OT, and SLP to address physical and functional deficits in the context of the  above medical diagnosis(es)? Yes Addressing deficits in the following areas: balance, endurance, locomotion, strength, transferring, bowel/bladder control, bathing, dressing, feeding, grooming, toileting, cognition, speech, language, swallowing, and psychosocial support Can the patient actively participate in an intensive therapy program of at least 3 hrs of therapy 5 days a week? Yes The potential for patient to make measurable gains while on inpatient rehab is good Anticipated functional outcomes upon discharge from inpatient rehab: min assist PT, min assist OT, min assist SLP Estimated rehab length of stay to reach the above functional goals is: 14-16d Anticipated discharge destination: Home 10. Overall Rehab/Functional Prognosis: good     MD Signature: Charlett Blake M.D. [place] [facility] Fellow Am Acad of Phys Med and Rehab Diplomate Am Board of Electrodiagnostic Med Fellow Am Board of Interventional Pain

## 2021-10-09 NOTE — Progress Notes (Signed)
Inpatient Rehab Admissions Coordinator:  There is a bed available for pt in CIR today. Dr. Alanda Amass is aware and in agreement. Spoke with pt's son [name] and he is aware. NSG and TOC also aware.   Gayland Curry, [place], [place] Admissions Coordinator [phone]

## 2021-10-09 NOTE — Discharge Instructions (Addendum)
Inpatient Rehab Discharge Instructions  Geral Coker Discharge date and time: 10/31/2021  Activities/Precautions/ Functional Status: Activity: No strenuous activity until cleared by MD Diet: dysphagia 3>>cut up solid foods into bite size pieces; avoid conversation and minimize distractions during meals Wound Care: Routine skin checks Functional status:  ___ No restrictions     ___ Walk up steps independently ___ 24/7 supervision/assistance   ___ Walk up steps with assistance __x_ Intermittent supervision/assistance  ___ Bathe/dress independently ___ Walk with walker     _x__ Bathe/dress with assistance ___ Walk Independently    ___ Shower independently ___ Walk with assistance    ___ Shower with assistance __x_ No alcohol     ___ Return to work/school ________  COMMUNITY REFERRALS UPON DISCHARGE:     Outpatient: PT     OT    ST                Agency: [facility]              Appointment Date/Time: TBD  Medical Equipment/Items Ordered: Conservation officer, nature                                                  Agency/Supplier: [employer] [id number]   Special Instructions:  No driving smoking or alcohol  Continue to do your fingersticks daily and record. Bring this information to your primary care physician.   My questions have been answered and I understand these instructions. I will adhere to these goals and the provided educational materials after my discharge from the hospital.  Patient/Caregiver Signature _______________________________ Date __________  Clinician Signature _______________________________________ Date __________  Please bring this form and your medication list with you to all your follow-up doctor's appointments.  STROKE/TIA DISCHARGE INSTRUCTIONS SMOKING Cigarette smoking nearly doubles your risk of having a stroke & is the single most alterable risk factor  If you smoke or have smoked in the last 12 months, you are advised to quit  smoking for your health. Most of the excess cardiovascular risk related to smoking disappears within a year of stopping. Ask you doctor about anti-smoking medications [place] Quit Line: 1-800-QUIT NOW Free Smoking Cessation Classes [phone]  CHOLESTEROL Know your levels; limit fat & cholesterol in your diet  Lipid Panel     Component Value Date/Time   CHOL 112 10/06/2021 0510   CHOL 122 08/10/2019 0826   TRIG 105 10/06/2021 0510   HDL 47 10/06/2021 0510   HDL 48 08/10/2019 0826   CHOLHDL 2.4 10/06/2021 0510   VLDL 21 10/06/2021 0510   LDLCALC 44 10/06/2021 0510   LDLCALC 50 01/23/2021 1400     Many patients benefit from treatment even if their cholesterol is at goal. Goal: Total Cholesterol (CHOL) less than 160 Goal:  Triglycerides (TRIG) less than 150 Goal:  HDL greater than 40 Goal:  LDL (LDLCALC) less than 100   BLOOD PRESSURE American Stroke Association blood pressure target is less that 120/80 mm/Hg  Your discharge blood pressure is:  BP: 130/71 Monitor your blood pressure Limit your salt and alcohol intake Many individuals will require more than one medication for high blood pressure  DIABETES (A1c is a blood sugar average for last 3 months) Goal HGBA1c is under 7% (HBGA1c is blood sugar average for last 3 months)  Diabetes:   Lab Results  Component  Value Date   HGBA1C 7.1 (H) 10/06/2021    Your HGBA1c can be lowered with medications, healthy diet, and exercise. Check your blood sugar as directed by your physician Call your physician if you experience unexplained or low blood sugars.  PHYSICAL ACTIVITY/REHABILITATION Goal is 30 minutes at least 4 days per week  Activity: Increase activity slowly, Therapies: Physical Therapy: Outpatient, Occupational Therapy: Outpatient, and Speech Therapy: Outpatient Return to work: n/a Activity decreases your risk of heart attack and stroke and makes your heart stronger.  It helps control your weight and blood pressure; helps you  relax and can improve your mood. Participate in a regular exercise program. Talk with your doctor about the best form of exercise for you (dancing, walking, swimming, cycling).  DIET/WEIGHT Goal is to maintain a healthy weight  Your discharge diet is:  Diet Order             DIET DYS 3 Room service appropriate? Yes; Fluid consistency: Thin  Diet effective 0500                   liquids Your height is:  Height: '5\' 7"'$  (170.2 cm) Your current weight is: Weight: 71.6 kg Your Body Mass Index (BMI) is:  BMI (Calculated): 24.72 Following the type of diet specifically designed for you will help prevent another stroke. Your goal weight range is:   Your goal Body Mass Index (BMI) is 19-24. Healthy food habits can help reduce 3 risk factors for stroke:  High cholesterol, hypertension, and excess weight.  RESOURCES Stroke/Support Group:  Call [phone]   STROKE EDUCATION PROVIDED/REVIEWED AND GIVEN TO PATIENT Stroke warning signs and symptoms How to activate emergency medical system (call 911). Medications prescribed at discharge. Need for follow-up after discharge. Personal risk factors for stroke. Pneumonia vaccine given: No Flu vaccine given: No My questions have been answered, the writing is legible, and I understand these instructions.  I will adhere to these goals & educational materials that have been provided to me after my discharge from the hospital.

## 2021-10-09 NOTE — Progress Notes (Signed)
Speech Language Pathology Treatment:    Patient Details Name: Gerald Bauer MRN: [id number] DOB: Jan 24, 1946 Today's Date: 10/09/2021 Time: 1355-1405 SLP Time Calculation (min) (ACUTE ONLY): 10 min  Assessment / Plan / Recommendation Clinical Impression  Pt seen for skilled SLP services. Pt alert, pleasant, and cooperative. Feeding self lunch upon SLP entrance to room. Son, daughter, and daughter-in-law at bedside.   Per family and RN, pt with plan to d/c to CIR within the next 20 minutes. Given imminent d/c, direct speech/language and dysphagia tx deferred. Treatment mainly consisting of direct SLP observation, discussion with family, and pt/family education.   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 Pt continues to communicate mainly via yes/no questions, some automatic verbal responses (with low vocal intensity), and gestural communication (e.g. thumbs up). Pt's basic yes/no questions approx 80% accurate; improving with repetition of stimuli. Daughter reports spontaneous speech this AM, pt stating "hey" repeatedly to get her attention.   Discussed benefits of CIR and rationale for intensive stroke rehabilitation with pt and family. Also reviewed communication strategies (e.g. written prompts, allowing time to respond, yes/no questions), environmental modifications to improve/support communication (e.g. reduction of environmental stimuli), and pt's CLOF for communication. Pt nodding in agreement, ?full understanding given complexity of information in setting of aphasia. Family verbalized understanding/agreement.   Briefly observed pt with pureed items and thin liquids (via cup sip) from meal tray. Mild oral residual noted with cues needed to utilize liquid wash to clear. Pt prompted by famiy. No overt s/sx pharyngeal dysphagia noted. Pt feeding self with set up.   Recommend continuation of pureed diet with nectar-thick liquids and safe swallowing  strategies/aspiration precautions as outlined below.   Recommend ongoing SLP services at [facility] for functional communication dysphagia management.    HPI HPI: Pt  is a 76 year old male with history of left-sided carotid stenosis, non-insulin-dependent diabetes mellitus type 2, PAD, GERD, mild COPD, history of tobacco use, hyperlipidemia, hypertension, who presents emergency department for chief concerns of facial droop, expressive aphasia, and R sided weakness.  MRI/MRA Head and Neck Imaging: MRI HEAD IMPRESSION:     1. 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 No associated hemorrhage or significant regional mass effect.  2. Loss of normal flow voids throughout the left ICA and MCA,  consistent with slow flow and/or occlusion. Susceptibility artifact  within proximal left MCA branches consistent with intraluminal  thrombus.  3. Underlying mild chronic microvascular ischemic disease. Tiny  remote left ACA distribution infarct.     MRA HEAD IMPRESSION:   "1. Interval near complete occlusion of the left ICA and MCA,  presumably acute in nature given the presence of the acute left MCA  territory infarct. Left A1 segment not well seen either, also likely  occluded.  2. Otherwise wide patency of the major intracranial arterial  circulation. No other hemodynamically significant or correctable  stenosis.     MRA NECK IMPRESSION:  1. 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 Some attenuated flow seen  distally within the cervical left ICA, with subsequent reocclusion  by the skull base.  2. 50% atheromatous stenosis at the origin of the cervical right  ICA. Otherwise wide patency of the right carotid artery system.  3. Wide patency of the vertebral arteries within the neck.".      SLP Plan  Continue with current plan of care  Recommendations for follow up therapy are one component of a multi-disciplinary discharge  planning process, led by the attending physician.  Recommendations may be updated based on patient status, additional functional criteria and insurance authorization.    Recommendations  Diet recommendations: Dysphagia 1 (puree);Nectar-thick liquid Liquids provided via: Cup Medication Administration: Whole meds with liquid Supervision: Full supervision/cueing for compensatory strategies (set up/support) Compensations: Slow rate;Small sips/bites;Minimize environmental distractions;Lingual sweep for clearance of pocketing Postural Changes and/or Swallow Maneuvers: Out of bed for meals;Seated upright 90 degrees;Upright 30-60 min after meal                Oral Care Recommendations: Oral care BID;Oral care before and after PO;Staff/trained caregiver to provide oral care Follow Up Recommendations: Acute inpatient rehab (3hours/day) Assistance recommended at discharge: Frequent or constant Supervision/Assistance SLP Visit Diagnosis: Dysphagia, oropharyngeal phase (R13.12);Aphasia (R47.01) Plan: Continue with current plan of care          Cherrie Gauze, M.S., [facility] [phone] [place])  Quintella Baton  10/09/2021, 2:29 PM

## 2021-10-09 NOTE — Progress Notes (Signed)
Pt being transferred to CIR in [place]. Report called to [place] on 4W. PIV and tele removed. Transport scheduled for 1430-1500.

## 2021-10-09 NOTE — Care Management Important Message (Signed)
Important Message  Patient Details  Name: Xeng Kucher MRN: [id number] Date of Birth: 1945-07-03   Medicare Important Message Given:  Yes     Dannette Barbara 10/09/2021, 1:38 PM

## 2021-10-09 NOTE — Plan of Care (Signed)
  Problem: Education: Goal: Ability to describe self-care measures that may prevent or decrease complications (Diabetes Survival Skills Education) will improve Outcome: Progressing Goal: Individualized Educational Video(s) Outcome: Progressing   Problem: Coping: Goal: Ability to adjust to condition or change in health will improve Outcome: Progressing   Problem: Fluid Volume: Goal: Ability to maintain a balanced intake and output will improve Outcome: Progressing   Problem: Health Behavior/Discharge Planning: Goal: Ability to identify and utilize available resources and services will improve Outcome: Progressing Goal: Ability to manage health-related needs will improve Outcome: Progressing   Problem: Metabolic: Goal: Ability to maintain appropriate glucose levels will improve Outcome: Progressing   Problem: Nutritional: Goal: Maintenance of adequate nutrition will improve Outcome: Progressing Goal: Progress toward achieving an optimal weight will improve Outcome: Progressing   Problem: Skin Integrity: Goal: Risk for impaired skin integrity will decrease Outcome: Progressing   Problem: Tissue Perfusion: Goal: Adequacy of tissue perfusion will improve Outcome: Progressing   Problem: Education: Goal: Knowledge of General Education information will improve Description: Including pain rating scale, medication(s)/side effects and non-pharmacologic comfort measures Outcome: Progressing   Problem: Health Behavior/Discharge Planning: Goal: Ability to manage health-related needs will improve Outcome: Progressing   Problem: Clinical Measurements: Goal: Ability to maintain clinical measurements within normal limits will improve Outcome: Progressing Goal: Will remain free from infection Outcome: Progressing Goal: Diagnostic test results will improve Outcome: Progressing Goal: Respiratory complications will improve Outcome: Progressing Goal: Cardiovascular complication will  be avoided Outcome: Progressing   Problem: Activity: Goal: Risk for activity intolerance will decrease Outcome: Progressing   Problem: Nutrition: Goal: Adequate nutrition will be maintained Outcome: Progressing   Problem: Coping: Goal: Level of anxiety will decrease Outcome: Progressing   Problem: Elimination: Goal: Will not experience complications related to bowel motility Outcome: Progressing Goal: Will not experience complications related to urinary retention Outcome: Progressing   Problem: Pain Managment: Goal: General experience of comfort will improve Outcome: Progressing   Problem: Safety: Goal: Ability to remain free from injury will improve Outcome: Progressing   Problem: Skin Integrity: Goal: Risk for impaired skin integrity will decrease Outcome: Progressing   Problem: Education: Goal: Knowledge of disease or condition will improve Outcome: Progressing Goal: Knowledge of secondary prevention will improve (SELECT ALL) Outcome: Progressing Goal: Knowledge of patient specific risk factors will improve (INDIVIDUALIZE FOR PATIENT) Outcome: Progressing Goal: Individualized Educational Video(s) Outcome: Progressing   Problem: Coping: Goal: Will verbalize positive feelings about self Outcome: Progressing Goal: Will identify appropriate support needs Outcome: Progressing   Problem: Health Behavior/Discharge Planning: Goal: Ability to manage health-related needs will improve Outcome: Progressing   Problem: Self-Care: Goal: Ability to participate in self-care as condition permits will improve Outcome: Progressing Goal: Verbalization of feelings and concerns over difficulty with self-care will improve Outcome: Progressing Goal: Ability to communicate needs accurately will improve Outcome: Progressing   Problem: Nutrition: Goal: Risk of aspiration will decrease Outcome: Progressing Goal: Dietary intake will improve Outcome: Progressing   Problem:  Intracerebral Hemorrhage Tissue Perfusion: Goal: Complications of Intracerebral Hemorrhage will be minimized Outcome: Progressing   Problem: Ischemic Stroke/TIA Tissue Perfusion: Goal: Complications of ischemic stroke/TIA will be minimized Outcome: Progressing   Problem: Spontaneous Subarachnoid Hemorrhage Tissue Perfusion: Goal: Complications of Spontaneous Subarachnoid Hemorrhage will be minimized Outcome: Progressing

## 2021-10-09 NOTE — TOC Progression Note (Signed)
Transition of Care [facility]) - Progression Note    Patient Details  Name: Gerald Bauer MRN: [id number] Date of Birth: 1945/07/15  [facility] CM/SW [place], RN Phone Number: 10/09/2021, 1:48 PM  Clinical Narrative:   Planned transfer to [facility] in [place] today as per Lauren at facility.  Transfer scheduled for 1430-1500.         Expected Discharge Plan and Services           Expected Discharge Date: 10/09/21                                     Social Determinants of Health (SDOH) Interventions    Readmission Risk Interventions     No data to display

## 2021-10-09 NOTE — Progress Notes (Signed)
Newly admitted to the unit from [facility] and oriented to unit; able to follow commands. Oriented to assigned room. Assessments complete.

## 2021-10-09 NOTE — Progress Notes (Addendum)
Modified Barium Swallow Progress Note  Patient Details  Name: Gerald Bauer MRN: [id number] Date of Birth: 27-Feb-1946  Today's Date: 10/09/2021  Modified Barium Swallow completed.  Full report located under Chart Review in the Imaging Section.  Brief recommendations include the following:  Clinical Impression MBSS study completed this AM. Based on MBSS. Pt presents with a moderate oropharyngeal dysphagia. Oral phase notable for prolonged inefficient mastication of solids and mild oral residual with pureed and solid textures. Oral deficits likely secondary to lingual weakness/incoordination. Pharyngeal phase notable for swallow initiation at the level of the vallecula/pyriform sinuses, moderate-severe vallecular stasis and mild pyriform sinus stasis (with solid and pureed; increased residual with solid), transient during the swallow penetration with thin liquids via cup sip, transient during the swallow aspiration of thin liquids via cup when used as liquid wash, and audible during the swallow aspiration of thin liquids via straw sip. Pharyngeal deficit secondary to: decreased base of tongue retraction, mistimed/incomplete laryngeal vestibule closure, and reduced laryngeal elevation. Liquid wash was effective in reducing stasis with pureed, less effective with solids.   Recommend pureed diet with thin liquids with safe swallowing strategies/aspiration precautions as outlined below.   Follow up education completed with pt and daughter and daughter-in-law. Reviewed results of MBSS, diet recommendations, safe swallowing strategies/aspiration precautions, rationale for diet recommendations./safe swallowing strategies/aspiration precautions, and SLP POC. RN made aware of results, recommendations, and SLP POC. Signage in pt's room updated accordingly.  SLP to f/u per POC for diet tolerance, skilled meal observation for use of strategies, and dysphagia management. Continue SLP POC for aphasia/apraxia of  speech.   Pt would benefit from intensive SLP services at d/c given dysphagia and communication deficits in setting of stroke. Anticipate need for frequent/constant supervision/assistance at d/c.    Swallow Evaluation Recommendations       SLP Diet Recommendations: Dysphagia 1 (Puree) solids;Thin liquid   Liquid Administration via: No straw   Medication Administration: Whole meds with puree   Supervision: Patient able to self feed;Full supervision/cueing for compensatory strategies (with set up/support as needed)   Compensations: Slow rate;Small sips/bites;Minimize environmental distractions;Lingual sweep for clearance of pocketing   Postural Changes: Remain semi-upright after after feeds/meals (Comment);Seated upright at 90 degrees   Oral Care Recommendations: Oral care QID;Staff/trained caregiver to provide oral care (set up/support)       Cherrie Gauze, M.S., [facility] [phone] ([place])   Clearnce Sorrel Alece Koppel 10/09/2021,10:05 AM

## 2021-10-10 DIAGNOSIS — R4701 Aphasia: Secondary | ICD-10-CM

## 2021-10-10 DIAGNOSIS — K5900 Constipation, unspecified: Secondary | ICD-10-CM

## 2021-10-10 DIAGNOSIS — E119 Type 2 diabetes mellitus without complications: Secondary | ICD-10-CM

## 2021-10-10 LAB — CBC WITH DIFFERENTIAL/PLATELET
Abs Immature Granulocytes: 0.03 10*3/uL (ref 0.00–0.07)
Basophils Absolute: 0.1 10*3/uL (ref 0.0–0.1)
Basophils Relative: 1 %
Eosinophils Absolute: 0.2 10*3/uL (ref 0.0–0.5)
Eosinophils Relative: 3 %
HCT: 43.4 % (ref 39.0–52.0)
Hemoglobin: 15.1 g/dL (ref 13.0–17.0)
Immature Granulocytes: 0 %
Lymphocytes Relative: 25 %
Lymphs Abs: 1.8 10*3/uL (ref 0.7–4.0)
MCH: 30.6 pg (ref 26.0–34.0)
MCHC: 34.8 g/dL (ref 30.0–36.0)
MCV: 88 fL (ref 80.0–100.0)
Monocytes Absolute: 0.7 10*3/uL (ref 0.1–1.0)
Monocytes Relative: 9 %
Neutro Abs: 4.5 10*3/uL (ref 1.7–7.7)
Neutrophils Relative %: 62 %
Platelets: 212 10*3/uL (ref 150–400)
RBC: 4.93 MIL/uL (ref 4.22–5.81)
RDW: 12.2 % (ref 11.5–15.5)
WBC: 7.2 10*3/uL (ref 4.0–10.5)
nRBC: 0 % (ref 0.0–0.2)

## 2021-10-10 LAB — COMPREHENSIVE METABOLIC PANEL
ALT: 21 U/L (ref 0–44)
AST: 17 U/L (ref 15–41)
Albumin: 3.3 g/dL — ABNORMAL LOW (ref 3.5–5.0)
Alkaline Phosphatase: 50 U/L (ref 38–126)
Anion gap: 8 (ref 5–15)
BUN: 18 mg/dL (ref 8–23)
CO2: 26 mmol/L (ref 22–32)
Calcium: 8.5 mg/dL — ABNORMAL LOW (ref 8.9–10.3)
Chloride: 104 mmol/L (ref 98–111)
Creatinine, Ser: 0.83 mg/dL (ref 0.61–1.24)
GFR, Estimated: >60 mL/min (ref >60)
Glucose, Bld: 165 mg/dL — ABNORMAL HIGH (ref 70–99)
Potassium: 4.1 mmol/L (ref 3.5–5.1)
Sodium: 138 mmol/L (ref 135–145)
Total Bilirubin: 0.9 mg/dL (ref 0.3–1.2)
Total Protein: 6.5 g/dL (ref 6.5–8.1)

## 2021-10-10 LAB — GLUCOSE, CAPILLARY
Glucose-Capillary: 148 mg/dL — ABNORMAL HIGH (ref 70–99)
Glucose-Capillary: 225 mg/dL — ABNORMAL HIGH (ref 70–99)
Glucose-Capillary: 147 mg/dL — ABNORMAL HIGH (ref 70–99)
Glucose-Capillary: 262 mg/dL — ABNORMAL HIGH (ref 70–99)

## 2021-10-10 MED ORDER — MAGNESIUM GLUCONATE 500 MG PO TABS
250.0000 mg | ORAL_TABLET | Freq: Every day | ORAL | Status: DC
  Administered 2021-10-10 – 2021-10-20 (×11): 250 mg via ORAL
  Filled 2021-10-10 (×11): qty 1

## 2021-10-10 NOTE — Progress Notes (Signed)
Inpatient Rehabilitation  Patient information reviewed and entered into eRehab system by Yuki Brunsman M. Abas Leicht, M.A., CCC/SLP, PPS Coordinator.  Information including medical coding, functional ability and quality indicators will be reviewed and updated through discharge.    

## 2021-10-10 NOTE — Progress Notes (Signed)
Inpatient Rehabilitation Care Coordinator Assessment and Plan Patient Details  Name: Gerald Bauer MRN: [id number] Date of Birth: [birth year]  Today's Date: 10/10/2021  Hospital Problems: Principal Problem:   Left middle cerebral artery stroke [facility])  Past Medical History:  Past Medical History:  Diagnosis Date   Acid reflux    Arthritis    hands   Carotid atherosclerosis    Diabetes mellitus without complication ([place])    Hyperlipemia    Hypertension    Prostate cancer ([place])    history   Tachycardia    Past Surgical History:  Past Surgical History:  Procedure Laterality Date   [place]  2013   PROSTATECTOMY  2012   Social History:  reports that he quit smoking about 24 years ago. His smoking use included cigarettes. He has a 40.00 pack-year smoking history. He has quit using smokeless tobacco. He reports that he does not drink alcohol and does not use drugs.  Family / Support Systems Children: Darren Anticipated Caregiver: Land and other family Ability/Limitations of Caregiver: none Caregiver Availability: 24/7  Social History Preferred language: English Religion: Educational psychologist - How often do you need to have someone help you when you read instructions, pamphlets, or other written material from your doctor or pharmacy?: Patient unable to respond   Abuse/Neglect Abuse/Neglect Assessment Can Be Completed: Unable to assess, patient is non-responsive or altered mental status  Patient response to: Social Isolation - How often do you feel lonely or isolated from those around you?: Patient unable to respond  Emotional Status Recent Psychosocial Issues: coping Psychiatric History: n/a Substance Abuse History: n/a  Patient / Family Perceptions, Expectations & Goals Pt/Family understanding of illness & functional limitations: yes Premorbid pt/family roles/activities: Independent and driving Anticipated changes in  roles/activities/participation: patient will require assistance from patient son and other family members Pt/family expectations/goals: Supervision to Min/Mod [place]: None Premorbid Home Care/DME Agencies: None Transportation available at discharge: son able to transport Is the patient able to respond to transportation needs?: No In the past 12 months, has lack of transportation kept you from medical appointments or from getting medications?: No In the past 12 months, has lack of transportation kept you from meetings, work, or from getting things needed for daily living?: No  Discharge Planning Living Arrangements: Alone Support Systems: Children, Friends/neighbors Type of Residence: Private residence Insurance underwriter Resources: Multimedia programmer (specify) ([employer] MEDICARE) Financial Resources: Social Security Financial Screen Referred: No Living Expenses: Own Money Management: Patient Home Management: Previously independent Patient/Family Preliminary Plans: Son able to assist with medication managment Care Coordinator Barriers to Discharge: Insurance for SNF coverage, Decreased caregiver support, Lack of/limited family support Care Coordinator Anticipated Follow Up Needs: HH/OP Expected length of stay: 14-16 Days  Clinical Impression Sw spoke with patient son, Darren via telephone introduced self and explained role. Patient son serves as primary contact and primary caregiver. SW will follow up with patient son tomorrow to discuss progress, goals and anticipated discharge date. No additional questions or concerns.  Dyanne Iha 10/10/2021, 11:06 AM

## 2021-10-10 NOTE — Progress Notes (Signed)
PROGRESS NOTE   Subjective/Complaints: Denies any complaints this morning.  When asked he does note constipation- discussed starting magnesium supplement HS and he is agreeable  ROS: +constipation   Objective:   DG Swallowing Func-Speech Pathology  Result Date: 10/09/2021 Quintella Baton, CCC-SLP     10/09/2021 10:12 AM Objective Swallowing Evaluation: Type of Study: MBS-Modified Barium Swallow Study  Patient Details Name: Gerald Bauer MRN: [id number] Date of Birth: 04-Jul-1945 Today's Date: 10/09/2021 Time: SLP Start Time (ACUTE ONLY): 0815 -SLP Stop Time (ACUTE ONLY): 0840 SLP Time Calculation (min) (ACUTE ONLY): 25 min Past Medical History: Past Medical History: Diagnosis Date  Acid reflux   Arthritis   hands  Carotid atherosclerosis   Diabetes mellitus without complication ([place])   Hyperlipemia   Hypertension   Prostate cancer ([place])   history  Tachycardia  Past Surgical History: Past Surgical History: Procedure Laterality Date  APPENDECTOMY  1973  CHOLECYSTECTOMY  2013  PROSTATECTOMY  2012 HPI: Pt  is a 76 year old male with history of left-sided carotid stenosis, non-insulin-dependent diabetes mellitus type 2, PAD, GERD, mild COPD, history of tobacco use, hyperlipidemia, hypertension, who presents emergency department for chief concerns of facial droop, expressive aphasia, and R sided weakness.  MRI/MRA Head and Neck Imaging: MRI HEAD IMPRESSION:     1. 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 No associated hemorrhage or significant regional mass effect.  2. Loss of normal flow voids throughout the left ICA and MCA,  consistent with slow flow and/or occlusion. Susceptibility artifact  within proximal left MCA branches consistent with intraluminal  thrombus.  3. Underlying mild chronic microvascular ischemic disease. Tiny  remote left ACA distribution infarct.     MRA HEAD IMPRESSION:   "1. Interval near complete  occlusion of the left ICA and MCA,  presumably acute in nature given the presence of the acute left MCA  territory infarct. Left A1 segment not well seen either, also likely  occluded.  2. Otherwise wide patency of the major intracranial arterial  circulation. No other hemodynamically significant or correctable  stenosis.     MRA NECK IMPRESSION:  1. 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 Some attenuated flow seen  distally within the cervical left ICA, with subsequent reocclusion  by the skull base.  2. 50% atheromatous stenosis at the origin of the cervical right  ICA. Otherwise wide patency of the right carotid artery system.  3. Wide patency of the vertebral arteries within the neck.".  Subjective: Pt alert, pleasant, and cooperative. Mainly non-verbal with exception of a few automatic utterances. Communicating via gestural communication and facial expression.  Recommendations for follow up therapy are one component of a multi-disciplinary discharge planning process, led by the attending physician.  Recommendations may be updated based on patient status, additional functional criteria and insurance authorization. Assessment / Plan / Recommendation   10/09/2021   9:45 AM Clinical Impressions Clinical Impression Pt seen for MBSS. Pt alert, pleasant, and cooperative. Mainly non-verbal with exception of a few automatic utterances. Communicating via gestural communication and facial expressions. Pt presents with a moderate oropharyngeal dysphagia.  Oral phase notable for prolonged inefficient mastication of solids and mild oral residual with pureed and solid textures. Oral deficits likely secondary to lingual weakness/incoordination. Pharyngeal phase notable for swallow initiation at the level of the vallecula/pyriform sinuses, moderate-severe vallecular stasis and mild pyriform sinus stasis (with solid and pureed; increased residual with solid),  transient during the swallow penetration with thin liquids via cup sip, transient during the swallow aspiration of thin liquids via cup when used as liquid wash, and audible during the swallow aspiration of thin liquids via straw sip. Pharyngeal deficit secondary to: decreased base of tongue retraction, mistimed/incomplete laryngeal vestibule closure, and reduced laryngeal elevation. Liquid wash was effective in reducing residual pureed, less effective with solid. Recommend pureed diet with thin liquids with safe swallowing strategies/aspiration precautions as outlined below. Pt shown videofluoroscopic imaging following MBSS for education. Pt also made aware of results, recommendations, and SLP POC. Pt nodding in agreement, ?full understanding given aphasia. SLP to f/u with family.     10/09/2021   9:33 AM Treatment Recommendations Treatment Recommendations Therapy as outlined in treatment plan below     10/06/2021   3:00 PM Prognosis Prognosis for Safe Diet Advancement Good Barriers to Reach Goals Language deficits;Severity of deficits;Time post onset;Behavior Barriers/Prognosis Comment good Family support   10/09/2021   9:33 AM Diet Recommendations SLP Diet Recommendations Dysphagia 1 (Puree) solids;Thin liquid Liquid Administration via No straw Medication Administration Whole meds with puree Compensations Slow rate;Small sips/bites;Minimize environmental distractions;Lingual sweep for clearance of pocketing Postural Changes Remain semi-upright after after feeds/meals (Comment);Seated upright at 90 degrees     10/09/2021   9:33 AM Other Recommendations Oral Care Recommendations Oral care QID;Staff/trained caregiver to provide oral care Follow Up Recommendations Acute inpatient rehab (3hours/day) Assistance recommended at discharge Frequent or constant Supervision/Assistance Functional Status Assessment Patient has had a recent decline in their functional status and demonstrates the ability to make significant  improvements in function in a reasonable and predictable amount of time.   10/09/2021   9:33 AM Frequency and Duration  Speech Therapy Frequency (ACUTE ONLY) min 3x week Treatment Duration 2 weeks     10/09/2021   9:27 AM Oral Phase Oral Phase Impaired Oral - Thin Teaspoon WFL Oral - Thin Cup WFL Oral - Thin Straw WFL Oral - Puree Lingual/palatal residue Oral - Regular Impaired mastication;Weak lingual manipulation;Lingual/palatal residue;Delayed oral transit;Piecemeal swallowing    10/09/2021   9:29 AM Pharyngeal Phase Pharyngeal Phase Impaired Pharyngeal- Thin Teaspoon WFL;Delayed swallow initiation-pyriform sinuses Pharyngeal Material does not enter airway Pharyngeal- Thin Cup Delayed swallow initiation-pyriform sinuses;Penetration/Aspiration during swallow Pharyngeal Material enters airway, remains ABOVE vocal cords then ejected out;Material enters airway, passes BELOW cords then ejected out Pharyngeal- Thin Straw Delayed swallow initiation-pyriform sinuses;Penetration/Aspiration during swallow Pharyngeal Material enters airway, passes BELOW cords and not ejected out despite cough attempt by patient Pharyngeal- Puree Delayed swallow initiation-vallecula;Reduced tongue base retraction;Reduced laryngeal elevation;Pharyngeal residue - valleculae;Pharyngeal residue - pyriform Pharyngeal Material does not enter airway Pharyngeal- Regular Delayed swallow initiation-vallecula;Reduced tongue base retraction;Reduced laryngeal elevation;Pharyngeal residue - valleculae;Pharyngeal residue - pyriform Pharyngeal Material does not enter airway    10/09/2021   9:33 AM Cervical Esophageal Phase  Cervical Esophageal Phase [facility] Cherrie Gauze, M.S., [facility] [phone] [place]) Quintella Baton 10/09/2021, 9:56 AM                     Recent Labs    10/09/21 1538 10/10/21 0612  WBC 7.2 7.2  HGB 15.8 15.1  HCT 45.2 43.4  PLT 214 212   Recent Labs     10/09/21 1538 10/10/21 0612  NA  --  138  K  --  4.1  CL  --  104  CO2  --  26  GLUCOSE  --  165*  BUN  --  18  CREATININE 0.83 0.83  CALCIUM  --  8.5*    Intake/Output Summary (Last 24 hours) at 10/10/2021 1839 Last data filed at 10/10/2021 1320 Gross per 24 hour  Intake 480 ml  Output --  Net 480 ml        Physical Exam: Vital Signs Blood pressure (!) 125/93, pulse 89, temperature (!) 97.5 F (36.4 C), temperature source Oral, resp. rate 17, height '5\' 7"'$  (1.702 m), weight 74.5 kg, SpO2 97 %. Gen: no distress, normal appearing HEENT: oral mucosa pink and moist, NCAT Cardio: Reg rate Chest: normal effort, normal rate of breathing Abd: soft, non-distended Ext: no edema Psych: pleasant, normal affect Skin: intact Neurologic: Cranial nerves II through XII intact, motor strength is 5/5 in left deltoid, bicep, tricep, grip, hip flexor, knee extensors, ankle dorsiflexor and plantar flexor RUE 2- RLE 4/5 Sensory exam normal sensation to light touch and proprioception in bilateral upper and lower extremities   Assessment/Plan: 1. Functional deficits which require 3+ hours per day of interdisciplinary therapy in a comprehensive inpatient rehab setting. Physiatrist is providing close team supervision and 24 hour management of active medical problems listed below. Physiatrist and rehab team continue to assess barriers to discharge/monitor patient progress toward functional and medical goals  Care Tool:  Bathing    Body parts bathed by patient: Right arm, Chest, Abdomen, Right upper leg, Left upper leg, Face   Body parts bathed by helper: Left arm, Front perineal area, Buttocks, Right lower leg, Left lower leg     Bathing assist Assist Level: Moderate Assistance - Patient 50 - 74%     Upper Body Dressing/Undressing Upper body dressing   What is the patient wearing?: Pull over shirt    Upper body assist Assist Level: Moderate Assistance - Patient 50 - 74%    Lower Body  Dressing/Undressing Lower body dressing      What is the patient wearing?: Incontinence brief, Pants     Lower body assist Assist for lower body dressing: Maximal Assistance - Patient 25 - 49%     Toileting Toileting    Toileting assist Assist for toileting: Maximal Assistance - Patient 25 - 49%     Transfers Chair/bed transfer  Transfers assist     Chair/bed transfer assist level: Moderate Assistance - Patient 50 - 74% (stand pivot)     Locomotion Ambulation   Ambulation assist              Walk 10 feet activity   Assist           Walk 50 feet activity   Assist           Walk 150 feet activity   Assist           Walk 10 feet on uneven surface  activity   Assist           Wheelchair     Assist               Wheelchair 50 feet with 2 turns activity    Assist            Wheelchair 150 feet activity  Assist          Blood pressure (!) 125/93, pulse 89, temperature (!) 97.5 F (36.4 C), temperature source Oral, resp. rate 17, height '5\' 7"'$  (1.702 m), weight 74.5 kg, SpO2 97 %.  Medical Problem List and Plan: 1. Functional deficits secondary to moderate size left MCA distribution infarction with aphasia as well as right-sided weakness             -patient may  shower             -ELOS/Goals: 14-16d Min A  Admit to CIR 2.  Antithrombotics: -DVT/anticoagulation:  Pharmaceutical: Lovenox             -antiplatelet therapy: Aspirin 81 mg daily and Plavix 75 mg daily to continue indefinitely 3. Pain Management: Tylenol as needed 4. Mood/Behavior/Sleep: Provide emotional support             -antipsychotic agents: N/A 5. Neuropsych/cognition: This patient is not capable of making decisions on his own behalf. 6. Skin/Wound Care: Routine skin checks 7. Fluids/Electrolytes/Nutrition: Routine in and outs with follow-up chemistries 8.  Hypertension.  Norvasc 5 mg daily.  Monitor with increased mobility 9.   Diabetes mellitus.  Hemoglobin A1c 7.1.  Presently with SSI.  Patient maintained on Glucophage 500 mg twice daily prior to admission.  Resume as needed. Recommended choosing foods with low added sugar 10.  Hyperlipidemia.  Intolerant to statin.  Maintained on Repatha 140 mg subcutaneous every 2 weeks prior to admission. 11.  History of prostate cancer/prostatectomy 2024.  Follow-up outpatient 12. Hypoalbuminemia: recommended choosing foods high in protein and low in added sugar.  13. Constipation: ordered magnesium gluconate '250mg'$  HS.      LOS: 1 days A FACE TO FACE EVALUATION WAS PERFORMED  Clide Deutscher Amara Manalang 10/10/2021, 6:39 PM

## 2021-10-10 NOTE — Evaluation (Signed)
Occupational Therapy Assessment and Plan  Patient Details  Name: Gerald Bauer MRN: [id number] Date of Birth: April 23, 1945  OT Diagnosis: abnormal posture, acute pain, cognitive deficits, disturbance of vision, hemiplegia affecting dominant side, muscle weakness (generalized), and pain in joint Rehab Potential: Rehab Potential (ACUTE ONLY): Good ELOS: 2.5-3 weeks   Today's Date: 10/10/2021 OT Individual Time: 3710-6269 OT Individual Time Calculation (min): 70 min     Hospital Problem: Principal Problem:   Left middle cerebral artery stroke [facility])   Past Medical History:  Past Medical History:  Diagnosis Date   Acid reflux    Arthritis    hands   Carotid atherosclerosis    Diabetes mellitus without complication ([place])    Hyperlipemia    Hypertension    Prostate cancer ([place])    history   Tachycardia    Past Surgical History:  Past Surgical History:  Procedure Laterality Date   APPENDECTOMY  1973   CHOLECYSTECTOMY  2013   PROSTATECTOMY  2012    Assessment & Plan Clinical Impression:  Gerald Bauer is a 76 year old right-handed male with history of hypertension, hyperlipidemia, diabetes mellitus, prostate cancer/prostatectomy 2012, quit smoking 24 years ago.  Per chart review patient lives alone.  1 level home 5 steps to entry.  Independent community ambulator.  Independent ADLs and driving.  He is a retired Dealer.  He does have family who check on him routinely.  Presented to [facility] 10/05/2021 with right side weakness/facial droop and expressive aphasia.  Blood pressure 155/102.  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  No associated hemorrhage or significant mass effect.  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  Some attenuated flow seen distally within the cervical left ICA with subsequent reocclusion by the skull base.  50% atheromatous  stenosis of the origin of the cervical right ICA.  Wide patency of the vertebral arteries within the neck.  Admission chemistries unremarkable except glucose 119, urine drug screen negative, alcohol negative, hemoglobin A1c 7.1.  Echocardiogram with ejection fraction of 55 to 60% no wall motion abnormalities grade 1 diastolic dysfunction.  Neurology follow-up currently maintained on aspirin 81 mg daily and Plavix 75 mg daily for CVA prophylaxis indefinitely.  Lovenox added for DVT prophylaxis.  Maintained on dysphagia #1 nectar thick liquid diet.  Therapy evaluations completed due to patient decreased functional ability right-sided weakness and aphasia was admitted for a comprehensive rehab program. Patient transferred to CIR on 10/09/2021 .    Patient currently requires mod with basic self-care skills secondary to muscle weakness, decreased cardiorespiratoy endurance, abnormal tone, decreased coordination, and decreased motor planning, decreased visual perceptual skills and field cut, decreased attention to right, right side neglect, and decreased motor planning, decreased awareness, decreased problem solving, and decreased safety awareness, and decreased sitting balance, decreased standing balance, and hemiplegia.  Prior to hospitalization, patient could complete all self-care and functional ambulation with independence.  Patient will benefit from skilled intervention to decrease level of assist with basic self-care skills and increase independence with basic self-care skills prior to discharge home with care partner.  Anticipate patient will require 24 hour supervision and follow up home health.  OT - End of Session Activity Tolerance: Tolerates < 10 min activity, no significant change in vital signs Endurance Deficit: Yes Endurance Deficit Description: required rest breaks in between functional rest breaks OT Assessment Rehab Potential (ACUTE ONLY):  Good OT Barriers to Discharge: Home environment  access/layout;Incontinence OT Patient demonstrates impairments in the following area(s): Balance;Endurance;Pain;Motor;Perception;Safety;Vision;Cognition OT Basic ADL's Functional Problem(s): Grooming;Bathing;Dressing;Toileting OT Transfers Functional Problem(s): Toilet;Tub/Shower OT Additional Impairment(s): Fuctional Use of Upper Extremity OT Plan OT Intensity: Minimum of 1-2 x/day, 45 to 90 minutes OT Frequency: 5 out of 7 days OT Duration/Estimated Length of Stay: 2.5-3 weeks OT Treatment/Interventions: Balance/vestibular training;Discharge planning;Functional electrical stimulation;Pain management;Self Care/advanced ADL retraining;Therapeutic Activities;UE/LE Coordination activities;Cognitive remediation/compensation;Functional mobility training;Patient/family education;Therapeutic Exercise;Visual/perceptual remediation/compensation;DME/adaptive equipment instruction;Neuromuscular re-education;Splinting/orthotics;UE/LE Strength taining/ROM;Wheelchair propulsion/positioning OT Self Feeding Anticipated Outcome(s): Mod I OT Basic Self-Care Anticipated Outcome(s): Supervision/CGA OT Toileting Anticipated Outcome(s): Supervision/CGA OT Bathroom Transfers Anticipated Outcome(s): Supervision/CGA OT Recommendation Patient destination: Home Follow Up Recommendations: Home health OT Equipment Recommended: To be determined   OT Evaluation Precautions/Restrictions  Precautions Precautions: Fall Precaution Comments: R hemi, R neglect Restrictions Weight Bearing Restrictions: No Home Living/Prior Functioning Home Living Living Arrangements: Alone Available Help at Discharge: Family, Available 24 hours/day, Other (Comment) (Plans to discharge to son and daughter-in-law's home) Type of Home: House Home Access: Stairs to enter [employer] of Steps: pt's home with 5-6, DIL with 3-4 and rails on both sides Entrance Stairs-Rails: Right, Left, Can reach both Home Layout: One  level Bathroom Shower/Tub: Walk-in shower, Tub/shower unit, Door (DIL has walk in shower and tub/shower available) Bathroom Toilet: Standard (grab bars on each side of toilet) Bathroom Accessibility: Yes Additional Comments: pt's son & DIL live in 1 level 3 STE front B HRs (can reach both) - pt would D/C here if he needed 24hr support  Lives With: Alone IADL History Homemaking Responsibilities: Yes Homemaking Comments: DIL reports she plans to assist with meals and other IADLs at discharge Current License: Yes Mode of Transportation: Car Occupation: Retired Type of Occupation: retired Dealer Prior Function Level of Independence: Independent with gait, Independent with transfers, Independent with homemaking with ambulation  Able to Take Stairs?: Yes Driving: Yes Vocation: Retired Biomedical scientist: retired Dealer Leisure: Hobbies-yes (Comment) (enjoys modeling trains) Vision Baseline Vision/History: 1 Wears glasses (PRN) Ability to See in Adequate Light: 1 Impaired Patient Visual Report: Other (comment) (difficult to receive verbal report 2/2 aphasia) Vision Assessment?: Yes Eye Alignment: Impaired (comment) (R eye with lateral drift) Ocular Range of Motion: Restricted on the left;Restricted on the right Saccades: Decreased speed of saccadic movement Visual Fields: Right visual field deficit Depth Perception: Overshoots Perception  Perception: Impaired Inattention/Neglect: Does not attend to right visual field;Does not attend to right side of body Praxis Praxis: Impaired Praxis Impairment Details: Motor planning Cognition Cognition Overall Cognitive Status: Difficult to assess Arousal/Alertness: Lethargic Orientation Level: Nonverbal/unable to assess Memory: Appears intact Awareness: Impaired Problem Solving: Impaired Safety/Judgment: Impaired Comments: Difficult to assess 2/2 aphasia and communication barriers Brief Interview for Mental Status (BIMS) Repetition  of Three Words (First Attempt): No answer (unable to assess 2/2 aphasia, pt nonverbal during entire session) Temporal Orientation: Year: No answer Temporal Orientation: Month: No answer Temporal Orientation: Day: No answer Recall: "Sock": No answer Recall: "Blue": No answer Recall: "Bed": No answer BIMS Summary Score: 99 Sensation Sensation Light Touch: Appears Intact Hot/Cold: Not tested Proprioception: Impaired Detail Stereognosis: Not tested Coordination Gross Motor Movements are Fluid and Coordinated: No Fine Motor Movements are Fluid and Coordinated: No Coordination and Movement Description: impaired due to R hemiparesis Motor  Motor Motor: Hemiplegia  Trunk/Postural Assessment  Cervical Assessment Cervical Assessment: Within Functional Limits Thoracic Assessment Thoracic Assessment: Exceptions to [facility] (rounded shoulders) Lumbar Assessment Lumbar Assessment: Exceptions to [facility] (posterior pelvic tilt  in sitting) Postural Control Postural Control: Deficits on evaluation Trunk Control: required up to min A for sitting balance on BSC/sitting EOB with BLE unsupported  Balance Balance Balance Assessed: Yes Static Sitting Balance Static Sitting - Balance Support: Feet unsupported;No upper extremity supported Static Sitting - Level of Assistance: 4: Min assist Dynamic Sitting Balance Dynamic Sitting - Balance Support: Feet supported;Left upper extremity supported Dynamic Sitting - Level of Assistance: 5: Stand by assistance (CGA) Static Standing Balance Static Standing - Balance Support: During functional activity;Right upper extremity supported Static Standing - Level of Assistance: 4: Min assist Dynamic Standing Balance Dynamic Standing - Balance Support: During functional activity;Bilateral upper extremity supported Dynamic Standing - Level of Assistance: 3: Mod assist Extremity/Trunk Assessment RUE Assessment RUE Assessment: Exceptions to [facility] RUE AROM  (degrees) Overall AROM Right Upper Extremity: Deficits Right Composite Finger Flexion: 25% RUE PROM (degrees) Right Shoulder Flexion: 90 Degrees Right Wrist Extension: 35 Degrees Right Wrist Flexion: 45 Degrees Right Composite Finger Extension: 75% Right Composite Finger Flexion: 75% RUE Strength RUE Overall Strength: Deficits Right Shoulder Flexion: 2+/5 Right Elbow Flexion: 3/5 Right Elbow Extension: 3/5 Right Wrist Flexion: 2+/5 Right Wrist Extension: 2+/5 Right Hand Gross Grasp: Impaired RUE Tone RUE Tone: Mild LUE Assessment LUE Assessment: Within Functional Limits  Care Tool Care Tool Self Care Eating        Oral Care         Bathing   Body parts bathed by patient: Right arm;Chest;Abdomen;Right upper leg;Left upper leg;Face Body parts bathed by helper: Left arm;Front perineal area;Buttocks;Right lower leg;Left lower leg   Assist Level: Moderate Assistance - Patient 50 - 74%    Upper Body Dressing(including orthotics)   What is the patient wearing?: Pull over shirt   Assist Level: Moderate Assistance - Patient 50 - 74%    Lower Body Dressing (excluding footwear)   What is the patient wearing?: Incontinence brief;Pants Assist for lower body dressing: Maximal Assistance - Patient 25 - 49%    Putting on/Taking off footwear   What is the patient wearing?: Non-skid slipper socks Assist for footwear: Dependent - Patient 0%       Care Tool Toileting Toileting activity   Assist for toileting: Maximal Assistance - Patient 25 - 49%     Care Tool Bed Mobility Roll left and right activity        Sit to lying activity   Sit to lying assist level: Maximal Assistance - Patient 25 - 49%    Lying to sitting on side of bed activity         Care Tool Transfers Sit to stand transfer   Sit to stand assist level: Moderate Assistance - Patient 50 - 74%    Chair/bed transfer   Chair/bed transfer assist level: Moderate Assistance - Patient 50 - 74% (stand pivot)      Toilet transfer   Assist Level: Moderate Assistance - Patient 50 - 74% (stand pivot)     Care Tool Cognition  Expression of Ideas and Wants Expression of Ideas and Wants: 1. Rarely/Never expressess or very difficult - rarely/never expresses self or speech is very difficult to understand  Understanding Verbal and Non-Verbal Content Understanding Verbal and Non-Verbal Content: 2. Sometimes understands - understands only basic conversations or simple, direct phrases. Frequently requires cues to understand   Memory/Recall Ability Memory/Recall Ability : None of the above were recalled (nonverbal)   Refer to Care Plan for Long Term Goals  SHORT TERM GOAL WEEK 1 OT Short Term Goal  1 (Week 1): Pt will complete 1/3 toileting steps with no more than min A for standing balance OT Short Term Goal 2 (Week 1): Pt will thread BLE into pants with AE PRN with no more than CGA for sitting balance OT Short Term Goal 3 (Week 1): Pt with sit > stand in prep for ADL with min A using LRAD OT Short Term Goal 4 (Week 1): Pt will self-iniate RUE positioning for safety on 2 occurences with supervision  Recommendations for other services: None    Skilled Therapeutic Intervention Skilled OT intervention completed with discussion on POC, rehab goals and explanation of OT purpose. Pt received seated in recliner, with DIL present, pt agreeable to session. Pt remained nonverbal during session 2/2 expressive aphasia, however responded consistently with nodding head "yes/no" to questions. C/o pain in R shoulder at joint space, however declined intervention/meds at current time. Completed mod A sit > stand, mod A stand pivot with HHA to [facility] with facilitory cues needed for RLE advancement, with pt continent of urinary void only. Required CGA for sitting balance while seated on BSC. Mod A sit > stand and max A for toileting steps and mod A stand pivot to w/c. Seated at sink pt able to complete simulated LB bathing/dressing and  real time UB bathing/dressing as well as grooming tasks. See below/caretool for further details on assist level with self-care tasks performed. Request to return to bed, with mod A stand pivot, then agreeable to complete mass practice x5 sit <>stands at min A level with therapist providing HHA and blocking R knee for preventing knee buckling (however none noted). Max A needed for sit to supine. Overall pt demonstrates R side inattention/neglect, visual deficits with poor tracking, generalized/RUE weakness, balance, safety awareness. Pt remained semi-supine in bed, with RUE propped on pillow for hemi positioning, bed alarm on and all needs in reach at end of session.   ADL ADL Eating: Not assessed Grooming: Moderate assistance Where Assessed-Grooming: Sitting at sink Upper Body Bathing: Moderate assistance Where Assessed-Upper Body Bathing: Sitting at sink Lower Body Bathing: Moderate assistance Where Assessed-Lower Body Bathing: Sitting at sink (simulated) Upper Body Dressing: Moderate assistance Where Assessed-Upper Body Dressing: Sitting at sink Lower Body Dressing: Maximal assistance Where Assessed-Lower Body Dressing: Edge of bed Toileting: Maximal assistance Where Assessed-Toileting: Bedside Commode Toilet Transfer: Moderate assistance Toilet Transfer Method: Stand pivot Toilet Transfer Equipment: Extra wide bedside commode Tub/Shower Transfer: Unable to assess Tub/Shower Transfer Method: Unable to assess Social research officer, government: Unable to assess [employer] Transfer Method: Unable to assess Mobility  Bed Mobility Bed Mobility: Sit to Supine Sit to Supine: Maximal Assistance - Patient 25-49% Transfers Sit to Stand: Moderate Assistance - Patient 50-74% Stand to Sit: Moderate Assistance - Patient 50-74%   Discharge Criteria: Patient will be discharged from OT if patient refuses treatment 3 consecutive times without medical reason, if treatment goals not met, if there is a  change in medical status, if patient makes no progress towards goals or if patient is discharged from hospital.  The above assessment, treatment plan, treatment alternatives and goals were discussed and mutually agreed upon: by patient and by family  Blase Mess, MS, OTR/L  10/10/2021, 4:01 PM

## 2021-10-10 NOTE — Plan of Care (Signed)
  Problem: RH Swallowing Goal: LTG Patient will consume least restrictive diet using compensatory strategies with assistance (SLP) Description: LTG:  Patient will consume least restrictive diet using compensatory strategies with assistance (SLP) Flowsheets (Taken 10/10/2021 1800) LTG: Pt Patient will consume least restrictive diet using compensatory strategies with assistance of (SLP): Minimal Assistance - Patient > 75% Goal: LTG Pt will demonstrate functional change in swallow as evidenced by bedside/clinical objective assessment (SLP) Description: LTG: Patient will demonstrate functional change in swallow as evidenced by bedside/clinical objective assessment (SLP) Flowsheets (Taken 10/10/2021 1800) LTG: Patient will demonstrate functional change in swallow as evidenced by bedside/clinical objective assessment: Oropharyngeal swallow   Problem: RH Comprehension Communication Goal: LTG Patient will comprehend basic/complex auditory (SLP) Description: LTG: Patient will comprehend basic/complex auditory information with cues (SLP). Flowsheets (Taken 10/10/2021 1800) LTG: Patient will comprehend: Basic auditory information LTG: Patient will comprehend auditory information with cueing (SLP): Supervision   Problem: RH Expression Communication Goal: LTG Patient will express needs/wants via multi-modal(SLP) Description: LTG:  Patient will express needs/wants via multi-modal communication (gestures/written, etc) with cues (SLP) Flowsheets (Taken 10/10/2021 1800) LTG: Patient will express needs/wants via multimodal communication (gestures/written, etc) with cueing (SLP): Moderate Assistance - Patient 50 - 74% Goal: LTG Patient will verbally express basic/complex needs(SLP) Description: LTG:  Patient will verbally express basic/complex needs, wants or ideas with cues  (SLP) Flowsheets (Taken 10/10/2021 1800) LTG: Patient will verbally express basic/complex needs, wants or ideas (SLP): Maximal Assistance -  Patient 25 - 49%

## 2021-10-10 NOTE — Progress Notes (Signed)
[facility] Individual Statement of Services  Patient Name:  Gerald Bauer  Date:  10/10/2021  Welcome to the [facility].  Our goal is to provide you with an individualized program based on your diagnosis and situation, designed to meet your specific needs.  With this comprehensive rehabilitation program, you will be expected to participate in at least 3 hours of rehabilitation therapies Monday-Friday, with modified therapy programming on the weekends.  Your rehabilitation program will include the following services:  Physical Therapy (PT), Occupational Therapy (OT), Speech Therapy (ST), 24 hour per day rehabilitation nursing, Therapeutic Recreaction (TR), Neuropsychology, Care Coordinator, Rehabilitation Medicine, Nutrition Services, Pharmacy Services, and Other  Weekly team conferences will be held on Wednesdays to discuss your progress.  Your Inpatient Rehabilitation Care Coordinator will talk with you frequently to get your input and to update you on team discussions.  Team conferences with you and your family in attendance may also be held.  Expected length of stay: 14-16 Days  Overall anticipated outcome: Supervision to Min A  Depending on your progress and recovery, your program may change. Your Inpatient Rehabilitation Care Coordinator will coordinate services and will keep you informed of any changes. Your Inpatient Rehabilitation Care Coordinator's name and contact numbers are listed  below.  The following services may also be recommended but are not provided by the [place]:   [place] will be made to provide these services after discharge if needed.  Arrangements include referral to agencies that provide these services.  Your insurance has been verified to be:  [facility] Medicare Your primary doctor is:  [name], PA-C  Pertinent  information will be shared with your doctor and your insurance company.  Inpatient Rehabilitation Care Coordinator:  Erlene Quan, [place] or [phone]  Information discussed with and copy given to patient by: Dyanne Iha, 10/10/2021, 9:49 AM

## 2021-10-10 NOTE — Progress Notes (Signed)
Patient ID: Gerald Bauer, male   DOB: 26-Mar-1946, 76 y.o.   MRN: [id number] Met with the patient and daughter in law to review current situation, rehab process, team conference and plan of care. Reviewed secondary risks including DM, HTN, HLD. Reviewed DAPT per MD and dietary modification recommendations. Continue to follow along to discharge to address educational needs to faciltate preparation for discharge. Margarito Liner

## 2021-10-10 NOTE — Evaluation (Signed)
Speech Language Pathology Assessment and Plan  Patient Details  Name: Gerald Bauer MRN: [id number] Date of Birth: [birth year]  SLP Diagnosis: Aphasia;Dysphagia;Apraxia;Speech and Language deficits  Rehab Potential: Good ELOS: 2.5-3 weeks   Today's Date: 10/10/2021 SLP Individual Time: 1100-1205 SLP Individual Time Calculation (min): 65 min  Hospital Problem: Principal Problem:   Left middle cerebral artery stroke [facility])  Past Medical History:  Past Medical History:  Diagnosis Date   Acid reflux    Arthritis    hands   Carotid atherosclerosis    Diabetes mellitus without complication ([place])    Hyperlipemia    Hypertension    Prostate cancer ([place])    history   Tachycardia    Past Surgical History:  Past Surgical History:  Procedure Laterality Date   [place]  2013   PROSTATECTOMY  2012    Assessment / Plan / Recommendation Clinical Impression Patient presents with 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 Pt was able to initiate minimal voicing on command with low vocal intensity however this appeared quite effortful. Pt was also able to verbalize "nah" and approximate yeah in response to yes/no questions but required mod A verbal cues to verbalize response instead of primarily head nods. Pt did not have difficulty executing oral motor movements or following 1-step commands. Responses to yes/no questions were 80% accurate. Pt was able to select appropriate response in written field of 4 at the word level and utilized this effectively to select preferred drink choice. Pt attempted to write his name and son's name with ~25% spelling accuracy with poor legibility due to writing with non-dominant hand. The Western Aphasia Battery Bedside version (WAB-B) was administered.  Pt scored the following re: spontaneous speech content 0/10, fluency 0/10, auditory comp (yes/no) 8/10, following sequential  commands (10/10) repetition 0/10, object naming 0/10. Pt exhibited relative strengths in comprehension and reading skills. Anticipate pt will be stimulable for low-tech communication supports.   MBS obtained on 7/17 revealing a moderate orophayrngeal dysphagia. Pt exhibited transient during the swallow penetration with thin liquids via cup sip, transient during the swallow aspiration of thin liquids via cup when used as liquid wash, and audible during the swallow aspiration of thin liquids via straw sip. Pt was recommended a pureed diet with thin liquids. Per daughter-in-law at bedside, pt exhibited consistent coughing with evening meal yesterday evening which lead her to request nectar thick liquids where he reportedly exhibited minimal-to-no s/sx.   Per today's clinical swallow evaluation, pt exhibited immediate cough during 25% of thin liquid trials and no overt s/sx of aspiration with nectar thick liquids. Given aspiration events on swallow study and both reported and witnessed s/sx of aspiration with thin liquids, SLP recommends nectar thick liquids at this time. Will complete thin liquids trials with SLP and anticipate pt will be a good candidate for initiating water protocol soon. Pt tolerated pureed trials with mild residue on right corner of mouth and no overt pharyngeal symptoms, however pt indicated he felt sensation of pharyngeal residue which was effectively cleared with liquid rinse. Pt exhibited anterior labial spillage on right with all liquids.   SLP recommends skilled ST intervention to address language and swallowing deficits to increase independence and decrease care partner burden.   Skilled Therapeutic Interventions          Language and swallowing assessments completed. Please see above.  SLP Assessment  Patient will need skilled [facility]  during CIR admission    Recommendations  SLP Diet Recommendations: Nectar;Dysphagia 1 (Puree) Liquid Administration  via: Cup;No straw Medication Administration: Whole meds with puree Supervision: Full supervision/cueing for compensatory strategies Compensations: Slow rate;Small sips/bites;Minimize environmental distractions;Lingual sweep for clearance of pocketing Postural Changes and/or Swallow Maneuvers: Out of bed for meals;Seated upright 90 degrees;Upright 30-60 min after meal Oral Care Recommendations: Oral care QID Patient destination: Home Follow up Recommendations: 24 hour supervision/assistance;Home Health SLP;Outpatient SLP Equipment Recommended: To be determined    SLP Frequency 3 to 5 out of 7 days   SLP Duration  SLP Intensity  SLP Treatment/Interventions 2.5-3 weeks  Minumum of 1-2 x/day, 30 to 90 minutes  Multimodal communication approach;Speech/Language facilitation;Cueing hierarchy;Dysphagia/aspiration precaution training;Functional tasks;Patient/family education;Therapeutic Activities    Pain    Prior Functioning Cognitive/Linguistic Baseline: Within functional limits Type of Home: House  Lives With: Alone Available Help at Discharge: Family;Available 24 hours/day;Other (Comment) Vocation: Retired  Programmer, systems Overall Cognitive Status: Difficult to assess (2/2 expressive aphasia) Arousal/Alertness: Awake/alert Orientation Level: Oriented to person (difficult to assess d/t expressive aphasia) Attention: Sustained;Focused;Selective Focused Attention: Appears intact Sustained Attention: Appears intact Selective Attention: Appears intact Memory: Appears intact Awareness: Impaired Awareness Impairment: Intellectual impairment Problem Solving: Impaired Safety/Judgment: Impaired Comments: Difficult to assess 2/2 aphasia and communication barriers  Comprehension Auditory Comprehension Overall Auditory Comprehension: Impaired Yes/No Questions: Impaired Basic Biographical Questions: 76-100% accurate Basic Immediate Environment Questions: 75-100%  accurate Complex Questions: 50-74% accurate One Step Basic Commands: 75-100% accurate Two Step Basic Commands: 75-100% accurate Multistep Basic Commands: 50-74% accurate Conversation: Simple EffectiveTechniques: Extra processing time;Repetition Visual Recognition/Discrimination Common Objects: Able in field of 3 Reading Comprehension Reading Status:  (100% in field of 4 at word level) Sentence Level: Not tested Functional Environmental (signs, name badge): Not tested Expression Expression Primary Mode of Expression: Nonverbal - gestures Verbal Expression Overall Verbal Expression: Impaired Initiation: Impaired Automatic Speech:  (unable) Repetition: Impaired Level of Impairment: Word level Naming: Impairment Responsive: 0-25% accurate Confrontation: Impaired Common Objects: Able in field of 3 Non-Verbal Means of Communication: Gestures Written Expression Dominant Hand: Right Written Expression: Exceptions to [facility] Oral Motor Oral Motor/Sensory Function Overall Oral Motor/Sensory Function: Moderate impairment Facial ROM: Reduced right;Suspected CN VII (facial) dysfunction Facial Symmetry: Abnormal symmetry right;Suspected CN VII (facial) dysfunction Facial Strength: Reduced right;Suspected CN VII (facial) dysfunction Facial Sensation: Reduced right;Suspected CN V (Trigeminal) dysfunction Lingual ROM: Reduced right;Suspected CN XII (hypoglossal) dysfunction Lingual Symmetry: Abnormal symmetry right;Suspected CN XII (hypoglossal) dysfunction Lingual Strength: Reduced;Suspected CN XII (hypoglossal) dysfunction Lingual Sensation: Reduced;Suspected CN VII (facial) dysfunction-anterior 2/3 tongue Motor Speech Overall Motor Speech: Impaired Phonation: Low vocal intensity (difficult to initiate) Intelligibility: Unable to assess (comment) Motor Planning: Impaired Level of Impairment:  (phoneme)  Care Tool Care Tool Cognition Ability to hear (with hearing aid or hearing  appliances if normally used Ability to hear (with hearing aid or hearing appliances if normally used): 0. Adequate - no difficulty in normal conservation, social interaction, listening to TV   Expression of Ideas and Wants Expression of Ideas and Wants: 1. Rarely/Never expressess or very difficult - rarely/never expresses self or speech is very difficult to understand   Understanding Verbal and Non-Verbal Content Understanding Verbal and Non-Verbal Content: 3. Usually understands - understands most conversations, but misses some part/intent of message. Requires cues at times to understand  Memory/Recall Ability Memory/Recall Ability :  (Unable to formally assess due to expressive language deficits)   PMSV Assessment  PMSV Trial Intelligibility: Unable to assess (comment)  Bedside Swallowing  Assessment General Date of Onset: 10/05/21 Previous Swallow Assessment: MBS 7/17 Diet Prior to this Study: Dysphagia 1 (puree);Thin liquids Temperature Spikes Noted: No Respiratory Status: Room air History of Recent Intubation: No Behavior/Cognition: Alert;Cooperative;Pleasant mood Oral Cavity - Dentition: Adequate natural dentition Self-Feeding Abilities: Needs assist Patient Positioning: Upright in chair/Tumbleform Baseline Vocal Quality: Low vocal intensity Volitional Cough: Cognitively unable to elicit Volitional Swallow: Unable to elicit  Oral Care Assessment Oral Assessment  (WDL): Exceptions to WDL Lips: Asymmetrical Teeth: Intact Tongue: Pink;Moist Mucous Membrane(s): Moist;Pink Saliva: Moist, saliva free flowing Level of Consciousness: Alert Is patient on any of following O2 devices?: None of the above Nutritional status: Dysphagia Oral Assessment Risk : High Risk Ice Chips Ice chips: Not tested Thin Liquid Thin Liquid: Impaired Presentation: Cup Oral Phase Impairments: Reduced labial seal Oral Phase Functional Implications: Right anterior spillage Pharyngeal  Phase  Impairments: Suspected delayed Swallow;Cough - Immediate Nectar Thick Nectar Thick Liquid: Impaired Presentation: Cup Oral Phase Impairments: Reduced labial seal Oral phase functional implications: Right anterior spillage Honey Thick Honey Thick Liquid: Not tested Puree Puree: Impaired Presentation: Spoon Oral Phase Functional Implications: Oral residue Solid Solid: Not tested BSE Assessment Risk for Aspiration Impact on safety and function: Mild aspiration risk;Risk for inadequate nutrition/hydration Other Related Risk Factors: Deconditioning  Short Term Goals: Week 1: SLP Short Term Goal 1 (Week 1): Patient will consume current diet with minimal overt s/sx of aspiration and with min A for implementaiton of swallow precautions and strategies SLP Short Term Goal 2 (Week 1): Patient will consume thin liquid trials with minimal s/sx of aspiration with min A verbal cues for small, single sips to indicate readiness for liquid advancement SLP Short Term Goal 3 (Week 1): Patient will express wants/needs via multimodal communication with max A verbal/visual cues SLP Short Term Goal 4 (Week 1): Patient will approximate meaningful response during automatic speech sequences during 25% of opportunities with max A multimodal cues SLP Short Term Goal 5 (Week 1): Patient will approximate verbal repetition at the sound level during 25% of opportunities with max A multimodal cues SLP Short Term Goal 6 (Week 1): Pt will verbally respond to yes/no questions during 50% of opportunities with max A multimodal cues  Refer to Care Plan for Long Term Goals  Recommendations for other services: None   Discharge Criteria: Patient will be discharged from SLP if patient refuses treatment 3 consecutive times without medical reason, if treatment goals not met, if there is a change in medical status, if patient makes no progress towards goals or if patient is discharged from hospital.  The above assessment,  treatment plan, treatment alternatives and goals were discussed and mutually agreed upon: by patient and by family  Patty Sermons 10/10/2021, 6:02 PM

## 2021-10-10 NOTE — Plan of Care (Signed)
  Problem: RH Balance Goal: LTG Patient will maintain dynamic sitting balance (PT) Description: LTG:  Patient will maintain dynamic sitting balance with assistance during mobility activities (PT) Flowsheets (Taken 10/10/2021 2230) LTG: Pt will maintain dynamic sitting balance during mobility activities with:: Supervision/Verbal cueing Goal: LTG Patient will maintain dynamic standing balance (PT) Description: LTG:  Patient will maintain dynamic standing balance with assistance during mobility activities (PT) Flowsheets (Taken 10/10/2021 2230) LTG: Pt will maintain dynamic standing balance during mobility activities with:: Contact Guard/Touching assist   Problem: Sit to Stand Goal: LTG:  Patient will perform sit to stand with assistance level (PT) Description: LTG:  Patient will perform sit to stand with assistance level (PT) Flowsheets (Taken 10/10/2021 2230) LTG: PT will perform sit to stand in preparation for functional mobility with assistance level: Supervision/Verbal cueing   Problem: RH Bed Mobility Goal: LTG Patient will perform bed mobility with assist (PT) Description: LTG: Patient will perform bed mobility with assistance, with/without cues (PT). Flowsheets (Taken 10/10/2021 2230) LTG: Pt will perform bed mobility with assistance level of: Supervision/Verbal cueing   Problem: RH Bed to Chair Transfers Goal: LTG Patient will perform bed/chair transfers w/assist (PT) Description: LTG: Patient will perform bed to chair transfers with assistance (PT). Flowsheets (Taken 10/10/2021 2230) LTG: Pt will perform Bed to Chair Transfers with assistance level: Supervision/Verbal cueing   Problem: RH Car Transfers Goal: LTG Patient will perform car transfers with assist (PT) Description: LTG: Patient will perform car transfers with assistance (PT). Flowsheets (Taken 10/10/2021 2230) LTG: Pt will perform car transfers with assist:: Contact Guard/Touching assist   Problem: RH Ambulation Goal:  LTG Patient will ambulate in controlled environment (PT) Description: LTG: Patient will ambulate in a controlled environment, # of feet with assistance (PT). Flowsheets (Taken 10/10/2021 2230) LTG: Pt will ambulate in controlled environ  assist needed:: Contact Guard/Touching assist LTG: Ambulation distance in controlled environment: 18f using LRAD Goal: LTG Patient will ambulate in home environment (PT) Description: LTG: Patient will ambulate in home environment, # of feet with assistance (PT). Flowsheets (Taken 10/10/2021 2230) LTG: Pt will ambulate in home environ  assist needed:: Contact Guard/Touching assist LTG: Ambulation distance in home environment: [id number] LRAD   Problem: RH Stairs Goal: LTG Patient will ambulate up and down stairs w/assist (PT) Description: LTG: Patient will ambulate up and down # of stairs with assistance (PT) Flowsheets (Taken 10/10/2021 2230) LTG: Pt will ambulate up/down stairs assist needed:: Contact Guard/Touching assist LTG: Pt will  ambulate up and down number of stairs: 4 steps using HRs per home set-up

## 2021-10-10 NOTE — Evaluation (Signed)
Physical Therapy Assessment and Plan  Patient Details  Name: Gerald Bauer MRN: [id number] Date of Birth: 23-Apr-1945  PT Diagnosis: Abnormal posture, Abnormality of gait, Cognitive deficits, Difficulty walking, Hemiparesis dominant, Hypotonia, and Muscle weakness Rehab Potential: Good ELOS: 2.5-3 weeks   Today's Date: 10/10/2021 PT Individual Time: 0086-7619 PT Individual Time Calculation (min): 63 min    Hospital Problem: Principal Problem:   Left middle cerebral artery stroke [facility])   Past Medical History:  Past Medical History:  Diagnosis Date   Acid reflux    Arthritis    hands   Carotid atherosclerosis    Diabetes mellitus without complication ([place])    Hyperlipemia    Hypertension    Prostate cancer ([place])    history   Tachycardia    Past Surgical History:  Past Surgical History:  Procedure Laterality Date   APPENDECTOMY  1973   CHOLECYSTECTOMY  2013   PROSTATECTOMY  2012    Assessment & Plan Clinical Impression: Patient is a 76 y.o. year old  right-handed male with history of hypertension, hyperlipidemia, diabetes mellitus, prostate cancer/prostatectomy 2012, quit smoking 24 years ago.  Per chart review patient lives alone.  1 level home 5 steps to entry.  Independent community ambulator.  Independent ADLs and driving.  He is a retired Dealer.  He does have family who check on him routinely.  Presented to [facility] 10/05/2021 with right side weakness/facial droop and expressive aphasia.  Blood pressure 155/102.  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  No associated hemorrhage or significant mass effect.  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  Some attenuated flow seen distally within the cervical left ICA with subsequent reocclusion by the skull base.  50% atheromatous stenosis of the origin of the cervical right ICA.  Wide patency of  the vertebral arteries within the neck.  Admission chemistries unremarkable except glucose 119, urine drug screen negative, alcohol negative, hemoglobin A1c 7.1.  Echocardiogram with ejection fraction of 55 to 60% no wall motion abnormalities grade 1 diastolic dysfunction.  Neurology follow-up currently maintained on aspirin 81 mg daily and Plavix 75 mg daily for CVA prophylaxis indefinitely.  Lovenox added for DVT prophylaxis.  Maintained on dysphagia #1 nectar thick liquid diet.  Therapy evaluations completed due to patient decreased functional ability right-sided weakness and aphasia was admitted for a comprehensive rehab program. Patient transferred to CIR on 10/09/2021 .   Patient currently requires mod with mobility secondary to muscle weakness, muscle joint tightness, and muscle paralysis, decreased cardiorespiratoy endurance, impaired timing and sequencing, abnormal tone, unbalanced muscle activation, and decreased motor planning, decreased midline orientation and decreased attention to right, decreased attention, decreased awareness, decreased problem solving, decreased safety awareness, and delayed processing, and decreased sitting balance, decreased standing balance, decreased postural control, hemiplegia, and decreased balance strategies.  Prior to hospitalization, patient was independent  with mobility and lived with Alone in a House home.  Home access is pt's home with 5-6, DIL with 3-4 and rails on both sides .  Patient will benefit from skilled PT intervention to maximize safe functional mobility, minimize fall risk, and decrease caregiver burden for planned discharge home with 24 hour assist.  Anticipate patient will benefit from follow up OP at discharge.  PT - End of Session Activity Tolerance: Tolerates 30+ min activity with multiple rests Endurance Deficit: Yes Endurance Deficit Description: required rest  breaks in between functional mobility PT Assessment Rehab Potential (ACUTE/IP  ONLY): Good PT Barriers to Discharge: Inaccessible home environment;Nutrition means PT Patient demonstrates impairments in the following area(s): Balance;Motor;Safety;Behavior;Nutrition;Sensory;Edema;Pain;Skin Integrity;Endurance;Perception PT Transfers Functional Problem(s): Bed Mobility;Bed to Chair;Car;Furniture PT Locomotion Functional Problem(s): Ambulation;Wheelchair Mobility;Stairs PT Plan PT Intensity: Minimum of 1-2 x/day ,45 to 90 minutes PT Frequency: 5 out of 7 days PT Duration Estimated Length of Stay: 2.5-3 weeks PT Treatment/Interventions: Ambulation/gait training;Cognitive remediation/compensation;Discharge planning;DME/adaptive equipment instruction;Functional mobility training;Pain management;Psychosocial support;Splinting/orthotics;Therapeutic Activities;UE/LE Strength taining/ROM;Visual/perceptual remediation/compensation;Balance/vestibular training;Community reintegration;Disease management/prevention;Functional electrical stimulation;Neuromuscular re-education;Patient/family education;Skin care/wound management;Stair training;Therapeutic Exercise;UE/LE Coordination activities;Wheelchair propulsion/positioning PT Transfers Anticipated Outcome(s): supervision using LRAD PT Locomotion Anticipated Outcome(s): CGA using LRAD PT Recommendation Follow Up Recommendations: Outpatient PT;24 hour supervision/assistance Patient destination: Home Equipment Recommended: To be determined   PT Evaluation Precautions/Restrictions Precautions Precautions: Fall;Other (comment) Precaution Comments: R hemi, R inattention, aphasia, dysphagia Restrictions Weight Bearing Restrictions: No Pain Pain Assessment Pain Scale: 0-10 Pain Score: 0-No pain (nods head "no") Pain Interference Pain Interference Pain Effect on Sleep: 8. Unable to answer (due to aphasia) Pain Interference with Therapy Activities: 8. Unable to answer Pain Interference with Day-to-Day Activities: 8. Unable to  answer Home Living/Prior Functioning Home Living Available Help at Discharge: Family;Available 24 hours/day (son & daughter-in-law live 4min away from pt & then pt's older daughter lives in [place]) Type of Home: House Home Access: Stairs to enter Technical brewer of Steps: 5-6 Entrance Stairs-Rails: Right;Left;Can reach both Home Layout: One level Additional Comments: pt's son & DIL live in 1 level 3 STE front B HRs (can reach both) - pt would D/C here if he needed 24hr support  Lives With: Alone Prior Function Level of Independence: Independent with gait;Independent with transfers;Independent with homemaking with ambulation  Able to Take Stairs?: Yes Driving: Yes Vocation: Retired Biomedical scientist: retired Dealer Vision/Perception  Vision - History Ability to See in Adequate Light: 1 Impaired Perception Perception: Impaired Inattention/Neglect: Does not attend to right visual field;Does not attend to right side of body Praxis Praxis: Impaired Praxis Impairment Details: Motor planning  Cognition Overall Cognitive Status: Difficult to assess (aphasia with pt unable to vocalize a word during session) Arousal/Alertness: Awake/alert Orientation Level: Oriented to person (difficult to assess; attempted yes/no head nods but pt with difficulty doing this accurately at this time) Attention: Focused;Sustained;Selective Focused Attention: Appears intact Sustained Attention: Appears intact Selective Attention: Impaired Awareness: Impaired Problem Solving: Impaired Safety/Judgment: Impaired Sensation Sensation Light Touch: Appears Intact Hot/Cold: Not tested Proprioception: Impaired Detail (noted poor awareness of R LE positioning during gait and stair navigation) Proprioception Impaired Details: Impaired RLE Stereognosis: Not tested Coordination Gross Motor Movements are Fluid and Coordinated: No Coordination and Movement Description: impaired due to R  hemiparesis Motor  Motor Motor: Hemiplegia;Abnormal tone Motor - Skilled Clinical Observations: R hemiparesis (UE>LE)   Trunk/Postural Assessment  Cervical Assessment Cervical Assessment: Within Functional Limits Thoracic Assessment Thoracic Assessment: Exceptions to [facility] (rounded shoulders) Lumbar Assessment Lumbar Assessment: Exceptions to [facility] (posterior pelvic tilt in sitting) Postural Control Postural Control: Deficits on evaluation Trunk Control: requries mod assist for sitting balance EOB at beginning of session Protective Responses: delayed and inadequate  Balance Balance Balance Assessed: Yes Static Sitting Balance Static Sitting - Balance Support: Feet unsupported;No upper extremity supported Static Sitting - Level of Assistance: 3: Mod assist;4: Min assist Dynamic Sitting Balance Dynamic Sitting - Balance Support: Feet supported Dynamic Sitting - Level of Assistance: 3: Mod assist;2: Max assist Static Standing Balance Static Standing - Balance Support: During functional activity;Left upper  extremity supported Static Standing - Level of Assistance: 3: Mod assist Dynamic Standing Balance Dynamic Standing - Balance Support: During functional activity;Left upper extremity supported Dynamic Standing - Level of Assistance: 3: Mod assist;2: Max assist Extremity Assessment      RLE Assessment RLE Assessment: Exceptions to [facility] Passive Range of Motion (PROM) Comments: [facility] General Strength Comments: assessed in supine RLE Strength Right Hip Flexion: 4-/5 Right Knee Flexion: 4-/5 Right Knee Extension: 3+/5 Right Ankle Dorsiflexion: 4/5 Right Ankle Plantar Flexion: 4-/5 LLE Assessment LLE Assessment: Within Functional Limits Active Range of Motion (AROM) Comments: WFL General Strength Comments: assessed to be 5/5 in supine  Care Tool Care Tool Bed Mobility Roll left and right activity   Roll left and right assist level: Maximal Assistance - Patient 25 - 49%    Sit  to lying activity   Sit to lying assist level: Maximal Assistance - Patient 25 - 49%    Lying to sitting on side of bed activity   Lying to sitting on side of bed assist level: the ability to move from lying on the back to sitting on the side of the bed with no back support.: Maximal Assistance - Patient 25 - 49%     Care Tool Transfers Sit to stand transfer   Sit to stand assist level: Moderate Assistance - Patient 50 - 74%    Chair/bed transfer   Chair/bed transfer assist level: Moderate Assistance - Patient 50 - 74%     Physiological scientist transfer assist level: Moderate Assistance - Patient 50 - 74%      Care Tool Locomotion Ambulation   Assist level: 2 helpers Assistive device: Other (comment) (L hallway rail) Max distance: 24ft  Walk 10 feet activity   Assist level: 2 helpers Assistive device: Other (comment);Hand held assist (L hallway rail)   Walk 50 feet with 2 turns activity Walk 50 feet with 2 turns activity did not occur: Safety/medical concerns      Walk 150 feet activity Walk 150 feet activity did not occur: Safety/medical concerns      Walk 10 feet on uneven surfaces activity Walk 10 feet on uneven surfaces activity did not occur: Safety/medical concerns      Stairs   Assist level: Maximal Assistance - Patient 25 - 49% Stairs assistive device: 2 hand rails    Walk up/down 1 step activity   Walk up/down 1 step (curb) assist level: Maximal Assistance - Patient 25 - 49% Walk up/down 1 step or curb assistive device: 2 hand rails  Walk up/down 4 steps activity   Walk up/down 4 steps assist level: Maximal Assistance - Patient 25 - 49% Walk up/down 4 steps assistive device: 2 hand rails  Walk up/down 12 steps activity Walk up/down 12 steps activity did not occur: Safety/medical concerns      Pick up small objects from floor   Pick up small object from the floor assist level: Total Assistance - Patient < 25%    Wheelchair Is the  patient using a wheelchair?: Yes (only for transportation to/from gym - pt shows potential to reach functional ambulatory level)          Wheel 50 feet with 2 turns activity      Wheel 150 feet activity        Refer to Care Plan for Long Term Goals  SHORT TERM GOAL WEEK 1 PT Short Term Goal 1 (Week 1): Pt will  perform supine<>sit with mod assist of 1 PT Short Term Goal 2 (Week 1): Pt will perform sit<>stands with min assist of 1 PT Short Term Goal 3 (Week 1): Pt will perform bed<>chair transfers using LRAD with min assist of 1 PT Short Term Goal 4 (Week 1): Pt will ambulate at least 24ft using LRAD with min assist of 1 PT Short Term Goal 5 (Week 1): Pt will navigate 4 steps using HRs with mod assist of 1  Recommendations for other services: None   Skilled Therapeutic Intervention Pt received supine in bed with his daughter-in-law (DIL) present and pt appearing agreeable to therapy session. Throughout session pt would respond with either head nods or thumbs up. Evaluation completed (see details above) with patient education regarding purpose of PT evaluation, PT POC and goals, therapy schedule, weekly team meetings, and other CIR information including safety plan and fall risk safety. Pt completed the below functional mobility tasks with the specified levels of skilled cuing and assistance. Pt demonstrates R inattention with L gaze preference throughout session. Pt demonstrates more impaired trunk control in sitting than when in standing. At end of session, pt left seated in w/c with needs in reach, seat belt alarm on, and his DIL present.  Mobility Bed Mobility Bed Mobility: Sit to Supine;Supine to Sit Supine to Sit: Maximal Assistance - Patient - Patient 25-49% Sit to Supine: Maximal Assistance - Patient 25-49% Transfers Transfers: Sit to Stand;Stand to Sit;Stand Pivot Transfers Sit to Stand: Moderate Assistance - Patient 50-74% Stand to Sit: Moderate Assistance - Patient  50-74% Stand Pivot Transfers: Moderate Assistance - Patient 50 - 74% Stand Pivot Transfer Details: Tactile cues for sequencing;Tactile cues for weight shifting;Manual facilitation for weight shifting;Manual facilitation for placement;Verbal cues for technique;Verbal cues for sequencing;Verbal cues for gait pattern;Verbal cues for precautions/safety;Tactile cues for weight beaing;Tactile cues for placement Transfer (Assistive device): 1 person hand held assist Locomotion  Gait Ambulation: Yes Gait Assistance: 2 Helpers;Moderate Assistance - Patient 50-74% (mod A of 1 and +2 w/c follow) Gait Distance (Feet): 30 Feet Assistive device: Other (Comment) (hallway rail and R UE support over therapist's shoulders) Gait Assistance Details: Verbal cues for technique;Verbal cues for gait pattern;Verbal cues for sequencing;Tactile cues for sequencing;Tactile cues for weight shifting;Tactile cues for weight beaing;Tactile cues for placement;Manual facilitation for weight shifting;Manual facilitation for placement;Verbal cues for precautions/safety Gait Gait: Yes Gait Pattern: Impaired Gait Pattern: Poor foot clearance - right;Step-to pattern;Step-through pattern;Decreased step length - right;Decreased stride length;Decreased dorsiflexion - right;Decreased hip/knee flexion - right (poor R LE foot clearance, varyin step length to step-to vs reciprocal) Stairs / Additional Locomotion Stairs: Yes Stairs Assistance: Maximal Assistance - Patient 25 - 49% Stair Management Technique: Two rails;Alternating pattern;Step to pattern;Forwards (alternating on ascent with facilitation to lift R LE up onto next step and step-to on descent with pt demoing poor safety awareness leading with L LE on descent requiring therapist to block R knee buckle (no significant buckle noted though)) Number of Stairs: 4 Height of Stairs: 6 Wheelchair Mobility Wheelchair Mobility: No   Discharge Criteria: Patient will be discharged from  PT if patient refuses treatment 3 consecutive times without medical reason, if treatment goals not met, if there is a change in medical status, if patient makes no progress towards goals or if patient is discharged from hospital.  The above assessment, treatment plan, treatment alternatives and goals were discussed and mutually agreed upon: by patient and by family  Tawana Scale , PT, DPT, NCS, CSRS 10/10/2021, 7:52 AM

## 2021-10-11 LAB — GLUCOSE, CAPILLARY
Glucose-Capillary: 162 mg/dL — ABNORMAL HIGH (ref 70–99)
Glucose-Capillary: 190 mg/dL — ABNORMAL HIGH (ref 70–99)
Glucose-Capillary: 164 mg/dL — ABNORMAL HIGH (ref 70–99)
Glucose-Capillary: 253 mg/dL — ABNORMAL HIGH (ref 70–99)

## 2021-10-11 MED ORDER — METFORMIN HCL 500 MG PO TABS
500.0000 mg | ORAL_TABLET | Freq: Every day | ORAL | Status: DC
  Administered 2021-10-12: 500 mg via ORAL
  Filled 2021-10-11: qty 1

## 2021-10-11 NOTE — Progress Notes (Signed)
Speech Language Pathology Daily Session Note  Patient Details  Name: Gerald Bauer MRN: [id number] Date of Birth: 01-Oct-1945  Today's Date: 10/11/2021 SLP Individual Time: 1500-1530 SLP Individual Time Calculation (min): 30 min  Short Term Goals: Week 1: SLP Short Term Goal 1 (Week 1): Patient will consume current diet with minimal overt s/sx of aspiration and with min A for implementaiton of swallow precautions and strategies SLP Short Term Goal 2 (Week 1): Patient will consume thin liquid trials with minimal s/sx of aspiration with min A verbal cues for small, single sips to indicate readiness for liquid advancement SLP Short Term Goal 3 (Week 1): Patient will express wants/needs via multimodal communication with max A verbal/visual cues SLP Short Term Goal 4 (Week 1): Patient will approximate meaningful response during automatic speech sequences during 25% of opportunities with max A multimodal cues SLP Short Term Goal 5 (Week 1): Patient will approximate verbal repetition at the sound level during 25% of opportunities with max A multimodal cues SLP Short Term Goal 6 (Week 1): Pt will verbally respond to yes/no questions during 50% of opportunities with max A multimodal cues  Skilled Therapeutic Interventions:Skilled ST treatment focused on swallowing goals. Pt was received upright in wheelchair with son and DIL at bedside. Pt indicated interest to brush teeth at sink. Pt performed oral care with min A for sequencing. Following oral care, pt was agreeable to thin liquid water trials. Pt exhibited cough during 30% of occasions with 8oz. Pt demonstrated ability to take small, single sips. Pt appears appropriate for initiation of water protocol. Educated pt/family extensively regarding protocol and oral care measures. DIL verbalized understanding through teach back.   Pt agreeable to dys 2 PO trials. Pt consumes small bites with prolonged mastication and trace oral residuals post swallows. No  evidence of residue in R buccal cavity as anticipated. Pt exhibited no pharyngeal symptoms concerning for aspiration. Pt consumed sips of nectar thick liquids by cup without overt s/sx of aspiration. Recommend diet advancement to dysphagia 2 textures with nectar thick liquids. Cont pills whole in applesauce as tolerated. Pt and family in agreement. Family may provide supervision during meals following today's education. Signs updated in room to reflect diet advancement and initiation of water protocol.   Patient was left in wheelchair with alarm activated and immediate needs within reach at end of session. Continue per current plan of care.       Pain None/denied  Therapy/Group: Individual Therapy  Patty Sermons 10/11/2021, 4:16 PM

## 2021-10-11 NOTE — Patient Care Conference (Signed)
Inpatient RehabilitationTeam Conference and Plan of Care Update Date: 10/11/2021   Time: 11:31 AM    Patient Name: Gerald Bauer      Medical Record Number: [id number]  Date of Birth: 09-08-1945 Sex: Male         Room/Bed: 4W21C/4W21C-01 Payor Info: Payor: [employer] MEDICARE / Plan: [facility] MEDICARE / Product Type: *No Product type* /    Admit Date/Time:  10/09/2021  3:08 PM  Primary Diagnosis:  Left middle cerebral artery stroke [facility])  Hospital Problems: Principal Problem:   Left middle cerebral artery stroke [facility])    Expected Discharge Date: Expected Discharge Date: 10/31/21  Team Members Present: Physician leading conference: Dr. Leeroy Cha Social Worker Present: Erlene Quan, BSW Nurse Present: Dorien Chihuahua, RN PT Present: Ginnie Smart, PT OT Present: Jennefer Bravo, OT SLP Present: Weston Anna, SLP PPS Coordinator present : Gunnar Fusi, SLP     Current Status/Progress Goal Weekly Team Focus  Bowel/Bladder   pt. incontinent of B/B  LBM 10/08/21  Pt will regain normal B/B pattern  Toilet pt q3h and as neede   Swallow/Nutrition/ Hydration   dys 1 diet with nectar thick liquids, mod A at eval for monitoring labial spillage (had MBS and recommended for thin however exhibiting s/sx of aspiration with thin intake and minimal-to-no with nectar thick liquids - had sensed aspiration on MBS)  min A  current diet tolerance, thin liquid trials at bedside, likely initiate water protocol soon   ADL's   mod A bathing, mod A UB dressing, Max A LB dressing, max A toileting  Supervision/min A  functional endurance, safety awareness, RUE NMR, general strengthening, functional transfers   Mobility   max assist supine<>sit, mod assist sit<>stand and stand pivot without AD, mod assist 49f gait at L hallway rail with +2 w/c follow (moderate R knee giving way but advances during swing with intermittent min assist), max assist 4 stairs using B HRs - R inattention with  some decreased safety awareness  CGA/supervision overall at ambulatory level  activity tolerance, R hemibody NMR, bed mobility training, transfer training, gait training, pt/family education, sitting and standing balance   Communication   min A comprehension with additional time, max-to-total A verbal expression and multimodal communication - 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 Comprehension and reading appear to be relative strengths  sup A comprehension, mod A communication through multimodal means, max A for verbal expression  vocalizations/approximations, multimodal communication, stimulability for low-tech communication supports, pt/fam educationon aphasia   Safety/Cognition/ Behavioral Observations  not addressing to focus on language goals         Pain   Pain well managed with current pain medications  Pt wil be free from pain  Assess pt for pain qshiift and as needed   Skin   Pt skin is intact  Pt will maintain skin intergrity with no breakdown  Assess pt skin for breakdown qshift and as needed     Discharge Planning:  Discharge home with assistance from son 24/7   Team Discussion: Patient with aphasia, verbal apraxia, right inattention and poor safety awareness post Left MCA CVA. Patient working on calling for assistance and can be continent.. Able to read well and appears to understand but has difficulty with language and swallowing and maintaining focus.   Patient on target to meet rehab goals: yes, currently needs mod assist for upper body care and max assist for toileting and lower body care.  Needs max assist for sit - stand  without an assistive device and able to ambulate 61' with mod assist.. Has trouble with right knee buckling but can manage 4 steps with max assist and bil. Rails.  Goals for discharge set for CGA overall.  *See Care Plan and progress notes for long and short-term goals.   Revisions to Treatment Plan:  Resting hand splint  Teaching  Needs: Safety, medications,   Current Barriers to Discharge: Home enviroment access/layout  Possible Resolutions to Barriers: Family education DME: pending     Medical Summary Current Status: constipation, elevated CBGs secondary to type 2 diabetes, overweight, hypoalbuminemia, hyperlipidemia  Barriers to Discharge: Medical stability;Weight  Barriers to Discharge Comments: constipation, elevated CBGs secondary to type 2 diabetes, overweight, hypoalbuminemia, hyperlipidemia Possible Resolutions to [employer] Focus: add magnesium '250mg'$  HS, restart home metformin '500mg'$  daily and uptitrate as tolerated, provided dietary education, continue Repatha   Continued Need for Acute Rehabilitation Level of Care: The patient requires daily medical management by a physician with specialized training in physical medicine and rehabilitation for the following reasons: Direction of a multidisciplinary physical rehabilitation program to maximize functional independence : Yes Medical management of patient stability for increased activity during participation in an intensive rehabilitation regime.: Yes Analysis of laboratory values and/or radiology reports with any subsequent need for medication adjustment and/or medical intervention. : Yes   I attest that I was present, lead the team conference, and concur with the assessment and plan of the team.   Dorien Chihuahua B 10/11/2021, 2:37 PM

## 2021-10-11 NOTE — Progress Notes (Signed)
Patient ID: Gerald Bauer, male   DOB: [birth year], 76 y.o.   MRN: [id number] Team Conference Report to Patient/Family  Team Conference discussion was reviewed with the patient and caregiver, including goals, any changes in plan of care and target discharge date.  Patient and caregiver express understanding and are in agreement.  The patient has a target discharge date of 10/31/21.  Sw met with patient, son and DIL and provided team conference updates. SW will update patient family of reccommended items closer to discharge. Patient will discharge home with son and SIL to provide supervision. No additional questions or concerns, sw will continue to follow up.   Dyanne Iha 10/11/2021, 1:46 PM

## 2021-10-11 NOTE — Progress Notes (Signed)
Occupational Therapy Session Note  Patient Details  Name: Gerald Bauer MRN: [id number] Date of Birth: 29-Oct-1945  Today's Date: 10/11/2021 OT Individual Time: 0950-1030 & 1350-1445 OT Individual Time Calculation (min): 40 min & 55 min   Short Term Goals: Week 1:  OT Short Term Goal 1 (Week 1): Pt will complete 1/3 toileting steps with no more than min A for standing balance OT Short Term Goal 2 (Week 1): Pt will thread BLE into pants with AE PRN with no more than CGA for sitting balance OT Short Term Goal 3 (Week 1): Pt with sit > stand in prep for ADL with min A using LRAD OT Short Term Goal 4 (Week 1): Pt will self-iniate RUE positioning for safety on 2 occurences with supervision  Skilled Therapeutic Interventions/Progress Updates:  Session 1 Skilled OT intervention completed with focus on standing tolerance, R side NMR (RUE/RLE) and AAROM, visual perception skills. Pt received seated in w/c with DIL present. Pt remained nonverbal throughout session, but nodded head "yes/no" appropriately 100% of the time and indicated no pain during session. Transported pt dependently in w/c <> gym.   Mod A sit > stand with HHA, therapist blocking R knee. RUE placed on table for weight bearing, and min A needed for balance during high table top task to promote standing tolerance weight shifting to RLE. In stance, pt able complete foam lego task with initial mod difficulty, requiring cues for orienting pieces and selecting correct colors however on 2nd trial with linear pattern, able to complete accurately with min A. Facilitory/verbal cues needed for knee extension/activation in stance as pt with intermittent knee flexion demonstrating increased weakness with duration. Demonstrated great reach back prior to sitting method and descent control.  Seated in w/c, therapist completed light stretching to RUE for radial/ulnar wrist deviation, wrist ext/flex, and finger extension with min tone noted primarily in  digits with therapist requesting MD for resting hand splint for night time wear for contracture prevention and edema management.  With therapist guiding RUE, pt able to complete AAROM elbow flexion/extension x10 and shoulder flexion x10, with pt utilizing visual target on window for increased activation with pt able to complete about 35% of the way against gravity. Would benefit from [facility] in a gravity eliminated position for further NMR. Back in room, pt remained seated in w/c, with belt alarm on, RUE on half lap tray for hemi-positioning and all needs in reach at end of session with DIL present at pt's side.  Session 2 Skilled OT intervention completed with focus on functional transfers, dynamic balance, RUE NMR. Pt received seated upright in bed, no signs of pain/distress during session. Pt remained nonverbal however responded to yes/no questions with head nods 95% of the time.   Upon removal of covers, pt was found to be incontinent of urinary void, with pt unaware, however agreeable to try going to [facility] for further toileting. Min A needed for trunk elevation during bed mobility with HOB elevated, then initial mod A for sitting balance fading to CGA on EOB. Min A sit > stand with HHA then mod A stand pivot to [facility]. Pt able to manage brief doffing with min A for balance and assist on R side and min A for descent. Despite increased time, pt unable to void further, with CGA-supervision needed for sitting balance. Threaded brief/shorts with total A for time, min A sit > stand on R side, with pt able to manage L side of LB clothing with min A for  balance and therapist finishing rest of way on R side. +2 assist for switching BSC to w/c. Able to wash hands with hand over hand for R NMR. Transported pt dependently <> gym.    Remainder of session with focus on RUE AROM with visual target for increased activation of muscular structures, with use of cones for grasping, stacking/removing. Hand over hand assist  provided however with verbal cues, pt able to activate wrist extensors and bicep flexors with light stabilization support from therapist! Continues to have R neglect, with several safety cues needed for attending to RUE positioning. Pt remained seated in w/c, with family present and planning to stay til dinner time therefore safety belt left undonned. All immediate needs met at end of session.   Therapy Documentation Precautions:  Precautions Precautions: Fall, Other (comment) Precaution Comments: R hemi, R inattention, aphasia, dysphagia Restrictions Weight Bearing Restrictions: No    Therapy/Group: Individual Therapy  Blase Mess, MS, OTR/L  10/11/2021, 12:26 PM

## 2021-10-11 NOTE — IPOC Note (Signed)
Overall Plan of Care [facility]) Patient Details Name: Gerald Bauer MRN: [id number] DOB: 04-17-1945  Admitting Diagnosis: Left middle cerebral artery stroke [facility])  Hospital Problems: Principal Problem:   Left middle cerebral artery stroke [facility])     Functional Problem List: Nursing Bowel, Bladder, Endurance, Medication Management, Safety  PT Balance, Motor, Safety, Behavior, Nutrition, Sensory, Edema, Pain, Skin Integrity, Endurance, Perception  OT Balance, Endurance, Pain, Motor, Perception, Safety, Vision, Cognition  SLP Linguistic, Nutrition  TR         Basic ADL's: OT Grooming, Bathing, Dressing, Toileting     Advanced  ADL's: OT       Transfers: PT Bed Mobility, Bed to Chair, Car, Manufacturing systems engineer, Metallurgist: PT Ambulation, Emergency planning/management officer, Stairs     Additional Impairments: OT Fuctional Use of Upper Extremity  SLP Swallowing, Communication comprehension, expression    TR      Anticipated Outcomes Item Anticipated Outcome  Self Feeding Mod I  Swallowing  min A   Basic self-care  Supervision/CGA  Toileting  Supervision/CGA   Bathroom Transfers Supervision/CGA  Bowel/Bladder  manage bowel and bladder w toileting  Transfers  supervision using LRAD  Locomotion  CGA using LRAD  Communication  sup A comprehension, mod A multimodal communication  Cognition     Pain  n/a  Safety/Judgment  manage safety w cues   Therapy Plan: PT Intensity: Minimum of 1-2 x/day ,45 to 90 minutes PT Frequency: 5 out of 7 days PT Duration Estimated Length of Stay: 2.5-3 weeks OT Intensity: Minimum of 1-2 x/day, 45 to 90 minutes OT Frequency: 5 out of 7 days OT Duration/Estimated Length of Stay: 2.5-3 weeks SLP Intensity: Minumum of 1-2 x/day, 30 to 90 minutes SLP Frequency: 3 to 5 out of 7 days SLP Duration/Estimated Length of Stay: 2.5-3 weeks   Team Interventions: Nursing Interventions Bladder Management, Patient/Family Education, Bowel  Management, Medication Management, Discharge Planning, Disease Management/Prevention  PT interventions Ambulation/gait training, Cognitive remediation/compensation, Discharge planning, DME/adaptive equipment instruction, Functional mobility training, Pain management, Psychosocial support, Splinting/orthotics, Therapeutic Activities, UE/LE Strength taining/ROM, Visual/perceptual remediation/compensation, Training and development officer, Community reintegration, Disease management/prevention, Functional electrical stimulation, Neuromuscular re-education, Patient/family education, Skin care/wound management, Stair training, Therapeutic Exercise, UE/LE Coordination activities, Wheelchair propulsion/positioning  OT Interventions Training and development officer, Discharge planning, Functional electrical stimulation, Pain management, Self Care/advanced ADL retraining, Therapeutic Activities, UE/LE Coordination activities, Cognitive remediation/compensation, Functional mobility training, Patient/family education, Therapeutic Exercise, Visual/perceptual remediation/compensation, DME/adaptive equipment instruction, Neuromuscular re-education, Splinting/orthotics, UE/LE Strength taining/ROM, Wheelchair propulsion/positioning  SLP Interventions Multimodal communication approach, Speech/Language facilitation, Cueing hierarchy, Dysphagia/aspiration precaution training, Functional tasks, Patient/family education, Therapeutic Activities  TR Interventions    SW/CM Interventions Discharge Planning, Psychosocial Support, Patient/Family Education, Disease Management/Prevention   Barriers to Discharge MD  Medical stability  Nursing Decreased caregiver support, Home environment access/layout, Incontinence 1 level 5 ste bil rails solo, son's home 1 level 3 ste bil rails  PT Inaccessible home environment, Nutrition means    OT Home environment access/layout, Incontinence    SLP      SW Insurance for SNF coverage, Decreased  caregiver support, Lack of/limited family support     Team Discharge Planning: Destination: PT-Home ,OT- Home , SLP-Home Projected Follow-up: PT-Outpatient PT, 24 hour supervision/assistance, OT-  Home health OT, SLP-24 hour supervision/assistance, Home Health SLP, Outpatient SLP Projected Equipment Needs: PT-To be determined, OT- To be determined, SLP-To be determined Equipment Details: PT- , OT-  Patient/family involved in discharge planning: PT- Patient, Family member/caregiver,  OT-Patient, Family member/caregiver,  SLP-Patient, Family member/caregiver  MD ELOS: 14-16 days Medical Rehab Prognosis:  Excellent Assessment: The patient has been admitted for CIR therapies with the diagnosis of left MCA CVA. The team will be addressing functional mobility, strength, stamina, balance, safety, adaptive techniques and equipment, self-care, bowel and bladder mgt, patient and caregiver education. Goals have been set at [facility]. Anticipated discharge destination is home.         See Team Conference Notes for weekly updates to the plan of care

## 2021-10-11 NOTE — Progress Notes (Addendum)
Physical Therapy Session Note  Patient Details  Name: Gerald Bauer MRN: [id number] Date of Birth: [birth year]  Today's Date: 10/11/2021 PT Individual Time: 0800-0900 PT Individual Time Calculation (min): 60 min   Short Term Goals: Week 1:  PT Short Term Goal 1 (Week 1): Pt will perform supine<>sit with mod assist of 1 PT Short Term Goal 2 (Week 1): Pt will perform sit<>stands with min assist of 1 PT Short Term Goal 3 (Week 1): Pt will perform bed<>chair transfers using LRAD with min assist of 1 PT Short Term Goal 4 (Week 1): Pt will ambulate at least 35f using LRAD with min assist of 1 PT Short Term Goal 5 (Week 1): Pt will navigate 4 steps using HRs with mod assist of 1  Skilled Therapeutic Interventions/Progress Updates: Pt presents supine in bed w/ DIL present, agreeable to therapy as is finished w/ breakfast.  Pt assisted w/ donning pants threading over feet and then w/ manual assist to maintain R LE in hooklying position, able to bridge to pull pants over hips on Left, but mod A for completion.  Pt attempting to transfer sidelying to sit but noted brief soiled.  Pt returned to supine w/ mod A and performed rolling side to side to doff brief, perform pericare and then don clean brief/shorts w/ same assist.  Pt transfers sidelying to sit w/ max A although able to initiate and brings legs off EOB.  Pt and DIL states R shoulder weakness.  Pt sat EOB and then transfers squat pivot w/ mod A.  Pt scoots self back into chair w/ min A and verbal cues for forward lean.  Pt assisted w/ doffing slipper socks in Figure-4 position and donned socks after starting over toes.  Pt wheeled to main gm into // bars.  Pt performed multiple sit to stand transfers w/ hands on knees w/ even weight-bearing to B LE s.  Pt able to stand > 2' w/o UE support.  Pt returned to room and remained sitting in w/c w/ chair alarm on and all needs in reach.  DIL remained in room.  NT notified of incontinent episode.     Therapy  Documentation Precautions:  Precautions Precautions: Fall, Other (comment) Precaution Comments: R hemi, R inattention, aphasia, dysphagia Restrictions Weight Bearing Restrictions: No General:   Vital Signs: Therapy Vitals Temp: 98.7 F (37.1 C) Pulse Rate: 84 Resp: 16 BP: 135/86 Patient Position (if appropriate): Lying Oxygen Therapy SpO2: 96 % O2 Device: Room Air Pain:0/10 Pain Assessment Pain Scale: 0-10 Pain Score: 0-No pain     Therapy/Group: Individual Therapy  JLadoris Gene7/19/2023, 9:02 AM

## 2021-10-11 NOTE — Progress Notes (Addendum)
PROGRESS NOTE   Subjective/Complaints: No new complaints this morning Working with Merry Proud in the gym in the parallel bars Continues to be constipated but does not want medication  ROS: +constipation, denies pain   Objective:   No results found.  Recent Labs    10/09/21 1538 10/10/21 0612  WBC 7.2 7.2  HGB 15.8 15.1  HCT 45.2 43.4  PLT 214 212   Recent Labs    10/09/21 1538 10/10/21 0612  NA  --  138  K  --  4.1  CL  --  104  CO2  --  26  GLUCOSE  --  165*  BUN  --  18  CREATININE 0.83 0.83  CALCIUM  --  8.5*    Intake/Output Summary (Last 24 hours) at 10/11/2021 0943 Last data filed at 10/10/2021 1848 Gross per 24 hour  Intake 480 ml  Output --  Net 480 ml        Physical Exam: Vital Signs Blood pressure 135/86, pulse 84, temperature 98.7 F (37.1 C), resp. rate 16, height '5\' 7"'$  (1.702 m), weight 74.5 kg, SpO2 96 %. Gen: no distress, normal appearing, overweight BMI 25.72 HEENT: oral mucosa pink and moist, NCAT Cardio: Reg rate Chest: normal effort, normal rate of breathing Abd: soft, non-distended Ext: no edema Psych: pleasant, normal affect Skin: intact Neurologic: Cranial nerves II through XII intact, motor strength is 5/5 in left deltoid, bicep, tricep, grip, hip flexor, knee extensors, ankle dorsiflexor and plantar flexor RUE 2- RLE 4/5 Sensory exam normal sensation to light touch and proprioception in bilateral upper and lower extremities Right knee buckling with ambulation MaxA 4 stairs Decreased insight into deficits Expressive aphasia but responds very well to yes/no questions   Assessment/Plan: 1. Functional deficits which require 3+ hours per day of interdisciplinary therapy in a comprehensive inpatient rehab setting. Physiatrist is providing close team supervision and 24 hour management of active medical problems listed below. Physiatrist and rehab team continue to assess barriers  to discharge/monitor patient progress toward functional and medical goals  Care Tool:  Bathing    Body parts bathed by patient: Right arm, Chest, Abdomen, Right upper leg, Left upper leg, Face   Body parts bathed by helper: Left arm, Front perineal area, Buttocks, Right lower leg, Left lower leg     Bathing assist Assist Level: Moderate Assistance - Patient 50 - 74%     Upper Body Dressing/Undressing Upper body dressing   What is the patient wearing?: Pull over shirt    Upper body assist Assist Level: Moderate Assistance - Patient 50 - 74%    Lower Body Dressing/Undressing Lower body dressing      What is the patient wearing?: Incontinence brief, Pants     Lower body assist Assist for lower body dressing: Maximal Assistance - Patient 25 - 49%     Toileting Toileting    Toileting assist Assist for toileting: Maximal Assistance - Patient 25 - 49%     Transfers Chair/bed transfer  Transfers assist     Chair/bed transfer assist level: Moderate Assistance - Patient 50 - 74%     Locomotion Ambulation   Ambulation assist  Assist level: 2 helpers Assistive device: Other (comment) (L hallway rail) Max distance: 17f   Walk 10 feet activity   Assist     Assist level: 2 helpers Assistive device: Other (comment), Hand held assist (L hallway rail)   Walk 50 feet activity   Assist Walk 50 feet with 2 turns activity did not occur: Safety/medical concerns         Walk 150 feet activity   Assist Walk 150 feet activity did not occur: Safety/medical concerns         Walk 10 feet on uneven surface  activity   Assist Walk 10 feet on uneven surfaces activity did not occur: Safety/medical concerns         Wheelchair     Assist Is the patient using a wheelchair?: Yes (only for transportation to/from gym - pt shows potential to reach functional ambulatory level)             Wheelchair 50 feet with 2 turns activity    Assist             Wheelchair 150 feet activity     Assist          Blood pressure 135/86, pulse 84, temperature 98.7 F (37.1 C), resp. rate 16, height '5\' 7"'$  (1.702 m), weight 74.5 kg, SpO2 96 %.  Medical Problem List and Plan: 1. Functional deficits secondary to moderate size left MCA distribution infarction with aphasia as well as right-sided weakness             -patient may  shower             -ELOS/Goals: 14-16d Min A  Continue CIR 2.  Antithrombotics: -DVT/anticoagulation:  Pharmaceutical: Lovenox             -antiplatelet therapy: Aspirin 81 mg daily and Plavix 75 mg daily to continue indefinitely 3. Pain Management: Tylenol as needed 4. Mood/Behavior/Sleep: Provide emotional support             -antipsychotic agents: N/A 5. Neuropsych/cognition: This patient is not capable of making decisions on his own behalf. 6. Skin/Wound Care: Routine skin checks 7. Fluids/Electrolytes/Nutrition: Routine in and outs with follow-up chemistries 8.  Hypertension.  Norvasc 5 mg daily.  Monitor with increased mobility 9.  Diabetes mellitus.  Hemoglobin A1c 7.1.  Presently with SSI.  Patient maintained on Restart metformin '500mg'$  daily. Recommended choosing foods with low added sugar 10.  Hyperlipidemia.  Intolerant to statin.  Maintained on Repatha 140 mg subcutaneous every 2 weeks prior to admission. 11.  History of prostate cancer/prostatectomy 2024.  Follow-up outpatient 12. Hypoalbuminemia: recommended choosing foods high in protein and low in added sugar.  13. Constipation: continue magnesium gluconate '250mg'$  HS. Provide list of high fiber foods 14. Overweight BMI 25.72: provide list of foods to assist with weight loss 15. Expressive aphasia: continue SLP 16. 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

## 2021-10-12 LAB — GLUCOSE, CAPILLARY
Glucose-Capillary: 168 mg/dL — ABNORMAL HIGH (ref 70–99)
Glucose-Capillary: 196 mg/dL — ABNORMAL HIGH (ref 70–99)
Glucose-Capillary: 199 mg/dL — ABNORMAL HIGH (ref 70–99)
Glucose-Capillary: 209 mg/dL — ABNORMAL HIGH (ref 70–99)

## 2021-10-12 LAB — SARS CORONAVIRUS 2 BY RT PCR: SARS Coronavirus 2 by RT PCR: NEGATIVE

## 2021-10-12 MED ORDER — METFORMIN HCL 500 MG PO TABS
500.0000 mg | ORAL_TABLET | Freq: Two times a day (BID) | ORAL | Status: DC
  Administered 2021-10-12 – 2021-10-15 (×7): 500 mg via ORAL
  Filled 2021-10-12 (×7): qty 1

## 2021-10-12 MED ORDER — MAGNESIUM HYDROXIDE 400 MG/5ML PO SUSP
30.0000 mL | Freq: Once | ORAL | Status: AC
  Administered 2021-10-12: 30 mL via ORAL
  Filled 2021-10-12: qty 30

## 2021-10-12 NOTE — Progress Notes (Signed)
PROGRESS NOTE   Subjective/Complaints: Still constipated- agreeable to milk of mag Discussed elevated CBGs and titrating up metformin back to home dose, provided list of foods that are good for diabetes  ROS: +constipation, denies pain   Objective:   No results found.  Recent Labs    10/09/21 1538 10/10/21 0612  WBC 7.2 7.2  HGB 15.8 15.1  HCT 45.2 43.4  PLT 214 212   Recent Labs    10/09/21 1538 10/10/21 0612  NA  --  138  K  --  4.1  CL  --  104  CO2  --  26  GLUCOSE  --  165*  BUN  --  18  CREATININE 0.83 0.83  CALCIUM  --  8.5*    Intake/Output Summary (Last 24 hours) at 10/12/2021 1036 Last data filed at 10/12/2021 0845 Gross per 24 hour  Intake 360 ml  Output --  Net 360 ml        Physical Exam: Vital Signs Blood pressure 137/81, pulse 80, temperature 97.6 F (36.4 C), resp. rate 16, height '5\' 7"'$  (1.702 m), weight 74.5 kg, SpO2 98 %. Gen: no distress, normal appearing, overweight BMI 25.72 HEENT: oral mucosa pink and moist, NCAT Cardio: Reg rate Chest: normal effort, normal rate of breathing Abd: soft, non-distended Ext: no edema Psych: pleasant, normal affect Skin: intact Neurologic: Cranial nerves II through XII intact, motor strength is 5/5 in left deltoid, bicep, tricep, grip, hip flexor, knee extensors, ankle dorsiflexor and plantar flexor RUE 2- RLE 4/5 Sensory exam normal sensation to light touch and proprioception in bilateral upper and lower extremities Right knee buckling with ambulation MaxA 4 stairs Decreased insight into deficits Expressive aphasia but responds very well to yes/no questions via head nods and shakes   Assessment/Plan: 1. Functional deficits which require 3+ hours per day of interdisciplinary therapy in a comprehensive inpatient rehab setting. Physiatrist is providing close team supervision and 24 hour management of active medical problems listed  below. Physiatrist and rehab team continue to assess barriers to discharge/monitor patient progress toward functional and medical goals  Care Tool:  Bathing    Body parts bathed by patient: Right arm, Chest, Abdomen, Right upper leg, Left upper leg, Face   Body parts bathed by helper: Left arm, Front perineal area, Buttocks, Right lower leg, Left lower leg     Bathing assist Assist Level: Moderate Assistance - Patient 50 - 74%     Upper Body Dressing/Undressing Upper body dressing   What is the patient wearing?: Pull over shirt    Upper body assist Assist Level: Moderate Assistance - Patient 50 - 74%    Lower Body Dressing/Undressing Lower body dressing      What is the patient wearing?: Incontinence brief, Pants     Lower body assist Assist for lower body dressing: Maximal Assistance - Patient 25 - 49%     Toileting Toileting    Toileting assist Assist for toileting: Maximal Assistance - Patient 25 - 49%     Transfers Chair/bed transfer  Transfers assist     Chair/bed transfer assist level: Moderate Assistance - Patient 50 - 74%     Locomotion Ambulation  Ambulation assist      Assist level: 2 helpers Assistive device: Other (comment) (L hallway rail) Max distance: 104f   Walk 10 feet activity   Assist     Assist level: 2 helpers Assistive device: Other (comment), Hand held assist (L hallway rail)   Walk 50 feet activity   Assist Walk 50 feet with 2 turns activity did not occur: Safety/medical concerns         Walk 150 feet activity   Assist Walk 150 feet activity did not occur: Safety/medical concerns         Walk 10 feet on uneven surface  activity   Assist Walk 10 feet on uneven surfaces activity did not occur: Safety/medical concerns         Wheelchair     Assist Is the patient using a wheelchair?: Yes Type of Wheelchair: Manual    Wheelchair assist level: Supervision/Verbal cueing Max wheelchair distance:  636f   Wheelchair 50 feet with 2 turns activity    Assist        Assist Level: Supervision/Verbal cueing   Wheelchair 150 feet activity     Assist          Blood pressure 137/81, pulse 80, temperature 97.6 F (36.4 C), resp. rate 16, height '5\' 7"'$  (1.702 m), weight 74.5 kg, SpO2 98 %.  Medical Problem List and Plan: 1. 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  Antithrombotics: -DVT/anticoagulation:  Pharmaceutical: Lovenox             -antiplatelet therapy: Aspirin 81 mg daily and Plavix 75 mg daily to continue indefinitely 3. Pain Management: Tylenol as needed 4. Mood/Behavior/Sleep: Provide emotional support             -antipsychotic agents: N/A 5. Neuropsych/cognition: This patient is not capable of making decisions on his own behalf. 6. Skin/Wound Care: Routine skin checks 7. Fluids/Electrolytes/Nutrition: Routine in and outs with follow-up chemistries 8.  Hypertension.  Norvasc 5 mg daily.  Monitor with increased mobility 9.  Diabetes mellitus.  Hemoglobin A1c 7.1.  Presently with SSI.  Patient maintained on Increase metformin to '500mg'$  BID Recommended choosing foods with low added sugar 10.  Hyperlipidemia.  Intolerant to statin. Continue Repatha 140 mg subcutaneous every 2 weeks prior to admission. 11.  History of prostate cancer/prostatectomy 2024.  Follow-up outpatient 12. Hypoalbuminemia: recommended choosing foods high in protein and low in added sugar.  13. Constipation: continue magnesium gluconate '250mg'$  HS. Provide list of high fiber foods. Milk of mag ordered for today 14. Overweight BMI 25.72: provide list of foods to assist with weight loss 15. Expressive aphasia: continue SLP 16. 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

## 2021-10-12 NOTE — Progress Notes (Signed)
Occupational Therapy Session Note  Patient Details  Name: Gerald Bauer MRN: [id number] Date of Birth: 19-Jul-1945  Today's Date: 10/12/2021 OT Individual Time: 7353-2992 & 1050-1130 OT Individual Time Calculation (min): 55 min & 40 min   Short Term Goals: Week 1:  OT Short Term Goal 1 (Week 1): Pt will complete 1/3 toileting steps with no more than min A for standing balance OT Short Term Goal 2 (Week 1): Pt will thread BLE into pants with AE PRN with no more than CGA for sitting balance OT Short Term Goal 3 (Week 1): Pt with sit > stand in prep for ADL with min A using LRAD OT Short Term Goal 4 (Week 1): Pt will self-iniate RUE positioning for safety on 2 occurences with supervision  Skilled Therapeutic Interventions/Progress Updates:  Session 1 Skilled OT intervention completed with focus on RUE/RLE NMR within a self-feeding and WB/stabilizing context. Pt received seated in w/c, self-feeding with LUE. No c/o pain throughout session, with pt remaining nonverbal however responsive via head nods for "yes/no." Therapist provided education on areas of OT intervention involved with feeding when utilizing the RUE for NMR. Therapist issued pt built up handle and foam block (yellow) to promote increased gripping with feeding when with family member who are cleared to assist with feeding. With universal cuff, therapist assisted pt with self feeding with pt demonstrating intermittent active wrist extension upon scooping however with inability to maintain sustained extension for hand to mouth despite tapping as facilitory cue. Though AROM available at bicep flexors and shoulder flexors, pt required stabilization support at the wrist, forearm and elbow for efficiency for reaching to plate as well as hand to mouth. Might benefit from NMES along with feeding or further intervention at a gravity eliminated position to decrease bicep/shoulder fatigue. Min A sit > stand with HHA, then pt able to maintain balance  standing at sink with min A and R hand on sink for NMR with hand over hand assist provided. In stance, pt retrieved/re-placed squigz on mirror in PNF pattern, to promote weight shifting needed for ADL tasks. Therapist blocking at R knee intermittently with multimodal cues needed for knee extension as with fatigue pt increases knee flexion however no buckling noted. Pt remained seated in w/c, with RUE on half lap tray on for hemi-positioning, DIL present and all needs in reach at end of session.  Session 2 Skilled OT intervention completed with focus on R side NMR in stance, as well as cog/sequencing skills. Pt received seated in w/c, no signs of distress or pain, with pt nodding "no" to if pain however "yes" to fatigue. Transported in w/c dependently <> gym for time. At high low table pt completed the following in stance with min A for sit > stand with HHA, min A for balance throughout and therapist with intermittent blocking on R knee with subsequent facilitory cues for activating knee extension throughout: -Assembling/taking down pipe tower x2 trials, with therapist providing hand over hand assist for when pt self-initiated placing pipe piece in R hand  Seated at table, pt completed slide peg board, to promote sequencing and cog skills needed for ADL management. Pt with initial max difficulty, however fading to min A for completing task with most difficulty with selecting correct color and coordinating moving pieces out of the way to place correct one in the sequence.  Back in room, pt was left seated in w/c, with RUE on half lap tray on for hemi-positioning, DIL present and all needs in  reach at end of session.   Therapy Documentation Precautions:  Precautions Precautions: Fall, Other (comment) Precaution Comments: R hemi, R inattention, aphasia, dysphagia Restrictions Weight Bearing Restrictions: No    Therapy/Group: Individual Therapy  Blase Mess, MS, OTR/L  10/12/2021, 12:21 PM

## 2021-10-12 NOTE — Progress Notes (Signed)
Physical Therapy Session Note  Patient Details  Name: Gerald Bauer MRN: [id number] Date of Birth: November 20, 1945  Today's Date: 10/12/2021 PT Individual Time: 0731-0826 PT Individual Time Calculation (min): 55 min   Short Term Goals: Week 1:  PT Short Term Goal 1 (Week 1): Pt will perform supine<>sit with mod assist of 1 PT Short Term Goal 2 (Week 1): Pt will perform sit<>stands with min assist of 1 PT Short Term Goal 3 (Week 1): Pt will perform bed<>chair transfers using LRAD with min assist of 1 PT Short Term Goal 4 (Week 1): Pt will ambulate at least 26f using LRAD with min assist of 1 PT Short Term Goal 5 (Week 1): Pt will navigate 4 steps using HRs with mod assist of 1  Skilled Therapeutic Interventions/Progress Updates:   Received pt semi-reclined in bed with DIL present at bedside. Pt agreeable to PT treatment and denied any pain during session - communicates via head nods. Session with emphasis on dressing, toileting, functional mobility/transfers, generalized strengthening and endurance, and standing balance. Pt transferred semi-reclined<>sitting R EOB with mod A for trunk control on R but able to advance LEs off EOB without assist. Pt nodded "yes" to urge to toilet and transferred bed<>WC stand/squat<>pivot to L with mod A. Pt transferred on/off bedside commode over toilet stand/squat<>pivot with mod A and required mod A for clothing management. Put able to void but no BM - [facility] himself back without therapist - educated pt on fall/safety concerns and importance of waiting for therapist. Donned shorts seated in WM[place]with max A and stood with min A using grab bar to pull over hips (mod A on R side). [place] in [place] and washed face with supervision and cues for hand over hand technique on R to promote functional use of RUE. Doffed dirty shirt and donned new one with mod A and donned socks and shoes with max A for time management  purposes. Pt performed WC mobility ~676fusing BLE and LUE and supervision - cues for propulsion technique and transported remainder of way to dayroom dependently for time management purposes. Pt transferred on/off Nustep via stand<>pivot with min/mod A and performed seated BLE strengthening on Nustep at workload 4 for 8 minutes for a total of 298 steps with emphasis on cardiovascular endurance, reciprocal movement training, and generalized strengthening. Replaced current WC cushion for more comfortable one - pt stood with L handrail and min A while therapist switched cushions. Concluded session with pt sitting in WC, needs within reach, and seatbelt alarm on. Of note, pt required cues for R attention throughout session - reminders to locate RUE, as it was frequently hanging off the side of WC.   Therapy Documentation Precautions:  Precautions Precautions: Fall, Other (comment) Precaution Comments: R hemi, R inattention, aphasia, dysphagia Restrictions Weight Bearing Restrictions: No  Therapy/Group: Individual Therapy AnAlfonse AlpersT, DPT  10/12/2021, 6:52 AM

## 2021-10-12 NOTE — Progress Notes (Signed)
Orthopedic Tech Progress Note Patient Details:  Gerald Bauer 1945-04-23 [id number] Ordered resting hand splint from [facility]  Patient ID: [name], male   DOB: 08-Sep-1945, 76 y.o.   MRN: [id number]  [name] 10/12/2021, 10:04 AM

## 2021-10-12 NOTE — Progress Notes (Signed)
Speech Language Pathology Daily Session Note  Patient Details  Name: Gerald Bauer MRN: [id number] Date of Birth: 07-Jul-1945  Today's Date: 10/12/2021 SLP Individual Time: 1130-1200 SLP Individual Time Calculation (min): 30 min  Short Term Goals: Week 1: SLP Short Term Goal 1 (Week 1): Patient will consume current diet with minimal overt s/sx of aspiration and with min A for implementaiton of swallow precautions and strategies SLP Short Term Goal 2 (Week 1): Patient will consume thin liquid trials with minimal s/sx of aspiration with min A verbal cues for small, single sips to indicate readiness for liquid advancement SLP Short Term Goal 3 (Week 1): Patient will express wants/needs via multimodal communication with max A verbal/visual cues SLP Short Term Goal 4 (Week 1): Patient will approximate meaningful response during automatic speech sequences during 25% of opportunities with max A multimodal cues SLP Short Term Goal 5 (Week 1): Patient will approximate verbal repetition at the sound level during 25% of opportunities with max A multimodal cues SLP Short Term Goal 6 (Week 1): Pt will verbally respond to yes/no questions during 50% of opportunities with max A multimodal cues  Skilled Therapeutic Interventions: Skilled ST treatment focused on swallowing and language goals. Pt was accompanied by daughter in law who reported pt had completed oral care prior to session. SLP provided with thin water (water protocol) with immediate cough during 1/5 presentations. Pt consumed small, single sips with sup A verbal cues, and intermittent cues to increase wait time between sips.   SLP facilitated the following language tasks: vocalization with sustained "ah" x5 with low vocal intensity and without observable difficulty initiating; pt was unable to produce other vowel sounds despite obvious attempts and max A multimodal cues; phoneme production of /m/ x1, and approximation of /b/ x2. Voiced sounds are  most challenging at this time due to substantial difficulty voicing.   Pt produced automatic speech sequence: verbally counted 1-2 with whisper like expression; unable to initiate during subsequent attempts or additional numbers.   Pt responded to basic yes/no questions with less accuracy today as compared to initial evaluation - ~50% upon initial attempt, progressing to ~75% with additional time and repetition of question. Pt requiring max A multimodal cues to verbalize "no" x4, with minimal ability to verbalize "yeah" on command and during natural contexts. SLP encouraged pt to attempt to verbally respond to yes/no questions as increased practice. DIL stated she will help provide encouragement and reminders.   Patient was left in wheelchair with alarm activated and immediate needs within reach at end of session. Continue per current plan of care.      Pain None/denied    Therapy/Group: Individual Therapy  Nicanor Mendolia T Rishi Vicario 10/12/2021, 12:00 PM

## 2021-10-13 LAB — GLUCOSE, CAPILLARY
Glucose-Capillary: 167 mg/dL — ABNORMAL HIGH (ref 70–99)
Glucose-Capillary: 201 mg/dL — ABNORMAL HIGH (ref 70–99)
Glucose-Capillary: 177 mg/dL — ABNORMAL HIGH (ref 70–99)
Glucose-Capillary: 126 mg/dL — ABNORMAL HIGH (ref 70–99)

## 2021-10-13 MED ORDER — SORBITOL 70 % SOLN
30.0000 mL | Freq: Once | Status: AC
  Administered 2021-10-13: 30 mL via ORAL
  Filled 2021-10-13: qty 30

## 2021-10-13 MED ORDER — DICLOFENAC SODIUM 1 % EX GEL
2.0000 g | Freq: Four times a day (QID) | CUTANEOUS | Status: DC
  Administered 2021-10-13 – 2021-10-21 (×19): 2 g via TOPICAL
  Filled 2021-10-13: qty 100

## 2021-10-13 NOTE — Progress Notes (Signed)
PROGRESS NOTE   Subjective/Complaints: Still with constipation, sorbitol ordered Has shoulder arthritis pain, voltaren gel ordered Daughter in law and patient have no other questions  ROS: +constipation, denies pain   Objective:   No results found.  No results for input(s): "WBC", "HGB", "HCT", "PLT" in the last 72 hours.  No results for input(s): "NA", "K", "CL", "CO2", "GLUCOSE", "BUN", "CREATININE", "CALCIUM" in the last 72 hours.   Intake/Output Summary (Last 24 hours) at 10/13/2021 1006 Last data filed at 10/12/2021 1842 Gross per 24 hour  Intake 120 ml  Output --  Net 120 ml        Physical Exam: Vital Signs Blood pressure (!) 146/79, pulse 81, temperature 98.2 F (36.8 C), resp. rate 18, height '5\' 7"'$  (1.702 m), weight 74.5 kg, SpO2 97 %. Gen: no distress, normal appearing, overweight BMI 25.72 HEENT: oral mucosa pink and moist, NCAT Cardio: Reg rate Chest: normal effort, normal rate of breathing Abd: soft, non-distended Ext: no edema Psych: pleasant, normal affect Skin: intact Neurologic: Cranial nerves II through XII intact, motor strength is 5/5 in left deltoid, bicep, tricep, grip, hip flexor, knee extensors, ankle dorsiflexor and plantar flexor RUE 2- RLE 4/5 Sensory exam normal sensation to light touch and proprioception in bilateral upper and lower extremities Right knee buckling with ambulation MaxA 4 stairs Decreased insight into deficits Expressive aphasia but responds very well to yes/no questions via head nods and shakes Can count 1-2   Assessment/Plan: 1. Functional deficits which require 3+ hours per day of interdisciplinary therapy in a comprehensive inpatient rehab setting. Physiatrist is providing close team supervision and 24 hour management of active medical problems listed below. Physiatrist and rehab team continue to assess barriers to discharge/monitor patient progress toward  functional and medical goals  Care Tool:  Bathing    Body parts bathed by patient: Right arm, Chest, Abdomen, Right upper leg, Left upper leg, Face   Body parts bathed by helper: Left arm, Front perineal area, Buttocks, Right lower leg, Left lower leg     Bathing assist Assist Level: Moderate Assistance - Patient 50 - 74%     Upper Body Dressing/Undressing Upper body dressing   What is the patient wearing?: Pull over shirt    Upper body assist Assist Level: Moderate Assistance - Patient 50 - 74%    Lower Body Dressing/Undressing Lower body dressing      What is the patient wearing?: Incontinence brief, Pants     Lower body assist Assist for lower body dressing: Maximal Assistance - Patient 25 - 49%     Toileting Toileting    Toileting assist Assist for toileting: Maximal Assistance - Patient 25 - 49%     Transfers Chair/bed transfer  Transfers assist     Chair/bed transfer assist level: Moderate Assistance - Patient 50 - 74%     Locomotion Ambulation   Ambulation assist      Assist level: 2 helpers Assistive device: Other (comment) (L hallway rail) Max distance: 71f   Walk 10 feet activity   Assist     Assist level: 2 helpers Assistive device: Other (comment), Hand held assist (L hallway rail)   Walk 50 feet  activity   Assist Walk 50 feet with 2 turns activity did not occur: Safety/medical concerns         Walk 150 feet activity   Assist Walk 150 feet activity did not occur: Safety/medical concerns         Walk 10 feet on uneven surface  activity   Assist Walk 10 feet on uneven surfaces activity did not occur: Safety/medical concerns         Wheelchair     Assist Is the patient using a wheelchair?: Yes Type of Wheelchair: Manual    Wheelchair assist level: Supervision/Verbal cueing Max wheelchair distance: 71f    Wheelchair 50 feet with 2 turns activity    Assist        Assist Level:  Supervision/Verbal cueing   Wheelchair 150 feet activity     Assist      Assist Level: Dependent - Patient 0% (based on PT documentation that w/c mobility was used for transportation only)   Blood pressure (!) 146/79, pulse 81, temperature 98.2 F (36.8 C), resp. rate 18, height '5\' 7"'$  (1.702 m), weight 74.5 kg, SpO2 97 %.  Medical Problem List and Plan: 1. 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  Antithrombotics: -DVT/anticoagulation:  Pharmaceutical: Lovenox             -antiplatelet therapy: Aspirin 81 mg daily and Plavix 75 mg daily to continue indefinitely 3. Shoulder arthritis: voltaren gel ordered. Tylenol as needed 4. Mood/Behavior/Sleep: Provide emotional support             -antipsychotic agents: N/A 5. Neuropsych/cognition: This patient is not capable of making decisions on his own behalf. 6. Skin/Wound Care: Routine skin checks 7. Fluids/Electrolytes/Nutrition: Routine in and outs with follow-up chemistries 8.  Hypertension.  Norvasc 5 mg daily.  Monitor with increased mobility 9.  Diabetes mellitus.  Hemoglobin A1c 7.1.  Presently with SSI.  Patient maintained on Increase metformin to '500mg'$  BID Recommended choosing foods with low added sugar 10.  Hyperlipidemia.  Intolerant to statin. Continue Repatha 140 mg subcutaneous every 2 weeks prior to admission. 11.  History of prostate cancer/prostatectomy 2024.  Follow-up outpatient 12. Hypoalbuminemia: recommended choosing foods high in protein and low in added sugar.  13. Constipation: continue magnesium gluconate '250mg'$  HS. Provide dietary education. No BM with milk of mag, sorbitol ordered for today 14. Overweight BMI 25.72: provide dietary education 15. Expressive aphasia: continue SLP 16.  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

## 2021-10-13 NOTE — Progress Notes (Signed)
Occupational Therapy Session Note  Patient Details  Name: Gerald Bauer MRN: [id number] Date of Birth: 06/18/1945  Today's Date: 10/13/2021 OT Individual Time: 0805-0900 & 1420-1500 OT Individual Time Calculation (min): 55 min & 40 min   Short Term Goals: Week 1:  OT Short Term Goal 1 (Week 1): Pt will complete 1/3 toileting steps with no more than min A for standing balance OT Short Term Goal 2 (Week 1): Pt will thread BLE into pants with AE PRN with no more than CGA for sitting balance OT Short Term Goal 3 (Week 1): Pt with sit > stand in prep for ADL with min A using LRAD OT Short Term Goal 4 (Week 1): Pt will self-iniate RUE positioning for safety on 2 occurences with supervision  Skilled Therapeutic Interventions/Progress Updates:  Session 1 Skilled OT intervention completed with focus on ADL retraining. Pt received supine in bed, with nursing present providing meds. Pt indicated pain in L shoulder (at baseline for arthritis) however declined intervention. Hanger delivered resting hand splint, with therapist ensuring fit and made minor adjustments to thumb region 2/2 limited ROM at baseline compared to L hand. Education provided to DIL about donning/doffing instructions with pt demonstrating ability to doff with supervision/cues. With cues for efficiency, pt transitioned via hook lying to EOB with min A for trunk elevation, then cues for scooting towards EOB. Min A sit > stand with HHA then min A stand pivot to w/c with R knee flexion noted however no significant buckling therefore did not block. Seated at sink, pt completed face/UB washing with increased use of R hand noted and AROM available, with pt able to lift with shoulder flexors enough for self-application of washing arm pits/deo with supervision this session. Doffed/donned new shirt with education provided regarding hemi-technique, overall min A this session! Donned pants with mod A, using figure 4 position, with hemi-technique as  well, however therapist having to stabilize RLE during and assist on R side in stance. Pt remained seated at sink with DIL present to assist pt with hair grooming at end of session.  Session 2 Skilled OT intervention completed with focus on AAROM of RUE. Pt received seated in w/c, with nurse present stating pt resulted with abnormal VS with elevated HR/BP. Per nursing, clear to continue with therapy however to monitor closely and modify activities therefore all tasks completed seated this session for BP management. Pt with no c/o pain. Transported pt dependently in w/c <> gym.  Seated at low table, pt completed the following to promote functional return of pt's RUE for ADL tasks: -Arm skate AAROM 2x10 including shoulder flexion, external rotation and horizontal abd/adduction with use of visual target for increased activation; light stabilization needed -Retrieving/stacking cones with hand over hand assist provided; multimodal cues/muscle tapping for wrist extension and bicep flexion for increased AROM for technique  Education provided self-ROM using hemi-technique, for x10 shoulder flexion, elbow flex/ext, wrist flex/ext, with cues needed for safety to prevent height of R arm higher than head to maintain shoulder integrity. Plan to provide HEP on further self-ROM for completion in between therapies. Back in room, pt remained seated in w/c with DIL present and all immediate needs met at end of session.   Therapy Documentation Precautions:  Precautions Precautions: Fall, Other (comment) Precaution Comments: R hemi, R inattention, aphasia, dysphagia Restrictions Weight Bearing Restrictions: No    Therapy/Group: Individual Therapy  Blase Mess, MS, OTR/L  10/13/2021, 3:57 PM

## 2021-10-13 NOTE — Progress Notes (Signed)
Speech Language Pathology Daily Session Note  Patient Details  Name: Gerald Bauer MRN: [id number] Date of Birth: [birth year]  Today's Date: 10/13/2021 SLP Individual Time: 1110-1200 SLP Individual Time Calculation (min): 50 min and Today's Date: 10/13/2021 SLP Missed Time: 10 Minutes Missed Time Reason: Nursing care  Short Term Goals: Week 1: SLP Short Term Goal 1 (Week 1): Patient will consume current diet with minimal overt s/sx of aspiration and with min A for implementaiton of swallow precautions and strategies SLP Short Term Goal 2 (Week 1): Patient will consume thin liquid trials with minimal s/sx of aspiration with min A verbal cues for small, single sips to indicate readiness for liquid advancement SLP Short Term Goal 3 (Week 1): Patient will express wants/needs via multimodal communication with max A verbal/visual cues SLP Short Term Goal 4 (Week 1): Patient will approximate meaningful response during automatic speech sequences during 25% of opportunities with max A multimodal cues SLP Short Term Goal 5 (Week 1): Patient will approximate verbal repetition at the sound level during 25% of opportunities with max A multimodal cues SLP Short Term Goal 6 (Week 1): Pt will verbally respond to yes/no questions during 50% of opportunities with max A multimodal cues  Skilled Therapeutic Interventions: Skilled ST treatment focused on language goals. Patient was received upright in wheelchair on arrival and accompanied by daughter-in-law on arrival. Pt transported by wheelchair to speech therapy room for session.  SLP facilitated session by providing initial cues for word-to-object matching in field of 4 with 100% accuracy.   Pt identified field of 4 objects by feature with sup A verbal cues to achieve 95% accuracy.  Pt responded to mildly complex yes/no questions with 70% accuracy given min A verbal cues for clarification. Pt responded mostly with gestures, and verbally responded during ~30%  of occasions following max A multimodal cues.   At one point in session, pt was seen attempting to verbalize. Through process of elimination, field of choices, and yes/no questions, pt was able to communicate he needed to go to bathroom with max A multimodal cues. Pt transported back to room and performed transfer to commode with min A support for balance. Pt voided bladder; unable to have bowel movement after extensive time. Pt returned to wheelchair.   SLP provided pt with various low-tech communication supports (I.e., visual pain scale, picture-based communication board, and alphabet/number board). Pt identified pictures on communication board with 100% accuracy given sup A verbal cues and extended time. SLP provided hypothetical scenarios and pt pointed to appropriate response with 100% accuracy. Daughter-in-law present for education on use. Will continue for generalization.   Patient was left in chair with alarm activated and immediate needs within reach at end of session. Continue per current plan of care.       Pain None/denied  Therapy/Group: Individual Therapy  Patty Sermons 10/13/2021, 4:24 PM

## 2021-10-13 NOTE — Progress Notes (Signed)
Physical Therapy Session Note  Patient Details  Name: Gerald Bauer MRN: [id number] Date of Birth: [birth year]  Today's Date: 10/13/2021 PT Individual Time: 1015-1053 PT Individual Time Calculation (min): 38 min   Short Term Goals: Week 1:  PT Short Term Goal 1 (Week 1): Pt will perform supine<>sit with mod assist of 1 PT Short Term Goal 2 (Week 1): Pt will perform sit<>stands with min assist of 1 PT Short Term Goal 3 (Week 1): Pt will perform bed<>chair transfers using LRAD with min assist of 1 PT Short Term Goal 4 (Week 1): Pt will ambulate at least 34f using LRAD with min assist of 1 PT Short Term Goal 5 (Week 1): Pt will navigate 4 steps using HRs with mod assist of 1  Skilled Therapeutic Interventions/Progress Updates:   Received pt sitting in WC with DIL present at bedside. Pt continues to communicate via head nods. Session with emphasis on functional mobility/transfers, generalized strengthening and endurance, and simulated car transfers. [facility] purposes. Pt performed simulated car transfer without AD and min A with cues for R attention and assist to support RUE - of note, pt with increased difficulty when transferring to R>L. Pt then transferred on/off Nustep without AD and min A and performed seated BLE strengthening on Nustep at workload 4 for 8 minutes with emphasis on cardiovascular endurance and reciprocal movement training with cues for attention to R side. Returned to room and concluded [facility], awaiting upcoming SLP session -DIL present at bedside.   Therapy Documentation Precautions:  Precautions Precautions: Fall, Other (comment) Precaution Comments: R hemi, R inattention, aphasia, dysphagia Restrictions Weight Bearing Restrictions: No  Therapy/Group: Individual Therapy AAlfonse AlpersPT, DPT  10/13/2021, 7:01 AM

## 2021-10-14 DIAGNOSIS — K5901 Slow transit constipation: Secondary | ICD-10-CM

## 2021-10-14 DIAGNOSIS — E669 Obesity, unspecified: Secondary | ICD-10-CM

## 2021-10-14 DIAGNOSIS — E1169 Type 2 diabetes mellitus with other specified complication: Secondary | ICD-10-CM

## 2021-10-14 DIAGNOSIS — I69391 Dysphagia following cerebral infarction: Secondary | ICD-10-CM

## 2021-10-14 DIAGNOSIS — I69320 Aphasia following cerebral infarction: Secondary | ICD-10-CM

## 2021-10-14 LAB — GLUCOSE, CAPILLARY
Glucose-Capillary: 134 mg/dL — ABNORMAL HIGH (ref 70–99)
Glucose-Capillary: 209 mg/dL — ABNORMAL HIGH (ref 70–99)
Glucose-Capillary: 185 mg/dL — ABNORMAL HIGH (ref 70–99)
Glucose-Capillary: 109 mg/dL — ABNORMAL HIGH (ref 70–99)

## 2021-10-14 MED ORDER — SORBITOL 70 % SOLN
60.0000 mL | Status: AC
  Administered 2021-10-14: 60 mL via ORAL
  Filled 2021-10-14: qty 60

## 2021-10-14 MED ORDER — SENNOSIDES-DOCUSATE SODIUM 8.6-50 MG PO TABS
2.0000 | ORAL_TABLET | Freq: Every day | ORAL | Status: DC
  Administered 2021-10-14 – 2021-10-18 (×5): 2 via ORAL
  Filled 2021-10-14 (×5): qty 2

## 2021-10-14 NOTE — Progress Notes (Signed)
Occupational Therapy Session Note  Patient Details  Name: Gerald Bauer MRN: [id number] Date of Birth: February 01, 1946  Today's Date: 10/14/2021 OT Individual Time: 2952-8413 OT Individual Time Calculation (min): 60 min    Short Term Goals: Week 1:  OT Short Term Goal 1 (Week 1): Pt will complete 1/3 toileting steps with no more than min A for standing balance OT Short Term Goal 2 (Week 1): Pt will thread BLE into pants with AE PRN with no more than CGA for sitting balance OT Short Term Goal 3 (Week 1): Pt with sit > stand in prep for ADL with min A using LRAD OT Short Term Goal 4 (Week 1): Pt will self-iniate RUE positioning for safety on 2 occurences with supervision  Skilled Therapeutic Interventions/Progress Updates:  Pt awake seated in w/c upon OT arrival to the room. Pt provides no vocalizations, however, gives a thumbs up to indicate pt in agreement for OT session.   Therapy Documentation Precautions:  Precautions Precautions: Fall, Other (comment) Precaution Comments: R hemi, R inattention, aphasia, dysphagia Restrictions Weight Bearing Restrictions: No Vital Signs: Please see "Flowsheet" for most recent vitals charted by nursing staff.  Pain: Pain Assessment Pain Scale: 0-10 Pain Score: 0-No pain  ADL: ADL Eating: Not assessed Grooming: Minimal assistance (Education provided to pt on hemi-techniques to improve independence with oral care tasks. Pt able to perform oral care with minimal assistance to manage toothpaste while seated in the w/c at the sink.) Where Assessed-Grooming: Wheelchair, Sitting at sink ADL Comments: Education provided to pt on hemi-techniques to improve independence with grooming tasks such as using RUE as a stablizer for grooming tools (holding toothpaste while pt opens with L). Encouraged pt to involve RUE into functional tasks even as a stabilizer to improve motor control and encourage bilateral coordination. Pt able to use these techniques with  moderate VCs. Pt able to complete a SPT to the L from w/c > bed with minimal assistance and cautionary blocking of RLE.  RUE Neuro Re-Education: Pt participates in RUE neuro re-education tasks in order to improve motor control in RUE which is needed to increased independence with ADLs/mobility. Pt able to demo learning techniques for self-ROM by performing shoulder flexion >90 degrees using LUE laced with RUE. Education provided to pt on importance of perform RUE shoulder movements with self-ROM <90 degrees to maintain joint integrity. Skilled education and demo provided to pt on safe techniques to perform self-ROM using LUE to assist RUE with the following movements with shoulder movements remaining <90 degrees: shoulder flexion, shoulder abduction, shoulder horizontal abd/add, elboe flex/ext, forearm pro/sup, wrist flex/ext, and composite digital flex/ext. However, pt then able to demo a strong shoulder shrug on RUE. Encouraged pt to perform shoulder shrugs and AROM with RUE as part of neuro re-education. Pt able to perform the following movements with AROM with RUE: ~80 degrees of shoulder flexion and abduction, ~50 degrees elbow flexion, functional wrist extension, and full (yet weak) gross composite digital flex/ext. Pt verbalized understanding by giving a thumbs up and then when returned to bed placed yellow foam block in R hand to encourage strengthening of R gross grasp. Pt does requires moderate VC's for attention to RUE during functional transfers. When instructed to move RUE pt immediately uses LUE to move RUE. Educated pt on importance of trying to perform AROM prior to then relying on LUE to move. However, can use LUE if needed to move RUE to a safe position.   Health Education: OT noted that pt  did not have any (thickened) water or liquids in room for hydration. OT inquired with pt if pt has been drinking many liquids today with pt shaking head no. OT provided education on plan to get water and  thicken for pt to stay hydrated. Pt shook head "no" d/t pt reporting not liking thickened liquids. OT then problem solved with pt on a liquid that pt would drink after thickened and pt agreed to water and cranberry juice (RN cleared). OT provided assist to thicken liquids to nectar thick and placed on bedside table with pt noted to drink more water than cranberry juice. Educated pt on implications of not staying hydrated and how this can cause further medical issues or prolonged admission. Pt shook head yes to demo understanding.   Functional Mobility: OT encouraged pt to recall learned transfer techniques. Pt able to indicate need to perform transfer to L side. Educated pt on proper w/c position and importance of locking brakes. Pt then able to complete a SPT to the L from w/c > EOB with minimal assistance and cautionary blocking of RLE for safety. Pt then able to transition from sit > supine with CGA to full load RLE into bed.   Pt returned to bed at end of session. Pt left resting comfortably in bed with personal belongings and call light within reach (on L side), bed alarm on and activated, bed in low position, 3 bed rails up, urinal on (L) bed rail on, and comfort needs attended to.    Therapy/Group: Individual Therapy  [name] 10/14/2021, 4:31 PM

## 2021-10-14 NOTE — Progress Notes (Signed)
PROGRESS NOTE   Subjective/Complaints: No results with sorbitol yesterday. Otherwise patient doing fairly well. No new complaints today  ROS: Patient denies fever, rash, sore throat, blurred vision, dizziness, nausea, vomiting, diarrhea, cough, shortness of breath or chest pain,   headache, or mood change.    Objective:   No results found.  No results for input(s): "WBC", "HGB", "HCT", "PLT" in the last 72 hours.  No results for input(s): "NA", "K", "CL", "CO2", "GLUCOSE", "BUN", "CREATININE", "CALCIUM" in the last 72 hours.   Intake/Output Summary (Last 24 hours) at 10/14/2021 1046 Last data filed at 10/13/2021 1300 Gross per 24 hour  Intake 200 ml  Output --  Net 200 ml        Physical Exam: Vital Signs Blood pressure (!) 137/91, pulse 81, temperature 97.6 F (36.4 C), resp. rate 18, height '5\' 7"'$  (1.702 m), weight 74.5 kg, SpO2 97 %. Constitutional: No distress . Vital signs reviewed. HEENT: NCAT, EOMI, oral membranes moist Neck: supple Cardiovascular: RRR without murmur. No JVD    Respiratory/Chest: CTA Bilaterally without wheezes or rales. Normal effort    GI/Abdomen: BS +, non-tender, non-distended Ext: no clubbing, cyanosis, or edema Psych: pleasant and cooperative  Skin: intact Neurologic: Cranial nerves II through XII intact, motor strength is 5/5 in left deltoid, bicep, tricep, grip, hip flexor, knee extensors, ankle dorsiflexor and plantar flexor RUE 2- RLE 4/5 Sensory exam normal sensation to light touch and proprioception in bilateral upper and lower extremities Decreased insight into deficits Expressive aphasia ongoing, uses head nods primarily Musc: right knee/shoulder discomfort with ROM     Assessment/Plan: 1. Functional deficits which require 3+ hours per day of interdisciplinary therapy in a comprehensive inpatient rehab setting. Physiatrist is providing close team supervision and 24 hour  management of active medical problems listed below. Physiatrist and rehab team continue to assess barriers to discharge/monitor patient progress toward functional and medical goals  Care Tool:  Bathing    Body parts bathed by patient: Face, Right arm, Chest, Abdomen, Left arm   Body parts bathed by helper: Left arm, Front perineal area, Buttocks, Right lower leg, Left lower leg     Bathing assist Assist Level: Minimal Assistance - Patient > 75%     Upper Body Dressing/Undressing Upper body dressing   What is the patient wearing?: Pull over shirt    Upper body assist Assist Level: Minimal Assistance - Patient > 75%    Lower Body Dressing/Undressing Lower body dressing      What is the patient wearing?: Pants     Lower body assist Assist for lower body dressing: Moderate Assistance - Patient 50 - 74%     Toileting Toileting    Toileting assist Assist for toileting: Maximal Assistance - Patient 25 - 49%     Transfers Chair/bed transfer  Transfers assist     Chair/bed transfer assist level: Minimal Assistance - Patient > 75%     Locomotion Ambulation   Ambulation assist      Assist level: 2 helpers Assistive device: Other (comment) (L hallway rail) Max distance: 70f   Walk 10 feet activity   Assist     Assist level: 2 helpers Assistive  device: Other (comment), Hand held assist (L hallway rail)   Walk 50 feet activity   Assist Walk 50 feet with 2 turns activity did not occur: Safety/medical concerns         Walk 150 feet activity   Assist Walk 150 feet activity did not occur: Safety/medical concerns         Walk 10 feet on uneven surface  activity   Assist Walk 10 feet on uneven surfaces activity did not occur: Safety/medical concerns         Wheelchair     Assist Is the patient using a wheelchair?: Yes Type of Wheelchair: Manual    Wheelchair assist level: Supervision/Verbal cueing Max wheelchair distance: 32f     Wheelchair 50 feet with 2 turns activity    Assist        Assist Level: Supervision/Verbal cueing   Wheelchair 150 feet activity     Assist      Assist Level: Dependent - Patient 0% (based on PT documentation that w/c mobility was used for transportation only)   Blood pressure (!) 137/91, pulse 81, temperature 97.6 F (36.4 C), resp. rate 18, height '5\' 7"'$  (1.702 m), weight 74.5 kg, SpO2 97 %.  Medical Problem List and Plan: 1. 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  Antithrombotics: -DVT/anticoagulation:  Pharmaceutical: Lovenox             -antiplatelet therapy: Aspirin 81 mg daily and Plavix 75 mg daily to continue indefinitely 3. Shoulder arthritis: voltaren gel ordered. Tylenol as needed 4. Mood/Behavior/Sleep: Provide emotional support             -antipsychotic agents: N/A 5. Neuropsych/cognition: This patient is not capable of making decisions on his own behalf. 6. Skin/Wound Care: Routine skin checks 7. Fluids/Electrolytes/Nutrition: Routine in and outs with follow-up chemistries 8.  Hypertension.  Norvasc 5 mg daily.  Monitor with increased mobility 9.  Diabetes mellitus.  Hemoglobin A1c 7.1.  Presently with SSI.  Patient maintained on Increase metformin to '500mg'$  BID   Diet has been discussed 10.  Hyperlipidemia.  Intolerant to statin. Continue Repatha 140 mg subcutaneous every 2 weeks prior to admission. 11.  History of prostate cancer/prostatectomy 2024.  Follow-up outpatient 12. Hypoalbuminemia: recommended choosing foods high in protein and low in added sugar.  13. Constipation: continue magnesium gluconate '250mg'$  HS. Provide dietary education. No BM  sorbitol yesterday  -7/22 will reorder sorbitol this morning followed by SSE if needed in PM 14.  Overweight BMI 25.72: provide dietary education 15. Expressive aphasia: continue SLP 16. 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

## 2021-10-14 NOTE — Progress Notes (Signed)
Occupational Therapy Session Note  Patient Details  Name: Gerald Bauer MRN: [id number] Date of Birth: Mar 26, 1946  Today's Date: 10/14/2021 OT Individual Time: 3704-8889 OT Individual Time Calculation (min): 45 min    Short Term Goals: Week 1:  OT Short Term Goal 1 (Week 1): Pt will complete 1/3 toileting steps with no more than min A for standing balance OT Short Term Goal 2 (Week 1): Pt will thread BLE into pants with AE PRN with no more than CGA for sitting balance OT Short Term Goal 3 (Week 1): Pt with sit > stand in prep for ADL with min A using LRAD OT Short Term Goal 4 (Week 1): Pt will self-iniate RUE positioning for safety on 2 occurences with supervision  Skilled Therapeutic Interventions/Progress Updates:    Upon OT arrival, pt semi recumbent in bed resting. Pt able to be easily wakened. Pt reports no pain and is agreeable to OT treatment session. OT intervention with a focus on self care retraining, NMR, and attention to the R side. Pt completes supine to sit transfer with SBA and donns shoes EOB with Mod A. Pt completes stand pivot transfer with HHA and Min A demonstrating less control. Pt was positioned infront of the sink to complete oral care and comb hair facilitating use of the R UE during self care. Pt completes oral care with Min A and combs hair with Setup assist using the L UE. Pt was transported to main therapy gym to complete estim to the R UE. L UE finger extension completed at 24 MHz for 12 minutes with good muscle activation. Pt was transported back to his room via w/c and total A and left in w/c at end of session with all needs met.   Therapy Documentation Precautions:  Precautions Precautions: Fall, Other (comment) Precaution Comments: R hemi, R inattention, aphasia, dysphagia Restrictions Weight Bearing Restrictions: No    Therapy/Group: Individual Therapy  Gabe Glace 10/14/2021, 8:04 AM

## 2021-10-14 NOTE — Progress Notes (Signed)
Physical Therapy Session Note  Patient Details  Name: Gerald Bauer MRN: [id number] Date of Birth: [birth year]  Today's Date: 10/14/2021 PT Individual Time: 0900-0944 PT Individual Time Calculation (min): 44 min   Short Term Goals: Week 1:  PT Short Term Goal 1 (Week 1): Pt will perform supine<>sit with mod assist of 1 PT Short Term Goal 2 (Week 1): Pt will perform sit<>stands with min assist of 1 PT Short Term Goal 3 (Week 1): Pt will perform bed<>chair transfers using LRAD with min assist of 1 PT Short Term Goal 4 (Week 1): Pt will ambulate at least 25f using LRAD with min assist of 1 PT Short Term Goal 5 (Week 1): Pt will navigate 4 steps using HRs with mod assist of 1  Skilled Therapeutic Interventions/Progress Updates:    Chart reviewed and pt agreeable to therapy. Pt received seated in WC with no c/o pain. Session focused on NMR to facilitate RUE motor return and ambulation to promote independent home access. Pt initiated session with MinA for WC propulsion for ~203fto therapy gym. Pt then completed 6 rounds of amb with various tasks. Pt first completed 2x 3094fmb forward and then backward using CGA + L handrail. Pt was consistently cued to squeeze R hand during amb, and also required VC for R toe clearance during backwards walking. Pt then completed 2 more rounds of 80f71fb forward then backward with 2 finger touch on L hand rail instead of grip and increased cueing to squeeze R hand to promote motor return and function during task. Pt noted to require fewer VC on 2nd trial. Pt had brief LOB only on 1st round. Pt then completed final 2 rounds of amb with CGA + L hand rail and R hand squeezing foam grip. Pt noted to have improved two clearance during backwards walking on final 2 rounds. Pt then completed ~200ft81fpropulsion with MinA to return to room. In room, pt required ModA [place]. Pt completed 3x2 SLS of 30secs followed by 3x10 heel raises with CGA + B handrails. At end of  session, [facility] nurse call bell and all needs in reach.     Therapy Documentation Precautions:  Precautions Precautions: Fall, Other (comment) Precaution Comments: R hemi, R inattention, aphasia, dysphagia Restrictions Weight Bearing Restrictions: No   Therapy/Group: Individual Therapy  Arvis Zwahlen Marquette Old DPT 10/14/2021, 9:47 AM

## 2021-10-14 NOTE — Progress Notes (Signed)
Physical Therapy Session Note  Patient Details  Name: Gerald Bauer MRN: [id number] Date of Birth: 11/30/1945  Today's Date: 10/14/2021 PT Individual Time: 1300-1345 PT Individual Time Calculation (min): 45 min   Short Term Goals: Week 1:  PT Short Term Goal 1 (Week 1): Pt will perform supine<>sit with mod assist of 1 PT Short Term Goal 2 (Week 1): Pt will perform sit<>stands with min assist of 1 PT Short Term Goal 3 (Week 1): Pt will perform bed<>chair transfers using LRAD with min assist of 1 PT Short Term Goal 4 (Week 1): Pt will ambulate at least 49f using LRAD with min assist of 1 PT Short Term Goal 5 (Week 1): Pt will navigate 4 steps using HRs with mod assist of 1  Skilled Therapeutic Interventions/Progress Updates: Pt presents sitting in w/c and agreeable to therapy.  Pt wheeled to handrail outside of main gym for time conservation.  Pt transfers sit to stand w/ min A and verbal cues for proper foot placement prior to standing.  Pt amb w/ L hand rail and min A w/ occasional verbal cues for maintaining BOS.  Pt amb backward w/ min and occasional mod A w/ cueing for weight shift to L to bring R foot back.  Pt amb w/o AD and mod A x 110' w/ facilitation for weight shift to Left to advance RLE.  Pt w/ noticeable fatigue at conclusion requiring greater facilitation.  Pt amb x 40' w/ mod A and w/o AD.  Pt then gesturing to return to room.  Pt wheeled back to room and pt impulsively trying to stand and amb to BR.  PT required mod to max A and cueing for safety.  Pt required mod A to doff pants and brief, incontinent of stool.  Pt then transferred stand to sit on toilet w/ min A.  Pt continent of bowel and bladder, NT to chart.  Pt transferred sit to stand w/ total A for perineal cleaning and don clean brief.  Pt amb w/o AD to sink and washed hands w/ cueing.  Pt sat in w/c and remained sitting.  Handed off to NT for vitals and donning chair alarm.     Therapy Documentation Precautions:   Precautions Precautions: Fall, Other (comment) Precaution Comments: R hemi, R inattention, aphasia, dysphagia Restrictions Weight Bearing Restrictions: No General:   Vital Signs:  Pain:no c/o pain.      Therapy/Group: Individual Therapy  JLadoris Gene7/22/2023, 1:48 PM

## 2021-10-15 LAB — GLUCOSE, CAPILLARY
Glucose-Capillary: 166 mg/dL — ABNORMAL HIGH (ref 70–99)
Glucose-Capillary: 191 mg/dL — ABNORMAL HIGH (ref 70–99)
Glucose-Capillary: 142 mg/dL — ABNORMAL HIGH (ref 70–99)
Glucose-Capillary: 170 mg/dL — ABNORMAL HIGH (ref 70–99)

## 2021-10-15 LAB — SARS CORONAVIRUS 2 BY RT PCR: SARS Coronavirus 2 by RT PCR: NEGATIVE

## 2021-10-15 NOTE — Progress Notes (Addendum)
PROGRESS NOTE   Subjective/Complaints: Pt up in bed. Nurse reported decreased urine output. Occurrences of urination recorded in flowsheet throughout day yesterday. Had large lquid bm yesterday afternoon also  ROS: limited due to language/communication    Objective:   No results found.  No results for input(s): "WBC", "HGB", "HCT", "PLT" in the last 72 hours.  No results for input(s): "NA", "K", "CL", "CO2", "GLUCOSE", "BUN", "CREATININE", "CALCIUM" in the last 72 hours.   Intake/Output Summary (Last 24 hours) at 10/15/2021 1041 Last data filed at 10/15/2021 0817 Gross per 24 hour  Intake 616 ml  Output 300 ml  Net 316 ml        Physical Exam: Vital Signs Blood pressure (!) 143/88, pulse 82, temperature (!) 97.5 F (36.4 C), resp. rate 20, height '5\' 7"'$  (1.702 m), weight 74.5 kg, SpO2 98 %. Constitutional: No distress . Vital signs reviewed. HEENT: NCAT, EOMI, oral membranes moist Neck: supple Cardiovascular: RRR without murmur. No JVD    Respiratory/Chest: CTA Bilaterally without wheezes or rales. Normal effort    GI/Abdomen: BS +, non-tender, non-distended Ext: no clubbing, cyanosis, or edema Psych: pleasant and cooperative   Skin: intact Neurologic: Cranial nerves II through XII intact, motor strength is 5/5 in left deltoid, bicep, tricep, grip, hip flexor, knee extensors, ankle dorsiflexor and plantar flexor RUE 2- RLE 4/5 Sensory exam normal sensation to light touch and proprioception in bilateral upper and lower extremities Expressive aphasia ongoing, uses head nods primarily for me, tries to vocalize Musc: right knee/shoulder discomfort with ROM     Assessment/Plan: 1. Functional deficits which require 3+ hours per day of interdisciplinary therapy in a comprehensive inpatient rehab setting. Physiatrist is providing close team supervision and 24 hour management of active medical problems listed  below. Physiatrist and rehab team continue to assess barriers to discharge/monitor patient progress toward functional and medical goals  Care Tool:  Bathing    Body parts bathed by patient: Face, Right arm, Chest, Abdomen, Left arm   Body parts bathed by helper: Left arm, Front perineal area, Buttocks, Right lower leg, Left lower leg     Bathing assist Assist Level: Minimal Assistance - Patient > 75%     Upper Body Dressing/Undressing Upper body dressing   What is the patient wearing?: Pull over shirt    Upper body assist Assist Level: Minimal Assistance - Patient > 75%    Lower Body Dressing/Undressing Lower body dressing      What is the patient wearing?: Pants     Lower body assist Assist for lower body dressing: Moderate Assistance - Patient 50 - 74%     Toileting Toileting    Toileting assist Assist for toileting: Moderate Assistance - Patient 50 - 74%     Transfers Chair/bed transfer  Transfers assist     Chair/bed transfer assist level: Minimal Assistance - Patient > 75%     Locomotion Ambulation   Ambulation assist      Assist level: Moderate Assistance - Patient 50 - 74% Assistive device: No Device Max distance: 110   Walk 10 feet activity   Assist     Assist level: Moderate Assistance - Patient - 56 -  74% Assistive device: No Device   Walk 50 feet activity   Assist Walk 50 feet with 2 turns activity did not occur: Safety/medical concerns  Assist level: Moderate Assistance - Patient - 50 - 74% Assistive device: No Device    Walk 150 feet activity   Assist Walk 150 feet activity did not occur: Safety/medical concerns         Walk 10 feet on uneven surface  activity   Assist Walk 10 feet on uneven surfaces activity did not occur: Safety/medical concerns         Wheelchair     Assist Is the patient using a wheelchair?: Yes Type of Wheelchair: Manual    Wheelchair assist level: Supervision/Verbal cueing Max  wheelchair distance: 67f    Wheelchair 50 feet with 2 turns activity    Assist        Assist Level: Supervision/Verbal cueing   Wheelchair 150 feet activity     Assist      Assist Level: Dependent - Patient 0% (based on PT documentation that w/c mobility was used for transportation only)   Blood pressure (!) 143/88, pulse 82, temperature (!) 97.5 F (36.4 C), resp. rate 20, height '5\' 7"'$  (1.702 m), weight 74.5 kg, SpO2 98 %.  Medical Problem List and Plan: 1. 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  Antithrombotics: -DVT/anticoagulation:  Pharmaceutical: Lovenox             -antiplatelet therapy: Aspirin 81 mg daily and Plavix 75 mg daily to continue indefinitely 3. Shoulder arthritis: voltaren gel ordered. Tylenol as needed 4. Mood/Behavior/Sleep: Provide emotional support             -antipsychotic agents: N/A 5. Neuropsych/cognition: This patient is not capable of making decisions on his own behalf. 6. Skin/Wound Care: Routine skin checks 7. Fluids/Electrolytes/Nutrition: Routine in and outs with follow-up chemistries 8.  Hypertension.  Norvasc 5 mg daily.  Monitor with increased mobility 9.  Diabetes mellitus.  Hemoglobin A1c 7.1.  Presently with SSI.  Patient maintained on On metformin '500mg'$  BID (home dose)  Diet has been discussed  CBG (last 3)  Recent Labs    10/14/21 1656 10/14/21 2107 10/15/21 0558  GLUCAP 185* 109* 166*    -7/23 fair to borderline control.  10.  Hyperlipidemia.  Intolerant to statin. Continue Repatha 140 mg subcutaneous every 2 weeks prior to admission. 11.  History of prostate cancer/prostatectomy 2024.  Follow-up outpatient 12. Hypoalbuminemia: recommended choosing foods high in protein and low in added sugar.  13.  Constipation: continue magnesium gluconate '250mg'$  HS. Provide dietary education. No BM  sorbitol yesterday  -7/23 positive reports with 60cc sorbitol yesterday--observe for pattern   -senokot s at bedtime added 14. Overweight BMI 25.72: provide dietary education 15. Expressive aphasia: continue SLP 16. 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

## 2021-10-15 NOTE — Progress Notes (Signed)
Physical Therapy Session Note  Patient Details  Name: Gerald Bauer MRN: [id number] Date of Birth: [birth year]  Today's Date: 10/15/2021 PT Individual Time: 3300-7622 PT Individual Time Calculation (min): 44 min   Short Term Goals: Week 1:  PT Short Term Goal 1 (Week 1): Pt will perform supine<>sit with mod assist of 1 PT Short Term Goal 2 (Week 1): Pt will perform sit<>stands with min assist of 1 PT Short Term Goal 3 (Week 1): Pt will perform bed<>chair transfers using LRAD with min assist of 1 PT Short Term Goal 4 (Week 1): Pt will ambulate at least 76f using LRAD with min assist of 1 PT Short Term Goal 5 (Week 1): Pt will navigate 4 steps using HRs with mod assist of 1  Skilled Therapeutic Interventions/Progress Updates:   Chart reviewed and pt agreeable to therapy. Pt received seated in WC with no c/o pain. Session focused on amb progression to promote home mobility. PT initiated session by setting up RW with R hand support for pt. Pt then practiced amb with RW requiring initial instruction for safe sequencing of sit to stand and amb with RW. Pt amb 473fwith RW + MinA. Pt then propelled WC 15073fo therapy gym with MinA. In gym, pt amb 125f39fing MinA + RW with R hand support. Pt noted to need increased time, but did not require rest break. Pt displayed good safety judgement with R foot placement before progressing RW, but required periodic VC during turns with RW for safety. Pt then returned to room for time management. At end of session, [facility], nurse call bell and all needs in reach.      Therapy Documentation Precautions:  Precautions Precautions: Fall, Other (comment) Precaution Comments: R hemi, R inattention, aphasia, dysphagia Restrictions Weight Bearing Restrictions: No    Therapy/Group: Individual Therapy  KiraMarquette Old, DPT 10/15/2021, 3:36 PM

## 2021-10-15 NOTE — Progress Notes (Signed)
Occupational Therapy Session Note  Patient Details  Name: Gerald Bauer MRN: [id number] Date of Birth: 11-24-1945  Today's Date: 10/15/2021 OT Individual Time: 9794-9971 OT Individual Time Calculation (min): 56 min    Short Term Goals: Week 1:  OT Short Term Goal 1 (Week 1): Pt will complete 1/3 toileting steps with no more than min A for standing balance OT Short Term Goal 2 (Week 1): Pt will thread BLE into pants with AE PRN with no more than CGA for sitting balance OT Short Term Goal 3 (Week 1): Pt with sit > stand in prep for ADL with min A using LRAD OT Short Term Goal 4 (Week 1): Pt will self-iniate RUE positioning for safety on 2 occurences with supervision  Skilled Therapeutic Interventions/Progress Updates:  Patient met lying supine in bed at conclusion of breakfast meal in agreement with OT treatment session. Shakes head "no" in response to being asked if he was in any pain. Supine to EOB with HOB elevated and SBA. Completes squat-pivot to wc on L with SBA. UB bathing/dressing at sink level with Min A and multimodal cues for hemi technique. LB bathing/dressing with Mod A and cues as indicated above. Often threads BLE through same pant leg. Able to don footwear with Min A in figure-4 position. Assist to tie shoes. Obtained elastic shoelaces and put in shoes to maximize independence with LB dressing. Demonstrates fair- carryover of learned ADL techniques. Additional cues for attention to RUE and waiting for instruction to stand prior to standing. Demonstrates good initiation of RUE into ADL tasks without cues although greatly limited by hemiparesis, decreased proprioception and decreased coordination. Hand over hand assist for functional use given deficits. Completed 3/3 grooming tasks seated/standing at sink level with Min A and cues as indicated above. Education on hemi technique. Focus shifted to RUE NMR with "finger walks" using a wash rag to facilitate digit flexion/extension. Reports  great difficulty with task. Session concluded with patient seated in wc with call bell within reach, belt alarm activated and all needs met.   Therapy Documentation Precautions:  Precautions Precautions: Fall, Other (comment) Precaution Comments: R hemi, R inattention, aphasia, dysphagia Restrictions Weight Bearing Restrictions: No General:    Therapy/Group: Individual Therapy  Rourke Mcquitty R [place]-Davis 10/15/2021, 7:20 AM

## 2021-10-16 LAB — GLUCOSE, CAPILLARY
Glucose-Capillary: 152 mg/dL — ABNORMAL HIGH (ref 70–99)
Glucose-Capillary: 172 mg/dL — ABNORMAL HIGH (ref 70–99)
Glucose-Capillary: 142 mg/dL — ABNORMAL HIGH (ref 70–99)
Glucose-Capillary: 134 mg/dL — ABNORMAL HIGH (ref 70–99)

## 2021-10-16 LAB — CREATININE, SERUM
Creatinine, Ser: 0.93 mg/dL (ref 0.61–1.24)
GFR, Estimated: >60 mL/min (ref >60)

## 2021-10-16 MED ORDER — METFORMIN HCL 850 MG PO TABS
850.0000 mg | ORAL_TABLET | Freq: Two times a day (BID) | ORAL | Status: DC
  Administered 2021-10-16 – 2021-10-17 (×3): 850 mg via ORAL
  Filled 2021-10-16 (×3): qty 1

## 2021-10-16 NOTE — Progress Notes (Signed)
Occupational Therapy Session Note  Patient Details  Name: Gerald Bauer MRN: [id number] Date of Birth: 1946/02/22  Today's Date: 10/16/2021 OT Individual Time: 6063-0160 OT Individual Time Calculation (min): 70 min    Short Term Goals: Week 1:  OT Short Term Goal 1 (Week 1): Pt will complete 1/3 toileting steps with no more than min A for standing balance OT Short Term Goal 2 (Week 1): Pt will thread BLE into pants with AE PRN with no more than CGA for sitting balance OT Short Term Goal 3 (Week 1): Pt with sit > stand in prep for ADL with min A using LRAD OT Short Term Goal 4 (Week 1): Pt will self-iniate RUE positioning for safety on 2 occurences with supervision  Skilled Therapeutic Interventions/Progress Updates:  Skilled OT intervention completed with focus on ADL retraining with adaptations/AE education and RUE grasp/release. Pt received upright in bed, indicating no pain via head nod "no" throughout session 2/2 aphasia however responds to yes/no questions 95% of the time with head nods. Therapist encouraging pt to verbalize his responses throughout with pt able to respond "yes" verbally with encouragement.  Bed mobility with supervision this session with HOB slightly elevated, then min A sit > stand with HHA and min A stand pivot to w/c. Seated at sink, pt completed the following ADLs with education provided regarding adaptations as well AE for maximizing independence: -doffing shirt with hemi technique via pull over head washing UB with use of wash mitt on R hand, with therapist provided hand over hand assist for when washing LUE -applying deodorant with hand over hand assist only for under L arm, with use of R hand for stabilizing the deo bottle and L hand manipulating the cap -opening toothpaste tube, with use of R hand for stabilizing the tube and L hand for twisting the cap off -use of universal cuff to use R hand for brushing teeth with use of pt's L hand for self-hand over hand  assist -donning new shirt with hemi-technique, with cues needed for efficiency and technique and overall min A -donned shoes with supervision this session using figure 4 position with laces already switched to elastic laces for pre-tied modification  Transported pt dependently in w/c <> gym for time. Seated at low table, pt completed functional reach, grasp and release task with RUE while utilizing squigz pieces. Pt with difficulty suctioning piece to surface, with task downgraded to pt only focusing on reaching/placing with R hand and suctioning with L. Pt still demonstrating weak functional grasp, and would benefit from further activities to promote increased use of dominant hand. Pt remained seated in w/c, with belt alarm on and all needs in reach at end of session.   Therapy Documentation Precautions:  Precautions Precautions: Fall, Other (comment) Precaution Comments: R hemi, R inattention, aphasia, dysphagia Restrictions Weight Bearing Restrictions: No    Therapy/Group: Individual Therapy  Blase Mess, MS, OTR/L  10/16/2021, 12:21 PM

## 2021-10-16 NOTE — Progress Notes (Signed)
Pt had a witnessed fall.  The NT, Gerrit Halls, was toileting pt.  When she went to assist him back to his wheelchair pt was too heavy so the NT assisted him to the floor.  Pt was not hurt.  He did not hit his head or have a hard fall.  Vitals:  T-97.5, BP-157/86, P-100, RR-17, O2-99%.

## 2021-10-16 NOTE — Progress Notes (Signed)
PROGRESS NOTE   Subjective/Complaints: Still with shoulder pain. Blood pressure and HR spiked on Friday when he first had voltaren gel applied to shoulder, so it was not reapplied in case if caused these changes in vital signs  ROS: +shoulder pain   Objective:   No results found.  No results for input(s): "WBC", "HGB", "HCT", "PLT" in the last 72 hours.  Recent Labs    10/16/21 0600  CREATININE 0.93     Intake/Output Summary (Last 24 hours) at 10/16/2021 5093 Last data filed at 10/16/2021 0753 Gross per 24 hour  Intake 998 ml  Output 150 ml  Net 848 ml        Physical Exam: Vital Signs Blood pressure 131/85, pulse 92, temperature 97.9 F (36.6 C), resp. rate 17, height '5\' 7"'$  (1.702 m), weight 74.5 kg, SpO2 98 %. Constitutional: No distress . Vital signs reviewed. HEENT: NCAT, EOMI, oral membranes moist Neck: supple Cardiovascular: RRR without murmur. No JVD    Respiratory/Chest: CTA Bilaterally without wheezes or rales. Normal effort    GI/Abdomen: BS +, non-tender, non-distended Ext: no clubbing, cyanosis, or edema Psych: pleasant and cooperative   Skin: intact Neurologic: Cranial nerves II through XII intact, motor strength is 5/5 in left deltoid, bicep, tricep, grip, hip flexor, knee extensors, ankle dorsiflexor and plantar flexor RUE 2- RLE 4/5 Sensory exam normal sensation to light touch and proprioception in bilateral upper and lower extremities Expressive aphasia ongoing, uses head nods primarily for me, tries to vocalize Musc: right knee/shoulder discomfort with ROM, palpation     Assessment/Plan: 1. Functional deficits which require 3+ hours per day of interdisciplinary therapy in a comprehensive inpatient rehab setting. Physiatrist is providing close team supervision and 24 hour management of active medical problems listed below. Physiatrist and rehab team continue to assess barriers to  discharge/monitor patient progress toward functional and medical goals  Care Tool:  Bathing    Body parts bathed by patient: Face, Right arm, Chest, Abdomen, Left arm   Body parts bathed by helper: Left arm, Front perineal area, Buttocks, Right lower leg, Left lower leg     Bathing assist Assist Level: Minimal Assistance - Patient > 75%     Upper Body Dressing/Undressing Upper body dressing   What is the patient wearing?: Pull over shirt    Upper body assist Assist Level: Minimal Assistance - Patient > 75%    Lower Body Dressing/Undressing Lower body dressing      What is the patient wearing?: Pants     Lower body assist Assist for lower body dressing: Moderate Assistance - Patient 50 - 74%     Toileting Toileting    Toileting assist Assist for toileting: Moderate Assistance - Patient 50 - 74%     Transfers Chair/bed transfer  Transfers assist     Chair/bed transfer assist level: Minimal Assistance - Patient > 75%     Locomotion Ambulation   Ambulation assist      Assist level: Moderate Assistance - Patient 50 - 74% Assistive device: No Device Max distance: 110   Walk 10 feet activity   Assist     Assist level: Moderate Assistance - Patient - 73 -  74% Assistive device: No Device   Walk 50 feet activity   Assist Walk 50 feet with 2 turns activity did not occur: Safety/medical concerns  Assist level: Moderate Assistance - Patient - 50 - 74% Assistive device: No Device    Walk 150 feet activity   Assist Walk 150 feet activity did not occur: Safety/medical concerns         Walk 10 feet on uneven surface  activity   Assist Walk 10 feet on uneven surfaces activity did not occur: Safety/medical concerns         Wheelchair     Assist Is the patient using a wheelchair?: Yes Type of Wheelchair: Manual    Wheelchair assist level: Supervision/Verbal cueing Max wheelchair distance: 18f    Wheelchair 50 feet with 2 turns  activity    Assist        Assist Level: Supervision/Verbal cueing   Wheelchair 150 feet activity     Assist      Assist Level: Dependent - Patient 0% (based on PT documentation that w/c mobility was used for transportation only)   Blood pressure 131/85, pulse 92, temperature 97.9 F (36.6 C), resp. rate 17, height '5\' 7"'$  (1.702 m), weight 74.5 kg, SpO2 98 %.  Medical Problem List and Plan: 1. 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  Antithrombotics: -DVT/anticoagulation:  Pharmaceutical: Lovenox             -antiplatelet therapy: Aspirin 81 mg daily and Plavix 75 mg daily to continue indefinitely 3. Shoulder arthritis: voltaren gel ordered. Discussed with therapy that I do not usually see spikes in blood pressure with topical voltaren gel, we can try again today and assess for these changes in vitals. Tylenol as needed 4. Mood/Behavior/Sleep: Provide emotional support             -antipsychotic agents: N/A 5. Neuropsych/cognition: This patient is not capable of making decisions on his own behalf. 6. Skin/Wound Care: Routine skin checks 7. Fluids/Electrolytes/Nutrition: Routine in and outs with follow-up chemistries 8.  Hypertension.  Continue Norvasc 5 mg daily.  Monitor with increased mobility 9.  Diabetes mellitus.  Hemoglobin A1c 7.1.  Presently with SSI.  Patient maintained on Increase metformin '850mg'$  BID (home dose)  Diet has been discussed  CBG (last 3)  Recent Labs    10/15/21 1637 10/15/21 2046 10/16/21 0547  GLUCAP 142* 170* 152*    -7/23 fair to borderline control.  10.  Hyperlipidemia.  Intolerant to statin. Continue Repatha 140 mg subcutaneous every 2 weeks prior to admission. 11.  History of prostate cancer/prostatectomy 2024.  Follow-up  outpatient 12. Hypoalbuminemia: recommended choosing foods high in protein and low in added sugar.  13. Constipation: continue magnesium gluconate '250mg'$  HS. Provide dietary education. Resolved with sorbitol 14. Overweight BMI 25.72: provide dietary education 15. Expressive aphasia: continue SLP 16. 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

## 2021-10-16 NOTE — Progress Notes (Signed)
Speech Language Pathology Daily Session Note  Patient Details  Name: Gerald Bauer MRN: [id number] Date of Birth: 12/31/1945  Today's Date: 10/16/2021 SLP Individual Time: 1005-1100 SLP Individual Time Calculation (min): 55 min  Short Term Goals: Week 1: SLP Short Term Goal 1 (Week 1): Patient will consume current diet with minimal overt s/sx of aspiration and with min A for implementaiton of swallow precautions and strategies. - Consumed 4 oz of nectar-thick liquid via cup with no overt s/sx concerning for aspiration with Mod I.  SLP Short Term Goal 2 (Week 1): Patient will consume thin liquid trials with minimal s/sx of aspiration with min A verbal cues for small, single sips to indicate readiness for liquid advancement. - Did not formally address this session.   SLP Short Term Goal 3 (Week 1): Patient will express wants/needs via multimodal communication with max A verbal/visual cues. - Pt communicated wants/needs via environment via yes/no responses with Min A.   SLP Short Term Goal 4 (Week 1): Patient will approximate meaningful response during automatic speech sequences during 25% of opportunities with max A multimodal cues. - Pt approximated meaningful responses during automatic speech sequences during ~10% of opportunities with Max A multimodal cues (counting from 1-3, and then 1-10).   SLP Short Term Goal 5 (Week 1): Patient will approximate verbal repetition at the sound level during 25% of opportunities with max A multimodal cues. - Pt approximated verbal repetition at the word level during ~10% of opportunities with Max A multimodal cues.   SLP Short Term Goal 6 (Week 1): Pt will verbally respond to yes/no questions during 50% of opportunities with max A multimodal cues - ~60% of opportunities given Min A. Only 50% accuracy  with semi-complex yes/no questions; ~90% accuracy for simple + environmental questions.   Skilled Therapeutic Interventions: S: Pt seen this date for skilled   ST intervention targeting communication goals outlined above. Pt received awake/alert and OOB in w/c. Initiated head nod to return greeting to SLP; essentially non-verbal. No c/o pain via use of multimodal communication means (yes/no questions and visual supports). Agreeable to ST intervention in hospital room.   O: Please see above for objective data re: pt's performance on targeted goals. SLP provided education re: aphasia as a result of CVA. Benefits from written choices in field of 3-4, asking yes/no questions, utilizing low-tech AAC board to support yes/no responses re: immediate wants/needs (e.g. pointing to the picture of toilet, while asking "do you need to use the bathroom"), and providing extra processing time. Follows simple commands with overall Min A. Benefited from Max A multinodal cues for copying functional words. Receptive ID of common objects 100% when written choice prompts were provided; only able to verbally name 1 item (cup). When pt does attempt to verbalize single words, he is essentially aphonic; did not respond to verbal or model prompts for increased vocal intensity. 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 Benefited from Mod-Max A for error awareness during writing tasks.   A: Pt appears somewhat stimulable for skilled ST interventions to include supported communication interventions, multimodal means of communication, and automatic speech tasks. Tolerated nectar-thick liquid without overt s/sx concerning for aspiration. Will continue to trial Dysphagia 2 and thin water at bedside to determine readiness for diet upgrade, as able.    P: Pt left in room, OOB in w/c with all safety measures activated. Call bell reviewed and within reach and all immediate needs met.  Continue per current ST POC next session.  Pain No/Denies utilizing FACES chart  Therapy/Group: Individual Therapy  Gerald Bauer A  Gerald Bauer 10/16/2021, 10:57 AM

## 2021-10-16 NOTE — Progress Notes (Signed)
Physical Therapy Session Note  Patient Details  Name: Gerald Bauer MRN: [id number] Date of Birth: 24-Jun-1945  Today's Date: 10/16/2021 PT Individual Time: 1301-1416 PT Individual Time Calculation (min): 75 min   Short Term Goals: Week 1:  PT Short Term Goal 1 (Week 1): Pt will perform supine<>sit with mod assist of 1 PT Short Term Goal 2 (Week 1): Pt will perform sit<>stands with min assist of 1 PT Short Term Goal 3 (Week 1): Pt will perform bed<>chair transfers using LRAD with min assist of 1 PT Short Term Goal 4 (Week 1): Pt will ambulate at least 24f using LRAD with min assist of 1 PT Short Term Goal 5 (Week 1): Pt will navigate 4 steps using HRs with mod assist of 1  Skilled Therapeutic Interventions/Progress Updates:      Therapy Documentation Precautions:  Precautions Precautions: Fall, Other (comment) Precaution Comments: R hemi, R inattention, aphasia, dysphagia Restrictions Weight Bearing Restrictions: No  Pt received seated in w/c at bedside and declines pain. Pt transported via w/c from hospital room to main gym for time management and energy conservation. Pt requires CGA for safety with sit to stand transfers and min A for ambulation x 186 feet with RW. Pt presents with narrowed base of support and scissoring with turns and requires min A for lateral losses of balance with turning.  Pt performed 6 inch step up's x 12 with right LE to facilitate weight shift and bearing of right LE. In parallel bars pt performed step to number floor target bilaterally to further facilitate LE weight shifting and bearing of lower extremities. Pt transitioned to quadruped and PT provided support at right UE to facilitate closed chain activation and left UE reaching. Pt had unsuccessful attempt with LE lifting in quadruped position. Pt performed gluteal bridges ( 3 x 10) to increase LE strength and coordination. Pt ambulated additional 186 feet with RW and then transported to hospital room via  w/c for time management and energy conservation. Pt left seated in w/c at bedside with chair alarm on and all needs within reach.     Therapy/Group: Individual Therapy  SVerl DickerSVerl DickerPT, DPT  10/16/2021, 3:28 PM

## 2021-10-17 ENCOUNTER — Encounter: Payer: Self-pay | Admitting: *Deleted

## 2021-10-17 LAB — URINALYSIS, ROUTINE W REFLEX MICROSCOPIC
Bilirubin Urine: NEGATIVE
Glucose, UA: NEGATIVE mg/dL
Hgb urine dipstick: NEGATIVE
Ketones, ur: NEGATIVE mg/dL
Leukocytes,Ua: NEGATIVE
Nitrite: NEGATIVE
Protein, ur: NEGATIVE mg/dL
Specific Gravity, Urine: 1.031 — ABNORMAL HIGH (ref 1.005–1.030)
pH: 5 (ref 5.0–8.0)

## 2021-10-17 LAB — GLUCOSE, CAPILLARY
Glucose-Capillary: 133 mg/dL — ABNORMAL HIGH (ref 70–99)
Glucose-Capillary: 196 mg/dL — ABNORMAL HIGH (ref 70–99)
Glucose-Capillary: 114 mg/dL — ABNORMAL HIGH (ref 70–99)
Glucose-Capillary: 205 mg/dL — ABNORMAL HIGH (ref 70–99)

## 2021-10-17 MED ORDER — TAMSULOSIN HCL 0.4 MG PO CAPS
0.4000 mg | ORAL_CAPSULE | Freq: Every day | ORAL | Status: DC
  Administered 2021-10-17 – 2021-10-30 (×14): 0.4 mg via ORAL
  Filled 2021-10-17 (×14): qty 1

## 2021-10-17 MED ORDER — METFORMIN HCL 500 MG PO TABS
1000.0000 mg | ORAL_TABLET | Freq: Two times a day (BID) | ORAL | Status: DC
  Administered 2021-10-17 – 2021-10-31 (×28): 1000 mg via ORAL
  Filled 2021-10-17 (×28): qty 2

## 2021-10-17 MED ORDER — SODIUM CHLORIDE 0.9 % IV SOLN: INTRAVENOUS | Status: DC

## 2021-10-17 NOTE — Progress Notes (Signed)
PROGRESS NOTE   Subjective/Complaints: Has not urinated since 9pm last night, was unable to void this morning and was bladder scanned for 182, discussed with nursing to please let me know if he voids with therapy  ROS: +shoulder pain, +urinary retention   Objective:   No results found.  No results for input(s): "WBC", "HGB", "HCT", "PLT" in the last 72 hours.  Recent Labs    10/16/21 0600  CREATININE 0.93     Intake/Output Summary (Last 24 hours) at 10/17/2021 0918 Last data filed at 10/17/2021 0700 Gross per 24 hour  Intake 598 ml  Output --  Net 598 ml        Physical Exam: Vital Signs Blood pressure 115/78, pulse 91, temperature 98.5 F (36.9 C), temperature source Oral, resp. rate 16, height '5\' 7"'$  (1.702 m), weight 74.5 kg, SpO2 96 %. Gen: no distress, normal appearing HEENT: oral mucosa pink and moist, NCAT Cardio: Reg rate Chest: normal effort, normal rate of breathing Abd: soft, non-distended Ext: no edema Psych: pleasant, normal affect Skin: intact Neurologic: Cranial nerves II through XII intact, motor strength is 5/5 in left deltoid, bicep, tricep, grip, hip flexor, knee extensors, ankle dorsiflexor and plantar flexor RUE 2- RLE 4/5 Sensory exam normal sensation to light touch and proprioception in bilateral upper and lower extremities Expressive aphasia ongoing, uses head nods primarily for me, tries to vocalize Musc: right knee/shoulder discomfort with ROM, palpation     Assessment/Plan: 1. Functional deficits which require 3+ hours per day of interdisciplinary therapy in a comprehensive inpatient rehab setting. Physiatrist is providing close team supervision and 24 hour management of active medical problems listed below. Physiatrist and rehab team continue to assess barriers to discharge/monitor patient progress toward functional and medical goals  Care Tool:  Bathing    Body parts bathed  by patient: Face, Right arm, Chest, Abdomen, Left arm   Body parts bathed by helper: Left arm, Front perineal area, Buttocks, Right lower leg, Left lower leg     Bathing assist Assist Level: Minimal Assistance - Patient > 75%     Upper Body Dressing/Undressing Upper body dressing   What is the patient wearing?: Pull over shirt    Upper body assist Assist Level: Minimal Assistance - Patient > 75%    Lower Body Dressing/Undressing Lower body dressing      What is the patient wearing?: Pants     Lower body assist Assist for lower body dressing: Moderate Assistance - Patient 50 - 74%     Toileting Toileting    Toileting assist Assist for toileting: Moderate Assistance - Patient 50 - 74%     Transfers Chair/bed transfer  Transfers assist     Chair/bed transfer assist level: Minimal Assistance - Patient > 75%     Locomotion Ambulation   Ambulation assist      Assist level: Minimal Assistance - Patient > 75% Assistive device: Walker-rolling Max distance: 186 feet   Walk 10 feet activity   Assist     Assist level: Minimal Assistance - Patient > 75% Assistive device: Walker-rolling   Walk 50 feet activity   Assist Walk 50 feet with 2 turns activity did not  occur: Safety/medical concerns  Assist level: Minimal Assistance - Patient > 75% Assistive device: Walker-rolling    Walk 150 feet activity   Assist Walk 150 feet activity did not occur: Safety/medical concerns  Assist level: Minimal Assistance - Patient > 75% Assistive device: Walker-rolling    Walk 10 feet on uneven surface  activity   Assist Walk 10 feet on uneven surfaces activity did not occur: Safety/medical concerns         Wheelchair     Assist Is the patient using a wheelchair?: Yes Type of Wheelchair: Manual    Wheelchair assist level: Supervision/Verbal cueing Max wheelchair distance: 41f    Wheelchair 50 feet with 2 turns activity    Assist         Assist Level: Supervision/Verbal cueing   Wheelchair 150 feet activity     Assist      Assist Level: Dependent - Patient 0% (based on PT documentation that w/c mobility was used for transportation only)   Blood pressure 115/78, pulse 91, temperature 98.5 F (36.9 C), temperature source Oral, resp. rate 16, height '5\' 7"'$  (1.702 m), weight 74.5 kg, SpO2 96 %.  Medical Problem List and Plan: 1. 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  Antithrombotics: -DVT/anticoagulation:  Pharmaceutical: Lovenox             -antiplatelet therapy: Aspirin 81 mg daily and Plavix 75 mg daily to continue indefinitely 3. Shoulder arthritis: voltaren gel ordered. Discussed with therapy that I do not usually see spikes in blood pressure with topical voltaren gel, we can try again today and assess for these changes in vitals. Tylenol as needed 4. Mood/Behavior/Sleep: Provide emotional support             -antipsychotic agents: N/A 5. Neuropsych/cognition: This patient is not capable of making decisions on his own behalf. 6. Skin/Wound Care: Routine skin checks 7. Fluids/Electrolytes/Nutrition: Routine in and outs with follow-up chemistries 8.  Hypertension.  Continue Norvasc 5 mg daily.  Monitor with increased mobility 9.  Diabetes mellitus.  Hemoglobin A1c 7.1.  Presently with SSI.  Patient maintained on Increase metformin to 1,000 mg BID  Diet has been discussed  CBG (last 3)  Recent Labs    10/16/21 1728 10/16/21 2117 10/17/21 0606  GLUCAP 142* 134* 133*    -7/23 fair to borderline control.  10.  Hyperlipidemia.  Intolerant to statin. Continue Repatha 140 mg subcutaneous every 2 weeks prior to admission. 11.  History of prostate cancer/prostatectomy 2024.  Follow-up outpatient 12.  Hypoalbuminemia: recommended choosing foods high in protein and low in added sugar.  13. Constipation: continue magnesium gluconate '250mg'$  HS. Provide dietary education. Resolved with sorbitol 14. Overweight BMI 25.72: provide dietary education 15. Expressive aphasia: continue SLP 16. 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 Urinary retention: start flomax HS 155 Fall yesterday: advised that flomax can cause falls by decreasing blood pressure, discussed this is why we give Flomax at night    LOS: 8 days A FACE TO FACE EVALUATION WAS PERFORMED  KMartha ClanP Corde Antonini 10/17/2021, 9:18 AM

## 2021-10-17 NOTE — Progress Notes (Signed)
Last void 10 am, pt up to bathroom at this time, pt unsuccessful. Bladder scanned for 58cc. Encouraged P.O intake several times this shift,

## 2021-10-17 NOTE — Progress Notes (Signed)
Speech Language Pathology Weekly Progress and Session Note  Patient Details  Name: Gerald Bauer MRN: [id number] Date of Birth: Jan 23, 1946  Beginning of progress report period: October 10, 2021 End of progress report period: October 17, 2021  Today's Date: 10/17/2021 SLP Individual Time: 0950-1030 SLP Individual Time Calculation (min): 40 min  Short Term Goals: Week 1: SLP Short Term Goal 1 (Week 1): Patient will consume current diet with minimal overt s/sx of aspiration and with min A for implementaiton of swallow precautions and strategies SLP Short Term Goal 1 - Progress (Week 1): Progressing toward goal SLP Short Term Goal 2 (Week 1): Patient will consume thin liquid trials with minimal s/sx of aspiration with min A verbal cues for small, single sips to indicate readiness for liquid advancement SLP Short Term Goal 2 - Progress (Week 1): Progressing toward goal SLP Short Term Goal 3 (Week 1): Patient will express wants/needs via multimodal communication with max A verbal/visual cues SLP Short Term Goal 3 - Progress (Week 1): Met SLP Short Term Goal 4 (Week 1): Patient will approximate meaningful response during automatic speech sequences during 25% of opportunities with max A multimodal cues SLP Short Term Goal 4 - Progress (Week 1): Not met SLP Short Term Goal 5 (Week 1): Patient will approximate verbal repetition at the sound level during 25% of opportunities with max A multimodal cues SLP Short Term Goal 5 - Progress (Week 1): Met SLP Short Term Goal 6 (Week 1): Pt will verbally respond to yes/no questions during 50% of opportunities with max A multimodal cues SLP Short Term Goal 6 - Progress (Week 1): Met    New Short Term Goals: Week 2: SLP Short Term Goal 1 (Week 2): Patient will consume thin liquid trials with minimal s/sx of aspiration with min A verbal cues for small, single sips to indicate readiness for liquid advancement or MBSS. SLP Short Term Goal 2 (Week 2): Patient will  express wants/needs via multimodal communication (gesture, pictures, yes/no responses) with min A verbal/visual cues SLP Short Term Goal 3 (Week 2): Pt will verbally name common pictures or objects on 60% of occasions given Max A multimodal cues. SLP Short Term Goal 4 (Week 2): Pt will demonstrate reading comprehension at the sentence and paragraph level as evident by answering yes/no questions pertaining reading material with 80% accuracy and Min A. SLP Short Term Goal 5 (Week 2): Pt will correctly write 5 single funcitonal words on last step of CART protocol. SLP Short Term Goal 6 (Week 2): Pt will self-correct 100% of errors during language tasks with Sup A.  Weekly Progress Updates: Pt demonstrated some functional gains as evident by meeting 3 out of 6 short-term goals this reporting period. Pt continues to present with mild-moderate receptive and severe expressive aphasia and verbal apraxia. Tolerating Dysphagia 2 diet textures with NTL. Will plan to continue assessing readiness for diet upgrade this week. May need MBSS to assess tolerance with mixed consistencies. Pt education ongoing. Continue to recommend ST intervention during CIR admission to maximize pt's independence, improve quality of life, and decrease caregiver burden. Recommend 24/7 supervision and assistance, as well as f/u HH or OP ST post-discharge.   Intensity: Minumum of 1-2 x/day, 30 to 90 minutes Frequency: 3 to 5 out of 7 days Duration/Length of Stay: 2.5-3 weeks Treatment/Interventions: Multimodal communication approach;Speech/Language facilitation;Cueing hierarchy;Dysphagia/aspiration precaution training;Functional tasks;Patient/family education;Therapeutic Activities   Daily Session  Skilled Therapeutic Interventions:     S: Pt seen this date for skilled  ST  intervention targeting communication goals outlined above. Pt received with eyes closed, and lying semi-reclined in bed; aroused to name. Initiated head nod to  return greeting to SLP; mouthed what appeared to be "how are you?". No c/o pain via use of multimodal communication means (yes/no questions and visual supports). Agreeable to ST intervention in hospital room.    O: Please see above for objective data re: pt's performance on targeted goals. Pt continues to express wants/needs via yes/no responses and selecting desired items when given written choice prompts; unable to volitionally produce single words responses nor utilize hand gestures to convey immediate wants and needs, at this time. Receptively identified pictures of common objects in a field of 2 and 3 with 94% accuracy independently. Verbally approximated name of object pictures given Mod-Max A multimodal prompts to include sentence completion cues, verbal model of targeted word, as well as visual supports (picture and printed word), though aphonic. Benefits from verbal cues to increase vocal intensity, which did appear to improve pt's verbal approximations for pictures items that he could verbalize. Completed reading comprehension at the sentence level with ~75% accuracy without prompts; improved to 100% accuracy given Min A for self-monitoring. Accurately copied single words to complete short phrases with 60% accuracy. Responded to basic/environmental yes/no questions verbally on ~90% of trials given instructions to verbally communicate answer prior to task initiation; verbal apraxia observed with verbal responses noted to be inconsistently produced/approximated.    A: This session, pt appears stimulable for skilled ST interventions to include supported communication interventions, multimodal means of communication, sentence completion prompts + written cue, verbal imitation approximations, copying single syllable words, and automatic speech tasks. Self-corrected errors with Min A vs Mod-Max A prompts needed last session. Tolerated nectar-thick liquid without overt s/sx concerning for aspiration. Will  plan to address thin water trials at bedside next session to determine readiness for diet upgrade and/or MBSS.     P: Pt left in room, sitting upright in bed with all safety measures activated. Call bell reviewed and within reach and all immediate needs met. NT present for full supervision while pt drank coffee thickened to nectar-thick consistency by SLP. Continue per current ST POC next session.  Pain None/Denies  Therapy/Group: Individual Therapy  Azriella Mattia A Arshiya Jakes 10/17/2021, 11:48 AM

## 2021-10-17 NOTE — Progress Notes (Signed)
Physical Therapy Session Note  Patient Details  Name: Gerald Bauer MRN: [id number] Date of Birth: [birth year]  Today's Date: 10/17/2021 PT Individual Time: 1302-1415 PT Individual Time Calculation (min): 73 min   Short Term Goals: Week 1:  PT Short Term Goal 1 (Week 1): Pt will perform supine<>sit with mod assist of 1 PT Short Term Goal 2 (Week 1): Pt will perform sit<>stands with min assist of 1 PT Short Term Goal 3 (Week 1): Pt will perform bed<>chair transfers using LRAD with min assist of 1 PT Short Term Goal 4 (Week 1): Pt will ambulate at least 79f using LRAD with min assist of 1 PT Short Term Goal 5 (Week 1): Pt will navigate 4 steps using HRs with mod assist of 1 Week 2:     Skilled Therapeutic Interventions/Progress Updates:      Therapy Documentation Precautions:  Precautions Precautions: Fall, Other (comment) Precaution Comments: R hemi, R inattention, aphasia, dysphagia Restrictions Weight Bearing Restrictions: No  Patient received seated in w/c at bedside and declines pain. Pt transported to main gym via w/c for time management and energy conversation. Pt participated in blocked practice of sit to stand and stand pivot transfers with RW and right hand splint from mat to standard chair with handrails. Pt required CGA for balance as pt displays difficulty with turns. Pt performed obstacle navigation x 15 feet (twice) with emphasis on turning around cones and requires CGA to min A for losses of balance that require PT assistance for safe recovery. Pt performed stair navigation x 8 (6 inch steps) with min A along with verbal cueing for LE placement. Pt presents with decreased foot clearance due to weak hip flexors and dorsiflexors therefore following activity focused on strengthening the above muscle groups. Pt performed seated cone taps with alternating legs ( 3 x 10) and left UE forward raises and right LE cone tap simultaneously ( 2 x 10) with emphasis on right UE  weightbearing on mat table. Pt ambulated from main gym to hospital room equaling a total of 250 ft with RW and right hand splint and min A for balance and obstacle navigation as pt has right side inattention deficits. Pt left seated in w/c at bedside with chair alarm on and all needs within reach.    Therapy/Group: Individual Therapy  SVerl DickerSVerl DickerPT, DPT  10/17/2021, 2:30 PM

## 2021-10-17 NOTE — Progress Notes (Signed)
Occupational Therapy Session Note  Patient Details  Name: Gerald Bauer MRN: [id number] Date of Birth: 03/13/1946  Today's Date: 10/17/2021 OT Individual Time: 9163-8466 OT Individual Time Calculation (min): 70 min    Short Term Goals: Week 1:  OT Short Term Goal 1 (Week 1): Pt will complete 1/3 toileting steps with no more than min A for standing balance OT Short Term Goal 2 (Week 1): Pt will thread BLE into pants with AE PRN with no more than CGA for sitting balance OT Short Term Goal 3 (Week 1): Pt with sit > stand in prep for ADL with min A using LRAD OT Short Term Goal 4 (Week 1): Pt will self-iniate RUE positioning for safety on 2 occurences with supervision  Skilled Therapeutic Interventions/Progress Updates:  Skilled OT intervention completed with focus on ADL retraining and functional endurance within a shower context, BUE AROM/strength. Pt received upright in bed, no c/o pain however indicating bruises on R arm from fall yesterday with pt attempting to explain what happened with some details difficult to understand 2/2 aphasia. Pt agreeable to shower this session. Of note- pt required increased cueing today for impulsivity with frequent education needed for safety, I.e. standing without assist, impatience with dressing tasks with decreased command following regarding sequence. Pt with expression via head nod that he is frustrated with the slowness of his motor control, and that's why he has gotten more "quick to do" vs slowing things down. Emotionally supported pt with therapist educating pt on benefits of thinking before moving.  Completed bed mobility with supervision, CGA sit > stand with HHA and CGA stand pivot to w/c. Dependent transfer into shower to tub bench, then using grab bars, pt stand pivot with min A from w/c <> tub bench. Doffed socks, shirt with supervision and pants with total A for time at stance level. Bathing completed at overall CGA for lateral lean for pericare as  well as for impuslive stand in shower for rinsing periarea. While in stance, pt noted to have active void (per nursing pt has had limited voiding- nurse made aware) requiring CGA in stance for duration. Therapist provided assist for [facility] management, however with use of mit, demonstrated improved ability to bathe using R hand this session.  Seated at sink pt completed UB dressing with lack of recall regarding hemi technique with re-education needed, however improvement with donning brief/pants with overall min A at the sit > stand level. Supervision A for donning socks after education regarding hand web method for hemi technique, with min A for shoes. Completed oral hygiene with supervision with cues needed for recall regarding stabilization method.  Seated in w/c, pt completed the following exercises to promote BUE strength needed for ADL management and functional use of dominant R hand: (With yellow theraband, on LUE only with therapist anchoring) -Bicep flexion x15 -Scapular retraction x15  (With RUE stabilizing) -external shoulder rotation x10 (AROM only on RUE) -Bicep flexion, with pt limited to only chest level, with goal of hand to mouth  Pt remained seated in w/c, with belt alarm on and all needs in reach at end of session.  Therapy Documentation Precautions:  Precautions Precautions: Fall, Other (comment) Precaution Comments: R hemi, R inattention, aphasia, dysphagia Restrictions Weight Bearing Restrictions: No    Therapy/Group: Individual Therapy  Blase Mess, MS, OTR/L  10/17/2021, 11:19 AM

## 2021-10-17 NOTE — Progress Notes (Signed)
Physical Therapy Weekly Progress Note  Patient Details  Name: Gerald Bauer MRN: [id number] Date of Birth: 28-Jan-1946  Beginning of progress report period: October 10, 2021 End of progress report period: October 17, 2021   Patient has met 5 out of 5 short term goals. Patient is making steady progress due to long term goals due to improvements in strength, balance, coordination, and activity tolerance. Pt requires supervision for bed mobility, CGA with transfers, min A with ambulation x 250 feet with RW and right hand splint and min A with stair negotiation x 8 (6 inch) with 1 handrail. Plan to continue to focus on pt and family education and will have formalized teaching session prior to discharge.   Patient continues to demonstrate the following deficits muscle weakness, impaired timing and sequencing, unbalanced muscle activation, decreased coordination, and decreased motor planning, decreased initiation, decreased attention, decreased awareness, decreased problem solving, decreased safety awareness, and delayed processing, and decreased standing balance, decreased postural control, and decreased balance strategies and therefore will continue to benefit from skilled PT intervention to increase functional independence with mobility.  Patient progressing toward long term goals..  Continue plan of care.  PT Short Term Goals Week 1:  PT Short Term Goal 1 (Week 1): Pt will perform supine<>sit with mod assist of 1 PT Short Term Goal 1 - Progress (Week 1): Met PT Short Term Goal 2 (Week 1): Pt will perform sit<>stands with min assist of 1 PT Short Term Goal 2 - Progress (Week 1): Met PT Short Term Goal 3 (Week 1): Pt will perform bed<>chair transfers using LRAD with min assist of 1 PT Short Term Goal 3 - Progress (Week 1): Met PT Short Term Goal 4 (Week 1): Pt will ambulate at least 4ft using LRAD with min assist of 1 PT Short Term Goal 4 - Progress (Week 1): Met PT Short Term Goal 5 (Week 1): Pt  will navigate 4 steps using HRs with mod assist of 1 PT Short Term Goal 5 - Progress (Week 1): Met Week 2:  PT Short Term Goal 1 (Week 2): Pt will ambulate 166ft with LRAD and CGA PT Short Term Goal 2 (Week 2): Pt will perform simulated car transfer with LRAD and CGA PT Short Term Goal 3 (Week 2): pt will perform bed mobility with supervision from flat bed without bedrails  Skilled Therapeutic Interventions/Progress Updates:  Ambulation/gait training;Cognitive remediation/compensation;Discharge planning;DME/adaptive equipment instruction;Functional mobility training;Pain management;Psychosocial support;Splinting/orthotics;Therapeutic Activities;UE/LE Strength taining/ROM;Visual/perceptual remediation/compensation;Balance/vestibular training;Community reintegration;Disease management/prevention;Functional electrical stimulation;Neuromuscular re-education;Patient/family education;Skin care/wound management;Stair training;Therapeutic Exercise;UE/LE Coordination activities;Wheelchair propulsion/positioning   Therapy Documentation Precautions:  Precautions Precautions: Fall, Other (comment) Precaution Comments: R hemi, R inattention, aphasia, dysphagia Restrictions Weight Bearing Restrictions: No    Therapy/Group: Individual Therapy  [name] 10/17/2021, 2:47 PM

## 2021-10-18 LAB — URINE CULTURE: Culture: NO GROWTH

## 2021-10-18 LAB — GLUCOSE, CAPILLARY
Glucose-Capillary: 135 mg/dL — ABNORMAL HIGH (ref 70–99)
Glucose-Capillary: 166 mg/dL — ABNORMAL HIGH (ref 70–99)
Glucose-Capillary: 116 mg/dL — ABNORMAL HIGH (ref 70–99)
Glucose-Capillary: 144 mg/dL — ABNORMAL HIGH (ref 70–99)
Glucose-Capillary: 130 mg/dL — ABNORMAL HIGH (ref 70–99)

## 2021-10-18 NOTE — Progress Notes (Signed)
Speech Language Pathology Daily Session Note  Patient Details  Name: Gerald Bauer MRN: [id number] Date of Birth: 05/17/1945  Today's Date: 10/18/2021 SLP Individual Time: 0920-1015 SLP Individual Time Calculation (min): 55 min  Short Term Goals: Week 2: SLP Short Term Goal 1 (Week 2): Patient will consume thin liquid trials with minimal s/sx of aspiration with min A verbal cues for small, single sips to indicate readiness for liquid advancement or MBSS. - Following Sup A verbal cues for oral care and Sup A for implementation of aspiration precautions, pt self-administered therapeutic PO trials of ice chips and ~3 oz of thin water via cup with minimal clinical s/sx concerning for airway invasion (throat clear x 1 resulting in strong, reflexive cough x 1). Vocal quality remained clear throughout. Pt appears ready for MBSS to assess readiness for diet and liquid advancement.  SLP Short Term Goal 2 (Week 2): Patient will express wants/needs via multimodal communication (gesture, pictures, yes/no responses) with min A verbal/visual cues. - Pt expressed wants/needs via multimodal communication (pictures, hand gestures, and yes/no responses) with Min A verbal and visual cues.   SLP Short Term Goal 3 (Week 2): Pt will verbally name common pictures or objects on 60% of occasions given Max A multimodal cues. - Pt verbally named common pictures of objects when given 2 to 3 written choice prompts to receptively ID picture on ~60% of occasions given Max A multimodal cues to include tactile articulatory prompting; approximated single syllable words with written target word + picture present on ~40% of pictures following Max A tactile cues for oral placemen of articulators.  SLP Short Term Goal 4 (Week 2): Pt will demonstrate reading comprehension at the sentence and paragraph level as evident by answering yes/no questions pertaining reading material with 80% accuracy and Min A. - Did not formally address this  session.   SLP Short Term Goal 5 (Week 2): Pt will correctly write 5 single funcitonal words on last step of CART protocol. - Pt correctly wrote 1 out of 5 single functional word on last step of CART protocol.  SLP Short Term Goal 6 (Week 2): Pt will self-correct 100% of errors during language tasks with Sup A. - Self-monitored and corrected errors on receptive ID task with Sup A; Total A during writing task.  Skilled Therapeutic Interventions: S: Pt seen this date for skilled ST intervention targeting deglutition and communication goals outlined above. Pt received with eyes closed and lying semi-reclined in bed; aroused when his name was called by clinician. Provided smile and head nod upon SLP arrival/greeting. Denies pain. Agreeable to ST intervention at bedside.   O: Please see above for objective data re: pt's performance on targeted goals. SLP provided ongoing, skilled re-education re: principles of neuroplasticity ("use it and improve it, don't use it and lose it."), use of multimodal communication to indicate wants + needs, and recommendations for MBSS to assess readiness for diet and liquid advancement in light of dehydration and improvement in tolerance of thin water during controlled trials. Continues to communicate wants and needs via non-verbal means to include via yes/no responses and hand gestures. During structured, therapeutic task., pt utilized 16-cell communication board to point to pictures, when given different functional scenarios, with Sup A, though has not been noted to utilized board within a functional context during ST sessions.  Pt was noted to verbalize automatic words and phrases, "thank you" and "good," this session. Continue to benefit from Mod A verbal and visual cues to increase vocal  intensity.   A: Pt remains stimulable for skilled ST intervention as evident by subtle improvements in verbal attempts and approximations of single syllable words with Max A multimodal cues,  tolerance of thin water via cup, and use of verbalizations for responses to yes and no questions. Will plan to complete MBSS next session to determine safest and least restrictive diet consistencies.  P: Pt left in direct care of nursing for toileting needs. Continue per current ST POC.   Pain None/Denies via yes/no response  Therapy/Group: Individual Therapy  Derico Mitton A Winnona Wargo 10/18/2021, 12:35 PM

## 2021-10-18 NOTE — Patient Care Conference (Signed)
Inpatient RehabilitationTeam Conference and Plan of Care Update Date: 10/18/2021   Time: 11:27 AM    Patient Name: Gerald Bauer      Medical Record Number: [id number]  Date of Birth: [birth year] Sex: Male         Room/Bed: 4W21C/4W21C-01 Payor Info: Payor: [employer] MEDICARE / Plan: [facility] MEDICARE / Product Type: *No Product type* /    Admit Date/Time:  10/09/2021  3:08 PM  Primary Diagnosis:  Left middle cerebral artery stroke [facility])  Hospital Problems: Principal Problem:   Left middle cerebral artery stroke [facility])    Expected Discharge Date: Expected Discharge Date: 10/31/21  Team Members Present: Physician leading conference: Dr. Leeroy Cha Social Worker Present: Erlene Quan, BSW Nurse Present: Dorien Chihuahua, RN PT Present: Ailene Rud, PT OT Present: Jennefer Bravo, OT SLP Present: Helaine Chess, SLP PPS Coordinator present : Gunnar Fusi, SLP     Current Status/Progress Goal Weekly Team Focus  Bowel/Bladder   Mixed Continence, and needs stimulation. LBM         Swallow/Nutrition/ Hydration             ADL's   Mod A bathing, min A UB dressing, Mod A Lb dressing, mod A toileting  supervision/CGA  safety awareness, RUE NMR, ADL retraining, general strengthening/endurance   Mobility   supervision bed mobility, transfers with RW and right hand splint CGA, gait 250 min RW and right hand splint, min stairs x 8 (6 inch) with 1 rail  CGA/supervision overall at ambulatory level  activity tolerance, R hemibody NMR, bed mobility, transfer, stair, gait training, pt/fam education, sitting and standing balance   Communication             Safety/Cognition/ Behavioral Observations            Pain   Denies pain  Right shoulder and knee pain addressed w voltaren gel < 2/10  Assess QS and prn   Skin   Skin Intact  Maintain integrity  Assess QS and prn     Discharge Planning:  discharging home with son to assist   Team Discussion: Patient with exp  aphasia and right side weakness with impulsivity post left MCA CVA. Also has inability to void without retention; encourage po intake and MD added flomax; constipation addressed.  Patient on target to meet rehab goals: yes, currently needs CGA for transfers and min assist for stairs, mod Has mod assist communication goals and CGA - supervision goals overall for discharge.   *See Care Plan and progress notes for long and short-term goals.   Revisions to Treatment Plan:  MBS 10/19/21   Teaching Needs: Safety, cues, transfers, toileting, medications, dietary modifications, etc  Current Barriers to Discharge: Decreased caregiver support  Possible Resolutions to Barriers: Family education OP follow up services     Medical Summary Current Status: decreased voiding frequency, type 2 diabetes with hyperglycemia, left shoulder pain, constipation, hypertension  Barriers to Discharge: Medical stability  Barriers to Discharge Comments: decreased voiding frequency, type 2 diabetes with hyperglycemia, left shoulder pain, constipation, hypertension Possible Resolutions to [employer] Focus: checked UA yesterday and was negative, started IVF yesterday, educated regarding drinking 6-8 glasses of water per day, continue flomax and amlodipine, voltaren   Continued Need for Acute Rehabilitation Level of Care: The patient requires daily medical management by a physician with specialized training in physical medicine and rehabilitation for the following reasons: Direction of a multidisciplinary physical rehabilitation program to maximize functional independence : Yes Medical management of patient  stability for increased activity during participation in an intensive rehabilitation regime.: Yes Analysis of laboratory values and/or radiology reports with any subsequent need for medication adjustment and/or medical intervention. : Yes   I attest that I was present, lead the team conference, and concur with  the assessment and plan of the team.   Dorien Chihuahua B 10/18/2021, 2:36 PM

## 2021-10-18 NOTE — Progress Notes (Signed)
Physical Therapy Session Note  Patient Details  Name: Gerald Bauer MRN: [id number] Date of Birth: Jun 04, 1945  Today's Date: 10/18/2021 PT Individual Time: 1122-1150 PT Individual Time Calculation (min): 28 min  and Today's Date: 10/18/2021 PT Missed Time: 17 Minutes Missed Time Reason: Bowel/bladder accident;Toileting  Short Term Goals: Week 1:  PT Short Term Goal 1 (Week 1): Pt will perform supine<>sit with mod assist of 1 PT Short Term Goal 1 - Progress (Week 1): Met PT Short Term Goal 2 (Week 1): Pt will perform sit<>stands with min assist of 1 PT Short Term Goal 2 - Progress (Week 1): Met PT Short Term Goal 3 (Week 1): Pt will perform bed<>chair transfers using LRAD with min assist of 1 PT Short Term Goal 3 - Progress (Week 1): Met PT Short Term Goal 4 (Week 1): Pt will ambulate at least 72ft using LRAD with min assist of 1 PT Short Term Goal 4 - Progress (Week 1): Met PT Short Term Goal 5 (Week 1): Pt will navigate 4 steps using HRs with mod assist of 1 PT Short Term Goal 5 - Progress (Week 1): Met Week 2:  PT Short Term Goal 1 (Week 2): Pt will ambulate 137ft with LRAD and CGA PT Short Term Goal 2 (Week 2): Pt will perform simulated car transfer with LRAD and CGA PT Short Term Goal 3 (Week 2): pt will perform bed mobility with supervision from flat bed without bedrails  Skilled Therapeutic Interventions/Progress Updates:      Therapy Documentation Precautions:  Precautions Precautions: Fall, Other (comment) Precaution Comments: R hemi, R inattention, aphasia, dysphagia Restrictions Weight Bearing Restrictions: No  Pt received seated in w/c at bedside and declines pain. Pt requires CGA for safety and balance with sit to stand and ambulation of 218 ft with RW and right hand splint. Pt demonstrates improved gait and no observed losses of balance. Pt able to appropriately navigate RW and negotiate obstacles. Pt performed 12 inch step up's ( 3 x 10) with right LE and bilateral  UE support along stair handrails. Pt negotiated 4 steps with min A with frequent verbal and visual cueing for LE placement. Pt had bowel incontinence episode and was returned to room via w/c for time management purposes. Pt requires CGA with sit to stand and stand pivot transfer from w/c toilet. Pt left on toilet in care of nursing. Pt missed total of 17 minutes of skilled physical therapy due to incontinence episode.     Therapy/Group: Individual Therapy  [name] [name] PT, DPT  10/18/2021, 7:37 AM

## 2021-10-18 NOTE — Progress Notes (Signed)
Physical Therapy Session Note  Patient Details  Name: Gerald Bauer MRN: [id number] Date of Birth: 1946/02/23  Today's Date: 10/18/2021 PT Individual Time: 0730-0757 PT Individual Time Calculation (min): 27 min   Short Term Goals: Week 1:  PT Short Term Goal 1 (Week 1): Pt will perform supine<>sit with mod assist of 1 PT Short Term Goal 1 - Progress (Week 1): Met PT Short Term Goal 2 (Week 1): Pt will perform sit<>stands with min assist of 1 PT Short Term Goal 2 - Progress (Week 1): Met PT Short Term Goal 3 (Week 1): Pt will perform bed<>chair transfers using LRAD with min assist of 1 PT Short Term Goal 3 - Progress (Week 1): Met PT Short Term Goal 4 (Week 1): Pt will ambulate at least 75f using LRAD with min assist of 1 PT Short Term Goal 4 - Progress (Week 1): Met PT Short Term Goal 5 (Week 1): Pt will navigate 4 steps using HRs with mod assist of 1 PT Short Term Goal 5 - Progress (Week 1): Met Week 2:  PT Short Term Goal 1 (Week 2): Pt will ambulate 1061fwith LRAD and CGA PT Short Term Goal 2 (Week 2): Pt will perform simulated car transfer with LRAD and CGA PT Short Term Goal 3 (Week 2): pt will perform bed mobility with supervision from flat bed without bedrails  Skilled Therapeutic Interventions/Progress Updates:   Received pt semi-reclined in bed asleep. Upon wakening, pt agreeable to PT treatment, and denied any pain during session but did not sleep well last night - communicating mostly via head nods. RN notified to disconnect IV and performed bladder scan; then requesting pt try to urinate. Pt transferred semi-reclined<>sitting EOB with supervision with HOB elevated and use of bedrails. Sit<>stand with RW and CGA and pt ambulated 1091f 2 trials with RW and CGA/min A in/out of bathroom. Pt required min/mod A for clothing management on R side and able to continue voiding with increased time. Took rest break EOB, then stood with RW and CGA and ambulated [id number] trials with RW  and min A to/from dayroom. Pt required cues for R attention to avoid running into obstacles on R side as well as for increased R step length due to poor foot clearance with increased fatigue. Returned to room and RN requested pt return to bed for additional bladder scan - sit<>supine with supervision and scooted to HOB [facility] with LUE on headboard. Concluded session with pt supine in bed, needs within reach, and bed alarm on.   Therapy Documentation Precautions:  Precautions Precautions: Fall, Other (comment) Precaution Comments: R hemi, R inattention, aphasia, dysphagia Restrictions Weight Bearing Restrictions: No  Therapy/Group: Individual Therapy AnnaAlfonse Alpers DPT  10/18/2021, 7:02 AM

## 2021-10-18 NOTE — Progress Notes (Signed)
Occupational Therapy Session Note  Patient Details  Name: Gerald Bauer MRN: [id number] Date of Birth: 1945/12/25  Today's Date: 10/18/2021 OT Individual Time: 7711-6579 OT Individual Time Calculation (min): 70 min    Short Term Goals: Week 1:  OT Short Term Goal 1 (Week 1): Pt will complete 1/3 toileting steps with no more than min A for standing balance OT Short Term Goal 2 (Week 1): Pt will thread BLE into pants with AE PRN with no more than CGA for sitting balance OT Short Term Goal 3 (Week 1): Pt with sit > stand in prep for ADL with min A using LRAD OT Short Term Goal 4 (Week 1): Pt will self-iniate RUE positioning for safety on 2 occurences with supervision OT Short Term Goal 4 - Progress (Week 1): Progressing toward goal  Skilled Therapeutic Interventions/Progress Updates:  Pt received seated in w/c, with no c/o pain, with magic cup on table and pt attempting to pick up spoon with R hand to feed himself however with inability to grasp, therefore start of session with emphasis on self-feeding with AE, then transitioned to R NMR and dynamic balance without AD all to promote independence with ADL management.  Transported dependently in w/c > speech office for emphasis on feeding within functional seated position. CGA sit > stand with HHA then min A stand pivot from w/c <> standard chair. Therapist donned universal cuff with total A, then utilized R hand to scoop magic cup contents on spoon with cues for internal rotation and radial deviation, however increased automatic active wrist extension noted with fewer cues needed to achieve this session. Pt with continued difficulty with full AROM from hand to mouth 2/2 bicep weakness, therefore therapist modified task by elevating pt's eating surface using foot block on table top, to maximize bicep facilitation within a gravity eliminated position. With this technique, pt was then able to achieve 90% of motion from scooping/hand to mouth for food  consumption with no physical assist provided from therapist. Able to consume half of magic cup with this method prior to pt indicating he was full. No signs of aspiration and followed magic cup with nectar thick sweet tea.  CGA stand pivot with HHA to EOM, then participated in dynamic balance and PNF pattern activity utilizing horse shoe reach and retrieval task from long mirror with use of L hand to R<>L sides. CGA needed for balance throughout, with greater knee extension noted in stance with no blocking needed.  For RUE weightbearing and NMR, pt utilized R hand on mid level mat table during cross body retrieval/return of squigz pieces with L hand. Therapist provided up to min A for balance when weight shifting towards R side, and min A needed for hand over hand stabilization assist over R hand. Did utilize push up block under R hand 2/2 lack of full finger extension needed for weight bearing. Then transitioned to modified standing push ups x10 at elevated table, at min A level only for stabilization of R hand and elbow throughout.  Concluded session with AAROM of BUE, using 1 pound dowel, coban wrapped around R hand for the following: -chest presses x10 -overhead presses x10 -reverse grip bicep flexion x15 Light stabilization, visual targets and verbal cues required for form and technique.  Transported pt dependently in w/c > room, then remained seated in w/c, with family present. All immediate needs met and in reach at end of session.  Therapy Documentation Precautions:  Precautions Precautions: Fall, Other (comment) Precaution Comments: R  hemi, R inattention, aphasia, dysphagia Restrictions Weight Bearing Restrictions: No    Therapy/Group: Individual Therapy  Blase Mess, MS, OTR/L  10/18/2021, 3:36 PM

## 2021-10-18 NOTE — Progress Notes (Signed)
Patient ID: Gerald Bauer, male   DOB: 1945/04/06, 76 y.o.   MRN: [id number] Team Conference Report to Patient/Family  Team Conference discussion was reviewed with the patient and caregiver, including goals, any changes in plan of care and target discharge date.  Patient and caregiver express understanding and are in agreement.  The patient has a target discharge date of 10/31/21.  Sw spoke with patient son to provide team conference updates. Son pleased with changed in visitation and updates. No additional questions or concerns sw will continue to follow up.  Dyanne Iha 10/18/2021, 11:50 AM

## 2021-10-18 NOTE — Progress Notes (Signed)
Patient slept fairly well this shift. IV fluids started last night as planned. Denies any pain. Bladder scan X 2 this shift. Values below 350 mls. PT assisted patient to bathroom this morning. Will follow up to document urine output if any. Day shift RN aware. Safety maintained.

## 2021-10-18 NOTE — Progress Notes (Addendum)
PROGRESS NOTE   Subjective/Complaints: UA/UC normal, now voiding after fluids and flomax. Still voiding infrequently but not retaining much on bladder scans- discussed 6-8 glasses of water per day  ROS: +shoulder pain- improved with voltaren, +urinary retention   Objective:   No results found.  No results for input(s): "WBC", "HGB", "HCT", "PLT" in the last 72 hours.  Recent Labs    10/16/21 0600  CREATININE 0.93     Intake/Output Summary (Last 24 hours) at 10/18/2021 0925 Last data filed at 10/18/2021 0917 Gross per 24 hour  Intake 620 ml  Output 200 ml  Net 420 ml        Physical Exam: Vital Signs Blood pressure 121/71, pulse 91, temperature 98.2 F (36.8 C), resp. rate 18, height '5\' 7"'$  (1.702 m), weight 74.5 kg, SpO2 95 %. Gen: no distress, normal appearing HEENT: oral mucosa pink and moist, NCAT Cardio: Reg rate Chest: normal effort, normal rate of breathing Abd: soft, non-distended Ext: no edema Psych: pleasant, normal affect, incredibly motivated Skin: intact Neurologic: Cranial nerves II through XII intact, motor strength is 5/5 in left deltoid, bicep, tricep, grip, hip flexor, knee extensors, ankle dorsiflexor and plantar flexor RUE 2- RLE 4/5 Sensory exam normal sensation to light touch and proprioception in bilateral upper and lower extremities Expressive aphasia ongoing, uses head nods primarily for me, tries to vocalize Impaired attention to right side, improving Impaired safety awareness Musc: right knee/shoulder discomfort with ROM, palpation     Assessment/Plan: 1. Functional deficits which require 3+ hours per day of interdisciplinary therapy in a comprehensive inpatient rehab setting. Physiatrist is providing close team supervision and 24 hour management of active medical problems listed below. Physiatrist and rehab team continue to assess barriers to discharge/monitor patient progress  toward functional and medical goals  Care Tool:  Bathing    Body parts bathed by patient: Right arm, Left arm, Chest, Abdomen, Front perineal area, Buttocks, Right upper leg, Left upper leg, Right lower leg, Left lower leg, Face   Body parts bathed by helper: Left arm, Front perineal area, Buttocks, Right lower leg, Left lower leg     Bathing assist Assist Level: Contact Guard/Touching assist     Upper Body Dressing/Undressing Upper body dressing   What is the patient wearing?: Pull over shirt    Upper body assist Assist Level: Minimal Assistance - Patient > 75%    Lower Body Dressing/Undressing Lower body dressing      What is the patient wearing?: Incontinence brief, Pants     Lower body assist Assist for lower body dressing: Minimal Assistance - Patient > 75%     Toileting Toileting    Toileting assist Assist for toileting: Moderate Assistance - Patient 50 - 74%     Transfers Chair/bed transfer  Transfers assist     Chair/bed transfer assist level: Contact Guard/Touching assist     Locomotion Ambulation   Ambulation assist      Assist level: Minimal Assistance - Patient > 75% Assistive device: Other (comment) (right hand splint) Max distance: 250 feet   Walk 10 feet activity   Assist     Assist level: Minimal Assistance - Patient > 75% Assistive  device: Walker-rolling, Other (comment) (right hand splint)   Walk 50 feet activity   Assist Walk 50 feet with 2 turns activity did not occur: Safety/medical concerns  Assist level: Minimal Assistance - Patient > 75% Assistive device: Walker-rolling (right hand splint)    Walk 150 feet activity   Assist Walk 150 feet activity did not occur: Safety/medical concerns  Assist level: Minimal Assistance - Patient > 75% Assistive device: Walker-rolling, Other (comment) (right hand splint)    Walk 10 feet on uneven surface  activity   Assist Walk 10 feet on uneven surfaces activity did not  occur: Safety/medical concerns         Wheelchair     Assist Is the patient using a wheelchair?: Yes Type of Wheelchair: Manual    Wheelchair assist level: Supervision/Verbal cueing Max wheelchair distance: 38f    Wheelchair 50 feet with 2 turns activity    Assist        Assist Level: Supervision/Verbal cueing   Wheelchair 150 feet activity     Assist      Assist Level: Dependent - Patient 0% (based on PT documentation that w/c mobility was used for transportation only)   Blood pressure 121/71, pulse 91, temperature 98.2 F (36.8 C), resp. rate 18, height '5\' 7"'$  (1.702 m), weight 74.5 kg, SpO2 95 %.  Medical Problem List and Plan: 1. 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   Currently ModA toileting   Max A with SLP, with ModA goals, currently supervision for water protocol 2.  Impaired mobility, ambulating 270 feet: d/c lovenox             -antiplatelet therapy: Aspirin 81 mg daily and Plavix 75 mg daily to continue indefinitely 3. Shoulder arthritis: voltaren gel ordered. Discussed with therapy that I do not usually see spikes in blood pressure with topical voltaren gel, we can try again today and assess for these changes in vitals. Tylenol as needed. Provided pain relief journal 4. Mood/Behavior/Sleep: Provide emotional support             -antipsychotic agents: N/A 5. Neuropsych/cognition: This patient is not capable of making decisions on his own behalf. 6. Skin/Wound Care: Routine skin checks 7. Fluids/Electrolytes/Nutrition: Routine in and outs with follow-up chemistries 8.  Hypertension.  Continue Norvasc 5 mg daily.  Monitor with increased mobility 9.  Diabetes mellitus.  Hemoglobin A1c 7.1.   Presently with SSI.  Patient maintained on Increase metformin to 1,000 mg BID  Discussed minimizing foods with added sugar  CBG (last 3)  Recent Labs    10/17/21 2107 10/18/21 0551 10/18/21 0802  GLUCAP 205* 135* 166*      10.  Hyperlipidemia.  Intolerant to statin. Continue Repatha 140 mg subcutaneous every 2 weeks prior to admission. 11.  History of prostate cancer/prostatectomy 2024.  Follow-up outpatient 12. Hypoalbuminemia: recommended choosing foods high in protein and low in added sugar.  13. Constipation: continue magnesium gluconate '250mg'$  HS. Provide dietary education. Resolved with sorbitol 14. Overweight BMI 25.72: provide dietary education 15. Expressive aphasia: continue SLP 16. 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 Urinary retention: start flomax HS 8. Fall yesterday: advised that flomax can cause falls by decreasing blood pressure, discussed this is why we give Flomax at night 20. Impaired problem solving and motor planning: continue SLP and OT    LOS: 9 days A FACE TO FACE EVALUATION WAS PERFORMED  Gerald Bauer 10/18/2021, 9:25 AM

## 2021-10-18 NOTE — Progress Notes (Signed)
Patient ID: Gerald Bauer, male   DOB: 01-28-1946, 76 y.o.   MRN: [id number] Team Conference Report to Patient/Family  Team Conference discussion was reviewed with the patient and caregiver, including goals, any changes in plan of care and target discharge date.  Patient and caregiver express understanding and are in agreement.  The patient has a target discharge date of 10/31/21.  Sw spoke with patient son and provided team conference updates. Son was concerned about patient intake. Reccommended for the patient to have 6-8 necator thick cups a day. Sw informed patient son of OP and DME recommendations. No additional questions or concerns, sw will continue to follow up.   Dyanne Iha 10/18/2021, 12:43 PM

## 2021-10-18 NOTE — Plan of Care (Signed)
  Problem: RH Bed Mobility Goal: LTG Patient will perform bed mobility with assist (PT) Description: LTG: Patient will perform bed mobility with assistance, with/without cues (PT). Flowsheets (Taken 10/18/2021 0710) LTG: Pt will perform bed mobility with assistance level of: (upgraded due to imporved strength/coordination) Independent with assistive device  Note: upgraded due to imporved strength/coordination   Problem: RH Car Transfers Goal: LTG Patient will perform car transfers with assist (PT) Description: LTG: Patient will perform car transfers with assistance (PT). Flowsheets (Taken 10/18/2021 0710) LTG: Pt will perform car transfers with assist:: (upgraded due to imporved strength, balance, and coordination) Supervision/Verbal cueing Note: upgraded due to imporved strength, balance, and coordination   Problem: RH Ambulation Goal: LTG Patient will ambulate in controlled environment (PT) Description: LTG: Patient will ambulate in a controlled environment, # of feet with assistance (PT). Flowsheets (Taken 10/18/2021 0710) LTG: Pt will ambulate in controlled environ  assist needed:: (upgraded due to improved strength, balance, and coordination) Supervision/Verbal cueing LTG: Ambulation distance in controlled environment: 121f with LRAD Note: upgraded due to improved strength, balance, and coordination Goal: LTG Patient will ambulate in home environment (PT) Description: LTG: Patient will ambulate in home environment, # of feet with assistance (PT). Flowsheets (Taken 10/18/2021 0710) LTG: Pt will ambulate in home environ  assist needed:: (upgraded due to improved strength, balance, and coordination) Supervision/Verbal cueing LTG: Ambulation distance in home environment: 540fwith LRAD

## 2021-10-19 ENCOUNTER — Inpatient Hospital Stay (HOSPITAL_COMMUNITY): Payer: Medicare Other

## 2021-10-19 LAB — GLUCOSE, CAPILLARY
Glucose-Capillary: 136 mg/dL — ABNORMAL HIGH (ref 70–99)
Glucose-Capillary: 182 mg/dL — ABNORMAL HIGH (ref 70–99)
Glucose-Capillary: 108 mg/dL — ABNORMAL HIGH (ref 70–99)

## 2021-10-19 MED ORDER — SENNOSIDES-DOCUSATE SODIUM 8.6-50 MG PO TABS
1.0000 | ORAL_TABLET | Freq: Every day | ORAL | Status: DC
  Administered 2021-10-19 – 2021-10-21 (×3): 1 via ORAL
  Filled 2021-10-19 (×3): qty 1

## 2021-10-19 MED ORDER — POLYETHYLENE GLYCOL 3350 17 G PO PACK
17.0000 g | PACK | Freq: Every day | ORAL | Status: DC
  Administered 2021-10-20 – 2021-10-27 (×8): 17 g via ORAL
  Filled 2021-10-19 (×11): qty 1

## 2021-10-19 MED ORDER — SORBITOL 70 % SOLN
30.0000 mL | Freq: Every day | Status: DC | PRN
  Filled 2021-10-19: qty 30

## 2021-10-19 NOTE — Progress Notes (Signed)
PROGRESS NOTE   Subjective/Complaints: No new complaints this morning Having BM while working with OT this morning and same thing occurred yesterday, discussed we can decrease stool softeners  ROS: +shoulder pain- improved with voltaren, +urinary retention, constipation improved   Objective:   No results found.  No results for input(s): "WBC", "HGB", "HCT", "PLT" in the last 72 hours.  No results for input(s): "NA", "K", "CL", "CO2", "GLUCOSE", "BUN", "CREATININE", "CALCIUM" in the last 72 hours.    Intake/Output Summary (Last 24 hours) at 10/19/2021 0942 Last data filed at 10/19/2021 0757 Gross per 24 hour  Intake 360 ml  Output --  Net 360 ml        Physical Exam: Vital Signs Blood pressure (!) 120/94, pulse 88, temperature 98.4 F (36.9 C), resp. rate 17, height '5\' 7"'$  (1.702 m), weight 74.5 kg, SpO2 96 %. Gen: no distress, normal appearing HEENT: oral mucosa pink and moist, NCAT Cardio: Reg rate Chest: normal effort, normal rate of breathing Abd: soft, non-distended Ext: no edema Psych: pleasant, normal affect, incredibly motivated Skin: intact Neurologic: Cranial nerves II through XII intact, motor strength is 5/5 in left deltoid, bicep, tricep, grip, hip flexor, knee extensors, ankle dorsiflexor and plantar flexor RUE 2- RLE 4/5 Sensory exam normal sensation to light touch and proprioception in bilateral upper and lower extremities Expressive aphasia ongoing, uses head nods primarily for me, tries to vocalize Impaired attention to right side, improving Impaired safety awareness Musc: right knee/shoulder discomfort with ROM, palpation GU: on commode having BM     Assessment/Plan: 1. Functional deficits which require 3+ hours per day of interdisciplinary therapy in a comprehensive inpatient rehab setting. Physiatrist is providing close team supervision and 24 hour management of active medical problems  listed below. Physiatrist and rehab team continue to assess barriers to discharge/monitor patient progress toward functional and medical goals  Care Tool:  Bathing    Body parts bathed by patient: Right arm, Left arm, Chest, Abdomen, Front perineal area, Buttocks, Right upper leg, Left upper leg, Right lower leg, Left lower leg, Face   Body parts bathed by helper: Left arm, Front perineal area, Buttocks, Right lower leg, Left lower leg     Bathing assist Assist Level: Contact Guard/Touching assist     Upper Body Dressing/Undressing Upper body dressing   What is the patient wearing?: Pull over shirt    Upper body assist Assist Level: Minimal Assistance - Patient > 75%    Lower Body Dressing/Undressing Lower body dressing      What is the patient wearing?: Incontinence brief, Pants     Lower body assist Assist for lower body dressing: Minimal Assistance - Patient > 75%     Toileting Toileting    Toileting assist Assist for toileting: Moderate Assistance - Patient 50 - 74%     Transfers Chair/bed transfer  Transfers assist     Chair/bed transfer assist level: Contact Guard/Touching assist     Locomotion Ambulation   Ambulation assist      Assist level: Contact Guard/Touching assist Assistive device: Other (comment) (right hand splint) Max distance: 320 feet   Walk 10 feet activity   Assist  Assist level: Contact Guard/Touching assist Assistive device: Walker-rolling, Other (comment) (right hand splint)   Walk 50 feet activity   Assist Walk 50 feet with 2 turns activity did not occur: Safety/medical concerns  Assist level: Contact Guard/Touching assist Assistive device: Walker-rolling, Other (comment) (right hand splint)    Walk 150 feet activity   Assist Walk 150 feet activity did not occur: Safety/medical concerns  Assist level: Contact Guard/Touching assist Assistive device: Walker-rolling, Other (comment) (right hand splint)     Walk 10 feet on uneven surface  activity   Assist Walk 10 feet on uneven surfaces activity did not occur: Safety/medical concerns         Wheelchair     Assist Is the patient using a wheelchair?: Yes Type of Wheelchair: Manual    Wheelchair assist level: Supervision/Verbal cueing Max wheelchair distance: 42f    Wheelchair 50 feet with 2 turns activity    Assist        Assist Level: Supervision/Verbal cueing   Wheelchair 150 feet activity     Assist      Assist Level: Dependent - Patient 0% (based on PT documentation that w/c mobility was used for transportation only)   Blood pressure (!) 120/94, pulse 88, temperature 98.4 F (36.9 C), resp. rate 17, height '5\' 7"'$  (1.702 m), weight 74.5 kg, SpO2 96 %.  Medical Problem List and Plan: 1. 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   Currently ModA toileting   Max A with SLP, with ModA goals, currently supervision for water protocol 2.  Impaired mobility, ambulating 270 feet: d/c lovenox             -antiplatelet therapy: Aspirin 81 mg daily and Plavix 75 mg daily to continue indefinitely 3. Shoulder arthritis: voltaren gel ordered. Discussed with therapy that I do not usually see spikes in blood pressure with topical voltaren gel, we can try again today and assess for these changes in vitals. Tylenol as needed. Provided pain relief journal 4. Mood/Behavior/Sleep: Provide emotional support             -antipsychotic agents: N/A 5. Neuropsych/cognition: This patient is not capable of making decisions on his own behalf. 6. Skin/Wound Care: Routine skin checks 7. Fluids/Electrolytes/Nutrition: Routine in and outs with follow-up chemistries 8.  Hypertension.   Continue Norvasc 5 mg daily.  Monitor with increased mobility 9.  Diabetes mellitus.  Hemoglobin A1c 7.1. d/c ISS. [facility]  Patient maintained on Increase metformin to 1,000 mg BID  Discussed minimizing foods with added sugar  CBG (last 3)  Recent Labs    10/18/21 1638 10/18/21 2026 10/19/21 0545  GLUCAP 144* 130* 136*      10.  Hyperlipidemia.  Intolerant to statin. Continue Repatha 140 mg subcutaneous every 2 weeks prior to admission. 11.  History of prostate cancer/prostatectomy 2024.  Follow-up outpatient 12. Hypoalbuminemia: recommended choosing foods high in protein and low in added sugar.  13. Constipation: continue magnesium gluconate '250mg'$  HS. Provide dietary education. Resolved with sorbitol. Decrease senna docusate HS to 1 tab.  14. Overweight BMI 25.72: provide dietary education. Provided daughter in law with list  of foods that can assist with weight loss.  15. Expressive aphasia: continue SLP 16. 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 Urinary retention: start flomax HS 84. Fall yesterday: advised that flomax can cause falls by decreasing blood pressure, discussed this is why we give Flomax at night 20. Impaired problem solving and motor planning: continue SLP and OT    LOS: 10 days A FACE TO FACE EVALUATION WAS PERFORMED  Gerald Bauer Gerald Bauer 10/19/2021, 9:42 AM

## 2021-10-19 NOTE — Progress Notes (Signed)
Notified Dan, PA about pts last BM. Verbal order given for Daily Miralax and PRN Sorbitol 49m.

## 2021-10-19 NOTE — Progress Notes (Signed)
Occupational Therapy Session Note  Patient Details  Name: Gerald Bauer MRN: [id number] Date of Birth: 1945-07-29  Session 1: Today's Date: 10/19/2021 OT Individual Time: 6754-4920 OT Individual Time Calculation (min): 43 min   Session 2: Today's Date: 10/19/2021 OT Individual Time: 1007-1219 OT Individual Time Calculation (min): 57 min   Short Term Goals: Week 1:  OT Short Term Goal 1 (Week 1): Pt will complete 1/3 toileting steps with no more than min A for standing balance OT Short Term Goal 2 (Week 1): Pt will thread BLE into pants with AE PRN with no more than CGA for sitting balance OT Short Term Goal 3 (Week 1): Pt with sit > stand in prep for ADL with min A using LRAD OT Short Term Goal 4 (Week 1): Pt will self-iniate RUE positioning for safety on 2 occurences with supervision OT Short Term Goal 4 - Progress (Week 1): Progressing toward goal  Skilled Therapeutic Interventions/Progress Updates:    Session 1: Pt received semi-reclined in bed and agreeable to OT session. Pt responding well throughout session to yes/no questions verbally and with head nods. Pt completed bed mobility with CGA and sat EOB. Stood with CGA with RW and stood at the sink to complete oral care. Maintained good static and dynamic standing balance when reaching for objects. Completed functional mobility to and from the rehab gym with RW and CGA overall both ways. Pt completed seated activities EOM to increase functional grasp and simulate bringing food to his mouth with his affected extremity. Pt grasping various sized wooden shapes. Therapist holding them and verbally cueing pt to grasp shape and transport into other hand. Pt finishing with reaching for a foam lego piece with affected extremity to increase shoulder flexion, elbow and wrist extension. Pt with difficulty reaching and bringing item to his chin. Requiring facilitation at beginning and end range. Pt left in w/c with alarm on, call bell in reach, and  all needs met.  Session 2: Pt received sitting in w/c and agreeable to OT session. Denies need to use the restroom or complete ADLs. Transported to rehab gym for time management. Stood from w/c with CGA- needing A only for R hand placement. In standing, used RUE to pull squigz off the window. Completed 3 sets of 6 squigz encouraging shoulder, forearm, wrist, and finger ROM and strength. Therapist providing support distally at wrist to stabilize as pt had difficulty reaching up. Pt able to pull off all squigz. Pt pointing outside and verbalizing he wanted to go outside. Transported outside for time management. Completed functional mobility outside with Min A d/t uneven surface and cueing to lift foot up. In seated, attempted multiple activities to increase [facility]. Had pt hold with affected hand and flip with stronger hand then attempted to have pt oppose thumb to move cards which was to challenging. Transported back inside for time management. Pt left in bed with call bell in reach. Family member present in the room and notified to not let pt get out of bed without assistance from staff.  Therapy Documentation Precautions:  Precautions Precautions: Fall, Other (comment) Precaution Comments: R hemi, R inattention, aphasia, dysphagia Restrictions Weight Bearing Restrictions: No  Therapy/Group: Individual Therapy  Catalina Lunger 10/19/2021, 3:28 PM

## 2021-10-19 NOTE — Progress Notes (Addendum)
Modified Barium Swallow Progress Note  Patient Details  Name: Gerald Bauer MRN: [id number] Date of Birth: 11-Mar-1946  Today's Date: 10/19/2021  Modified Barium Swallow completed.  Full report located under Chart Review in the Imaging Section.  Brief recommendations include the following:  Clinical Impression MBSS completed. Pt presents with overall mild oropharyngeal dysphagia. Oral phase c/b piecemeal deglutition, decreased mastication, decreased bolus cohesion, premature spillage, and weak lingual manipulation. Pharyngeal phase c/b reduced pharyngeal peristalsis + reduced BOT approximation to PPW, which results in mild to moderate vallecular and pyriform sinuses residuals (vallecula > pyriform sinuses for solids, pyriform sinuses > vallecula for liquids). No penetration nor aspiration appreciated with thin liquid via cup, Dysphagia 1 (puree), Dysphagia 2 (chopped), Dysphagia 3 (mechanical soft), or with 13 mm Barium tablet placed in puree. Once instance of sensed aspiration with intake of large bolus of thin liquid via straw due to swallow initiation delay to pyriform sinuses and mistiming of swallow-breath cycle; despite strong reflexive cough aspirant was not fully cleared from trachea. Pt's level of swallow initiation was noted to vary with liquid consumption (over base of epiglottis and at pyriform sinuses) with level of swallow initiation for solids to be consistent at the vallecula. Pharyngeal stasis was partially cleared with reflexive secondary swallow.   Given objective findings, pt's appears appropriate for continuation of Dysphagia 2 textures with advancement of liquids to thin via cup (NO STRAWS) given adherence to general aspiration precautions to include small bites/sips, alternating bites/sips, upright positioning, and lingual sweep for oral clearance. Continue medications whole with puree for safety. May advance solid textures at bedside.  Results reviewed with pt and RN.     Swallow Evaluation Recommendations   SLP Diet Recommendations: Dysphagia 2 (Fine chop) solids;Thin liquid - NO STRAWS   Liquid Administration via: Cup;No straw   Medication Administration: Whole meds with puree   Supervision: Patient able to self feed;Full supervision/cueing for compensatory strategies   Compensations: Slow rate;Small sips/bites;Minimize environmental distractions;Lingual sweep for clearance of pocketing, Alternate between bites and sips   Postural Changes: Remain semi-upright after after feeds/meals (Comment);Seated upright at 90 degrees   Oral Care Recommendations: Oral care BID   Gerald Bresee A Afnan Emberton 10/19/2021,10:36 AM

## 2021-10-19 NOTE — Progress Notes (Signed)
Physical Therapy Session Note  Patient Details  Name: Gerald Bauer MRN: [id number] Date of Birth: February 19, 1946  Today's Date: 10/19/2021 PT Individual Time: 0801-0900 PT Individual Time Calculation (min): 59 min   Short Term Goals: Week 1:  PT Short Term Goal 1 (Week 1): Pt will perform supine<>sit with mod assist of 1 PT Short Term Goal 1 - Progress (Week 1): Met PT Short Term Goal 2 (Week 1): Pt will perform sit<>stands with min assist of 1 PT Short Term Goal 2 - Progress (Week 1): Met PT Short Term Goal 3 (Week 1): Pt will perform bed<>chair transfers using LRAD with min assist of 1 PT Short Term Goal 3 - Progress (Week 1): Met PT Short Term Goal 4 (Week 1): Pt will ambulate at least 45ft using LRAD with min assist of 1 PT Short Term Goal 4 - Progress (Week 1): Met PT Short Term Goal 5 (Week 1): Pt will navigate 4 steps using HRs with mod assist of 1 PT Short Term Goal 5 - Progress (Week 1): Met Week 2:  PT Short Term Goal 1 (Week 2): Pt will ambulate 127ft with LRAD and CGA PT Short Term Goal 2 (Week 2): Pt will perform simulated car transfer with LRAD and CGA PT Short Term Goal 3 (Week 2): pt will perform bed mobility with supervision from flat bed without bedrails  Skilled Therapeutic Interventions/Progress Updates:      Therapy Documentation Precautions:  Precautions Precautions: Fall, Other (comment) Precaution Comments: R hemi, R inattention, aphasia, dysphagia Restrictions Weight Bearing Restrictions: No  Pt received semi-reclined in bed and declines pain. Pt requires supervision with rolling and supine to sit to the right side. Pt requires CGA for balance with STS and ambulation of 320 feet from hospital room to ortho gym. Pt requires verbal cues to increase foot clearance and widen base of support with turns. Pt took seated rest break and then performed car transfer with supervision for safety and was able to mobilize bilateral LE in and out of the simulated vehicle. Pt  ambulated 112 feet from ortho gym to main gym with RW and right hand splint and CGA for safety and balance. Prior to initiation of stair training, PT asked pt if he needed to use the restroom and he nodded his head in agreement. Pt transported in w/c to hospital room for time management and energy conservation and impulsively performed stand pivot CGA prior to locking of w/c brakes. PT instructed pt to make sure brakes are locked prior to initiation of movement. Pt had bowel movement and MD notified, as she rounded during session. Pt requires min A with toileting and initiated peri-care with R UE and was unable to complete task, therefore PT finished peri-care. Pt required mod A with stand pivot with RW and right hand splint from toilet to w/c as pt slightly impulsively with activity and displayed lateral loss of balance that required PT assist for recovery. Pt instructed to decrease his pace and widen base of support with turning to prevent scissoring and losses of balance. Pt required assistance for washing hands following toileting and left seated in w/c at bedside in care of nurse who agreed to reposition chair alarm on patient. Pt left with all needs in reach.    Therapy/Group: Individual Therapy  Gerald Bauer Gerald Bauer PT, DPT  10/19/2021, 7:26 AM

## 2021-10-20 LAB — GLUCOSE, CAPILLARY
Glucose-Capillary: 128 mg/dL — ABNORMAL HIGH (ref 70–99)
Glucose-Capillary: 174 mg/dL — ABNORMAL HIGH (ref 70–99)
Glucose-Capillary: 131 mg/dL — ABNORMAL HIGH (ref 70–99)

## 2021-10-20 NOTE — Progress Notes (Addendum)
Physical Therapy Session Note  Patient Details  Name: Gerald Bauer MRN: [id number] Date of Birth: 1945-06-17  Today's Date: 10/20/2021 PT Individual Time: 1414-1530 PT Individual Time Calculation (min): 76 min   Short Term Goals: Week 1:  PT Short Term Goal 1 (Week 1): Pt will perform supine<>sit with mod assist of 1 PT Short Term Goal 1 - Progress (Week 1): Met PT Short Term Goal 2 (Week 1): Pt will perform sit<>stands with min assist of 1 PT Short Term Goal 2 - Progress (Week 1): Met PT Short Term Goal 3 (Week 1): Pt will perform bed<>chair transfers using LRAD with min assist of 1 PT Short Term Goal 3 - Progress (Week 1): Met PT Short Term Goal 4 (Week 1): Pt will ambulate at least 54ft using LRAD with min assist of 1 PT Short Term Goal 4 - Progress (Week 1): Met PT Short Term Goal 5 (Week 1): Pt will navigate 4 steps using HRs with mod assist of 1 PT Short Term Goal 5 - Progress (Week 1): Met Week 2:  PT Short Term Goal 1 (Week 2): Pt will ambulate 116ft with LRAD and CGA PT Short Term Goal 2 (Week 2): Pt will perform simulated car transfer with LRAD and CGA PT Short Term Goal 3 (Week 2): pt will perform bed mobility with supervision from flat bed without bedrails  Skilled Therapeutic Interventions/Progress Updates:      Therapy Documentation Precautions:  Precautions Precautions: Fall, Other (comment) Precaution Comments: R hemi, R inattention, aphasia, dysphagia Restrictions Weight Bearing Restrictions: No  Pt received seated in w/c at bedside and declines pain. Pt self-propelled w/c with bilateral LE with occasional assist of bilateral UE throughout hospital for pre-community integration. Pt requires increased time and CGA to light min A for obstacle negotiation as pt has right side inattention deficits. Pt ambulated ~150 feet with RW and right hand splint on unlevel surfaces outside and required min A for balance as pt has difficulty with turning and obstacle negotiation.  PT engaged in conversation with patient. Pt able to respond with yes/no and attempted to verbalize his son's name and the city he is from. Pt propelled w/c at least 500 feet during session. Pt presents with decreased activity tolerance, strength, balance, coordination and motor planning deficits and would continue to benefit from pre-community integration activities prior to discharge.     Therapy/Group: Individual Therapy  [name] [name] PT, DPT  10/20/2021, 7:40 AM

## 2021-10-20 NOTE — Progress Notes (Addendum)
Speech Language Pathology Daily Session Note  Patient Details  Name: Gerald Bauer MRN: [id number] Date of Birth: [birth year]  Today's Date: 10/20/2021 SLP Individual Time: 5726-2035 SLP Individual Time Calculation (min): 45 min  Short Term Goals: Week 2: SLP Short Term Goal 1 (Week 2): Patient will consume current diet textures and thin liquids with minimal s/sx concerning for aspiration and will participate in Dysphagia 3 trials to continue to progress solid textures. - Pt consumed Dysphagia 2 and thin liquid consistencies during AM meal with minimal s/sx concerning for airway invasion to include subtle throat clearing.   SLP Short Term Goal 2 (Week 2): Patient will express wants/needs via multimodal communication (gesture, pictures, yes/no responses) with min A verbal/visual cues - Pt communicated immediate wants/needs within the context of daily routines (tolieting, dressing, meal set-up, eating) with use of gestures, yes/no responses, and binary verbal choice prompts given Sup A.   SLP Short Term Goal 3 (Week 2): Pt will verbally name common pictures or objects on 60% of occasions given Max A multimodal cues. - 57% accuracy for verbal approximation of initial /b/ CV words given Min-Mod A to include sentence completion prompts and verbal cues to increase vocal intensity.   SLP Short Term Goal 4 (Week 2): Pt will demonstrate reading comprehension at the sentence and paragraph level as evident by answering yes/no questions pertaining reading material with 80% accuracy and Min A. -  Did not formally address this session.   SLP Short Term Goal 5 (Week 2): Pt will correctly write 5 single funcitonal words on last step of CART protocol. - Did not formally address this session.   SLP Short Term Goal 6 (Week 2): Pt will self-correct 100% of errors during language tasks with Sup A. - Did not formally address this session.   Skilled Therapeutic Interventions: S: Pt seen this date for skilled ST  intervention targeting deglutition and communicate goals outlined above. Pt received sleeping in bed; aroused to voice. No c/o pain. Agreeable to ST intervention in hospital room. Asked pt if he would like to go to the bathroom, and responded yes via head nod. Transferred from bed<>w/c with Mod A stand pivot and w/c<>BSC. Minimal void in toilet, with dry brief. Total A for LB dressing management. Min A for UB dressing.     O: Please see above for objective date re: pt's performance on targeted goals. Overall, pt benefited from Sup A question cues to indicate wants and needs non-verbally within the context of daily routines and Mod A to verbally approximate CV words with initial /b/ phoneme. Re: deglutition, minimal s/sx concerning for airway invasion to include subtle throat clear between bites/sips. When pt was able to produce volitional voicing, it appeared clear and dry; respirations remained even and unlabored. Per chart review, HR elevated; however, SpO2 and RR WNL.  A: Pt appears somewhat stimulable for skilled ST interventions to include tactile articulatory cues, verbal/model prompts, and sentence completion prompts. Recommend continuation of current diet consistencies (Dysphagia 2 with thin liquids) with continuation of full supervision and close monitoring and adherence to aspiration precautions outlined at [facility] and reviewed with NT; she verbalized understanding.   P: Pt left in room with with hand off to NT to assistance with supervision. Continue per current ST POC.  Pain None/Denies  Therapy/Group: Individual Therapy  Lorraine Cimmino A Vara Mairena 10/20/2021, 1:05 PM

## 2021-10-20 NOTE — Progress Notes (Signed)
Occupational Therapy Weekly Progress Note  Patient Details  Name: Gerald Bauer MRN: [id number] Date of Birth: 1946-03-23  Beginning of progress report period: October 10, 2021 End of progress report period: October 20, 2021  Patient has met 4 of 4 short term goals. Pt is making great progress towards LTGs. He has progressed functional transfers from mod A stand pivot to Min A ambulatory using RW, is able to bathe at an overall min A level, dress at an overall min A level and requires mod assist for toileting tasks. Pt continues to demonstrate greater awareness of his RUE/RLE during functional mobility and tasks, remains consistent with self-initiation with use of his effected UE during ADLs, has greater AROM available in the shoulder/bicep flexors, wrist extensors and composite flexion. Pt remains slightly impulsive, with safety cues needed throughout sessions, but is making great progress towards his supervision level goals. Family has been present to observe pt's progress, but would benefit from further education/hands on training to prepare for discharge readiness.   Patient continues to demonstrate the following deficits: muscle weakness, decreased cardiorespiratoy endurance, decreased coordination and decreased motor planning, decreased awareness and decreased safety awareness, and decreased standing balance and hemiplegia and therefore will continue to benefit from skilled OT intervention to enhance overall performance with BADL and Reduce care partner burden.  Patient progressing toward long term goals..  Continue plan of care.  OT Short Term Goals Week 1:  OT Short Term Goal 1 (Week 1): Pt will complete 1/3 toileting steps with no more than min A for standing balance OT Short Term Goal 1 - Progress (Week 1): Met OT Short Term Goal 2 (Week 1): Pt will thread BLE into pants with AE PRN with no more than CGA for sitting balance OT Short Term Goal 2 - Progress (Week 1): Met OT Short Term Goal 3  (Week 1): Pt with sit > stand in prep for ADL with min A using LRAD OT Short Term Goal 3 - Progress (Week 1): Met OT Short Term Goal 4 (Week 1): Pt will self-iniate RUE positioning for safety on 2 occurences with supervision OT Short Term Goal 4 - Progress (Week 1): Met Week 2:  OT Short Term Goal 1 (Week 2): STG = LTG 2/2 ELOS    Gerald Bartle E Ellwood Steidle, MS, OTR/L  10/20/2021, 7:50 AM

## 2021-10-20 NOTE — Progress Notes (Signed)
Occupational Therapy Session Note  Patient Details  Name: Gerald Bauer MRN: [id number] Date of Birth: 12/17/1945  Today's Date: 10/20/2021 OT Individual Time: 8250-0370 & 4888-9169 OT Individual Time Calculation (min): 70 min & 26 min   Short Term Goals: Week 2:  OT Short Term Goal 1 (Week 2): STG = LTG 2/2 ELOS  Skilled Therapeutic Interventions/Progress Updates:  Session 1 Skilled OT intervention completed with focus on ADL retraining and functional endurance within a shower context. Pt received seated on commode with NT present, no c/o pain indicated throughout session. Brief was noted to be wet with large urinary incontinent void, with pt unable to further urinate on commode. Discussed enforcing timed toileting with MD when present for rounds.  Completed all stand pivots/ambulatory room level transfers completed using RW with CGA, with directional cues needed. Ambulated to tub bench with CGA shower transfer using grab bars. Able to bathe all body parts with use of mit on R hand, at the sit > stand level for posterior periarea with CGA only for stance otherwise supervision. Pt had increased awareness and active part in managing [facility] this session. CGA stand pivot to w/c then dependent transfer in w/c to sink.  Pt did require re-education and frequent cues for remembering hemi-technique for dressing and grooming tasks however was able to donn shirt at supervision level today, brief/shorts with min A overall in stance for efficiency with R side, and supervision for socks and shoes using figure 4 position. Brushed teeth, including managing all twist caps and containers with supervision. Pt with continued challenges motor planning when items are in L hand and remembering to transfer items to R hand for efficiency with tasks as well as when to use R hand functional vs stabilize.  Completed self-ROM for shoulder flexion x15 with supervision with good technique, to prevent stiffness of the RUE. Pt  remained seated in w/c, with belt alarm on and all needs in reach at end of session.  Session 2 Skilled OT intervention completed with focus on toileting independence. Pt received upright in bed, indicating no pain via head nod. Pt pointed to bathroom indicating he needed to use toilet, with pt transitioning with supervision to EOB, therapist donning shoes with total A 2/2 urgency. CGA sit > stand using RW then CGA ambulatory transfer to toilet, with pt impulsive swinging RW away from him with his R hand still attached via walker splint and leaning on L side on wall. Cues needed for safety and positioning however CGA for pivoting to BSC, then mod A doffing pants. With time, pt was continent void and BM. Completed toileting with CGA, with A only for balance, however required cues for sequencing and safety. Ambulated to sink, with pt again impulsively pushing RW away from him, with education provided further on why this is unsafe. Washed hands in stance with CGA, then min A stand pivot transfer without AD to w/c. Pt remained seated in w/c with belt alarm on and all needs in reach at end of session.    Therapy Documentation Precautions:  Precautions Precautions: Fall, Other (comment) Precaution Comments: R hemi, R inattention, aphasia, dysphagia Restrictions Weight Bearing Restrictions: No    Therapy/Group: Individual Therapy  Blase Mess, MS, OTR/L  10/20/2021, 1:33 PM

## 2021-10-20 NOTE — Progress Notes (Signed)
PROGRESS NOTE   Subjective/Complaints: No new complaints this morning Working with [place]. [place] notes that patient was incontinent in brief this morning. Bladder program ordered  ROS: +shoulder pain- improved with voltaren, +urinary retention, constipation improved, +urinary incontinence at times   Objective:   DG Swallowing Func-Speech Pathology  Result Date: 10/19/2021 Table formatting from the original result was not included. Objective Swallowing Evaluation: Type of Study: MBS-Modified Barium Swallow Study  Patient Details Name: Decari Duggar MRN: [id number] Date of Birth: Sep 02, 1945 Today's Date: 10/19/2021 Time: SLP Start Time (ACUTE ONLY): 25 -SLP Stop Time (ACUTE ONLY): 1405 SLP Time Calculation (min) (ACUTE ONLY): 10 min Past Medical History: Past Medical History: Diagnosis Date  Acid reflux   Arthritis   hands  Carotid atherosclerosis   Diabetes mellitus without complication (HCC)   Hyperlipemia   Hypertension   Prostate cancer ([place])   history  Tachycardia  Past Surgical History: Past Surgical History: Procedure Laterality Date  APPENDECTOMY  1973  CHOLECYSTECTOMY  2013  PROSTATECTOMY  2012 HPI: Pt  is a 76 year old male with history of left-sided carotid stenosis, non-insulin-dependent diabetes mellitus type 2, PAD, GERD, mild COPD, history of tobacco use, hyperlipidemia, hypertension, who presents emergency department for chief concerns of facial droop, expressive aphasia, and R sided weakness.  MRI/MRA Head and Neck Imaging: MRI HEAD       1. 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 No associated hemorrhage or significant regional mass effect.  2. Loss of normal flow voids throughout the left ICA and MCA,  consistent with slow flow and/or occlusion. Susceptibility artifact  within proximal left MCA branches consistent with intraluminal  thrombus.  3. Underlying mild chronic microvascular ischemic  disease. Tiny  remote left ACA distribution infarct.     MRA HEAD IMPRESSION:   "1. Interval near complete occlusion of the left ICA and MCA,  presumably acute in nature given the presence of the acute left MCA  territory infarct. Left A1 segment not well seen either, also likely  occluded.  2. Otherwise wide patency of the major intracranial arterial  circulation. No other hemodynamically significant or correctable  stenosis.     MRA NECK IMPRESSION:  1. 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 Some attenuated flow seen  distally within the cervical left ICA, with subsequent reocclusion  by the skull base.  2. 50% atheromatous stenosis at the origin of the cervical right  ICA. Otherwise wide patency of the right carotid artery system.  3. Wide patency of the vertebral arteries within the neck.".  Subjective: Pt alert, pleasant, and cooperative. Mainly non-verbal with exception of a few automatic utterances. Communicating via gestural communication and facial expression.  Recommendations for follow up therapy are one component of a multi-disciplinary discharge planning process, led by the attending physician.  Recommendations may be updated based on patient status, additional functional criteria and insurance authorization. Assessment / Plan / Recommendation   10/19/2021  10:34 AM Clinical Impressions Clinical Impression MBSS completed. Pt presents with overall mild oropharyngeal dysphagia. Oral phase c/b piecemeal deglutition, decreased mastication, decreased bolus cohesion, premature spillage,  and weak lingual manipulation. Pharyngeal phase c/b reduced pharyngeal peristalsis + reduced BOT approximation to PPW, which results in mild to moderate vallecular and pyriform sinuses residuals (vallecula > pyriform sinuses for solids, pyriform sinuses > vallecula for liquids). No penetration nor aspiration appreciated with thin liquid via cup, Dysphagia 1  (puree), Dysphagia 2 (chopped), Dysphagia 3 (mechanical soft), or with 13 mm Barium tablet placed in puree. Once instance of sensed aspiration with intake of large bolus of thin liquid via straw due to swallow initiation delay to pyriform sinuses and mistiming of swallow-breath cycle; despite strong reflexive cough aspirant was not fully cleared from trachea. Pt's level of swallow initiation was noted to vary with liquid consumption (over base of epiglottis and at pyriform sinuses) with level of swallow initiation for solids to be consistent at the vallecula. Pharyngeal stasis was partially cleared with reflexive secondary swallow.  Given objective findings, pt's appears appropriate for continuation of Dysphagia 2 textures with advancement of liquids to thin via cup (NO STRAWS) given adherence to general aspiration precautions to include small bites/sips, alternating bites/sips, upright positioning, and lingual sweep for oral clearance. Continue medications whole with puree for safety. May advance solid textures at bedside.  Results reviewed with pt and RN.  SLP Visit Diagnosis Dysphagia, oropharyngeal phase (R13.12);Aphasia (R47.01);Apraxia (R48.2);Dysarthria and anarthria (R47.1) Impact on safety and function Mild aspiration risk     10/09/2021   9:33 AM Treatment Recommendations Treatment Recommendations Therapy as outlined in treatment plan below     10/19/2021  10:35 AM Prognosis Prognosis for Safe Diet Advancement Good Barriers to Reach Goals Language deficits;Severity of deficits;Time post onset   10/19/2021  10:34 AM Diet Recommendations SLP Diet Recommendations Dysphagia 2 (Fine chop) solids;Thin liquid Liquid Administration via Cup;No straw Medication Administration Whole meds with puree Compensations Slow rate;Small sips/bites;Minimize environmental distractions;Lingual sweep for clearance of pocketing Postural Changes Remain semi-upright after after feeds/meals (Comment);Seated upright at 90 degrees      10/19/2021  10:34 AM Other Recommendations Oral Care Recommendations Oral care BID   10/09/2021   9:33 AM Frequency and Duration  Speech Therapy Frequency (ACUTE ONLY) min 3x week Treatment Duration 2 weeks     10/19/2021  10:30 AM Oral Phase Oral Phase Impaired Oral - Thin Teaspoon NT Oral - Thin Cup Decreased bolus cohesion;Premature spillage;Piecemeal swallowing;Weak lingual manipulation Oral - Thin Straw Weak lingual manipulation;Piecemeal swallowing;Decreased bolus cohesion;Premature spillage Oral - Puree WFL Oral - Mech Soft Impaired mastication;Weak lingual manipulation Oral - Regular NT Oral - Multi-Consistency NT Oral - Pill Premature spillage;Decreased bolus cohesion    10/19/2021  10:32 AM Pharyngeal Phase Pharyngeal Phase Impaired Pharyngeal- Thin Teaspoon NT Pharyngeal- Thin Cup Delayed swallow initiation-vallecula;Delayed swallow initiation-pyriform sinuses;Reduced pharyngeal peristalsis;Reduced tongue base retraction;Pharyngeal residue - valleculae;Pharyngeal residue - pyriform;Lateral channel residue Pharyngeal Material does not enter airway Pharyngeal- Thin Straw Delayed swallow initiation-vallecula;Delayed swallow initiation-pyriform sinuses;Reduced pharyngeal peristalsis;Reduced tongue base retraction;Penetration/Aspiration during swallow;Moderate aspiration;Pharyngeal residue - valleculae;Pharyngeal residue - pyriform;Lateral channel residue Pharyngeal Material enters airway, passes BELOW cords and not ejected out despite cough attempt by patient Pharyngeal- Puree Reduced pharyngeal peristalsis;Reduced tongue base retraction;Pharyngeal residue - valleculae;Pharyngeal residue - pyriform Pharyngeal Material does not enter airway Pharyngeal- Mechanical Soft Reduced pharyngeal peristalsis;Reduced tongue base retraction;Pharyngeal residue - valleculae;Pharyngeal residue - pyriform Pharyngeal Material does not enter airway Pharyngeal- Regular NT Pharyngeal- Multi-consistency NT Pharyngeal- Pill Reduced  pharyngeal peristalsis Pharyngeal Material does not enter airway    10/19/2021  10:34 AM Cervical Esophageal Phase  Cervical Esophageal Phase [facility] Bethany A Lutes 10/19/2021,  10:49 AM                      No results for input(s): "WBC", "HGB", "HCT", "PLT" in the last 72 hours.  No results for input(s): "NA", "K", "CL", "CO2", "GLUCOSE", "BUN", "CREATININE", "CALCIUM" in the last 72 hours.    Intake/Output Summary (Last 24 hours) at 10/20/2021 1019 Last data filed at 10/19/2021 2030 Gross per 24 hour  Intake 500 ml  Output --  Net 500 ml        Physical Exam: Vital Signs Blood pressure 128/84, pulse 85, temperature (!) 97.4 F (36.3 C), temperature source Oral, resp. rate 16, height '5\' 7"'$  (1.702 m), weight 74.5 kg, SpO2 96 %. Gen: no distress, normal appearing HEENT: oral mucosa pink and moist, NCAT Cardio: Reg rate Chest: normal effort, normal rate of breathing Abd: soft, non-distended Ext: no edema Psych: pleasant, normal affect, incredibly motivated Skin: intact Neurologic: Cranial nerves II through XII intact, motor strength is 5/5 in left deltoid, bicep, tricep, grip, hip flexor, knee extensors, ankle dorsiflexor and plantar flexor RUE 2- RLE 4/5 Sensory exam normal sensation to light touch and proprioception in bilateral upper and lower extremities Expressive aphasia ongoing, uses head nods primarily for me, tries to vocalize Impaired attention to right side, improving Impaired safety awareness Musc: right knee/shoulder discomfort with ROM, palpation Ambulating with RW CG GU: on commode having BM     Assessment/Plan: 1. Functional deficits which require 3+ hours per day of interdisciplinary therapy in a comprehensive inpatient rehab setting. Physiatrist is providing close team supervision and 24 hour management of active medical problems listed below. Physiatrist and rehab team continue to assess barriers to discharge/monitor patient progress toward functional and medical  goals  Care Tool:  Bathing    Body parts bathed by patient: Right arm, Left arm, Chest, Abdomen, Front perineal area, Buttocks, Right upper leg, Left upper leg, Right lower leg, Left lower leg, Face   Body parts bathed by helper: Left arm, Front perineal area, Buttocks, Right lower leg, Left lower leg     Bathing assist Assist Level: Contact Guard/Touching assist     Upper Body Dressing/Undressing Upper body dressing   What is the patient wearing?: Pull over shirt    Upper body assist Assist Level: Supervision/Verbal cueing    Lower Body Dressing/Undressing Lower body dressing      What is the patient wearing?: Incontinence brief, Pants     Lower body assist Assist for lower body dressing: Minimal Assistance - Patient > 75%     Toileting Toileting    Toileting assist Assist for toileting: Moderate Assistance - Patient 50 - 74%     Transfers Chair/bed transfer  Transfers assist     Chair/bed transfer assist level: Contact Guard/Touching assist     Locomotion Ambulation   Ambulation assist      Assist level: Contact Guard/Touching assist Assistive device: Other (comment) (right hand splint) Max distance: 320 feet   Walk 10 feet activity   Assist     Assist level: Contact Guard/Touching assist Assistive device: Walker-rolling, Other (comment) (right hand splint)   Walk 50 feet activity   Assist Walk 50 feet with 2 turns activity did not occur: Safety/medical concerns  Assist level: Contact Guard/Touching assist Assistive device: Walker-rolling, Other (comment) (right hand splint)    Walk 150 feet activity   Assist Walk 150 feet activity did not occur: Safety/medical concerns  Assist level: Contact Guard/Touching assist Assistive device: Walker-rolling, Other (comment) (  right hand splint)    Walk 10 feet on uneven surface  activity   Assist Walk 10 feet on uneven surfaces activity did not occur: Safety/medical concerns          Wheelchair     Assist Is the patient using a wheelchair?: Yes Type of Wheelchair: Manual    Wheelchair assist level: Supervision/Verbal cueing Max wheelchair distance: 41f    Wheelchair 50 feet with 2 turns activity    Assist        Assist Level: Supervision/Verbal cueing   Wheelchair 150 feet activity     Assist      Assist Level: Dependent - Patient 0% (based on PT documentation that w/c mobility was used for transportation only)   Blood pressure 128/84, pulse 85, temperature (!) 97.4 F (36.3 C), temperature source Oral, resp. rate 16, height '5\' 7"'$  (1.702 m), weight 74.5 kg, SpO2 96 %.  Medical Problem List and Plan: 1. 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   Currently ModA toileting   Max A with SLP, with ModA goals, currently supervision for water protocol 2.  Impaired mobility, ambulating 270 feet: d/c lovenox             -antiplatelet therapy: Aspirin 81 mg daily and Plavix 75 mg daily to continue indefinitely 3. Shoulder arthritis: voltaren gel ordered. Discussed with therapy that I do not usually see spikes in blood pressure with topical voltaren gel, we can try again today and assess for these changes in vitals. Tylenol as needed. Provided pain relief journal 4. Mood/Behavior/Sleep: Provide emotional support             -antipsychotic agents: N/A 5. Neuropsych/cognition: This patient is not capable of making decisions on his own behalf. 6. Skin/Wound Care: Routine skin checks 7. Fluids/Electrolytes/Nutrition: Routine in and outs with follow-up chemistries 8.  Hypertension.  Continue Norvasc 5 mg daily.  Monitor with increased mobility 9.  Diabetes mellitus.  Hemoglobin A1c 7.1. d/c ISS.  Patient maintained on Increase metformin to 1,000 mg BID  Decrease CBG checks to AC BID  Discussed minimizing foods with added sugar  CBG (last 3)  Recent Labs    10/19/21 1145 10/19/21 1630 10/20/21 0556  GLUCAP 182* 108* 128*      10.  Hyperlipidemia.  Intolerant to statin. Continue Repatha 140 mg subcutaneous every 2 weeks prior to admission. 11.  History of prostate cancer/prostatectomy 2024.  Follow-up outpatient 12. Hypoalbuminemia: recommended choosing foods high in protein and low in added sugar.  13. Constipation: continue magnesium gluconate '250mg'$  HS. Provide dietary education. Resolved with sorbitol. Decrease senna docusate HS to 1 tab.  14. Overweight BMI 25.72: provide dietary education. Provided daughter in law with list of foods that can assist with weight loss.  15. Expressive aphasia: continue SLP 16. 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 Urinary retention: continue flomax HS 134 Fall yesterday: advised that flomax can cause falls by decreasing blood pressure, discussed this is why we give Flomax at night 20. Impaired problem solving and motor planning: continue SLP  and OT    LOS: 11 days A FACE TO FACE EVALUATION WAS PERFORMED  Clide Deutscher Johan Creveling 10/20/2021, 10:19 AM

## 2021-10-21 LAB — GLUCOSE, CAPILLARY
Glucose-Capillary: 165 mg/dL — ABNORMAL HIGH (ref 70–99)
Glucose-Capillary: 167 mg/dL — ABNORMAL HIGH (ref 70–99)
Glucose-Capillary: 139 mg/dL — ABNORMAL HIGH (ref 70–99)

## 2021-10-21 MED ORDER — MAGNESIUM GLUCONATE 500 MG PO TABS
500.0000 mg | ORAL_TABLET | Freq: Every day | ORAL | Status: DC
  Administered 2021-10-21 – 2021-10-30 (×10): 500 mg via ORAL
  Filled 2021-10-21 (×10): qty 1

## 2021-10-21 MED ORDER — DICLOFENAC SODIUM 1 % EX GEL
2.0000 g | Freq: Four times a day (QID) | CUTANEOUS | Status: DC | PRN
  Administered 2021-10-22: 2 g via TOPICAL

## 2021-10-21 NOTE — Progress Notes (Signed)
Physical Therapy Session Note  Patient Details  Name: Gerald Bauer MRN: [id number] Date of Birth: February 13, 1946  Today's Date: 10/21/2021 PT Individual Time: 0915-1015 PT Individual Time Calculation (min): 60 min   Short Term Goals: Week 1:  PT Short Term Goal 1 (Week 1): Pt will perform supine<>sit with mod assist of 1 PT Short Term Goal 1 - Progress (Week 1): Met PT Short Term Goal 2 (Week 1): Pt will perform sit<>stands with min assist of 1 PT Short Term Goal 2 - Progress (Week 1): Met PT Short Term Goal 3 (Week 1): Pt will perform bed<>chair transfers using LRAD with min assist of 1 PT Short Term Goal 3 - Progress (Week 1): Met PT Short Term Goal 4 (Week 1): Pt will ambulate at least 3ft using LRAD with min assist of 1 PT Short Term Goal 4 - Progress (Week 1): Met PT Short Term Goal 5 (Week 1): Pt will navigate 4 steps using HRs with mod assist of 1 PT Short Term Goal 5 - Progress (Week 1): Met Week 2:  PT Short Term Goal 1 (Week 2): Pt will ambulate 166ft with LRAD and CGA PT Short Term Goal 2 (Week 2): Pt will perform simulated car transfer with LRAD and CGA PT Short Term Goal 3 (Week 2): pt will perform bed mobility with supervision from flat bed without bedrails Week 3:     Skilled Therapeutic Interventions/Progress Updates:   Pt received supine in bed and agreeable to PT. Supine>sit transfer with min assist and cues for RUE position. Upper and lower body dressing with min assist sitting EOB. Gait training with RW x164ft to rehab gym and supervision assist. Nustep reciprocal movement training x 8 minutes with cues for full ROM and attention to the affected UE. Gait training with AD 2 x 154ft with cues for improved step height and safety in turns. Dynamic gait training to weave through 8 cones x 2, side stepping R and L, forward/reverse gait and combination side stepping and retroversion. Each performed 12ft x 3 each direction with cues ofr posture, step width on the RLE and  improved safety in turns. Sit<> transfers throughout session with supervision assist. Patient returned to room and left sitting in [facility] with call bell in reach and all needs met.         Therapy Documentation Precautions:  Precautions Precautions: Fall, Other (comment) Precaution Comments: R hemi, R inattention, aphasia, dysphagia Restrictions Weight Bearing Restrictions: No  Vital Signs: Therapy Vitals Temp: (!) 97.5 F (36.4 C) Pulse Rate: (!) 110 Resp: 16 BP: (!) 123/91 Patient Position (if appropriate): Sitting Oxygen Therapy SpO2: 99 % O2 Device: Room Air Pain: denies  Therapy/Group: Individual Therapy  Lorie Phenix 10/21/2021, 4:07 PM

## 2021-10-21 NOTE — Progress Notes (Signed)
Occupational Therapy Session Note  Patient Details  Name: Gerald Bauer MRN: [id number] Date of Birth: May 14, 1945  Today's Date: 10/21/2021 OT Individual Time: 1300-1345 OT Individual Time Calculation (min): 45 min    Short Term Goals: Week 1:  OT Short Term Goal 1 (Week 1): Pt will complete 1/3 toileting steps with no more than min A for standing balance OT Short Term Goal 1 - Progress (Week 1): Met OT Short Term Goal 2 (Week 1): Pt will thread BLE into pants with AE PRN with no more than CGA for sitting balance OT Short Term Goal 2 - Progress (Week 1): Met OT Short Term Goal 3 (Week 1): Pt with sit > stand in prep for ADL with min A using LRAD OT Short Term Goal 3 - Progress (Week 1): Met OT Short Term Goal 4 (Week 1): Pt will self-iniate RUE positioning for safety on 2 occurences with supervision OT Short Term Goal 4 - Progress (Week 1): Met  Skilled Therapeutic Interventions/Progress Updates:    1:1. Pt received wth NT just beginning lunch. Pt works on self feeding while swallowing following instructions left from SLP to mix solids and purees and swallow hard. Pt uses RUE with MIN A to facilitate grasp and scoop motion with spoon and red foam handle for NMR. Pt uses head shakes and nods to indicate when ready for next bite. Good initiation present. Pt follows instructions well for lingual sweep to rid of extra food pocketed on R, however has 3 instances of coughing during alternating solds and liquids. Direct handoff to NT to finish supervising.  Therapy Documentation Precautions:  Precautions Precautions: Fall, Other (comment) Precaution Comments: R hemi, R inattention, aphasia, dysphagia Restrictions Weight Bearing Restrictions: No General:     Therapy/Group: Individual Therapy  Tonny Branch 10/21/2021, 1:47 PM

## 2021-10-21 NOTE — Progress Notes (Addendum)
Physical Therapy Session Note  Patient Details  Name: Gerald Bauer MRN: [id number] Date of Birth: Jul 29, 1945  Today's Date: 10/21/2021 PT Individual Time: 9437  -  1207, 46 min     Short Term Goals: Week 1:  PT Short Term Goal 1 (Week 1): Pt will perform supine<>sit with mod assist of 1 PT Short Term Goal 1 - Progress (Week 1): Met PT Short Term Goal 2 (Week 1): Pt will perform sit<>stands with min assist of 1 PT Short Term Goal 2 - Progress (Week 1): Met PT Short Term Goal 3 (Week 1): Pt will perform bed<>chair transfers using LRAD with min assist of 1 PT Short Term Goal 3 - Progress (Week 1): Met PT Short Term Goal 4 (Week 1): Pt will ambulate at least 48f using LRAD with min assist of 1 PT Short Term Goal 4 - Progress (Week 1): Met PT Short Term Goal 5 (Week 1): Pt will navigate 4 steps using HRs with mod assist of 1 PT Short Term Goal 5 - Progress (Week 1): Met Week 2:  PT Short Term Goal 1 (Week 2): Pt will ambulate 1061fwith LRAD and CGA PT Short Term Goal 2 (Week 2): Pt will perform simulated car transfer with LRAD and CGA PT Short Term Goal 3 (Week 2): pt will perform bed mobility with supervision from flat bed without bedrails  Skilled Therapeutic Interventions/Progress Updates:  Pt resting in bed.  He denied pain.  Using bed features, supine> sit with supervision.  Sit> stand to RW withCGA.  Stand pivot transfer to L iwht supervision.  neuromuscular re-education via forced use, multimodal cues for alternating reciprocal movement bil LEs via Kinetron at 25 cm/sec, in sitting x 2 minutes targeting quads, x 2 minutes targeting gluts in trunk flexion, without use of UEs.   Gait training , RW, CG> max assist on level tile x 60' while seeking sticky notes numbered 1-6, placed on R in environment.  Pt missed 1 sticky note, and with difficulty was able to indicate this when sticky notes were placed on a table.  During a turn to R , pt moved quickly, shifting wt to R before moving  RLE; he needed max assist to prevent fall.   Advanced gait x 20' with RW, kicking cones with R foot with mod cues to achieve hip and knee flexion, CGA.  Gait to return to room, x150' with CGA and mod cues for R foot clearance.  With fatigue, pt's R foot scuffed floor, q step.  At end of session, pt resting in wc with needs at hand and seat belt alarm set.     Therapy Documentation Precautions:  Precautions Precautions: Fall, Other (comment) Precaution Comments: R hemi, R inattention, aphasia, dysphagia Restrictions Weight Bearing Restrictions: No      Therapy/Group: Individual Therapy  Gerald Bauer 10/21/2021, 10:26 AM

## 2021-10-21 NOTE — Progress Notes (Signed)
PROGRESS NOTE   Subjective/Complaints: Has called to be take to bathroom and not yet received response No other complaints this morning Discussed removal of IV  ROS: +shoulder pain- improved with voltaren, +urinary retention- imrproved, constipation improved, +urinary incontinence at times   Objective:   No results found.  No results for input(s): "WBC", "HGB", "HCT", "PLT" in the last 72 hours.  No results for input(s): "NA", "K", "CL", "CO2", "GLUCOSE", "BUN", "CREATININE", "CALCIUM" in the last 72 hours.    Intake/Output Summary (Last 24 hours) at 10/21/2021 1510 Last data filed at 10/21/2021 1356 Gross per 24 hour  Intake 300 ml  Output --  Net 300 ml        Physical Exam: Vital Signs Blood pressure (!) 123/91, pulse (!) 110, temperature (!) 97.5 F (36.4 C), resp. rate 16, height '5\' 7"'$  (1.702 m), weight 74.5 kg, SpO2 99 %. Gen: no distress, normal appearing HEENT: oral mucosa pink and moist, NCAT Cardio:Tachycardia Chest: normal effort, normal rate of breathing Abd: soft, non-distended Ext: no edema Psych: pleasant, normal affect, incredibly motivated Skin: intact Neurologic: Cranial nerves II through XII intact, motor strength is 5/5 in left deltoid, bicep, tricep, grip, hip flexor, knee extensors, ankle dorsiflexor and plantar flexor RUE 2- RLE 4/5 Sensory exam normal sensation to light touch and proprioception in bilateral upper and lower extremities Expressive aphasia ongoing, uses head nods primarily for me, tries to vocalize Impaired attention to right side, improving Impaired safety awareness Musc: right knee/shoulder discomfort with ROM, palpation Ambulating with RW CG GU: on commode having BM     Assessment/Plan: 1. Functional deficits which require 3+ hours per day of interdisciplinary therapy in a comprehensive inpatient rehab setting. Physiatrist is providing close team supervision and 24  hour management of active medical problems listed below. Physiatrist and rehab team continue to assess barriers to discharge/monitor patient progress toward functional and medical goals  Care Tool:  Bathing    Body parts bathed by patient: Right arm, Left arm, Chest, Abdomen, Front perineal area, Buttocks, Right upper leg, Left upper leg, Right lower leg, Left lower leg, Face   Body parts bathed by helper: Left arm, Front perineal area, Buttocks, Right lower leg, Left lower leg     Bathing assist Assist Level: Contact Guard/Touching assist     Upper Body Dressing/Undressing Upper body dressing   What is the patient wearing?: Pull over shirt    Upper body assist Assist Level: Supervision/Verbal cueing    Lower Body Dressing/Undressing Lower body dressing      What is the patient wearing?: Incontinence brief, Pants     Lower body assist Assist for lower body dressing: Minimal Assistance - Patient > 75%     Toileting Toileting    Toileting assist Assist for toileting: Contact Guard/Touching assist     Transfers Chair/bed transfer  Transfers assist     Chair/bed transfer assist level: Contact Guard/Touching assist     Locomotion Ambulation   Ambulation assist      Assist level: Contact Guard/Touching assist Assistive device: Walker-rolling Max distance: 150   Walk 10 feet activity   Assist     Assist level: Contact Guard/Touching assist Assistive device:  Walker-rolling   Walk 50 feet activity   Assist Walk 50 feet with 2 turns activity did not occur: Safety/medical concerns  Assist level: Contact Guard/Touching assist Assistive device: Walker-rolling    Walk 150 feet activity   Assist Walk 150 feet activity did not occur: Safety/medical concerns  Assist level: Contact Guard/Touching assist Assistive device: Walker-rolling    Walk 10 feet on uneven surface  activity   Assist Walk 10 feet on uneven surfaces activity did not occur:  Safety/medical concerns   Assist level: Minimal Assistance - Patient > 75% Assistive device: Walker-rolling, Other (comment) (R hand splint)   Wheelchair     Assist Is the patient using a wheelchair?: Yes Type of Wheelchair: Manual    Wheelchair assist level: Minimal Assistance - Patient > 75% Max wheelchair distance: 500 feet or greater    Wheelchair 50 feet with 2 turns activity    Assist        Assist Level: Minimal Assistance - Patient > 75%   Wheelchair 150 feet activity     Assist      Assist Level: Minimal Assistance - Patient > 75%   Blood pressure (!) 123/91, pulse (!) 110, temperature (!) 97.5 F (36.4 C), resp. rate 16, height '5\' 7"'$  (1.702 m), weight 74.5 kg, SpO2 99 %.  Medical Problem List and Plan: 1. 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   Currently ModA toileting   Max A with SLP, with ModA goals, currently supervision for water protocol 2.  Impaired mobility, ambulating 270 feet: d/c lovenox             -antiplatelet therapy: Aspirin 81 mg daily and Plavix 75 mg daily to continue indefinitely 3. Shoulder arthritis: voltaren gel ordered, changed to prn. Discussed with therapy that I do not usually see spikes in blood pressure with topical voltaren gel, we can try again today and assess for these changes in vitals. Tylenol as needed. Provided pain relief journal 4. Mood/Behavior/Sleep: Provide emotional support             -antipsychotic agents: N/A 5. Neuropsych/cognition: This patient is not capable of making decisions on his own behalf. 6. Skin/Wound Care: Routine skin checks 7. Fluids/Electrolytes/Nutrition: Routine in and outs with follow-up chemistries 8.  Hypertension.  Continue  Norvasc 5 mg daily.  Monitor with increased mobility 9.  Diabetes mellitus.  Hemoglobin A1c 7.1. d/c ISS. Patient maintained on Increase metformin to 1,000 mg BID  Decrease CBG checks to AC BID  Discussed minimizing foods with added sugar  CBG (last 3)  Recent Labs    10/20/21 1638 10/21/21 0622 10/21/21 1220  GLUCAP 131* 165* 167*      10.  Hyperlipidemia.  Intolerant to statin. Continue Repatha 140 mg subcutaneous every 2 weeks prior to admission. 11.  History of prostate cancer/prostatectomy 2024.  Follow-up outpatient 12. Hypoalbuminemia: recommended choosing foods high in protein and low in added sugar.  13. Constipation: increase magnesium gluconate to '500mg'$  HS. Provide dietary education. Resolved with sorbitol. Decrease senna docusate HS to 1 tab.  14. Overweight BMI 25.72: provide dietary education. Provided daughter in law with list of foods  that can assist with weight loss.  15. Expressive aphasia: continue SLP 16. 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 Urinary retention: continue flomax HS 75. Fall yesterday: advised that flomax can cause falls by decreasing blood pressure, discussed this is why we give Flomax at night 20. Impaired problem solving and motor planning: continue SLP and OT 21. Tachycardia: Increase magnesium gluconate to '500mg'$  HS    LOS: 12 days A FACE TO FACE EVALUATION WAS PERFORMED  Emil Klassen P Eloina Ergle 10/21/2021, 3:10 PM

## 2021-10-22 LAB — GLUCOSE, CAPILLARY
Glucose-Capillary: 144 mg/dL — ABNORMAL HIGH (ref 70–99)
Glucose-Capillary: 121 mg/dL — ABNORMAL HIGH (ref 70–99)

## 2021-10-22 NOTE — Progress Notes (Signed)
PROGRESS NOTE   Subjective/Complaints: Stool now loose: d/c HS senna-docusate Tolerating therapy very well Discussed that I have changed voltaren to prn  ROS: +shoulder pain- improved with voltaren, +urinary retention- imrproved, constipation improved/now stools are loose, +urinary incontinence at times   Objective:   No results found.  No results for input(s): "WBC", "HGB", "HCT", "PLT" in the last 72 hours.  No results for input(s): "NA", "K", "CL", "CO2", "GLUCOSE", "BUN", "CREATININE", "CALCIUM" in the last 72 hours.    Intake/Output Summary (Last 24 hours) at 10/22/2021 1157 Last data filed at 10/22/2021 0850 Gross per 24 hour  Intake 420 ml  Output 250 ml  Net 170 ml        Physical Exam: Vital Signs Blood pressure 109/84, pulse 97, temperature (!) 97.5 F (36.4 C), resp. rate 17, height '5\' 7"'$  (1.702 m), weight 74.5 kg, SpO2 99 %. Gen: no distress, normal appearing HEENT: oral mucosa pink and moist, NCAT Cardio:Tachycardia Chest: normal effort, normal rate of breathing Abd: soft, non-distended Ext: no edema Psych: pleasant, normal affect, incredibly motivated Skin: intact Neurologic: Cranial nerves II through XII intact, motor strength is 5/5 in left deltoid, bicep, tricep, grip, hip flexor, knee extensors, ankle dorsiflexor and plantar flexor RUE 2- RLE 4/5 Sensory exam normal sensation to light touch and proprioception in bilateral upper and lower extremities Expressive aphasia ongoing, uses head nods primarily for me, tries to vocalize Impaired attention to right side, improving Impaired safety awareness Musc: right knee/shoulder discomfort with ROM, palpation Ambulating with RW CG GU: on commode having BM, stools are loose     Assessment/Plan: 1. Functional deficits which require 3+ hours per day of interdisciplinary therapy in a comprehensive inpatient rehab setting. Physiatrist is providing  close team supervision and 24 hour management of active medical problems listed below. Physiatrist and rehab team continue to assess barriers to discharge/monitor patient progress toward functional and medical goals  Care Tool:  Bathing    Body parts bathed by patient: Right arm, Left arm, Chest, Abdomen, Front perineal area, Buttocks, Right upper leg, Left upper leg, Right lower leg, Left lower leg, Face   Body parts bathed by helper: Left arm, Front perineal area, Buttocks, Right lower leg, Left lower leg     Bathing assist Assist Level: Contact Guard/Touching assist     Upper Body Dressing/Undressing Upper body dressing   What is the patient wearing?: Pull over shirt    Upper body assist Assist Level: Supervision/Verbal cueing    Lower Body Dressing/Undressing Lower body dressing      What is the patient wearing?: Incontinence brief, Pants     Lower body assist Assist for lower body dressing: Minimal Assistance - Patient > 75%     Toileting Toileting    Toileting assist Assist for toileting: Contact Guard/Touching assist     Transfers Chair/bed transfer  Transfers assist     Chair/bed transfer assist level: Contact Guard/Touching assist     Locomotion Ambulation   Ambulation assist      Assist level: Contact Guard/Touching assist Assistive device: Walker-rolling Max distance: 150   Walk 10 feet activity   Assist     Assist level: Contact Guard/Touching assist  Assistive device: Walker-rolling   Walk 50 feet activity   Assist Walk 50 feet with 2 turns activity did not occur: Safety/medical concerns  Assist level: Contact Guard/Touching assist Assistive device: Walker-rolling    Walk 150 feet activity   Assist Walk 150 feet activity did not occur: Safety/medical concerns  Assist level: Contact Guard/Touching assist Assistive device: Walker-rolling    Walk 10 feet on uneven surface  activity   Assist Walk 10 feet on uneven  surfaces activity did not occur: Safety/medical concerns   Assist level: Minimal Assistance - Patient > 75% Assistive device: Walker-rolling, Other (comment) (R hand splint)   Wheelchair     Assist Is the patient using a wheelchair?: Yes Type of Wheelchair: Manual    Wheelchair assist level: Minimal Assistance - Patient > 75% Max wheelchair distance: 500 feet or greater    Wheelchair 50 feet with 2 turns activity    Assist        Assist Level: Minimal Assistance - Patient > 75%   Wheelchair 150 feet activity     Assist      Assist Level: Minimal Assistance - Patient > 75%   Blood pressure 109/84, pulse 97, temperature (!) 97.5 F (36.4 C), resp. rate 17, height '5\' 7"'$  (1.702 m), weight 74.5 kg, SpO2 99 %.  Medical Problem List and Plan: 1. 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   Currently ModA toileting    [place] SLP goals, currently supervision for water protocol 2.  Impaired mobility, ambulating 270 feet: d/c lovenox. Continue Aspirin 81 mg daily and Plavix 75 mg daily to continue indefinitely 3. Shoulder arthritis: voltaren gel ordered, changed to prn. Discussed with patient. Discussed with therapy that I do not usually see spikes in blood pressure with topical voltaren gel, we can try again today and assess for these changes in vitals. Tylenol as needed. Provided pain relief journal 4. Mood/Behavior/Sleep: Provide emotional support             -antipsychotic agents: N/A 5. Neuropsych/cognition: This patient is not capable of making decisions on his own behalf. 6. Skin/Wound Care: Routine skin checks 7. Fluids/Electrolytes/Nutrition: Routine in and outs with follow-up chemistries 8.  Hypertension.   Continue Norvasc 5 mg daily.  Monitor with increased mobility 9.  Diabetes mellitus.  Hemoglobin A1c 7.1. d/c ISS. Patient maintained on Increase metformin to 1,000 mg BID  Decrease CBG checks to AC BID  Discussed minimizing foods with added sugar  CBG (last 3)  Recent Labs    10/21/21 1220 10/21/21 1651 10/22/21 0555  GLUCAP 167* 139* 144*      10.  Hyperlipidemia.  Intolerant to statin. Continue Repatha 140 mg subcutaneous every 2 weeks prior to admission. 11.  History of prostate cancer/prostatectomy 2024.  Follow-up outpatient 12. Hypoalbuminemia: recommended choosing foods high in protein and low in added sugar.  13. Constipation: increase magnesium gluconate to '500mg'$  HS. Provide dietary education. Resolved with sorbitol. D/c senna-docusate 14. Overweight BMI 25.72: provide dietary education. Provided daughter in law with list of foods that can assist with weight loss.  15. Expressive aphasia: continue SLP 16. 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 Urinary retention: continue flomax HS 65. Fall yesterday: advised that flomax can cause falls by decreasing blood pressure, discussed this is why we give Flomax at night 20. Impaired problem solving and motor planning: continue SLP and OT 21. Tachycardia: Increase magnesium gluconate to '500mg'$  HS    LOS: 13 days A FACE TO FACE EVALUATION WAS PERFORMED  Clide Deutscher Dakota Vanwart 10/22/2021, 11:57 AM

## 2021-10-22 NOTE — Progress Notes (Signed)
Physical Therapy Session Note  Patient Details  Name: Gerald Bauer MRN: [id number] Date of Birth: [birth year]  Today's Date: 10/22/2021 PT Individual Time: 0915-1000 PT Individual Time Calculation (min): 45 min   Short Term Goals: Week 1:  PT Short Term Goal 1 (Week 1): Pt will perform supine<>sit with mod assist of 1 PT Short Term Goal 1 - Progress (Week 1): Met PT Short Term Goal 2 (Week 1): Pt will perform sit<>stands with min assist of 1 PT Short Term Goal 2 - Progress (Week 1): Met PT Short Term Goal 3 (Week 1): Pt will perform bed<>chair transfers using LRAD with min assist of 1 PT Short Term Goal 3 - Progress (Week 1): Met PT Short Term Goal 4 (Week 1): Pt will ambulate at least 34f using LRAD with min assist of 1 PT Short Term Goal 4 - Progress (Week 1): Met PT Short Term Goal 5 (Week 1): Pt will navigate 4 steps using HRs with mod assist of 1 PT Short Term Goal 5 - Progress (Week 1): Met Week 2:  PT Short Term Goal 1 (Week 2): Pt will ambulate 1010fwith LRAD and CGA PT Short Term Goal 2 (Week 2): Pt will perform simulated car transfer with LRAD and CGA PT Short Term Goal 3 (Week 2): pt will perform bed mobility with supervision from flat bed without bedrails  Skilled Therapeutic Interventions/Progress Updates:    Session focused on functional transfers with focus on safety and controlled technique to improve awareness and NMR to address postural control retraining, balance, attention to R, and overall balance retraining. Pt performed bed mobility using rail to the R with supervision. Total ssist to don shoes for time management. Performed min assist stand pivot transfer to the R with uncontrolled descent due to LOB to the R due to impulsivity.   Used [employer] for NMeBayetraining as described above with:  limits of stability program: Trial 1 and 2 on non compliant surface with score of 30% (initially on medium skill level to hard) Trial 3 and 4 on compliant surface level 10 and  then progressed to level 8 with scores of 59% and 58% respectively on hard skill level  Random Control Program: Trial 5 and 6 on compliant surface level 10 and then progressed to level 8 with scores of 83% and 82% respecitivley on medium skill level  Increased difficulty noted with anterior weightshift, impulsive nature with stepping up/down onto step with min assist and 1 episode of LOB requiring mod assist to recover. Pt noted to also rely heavily on use of arms to weightshift despite cues (verbal and tactile) to lead through hips/pelvis. BOS narrowed at times.   Therapy Documentation Precautions:  Precautions Precautions: Fall, Other (comment) Precaution Comments: R hemi, R inattention, aphasia, dysphagia Restrictions Weight Bearing Restrictions: No   Pain:  Denies pain    Therapy/Group: Individual Therapy  GrCanary BrimrIvory BroadPT, DPT, CBIS  10/22/2021, 10:47 AM

## 2021-10-22 NOTE — Progress Notes (Signed)
Pt did dig stem before inserting suppository himself.

## 2021-10-23 LAB — GLUCOSE, CAPILLARY
Glucose-Capillary: 140 mg/dL — ABNORMAL HIGH (ref 70–99)
Glucose-Capillary: 161 mg/dL — ABNORMAL HIGH (ref 70–99)
Glucose-Capillary: 134 mg/dL — ABNORMAL HIGH (ref 70–99)

## 2021-10-23 LAB — CREATININE, SERUM
Creatinine, Ser: 0.81 mg/dL (ref 0.61–1.24)
GFR, Estimated: >60 mL/min (ref >60)

## 2021-10-23 NOTE — Progress Notes (Signed)
PROGRESS NOTE   Subjective/Complaints:  Pt reports LBM yesterday- has mild pain in L side- mild- doesn't want tylenol or any pain meds for it.  Denies any issues otherwise.    ROS: limited by aphasia  Objective:   No results found.  No results for input(s): "WBC", "HGB", "HCT", "PLT" in the last 72 hours.  Recent Labs    10/23/21 0525  CREATININE 0.81      Intake/Output Summary (Last 24 hours) at 10/23/2021 0940 Last data filed at 10/23/2021 0910 Gross per 24 hour  Intake 660 ml  Output --  Net 660 ml        Physical Exam: Vital Signs Blood pressure (!) 133/95, pulse 95, temperature 98 F (36.7 C), resp. rate 17, height '5\' 7"'$  (1.702 m), weight 74.5 kg, SpO2 97 %.    General: awake, alert, appropriate, supine in bed; NT in room; NAD HENT: conjugate gaze; oropharynx moist CV: regular /borderline tachycardic rate; no JVD Pulmonary: CTA B/L; no W/R/R- good air movement GI: soft, NT, ND, (+)BS Psychiatric: appropriate- flat affect Neurological: alert- aphasic- very few words spoken Skin: intact Neurologic: Cranial nerves II through XII intact, motor strength is 5/5 in left deltoid, bicep, tricep, grip, hip flexor, knee extensors, ankle dorsiflexor and plantar flexor RUE 2- RLE 4/5 Sensory exam normal sensation to light touch and proprioception in bilateral upper and lower extremities Expressive aphasia ongoing, uses head nods primarily for me, tries to vocalize Impaired attention to right side, improving Impaired safety awareness Musc: right knee/shoulder discomfort with ROM, palpation Ambulating with RW CG GU: on commode having BM, stools are loose     Assessment/Plan: 1. Functional deficits which require 3+ hours per day of interdisciplinary therapy in a comprehensive inpatient rehab setting. Physiatrist is providing close team supervision and 24 hour management of active medical problems listed  below. Physiatrist and rehab team continue to assess barriers to discharge/monitor patient progress toward functional and medical goals  Care Tool:  Bathing    Body parts bathed by patient: Right arm, Left arm, Chest, Abdomen, Front perineal area, Buttocks, Right upper leg, Left upper leg, Right lower leg, Left lower leg, Face   Body parts bathed by helper: Left arm, Front perineal area, Buttocks, Right lower leg, Left lower leg     Bathing assist Assist Level: Contact Guard/Touching assist     Upper Body Dressing/Undressing Upper body dressing   What is the patient wearing?: Pull over shirt    Upper body assist Assist Level: Supervision/Verbal cueing    Lower Body Dressing/Undressing Lower body dressing      What is the patient wearing?: Incontinence brief, Pants     Lower body assist Assist for lower body dressing: Minimal Assistance - Patient > 75%     Toileting Toileting    Toileting assist Assist for toileting: Moderate Assistance - Patient 50 - 74%     Transfers Chair/bed transfer  Transfers assist     Chair/bed transfer assist level: Contact Guard/Touching assist     Locomotion Ambulation   Ambulation assist      Assist level: Contact Guard/Touching assist Assistive device: Walker-rolling Max distance: 150   Walk 10 feet activity  Assist     Assist level: Contact Guard/Touching assist Assistive device: Walker-rolling   Walk 50 feet activity   Assist Walk 50 feet with 2 turns activity did not occur: Safety/medical concerns  Assist level: Contact Guard/Touching assist Assistive device: Walker-rolling    Walk 150 feet activity   Assist Walk 150 feet activity did not occur: Safety/medical concerns  Assist level: Contact Guard/Touching assist Assistive device: Walker-rolling    Walk 10 feet on uneven surface  activity   Assist Walk 10 feet on uneven surfaces activity did not occur: Safety/medical concerns   Assist level:  Minimal Assistance - Patient > 75% Assistive device: Walker-rolling, Other (comment) (R hand splint)   Wheelchair     Assist Is the patient using a wheelchair?: Yes Type of Wheelchair: Manual    Wheelchair assist level: Minimal Assistance - Patient > 75% Max wheelchair distance: 500 feet or greater    Wheelchair 50 feet with 2 turns activity    Assist        Assist Level: Minimal Assistance - Patient > 75%   Wheelchair 150 feet activity     Assist      Assist Level: Minimal Assistance - Patient > 75%   Blood pressure (!) 133/95, pulse 95, temperature 98 F (36.7 C), resp. rate 17, height '5\' 7"'$  (1.702 m), weight 74.5 kg, SpO2 97 %.  Medical Problem List and Plan: 1. 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   Currently ModA toileting    [place] SLP goals, currently supervision for water protocol 2.  Impaired mobility, ambulating 270 feet: d/c lovenox. Continue Aspirin 81 mg daily and Plavix 75 mg daily to continue indefinitely 3. Shoulder arthritis: voltaren gel ordered, changed to prn. Discussed with patient. Discussed with therapy that I do not usually see spikes in blood pressure with topical voltaren gel, we can try again today and assess for these changes in vitals. Tylenol as needed. Provided pain relief journal  7/31- denied need for pain meds this AM- con't regimen 4. Mood/Behavior/Sleep: Provide emotional support             -antipsychotic agents: N/A 5. Neuropsych/cognition: This patient is not capable of making decisions on his own behalf. 6. Skin/Wound Care: Routine skin checks 7. Fluids/Electrolytes/Nutrition: Routine in and outs with follow-up chemistries 8.  Hypertension.  Continue Norvasc 5 mg daily.  Monitor with  increased mobility  7/31- BP DBP a little high; SBP OK- con't regimen 9.  Diabetes mellitus.  Hemoglobin A1c 7.1. d/c ISS. Patient maintained on Increase metformin to 1,000 mg BID  Decrease CBG checks to AC BID  Discussed minimizing foods with added sugar  CBG (last 3)  Recent Labs    10/22/21 0555 10/22/21 1645 10/23/21 0607  GLUCAP 144* 121* 140*     7/31- BG's well controlled- con't regimen 10.  Hyperlipidemia.  Intolerant to statin. Continue Repatha 140 mg subcutaneous every 2 weeks prior to admission. 11.  History of prostate cancer/prostatectomy 2024.  Follow-up outpatient 12. Hypoalbuminemia: recommended choosing foods high in protein and low in added sugar.  13. Constipation: increase magnesium gluconate to '500mg'$  HS. Provide dietary education. Resolved with sorbitol. D/c senna-docusate  7/31- LBM yesterday- going well 14. Overweight BMI 25.72: provide dietary education. Provided daughter in law with list of foods that can assist with weight loss.  15. Expressive aphasia: continue SLP 16. 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 Urinary retention: continue flomax HS 74. Fall yesterday: advised that flomax can cause falls by decreasing blood pressure, discussed this is why we give Flomax at night 20. Impaired problem solving and motor planning: continue SLP and OT 21. Tachycardia: Increase magnesium gluconate to '500mg'$  HS    LOS: 14 days A FACE TO FACE EVALUATION WAS PERFORMED  Gerald Bauer 10/23/2021, 9:40 AM

## 2021-10-23 NOTE — Progress Notes (Addendum)
Speech Language Pathology Daily Session Note  Patient Details  Name: Gerald Bauer MRN: [id number] Date of Birth: 1945-04-14  Today's Date: 10/23/2021 SLP Individual Time: 8099-8338 SLP Individual Time Calculation (min): 45 min  Short Term Goals: Week 2: SLP Short Term Goal 1 (Week 2): Patient will consume current diet textures and thin liquids with minimal s/sx concerning for aspiration and will participate in Dysphagia 3 trials to continue to progress solid textures. - Did not formally address this date.   SLP Short Term Goal 2 (Week 2): Patient will express wants/needs via multimodal communication (gesture, pictures, yes/no responses) with min A verbal/visual cues. - Pt expressed wants/needs via multimodal communication methods (gestures and yes/no responses) with Sup A question prompts.  SLP Short Term Goal 3 (Week 2): Pt will verbally name common pictures or objects on 60% of occasions given Max A multimodal cues. - Pt verbally named common descriptors (e.g. dirty, up, short, etc.) with 60% accuracy given Mod-Max A multimodal cues to include sentence completion prompts, metronome, and verbal + tactile cues for articulatory placement.  SLP Short Term Goal 4 (Week 2): Pt will demonstrate reading comprehension at the sentence and paragraph level as evident by answering yes/no questions pertaining reading material with 80% accuracy and Min A. - Pt demonstrated comprehension at the short phrase and sentence level on ~50% of opportunities given Sup A; ~90% given Mod-Max A (verbal and written choice prompts).   SLP Short Term Goal 5 (Week 2): Pt will correctly write 5 single funcitonal words on last step of CART protocol. - Did not formally follow CART protocol. Copied words with ~90% accuracy given Mod A verbal and visual cues.   SLP Short Term Goal 6 (Week 2): Pt will self-correct 100% of errors during language tasks with Sup A. - Pt self-corrected 100% of errors during language tasks with Min  A.   Skilled Therapeutic Interventions: S: Pt seen this date for skilled ST intervention targeting language goals outlined above. Pt received awake/alert and sitting upright in bed, finishing breakfast. Stated "hey" when SLP arrived and waved at patient. No c/o pain. Agreeable to ST intervention at bedside.  O: Please see above for objective data re: pt's performance on targeted goals. Pt completed various therapeutic tasks targeting reading comprehension at the short phrase and sentence level (fill in the phrase or sentence with the most appropriate word when given 3 written choice prompts), naming opposites when given in sentence completion format (e.g. picture of up and then picture of down). Demonstrated copying words given overall Mod A verbal and visual cues; benefited from naming letters aloud as he wrote them, to aid in motor planning. Continues to communicate mostly through yes/no responses and hand + head gestures. Minimal verbalizations outside of a structured and highly assisted language task. When verbalizing during these highly structured tasks, he benefits from mass practice + Mod-Max A multimodal cues and verbal cues to increase vocal intensity in the setting of verbal apraxia and dysarthria.  A: Pt remains stimulable for skilled ST interventions to include tactile + verbal articulatory cues, verbal/model prompts, use of metronome, written cues, supportive communication interventions, and sentence completion prompts. Did not formally address deglutition goals this session; will plan to next date. Per chart review, VSS and "pulmonary: CTA B/L; no W/R/R- good air movement," per MD note from today.  P: Pt left in bed with direct hand off to NT for toileting needs (when asked if pt needed to use the bathroom, he replied "yes.") Continue per  current ST POC next session.   Pain None/Denies  Therapy/Group: Individual Therapy  Saina Waage A Sian Joles 10/23/2021, 12:51 PM

## 2021-10-23 NOTE — Progress Notes (Signed)
Occupational Therapy Session Note  Patient Details  Name: Gerald Bauer MRN: [id number] Date of Birth: Jul 25, 1945  Today's Date: 10/23/2021 OT Individual Time: 1415-1445 Total Time: 30 min     Missed Time: 45 minutes (OT error on scheduled time start)  Short Term Goals: Week 2:  OT Short Term Goal 1 (Week 2): STG = LTG 2/2 ELOS  Skilled Therapeutic Interventions/Progress Updates:    Patient agreeable to participate in OT session. No report of pain during session.  Patient participated in skilled OT session focusing on NM re-ed to [place] while seated and standing. Therapist facilitated active right shoulder flexion, elbow flexion, wrist extension, and finger extension while attempted to grasp Squigz from OT using right hand and placing on vertical and horizontal surface in order to improve shoulder and scapular stability and functional use of RUE during ADL tasks. Pt was able to place 3 squigz on vertical surface and 4 on horizontal surface with increased time, VC for form and technique. Vertical surface squigz were placed while seated and horizontal squigz were placed while standing. AA/ROM exercises were completed while seated using tray table. Pt performed tables slides with right shoulder flexion, horizontal abduction/adduction, and circles right/left; 10X each. Therapist provided light tactile cues to keep an upright posture and activate only RUE shoulder and scapular muscles. [facility] task completed using 7 small foam shapes focusing on wrist extension and finger extension. Therapist educated patient on resting forearm on table while completing task in order to eliminate any compensatory movement patterns and decrease level of difficulty. Pt was able to transfer 7 foam shapes 1 at a time from right side of tray table to the left. VC were provided along with visual aids as needed for form and technique.     Therapy Documentation Precautions:  Precautions Precautions: Fall, Other  (comment) Precaution Comments: R hemi, R inattention, aphasia, dysphagia Restrictions Weight Bearing Restrictions: No   Therapy/Group: Individual Therapy  Ailene Ravel, OTR/L,CBIS  Supplemental OT - [facility]  10/23/2021, 3:11 PM

## 2021-10-23 NOTE — Progress Notes (Signed)
Physical Therapy Session Note  Patient Details  Name: Gerald Bauer MRN: [id number] Date of Birth: 12/25/1945  Today's Date: 10/23/2021 PT Individual Time: 1445-1540 PT Individual Time Calculation (min): 55 min   Short Term Goals: Week 2:  PT Short Term Goal 1 (Week 2): Pt will ambulate 128f with LRAD and CGA PT Short Term Goal 2 (Week 2): Pt will perform simulated car transfer with LRAD and CGA PT Short Term Goal 3 (Week 2): pt will perform bed mobility with supervision from flat bed without bedrails  Skilled Therapeutic Interventions/Progress Updates:     Patient in received from OT in patient's room upon PT arrival. Patient alert and agreeable to PT session. Patient denied pain during session.  Patient initially non-verbal, using pointing and head nods for communication throughout session. With encouragement and education on neuro recovery, patient progressed to simple 1-2 word responses including his full name, "good," "fine," "bed" when offered bed or chair to sit in, "up," and unable to state his DIL's name initially, but able to say her name started with "M" with mod-max cuing, then able to say her name when returning to the room.   Focused session on gait and balance training with functional mobility for improved motor control and visual scanning to the R.  Therapeutic Activity: Bed Mobility: Patient performed sit to supine with supervision and increased time in a flat bed without use of bed rails.  Transfers: Patient performed sit to/from stand with supervision throughout session with RW with good sequencing of use of R hand splint and without RW. Provided verbal cues for forward weight shift without AD and reaching back to control descent.  Gait Training:  Patient ambulated >150 feet using a RW with CGA without LOB, noted slow speed on turns with intentional foot placement today (good carryover from yesterday's PT note). He ambulated 200 feet, >300 feet x2, and >150 feet  without an AD with CGA for safety with LOB due to R toe catching x4 with patient able to self correct without additional assist. Ambulated with decreased gait speed, decreased step length and height R>L, decreased arm swing on R, mild forward trunk lean, and downward head gaze. Provided verbal cues for erect posture, looking ahead, increased weight shift for improved foot clearance, increased hamstring activation in R swing, increased lateral hip activation in R stance, and increased B arm swing for improved gait speed.  Neuromuscular Re-ed: Patient performed the following lower extremity motor control and balance activities: -sit to/from stand x15 without upper extremity support, focused on shifting weight into his toes for forward weight shift and controlled descent for eccentric motor control -weaving between 8 cones placed 1.5 feet apart down and back x3, trial 1 his 4 cones on the R, trial 2 hit 2 cones, trial 3 hit 1 cone, provided cues and demonstration for exaggerating distance around cones to the R to reduce hitting cones, noted carry-over with 90 deg R turns in gait following -ambulating forwards/backwards and side-stepping R/L with sudden stops and turns over 30 feet each direction with CGA, no LOB -picked up 8 small cones from the floor with his L hand with CGA -cleaned cones in sitting, donned gloves with total A, grabbed cones with R hand progressing from mod A to supervision wiped x2 cones with R hand with increased time/effort then switched focus to gripping cones in R and wiping with L with patient dropping cones x3 and mod cues for pincer grip  Patient in bed with his DIL in  the room at end of session with breaks locked, bed alarm set, 4 rails up per patient request, and all needs within reach.   Therapy Documentation Precautions:  Precautions Precautions: Fall, Other (comment) Precaution Comments: R hemi, R inattention, aphasia, dysphagia Restrictions Weight Bearing Restrictions:  No    Therapy/Group: Individual Therapy  Gerald Bauer L Gerald Bauer PT, DPT, NCS, CBIS  10/23/2021, 4:50 PM

## 2021-10-23 NOTE — Progress Notes (Signed)
Physical Therapy Session Note  Patient Details  Name: Gerald Bauer MRN: [id number] Date of Birth: 1946-01-13  Today's Date: 10/23/2021 PT Individual Time: 0858-0959 PT Individual Time Calculation (min): 61 min   Short Term Goals: Week 1:  PT Short Term Goal 1 (Week 1): Pt will perform supine<>sit with mod assist of 1 PT Short Term Goal 1 - Progress (Week 1): Met PT Short Term Goal 2 (Week 1): Pt will perform sit<>stands with min assist of 1 PT Short Term Goal 2 - Progress (Week 1): Met PT Short Term Goal 3 (Week 1): Pt will perform bed<>chair transfers using LRAD with min assist of 1 PT Short Term Goal 3 - Progress (Week 1): Met PT Short Term Goal 4 (Week 1): Pt will ambulate at least 71ft using LRAD with min assist of 1 PT Short Term Goal 4 - Progress (Week 1): Met PT Short Term Goal 5 (Week 1): Pt will navigate 4 steps using HRs with mod assist of 1 PT Short Term Goal 5 - Progress (Week 1): Met  Skilled Therapeutic Interventions/Progress Updates:    Pt received in bathroom as hand off from NT. Pt completed hygiene with supervision and managed clothing with min A. Communicated with NT to document in flow sheet. ambulatory transfer and hand hygiene at sink with close Page. Pt transported to therapy gym for time management and energy conservation. Pt reports goals of gait, balance and stairs. Targeted goals with high intensity stair training. Pt navigated 6" stairs 4 x 12 with seated rest breaks. Pt ascends with RLE with CGA-supervision but requires min A for balance and placing RLE on stair while descending. Pt then ambulated back to room with CGA and consistent cueing to maintain RLE foot clearance which worsened with fatigue. Pt returned to room and was left with all needs in reach and alarm active.   Therapy Documentation Precautions:  Precautions Precautions: Fall, Other (comment) Precaution Comments: R hemi, R inattention, aphasia, dysphagia Restrictions Weight Bearing  Restrictions: No General:      Therapy/Group: Individual Therapy  Mickel Fuchs 10/23/2021, 12:47 PM

## 2021-10-24 DIAGNOSIS — I10 Essential (primary) hypertension: Secondary | ICD-10-CM

## 2021-10-24 LAB — GLUCOSE, CAPILLARY
Glucose-Capillary: 125 mg/dL — ABNORMAL HIGH (ref 70–99)
Glucose-Capillary: 152 mg/dL — ABNORMAL HIGH (ref 70–99)
Glucose-Capillary: 125 mg/dL — ABNORMAL HIGH (ref 70–99)

## 2021-10-24 MED ORDER — ORAL CARE MOUTH RINSE
15.0000 mL | OROMUCOSAL | Status: DC
  Administered 2021-10-24 – 2021-10-31 (×19): 15 mL via OROMUCOSAL

## 2021-10-24 MED ORDER — ORAL CARE MOUTH RINSE
15.0000 mL | OROMUCOSAL | Status: DC | PRN
Start: 2021-10-24 — End: 2021-10-31
  Administered 2021-10-28: 15 mL via OROMUCOSAL

## 2021-10-24 NOTE — Progress Notes (Signed)
Physical Therapy Weekly Progress Note  Patient Details  Name: Gerald Bauer MRN: [id number] Date of Birth: 11/14/1945  Beginning of progress report period: October 10, 2021 End of progress report period: October 24, 2021  Today's Date: 10/24/2021 PT Individual Time: 2706-2376 PT Individual Time Calculation (min): 70 min   Patient has met 3 of 3 short term goals. Pt demonstrates steady progress toward long term goals. Pt is currently able to preform bed mobility from flat bed without bedrails and supervision, transfers with RW and CGA/supervision, ambulate >119f with RW and CGA, and navigate 12 6in steps with bilateral handrails and min A. Pt continues to be limited by mild R hemiparesis with R inattention, mild impulsivity, decreased safety awareness and insight into deficits, decreased standing balance/coordination, aphasia, and generalized weakness. Pt will benefit from family education prior to discharge.   Patient continues to demonstrate the following deficits muscle weakness, decreased cardiorespiratoy endurance, impaired timing and sequencing, unbalanced muscle activation, decreased coordination, and decreased motor planning, decreased attention, decreased awareness, decreased problem solving, and decreased safety awareness, and decreased standing balance, decreased postural control, hemiplegia, and decreased balance strategies and therefore will continue to benefit from skilled PT intervention to increase functional independence with mobility.  Patient progressing toward long term goals..  Continue plan of care.  PT Short Term Goals Week 2:  PT Short Term Goal 1 (Week 2): Pt will ambulate 1066fwith LRAD and CGA PT Short Term Goal 1 - Progress (Week 2): Met PT Short Term Goal 2 (Week 2): Pt will perform simulated car transfer with LRAD and CGA PT Short Term Goal 2 - Progress (Week 2): Met PT Short Term Goal 3 (Week 2): pt will perform bed mobility with supervision from flat bed without  bedrails PT Short Term Goal 3 - Progress (Week 2): Met Week 3:  PT Short Term Goal 1 (Week 3): STG=LTG due to LOS  Skilled Therapeutic Interventions/Progress Updates:  Ambulation/gait training;Cognitive remediation/compensation;Discharge planning;DME/adaptive equipment instruction;Functional mobility training;Pain management;Psychosocial support;Splinting/orthotics;Therapeutic Activities;UE/LE Strength taining/ROM;Visual/perceptual remediation/compensation;Balance/vestibular training;Community reintegration;Disease management/prevention;Functional electrical stimulation;Neuromuscular re-education;Patient/family education;Skin care/wound management;Stair training;Therapeutic Exercise;UE/LE Coordination activities;Wheelchair propulsion/positioning   Today's Interventions: Received pt on toilet with RN attending to care - PT took over. Pt agreeable to PT treatment and denied any pain during session. Session with emphasis on functional mobility/transfers, generalized strengthening and endurance, dynamic standing balance, gait training, and NMR. Pt performed all transfers with RW and CGA/close supervision throughout session. Pt required min A for clothing management on R side and ambulated [id number] RW and CGA to WC. Doffed non-skid socks and donned regular socks and slip on shoes with supervision and increased time with emphasis on functional use of RUE. Pt then ambulated [id number] trials with RW and CGA to/from main therapy gym - cues to increase R step length and for R attention as pt running into doorframe when turning to R.   Worked on sit<>stands with LLE on 3in step 1x10 increasing to 6in step 2x10 with CGA and emphasis on R lateral weight shifting and quad strength. Transitioned to single leg toe taps to 6in step 3x10 bilaterally with emphasis on R hip flexor strength and weight shifting to R side with min HHA for balance. Transitioned to standing R heel taps to 6in step 3x10 with min HHA with emphasis  on DF activation to promote carry over with gait - pt with 1 large LOB due to catching toe, requiring mod A to correct. Pt transferred sit<>stand<>tall kneeling with CGA and worked on mini  squats on knees 3x12 with BUE support on k-bench with emphasis on hip extension, quad strength, and weight bearing through RUE/LE. Transitioned to lateral stepping on knees with red TB around thighs along length of k-bench 1x4 and 1x6 laps with min A to R and CGA to L with emphasis on hip abductor/extensor strengthening - increased difficulty going towards R side with intermittent collapsing. Pt able to transfer tall kneeling<>side sit<>short sitting EOM with CGA but required heavy min A for tall kneeling<>standing<>sitting EOM. Pt then performed standing mini squats 3x15 with red TB around knees with CGA with emphasis on glute strength and R lateral weight shifting - cues to avoid knee valgus. Returned to room and concluded session with pt sitting in WC, needs within reach, and seatbelt alarm on.   Therapy Documentation Precautions:  Precautions Precautions: Fall, Other (comment) Precaution Comments: R hemi, R inattention, aphasia, dysphagia Restrictions Weight Bearing Restrictions: No  Therapy/Group: Individual Therapy Alfonse Alpers PT, DPT  10/24/2021, 7:11 AM

## 2021-10-24 NOTE — Progress Notes (Signed)
Physical Therapy Session Note  Patient Details  Name: Gerald Bauer MRN: [id number] Date of Birth: 06/07/1945  Today's Date: 10/24/2021 PT Individual Time: 1530-1600 PT Individual Time Calculation (min): 30 min   Short Term Goals: Week 3:  PT Short Term Goal 1 (Week 3): STG=LTG due to LOS  Skilled Therapeutic Interventions/Progress Updates:    Patient supine and denies pain.  Performed supine to sit with S.  Donned shoes at EOB with S.  Sit to stand to RW with hand splint with S.  Ambulated to dayroom 100' with RW and S.  Performed standing taps to cones with HHA and min A cues for L to tap top of cone.  Patient stepping over cones with min to mod A HHA cues for R foot clearance and for L lateral weight shift.  Patient performed putting on putting green with putter and min A for balance cues for positioning near the green and for stance.  Patient ambulated through floor ladder with HHA min A and cues for stride length and foot clearance.  Ambulating with RW and close S with cues for stride length, foot clearance x 250' in dayroom and back to pt's room.  Returned to supine with S.  Call bell and needs in reach.   Therapy Documentation Precautions:  Precautions Precautions: Fall, Other (comment) Precaution Comments: R hemi, R inattention, aphasia, dysphagia Restrictions Weight Bearing Restrictions: No Pain: Pain Assessment Pain Score: 0-No pain   Therapy/Group: Individual Therapy  Reginia Naas 10/24/2021, 6:46 PM  Magda Kiel, PT Acute Rehabilitation Services Office:[phone] 10/24/2021

## 2021-10-24 NOTE — Progress Notes (Signed)
Speech Language Pathology Weekly Progress and Session Note  Patient Details  Name: Gerald Bauer MRN: [id number] Date of Birth: 1945-06-24  Beginning of progress report period: October 17, 2021 End of progress report period: October 24, 2021  Today's Date: 10/24/2021 SLP Individual Time: 4967-5916 SLP Individual Time Calculation (min): 45 min  Short Term Goals: Week 2: SLP Short Term Goal 1 (Week 2): Patient will consume current diet textures and thin liquids with minimal s/sx concerning for aspiration and will participate in Dysphagia 3 trials to continue to progress solid textures. SLP Short Term Goal 1 - Progress (Week 2): Partly met SLP Short Term Goal 2 (Week 2): Patient will express wants/needs via multimodal communication (gesture, pictures, yes/no responses) with min A verbal/visual cues SLP Short Term Goal 2 - Progress (Week 2): Met SLP Short Term Goal 3 (Week 2): Pt will verbally name common pictures or objects on 60% of occasions given Max A multimodal cues. SLP Short Term Goal 3 - Progress (Week 2): Met SLP Short Term Goal 4 (Week 2): Pt will demonstrate reading comprehension at the sentence and paragraph level as evident by answering yes/no questions pertaining reading material with 80% accuracy and Min A. SLP Short Term Goal 4 - Progress (Week 2): Partly met SLP Short Term Goal 5 (Week 2): Pt will correctly write 5 single funcitonal words on last step of CART protocol. SLP Short Term Goal 5 - Progress (Week 2): Not met SLP Short Term Goal 6 (Week 2): Pt will self-correct 100% of errors during language tasks with Sup A. SLP Short Term Goal 6 - Progress (Week 2): Not met    New Short Term Goals: Week 3: SLP Short Term Goal 1 (Week 3): STG's = LTG's due to ELOS (8/8)  Weekly Progress Updates: Pt demonstrates functional gains as evident by partly or fully meeting  4 out of 6 short-term goals this repoting period. Pt education ongoing re: CVA, ST POC, neuro plasticity/recovery for  speech and language tasks ("use it to improve it," "don't use it, lose it"), importance of attempting to verbalize as often as possible, supportive communication interventions, and aspiration precautions/safe swallowing strategies. Pt remains on Dysphagia 2 textures due to prolonged mastication and need for foods to be moistened. Tolerating upgrade of liquids from nectar-thick to thin without overt s/sx concerning for airway invasion and VSS. Family education has yet to be completed due to no family present during [place] sessions. Continue to recommend ST intervention during this admission and HH or OP ST f/u post-discharge. Additionally, will require 24/7 supervision and assistance for iADL completion at time of discharge.  Intensity: Minumum of 1-2 x/day, 30 to 90 minutes Frequency: 3 to 5 out of 7 days Duration/Length of Stay: ELOS 8/8 Treatment/Interventions: Multimodal communication approach;Speech/Language facilitation;Cueing hierarchy;Dysphagia/aspiration precaution training;Functional tasks;Patient/family education;Therapeutic Activities   Daily Session  Skilled Therapeutic Interventions:     S: Pt seen this date for skilled ST intervention targeting deglutition and language goals outlined above. Pt received lying supine in bed, sleeping. Aroused to voice. Slow to verbalize initially. Agreeable to ST intervention in hospital room. Transferred from bed<>w/c with Min A squat pivot.    O: SLP facilitated today's session by providing Min A faded to Sup A for adherence to aspiration precautions during AM meal of Dysphagia 2 textures and thin liquids; intermittent throat clear noted, which appeared to mitigate with use of small bites/sips + alternating bites/sips. Additionally, pt completed structured language tasks targeting reading comprehension at the sentence and paragraph level  and confrontational naming. Pt achieved 80% accuracy when asked to answer yes/no questions at sentence level with Sup A.  Accuracy decreased to 60% when answering yes/no questions at paragraph level; improved to 100% accuracy given Mod A verbal and visual cues. Verbalized single syllable words when presented with pictures of common objects on ~60% of trials given Min A; improved to ~75% of trials given Max A. Did not target writing this session. Pt verbalizing yes/no and some automatic words and phrases during greetings ("bye") and social politeness scenarios ("thank you") given Min A.  A: Pt remains stimulable for skilled ST interventions to include tactile + verbal articulatory cues, verbal/model prompts, use of metronome, written cues, supportive communication interventions, and sentence completion prompts. Continues to communicate most via non-verbal means (hand and head gestures, facial expressions, and yes/no responses), though is beginning to verbal some single words during automatic speech tasks given Min A verbal cues ("hey," "bye," "thank you"). Self-administered Dysphagia 2 with thin liquids with minimal s/sx concerning for airway invasion (subtle throat clear intermittently and cough x 1) given intermittent Sup A verbal cues for slow rate of consumption and alternating bites/sips. Per chart review, VSS and "Pulmonary: CTA B/L; no W/R/R- good air movement," per most recent MD note.  P: Pt left OOB in w/c with all safety measures activated. Call bell within reach and all immediate needs met. Continue per updated ST POC next session.   Pain Pain Assessment Pain Scale: 0-10 Pain Score: 0-No pain  Therapy/Group: Individual Therapy  Gerald Bauer A Gerald Bauer 10/24/2021, 12:46 PM

## 2021-10-24 NOTE — Progress Notes (Signed)
Occupational Therapy Session Note  Patient Details  Name: Gerald Bauer MRN: [id number] Date of Birth: [birth year]  Today's Date: 10/24/2021 OT Individual Time: 1050-1200 OT Individual Time Calculation (min): 70 min    Short Term Goals: Week 2:  OT Short Term Goal 1 (Week 2): STG = LTG 2/2 ELOS  Skilled Therapeutic Interventions/Progress Updates:    Patient received up in wheelchair fully dressed.  Patient indicates clothing is clean today, and he opted to work in the gym versus ADL.  Patient transported to gym.  Patient able to transfer from wheelchair to mat table with supervision.  Patient reports shoulder pain with attempts to raise right arm.  Patient with compensatory strategies of left lean and right shoulder elevation with arm movement.  Patient locks up shulder movement via over recruitment of shoulder elevators, and pectoralis  - able to cue patient to lift from trunk extension versus shoulder elevation - however more challenging to reduce excessive pects activation in sitting.   Worked in supine to allow improved shoulder range of motion with decreased effort - emphasis on reducing over activation around shoulder as this is leading to his pain.  Facilitation to reduce incorrect muscle activation to allow shoulder range beyond 90*.  Followed with sidelying to sidesitting to activate scapular stabilizers.  Then modified plantigrade to load BUE - patient needs encouragement for active elbow extension in this position. Followed with seated and standing guided movement using UE ranger. Patient with no report of pain with active shoulder movement at end of session.   Patient walked back to room with min assist, facilitation and cueing to maintain more consistent activation through RUE/RLE.  Patient assisted back to bed bed alarm set, call bell and personal items in reach.    Therapy Documentation Precautions:  Precautions Precautions: Fall, Other (comment) Precaution Comments: R hemi, R  inattention, aphasia, dysphagia Restrictions Weight Bearing Restrictions: No  Pain:  Reports no pain initially - but has pain in shoulder with flexion - unable to rate     Therapy/Group: Individual Therapy  Mariah Milling 10/24/2021, 3:15 PM

## 2021-10-24 NOTE — Progress Notes (Signed)
PROGRESS NOTE   Subjective/Complaints:  Denies Pain. Reports he had no trouble falling asleep but had poor sleep because he was woken up a few times at night.   LBM 7/31   ROS: limited by aphasia  Objective:   No results found.  No results for input(s): "WBC", "HGB", "HCT", "PLT" in the last 72 hours.  Recent Labs    10/23/21 0525  CREATININE 0.81       Intake/Output Summary (Last 24 hours) at 10/24/2021 0750 Last data filed at 10/23/2021 0910 Gross per 24 hour  Intake 240 ml  Output --  Net 240 ml         Physical Exam: Vital Signs Blood pressure 115/83, pulse 99, temperature 98.1 F (36.7 C), resp. rate 16, height '5\' 7"'$  (1.702 m), weight 74.5 kg, SpO2 96 %.    General: awake, alert, appropriate, supine in bed; NT in room; NAD HENT: conjugate gaze; oropharynx moist CV: regular /borderline tachycardic rate; no JVD Pulmonary: CTA B/L; no W/R/R- good air movement GI: soft, NT, ND, (+)BS Psychiatric: appropriate- pleasant Neurological: alert- aphasic- very few words spoken Skin: warm and dry Neurologic: Cranial nerves II through XII intact, motor strength is 5/5 in left deltoid, bicep, tricep, grip, hip flexor, knee extensors, ankle dorsiflexor and plantar flexor RUE 2- RLE 4/5 Sensory exam normal sensation to light touch and proprioception in bilateral upper and lower extremities Expressive aphasia ongoing, uses head nods primarily for me, tries to vocalize Impaired attention to right side, improving Impaired safety awareness Musc: right knee/shoulder discomfort with ROM, palpation Ambulating with RW CG     Assessment/Plan: 1. Functional deficits which require 3+ hours per day of interdisciplinary therapy in a comprehensive inpatient rehab setting. Physiatrist is providing close team supervision and 24 hour management of active medical problems listed below. Physiatrist and rehab team continue to  assess barriers to discharge/monitor patient progress toward functional and medical goals  Care Tool:  Bathing    Body parts bathed by patient: Right arm, Left arm, Chest, Abdomen, Front perineal area, Buttocks, Right upper leg, Left upper leg, Right lower leg, Left lower leg, Face   Body parts bathed by helper: Left arm, Front perineal area, Buttocks, Right lower leg, Left lower leg     Bathing assist Assist Level: Contact Guard/Touching assist     Upper Body Dressing/Undressing Upper body dressing   What is the patient wearing?: Pull over shirt    Upper body assist Assist Level: Supervision/Verbal cueing    Lower Body Dressing/Undressing Lower body dressing      What is the patient wearing?: Incontinence brief, Pants     Lower body assist Assist for lower body dressing: Minimal Assistance - Patient > 75%     Toileting Toileting    Toileting assist Assist for toileting: Moderate Assistance - Patient 50 - 74%     Transfers Chair/bed transfer  Transfers assist     Chair/bed transfer assist level: Contact Guard/Touching assist     Locomotion Ambulation   Ambulation assist      Assist level: Contact Guard/Touching assist Assistive device: Walker-rolling Max distance: 150   Walk 10 feet activity   Assist  Assist level: Contact Guard/Touching assist Assistive device: Walker-rolling   Walk 50 feet activity   Assist Walk 50 feet with 2 turns activity did not occur: Safety/medical concerns  Assist level: Contact Guard/Touching assist Assistive device: Walker-rolling    Walk 150 feet activity   Assist Walk 150 feet activity did not occur: Safety/medical concerns  Assist level: Contact Guard/Touching assist Assistive device: Walker-rolling    Walk 10 feet on uneven surface  activity   Assist Walk 10 feet on uneven surfaces activity did not occur: Safety/medical concerns   Assist level: Minimal Assistance - Patient > 75% Assistive  device: Walker-rolling, Other (comment) (R hand splint)   Wheelchair     Assist Is the patient using a wheelchair?: Yes Type of Wheelchair: Manual    Wheelchair assist level: Minimal Assistance - Patient > 75% Max wheelchair distance: 500 feet or greater    Wheelchair 50 feet with 2 turns activity    Assist        Assist Level: Minimal Assistance - Patient > 75%   Wheelchair 150 feet activity     Assist      Assist Level: Minimal Assistance - Patient > 75%   Blood pressure 115/83, pulse 99, temperature 98.1 F (36.7 C), resp. rate 16, height '5\' 7"'$  (1.702 m), weight 74.5 kg, SpO2 96 %.  Medical Problem List and Plan: 1. 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   Currently ModA toileting    [place] SLP goals, currently supervision for water protocol 2.  Impaired mobility, ambulating 270 feet: d/c lovenox. Continue Aspirin 81 mg daily and Plavix 75 mg daily to continue indefinitely 3. Shoulder arthritis: voltaren gel ordered, changed to prn. Discussed with patient. Discussed with therapy that I do not usually see spikes in blood pressure with topical voltaren gel, we can try again today and assess for these changes in vitals. Tylenol as needed. Provided pain relief journal  7/31- denied need for pain meds this AM- con't regimen 4. Mood/Behavior/Sleep: Provide emotional support             -antipsychotic agents: N/A 5. Neuropsych/cognition: This patient is not capable of making decisions on his own behalf. 6. Skin/Wound Care: Routine skin checks 7. Fluids/Electrolytes/Nutrition: Routine in and outs with follow-up chemistries 8.  Hypertension.  Continue Norvasc 5 mg daily.  Monitor with increased mobility  8/1 Well controlled overall,  continue current medications and monitor trend 9.  Diabetes mellitus.  Hemoglobin A1c 7.1. d/c ISS. Patient maintained on Increase metformin to 1,000 mg BID  Decrease CBG checks to AC BID  Discussed minimizing foods with added sugar  CBG (last 3)  Recent Labs    10/23/21 1155 10/23/21 1657 10/24/21 0615  GLUCAP 161* 134* 125*      8/1 CBGs well controlled overall, continue current regimen 10.  Hyperlipidemia.  Intolerant to statin. Continue Repatha 140 mg subcutaneous every 2 weeks prior to admission. 11.  History of prostate cancer/prostatectomy 2024.  Follow-up outpatient 12. Hypoalbuminemia: recommended choosing foods high in protein and low in added sugar.  13. Constipation: increase magnesium gluconate to '500mg'$  HS. Provide dietary education. Resolved with sorbitol. D/c senna-docusate  8/1 LBM  yesterday, denies constipation 14. Overweight BMI 25.72: provide dietary education. Provided daughter in law with list of foods that can assist with weight loss.  15. Expressive aphasia: continue SLP 16. 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 Urinary retention: continue flomax HS 48. Fall yesterday: advised that flomax can cause falls by decreasing blood pressure, discussed this is why we give Flomax at night 20. Impaired problem solving and motor planning: continue SLP and OT 21. Tachycardia: Increase magnesium gluconate to '500mg'$  HS    LOS: 15 days A FACE TO FACE EVALUATION WAS PERFORMED  Jennye Boroughs 10/24/2021, 7:50 AM

## 2021-10-25 DIAGNOSIS — G4700 Insomnia, unspecified: Secondary | ICD-10-CM

## 2021-10-25 LAB — GLUCOSE, CAPILLARY
Glucose-Capillary: 137 mg/dL — ABNORMAL HIGH (ref 70–99)
Glucose-Capillary: 136 mg/dL — ABNORMAL HIGH (ref 70–99)

## 2021-10-25 NOTE — Progress Notes (Signed)
**   Late Entry **    10/16/21 2200  What Happened  Was fall witnessed? Yes  Who witnessed fall? Gerrit Halls, NT  Patients activity before fall bathroom-assisted  Point of contact buttocks  Was patient injured? No  Follow Up  MD notified Linna Hoff, [place]  Time MD notified 2135  Family notified Yes - comment (Spoke with son, Kasper Mudrick)  Time family notified 2140  Additional tests Yes-comment (Vitals)  Progress note created (see row info) Yes  Adult Fall Risk Assessment  Risk Factor Category (scoring not indicated) Fall has occurred during this admission (document High fall risk)  Patient Fall Risk Level High fall risk  Adult Fall Risk Interventions  Required Bundle Interventions *See Row Information* High fall risk - low, moderate, and high requirements implemented  Additional Interventions Use of appropriate toileting equipment (bedpan, BSC, etc.)  Screening for Fall Injury Risk (To be completed on HIGH fall risk patients) - Assessing Need for Floor Mats  Risk For Fall Injury- Criteria for Floor Mats Bleeding risk-anticoagulation (not prophylaxis)  Will Implement Floor Mats Yes  Pain Assessment  Pain Scale 0-10  Pain Score 0  Faces Pain Scale 0  PCA/Epidural/Spinal Assessment  Respiratory Pattern Regular;Unlabored  Neurological  Neuro (WDL) X  Level of Consciousness Alert  Orientation Level Oriented to person;Oriented to place  Cognition Follows commands  Speech Expressive aphasia  RUE Motor Response Non-purposeful movement  LUE Motor Response Purposeful movement  RLE Motor Response Purposeful movement  LLE Motor Response Purposeful movement  Musculoskeletal  Musculoskeletal (WDL) X  Assistive Device Wheelchair  Generalized Weakness Yes  Weight Bearing Restrictions No  Integumentary  Integumentary (WDL) WDL  Skin Color Appropriate for ethnicity  Skin Integrity Intact

## 2021-10-25 NOTE — Progress Notes (Signed)
Physical Therapy Session Note  Patient Details  Name: Gerald Bauer MRN: [id number] Date of Birth: 04-29-1945  Today's Date: 10/25/2021 PT Individual Time: 1329-1411 PT Individual Time Calculation (min): 42 min   Short Term Goals: Week 1:  PT Short Term Goal 1 (Week 1): Pt will perform supine<>sit with mod assist of 1 PT Short Term Goal 1 - Progress (Week 1): Met PT Short Term Goal 2 (Week 1): Pt will perform sit<>stands with min assist of 1 PT Short Term Goal 2 - Progress (Week 1): Met PT Short Term Goal 3 (Week 1): Pt will perform bed<>chair transfers using LRAD with min assist of 1 PT Short Term Goal 3 - Progress (Week 1): Met PT Short Term Goal 4 (Week 1): Pt will ambulate at least 66f using LRAD with min assist of 1 PT Short Term Goal 4 - Progress (Week 1): Met PT Short Term Goal 5 (Week 1): Pt will navigate 4 steps using HRs with mod assist of 1 PT Short Term Goal 5 - Progress (Week 1): Met Week 2:  PT Short Term Goal 1 (Week 2): Pt will ambulate 1028fwith LRAD and CGA PT Short Term Goal 1 - Progress (Week 2): Met PT Short Term Goal 2 (Week 2): Pt will perform simulated car transfer with LRAD and CGA PT Short Term Goal 2 - Progress (Week 2): Met PT Short Term Goal 3 (Week 2): pt will perform bed mobility with supervision from flat bed without bedrails PT Short Term Goal 3 - Progress (Week 2): Met Week 3:  PT Short Term Goal 1 (Week 3): STG=LTG due to LOS  Skilled Therapeutic Interventions/Progress Updates:      Therapy Documentation Precautions:  Precautions Precautions: Fall, Other (comment) Precaution Comments: R hemi, R inattention, aphasia, dysphagia Restrictions Weight Bearing Restrictions: No   Pt received seated in w/c at bedside and declines pain. Pt requires supervision with sit to stand, stand pivot and ambulation of 200 ft to main gym with RW and right hand splint. Pt performed Berg balance assessment. Patient demonstrates increased fall risk as noted by  score of   44/56 on Berg Balance Scale.  (<36= high risk for falls, close to 100%; 37-45 significant >80%; 46-51 moderate >50%; 52-55 lower >25%).  Pt demonstrates improved ability to perform transfers without UE support throughout session. Pt also performed timed up and go assessment without an AD and on average required 15.5 seconds to perform task. Pt is at increased risk of falls as he required greater than 12 seconds to complete assessment. Pt ambulated additional 200 ft without an AD and supervision to hospital room. Pt left seated in w/c at bedside with chair alarm on and all needs within reach.      Therapy/Group: Individual Therapy  SiVerl DickeriVerl DickerT, DPT  10/25/2021, 7:52 AM

## 2021-10-25 NOTE — Progress Notes (Signed)
Slept very well this shift. Denies pain or SOB. Assisted to bathroom as needed. Safety maintained.

## 2021-10-25 NOTE — Progress Notes (Signed)
Patient ID: Gerald Bauer, male   DOB: 04-03-1945, 76 y.o.   MRN: [id number] Team Conference Report to Patient/Family  Team Conference discussion was reviewed with the patient and caregiver, including goals, any changes in plan of care and target discharge date.  Patient and caregiver express understanding and are in agreement.  The patient has a target discharge date of 10/31/21.   Sw spoke with patient son to schedule family education. Patient son will be present on Friday 9-12 to complete family education. Patient son believes that they currently have a RW at home. Patient son will check for RW and follow up with SW. Son prefers OP therapies in [place]. No additional questions or concerns.   Dyanne Iha 10/25/2021, 1:03 PM

## 2021-10-25 NOTE — Progress Notes (Signed)
Occupational Therapy Session Note  Patient Details  Name: Gerald Bauer MRN: [id number] Date of Birth: Apr 06, 1945  Today's Date: 10/25/2021 OT Individual Time: 1430-1516 OT Individual Time Calculation (min): 46 min    Short Term Goals: Week 2:  OT Short Term Goal 1 (Week 2): STG = LTG 2/2 ELOS  Skilled Therapeutic Interventions/Progress Updates:  Pt awake up in w/c upon OT arrival to the room. Pt reports shakes head yes for OT session. When OT asks if pt would like HOB elevated pt reports, "Please."  Therapy Documentation Precautions:  Precautions Precautions: Fall, Other (comment) Precaution Comments: R hemi, R inattention, aphasia, dysphagia Restrictions Weight Bearing Restrictions: No Vital Signs: Please see "Flowsheet" for most recent vitals charted by nursing staff.  Pain: Pain Assessment Pain Scale: 0-10 Pain Score: 0-No pain  ADL: Grooming: Contact guard (Pt able to complete hand hygiene with CGA for standing balance at the sink.) Where Assessed-Grooming: Standing at sink Toileting: Minimal assistance (Pt able to void bladder on the toilet. Pt able to perform clothing management with minimal assist to manage on R side with CGA for standing balance.) Where Assessed-Toileting: Glass blower/designer: Minimal assistance (Pt able to complete ambulatory transfer from w/c > toilet with 1 moderate LOB when initiating ambulation from w/c requiring moderate assist to correct balance. After this recovery, pt able to ambulate to toilet with CGA for safety and no AD.) Toilet Transfer Method: Ambulating Toilet Transfer Equipment: Extra wide bedside commode ADL Comments: Pt able to complete ambulatory transfer from w/c > toilet > sink > EOB with minimal assistance and moderate assist with 1 LOB. See "toilet transfer" for further details. Pt able to perform sit > supine transfer with supervision for safety.  Therapeutic Activity: Pt participates in therapeutic activity in order to  improve RUE motor control and standing balance skills which are needed to improve independence with ADL's and functional mobility. Pt able to maintain static standing balance at the elevated table for approx 10 minutes with CGA for safety while participating in a functional reaching/grasping and midline crossing task. Pt requires minimal assistance to fully grasp rolled socks with R hand to secure a (weak) grasp, cross midline, and place in a container on L side. Pt requires moderate VC's to "squeeze", "lift arm", and "open fingers" to release in a container. Pt demo's increased difficulty with shoulder flexion needed to elevate object off the table and lift to place in the container. Pt able to complete 6 reps x 4 trials to grasp, cross midline from R <> L, and place in a container. Pt provided with white theraputty to improve fine motor control and motor coordination in R hand. Pt able to lightly squeeze putty, WB through R palm to to roll putty out, and then [place] a pincer grasp to pinch down the putty lightly. Provided pt with putty in the room to improve fine motor control. OT also left rolled up socks and a container in the room for pt to practice grasping, crossing midline, and placing in a container.   Functional Mobility: Pt able to complete various sit <> stand transfers form w/c during therapeutic activity with CGA. Pt able to complete various ambulatory transfers in room from w/c > toilet > sink > EOB with CGA - minimal assistance and 1 instance of moderate assistance to correct a LOB. Pt able to perform sit > supine transfer with close supervision for safety.   Pt returned to bed at end of session. Pt left resting comfortably in bed  with personal belongings and call light within reach, bed alarm on and activated, bed in low position, 3 bed rails up, and comfort needs attended to.   Therapy/Group: Individual Therapy  [name] 10/25/2021, 4:19 PM

## 2021-10-25 NOTE — Progress Notes (Addendum)
Speech Language Pathology Daily Session Note  Patient Details  Name: Gerald Bauer MRN: [id number] Date of Birth: 02-09-1946  Today's Date: 10/25/2021 SLP Individual Time: 0733-0803 SLP Individual Time Calculation (min): 30 min  Short Term Goals: Week 3: SLP Short Term Goal 1 (Week 3): STG's = LTG's due to ELOS (8/8)  Skilled Therapeutic Interventions: S: Pt seen this date for skilled ST intervention targeting deglutition and language goals outlined in care plan. Pt received awake/alert and lying supine in bed. 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 Agreeable to ST intervention in hospital room. To prepare for AM meal, pt transferred from bed <> w/c stand pivot with CGA.  O: Today's session focused on diet tolerance, trials of D3 textures with thin liquids, and functional communication. SLP facilitated today's session by providing Sup A, during meal consumption, to self-administer one bite/sip at a time, slow rate, and alternating bites/sips. Benefited from supportive communication interventions to include asking yes/no questions, providing written choice prompts, and interpretation of hand + head gestures, facial expressions, and verbalizations attempts produced by pt. Pt attempted to verbalize name of functional objects and foods during AM meal on ~30% of opportunities with Mod A verbal cues to include sentence completion and model prompting. With Mod A verbal and visual cues for implementation of skilled + supportive communication interventions, pt communicated, via non-verbal means, that he enjoys watching the weather channel, wanted to know the weather, would like to go outside, and that he was not ready to change his clothes for the day, and that he continues to not sleep well at night; MD notified of sleep disturbances ("do not disturb" ordered).  A: Pt remains stimulable for skilled ST interventions to include  tactile + verbal articulatory cues, verbal/model prompts, use of metronome, written cues, supportive communication interventions, and sentence completion prompts. Continues to communicate via non-verbal means (hand and head gestures, facial expressions, and yes/no responses), though is beginning to verbal some single words, that are automatic in nature, given Min A verbal cues ("hey," "bye," "thank you") for initiation and increased vocal intensity. Self-administered Dysphagia 3 textures with thin liquids with minimal s/sx concerning for airway invasion (subtle throat clear intermittently; vocal quality clear b/w bites) given intermittent Sup A verbal cues for slow rate of consumption and alternating bites/sips. Per chart review, vital signs remain stable with no indication concerning for infectious process.   P: Pt left OOB in w/c all safety measures activated. Call bell within reach and all immediate needs met. Continue per current ST POC next session.  Pain None/Denies  Therapy/Group: Individual Therapy  Azelea Seguin A Kamarah Bilotta 10/25/2021, 1:02 PM

## 2021-10-25 NOTE — Progress Notes (Signed)
Physical Therapy Session Note  Patient Details  Name: Gerald Bauer MRN: [id number] Date of Birth: 09-Feb-1946  Today's Date: 10/25/2021 PT Individual Time: 0845-1000 PT Individual Time Calculation (min): 75 min   Short Term Goals: Week 2:  PT Short Term Goal 1 (Week 2): Pt will ambulate 14f with LRAD and CGA PT Short Term Goal 1 - Progress (Week 2): Met PT Short Term Goal 2 (Week 2): Pt will perform simulated car transfer with LRAD and CGA PT Short Term Goal 2 - Progress (Week 2): Met PT Short Term Goal 3 (Week 2): pt will perform bed mobility with supervision from flat bed without bedrails PT Short Term Goal 3 - Progress (Week 2): Met  Skilled Therapeutic Interventions/Progress Updates:    pt received in w/c and agreeable to therapy. No complaint of pain. Pt found to be donning shoes and socks from w/c level on arrival, completed mod I. Note on pt's whiteboard states he would like to go outside. Session focused on community reintegration and gait on varied surfaces. [facility] Pt ambulated with RW and CGA throughout session. Occ min A for toe catching and small LOB at times. Pt required verbal cues fading to tactile cues to attend to R foot to prevent dragging. Pt ambulated 2 x 250 ft over slightly unlevel concrete. Cues to attend to objects on R side. Better able to avoid bench and branches in path on second attempt when path was more familiar. Pt also navigated 2 x 100 ft over brick and stairs with landing with CGA and no rest breaks. Pt self selects using R rail and leading with RLE. Cued for alteral technique with increased safety noted. Pt then navigated sloping incline x 200 ft, steps x 4 in same manner as above, and grass. Required cues for slower speed in grass and RW management. Seated rest breaks as required d/t hot sun. [facility] Pt successfully navigated elevators and hospital environment with CGA. Noted most  difficulty distracting outdoor environment and changing surfaces. On return to room, discussed increasing attention to R side and being more cautious when in such environments. Pt remained in w/c at end of session and was left with all needs in reach and alarm active.   Therapy Documentation Precautions:  Precautions Precautions: Fall, Other (comment) Precaution Comments: R hemi, R inattention, aphasia, dysphagia Restrictions Weight Bearing Restrictions: No General:       Therapy/Group: Individual Therapy  OMickel Fuchs8/04/2021, 1:51 PM

## 2021-10-25 NOTE — Patient Care Conference (Signed)
Inpatient RehabilitationTeam Conference and Plan of Care Update Date: 10/25/2021   Time: 11:54 AM    Patient Name: Gerald Bauer      Medical Record Number: [id number]  Date of Birth: 12/30/1945 Sex: Male         Room/Bed: 4W21C/4W21C-01 Payor Info: Payor: [place] / Plan: [facility] MEDICARE / Product Type: *No Product type* /    Admit Date/Time:  10/09/2021  3:08 PM  Primary Diagnosis:  Left middle cerebral artery stroke [facility])  Hospital Problems: Principal Problem:   Left middle cerebral artery stroke [facility])    Expected Discharge Date: Expected Discharge Date: 10/31/21  Team Members Present: Physician leading conference: Dr. Jennye Boroughs Social Worker Present: Erlene Quan, BSW Nurse Present: Dorthula Nettles, RN PT Present: Becky Sax, PT OT Present: Other (comment) SLP Present: Helaine Chess, SLP PPS Coordinator present : Gunnar Fusi, SLP     Current Status/Progress Goal Weekly Team Focus  Bowel/Bladder   Mixed Incontinence, Needs stimulation. LBM 10/23/21  Regain continence  Assist with Toilet Q3h and prn   Swallow/Nutrition/ Hydration   D2 with thin liquids. Increased throat clearing with D3 + thin textures, though no change in vitals or medical status.  min A  Current diet tolerance and ongoing trials of D3 textures.   ADL's   Min assist bathing and LB dressing, Min assist to CGA toilet transfers, min assist toileting  supervision/CGA  RUE NMR, Reduce shoulder pain - improve mechanics for reach in RUE, Balance, safety   Mobility   bed mobility supervision, transfers with RW CGA/supervision, gait 141f with RW CGA, 12 steps 2 rails min A  CGA/supervision overall at ambulatory level  activity tolerance, R hemibody NMR, bed mobility, transfer, stair, gait training, pt/fam education, sitting and standing balance   Communication   Sup A comprehension with additional time. Sup A for communication via multimodal methods, overall Mod-Max A for  approximating verbalizations.  sup A comprehension, mod A communication through multimodal means, max A for verbal expression  vocalizations/approximations, multimodal communication, stimulability for low-tech communication supports, pt/family education   Safety/Cognition/ Behavioral Observations  not addressing to focus on language goals  N/A  N/A   Pain   Denies pain  Remain pain free  Assess Q shift and prn   Skin   No Impairment  Maintain integrity  Assess QS and prn     Discharge Planning:  Discharging home with son and SIL to assist. 24/7 supervision avaliable. Family education scheduled.   Team Discussion: No new concerns. HTN, DM controlled. Bowels moving. Continent B/B, no pain reported. Medically ready for discharge. Discharging home with son and SIL to assist. Beginning to say a couple of words. Apraxic. Medications with apple sauce. HH vs. Outpatient.  Patient on target to meet rehab goals: yes, CGA/supervision goals. Currently supervision bed mobility, CGA transfers, right inattention. 12 steps with 2 rails. Min assist overall, does well with cues. Dys 2 diet with Dys 3 trials. Supervision with auditory comprehension, language limited.   *See Care Plan and progress notes for long and short-term goals.   Revisions to Treatment Plan:  Finalizing discharge plans   Teaching Needs: Family education, medication management, bowel/bladder management, transfer/gait training, etc.   Current Barriers to Discharge: No new barriers  Possible Resolutions to Barriers: All barriers addressed     Medical Summary Current Status: L MCA infarction, HTN, DM, constipation, tachycardia,dysphagia  Barriers to Discharge: Home enviroment access/layout;Medical stability  Barriers to Discharge Comments: L MCA infarction, HTN, DM, constipation,  tachycardia,dysphagia Possible Resolutions to [employer]: modified diet, follow CBG, laxatives, monitor BP   Continued Need for Acute  Rehabilitation Level of Care: The patient requires daily medical management by a physician with specialized training in physical medicine and rehabilitation for the following reasons: Direction of a multidisciplinary physical rehabilitation program to maximize functional independence : Yes Medical management of patient stability for increased activity during participation in an intensive rehabilitation regime.: Yes Analysis of laboratory values and/or radiology reports with any subsequent need for medication adjustment and/or medical intervention. : Yes   I attest that I was present, lead the team conference, and concur with the assessment and plan of the team.   Cristi Loron 10/25/2021, 8:28 PM

## 2021-10-25 NOTE — Progress Notes (Signed)
PROGRESS NOTE   Subjective/Complaints:  No new complaints this AM. Continues to have poor sleep due to people waking him at night.   LBM 7/31   ROS: limited by aphasia  Objective:   No results found.  No results for input(s): "WBC", "HGB", "HCT", "PLT" in the last 72 hours.  Recent Labs    10/23/21 0525  CREATININE 0.81       Intake/Output Summary (Last 24 hours) at 10/25/2021 1155 Last data filed at 10/25/2021 4174 Gross per 24 hour  Intake 420 ml  Output 150 ml  Net 270 ml         Physical Exam: Vital Signs Blood pressure 126/89, pulse 95, temperature 97.8 F (36.6 C), resp. rate 17, height '5\' 7"'$  (1.702 m), weight 74.5 kg, SpO2 97 %.    General: awake, alert, appropriate, supine in bed;NAD HENT: [place], AT, conjugate gaze; oropharynx moist CV: regular /borderline tachycardic rate; no JVD Pulmonary: CTA B/L; no W/R/R- good air movement GI: soft, NT, ND, (+)BS Psychiatric: appropriate- pleasant Neurological: alert- aphasic- very few words spoken Skin: warm and dry Neurologic: Cranial nerves II through XII intact, motor strength is 5/5 in left deltoid, bicep, tricep, grip, hip flexor, knee extensors, ankle dorsiflexor and plantar flexor RUE 2- RLE 4/5 Sensory exam normal sensation to light touch and proprioception in bilateral upper and lower extremities Expressive aphasia ongoing, uses head nods primarily for me, tries to vocalize Impaired attention to right side, improving Impaired safety awareness Musc: right knee/shoulder discomfort with ROM, palpation Ambulating with RW CG     Assessment/Plan: 1. Functional deficits which require 3+ hours per day of interdisciplinary therapy in a comprehensive inpatient rehab setting. Physiatrist is providing close team supervision and 24 hour management of active medical problems listed below. Physiatrist and rehab team continue to assess barriers to  discharge/monitor patient progress toward functional and medical goals  Care Tool:  Bathing    Body parts bathed by patient: Right arm, Left arm, Chest, Abdomen, Front perineal area, Buttocks, Right upper leg, Left upper leg, Right lower leg, Left lower leg, Face   Body parts bathed by helper: Left arm, Front perineal area, Buttocks, Right lower leg, Left lower leg     Bathing assist Assist Level: Contact Guard/Touching assist     Upper Body Dressing/Undressing Upper body dressing   What is the patient wearing?: Pull over shirt    Upper body assist Assist Level: Supervision/Verbal cueing    Lower Body Dressing/Undressing Lower body dressing      What is the patient wearing?: Incontinence brief, Pants     Lower body assist Assist for lower body dressing: Minimal Assistance - Patient > 75%     Toileting Toileting    Toileting assist Assist for toileting: Moderate Assistance - Patient 50 - 74%     Transfers Chair/bed transfer  Transfers assist     Chair/bed transfer assist level: Contact Guard/Touching assist     Locomotion Ambulation   Ambulation assist      Assist level: Contact Guard/Touching assist Assistive device: Walker-rolling Max distance: 150   Walk 10 feet activity   Assist     Assist level: Contact Guard/Touching assist Assistive  device: Walker-rolling   Walk 50 feet activity   Assist Walk 50 feet with 2 turns activity did not occur: Safety/medical concerns  Assist level: Contact Guard/Touching assist Assistive device: Walker-rolling    Walk 150 feet activity   Assist Walk 150 feet activity did not occur: Safety/medical concerns  Assist level: Contact Guard/Touching assist Assistive device: Walker-rolling    Walk 10 feet on uneven surface  activity   Assist Walk 10 feet on uneven surfaces activity did not occur: Safety/medical concerns   Assist level: Minimal Assistance - Patient > 75% Assistive device:  Walker-rolling, Other (comment) (R hand splint)   Wheelchair     Assist Is the patient using a wheelchair?: Yes Type of Wheelchair: Manual    Wheelchair assist level: Minimal Assistance - Patient > 75% Max wheelchair distance: 500 feet or greater    Wheelchair 50 feet with 2 turns activity    Assist        Assist Level: Minimal Assistance - Patient > 75%   Wheelchair 150 feet activity     Assist      Assist Level: Minimal Assistance - Patient > 75%   Blood pressure 126/89, pulse 95, temperature 97.8 F (36.6 C), resp. rate 17, height '5\' 7"'$  (1.702 m), weight 74.5 kg, SpO2 97 %.  Medical Problem List and Plan: 1. 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    [place] SLP goals, currently supervision for water protocol  -Team conference today 2.  Impaired mobility, ambulating 270 feet: d/c lovenox. Continue Aspirin 81 mg daily and Plavix 75 mg daily to continue indefinitely 3. Shoulder arthritis: voltaren gel ordered, changed to prn. Discussed with patient. Discussed with therapy that I do not usually see spikes in blood pressure with topical voltaren gel, we can try again today and assess for these changes in vitals. Tylenol as needed. Provided pain relief journal  7/31- denied need for pain meds this AM- con't regimen 4. Mood/Behavior/Sleep: Provide emotional support             -antipsychotic agents: N/A 5. Neuropsych/cognition: This patient is not capable of making decisions on his own behalf. 6. Skin/Wound Care: Routine skin checks 7. Fluids/Electrolytes/Nutrition: Routine in and outs with follow-up chemistries 8.  Hypertension.  Continue Norvasc 5 mg daily.  Monitor with increased mobility  8/2 Well controlled, continue to follow 9.   Diabetes mellitus.  Hemoglobin A1c 7.1. d/c ISS. Patient maintained on Increase metformin to 1,000 mg BID  Decrease CBG checks to AC BID  Discussed minimizing foods with added sugar  CBG (last 3)  Recent Labs    10/24/21 1214 10/24/21 1718 10/25/21 0627  GLUCAP 152* 125* 137*      8/2 CBGs continue to be well controlled 10.  Hyperlipidemia.  Intolerant to statin. Continue Repatha 140 mg subcutaneous every 2 weeks prior to admission. 11.  History of prostate cancer/prostatectomy 2024.  Follow-up outpatient 12. Hypoalbuminemia: recommended choosing foods high in protein and low in added sugar.  13. Constipation: increase magnesium gluconate to '500mg'$  HS. Provide dietary education. Resolved with sorbitol. D/c senna-docusate  LBM 7/31, denies constipation, continue to monitor 14. Overweight BMI 25.72: provide dietary education.  Provided daughter in law with list of foods that can assist with weight loss.  15. Expressive aphasia: continue SLP 16. 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 Urinary retention: continue flomax HS 1. Fall yesterday: advised that flomax can cause falls by decreasing blood pressure, discussed this is why we give Flomax at night 20. Impaired problem solving and motor planning: continue SLP and OT 21. Tachycardia: Increase magnesium gluconate to '500mg'$  HS 22. Insomnia  -Will place order to minimize disturbances at night    LOS: 16 days A FACE TO FACE EVALUATION WAS PERFORMED  Jennye Boroughs 10/25/2021, 11:55 AM

## 2021-10-26 LAB — GLUCOSE, CAPILLARY
Glucose-Capillary: 135 mg/dL — ABNORMAL HIGH (ref 70–99)
Glucose-Capillary: 117 mg/dL — ABNORMAL HIGH (ref 70–99)

## 2021-10-26 NOTE — Progress Notes (Signed)
Physical Therapy Session Note  Patient Details  Name: Gerald Bauer MRN: [id number] Date of Birth: [birth year]  Today's Date: 10/26/2021 PT Individual Time: 0805-0859 PT Individual Time Calculation (min): 54 min   Short Term Goals: Week 3:  PT Short Term Goal 1 (Week 3): STG=LTG due to LOS  Skilled Therapeutic Interventions/Progress Updates:      Pt supine in bed to start - agreeable to therapy and reports mild L hip pain, mobility and distraction provided for pain management. Supine<>sitting EOB with supervision with HOB elevated. Pt removes hospital socks without assist while seated EOB via figure-4 technique. Don's tennis shoes without assist as well.   Sit<>stand to RW with supervision and assist for RUE in RW hand splint. Ambulated from his room to main rehab gym, ~166f, with supervision and RW - cues needed to monitor and visual scan his environment to avoid running into obstacles on R.   Dynamic gait training with no AD using agility ladder - forward stepping with single leg into each ladder and then side stepping. Pt unable to avoid stepping on ladder despite max cues, truly 0% accuracy to avoid stepping on ladder with his RLE. Several minor LOB that required light minA for correction. Continued training with ball toss to self while ambulating in straight path - CGA for safety.  4-square step testing with minA overall for balance without UE support - again, unable to avoid stepping on obstacles for this test in all directions (forward, backwards, sideways).   Standing ball toss to therapist with bias towards his R side to encourage visual scanning and RUE integration. X1 LOB to the R that required minA for correcting.  Ambulated back to his room ~1556fwith CGA and no AD to his room. X1 instance of R toe catching that resulting in minor LOB that was self corrected.   Concluded session seated in recliner, all needs met.  *[place]) music during session and  pt becoming emotional/tearful - emotional support provided and pt appreciative.     Therapy Documentation Precautions:  Precautions Precautions: Fall, Other (comment) Precaution Comments: R hemi, R inattention, aphasia, dysphagia Restrictions Weight Bearing Restrictions: No General:   Vital Signs: Therapy Vitals Temp: 97.7 F (36.5 C) Temp Source: Oral Pulse Rate: 85 Resp: 18 BP: 107/68 Patient Position (if appropriate): Lying Oxygen Therapy SpO2: 99 % O2 Device: Room Air Pain:   Mobility:   Locomotion :    Trunk/Postural Assessment :    Balance:   Exercises:   Other Treatments:      Therapy/Group: Individual Therapy  ChAlger Simons/05/2021, 8:53 AM

## 2021-10-26 NOTE — Progress Notes (Signed)
PROGRESS NOTE   Subjective/Complaints:  He is in bed this AM. No new concerns. He had a BM today.    ROS: limited by aphasia  Objective:   No results found.  No results for input(s): "WBC", "HGB", "HCT", "PLT" in the last 72 hours.  No results for input(s): "NA", "K", "CL", "CO2", "GLUCOSE", "BUN", "CREATININE", "CALCIUM" in the last 72 hours.     Intake/Output Summary (Last 24 hours) at 10/26/2021 1305 Last data filed at 10/26/2021 1256 Gross per 24 hour  Intake 827 ml  Output --  Net 827 ml         Physical Exam: Vital Signs Blood pressure 107/68, pulse 85, temperature 97.7 F (36.5 C), temperature source Oral, resp. rate 18, height '5\' 7"'$  (1.702 m), weight 74.5 kg, SpO2 99 %.    General: awake, alert, appropriate, supine in bed;NAD HENT: [place], AT, conjugate gaze; oropharynx moist CV: regular /borderline tachycardic rate; no JVD Pulmonary: CTA B/L; no W/R/R- good air movement GI: soft, NT, ND, (+)BS Psychiatric: appropriate- pleasant Neurological: alert- aphasic- very few words spoken Skin: warm and dry, no breakdown noted Neurologic: Cranial nerves II through XII intact, motor strength is 5/5 in left deltoid, bicep, tricep, grip, hip flexor, knee extensors, ankle dorsiflexor and plantar flexor RUE 2- RLE 4/5 Sensory exam normal sensation to light touch and proprioception in bilateral upper and lower extremities Expressive aphasia ongoing, uses head nods primarily for me, tries to vocalize Impaired attention to right side, improving Impaired safety awareness Musc: right knee/shoulder discomfort with ROM, palpation Ambulating with RW CG     Assessment/Plan: 1. Functional deficits which require 3+ hours per day of interdisciplinary therapy in a comprehensive inpatient rehab setting. Physiatrist is providing close team supervision and 24 hour management of active medical problems listed below. Physiatrist and  rehab team continue to assess barriers to discharge/monitor patient progress toward functional and medical goals  Care Tool:  Bathing    Body parts bathed by patient: Right arm, Left arm, Chest, Abdomen, Front perineal area, Buttocks, Right upper leg, Left upper leg, Right lower leg, Left lower leg, Face   Body parts bathed by helper: Left arm, Front perineal area, Buttocks, Right lower leg, Left lower leg     Bathing assist Assist Level: Contact Guard/Touching assist     Upper Body Dressing/Undressing Upper body dressing   What is the patient wearing?: Pull over shirt    Upper body assist Assist Level: Supervision/Verbal cueing    Lower Body Dressing/Undressing Lower body dressing      What is the patient wearing?: Incontinence brief, Pants     Lower body assist Assist for lower body dressing: Minimal Assistance - Patient > 75%     Toileting Toileting    Toileting assist Assist for toileting: Moderate Assistance - Patient 50 - 74%     Transfers Chair/bed transfer  Transfers assist     Chair/bed transfer assist level: Supervision/Verbal cueing     Locomotion Ambulation   Ambulation assist      Assist level: Supervision/Verbal cueing Assistive device: No Device Max distance: 200 feet   Walk 10 feet activity   Assist     Assist level:  Supervision/Verbal cueing Assistive device: No Device   Walk 50 feet activity   Assist Walk 50 feet with 2 turns activity did not occur: Safety/medical concerns  Assist level: Supervision/Verbal cueing Assistive device: No Device    Walk 150 feet activity   Assist Walk 150 feet activity did not occur: Safety/medical concerns  Assist level: Supervision/Verbal cueing Assistive device: No Device    Walk 10 feet on uneven surface  activity   Assist Walk 10 feet on uneven surfaces activity did not occur: Safety/medical concerns   Assist level: Minimal Assistance - Patient > 75% Assistive device:  Walker-rolling, Other (comment) (R hand splint)   Wheelchair     Assist Is the patient using a wheelchair?: Yes Type of Wheelchair: Manual    Wheelchair assist level: Minimal Assistance - Patient > 75% Max wheelchair distance: 500 feet or greater    Wheelchair 50 feet with 2 turns activity    Assist        Assist Level: Minimal Assistance - Patient > 75%   Wheelchair 150 feet activity     Assist      Assist Level: Minimal Assistance - Patient > 75%   Blood pressure 107/68, pulse 85, temperature 97.7 F (36.5 C), temperature source Oral, resp. rate 18, height '5\' 7"'$  (1.702 m), weight 74.5 kg, SpO2 99 %.  Medical Problem List and Plan: 1. 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  Impaired mobility, ambulating 270 feet: d/c lovenox. Continue Aspirin 81 mg daily and Plavix 75 mg daily to continue indefinitely 3. Shoulder arthritis: voltaren gel ordered, changed to prn. Discussed with patient. Discussed with therapy that I do not usually see spikes in blood pressure with topical voltaren gel, we can try again today and assess for these changes in vitals. Tylenol as needed. Provided pain relief journal  7/31- denied need for pain meds this AM- con't regimen 4. Mood/Behavior/Sleep: Provide emotional support             -antipsychotic agents: N/A 5. Neuropsych/cognition: This patient is not capable of making decisions on his own behalf. 6. Skin/Wound Care: Routine skin checks 7. Fluids/Electrolytes/Nutrition: Routine in and outs with follow-up chemistries 8.  Hypertension.  Continue Norvasc 5 mg daily.  Monitor with increased mobility  8/3 good control 9.  Diabetes mellitus.   Hemoglobin A1c 7.1. d/c ISS. Patient maintained on Increase metformin to 1,000 mg BID  Decrease CBG checks to AC BID  Discussed minimizing foods with added sugar  CBG (last 3)  Recent Labs    10/25/21 0627 10/25/21 1634 10/26/21 0620  GLUCAP 137* 136* 135*      8/3 good control, continue metformin 10.  Hyperlipidemia.  Intolerant to statin. Continue Repatha 140 mg subcutaneous every 2 weeks prior to admission. 11.  History of prostate cancer/prostatectomy 2024.  Follow-up outpatient 12. Hypoalbuminemia: recommended choosing foods high in protein and low in added sugar.  13. Constipation: increase magnesium gluconate to '500mg'$  HS. Provide dietary education. Resolved with sorbitol. D/c senna-docusate  LBM 8/3 improved 14. Overweight BMI 25.72: provide dietary education. Provided daughter in law with list  of foods that can assist with weight loss.  15. Expressive aphasia: continue SLP 16. 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 Urinary retention: continue flomax HS 10. Fall yesterday: advised that flomax can cause falls by decreasing blood pressure, discussed this is why we give Flomax at night 20. Impaired problem solving and motor planning: continue SLP and OT 21. Tachycardia: Increase magnesium gluconate to '500mg'$  HS 22. Insomnia  -Will place order to minimize disturbances at night    LOS: 17 days A FACE TO FACE EVALUATION WAS PERFORMED  Jennye Boroughs 10/26/2021, 1:05 PM

## 2021-10-26 NOTE — Progress Notes (Signed)
Occupational Therapy Session Note  Patient Details  Name: Gerald Bauer MRN: [id number] Date of Birth: 05/31/1945  Today's Date: 10/26/2021 OT Individual Time: 4315-4008 OT Individual Time Calculation (min): 40 min    Short Term Goals: Week 2:  OT Short Term Goal 1 (Week 2): STG = LTG 2/2 ELOS  Skilled Therapeutic Interventions/Progress Updates:  Pt up in w/c upon OT arrival to the room. Pt reports, "Shorts," after OT gives verbal options for clothing. Pt in agreement for OT session.  Therapy Documentation Precautions:  Precautions Precautions: Fall, Other (comment) Precaution Comments: R hemi, R inattention, aphasia, dysphagia Restrictions Weight Bearing Restrictions: No Vital Signs: Please see "Flowsheet" for most recent vitals charted by nursing staff.  Pain: Pain Assessment Pain Scale: 0-10 Pain Score: 0-No pain  ADL: Grooming: Setup (Pt able to comb hair with set-up assist while seated in the w/c.) Where Assessed-Grooming: Wheelchair Upper Body Bathing: Supervision/safety (Pt able to bathe all UB parts with close supervision for safety while sitting onthe tub-bench.) Where Assessed-Upper Body Bathing: Shower Lower Body Bathing: Contact guard (Education provided to pt on techniques to utilize LH sponge to improve safety with bathing B feet. Pt able to bathe all LB parts with CGA to ensure sitting balance with pt leaning to the R to use L hand to bathe buttocks.) Where Assessed-Lower Body Bathing: Shower Upper Body Dressing: Contact guard (Pt able to doff and don a pull-over shirt with incidental touching for clothing adjustments while sitting. Pt able to recall proper hemi-techniques learned from previous sessions.) Where Assessed-Upper Body Dressing:  (tub-bench prior to shower & BSC after shower) Lower Body Dressing: Minimal assistance (Pt able to participate in all portions of task. Pt able to cross legs to thread B feet through shorts with incidental touching and  requires assistance to pull clothing up on R side when standing with FWW. Pt able to doff with CGA for standing balance.) Where Assessed-Lower Body Dressing:  (tub bench prior to shower & BSC after shower) Toileting:  (Pt declines need a this time.) Social research officer, government: Contact guard (Pt able to complete ambulatory transfer from w/c > BSC > tub-bench with CGA with use of FWW and no LOB noted.) Social research officer, government Method: Heritage manager: Transfer tub bench ADL Comments: Pt participates well in ADL tasks. Pt able to recall hemi-techniqueds with UB dressing. However, unable to recall with LB dressing and requires prompting to thread R foot first. Pt reports increased fatigue after ADL routine. Pt able to complete various ambulatory transfers throughout session from w/c > BSC > tub-bench > w/c with CGA with use of FWW. Pt noted to attempt to involve RUE into all ADL tasks with no cueing required.   Pt requested to stay in the w/c at end of session. Pt left sitting comfortably in the w/c with personal belongings and call light within reach, belt alarm placed and activated, and comfort needs attended to.   Therapy/Group: Individual Therapy  [name] 10/26/2021, 12:33 PM

## 2021-10-26 NOTE — Progress Notes (Signed)
Physical Therapy Session Note  Patient Details  Name: Gerald Bauer MRN: [id number] Date of Birth: Sep 27, 1945  Today's Date: 10/26/2021 PT Individual Time: 0902-1014 PT Individual Time Calculation (min): 72 min   Short Term Goals: Week 1:  PT Short Term Goal 1 (Week 1): Pt will perform supine<>sit with mod assist of 1 PT Short Term Goal 1 - Progress (Week 1): Met PT Short Term Goal 2 (Week 1): Pt will perform sit<>stands with min assist of 1 PT Short Term Goal 2 - Progress (Week 1): Met PT Short Term Goal 3 (Week 1): Pt will perform bed<>chair transfers using LRAD with min assist of 1 PT Short Term Goal 3 - Progress (Week 1): Met PT Short Term Goal 4 (Week 1): Pt will ambulate at least 80ft using LRAD with min assist of 1 PT Short Term Goal 4 - Progress (Week 1): Met PT Short Term Goal 5 (Week 1): Pt will navigate 4 steps using HRs with mod assist of 1 PT Short Term Goal 5 - Progress (Week 1): Met Week 2:  PT Short Term Goal 1 (Week 2): Pt will ambulate 162ft with LRAD and CGA PT Short Term Goal 1 - Progress (Week 2): Met PT Short Term Goal 2 (Week 2): Pt will perform simulated car transfer with LRAD and CGA PT Short Term Goal 2 - Progress (Week 2): Met PT Short Term Goal 3 (Week 2): pt will perform bed mobility with supervision from flat bed without bedrails PT Short Term Goal 3 - Progress (Week 2): Met  Skilled Therapeutic Interventions/Progress Updates:      Therapy Documentation Precautions:  Precautions Precautions: Fall, Other (comment) Precaution Comments: R hemi, R inattention, aphasia, dysphagia Restrictions Weight Bearing Restrictions: No   Pt received seated in recliner listening to country music and declines pain. PT asked if pt needs to utilize bathroom and pt responded with "yes". Pt requires supervision with sit to stand and ambulation with no AD to toilet. Pt performs peri-care in standing with CGA for balance. Nursing provided pt with morning medications during  session. Pt ambulated 200 ft supervision to dayroom and practiced floor transfers in the event of a fall at home. Pt initially required mod A and transitioned to supervision with verbal cues with navigation from prone>short kneeling>tall kneeling with bilateral UE support on mat table>standing. Transitioned to static standing balance training to address low scored sections of Berg assessment. Pt requires CGA to min A with semi-tandem stance with eyes open and eyes closed. Observed greater difficulty with semi-tandem stance with R LE behind L LE. Pt performed NuStep for 8 min with focus on improving R UE grip strength and UE/LE coordination along with activity tolerance. Pt ambulated 200 ft with no AD and CGA to hospital room and left seated in w/c at bedside with call bell on and all needs within reach.    Therapy/Group: Individual Therapy  Gerald Bauer Gerald Bauer PT, DPT  10/26/2021, 7:37 AM

## 2021-10-26 NOTE — Progress Notes (Signed)
Patient ID: Gerald Bauer, male   DOB: 04-13-1945, 76 y.o.   MRN: [id number]  OP orders faxed to [facility].

## 2021-10-26 NOTE — Progress Notes (Addendum)
Speech Language Pathology Daily Session Note  Patient Details  Name: Gerald Bauer MRN: [id number] Date of Birth: 07/03/1945  Today's Date: 10/26/2021 SLP Individual Time: 1115-1200 SLP Individual Time Calculation (min): 45 min  Short Term Goals: Week 3: SLP Short Term Goal 1 (Week 3): STG's = LTG's due to ELOS (8/8)  Skilled Therapeutic Interventions: S: Pt seen this date for skilled ST intervention targeting deglutition and communication goals outlined in care plan. Pt received awake/alert and OOB in w/c. Upon SLP arrival, pt approximated "good morning."  Agreeable to ST intervention in hospital room. Shook his head "yes" when asked if he slept better last night with "do not disturb" order in place.  O: SLP facilitated today's session by providing Set-Up A of Dysphagia 3 textures and thin liquid trials to assess readiness for diet upgrade. Pt consumed with no clinical s/sx concerning for airway invasion/aspiration with Sup A verbal cues, for slow rate, single bites/sips, and alternating bites/sips, at the onset of PO intake. Required Mod A to Mod-Max A to approximate target functional words, during therapeutic confrontational naming task, on ~70% of opportunities. Benefits from sentence completion cues and written choice prompts to aid in verbal approximations. Demonstrated hand gestures to non-verbally communicate words on ~90% of trials with Min-Mod A verbal and visual cues. (e.g. pointing to objects, touching head for "hat", showing gesture for neck tie to communicate "tie," etc.).  A: Pt remains stimulable for skilled ST interventions to include tactile + verbal articulatory cues, verbal/model prompts, use of metronome, written/word cues, supportive communication interventions, and sentence completion prompts. Continues to communicate via non-verbal means (hand and head gestures, facial expressions, and yes/no responses), though is beginning to verbal some single words, that are automatic in  nature, given Min A verbal cues ("hey," "bye," "thank you") for initiation and increased vocal intensity. With Set-Up A, pt self-administered Dysphagia 3 textures with thin liquids with no clinical s/sx concerning for airway invasion with Sup A faded to Mod I. Will continue trialing Dysphagia 3 textures in upcoming sessions to prepare for upcoming d/c and facilitate least restrictive diet at discharge. Per chart review, vital signs remain stable, with exception of HR; no indication concerning for infectious process. Will plan for family education next session.  P: Pt left OOB in w/c with all safety measures activated. Call bell within reach and all immediate needs met. Continue per current ST POC next session.  Pain None/Denies  Therapy/Group: Individual Therapy  Bethany A Lutes 10/26/2021, 1:20 PM 

## 2021-10-27 DIAGNOSIS — M79641 Pain in right hand: Secondary | ICD-10-CM

## 2021-10-27 LAB — GLUCOSE, CAPILLARY
Glucose-Capillary: 117 mg/dL — ABNORMAL HIGH (ref 70–99)
Glucose-Capillary: 101 mg/dL — ABNORMAL HIGH (ref 70–99)

## 2021-10-27 MED ORDER — DICLOFENAC SODIUM 1 % EX GEL
2.0000 g | Freq: Four times a day (QID) | CUTANEOUS | Status: DC
  Administered 2021-10-27 – 2021-10-29 (×4): 2 g via TOPICAL
  Filled 2021-10-27: qty 100

## 2021-10-27 NOTE — Progress Notes (Signed)
Physical Therapy Session Note  Patient Details  Name: Ormond Lazo MRN: [id number] Date of Birth: [birth year]  Today's Date: 10/27/2021 PT Individual Time: 1101-1202 PT Individual Time Calculation (min): 61 min   Short Term Goals: Week 1:  PT Short Term Goal 1 (Week 1): Pt will perform supine<>sit with mod assist of 1 PT Short Term Goal 1 - Progress (Week 1): Met PT Short Term Goal 2 (Week 1): Pt will perform sit<>stands with min assist of 1 PT Short Term Goal 2 - Progress (Week 1): Met PT Short Term Goal 3 (Week 1): Pt will perform bed<>chair transfers using LRAD with min assist of 1 PT Short Term Goal 3 - Progress (Week 1): Met PT Short Term Goal 4 (Week 1): Pt will ambulate at least 5ft using LRAD with min assist of 1 PT Short Term Goal 4 - Progress (Week 1): Met PT Short Term Goal 5 (Week 1): Pt will navigate 4 steps using HRs with mod assist of 1 PT Short Term Goal 5 - Progress (Week 1): Met Week 2:  PT Short Term Goal 1 (Week 2): Pt will ambulate 142ft with LRAD and CGA PT Short Term Goal 1 - Progress (Week 2): Met PT Short Term Goal 2 (Week 2): Pt will perform simulated car transfer with LRAD and CGA PT Short Term Goal 2 - Progress (Week 2): Met PT Short Term Goal 3 (Week 2): pt will perform bed mobility with supervision from flat bed without bedrails PT Short Term Goal 3 - Progress (Week 2): Met Week 3:  PT Short Term Goal 1 (Week 3): STG=LTG due to LOS  Skilled Therapeutic Interventions/Progress Updates:      Therapy Documentation Precautions:  Precautions Precautions: Fall, Other (comment) Precaution Comments: R hemi, R inattention, aphasia, dysphagia Restrictions Weight Bearing Restrictions: No  Pt received seated in w/c at bedside and initially declines pain. Pt reports L shoulder pain following stair training and nursing informed. Pt declined hot pack or ice. Patient's family, son and DIL, present for family education. PT educated family to use RW and right hand  splint at discharge for all mobility and provided instructions on how to guard utilizing a gait belt with functional transfers, gait, stair training, car transfers, and ramp negotiation. Pt's family demonstrated ability to appropriately guard during tasks. Pt required supervision and verbal cues with sit to stand and stand pivot transfers with RW/right hand splint. Pt requires CGA to supervision for safety with ambulation with RW/right hand splint with verbal cues for foot clearance. Pt ambulated ~500 ft during session as he went from hospital room>main gym>ortho gym>hospital room. Pt ambulated approximately 200/500 ft without an AD.  Pt requires CGA with stair negotiation x 12 with 1 HR. Family practiced guarding and provided verbal cues for sequencing/LE foot placement with stair negotiation. Pt required supervision with car transfer and utilized right UE for LE placement in and out of simulated vehicle. Pt performed ramp negotiation with no AD and intermittent railing support once and repeated with RW/right hand splint with CGA for balance. Pt returned to hospital room and left seated in w/c at bedside with all needs in reach and chair alarm on. Patient's family able to assist and support patient with functional mobility tasks during session.     Therapy/Group: Individual Therapy  [name] [name] PT, DPT  10/27/2021, 7:39 AM

## 2021-10-27 NOTE — Discharge Summary (Signed)
Physician Discharge Summary  Patient ID: Gerald Bauer MRN: [id number] DOB/AGE: [birth year] 76 y.o.  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 Objective Swallowing Evaluation: Type of Study: MBS-Modified Barium Swallow Study  Patient Details Name: Gerald Bauer MRN: [id number] Date of Birth: 1945-12-02 Today's Date: 10/19/2021 Time: SLP Start Time (ACUTE ONLY): 33 -SLP Stop Time (ACUTE ONLY): 1405 SLP Time Calculation (min) (ACUTE ONLY): 10 min Past Medical History: Past Medical History: Diagnosis Date  Acid reflux   Arthritis   hands  Carotid atherosclerosis   Diabetes mellitus without complication (HCC)   Hyperlipemia   Hypertension   Prostate cancer ([place])   history  Tachycardia  Past Surgical History: Past Surgical History: Procedure Laterality Date  APPENDECTOMY  1973  CHOLECYSTECTOMY  2013  PROSTATECTOMY  2012 HPI: Pt  is a 76 year old male with history of left-sided carotid stenosis, non-insulin-dependent diabetes mellitus type 2, PAD, GERD, mild COPD, history of tobacco use, hyperlipidemia, hypertension, who presents emergency department for chief concerns of facial droop, expressive aphasia, and R sided weakness.  MRI/MRA Head and Neck Imaging: MRI HEAD       1. 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 No associated hemorrhage or significant regional mass effect.  2. Loss of normal flow voids  throughout the left ICA and MCA,  consistent with slow flow and/or occlusion. Susceptibility artifact  within proximal left MCA branches consistent with intraluminal  thrombus.  3. Underlying mild chronic microvascular ischemic disease. Tiny  remote left ACA distribution infarct.     MRA HEAD IMPRESSION:   "1. Interval near complete occlusion of the left ICA and MCA,  presumably acute in nature given the presence of the acute left MCA  territory infarct. Left A1 segment not well seen either, also likely  occluded.  2. Otherwise wide patency of the major intracranial arterial  circulation. No other hemodynamically significant or correctable  stenosis.     MRA NECK IMPRESSION:  1. 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 Some attenuated flow seen  distally within the cervical left ICA, with subsequent reocclusion  by the skull base.  2. 50% atheromatous stenosis at the origin of the cervical right  ICA. Otherwise wide patency of the right carotid artery system.  3. Wide patency of the vertebral arteries within the neck.".  Subjective: Pt alert, pleasant, and cooperative. Mainly non-verbal with exception of a few automatic utterances. Communicating via gestural communication and facial expression.  Recommendations for follow up therapy are one component of a multi-disciplinary discharge planning process, led by the attending physician.  Recommendations may be updated based on patient status, additional functional criteria and insurance authorization. Assessment / Plan / Recommendation   10/19/2021  10:34 AM Clinical Impressions Clinical Impression MBSS completed. Pt presents with overall mild oropharyngeal dysphagia. Oral phase c/b piecemeal deglutition, decreased mastication, decreased bolus cohesion, premature spillage, and weak lingual manipulation. Pharyngeal phase c/b reduced pharyngeal peristalsis + reduced BOT approximation to PPW, which results in  mild to moderate vallecular and pyriform sinuses residuals (vallecula >  pyriform sinuses for solids, pyriform sinuses > vallecula for liquids). No penetration nor aspiration appreciated with thin liquid via cup, Dysphagia 1 (puree), Dysphagia 2 (chopped), Dysphagia 3 (mechanical soft), or with 13 mm Barium tablet placed in puree. Once instance of sensed aspiration with intake of large bolus of thin liquid via straw due to swallow initiation delay to pyriform sinuses and mistiming of swallow-breath cycle; despite strong reflexive cough aspirant was not fully cleared from trachea. Pt's level of swallow initiation was noted to vary with liquid consumption (over base of epiglottis and at pyriform sinuses) with level of swallow initiation for solids to be consistent at the vallecula. Pharyngeal stasis was partially cleared with reflexive secondary swallow.  Given objective findings, pt's appears appropriate for continuation of Dysphagia 2 textures with advancement of liquids to thin via cup (NO STRAWS) given adherence to general aspiration precautions to include small bites/sips, alternating bites/sips, upright positioning, and lingual sweep for oral clearance. Continue medications whole with puree for safety. May advance solid textures at bedside.  Results reviewed with pt and RN.  SLP Visit Diagnosis Dysphagia, oropharyngeal phase (R13.12);Aphasia (R47.01);Apraxia (R48.2);Dysarthria and anarthria (R47.1) Impact on safety and function Mild aspiration risk     10/09/2021   9:33 AM Treatment Recommendations Treatment Recommendations Therapy as outlined in treatment plan below     10/19/2021  10:35 AM Prognosis Prognosis for Safe Diet Advancement Good Barriers to Reach Goals Language deficits;Severity of deficits;Time post onset   10/19/2021  10:34 AM Diet Recommendations SLP Diet Recommendations Dysphagia 2 (Fine chop) solids;Thin liquid Liquid Administration via Cup;No straw Medication Administration Whole meds with puree  Compensations Slow rate;Small sips/bites;Minimize environmental distractions;Lingual sweep for clearance of pocketing Postural Changes Remain semi-upright after after feeds/meals (Comment);Seated upright at 90 degrees     10/19/2021  10:34 AM Other Recommendations Oral Care Recommendations Oral care BID   10/09/2021   9:33 AM Frequency and Duration  Speech Therapy Frequency (ACUTE ONLY) min 3x week Treatment Duration 2 weeks     10/19/2021  10:30 AM Oral Phase Oral Phase Impaired Oral - Thin Teaspoon NT Oral - Thin Cup Decreased bolus cohesion;Premature spillage;Piecemeal swallowing;Weak lingual manipulation Oral - Thin Straw Weak lingual manipulation;Piecemeal swallowing;Decreased bolus cohesion;Premature spillage Oral - Puree WFL Oral - Mech Soft Impaired mastication;Weak lingual manipulation Oral - Regular NT Oral - Multi-Consistency NT Oral - Pill Premature spillage;Decreased bolus cohesion    10/19/2021  10:32 AM Pharyngeal Phase Pharyngeal Phase Impaired Pharyngeal- Thin Teaspoon NT Pharyngeal- Thin Cup Delayed swallow initiation-vallecula;Delayed swallow initiation-pyriform sinuses;Reduced pharyngeal peristalsis;Reduced tongue base retraction;Pharyngeal residue - valleculae;Pharyngeal residue - pyriform;Lateral channel residue Pharyngeal Material does not enter airway Pharyngeal- Thin Straw Delayed swallow initiation-vallecula;Delayed swallow initiation-pyriform sinuses;Reduced pharyngeal peristalsis;Reduced tongue base retraction;Penetration/Aspiration during swallow;Moderate aspiration;Pharyngeal residue - valleculae;Pharyngeal residue - pyriform;Lateral channel residue Pharyngeal Material enters airway, passes BELOW cords and not ejected out despite cough attempt by patient Pharyngeal- Puree Reduced pharyngeal peristalsis;Reduced tongue base retraction;Pharyngeal residue - valleculae;Pharyngeal residue - pyriform Pharyngeal Material does not enter airway Pharyngeal- Mechanical Soft Reduced pharyngeal  peristalsis;Reduced tongue base retraction;Pharyngeal residue - valleculae;Pharyngeal residue - pyriform Pharyngeal Material does not enter airway Pharyngeal- Regular NT Pharyngeal- Multi-consistency NT Pharyngeal- Pill Reduced pharyngeal peristalsis Pharyngeal Material does not enter airway    10/19/2021  10:34 AM Cervical Esophageal Phase  Cervical Esophageal Phase [facility] A Lutes 10/19/2021, 10:49 AM                     DG Swallowing Func-Speech Pathology  Result Date: 10/09/2021  Quintella Baton, CCC-SLP     10/09/2021 10:12 AM Objective Swallowing Evaluation: Type of Study: MBS-Modified Barium Swallow Study  Patient Details Name: Gerald Bauer MRN: [id number] Date of Birth: 1945-06-13 Today's Date: 10/09/2021 Time: SLP Start Time (ACUTE ONLY): 0815 -SLP Stop Time (ACUTE ONLY): 0840 SLP Time Calculation (min) (ACUTE ONLY): 25 min Past Medical History: Past Medical History: Diagnosis Date  Acid reflux   Arthritis   hands  Carotid atherosclerosis   Diabetes mellitus without complication (HCC)   Hyperlipemia   Hypertension   Prostate cancer ([place])   history  Tachycardia  Past Surgical History: Past Surgical History: Procedure Laterality Date  APPENDECTOMY  1973  CHOLECYSTECTOMY  2013  PROSTATECTOMY  2012 HPI: Pt  is a 76 year old male with history of left-sided carotid stenosis, non-insulin-dependent diabetes mellitus type 2, PAD, GERD, mild COPD, history of tobacco use, hyperlipidemia, hypertension, who presents emergency department for chief concerns of facial droop, expressive aphasia, and R sided weakness.  MRI/MRA Head and Neck Imaging: MRI HEAD IMPRESSION:     1. 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 No associated hemorrhage or significant regional mass effect.  2. Loss of normal flow voids throughout the left ICA and MCA,  consistent with slow flow and/or occlusion. Susceptibility artifact  within proximal left MCA branches consistent with intraluminal  thrombus.  3.  Underlying mild chronic microvascular ischemic disease. Tiny  remote left ACA distribution infarct.     MRA HEAD IMPRESSION:   "1. Interval near complete occlusion of the left ICA and MCA,  presumably acute in nature given the presence of the acute left MCA  territory infarct. Left A1 segment not well seen either, also likely  occluded.  2. Otherwise wide patency of the major intracranial arterial  circulation. No other hemodynamically significant or correctable  stenosis.     MRA NECK IMPRESSION:  1. 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 Some attenuated flow seen  distally within the cervical left ICA, with subsequent reocclusion  by the skull base.  2. 50% atheromatous stenosis at the origin of the cervical right  ICA. Otherwise wide patency of the right carotid artery system.  3. Wide patency of the vertebral arteries within the neck.".  Subjective: Pt alert, pleasant, and cooperative. Mainly non-verbal with exception of a few automatic utterances. Communicating via gestural communication and facial expression.  Recommendations for follow up therapy are one component of a multi-disciplinary discharge planning process, led by the attending physician.  Recommendations may be updated based on patient status, additional functional criteria and insurance authorization. Assessment / Plan / Recommendation   10/09/2021   9:45 AM Clinical Impressions Clinical Impression Pt seen for MBSS. Pt alert, pleasant, and cooperative. Mainly non-verbal with exception of a few automatic utterances. Communicating via gestural communication and facial expressions. Pt presents with a moderate oropharyngeal dysphagia. Oral phase notable for prolonged inefficient mastication of solids and mild oral residual with pureed and solid textures. Oral deficits likely secondary to lingual weakness/incoordination. Pharyngeal phase notable for swallow initiation at the level of the  vallecula/pyriform sinuses, moderate-severe vallecular stasis and mild pyriform sinus stasis (with solid and pureed; increased residual with solid), transient during the swallow penetration with thin liquids via cup sip, transient during the swallow aspiration of thin liquids via cup when used as liquid wash, and audible during the swallow aspiration of thin liquids via straw sip. Pharyngeal deficit secondary to:  decreased base of tongue retraction, mistimed/incomplete laryngeal vestibule closure, and reduced laryngeal elevation. Liquid wash was effective in reducing residual pureed, less effective with solid. Recommend pureed diet with thin liquids with safe swallowing strategies/aspiration precautions as outlined below. Pt shown videofluoroscopic imaging following MBSS for education. Pt also made aware of results, recommendations, and SLP POC. Pt nodding in agreement, ?full understanding given aphasia. SLP to f/u with family.     10/09/2021   9:33 AM Treatment Recommendations Treatment Recommendations Therapy as outlined in treatment plan below     10/06/2021   3:00 PM Prognosis Prognosis for Safe Diet Advancement Good Barriers to Reach Goals Language deficits;Severity of deficits;Time post onset;Behavior Barriers/Prognosis Comment good Family support   10/09/2021   9:33 AM Diet Recommendations SLP Diet Recommendations Dysphagia 1 (Puree) solids;Thin liquid Liquid Administration via No straw Medication Administration Whole meds with puree Compensations Slow rate;Small sips/bites;Minimize environmental distractions;Lingual sweep for clearance of pocketing Postural Changes Remain semi-upright after after feeds/meals (Comment);Seated upright at 90 degrees     10/09/2021   9:33 AM Other Recommendations Oral Care Recommendations Oral care QID;Staff/trained caregiver to provide oral care Follow Up Recommendations Acute inpatient rehab (3hours/day) Assistance recommended at discharge Frequent or constant  Supervision/Assistance Functional Status Assessment Patient has had a recent decline in their functional status and demonstrates the ability to make significant improvements in function in a reasonable and predictable amount of time.   10/09/2021   9:33 AM Frequency and Duration  Speech Therapy Frequency (ACUTE ONLY) min 3x week Treatment Duration 2 weeks     10/09/2021   9:27 AM Oral Phase Oral Phase Impaired Oral - Thin Teaspoon WFL Oral - Thin Cup WFL Oral - Thin Straw WFL Oral - Puree Lingual/palatal residue Oral - Regular Impaired mastication;Weak lingual manipulation;Lingual/palatal residue;Delayed oral transit;Piecemeal swallowing    10/09/2021   9:29 AM Pharyngeal Phase Pharyngeal Phase Impaired Pharyngeal- Thin Teaspoon WFL;Delayed swallow initiation-pyriform sinuses Pharyngeal Material does not enter airway Pharyngeal- Thin Cup Delayed swallow initiation-pyriform sinuses;Penetration/Aspiration during swallow Pharyngeal Material enters airway, remains ABOVE vocal cords then ejected out;Material enters airway, passes BELOW cords then ejected out Pharyngeal- Thin Straw Delayed swallow initiation-pyriform sinuses;Penetration/Aspiration during swallow Pharyngeal Material enters airway, passes BELOW cords and not ejected out despite cough attempt by patient Pharyngeal- Puree Delayed swallow initiation-vallecula;Reduced tongue base retraction;Reduced laryngeal elevation;Pharyngeal residue - valleculae;Pharyngeal residue - pyriform Pharyngeal Material does not enter airway Pharyngeal- Regular Delayed swallow initiation-vallecula;Reduced tongue base retraction;Reduced laryngeal elevation;Pharyngeal residue - valleculae;Pharyngeal residue - pyriform Pharyngeal Material does not enter airway    10/09/2021   9:33 AM Cervical Esophageal Phase  Cervical Esophageal Phase [facility] Cherrie Gauze, M.S., [facility] [phone] [place] Denis) Quintella Baton  10/09/2021, 9:56 AM                      Labs:  Basic Metabolic Panel: Recent Labs  Lab 10/23/21 0525  CREATININE 0.81    CBC: No results for input(s): "WBC", "NEUTROABS", "HGB", "HCT", "MCV", "PLT" in the last 168 hours.  CBG: Recent Labs  Lab 10/25/21 0627 10/25/21 1634 10/26/21 0620 10/26/21 1626 10/27/21 0605  GLUCAP 137* 136* 135* 117* 117*    Brief HPI:   Gerald Bauer is a 76 y.o. male who presented to [place] on 10/05/2021 with right-sided weakness and facial droop as well as expressive aphasia.  Blood pressure was elevated at 155/102.  CT/MRI of the head showed moderate-sized acute ischemic  left MCA distribution infarct.  Neurology recommended Plavix and aspirin 81 mg daily indefinitely.  Tolerating dysphagia 1 diet with nectar thick liquids.   Hospital Course: Gerald Bauer was admitted to rehab 10/09/2021 for inpatient therapies to consist of PT, ST and OT at least three hours five days a week. Past admission physiatrist, therapy team and rehab RN have worked together to provide customized collaborative inpatient rehab. Constipation treated with sorbitol as well as senna q HS. Advanced to D2 with nectar thick liquids with water protocol on 7/29. Tachycardia noted and given magnesium>>increased to 500 mg q HS. Flomax continued for urinary retention.   Blood pressures were monitored on TID basis and remained well controlled with Norvasc 5 mg daily.  Diabetes has been monitored with ac/hs CBG checks and BS glucose in good control on metformin 1000 mg BID.   Rehab course: During patient's stay in rehab weekly team conferences were held to monitor patient's progress, set goals and discuss barriers to discharge. At admission, patient required moderate to maximal assistance for mobility and for most ADLs.  He has had improvement in activity tolerance, balance, postural control as well as ability to compensate for deficits. He has had  improvement in functional use RUE/LUE  and RLE/LLE as well as improvement in awareness. Ambulated 150 feet with supervision and RW hand splint.  Disposition: Home  There are no questions and answers to display.         Diet: heart healthy, carb controlled  Special Instructions: No driving, alcohol consumption or tobacco use.   Discharge Instructions     Ambulatory referral to Neurology   Complete by: As directed    An appointment is requested in approximately: 4 weeks left MCA infarction      Allergies as of 10/27/2021       Reactions   Amoxicillin Swelling   Statins Other (See Comments)   Affects the liver   Welchol [colesevelam Hcl] Other (See Comments)   affects the liver     Med Rec must be completed prior to using this [facility]***       Follow-up Information     Raulkar, Clide Deutscher, MD Follow up.   Specialty: Physical Medicine and Rehabilitation Why: Office to call for appointment Contact information: [address] [phone]                 Signed: Barbie Banner 10/27/2021, 9:28 AM

## 2021-10-27 NOTE — Progress Notes (Addendum)
Speech Language Pathology Daily Session Note  Patient Details  Name: Gerald Bauer MRN: [id number] Date of Birth: [birth year]  Today's Date: 10/27/2021 SLP Individual Time: 0930-1015 SLP Individual Time Calculation (min): 45 min  Short Term Goals: Week 3: SLP Short Term Goal 1 (Week 3): STG's = LTG's due to ELOS (8/8)  Skilled Therapeutic Interventions: S: Pt seen this date for skilled ST intervention targeting deglutition and communication goals outlined in care plan. Pt received in bed with son and DIL present. Son stating pt has to use the bathroom. Pt transferred from sit<>stand + RW, Min A for R attention to hand and placement on walker; ambulated to bathroom with RW and CGA. Continent of urine. Transferred back to bed in same manner. No c/o pain. With use of multimodal communication techniques, pt communicated desire to stay in bed with use of gestures when given binary choice prompts. Agreeable to ST intervention at bedside.   O: SLP facilitated today's session by providing skilled pt and family education re: stroke recovery, aphasia + apraxia, supportive communication interventions/techniques to decrease frustration and improve pt's participation in verbalizations, use of low-tech AAC picture boards (which were provided to take home - SLP demonstrated use of boards), home exercise program via [employer] application, aspiration precautions, diet recommendations, appropriate textures (mechanical soft, chopped, and pureed + avoiding mixed consistencies), MBSS results, and community resources ([facility] and virtual speech groups via TAP); pt's family verbalized understanding of all information and appeared appreciative. Pt continues to benefit from Mod A verbal cues (sentence completion prompts) for verbalizations of single syllable (CVC) words. Continues to benefit from yes/no questions, use of gestures, written choice prompts, or verbal binary choice prompts to communicate  wants, needs, and ideas. Re: deglutition, pt accepted PO trials of Dysphagia 3 textures with thin liquid and intermittent throat clear. Strong, dry cough was noted with large sip of water via water bottle, which lingered for a while. Benefited from Sup A at onset of PO intake to implement aspiration precautions. SpO2 and HR parameters obtained following PO trials; SpO2 was 100% and HR 94.   A: Pt remains stimulable for skilled ST interventions to include tactile + verbal articulatory cues, verbal/model prompts, use of metronome, written/word cues, supportive communication interventions, and sentence completion prompts. Continues to communicate via non-verbal means (hand and head gestures, facial expressions, and yes/no responses), though is beginning to verbal some single words, that are automatic in nature, given Min A verbal cues ("hey," "bye," "thank you") for initiation and increased vocal intensity. With Set-Up A, pt self-administered Dysphagia 3 textures with thin liquids with minimal throat clearing given Sup A faded to Mod I. Will continue trialing Dysphagia 3 textures (trial tray, if time) in upcoming sessions to prepare for upcoming d/c and facilitate least restrictive diet at home.  P: Pt left in bed with family present and all safety measures activated. Direct hand off to OT for family education. Continue per current ST POC next session.   Pain None/Denies  Therapy/Group: Individual Therapy  Luellen Howson A Cherith Tewell 10/27/2021, 1:19 PM

## 2021-10-27 NOTE — Progress Notes (Signed)
Occupational Therapy Session Note  Patient Details  Name: Gerald Bauer MRN: [id number] Date of Birth: 05-25-1945  Today's Date: 10/27/2021 OT Individual Time: 1021-1100 & 1404 - 1522 OT Individual Time Calculation (min): 39 min & 78 min    Short Term Goals: Week 2:  OT Short Term Goal 1 (Week 2): STG = LTG 2/2 ELOS  First Session (1021 - 1100) - Skilled Therapeutic Interventions/Progress Updates:  Pt awake in bed with family (son & DIL) present for family training upon OT arrival to the room. Pt reports, "Yeah." Pt in agreement for OT session.  Therapy Documentation Precautions:  Precautions Precautions: Fall, Other (comment) Precaution Comments: R hemi, R inattention, aphasia, dysphagia Restrictions Weight Bearing Restrictions: No Vital Signs: Please see "Flowsheet" for most recent vitals charted by nursing staff.  Pain: Pain Assessment Pain Scale: 0-10 Pain Score: 0-No pain  ADL: Grooming: Setup (Pt able to complete oral care with set-up assist while seated in the w/c at the sink.) Where Assessed-Grooming: Sitting at sink, Wheelchair Toileting:  (Pt declines need at this time.) Toilet Transfer: Contact guard (Pt able to complete ambulatory transfer from w/c <> standard toilet with CGA with use of grab bars on L side for sit <> stand transition from a standard toilet.) Toilet Transfer Method: Counselling psychologist: Grab bars ADL Comments: Pt demo's good awareness of RUE throughout ADL and functional mobility tasks. Pt able to complete various ambulatory transfers with CGA with use of FWW with saddle splint for R hand from EOB > w/c > tub-bench in tub-room > room > w/c with minimal VCs for proper clearance of R foot during ambulation. Family education provided throughout session. See below for details of family training.  FAMILY TRAINING: Pt's son and DIL present for family training. OT provides education on recommendation of strict 24-hour assistance due to pt  having some impulsivity that can increase fall risks after DC home. OT provides education on pt's current level of functioning that pt is able to complete ADLs with minimal assistance - CGA for safety with use of hemi-techniques, AE, and FWW for safety. OT provided education on recommendation to use gait belt and FWW with all mobility within the home to decrease fall risks. Pt's family reports that pt has a walk-in shower with a sliding door and a tub-shower with a curtain. OT provides education that the tub-shower can be safer for pt to use with a tub-transfer bench than using the walk in. OT then transports pt to tub-room to provide a skilled demo on tub-bench transfer. Pt able to complete a tub-bench transfer with R entry (same as home set-up) with CGA for sitting balance and managing RUE over the tub-ledge. Provided education on recommendation of grab bars to allow pt to safely position RUE on grab bar as a stabilizer when leaning to bathe buttocks. Educated family on showering routine to use a robe, non-skid socks or shoes, and FWW with gait belt after pt doffs clothing to then perform tub-transfer. Then, pt can doff footwear and robe to allow for increased privacy after transferring into the shower. Then, emphasized the importance of donning non-skid footwear after the shower prior to performing functional mobility.   Educated family on importance of providing CGA on the pt's R side to decrease fall risks and that pt needs to utilize FWW with all mobility. Provided skilled education and demo on how pt benefits from taking a standing rest break if pt is noted to have decreased R foot clearance and  benefits from VC's to improve clearance. Pt's family inquired about pt completing community mobility and OT encouraged family to wait to allow OP therapy to guide family on when pt can safely perform. Educated family that pt can have increased difficulty with adequate attention to motor planning needed with RLE  clearance when ambulating and encouraged a more quiet environment if pt is performing more mobility. Family verbalized understanding on education provided and AE/DME that was recommended (FWW, tub-transfer bench, grab bars). Pt's family reports plan to place and adjust grab bars around the toilet and in the shower. Pt has a LH-sponge and bath mitt already in room. OT provides education on tools that are in room and activities to encourage pt to continue as part of RUE neuro re-education after DC. Pt and family report no other concerns for DC to home.   Pt requested to stay in the w/c at end of session. Pt left sitting comfortably in the w/c with personal belongings and call light within reach, belt alarm placed and activated, and comfort needs attended to.   --------------------------------------------------------------------------------------------------------------------------------------------------------------------------------------------------------------------------  Second Session (1404 - 1522) - Skilled Therapeutic Interventions/Progress Updates:  Pt awake in w/c upon OT arrival to the room. Pt reports, "No," when asked about pain Pt in agreement for OT session.  Therapy Documentation Pain:  10/27/21 1404  Pain Assessment  Pain Scale 0-10  Pain Score 0    Pre-Community Mobility: Pt participates in pre-community mobility task in order to improve functional mobility skills needed to navigate within the community. Pt able to perform functional ambulation in and outdoor environment on the sidewalk with use of FWW and saddle splint for R hand. Pt able to ambulate ~150' with CGA and minimal VC's for proper clearance and advancement of RLE. Pt demo's no LOB. However, demo's 1 small collision with FWW on R side when returning to w/c. Pt able to transition over cracks in the sidewalk and down a slight slope with no LOB.   Therapeutic Activity: Pt participates in therapeutic activity in order to  improve RUE motor control which is needed for increased independence with ADL's and functional mobility. Pt able to perform reciprocal movements to grasp and perform shoulder flexion on a vertical object such as a hockey stick approx 10 reps x 3 trails with pt noted to have improve grip strength on the stick. However, pt noted to use compensatory movements to perform shoulder flexion. Pt unable to perform shoulder flexion >90 degrees while seated in the w/c. Pt then participates in bilateral coordination and motor control task to fold washcloths with moderate difficulty noted with using a lateral pinch to grasp the material. Pt able to perform towel scrunches with R hand approx 5 times with improved fine motor control to perform individual digital movements needed to scrunch towel. OT created a list of neuro re-education tasks that pt already has the tools for or household items that pt can use for family to encourage or provide tools for pt to continue with RUE neuro re-education after DC.  Functional Mobility: Pt able to perform functional ambulation from room > therapy gym (>150') with no AD and CGA with minimal VC's for proper clearance of RLE. Pt then able to retrieve FWW and ambulate back to room with CGA and minimal VC's for R foot clearance again. Pt demo's improved activity tolerance and endurance with various walks this session.   ADL's: Once returned to room, pt able to perform ambulatory toilet transfer with close supervision and toileting tasks (voiding  bladder) with minimal assist for clothing management to pull up on R side. Pt then able to perform ambulatory transfer to sink with close supervision and perform hand hygiene standing at the sink with close supervision. Pt perform ambulatory transfer, doff shoes, and perform sit > supine transfer with supervision.    Pt returned to bed at end of session. Pt left resting comfortably in bed with personal belongings and call light within reach, bed  alarm on and activated, bed in low position, 3 bed rails up, and comfort needs attended to.   Therapy/Group: Individual Therapy  [name] 10/27/2021, 4:49 PM

## 2021-10-27 NOTE — Progress Notes (Signed)
PROGRESS NOTE   Subjective/Complaints:  Pt reports his right hand wrist has been hurting. Denies injury to this area.    ROS: limited by aphasia  Objective:   No results found.  No results for input(s): "WBC", "HGB", "HCT", "PLT" in the last 72 hours.  No results for input(s): "NA", "K", "CL", "CO2", "GLUCOSE", "BUN", "CREATININE", "CALCIUM" in the last 72 hours.     Intake/Output Summary (Last 24 hours) at 10/27/2021 1327 Last data filed at 10/27/2021 1259 Gross per 24 hour  Intake 674 ml  Output --  Net 674 ml         Physical Exam: Vital Signs Blood pressure 125/82, pulse (!) 108, temperature 97.6 F (36.4 C), temperature source Oral, resp. rate 16, height '5\' 7"'$  (1.702 m), weight 74.5 kg, SpO2 100 %.    General: awake, alert, appropriate, supine in bed;NAD HENT: [place], AT, conjugate gaze; oropharynx moist CV: regular /borderline tachycardic rate; no JVD Pulmonary: CTA B/L; no W/R/R- good air movement GI: soft, NT, ND, (+)BS Psychiatric: appropriate- pleasant Neurological: alert- aphasic- very few words spoken Skin: warm and dry, no breakdown noted Neurologic: Cranial nerves II through XII intact, motor strength is 5/5 in left deltoid, bicep, tricep, grip, hip flexor, knee extensors, ankle dorsiflexor and plantar flexor RUE 2- RLE 4/5 Sensory exam normal sensation to light touch and proprioception in bilateral upper and lower extremities Expressive aphasia ongoing, uses head nods primarily for me, tries to vocalize Impaired attention to right side, improving Impaired safety awareness Musc: right knee/shoulder discomfort with ROM, palpation R hand and wrist with diffuse mild tenderness to palpation. No significant swelling or signs of infection. Ambulating with RW CG     Assessment/Plan: 1. Functional deficits which require 3+ hours per day of interdisciplinary therapy in a comprehensive inpatient rehab  setting. Physiatrist is providing close team supervision and 24 hour management of active medical problems listed below. Physiatrist and rehab team continue to assess barriers to discharge/monitor patient progress toward functional and medical goals  Care Tool:  Bathing    Body parts bathed by patient: Right arm, Left arm, Chest, Abdomen, Front perineal area, Buttocks, Right upper leg, Left upper leg, Right lower leg, Left lower leg, Face   Body parts bathed by helper: Left arm, Front perineal area, Buttocks, Right lower leg, Left lower leg     Bathing assist Assist Level: Contact Guard/Touching assist     Upper Body Dressing/Undressing Upper body dressing   What is the patient wearing?: Pull over shirt    Upper body assist Assist Level: Supervision/Verbal cueing    Lower Body Dressing/Undressing Lower body dressing      What is the patient wearing?: Incontinence brief, Pants     Lower body assist Assist for lower body dressing: Minimal Assistance - Patient > 75%     Toileting Toileting Toileting Activity did not occur Landscape architect and hygiene only): Refused  Toileting assist Assist for toileting: Independent with assistive device Assistive Device Comment: walker   Transfers Chair/bed transfer  Transfers assist     Chair/bed transfer assist level: Supervision/Verbal cueing     Locomotion Ambulation   Ambulation assist      Assist  level: Supervision/Verbal cueing Assistive device: No Device Max distance: 200 feet   Walk 10 feet activity   Assist     Assist level: Supervision/Verbal cueing Assistive device: No Device   Walk 50 feet activity   Assist Walk 50 feet with 2 turns activity did not occur: Safety/medical concerns  Assist level: Supervision/Verbal cueing Assistive device: No Device    Walk 150 feet activity   Assist Walk 150 feet activity did not occur: Safety/medical concerns  Assist level: Supervision/Verbal  cueing Assistive device: No Device    Walk 10 feet on uneven surface  activity   Assist Walk 10 feet on uneven surfaces activity did not occur: Safety/medical concerns   Assist level: Supervision/Verbal cueing Assistive device: Walker-rolling, Other (comment) (right hand splint)   Wheelchair     Assist Is the patient using a wheelchair?: Yes Type of Wheelchair: Manual    Wheelchair assist level: Minimal Assistance - Patient > 75% Max wheelchair distance: 500 feet or greater    Wheelchair 50 feet with 2 turns activity    Assist        Assist Level: Minimal Assistance - Patient > 75%   Wheelchair 150 feet activity     Assist      Assist Level: Minimal Assistance - Patient > 75%   Blood pressure 125/82, pulse (!) 108, temperature 97.6 F (36.4 C), temperature source Oral, resp. rate 16, height '5\' 7"'$  (1.702 m), weight 74.5 kg, SpO2 100 %.  Medical Problem List and Plan: 1. 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  Impaired mobility, ambulating 270 feet: d/c lovenox. Continue Aspirin 81 mg daily and Plavix 75 mg daily to continue indefinitely 3. Shoulder arthritis: voltaren gel ordered, changed to prn. Discussed with patient. Discussed with therapy that I do not usually see spikes in blood pressure with topical voltaren gel, we can try again today and assess for these changes in vitals. Tylenol as needed. Provided pain relief journal  7/31- denied need for pain meds this AM- con't regimen 4. Mood/Behavior/Sleep: Provide emotional support             -antipsychotic agents: N/A 5. Neuropsych/cognition: This patient is not capable of making decisions on his own behalf. 6.  Skin/Wound Care: Routine skin checks 7. Fluids/Electrolytes/Nutrition: Routine in and outs with follow-up chemistries 8.  Hypertension.  Continue Norvasc 5 mg daily.  Monitor with increased mobility  8/4 well controlled today 9.  Diabetes mellitus.  Hemoglobin A1c 7.1. d/c ISS. Patient maintained on Increase metformin to 1,000 mg BID  Decrease CBG checks to AC BID  Discussed minimizing foods with added sugar  CBG (last 3)  Recent Labs    10/26/21 0620 10/26/21 1626 10/27/21 0605  GLUCAP 135* 117* 117*      8/3 good control, continue metformin 10.  Hyperlipidemia.  Intolerant to statin. Continue Repatha 140 mg subcutaneous every 2 weeks prior to admission. 11.  History of prostate cancer/prostatectomy 2024.  Follow-up outpatient 12. Hypoalbuminemia: recommended choosing foods high in protein and low in added sugar.  13. Constipation: increase magnesium gluconate to '500mg'$  HS. Provide  dietary education. Resolved with sorbitol. D/c senna-docusate  LBM 8/4 improved 14. Overweight BMI 25.72: provide dietary education. Provided daughter in law with list of foods that can assist with weight loss.  15. Expressive aphasia: continue SLP 16. 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 Urinary retention: continue flomax HS 38. Fall yesterday: advised that flomax can cause falls by decreasing blood pressure, discussed this is why we give Flomax at night 20. Impaired problem solving and motor planning: continue SLP and OT 21. Tachycardia: Increase magnesium gluconate to '500mg'$  HS 22. Insomnia  -Will place order to minimize disturbances at night 23. R hand hand, likely from increased  use with therapy  -Voltaren gel    LOS: 18 days A FACE TO FACE EVALUATION WAS PERFORMED  Jennye Boroughs 10/27/2021, 1:27 PM

## 2021-10-27 NOTE — Progress Notes (Signed)
Patient ID: Gerald Bauer, male   DOB: Feb 12, 1946, 76 y.o.   MRN: [id number]  Patient family receiving nursing education. Patient son requesting RW. No additional questions or concerns.

## 2021-10-28 LAB — GLUCOSE, CAPILLARY: Glucose-Capillary: 117 mg/dL — ABNORMAL HIGH (ref 70–99)

## 2021-10-28 NOTE — Plan of Care (Signed)
  Problem: Consults Goal: RH STROKE PATIENT EDUCATION Description: See Patient Education module for education specifics  Outcome: Progressing   Problem: RH BOWEL ELIMINATION Goal: RH STG MANAGE BOWEL WITH ASSISTANCE Description: STG Manage Bowel with toileting Assistance. Outcome: Progressing   Problem: RH BLADDER ELIMINATION Goal: RH STG MANAGE BLADDER WITH ASSISTANCE Description: STG Manage Bladder With toileting Assistance Outcome: Progressing   Problem: RH SAFETY Goal: RH STG ADHERE TO SAFETY PRECAUTIONS W/ASSISTANCE/DEVICE Description: STG Adhere to Safety Precautions With cues Assistance/Device. Outcome: Progressing   Problem: RH KNOWLEDGE DEFICIT Goal: RH STG INCREASE KNOWLEDGE OF DIABETES Description: Patient and son will be able to manage DM with medications and dietary modifications using handouts and educational resources independently  Outcome: Progressing Goal: RH STG INCREASE KNOWLEDGE OF HYPERTENSION Description: Patient and son will be able to manage HTN with medications and dietary modifications using handouts and educational resources independently  Outcome: Progressing Goal: RH STG INCREASE KNOWLEGDE OF HYPERLIPIDEMIA Description: Patient and son will be able to manage HLD with medications and dietary modifications using handouts and educational resources independently  Outcome: Progressing Goal: RH STG INCREASE KNOWLEDGE OF STROKE PROPHYLAXIS Description: Patient and son will be able to manage secondary risks with medications and dietary modifications using handouts and educational resources independently  Outcome: Progressing

## 2021-10-28 NOTE — Progress Notes (Signed)
Occupational Therapy Session Note  Patient Details  Name: Ethon Wymer MRN: [id number] Date of Birth: April 29, 1945  Today's Date: 10/28/2021 OT Individual Time: 5681-2751 & 1445-1530 OT Individual Time Calculation (min): 60 min & 45 min   Short Term Goals: Week 2:  OT Short Term Goal 1 (Week 2): STG = LTG 2/2 ELOS  Skilled Therapeutic Interventions/Progress Updates:    Session 1: Upon OT arrival, pt semi recumbent in bed and reports no pain. Pt agreeable to OT treatment. Treatment intervention with a focus on self care retraining, coordination, functional mobility, and quality of life. Pt completes sponge bath ADL at the levels below while sinkside. Pt completes functional bed mobility with Supervision and stand pivot transfer with SBA. Pt requesting to go outside after ADL. Pt propels himself to elevator with Supervision noting decreased coordination in the R UE. Pt requires Min A once he reached 1st level of hospital and was transported outside. Pt requests to walk. Pt completes sit to stand transfer with SBA and ambulates with CGA using RW ~300'. Pt returns to w/c with SBA and remains in w/c to complete deep breathing exercises as pt requests to stay outside as long as he can. Pt was transported back to his room and remains in w/c at end of session with all needs met.     Session 2: Upon OT arrival, pt seated in w/c reporting no pain and requesting to work on his R UE. Pt agreeable to OT treatment session. Pt was transported to main therapy gym via w/c and Total A for time management. While seated at tabletop, pt rolls tan theraputty into a snake form using the R UE demonstrating min difficulty. Pt was directed to push beads into theraputty with R UE but demonstrates max difficulty. Therapist placed beads into theraputty and rolled theraputty up into a ball for pt to manipulate thearputty in order to retrieve beads using B UE with a focus on the R UE. Pt demonstrates max difficulty requiring  increased time and was able to retrieve 8 beads within 25 minutes. Pt then performs UE ROM including 1x10 reps of shoulder elevation/depression and scapular protraction/retraction. Pt appears to demo difficulty motor planning during fine motor activities. Pt engaged in ball toss using 1lb dowel rod and performed 2x20 reps with min difficulty and incoordination. Pt attempts to punch ball back to therapist with the R UE only demonstrating mod difficulty. Pt was returned to his room via w/c and Total A and left in w/c at end of session with all needs met.   Therapy Documentation Precautions:  Precautions Precautions: Fall, Other (comment) Precaution Comments: R hemi, R inattention, aphasia, dysphagia Restrictions Weight Bearing Restrictions: Yes   ADL: Grooming: Supervision/safety Where Assessed-Grooming: Sitting at sink Upper Body Bathing: Supervision/safety Where Assessed-Upper Body Bathing: Sitting at sink Where Assessed-Lower Body Bathing: Shower Upper Body Dressing: Supervision/safety Where Assessed-Upper Body Dressing: Sitting at sink Lower Body Dressing: Supervision/safety (shoes only) Where Assessed-Lower Body Dressing: Wheelchair    Therapy/Group: Individual Therapy  Marvetta Gibbons 10/28/2021, 12:13 PM

## 2021-10-29 LAB — GLUCOSE, CAPILLARY: Glucose-Capillary: 129 mg/dL — ABNORMAL HIGH (ref 70–99)

## 2021-10-29 NOTE — Progress Notes (Signed)
Speech Language Pathology Daily Session Note  Patient Details  Name: Talan Gildner MRN: [id number] Date of Birth: 1945/06/22  Today's Date: 10/29/2021 SLP Individual Time: 0820-0900 SLP Individual Time Calculation (min): 40 min  Short Term Goals: Week 3: SLP Short Term Goal 1 (Week 3): STG's = LTG's due to ELOS (8/8)  Skilled Therapeutic Interventions:   Pt was seen for skilled ST targeting goals for swallowing and communication.  Pt was awake in bed upon therapist's arrival, agreeable to participating in treatment.  SLP facilitated the session with a trial snack of dys 3 textures and thin liquids to continue working towards diet progression.  Pt consumed advanced textures and thin liquids with supervision cues for use of swallowing precautions and no overt s/s of aspiration across any consistencies.  Following meal, pt completed oral care at the sink with min assist needed to squeeze toothpaste from tube.  He then requested to use the bathroom by pointing where he was continent of bladder.  No overt safety concerns noted throughout ADLs and subsequent ambulation.  Pt was noted to verbally respond to close ended/choice of two question with the initial sound of a single targeted word in ~50% of opportunities.  Pt's answers were verified by head shake/nod to follow up yes/no questions.  Pt was left in wheelchair per request with chair alarm set and call bell within reach.  Continue per current plan of care.     Pain Pain Assessment Pain Scale: 0-10 Pain Score: 0-No pain  Therapy/Group: Individual Therapy  Nysha Koplin, Selinda Orion 10/29/2021, 9:02 AM

## 2021-10-29 NOTE — Progress Notes (Signed)
PROGRESS NOTE   Subjective/Complaints:  Pt reports no issues- no pain; slept well; LBM yesterday- SLP in room.   ROS: limited by aphasia  Objective:   No results found.  No results for input(s): "WBC", "HGB", "HCT", "PLT" in the last 72 hours.  No results for input(s): "NA", "K", "CL", "CO2", "GLUCOSE", "BUN", "CREATININE", "CALCIUM" in the last 72 hours.     Intake/Output Summary (Last 24 hours) at 10/29/2021 1431 Last data filed at 10/28/2021 2039 Gross per 24 hour  Intake 120 ml  Output --  Net 120 ml        Physical Exam: Vital Signs Blood pressure 126/85, pulse 85, temperature 97.9 F (36.6 C), temperature source Oral, resp. rate 18, height '5\' 7"'$  (1.702 m), weight 74.5 kg, SpO2 98 %.     General: awake, alert, appropriate, sitting up in bed eating with SLP; NAD HENT: conjugate gaze; oropharynx moist CV: regular rate; no JVD Pulmonary: CTA B/L; no W/R/R- good air movement GI: soft, NT, ND, (+)BS Psychiatric: appropriate Neurological: aphasic- nodding yes and no- no speech Skin: warm and dry, no breakdown noted Neurologic: Cranial nerves II through XII intact, motor strength is 5/5 in left deltoid, bicep, tricep, grip, hip flexor, knee extensors, ankle dorsiflexor and plantar flexor RUE 2- RLE 4/5 Sensory exam normal sensation to light touch and proprioception in bilateral upper and lower extremities Expressive aphasia ongoing, uses head nods primarily for me, tries to vocalize Impaired attention to right side, improving Impaired safety awareness Musc: right knee/shoulder discomfort with ROM, palpation R hand and wrist with diffuse mild tenderness to palpation. No significant swelling or signs of infection. Ambulating with RW CG     Assessment/Plan: 1. Functional deficits which require 3+ hours per day of interdisciplinary therapy in a comprehensive inpatient rehab setting. Physiatrist is providing  close team supervision and 24 hour management of active medical problems listed below. Physiatrist and rehab team continue to assess barriers to discharge/monitor patient progress toward functional and medical goals  Care Tool:  Bathing    Body parts bathed by patient: Right arm, Left arm, Chest, Abdomen, Face   Body parts bathed by helper: Left arm, Front perineal area, Buttocks, Right lower leg, Left lower leg     Bathing assist Assist Level: Supervision/Verbal cueing     Upper Body Dressing/Undressing Upper body dressing   What is the patient wearing?: Pull over shirt    Upper body assist Assist Level: Supervision/Verbal cueing    Lower Body Dressing/Undressing Lower body dressing      What is the patient wearing?: Incontinence brief, Pants     Lower body assist Assist for lower body dressing: Minimal Assistance - Patient > 75%     Toileting Toileting Toileting Activity did not occur Landscape architect and hygiene only): Refused  Toileting assist Assist for toileting: Independent with assistive device Assistive Device Comment: walker   Transfers Chair/bed transfer  Transfers assist     Chair/bed transfer assist level: Supervision/Verbal cueing     Locomotion Ambulation   Ambulation assist      Assist level: Supervision/Verbal cueing Assistive device: No Device Max distance: 200 feet   Walk 10 feet activity  Assist     Assist level: Supervision/Verbal cueing Assistive device: No Device   Walk 50 feet activity   Assist Walk 50 feet with 2 turns activity did not occur: Safety/medical concerns  Assist level: Supervision/Verbal cueing Assistive device: No Device    Walk 150 feet activity   Assist Walk 150 feet activity did not occur: Safety/medical concerns  Assist level: Supervision/Verbal cueing Assistive device: No Device    Walk 10 feet on uneven surface  activity   Assist Walk 10 feet on uneven surfaces activity did not  occur: Safety/medical concerns   Assist level: Supervision/Verbal cueing Assistive device: Walker-rolling, Other (comment) (right hand splint)   Wheelchair     Assist Is the patient using a wheelchair?: Yes Type of Wheelchair: Manual    Wheelchair assist level: Minimal Assistance - Patient > 75% Max wheelchair distance: 500 feet or greater    Wheelchair 50 feet with 2 turns activity    Assist        Assist Level: Minimal Assistance - Patient > 75%   Wheelchair 150 feet activity     Assist      Assist Level: Minimal Assistance - Patient > 75%   Blood pressure 126/85, pulse 85, temperature 97.9 F (36.6 C), temperature source Oral, resp. rate 18, height '5\' 7"'$  (1.702 m), weight 74.5 kg, SpO2 98 %.  Medical Problem List and Plan: 1. 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  Impaired mobility, ambulating 270 feet: d/c lovenox. Continue Aspirin 81 mg daily and Plavix 75 mg daily to continue indefinitely 3. Shoulder arthritis: voltaren gel ordered, changed to prn. Discussed with patient. Discussed with therapy that I do not usually see spikes in blood pressure with topical voltaren gel, we can try again today and assess for these changes in vitals. Tylenol as needed. Provided pain relief journal  7/31- denied need for pain meds this AM- con't regimen  8/6- no pain today- con't regimen 4. Mood/Behavior/Sleep: Provide emotional support             -antipsychotic agents: N/A 5. Neuropsych/cognition: This patient is not capable of making decisions on his own behalf. 6. Skin/Wound Care: Routine skin checks 7. Fluids/Electrolytes/Nutrition: Routine in  and outs with follow-up chemistries 8.  Hypertension.  Continue Norvasc 5 mg daily.  Monitor with increased mobility  8/6- BP controlled- con't regimen 9.  Diabetes mellitus.  Hemoglobin A1c 7.1. d/c ISS. Patient maintained on Increase metformin to 1,000 mg BID  Decrease CBG checks to AC BID  Discussed minimizing foods with added sugar  CBG (last 3)  Recent Labs    10/27/21 1640 10/28/21 0637 10/29/21 0613  GLUCAP 101* 117* 129*     8/3 good control, continue metformin  8/6- good control- con't regimen 10.  Hyperlipidemia.  Intolerant to statin. Continue Repatha 140 mg subcutaneous every 2 weeks prior to admission. 11.  History of prostate cancer/prostatectomy 2024.  Follow-up outpatient 12. Hypoalbuminemia: recommended choosing foods high in protein and low in added sugar.  13. Constipation: increase magnesium gluconate to '500mg'$  HS. Provide  dietary education. Resolved with sorbitol. D/c senna-docusate  LBM 8/4 improved  8/6- LBM yesterday 14. Overweight BMI 25.72: provide dietary education. Provided daughter in law with list of foods that can assist with weight loss.  15. Expressive aphasia: continue SLP 16. 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 Urinary retention: continue flomax HS 75. Fall yesterday: advised that flomax can cause falls by decreasing blood pressure, discussed this is why we give Flomax at night 20. Impaired problem solving and motor planning: continue SLP and OT 21. Tachycardia: Increase magnesium gluconate to '500mg'$  HS 22. Insomnia  -Will place order to minimize disturbances at night 23. R hand hand, likely from increased  use with therapy  -Voltaren gel    LOS: 20 days A FACE TO FACE EVALUATION WAS PERFORMED  Gerald Bauer 10/29/2021, 2:31 PM

## 2021-10-29 NOTE — Progress Notes (Signed)
Physical Therapy Session Note  Patient Details  Name: Gerald Bauer MRN: [id number] Date of Birth: [birth year]  Today's Date: 10/29/2021 PT Individual Time: 1030-1115 PT Individual Time Calculation (min): 45 min   Short Term Goals: Week 3:  PT Short Term Goal 1 (Week 3): STG=LTG due to LOS  Skilled Therapeutic Interventions/Progress Updates: Pt presented in w/c agreeable to therapy. Pt denies pain during session and no pain behaviors noted. Pt provided with RW and ambulated to day room CGA with cues for increased R foot clearance which pt was able to perform but with limited carryover. Pt participated in stepping over shuffleboard sticks leading with RLE. Pt with poor awareness/accuracy stepping on stick 4/8 times. PTA then placed colored dots/target to give pt visual target with significantly improved results. Pt then performed same task with agility ladder with pt able to step clearly over "rungs" and to target ~75% of time. Pt also participated in toe taps to 4in step with colored dots for visual target 3 x 10 for forced use of RLE. Pt initially pulling RLE off step with minimal hip flexion. With verbal cues pt was able to clear step consistently until fatigued. Participated in static standing balance standing on Airex for increased ankle strategy with PTA providing mild perbutations without LOB. Pt also participated in a round of corn hole while standing on Airex and performing reaching/tossing tasks with RUE. Pt then ambulated back to room with RW and CGA with some improvement in R foot clearance. Pt returned to w/c at end of session and left with belt alarm on, call bell within reach and current needs met.      Therapy Documentation Precautions:  Precautions Precautions: Fall, Other (comment) Precaution Comments: R hemi, R inattention, aphasia, dysphagia Restrictions Weight Bearing Restrictions: Yes General:   Vital Signs:   Pain:   Mobility:   Locomotion :    Trunk/Postural  Assessment :    Balance:   Exercises:   Other Treatments:      Therapy/Group: Individual Therapy  Tejal Monroy 10/29/2021, 4:24 PM

## 2021-10-29 NOTE — Plan of Care (Signed)
Patient continues to be eager to learn.  Son very caring / supportive.  Patient states will work very hard in therapies to have as little deficits as possible.

## 2021-10-30 LAB — GLUCOSE, CAPILLARY: Glucose-Capillary: 190 mg/dL — ABNORMAL HIGH (ref 70–99)

## 2021-10-30 LAB — CREATININE, SERUM
Creatinine, Ser: 0.86 mg/dL (ref 0.61–1.24)
GFR, Estimated: >60 mL/min (ref >60)

## 2021-10-30 NOTE — Progress Notes (Signed)
Occupational Therapy Session Note  Patient Details  Name: Gerald Bauer MRN: [id number] Date of Birth: 08/13/1945  Today's Date: 10/30/2021 OT Individual Time: 1020-1130 OT Individual Time Calculation (min): 70 min    Short Term Goals: Week 2:  OT Short Term Goal 1 (Week 2): STG = LTG 2/2 ELOS  Skilled Therapeutic Interventions/Progress Updates:  Skilled OT intervention completed with focus on d/c planning, ADLs. Pt received sitting in w/c, with increased verbal responses this session. Did indicate pain on R scapular/glenohumeral region with nurse made aware for potential need for lidocaine patch as pt reports tylenol has not helped.  Pt agreeable to shower. Completed all transfers sit > stand, ambulatory with RW with R hand splint, at supervision/CGA level. Ambulated to tub bench, doffed all clothing with supervision. Bathed at supervision level including in stance for periarea. Cues needed on occasion for safety and having therapist present at pt's side for standing. Stand pivot with RW to w/c, then dressed UB and LB including socks and shoes with supervision and occasional CGA to hold shirt up during donning of brief over hips. Able to maintain stance with CGA using sink for balance, to donn deodorant with min A.   Education provided about discharge home, DME ready, safety precautions, ways to ask for help, etc. Pt was able to complete the BIMS during d/c assessment whereas pt could not do on eval!   Pt remained seated in w/c, with belt alarm on, R half lap tray donned for hemi positioning and all immediate needs met at end of session.   Therapy Documentation Precautions:  Precautions Precautions: Fall, Other (comment) Precaution Comments: R hemi, R inattention, aphasia, dysphagia Restrictions Weight Bearing Restrictions: No    Therapy/Group: Individual Therapy  [name] E Tandra Rosado, MS, OTR/L  10/30/2021, 8:02 AM

## 2021-10-30 NOTE — Plan of Care (Signed)
  Problem: RH Swallowing Goal: LTG Patient will consume least restrictive diet using compensatory strategies with assistance (SLP) Description: LTG:  Patient will consume least restrictive diet using compensatory strategies with assistance (SLP) Outcome: Completed/Met Flowsheets (Taken 10/10/2021 1800 by Charna Elizabeth T, CCC-SLP) LTG: Pt Patient will consume least restrictive diet using compensatory strategies with assistance of (SLP): Minimal Assistance - Patient > 75% Goal: LTG Pt will demonstrate functional change in swallow as evidenced by bedside/clinical objective assessment (SLP) Description: LTG: Patient will demonstrate functional change in swallow as evidenced by bedside/clinical objective assessment (SLP) Outcome: Completed/Met Flowsheets (Taken 10/10/2021 1800 by Charna Elizabeth T, CCC-SLP) LTG: Patient will demonstrate functional change in swallow as evidenced by bedside/clinical objective assessment: Oropharyngeal swallow   Problem: RH Comprehension Communication Goal: LTG Patient will comprehend basic/complex auditory (SLP) Description: LTG: Patient will comprehend basic/complex auditory information with cues (SLP). Outcome: Completed/Met Flowsheets (Taken 10/10/2021 1800 by Charna Elizabeth T, CCC-SLP) LTG: Patient will comprehend: Basic auditory information LTG: Patient will comprehend auditory information with cueing (SLP): Supervision   Problem: RH Expression Communication Goal: LTG Patient will express needs/wants via multi-modal(SLP) Description: LTG:  Patient will express needs/wants via multi-modal communication (gestures/written, etc) with cues (SLP) Outcome: Completed/Met Flowsheets (Taken 10/10/2021 1800 by Charna Elizabeth T, CCC-SLP) LTG: Patient will express needs/wants via multimodal communication (gestures/written, etc) with cueing (SLP): Moderate Assistance - Patient 50 - 74% Goal: LTG Patient will verbally express basic/complex  needs(SLP) Description: LTG:  Patient will verbally express basic/complex needs, wants or ideas with cues  (SLP) Outcome: Completed/Met Flowsheets (Taken 10/10/2021 1800 by Charna Elizabeth T, CCC-SLP) LTG: Patient will verbally express basic/complex needs, wants or ideas (SLP): Maximal Assistance - Patient 25 - 49%

## 2021-10-30 NOTE — Progress Notes (Signed)
Inpatient Rehabilitation Care Coordinator Discharge Note   Patient Details  Name: Gerald Bauer MRN: [id number] Date of Birth: 1945-08-28   Discharge location: Home  Length of Stay: 22 Days  Discharge activity level: [place]  Home/community participation: Son and DIL  Patient response PN:TIRWER Literacy - How often do you need to have someone help you when you read instructions, pamphlets, or other written material from your doctor or pharmacy?: Often  Patient response XV:QMGQQP Isolation - How often do you feel lonely or isolated from those around you?: Never  Services provided included: MD, RD, PT, OT, SLP, RN, CM, TR, Pharmacy, SW  Financial Services:  Financial Services Utilized: [place] offered to/list presented to: son and DIL  Follow-up services arranged:  Outpatient    Outpatient Servicies: OP at [facility]      Patient response to transportation need: Is the patient able to respond to transportation needs?: Yes In the past 12 months, has lack of transportation kept you from medical appointments or from getting medications?: No In the past 12 months, has lack of transportation kept you from meetings, work, or from getting things needed for daily living?: No    Comments (or additional information):  Patient/Family verbalized understanding of follow-up arrangements:  Yes  Individual responsible for coordination of the follow-up plan: Darren [phone]  Confirmed correct DME delivered: Dyanne Iha 10/30/2021    Dyanne Iha

## 2021-10-30 NOTE — Progress Notes (Signed)
Occupational Therapy Discharge Summary  Patient Details  Name: Gerald Bauer MRN: [id number] Date of Birth: October 22, 1945   Patient has met 9 of 9 long term goals due to improved activity tolerance, improved balance, functional use of  RIGHT upper and RIGHT lower extremity, improved awareness, and improved coordination.  Patient to discharge at overall Supervision level.  Patient's care partner is independent to provide the necessary physical and cognitive assistance at discharge and has completed hands on training during multiple OT sessions to address safety and potential assist for higher level ADL tasks at home.  Reasons goals not met: N/A  Recommendation:  Patient will benefit from ongoing skilled OT services in outpatient setting to continue to advance functional skills in the area of BADL, iADL, and restore functional use of R dominant UE .  Equipment: TTB  Reasons for discharge: treatment goals met  Patient/family agrees with progress made and goals achieved: Yes  OT Discharge Precautions/Restrictions  Precautions Precautions: Fall;Other (comment) Precaution Comments: R hemi, R inattention, aphasia, dysphagia Restrictions Weight Bearing Restrictions: No ADL ADL Eating: Set up Where Assessed-Eating: Wheelchair Grooming: Setup Where Assessed-Grooming: Sitting at sink Upper Body Bathing: Supervision/safety Where Assessed-Upper Body Bathing: Shower Lower Body Bathing: Supervision/safety Where Assessed-Lower Body Bathing: Shower Upper Body Dressing: Supervision/safety Where Assessed-Upper Body Dressing: Sitting at sink Lower Body Dressing: Supervision/safety Where Assessed-Lower Body Dressing: Sitting at sink, Standing at sink Toileting: Supervision/safety Where Assessed-Toileting: Glass blower/designer: Close supervision Armed forces technical officer Method: Counselling psychologist: Energy manager: Close supervison Clinical cytogeneticist Method:  Ambulating, Sit pivot Tub/Shower Equipment: Facilities manager: Close supervision Social research officer, government Method: Heritage manager: Gaffer Baseline Vision/History: 1 Wears glasses (PRN) Patient Visual Report: Other (comment) Vision Assessment?: Vision impaired- to be further tested in functional context Eye Alignment: Within Functional Limits Ocular Range of Motion: Within Functional Limits Saccades: Decreased speed of saccadic movement Visual Fields: Right visual field deficit Depth Perception: Overshoots Perception  Perception: Impaired Inattention/Neglect: Does not attend to right visual field;Does not attend to right side of body Praxis Praxis: Intact Cognition Cognition Overall Cognitive Status: Impaired/Different from baseline Arousal/Alertness: Awake/alert Orientation Level: Nonverbal/unable to assess Memory: Appears intact Awareness: Impaired Problem Solving: Impaired Safety/Judgment: Impaired Comments: Difficult to assess 2/2 aphasia and communication barriers Brief Interview for Mental Status (BIMS) Repetition of Three Words (First Attempt): 3 (able to verbalize with broken speech and or white board for communication for assessment however pt able to complete this time!) Temporal Orientation: Year: Correct Temporal Orientation: Month: Accurate within 5 days Temporal Orientation: Day: Incorrect Recall: "Sock": Yes, after cueing ("something to wear") Recall: "Blue": Yes, no cue required Recall: "Bed": Yes, no cue required BIMS Summary Score: 13 Sensation Sensation Light Touch: Appears Intact Hot/Cold: Appears Intact Proprioception: Impaired by gross assessment Proprioception Impaired Details: Impaired RUE;Impaired RLE Stereognosis: Not tested Coordination Gross Motor Movements are Fluid and Coordinated: No Fine Motor Movements are Fluid and Coordinated: No Coordination and Movement Description: mild  R hemi with R inattention, generalized weakness/deconditioning, impaired safety awareness Finger Nose Finger Test: WFL on LUE, slow and dysmetric on RUE Heel Shin Test: slightly slower on RLE but still [facility] Motor  Motor Motor: Hemiplegia Motor - Skilled Clinical Observations: mild R hemi with R inattention, generalized weakness/deconditioning, impaired safety awareness Mobility  Bed Mobility Bed Mobility: Rolling Right;Rolling Left Rolling Right: Independent with assistive device Rolling Left: Independent with assistive device Supine to Sit: Independent with assistive device Sit to Supine: Independent with  assistive device Transfers Sit to Stand: Supervision/Verbal cueing Stand to Sit: Supervision/Verbal cueing  Trunk/Postural Assessment  Cervical Assessment Cervical Assessment: Within Functional Limits Thoracic Assessment Thoracic Assessment: Exceptions to [facility] (thoracic rounding) Lumbar Assessment Lumbar Assessment: Exceptions to [facility] (posterior pelvic tilt) Postural Control Postural Control: Deficits on evaluation Protective Responses: slightly delayed due to R inattention  Balance Balance Balance Assessed: Yes Static Sitting Balance Static Sitting - Balance Support: Feet supported;No upper extremity supported Static Sitting - Level of Assistance: 6: Modified independent (Device/Increase time) Dynamic Sitting Balance Dynamic Sitting - Balance Support: Feet supported;No upper extremity supported Dynamic Sitting - Level of Assistance: 6: Modified independent (Device/Increase time) Static Standing Balance Static Standing - Balance Support: Bilateral upper extremity supported;During functional activity (RW) Static Standing - Level of Assistance: 5: Stand by assistance (supervision) Dynamic Standing Balance Dynamic Standing - Balance Support: Bilateral upper extremity supported;During functional activity (RW) Dynamic Standing - Level of Assistance: 5: Stand by assistance  (supervision) Dynamic Standing - Comments: with transfers mainly Extremity/Trunk Assessment RUE AROM (degrees) Overall AROM Right Upper Extremity: Deficits RUE Overall AROM Comments: Limited to <110 degrees shoulder flexion Right Composite Finger Flexion: 75% RUE PROM (degrees) Overall PROM Right Upper Extremity: Within functional limits for tasks performed Right Composite Finger Extension: 75% RUE Strength RUE Overall Strength: Deficits Right Shoulder Flexion: 3-/5 Right Shoulder Extension: 4+/5 Right Elbow Flexion: 4+/5 Right Elbow Extension: 5/5 Right Wrist Flexion: 4+/5 Right Wrist Extension: 4+/5 Right Hand Gross Grasp: Impaired RUE Tone RUE Tone: Within Functional Limits LUE Assessment LUE Assessment: Within Functional Limits   Caleigha Zale E Datra Clary, MS, OTR/L  10/30/2021, 10:43 AM

## 2021-10-30 NOTE — Progress Notes (Signed)
Speech Language Pathology Discharge Summary  Patient Details  Name: Gerald Bauer MRN: [id number] Date of Birth: [birth year]  Today's Date: 10/30/2021 SLP Individual Time: 0725-0815 SLP Individual Time Calculation (min): 50 min   Skilled Therapeutic Interventions:   S: Pt seen this date for skilled ST intervention targeting communication goals outlined in care plan. Pt received awake/alert and lying semi-reclined in bed. Verbalized "hi" and "good morning" upon SLP arrival. No c/o pain and no immediate needs when given Min A verbal cues. Agreeable to ST intervention in speech office. Transferred from bed to w/c with CGA. Transported to speech office with Total A for time management.   O: Pt completed the following post-tx assessments and achieved the following scores:  CLQT Visual Confrontational Naming subtest score: 8/10 Bedside WAB Auditory Comprehension Yes/No Questions subtest score: 10/10 Bedside WAB Verbal Repetition subtest score: 5/10 Sentence Completion: 75% accuracy without cues WAB Letter ID: 6/6 Naming body parts: 100%  Wrote name with 100% accuracy; copied words with 100% given Mod A for error awareness and correcting 1-step concrete directives - 100% accuracy 2-step concrete directives - 100% accuracy  3-step abstract directives - 100% accuracy  Declined PO trials this date.   A: Pt remains stimulable for skilled ST intervention and will benefit from ongoing ST in the next venue of care (OP). Recommend Dysphagia 3 textures and thin liquids, as well as 24/7 supervision and assistance at home. Son and DIL aware and verbalized understanding of all family education provided on 10/27/2021.   P: At the end of today's session, pt returned to room and left in w/c with all safety measures activated. Call bell reviewed and within reach and all immediate needs met. Will plan to discharge from [facility] intervention to prepare for upcoming hospital discharge.   Patient has met 4 of 4 long term  goals.  Patient to discharge at overall Supervision;Mod (Supervision for swallowing and auditory comprehension; Mod A for verbal expression) level.  Reasons goals not met: N/A   Clinical Impression/Discharge Summary:  At time of discharge, pt met 4 out of 4 long-term goals set during CIR admission. Pt has been upgraded to Dysphagia 3 textures with thin liquids given adherence to aspiration precautions. 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 Difficult to fully assess cognition given severity of linguistic impairment; however, does present with decreased safety awareness, diminished problem-solving, emergent awareness, and mild impulsivity with movement. Pt remains stimulable for skilled ST intervention and will continue to benefit from ST intervention at next venue of care (OP). Recommend 24/7 supervision + assistance, which pt's son is able to provide.    Care Partner:  Caregiver Able to Provide Assistance: Yes  Type of Caregiver Assistance: Physical;Cognitive  Recommendation:  24 hour supervision/assistance;Home Health SLP;Outpatient SLP  Rationale for SLP Follow Up: Maximize functional communication;Reduce caregiver burden;Maximize swallowing safety   Equipment: N/A   Reasons for discharge: Discharged from hospital   Patient/Family Agrees with Progress Made and Goals Achieved: Yes    Clete Kuch A Lezly Rumpf 10/30/2021, 12:26 PM

## 2021-10-30 NOTE — Progress Notes (Signed)
Physical Therapy Discharge Summary  Patient Details  Name: Gerald Bauer MRN: [id number] Date of Birth: 1945-10-04  Patient has met 9 of 9 long term goals due to improved activity tolerance, improved balance, improved postural control, increased strength, increased range of motion, ability to compensate for deficits, improved attention, improved awareness, and improved coordination. Patient to discharge at an ambulatory level Supervision using RW and R hand splint. Patient's care partner is independent to provide the necessary physical and cognitive assistance at discharge. Pt's family attended family education training on 8/4 and verbalized and demonstrated confidence to ensure safe discharge home.   All goals met   Recommendation:  Patient will benefit from ongoing skilled PT services in outpatient setting to continue to advance safe functional mobility, address ongoing impairments in transfers, generalized strengthening and endurance, dynamic standing balance/coordination, gait training, NMR, and to minimize fall risk.  Equipment: RW  Reasons for discharge: treatment goals met and discharge from hospital  Patient/family agrees with progress made and goals achieved: Yes  PT Discharge Precautions/Restrictions Precautions Precautions: Fall;Other (comment) Precaution Comments: R hemi, R inattention, aphasia, dysphagia Restrictions Weight Bearing Restrictions: No Pain Interference Pain Interference Pain Effect on Sleep: 1. Rarely or not at all Pain Interference with Therapy Activities: 3. Frequently (pointed to R hand to indicate location of pain) Pain Interference with Day-to-Day Activities: 1. Rarely or not at all Cognition Overall Cognitive Status: Impaired/Different from baseline Arousal/Alertness: Awake/alert Orientation Level: Oriented X4 Memory: Appears intact Awareness: Impaired Problem Solving: Impaired Safety/Judgment: Impaired Sensation Sensation Light Touch:  Appears Intact Proprioception: Impaired by gross assessment Coordination Gross Motor Movements are Fluid and Coordinated: No Fine Motor Movements are Fluid and Coordinated: No Coordination and Movement Description: mild R hemi with R inattention, generalized weakness/deconditioning, impaired safety awareness Finger Nose Finger Test: WFL on LUE, slow and dysmetric on RUE Heel Shin Test: slightly slower on RLE but still [facility] Motor  Motor Motor: Hemiplegia Motor - Skilled Clinical Observations: mild R hemi with R inattention, generalized weakness/deconditioning, impaired safety awareness  Mobility Bed Mobility Bed Mobility: Rolling Right;Rolling Left Rolling Right: Independent with assistive device Rolling Left: Independent with assistive device Supine to Sit: Independent with assistive device Sit to Supine: Independent with assistive device Transfers Transfers: Sit to Stand;Stand to Sit;Stand Pivot Transfers Sit to Stand: Supervision/Verbal cueing Stand to Sit: Supervision/Verbal cueing Stand Pivot Transfers: Supervision/Verbal cueing Stand Pivot Transfer Details: Verbal cues for precautions/safety;Verbal cues for safe use of DME/AE Stand Pivot Transfer Details (indicate cue type and reason): verbal cues for RW safety Transfer (Assistive device): Rolling walker Locomotion  Gait Ambulation: Yes Gait Assistance: Supervision/Verbal cueing Gait Distance (Feet): 150 Feet Assistive device: Rolling walker Gait Assistance Details: Verbal cues for gait pattern;Verbal cues for safe use of DME/AE Gait Assistance Details: intermittent cues for R attention and to increase R step length Gait Gait: Yes Gait Pattern: Impaired Gait Pattern: Poor foot clearance - right;Step-through pattern;Decreased step length - right;Decreased stride length;Narrow base of support Gait velocity: decreased Stairs / Additional Locomotion Stairs: Yes Stairs Assistance: Contact Guard/Touching assist Stair  Management Technique: One rail Right Number of Stairs: 12 Height of Stairs: 6 Ramp: Supervision/Verbal cueing (RW) Pick up small object from the floor assist level: Supervision/Verbal cueing Pick up small object from the floor assistive device: tissue box Wheelchair Mobility Wheelchair Mobility: No  Trunk/Postural Assessment  Cervical Assessment Cervical Assessment: Within Functional Limits Thoracic Assessment Thoracic Assessment: Exceptions to [facility] (thoracic rounding) Lumbar Assessment Lumbar Assessment: Exceptions to [facility] (posterior pelvic tilt) Postural Control Postural  Control: Deficits on evaluation Protective Responses: slightly delayed due to R inattention  Balance Balance Balance Assessed: Yes Static Sitting Balance Static Sitting - Balance Support: Feet supported;No upper extremity supported Static Sitting - Level of Assistance: 6: Modified independent (Device/Increase time) Dynamic Sitting Balance Dynamic Sitting - Balance Support: Feet supported;No upper extremity supported Dynamic Sitting - Level of Assistance: 6: Modified independent (Device/Increase time) Static Standing Balance Static Standing - Balance Support: Bilateral upper extremity supported;During functional activity (RW) Static Standing - Level of Assistance: 5: Stand by assistance (supervision) Dynamic Standing Balance Dynamic Standing - Balance Support: Bilateral upper extremity supported;During functional activity (RW) Dynamic Standing - Level of Assistance: 5: Stand by assistance (supervision) Dynamic Standing - Comments: with transfers mainly Extremity Assessment  RLE Assessment RLE Assessment: Exceptions to [facility] General Strength Comments: in sitting RLE Strength Right Hip Flexion: 4/5 Right Hip ABduction: 4/5 Right Hip ADduction: 4/5 Right Knee Flexion: 4/5 Right Knee Extension: 4/5 Right Ankle Dorsiflexion: 4/5 Right Ankle Plantar Flexion: 4/5 LLE Assessment LLE Assessment: Within Functional  Limits General Strength Comments: grossly 5/5 in sitting  [place] PT, DPT  10/30/2021, 7:15 AM

## 2021-10-30 NOTE — Progress Notes (Signed)
Physical Therapy Session Note  Patient Details  Name: Gerald Bauer MRN: [id number] Date of Birth: October 23, 1945  Today's Date: 10/30/2021 PT Individual Time: [phone] and 1415-1455 PT Individual Time Calculation (min): 26 min and 40 min  Short Term Goals: Week 2:  PT Short Term Goal 1 (Week 2): Pt will ambulate 129ft with LRAD and CGA PT Short Term Goal 1 - Progress (Week 2): Met PT Short Term Goal 2 (Week 2): Pt will perform simulated car transfer with LRAD and CGA PT Short Term Goal 2 - Progress (Week 2): Met PT Short Term Goal 3 (Week 2): pt will perform bed mobility with supervision from flat bed without bedrails PT Short Term Goal 3 - Progress (Week 2): Met Week 3:  PT Short Term Goal 1 (Week 3): STG=LTG due to LOS  Skilled Therapeutic Interventions/Progress Updates:   Treatment Session 1 Received pt sitting in WC, pt agreeable to PT treatment, and denied any pain during session - laboratory arrived to take blood. Session with emphasis on discharge planning, functional mobility/transfers, generalized strengthening and endurance, and gait training. Went through pain interference questionnaire and assessed sensation and MMT. Donned shoes with supervision and pt stood with RW and supervision and ambulated 185ft x 2 trials with RW and R hand splint with supervision (intermittent CGA to hold pants up) to/from dayroom. Worked on blocked practice sit<>stands without UE support x28 reps to fatigue with close supervision with emphasis on quad strength. Returned to room and concluded session with pt sitting in WC, needs within reach, and seatbelt alarm on.   Treatment Session 2 Received pt semi-reclined in bed asleep. Upon wakening, pt agreeable to PT treatment and denied any pain during session. Session with emphasis on functional mobility/transfers, generalized strengthening and endurance, dynamic standing balance/coordination, toileting, and gait training. Placed R hand splint on pt's new RW and  replaced rubber stoppers with plastic ones to slide easier across floors. Pt transferred semi-reclined<>sitting EOB mod I and donned shoes with set up assist. Pt performed all transfers with RW and R hand splint with supervision throughout session. Pt reported urge to toilet and ambulated in/out of bathroom with RW and CGA. Pt able to manage clothing with min A overall and continent of bladder. Stood at sink to wash hands with distant supervision then ambulated 168ft x 2 trials with RW and supervision to/from dayroom. Pt performed BUE/LE strengthening on Nustep at workload 5 increasing to 7 for 8 minutes for a total of 327 steps with emphasis on cardiovascular endurance and reciprocal movement training. Pt performed TUG with RW and supervision with average of 23 seconds. Educated pt on test results and significance indicating high fall risk and importance of using RW upon D/C - pt in agreement.  Trial 1: 25 seconds Trial 2: 21 seconds Trial 3: 23 seconds  Pt then performed alternating toe taps to cone for 1 minute x 2 trials with intermittent UE support and min A for balance with 1 posterior LOB onto mat. Returned to room and pt stood at sink and washed hands/brushed teeth with supervision then requested to return to bed. Sit<>supine mod I and scooted to [facility] independently. Concluded session with pt supine in bed, needs within reach, and bed alarm on.   Therapy Documentation Precautions:  Precautions Precautions: Fall, Other (comment) Precaution Comments: R hemi, R inattention, aphasia, dysphagia Restrictions Weight Bearing Restrictions: No  Therapy/Group: Individual Therapy Alfonse Alpers PT, DPT  10/30/2021, 7:16 AM

## 2021-10-30 NOTE — Progress Notes (Signed)
PROGRESS NOTE   Subjective/Complaints:  Pt up in chair. Seems to be in good spirits. Denies pain, indicates there are no problems  ROS: Limited due to language, only y/n answers  Objective:   No results found.  No results for input(s): "WBC", "HGB", "HCT", "PLT" in the last 72 hours.  Recent Labs    10/30/21 0919  CREATININE 0.86       Intake/Output Summary (Last 24 hours) at 10/30/2021 1449 Last data filed at 10/30/2021 1238 Gross per 24 hour  Intake 657 ml  Output --  Net 657 ml        Physical Exam: Vital Signs Blood pressure 122/81, pulse (!) 110, temperature 98.2 F (36.8 C), temperature source Oral, resp. rate 18, height '5\' 7"'$  (1.702 m), weight 74.5 kg, SpO2 99 %.     Constitutional: No distress . Vital signs reviewed. HEENT: NCAT, EOMI, oral membranes moist Neck: supple Cardiovascular: RRR without murmur. No JVD    Respiratory/Chest: CTA Bilaterally without wheezes or rales. Normal effort    GI/Abdomen: BS +, non-tender, non-distended Ext: no clubbing, cyanosis, or edema Psych: pleasant and cooperative  Neurologic: Cranial nerves II through XII intact, motor strength is 5/5 in left deltoid, bicep, tricep, grip, hip flexor, knee extensors, ankle dorsiflexor and plantar flexor RUE 3/5 RLE 4/5 Sensory exam normal sensation to light touch and proprioception in bilateral upper and lower extremities Expressive aphasia ongoing, uses head nods primarily for me  Musc: right knee/shoulder discomfort with ROM, palpation Right hand with decreased pain. Shook my hand       Assessment/Plan: 1. Functional deficits which require 3+ hours per day of interdisciplinary therapy in a comprehensive inpatient rehab setting. Physiatrist is providing close team supervision and 24 hour management of active medical problems listed below. Physiatrist and rehab team continue to assess barriers to discharge/monitor patient  progress toward functional and medical goals  Care Tool:  Bathing    Body parts bathed by patient: Right arm, Left arm, Chest, Abdomen, Front perineal area, Buttocks, Right upper leg, Left upper leg, Right lower leg, Left lower leg, Face   Body parts bathed by helper: Left arm, Front perineal area, Buttocks, Right lower leg, Left lower leg     Bathing assist Assist Level: Supervision/Verbal cueing     Upper Body Dressing/Undressing Upper body dressing   What is the patient wearing?: Pull over shirt    Upper body assist Assist Level: Supervision/Verbal cueing    Lower Body Dressing/Undressing Lower body dressing      What is the patient wearing?: Incontinence brief, Pants     Lower body assist Assist for lower body dressing: Supervision/Verbal cueing     Toileting Toileting Toileting Activity did not occur (Clothing management and hygiene only): Refused  Toileting assist Assist for toileting: Supervision/Verbal cueing Assistive Device Comment: walker   Transfers Chair/bed transfer  Transfers assist     Chair/bed transfer assist level: Supervision/Verbal cueing     Locomotion Ambulation   Ambulation assist      Assist level: Supervision/Verbal cueing Assistive device: Walker-rolling Max distance: 156f   Walk 10 feet activity   Assist     Assist level: Supervision/Verbal cueing Assistive device:  Walker-rolling   Walk 50 feet activity   Assist Walk 50 feet with 2 turns activity did not occur: Safety/medical concerns  Assist level: Supervision/Verbal cueing Assistive device: Walker-rolling    Walk 150 feet activity   Assist Walk 150 feet activity did not occur: Safety/medical concerns  Assist level: Supervision/Verbal cueing Assistive device: Walker-rolling    Walk 10 feet on uneven surface  activity   Assist Walk 10 feet on uneven surfaces activity did not occur: Safety/medical concerns   Assist level: Supervision/Verbal  cueing Assistive device: Walker-rolling, Other (comment) (R hand splint)   Wheelchair     Assist Is the patient using a wheelchair?: No Type of Wheelchair: Manual Wheelchair activity did not occur: N/A  Wheelchair assist level: Minimal Assistance - Patient > 75% Max wheelchair distance: 500 feet or greater    Wheelchair 50 feet with 2 turns activity    Assist    Wheelchair 50 feet with 2 turns activity did not occur: N/A   Assist Level: Minimal Assistance - Patient > 75%   Wheelchair 150 feet activity     Assist  Wheelchair 150 feet activity did not occur: N/A   Assist Level: Minimal Assistance - Patient > 75%   Blood pressure 122/81, pulse (!) 110, temperature 98.2 F (36.8 C), temperature source Oral, resp. rate 18, height '5\' 7"'$  (1.702 m), weight 74.5 kg, SpO2 99 %.  Medical Problem List and Plan: 1. 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  Impaired mobility, ambulating 270 feet: d/c lovenox. Continue Aspirin 81 mg daily and Plavix 75 mg daily to continue indefinitely 3. Shoulder arthritis: voltaren gel ordered, changed to prn. Discussed with patient. Discussed with therapy that I do not usually see spikes in blood pressure with topical voltaren gel, we can try again today and assess for these changes in vitals. Tylenol as needed. Provided pain relief journal  7/31- denied need for pain meds this AM- con't regimen  8/7- no pain today- con't regimen 4. Mood/Behavior/Sleep: Provide emotional support             -antipsychotic agents: N/A 5. Neuropsych/cognition: This patient is not capable of making decisions on his own behalf. 6. Skin/Wound Care: Routine skin  checks 7. Fluids/Electrolytes/Nutrition: Routine in and outs with follow-up chemistries 8.  Hypertension.  Continue Norvasc 5 mg daily.  Monitor with increased mobility  8/7- BP controlled- con't regimen 9.  Diabetes mellitus.  Hemoglobin A1c 7.1. d/c ISS. Patient maintained on Increase metformin to 1,000 mg BID  Decrease CBG checks to AC BID  Discussed minimizing foods with added sugar  CBG (last 3)  Recent Labs    10/28/21 0637 10/29/21 0613 10/30/21 0621  GLUCAP 117* 129* 190*     8/3 good control, continue metformin  8/7- fair control 10.  Hyperlipidemia.  Intolerant to statin. Continue Repatha 140 mg subcutaneous every 2 weeks prior to admission. 11.  History of prostate cancer/prostatectomy 2024.  Follow-up outpatient 12. Hypoalbuminemia: recommended choosing foods high in protein and low in added sugar.  13. Constipation: increase magnesium gluconate to '500mg'$  HS. Provide dietary education.  Resolved with sorbitol. D/c senna-docusate  -stools still a little soft    LBM 8/7 14. Overweight BMI 25.72: provide dietary education. Provided daughter in law with list of foods that can assist with weight loss.  15. Expressive aphasia: continue SLP 16. 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 Urinary retention: continue flomax HS 62. Fall yesterday: advised that flomax can cause falls by decreasing blood pressure, discussed this is why we give Flomax at night 20. Impaired problem solving and motor planning: continue SLP and OT 21. Tachycardia: Increase magnesium gluconate to '500mg'$  HS 22. Insomnia  - order to minimize disturbances at night 23. R hand hand, likely from increased  use with therapy  -Voltaren gel---seems fairly comfortable today 8/7    LOS: 21 days A FACE TO [place] T Dory [place] 10/30/2021, 2:49 PM

## 2021-10-31 LAB — GLUCOSE, CAPILLARY: Glucose-Capillary: 132 mg/dL — ABNORMAL HIGH (ref 70–99)

## 2021-10-31 MED ORDER — ACETAMINOPHEN 325 MG PO TABS: 650.0000 mg | ORAL_TABLET | ORAL | Status: AC | PRN

## 2021-10-31 MED ORDER — MAGNESIUM GLUCONATE 500 MG PO TABS: 500.0000 mg | ORAL_TABLET | Freq: Every day | ORAL | 0 refills | Status: DC

## 2021-10-31 MED ORDER — METFORMIN HCL 1000 MG PO TABS: 1000.0000 mg | ORAL_TABLET | Freq: Two times a day (BID) | ORAL | 0 refills | Status: DC

## 2021-10-31 MED ORDER — TAMSULOSIN HCL 0.4 MG PO CAPS: 0.4000 mg | ORAL_CAPSULE | Freq: Every day | ORAL | 0 refills | Status: DC

## 2021-10-31 NOTE — Progress Notes (Signed)
PA discussed discharge instructions with family/patient. Pt/family in agreement. Pt left per wheelchair with belongings to private vehicle. No complications noted. Sheela Stack, LPN

## 2021-10-31 NOTE — Progress Notes (Signed)
Inpatient Rehabilitation Discharge Medication Review by a Pharmacist  A complete drug regimen review was completed for this patient to identify any potential clinically significant medication issues.  High Risk Drug Classes Is patient taking? Indication by Medication  Antipsychotic No   Anticoagulant No   Antibiotic No   Opioid No   Antiplatelet Yes Aspirin, Plavix - CVA prophylaxis  Hypoglycemics/insulin Yes Metformin - T2DM  Vasoactive Medication Yes Norvasc - HTN  Chemotherapy No   Other Yes Vitamin D, Vitamin B-12, magnesium - supplement Voltarin gel - arthritis Flomax - urinary retention Repatha - HLD Albuterol - COPD     Type of Medication Issue Identified Description of Issue Recommendation(s)  Drug Interaction(s) (clinically significant)     Duplicate Therapy     Allergy     No Medication Administration End Date     Incorrect Dose     Additional Drug Therapy Needed     Significant med changes from prior encounter (inform family/care partners about these prior to discharge).    Other       Clinically significant medication issues were identified that warrant physician communication and completion of prescribed/recommended actions by midnight of the next day:  No   Time spent performing this drug regimen review (minutes):  [place], PharmD, BCPS Clinical Pharmacist Clinical phone for 10/31/2021 until 3p is [phone] 10/31/2021 9:00 AM

## 2021-10-31 NOTE — Progress Notes (Signed)
PROGRESS NOTE   Subjective/Complaints:  Stable for d/c today  ROS: Limited due to language, only y/n answers  Objective:   No results found.  No results for input(s): "WBC", "HGB", "HCT", "PLT" in the last 72 hours.  Recent Labs    10/30/21 0919  CREATININE 0.86       Intake/Output Summary (Last 24 hours) at 10/31/2021 1012 Last data filed at 10/31/2021 0739 Gross per 24 hour  Intake 720 ml  Output --  Net 720 ml        Physical Exam: Vital Signs Blood pressure 130/71, pulse 83, temperature 98 F (36.7 C), temperature source Oral, resp. rate 18, height '5\' 7"'$  (1.702 m), weight 71.6 kg, SpO2 96 %. Gen: no distress, normal appearing HEENT: oral mucosa pink and moist, NCAT Cardio: Reg rate Chest: normal effort, normal rate of breathing Abd: soft, non-distended Ext: no edema Psych: pleasant, normal affect Skin: intact  Neurologic: Cranial nerves II through XII intact, motor strength is 5/5 in left deltoid, bicep, tricep, grip, hip flexor, knee extensors, ankle dorsiflexor and plantar flexor RUE 3/5 RLE 4/5 Sensory exam normal sensation to light touch and proprioception in bilateral upper and lower extremities Expressive aphasia ongoing, uses head nods primarily for me  Musc: right knee/shoulder discomfort with ROM, palpation Right hand with decreased pain. Shook my hand       Assessment/Plan: 1. Functional deficits which require 3+ hours per day of interdisciplinary therapy in a comprehensive inpatient rehab setting. Physiatrist is providing close team supervision and 24 hour management of active medical problems listed below. Physiatrist and rehab team continue to assess barriers to discharge/monitor patient progress toward functional and medical goals  Care Tool:  Bathing    Body parts bathed by patient: Right arm, Left arm, Chest, Abdomen, Front perineal area, Buttocks, Right upper leg, Left upper leg,  Right lower leg, Left lower leg, Face   Body parts bathed by helper: Left arm, Front perineal area, Buttocks, Right lower leg, Left lower leg     Bathing assist Assist Level: Supervision/Verbal cueing     Upper Body Dressing/Undressing Upper body dressing   What is the patient wearing?: Pull over shirt    Upper body assist Assist Level: Supervision/Verbal cueing    Lower Body Dressing/Undressing Lower body dressing      What is the patient wearing?: Incontinence brief, Pants     Lower body assist Assist for lower body dressing: Supervision/Verbal cueing     Toileting Toileting Toileting Activity did not occur (Clothing management and hygiene only): Refused  Toileting assist Assist for toileting: Supervision/Verbal cueing Assistive Device Comment: walker   Transfers Chair/bed transfer  Transfers assist     Chair/bed transfer assist level: Supervision/Verbal cueing     Locomotion Ambulation   Ambulation assist      Assist level: Supervision/Verbal cueing Assistive device: Walker-rolling Max distance: 153f   Walk 10 feet activity   Assist     Assist level: Supervision/Verbal cueing Assistive device: Walker-rolling   Walk 50 feet activity   Assist Walk 50 feet with 2 turns activity did not occur: Safety/medical concerns  Assist level: Supervision/Verbal cueing Assistive device: Walker-rolling    Walk 150  feet activity   Assist Walk 150 feet activity did not occur: Safety/medical concerns  Assist level: Supervision/Verbal cueing Assistive device: Walker-rolling    Walk 10 feet on uneven surface  activity   Assist Walk 10 feet on uneven surfaces activity did not occur: Safety/medical concerns   Assist level: Supervision/Verbal cueing Assistive device: Walker-rolling, Other (comment) (R hand splint)   Wheelchair     Assist Is the patient using a wheelchair?: No Type of Wheelchair: Manual Wheelchair activity did not occur:  N/A  Wheelchair assist level: Minimal Assistance - Patient > 75% Max wheelchair distance: 500 feet or greater    Wheelchair 50 feet with 2 turns activity    Assist    Wheelchair 50 feet with 2 turns activity did not occur: N/A   Assist Level: Minimal Assistance - Patient > 75%   Wheelchair 150 feet activity     Assist  Wheelchair 150 feet activity did not occur: N/A   Assist Level: Minimal Assistance - Patient > 75%   Blood pressure 130/71, pulse 83, temperature 98 F (36.7 C), temperature source Oral, resp. rate 18, height '5\' 7"'$  (1.702 m), weight 71.6 kg, SpO2 96 %.  Medical Problem List and Plan: 1. 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  Impaired mobility, ambulating 270 feet: d/c lovenox. Continue Aspirin 81 mg daily and Plavix 75 mg daily to continue indefinitely 3. Shoulder arthritis: voltaren gel ordered, changed to prn. Discussed with patient. Discussed with therapy that I do not usually see spikes in blood pressure with topical voltaren gel, we can try again today and assess for these changes in vitals. Tylenol as needed. Provided pain relief journal  7/31- denied need for pain meds this AM- con't regimen  8/7- no pain today- con't regimen 4. Mood/Behavior/Sleep: Provide emotional support             -antipsychotic agents: N/A 5. Neuropsych/cognition: This patient is not capable of making decisions on his own behalf. 6. Skin/Wound Care: Routine skin checks 7. Fluids/Electrolytes/Nutrition: Routine in and outs with follow-up chemistries 8.  Hypertension.  Continue Norvasc 5 mg daily.  Monitor with increased mobility  8/7- BP controlled- con't regimen 9.  Diabetes mellitus.  Hemoglobin A1c 7.1. d/c ISS. Patient  maintained on Increase metformin to 1,000 mg BID  Decrease CBG checks to AC BID  Discussed minimizing foods with added sugar  CBG (last 3)  Recent Labs    10/29/21 0613 10/30/21 0621 10/31/21 0612  GLUCAP 129* 190* 132*     8/3 good control, continue metformin  8/7- fair control 10.  Hyperlipidemia.  Intolerant to statin. Continue Repatha 140 mg subcutaneous every 2 weeks prior to admission. 11.  History of prostate cancer/prostatectomy 2024.  Follow-up outpatient 12. Hypoalbuminemia: recommended choosing foods high in protein and low in added sugar.  13. Constipation: increase magnesium gluconate to '500mg'$  HS. Provide dietary education. Resolved with sorbitol. D/c senna-docusate  -stools still a little soft    LBM 8/7 14. Overweight BMI 25.72: provide dietary education. Provided daughter in law with list of foods that can assist with weight loss.  15. Expressive aphasia: continue SLP 16. 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 Urinary retention: continue flomax HS 76. Fall yesterday: advised that flomax can cause falls by decreasing blood pressure, discussed this is why we give Flomax at night 20. Impaired problem solving and motor planning: continue SLP and OT 21. Tachycardia: Increase magnesium gluconate to '500mg'$  HS 22. Insomnia -continue magnesium gluconate 23. 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

## 2021-11-06 ENCOUNTER — Ambulatory Visit: Payer: Medicare Other | Admitting: Speech Pathology

## 2021-11-06 ENCOUNTER — Ambulatory Visit: Payer: Medicare Other | Attending: Physical Medicine and Rehabilitation

## 2021-11-06 ENCOUNTER — Encounter: Payer: Self-pay | Admitting: Speech Pathology

## 2021-11-06 DIAGNOSIS — I63512 Cerebral infarction due to unspecified occlusion or stenosis of left middle cerebral artery: Secondary | ICD-10-CM | POA: Insufficient documentation

## 2021-11-06 DIAGNOSIS — R1312 Dysphagia, oropharyngeal phase: Secondary | ICD-10-CM | POA: Insufficient documentation

## 2021-11-06 DIAGNOSIS — R482 Apraxia: Secondary | ICD-10-CM | POA: Diagnosis present

## 2021-11-06 DIAGNOSIS — R4701 Aphasia: Secondary | ICD-10-CM | POA: Insufficient documentation

## 2021-11-06 DIAGNOSIS — R41841 Cognitive communication deficit: Secondary | ICD-10-CM | POA: Diagnosis present

## 2021-11-06 DIAGNOSIS — R278 Other lack of coordination: Secondary | ICD-10-CM | POA: Diagnosis present

## 2021-11-06 DIAGNOSIS — M6281 Muscle weakness (generalized): Secondary | ICD-10-CM | POA: Insufficient documentation

## 2021-11-06 NOTE — Therapy (Unsigned)
OUTPATIENT SPEECH LANGUAGE PATHOLOGY APHASIA EVALUATION   Patient Name: Gerald Bauer MRN: [id number] DOB:Dec 16, 1945, 76 y.o., male Today's Date: 11/06/2021  PCP: [name], PA-C REFERRING PROVIDER: Reesa Chew, PA-C   End of Session - 11/06/21 1409     Visit Number 1    Number of Visits 25    Date for SLP Re-Evaluation 02/04/22    SLP Start Time 86    SLP Stop Time  1400    SLP Time Calculation (min) 60 min    Activity Tolerance Patient tolerated treatment well             Past Medical History:  Diagnosis Date   Acid reflux    Arthritis    hands   Carotid atherosclerosis    Diabetes mellitus without complication ([place])    Hyperlipemia    Hypertension    Prostate cancer ([place])    history   Tachycardia    Past Surgical History:  Procedure Laterality Date   Dendron  2013   PROSTATECTOMY  2012   Patient Active Problem List   Diagnosis Date Noted   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   Stroke (cerebrum) ([place]) 10/06/2021   Aphasia 10/05/2021   Myalgia due to statin 02/21/2021   Type 2 diabetes mellitus without complication, without long-term current use of insulin ([place]) 02/21/2021   Aortic atherosclerosis ([place]) 04/27/2020   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   Left foot pain 01/10/2017   Tinea pedis of both feet 01/10/2017   Onychomycosis 01/10/2017   Needs flu shot 01/10/2017   Onychomycosis of multiple toenails with type 2 diabetes mellitus ([place]) 07/19/2016   SOB (shortness of breath) 07/14/2016   Carotid stenosis 03/13/2016   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 07/11/2015   Medication monitoring encounter 05/04/2015   Ganglion cyst 02/23/2015   Joint pain in fingers of right hand 02/23/2015   Hyperlipidemia associated with type 2 diabetes mellitus ([place])    Hypertension associated with type 2 diabetes mellitus ([place])    Personal history of prostate cancer    Carotid atherosclerosis    Fatigue  08/20/2013   GERD (gastroesophageal reflux disease) 08/20/2013    ONSET DATE: 10/05/2021   REFERRING DIAG: Cerebral infarction due to unspecified occlusion of left middle cerebral artery  THERAPY DIAG:  Aphasia  Verbal apraxia  Cognitive communication deficit  Dysphagia, oropharyngeal phase  Rationale for Evaluation and Treatment Rehabilitation  SUBJECTIVE:   SUBJECTIVE STATEMENT: "I want..." (groping movements) Pt accompanied by: family member  PERTINENT HISTORY: Pt  is a 76 year old male with history of left-sided carotid stenosis, non-insulin-dependent diabetes mellitus type 2, PAD, GERD, mild COPD, history of tobacco use, hyperlipidemia, hypertension, who presented 10/05/21 to [facility] ED with facial droop, expressive aphasia, and R sided weakness and found to have left MCA CVA. He was admitted to [facility] 7/17-10/31/21. Advanced to dysphagia 3/thin liquids by cup by time of d/c from CIR. 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 Difficult to fully assess cognition given severity of linguistic impairment; however, does present with decreased safety awareness, diminished problem-solving, emergent awareness, and mild impulsivity with movement."   DIAGNOSTIC FINDINGS: MBS 10/19/21: "Pt presents with overall mild oropharyngeal dysphagia. Oral phase c/b piecemeal deglutition, decreased mastication, decreased bolus cohesion, premature spillage, and weak lingual manipulation. Pharyngeal phase c/b reduced pharyngeal peristalsis + reduced BOT approximation to PPW, which results in mild to moderate  vallecular and pyriform sinuses residuals (vallecula > pyriform sinuses for solids, pyriform sinuses > vallecula for liquids). No penetration nor aspiration appreciated with thin liquid via cup, Dysphagia 1 (puree), Dysphagia 2 (chopped), Dysphagia 3 (mechanical soft), or with 13 mm Barium tablet placed in puree.  Once instance of sensed aspiration with intake of large bolus of thin liquid via straw due to swallow initiation delay to pyriform sinuses and mistiming of swallow-breath cycle; despite strong reflexive cough aspirant was not fully cleared from trachea. Pt's level of swallow initiation was noted to vary with liquid consumption (over base of epiglottis and at pyriform sinuses) with level of swallow initiation for solids to be consistent at the vallecula. Pharyngeal stasis was partially cleared with reflexive secondary swallow." MRI brain 10/05/21: 1. 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 No associated hemorrhage or significant regional mass effect. 2. Loss of normal flow voids throughout the left ICA and MCA, consistent with slow flow and/or occlusion. 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 3. Underlying mild chronic microvascular ischemic disease. Tiny remote left ACA distribution infarct.   PAIN:  Are you having pain? Yes: NPRS scale: 5/10 Pain location: right hand   FALLS: Has patient fallen in last 6 months?  Yes, Number of falls: 2  LIVING ENVIRONMENT: Lives with: lives with their family, lives with their son, and daughter in law Lives in: House/apartment  PLOF:  Level of assistance: Independent with IADLs Employment: Retired   PATIENT GOALS talk better  OBJECTIVE:   COGNITION: Overall cognitive status: Difficulty to assess due to: Communication impairment Areas of impairment:  Will formally assess pending improvements in language/apraxia Functional deficits: impulsivity with movement, safety awareness  AUDITORY COMPREHENSION: Overall auditory comprehension: Impaired: moderately complex YES/NO questions: Impaired: complex  Following directions: Impaired: complex Conversation: Moderately Complex Interfering components: processing speed Effective technique: extra processing time, pausing,  repetition/stressing words, written cues, and visual/gestural cues   READING COMPREHENSION: Not formally assessed; pt able to read at sentence level with moderate support to answer questionnaire today.  EXPRESSION: verbal  VERBAL EXPRESSION: Level of generative/spontaneous verbalization: phrase Automatic speech: name: intact, social response: intact, counting: 1-20 (approximations, with extra time), day of week: approximations, 7/7 with self-correction, and month of year: 12 with mod cues (verbal, gestural)   Repetition: Impaired: Word Naming: Responsive: {slpnamingimpairment:27213}, Confrontation: {slpnamingimpairment:27213}, and Divergent: {slpnamingimpairment:27213} Pragmatics: Appears intact Comments: *** Interfering components: speech intelligibility and verbal apraxia Effective technique: sentence completion and articulatory cues Non-verbal means of communication: gestures  WRITTEN EXPRESSION: Dominant hand: right Written expression:  attempts to write address; approximates however legibility is poor with non-dominant hand / right-sided weakness  MOTOR SPEECH: Overall motor speech: impaired Level of impairment: Word Respiration:  wfl Phonation: normal Resonance: WFL Articulation: Impaired: word Intelligibility: Intelligibility reduced Motor planning: Impaired: aware, groping for words, and inconsistent Effective technique: slow rate and visual cues, imitation   ORAL MOTOR EXAMINATION Facial : Symmetry impaired: Impaired right, Suspect CN VII impairment, Strength impaired: Impaired right, Suspect CN VII impairment Lingual: Symmetry Impaired: Impaired right, Suspect CN XII impairment, Strength Impaired: Impaired right, Suspect CN XII impairment Velum: WFL Mandible: WFL Cough: WFL Voice: WFL   STANDARDIZED ASSESSMENTS:   Western Aphasia Battery- Revised  Spontaneous Speech                           Information content  6/10                                             Fluency                                 4/10                                          Comprehension     Yes/No questions                 57/60                                           Auditory Word Recognition  58/60                                Sequential Commands       78/80                              Repetition                             51/100                                        Naming    Object Naming                     50/60                                           Word Fluency                        5/20                                            Sentence Completion          8/10                                             Responsive Speech              10/10                                         Aphasia Quotient                  64.1/100  Pt's severity rating was moderate as indicated by an Aphasia Quotient of 64.1 (0-25=very severe, 26-50=severe, 51-75=moderate, 76 and above is mild). 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 Reading and writing were not formally assessed but will be assessed in subsequent visits. Pt's verbal expression is characterized by halting, primarily single words/ telegraphic speech but some noun +verb phrases. Intelligibility is also hindered by verbal apraxia (appears moderate in nature) and dysarthria secondary to right CN VII and XII impairments.    PATIENT REPORTED OUTCOME MEASURES (PROM): Pt's son rated pt's communication using the Communicative Effectiveness Index (CETI). He rated the following abilities to be close to as able as before: getting someone's attention, answering Y/N questions, indicating he understands what is being said to him, responding or communicating without words, and having coffee-time visits with friends and neighbors. He rated Nazir's greatest difficulties in  having a spontaneous conversation, starting a conversation with individuals who are not close family, being part of a conversation that is fast and with a number of people involved, participating in conversations with strangers, and describing or discussing something in depth.    TODAY'S TREATMENT:  SLP set up account with [facility] and assigned home tasks for apraxia and aphasia. Demonstrated how to access and perform the assigned tasks.    PATIENT EDUCATION: Education details: What is apraxia vs aphasia, activities for home practice Person educated: Patient and son and daughter-in-law Education method: Explanation, Demonstration, and written supports Education comprehension: verbalized understanding and needs further education   GOALS: Goals reviewed with patient? Yes  SHORT TERM GOALS: Target date: 10 sessions  *** Baseline: Goal status: {GOALSTATUS:25110}  2.  *** Baseline:  Goal status: {GOALSTATUS:25110}  3.  *** Baseline:  Goal status: {GOALSTATUS:25110}  4.  *** Baseline:  Goal status: {GOALSTATUS:25110}  5.  *** Baseline:  Goal status: {GOALSTATUS:25110}  6.  *** Baseline:  Goal status: {GOALSTATUS:25110}  LONG TERM GOALS: Target date:   *** Baseline:  Goal status: {GOALSTATUS:25110}  2.  *** Baseline:  Goal status: {GOALSTATUS:25110}  3.  *** Baseline:  Goal status: {GOALSTATUS:25110}  4.  *** Baseline:  Goal status: {GOALSTATUS:25110}  5.  *** Baseline:  Goal status: {GOALSTATUS:25110}  6.  *** Baseline:  Goal status: {GOALSTATUS:25110}  ASSESSMENT:  CLINICAL IMPRESSION: Patient is a *** y.o. *** who was seen today for ***.   OBJECTIVE IMPAIRMENTS include {SLPOBJIMP:27107}. These impairments are limiting patient from {SLPLIMIT:27108}. Factors affecting potential to achieve goals and functional outcome are {SLP factors:25450}. Patient will benefit from skilled SLP services to address above impairments and improve overall  function.  REHAB POTENTIAL: Excellent  PLAN: SLP FREQUENCY: 2x/week  SLP DURATION: 12 weeks  PLANNED INTERVENTIONS: Aspiration precaution training, Pharyngeal strengthening exercises, Diet toleration management , Language facilitation, Environmental controls, Trials of upgraded texture/liquids, Cognitive reorganization, Internal/external aids, Functional tasks, Multimodal communication approach, SLP instruction and feedback, Compensatory strategies, and Patient/family education    [name], MS, Actor [phone]

## 2021-11-06 NOTE — Therapy (Signed)
OUTPATIENT OCCUPATIONAL THERAPY NEURO EVALUATION  Patient Name: Gerald Bauer MRN: [id number] DOB:07-27-1945, 76 y.o., male Today's Date: 11/07/2021  PCP: Threasa Alpha, PA REFERRING PROVIDER: Reesa Chew   OT End of Session - 11/07/21 1445     Visit Number 1    Number of Visits 24    Date for OT Re-Evaluation 01/29/22    OT Start Time 1308    OT Stop Time 4332    OT Time Calculation (min) 57 min    Activity Tolerance Patient tolerated treatment well    Behavior During Therapy [facility] for tasks assessed/performed            Past Medical History:  Diagnosis Date   Acid reflux    Arthritis    hands   Carotid atherosclerosis    Diabetes mellitus without complication ([place])    Hyperlipemia    Hypertension    Prostate cancer ([place])    history   Tachycardia    Past Surgical History:  Procedure Laterality Date   [place]  2013   PROSTATECTOMY  2012   Patient Active Problem List   Diagnosis Date Noted   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   Stroke (cerebrum) ([place]) 10/06/2021   Aphasia 10/05/2021   Myalgia due to statin 02/21/2021   Type 2 diabetes mellitus without complication, without long-term current use of insulin ([place]) 02/21/2021   Aortic atherosclerosis ([place]) 04/27/2020   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   Left foot pain 01/10/2017   Tinea pedis of both feet 01/10/2017   Onychomycosis 01/10/2017   Needs flu shot 01/10/2017   Onychomycosis of multiple toenails with type 2 diabetes mellitus ([place]) 07/19/2016   SOB (shortness of breath) 07/14/2016   Carotid stenosis 03/13/2016   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 07/11/2015   Medication monitoring encounter 05/04/2015   Ganglion cyst 02/23/2015   Joint pain in fingers of right hand 02/23/2015   Hyperlipidemia associated with type 2 diabetes mellitus ([place])    Hypertension associated with type 2 diabetes mellitus ([place])    Personal history of prostate cancer     Carotid atherosclerosis    Fatigue 08/20/2013   GERD (gastroesophageal reflux disease) 08/20/2013    ONSET DATE: 10/05/21  REFERRING DIAG: L MCA CVA  THERAPY DIAG:  Muscle weakness (generalized)  Other lack of coordination  Left middle cerebral artery stroke ([place])  Rationale for Evaluation and Treatment Rehabilitation  SUBJECTIVE:   SUBJECTIVE STATEMENT: Son reports pt is very eager to be independent. Pt accompanied by: family member (son, Darren and DIL, [place])  PERTINENT HISTORY: Per chart, [name] is a 76 year old right-handed male with history of hypertension, hyperlipidemia, diabetes mellitus, prostate cancer/prostatectomy 2012, quit smoking 24 years ago.  Presented to [facility]20[facility] with right side weakness/facial droop and expressive aphasia.  Blood pressure 155/102.  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  No associated hemorrhage or significant mass effect.    PRECAUTIONS: None  WEIGHT BEARING RESTRICTIONS No  PAIN:  Are you having pain? Yes: Pain location: R/L shoulders Pain description: achy, sore, stiff Aggravating factors: movement Relieving factors: rest  FALLS: Has patient fallen in last 6 months? Yes. Number of falls 3 since CVA  LIVING ENVIRONMENT: Lives with: Prior to stroke pt lived alone, now staying with son and DIL who provide 24 hour assist. Lives in: House/apartment Stairs: Yes: External: 4 steps; can reach both Has following equipment at home: Gilford Rile -  2 wheeled, Wheelchair (manual), Electronics engineer, and Grab bars  PLOF: Independent/retired Dealer  PATIENT GOALS : Pt points to his hand (wants to be able to use his hand)  OBJECTIVE:   HAND DOMINANCE: Right  ADLs: Transfers/ambulation related to ADLs: supv for transfers Eating: using L non-dominant hand; assist to cut food  Grooming: using L non-dominant hand to shave with electric razor and brush teeth/wash face UB Dressing: set up LB  Dressing: elastic laces and wears elastic waisted pants; unable to manage clothing fasteners Toileting: reported stress incontinence Bathing: supv from son  Tub Shower transfers: transfer tub bench with supv Equipment: Transfer tub bench   IADLs: Shopping: dep Light housekeeping: dep Meal Prep: max A Community mobility: RW with supv Medication management: Family sets up medications  Financial management: son assists though pt managed all finances prior to CVA Handwriting: unable; pt was able to hold a pen and lightly trace an "W" for the first letter of his initials.  Was unable to construct legibly on his own.  MOBILITY STATUS:    POSTURE COMMENTS:  R sided hemiparesis Sitting balance: Moves/returns truncal midpoint >2 inches in all planes  ACTIVITY TOLERANCE: Activity tolerance: Pt reports increased fatigue with community outings  FUNCTIONAL OUTCOME MEASURES: FOTO: 48  UPPER EXTREMITY ROM     Active ROM Right eval Left Eval WNL  Shoulder flexion 90 (135)   Shoulder abduction 95 (110)   Shoulder adduction    Shoulder extension    Shoulder internal rotation    Shoulder external rotation    Elbow flexion    Elbow extension    Wrist flexion    Wrist extension    Wrist ulnar deviation    Wrist radial deviation    Wrist pronation    Wrist supination    (Blank rows = not tested)   UPPER EXTREMITY MMT:     MMT Right eval Left Eval 5/5  Shoulder flexion 3-   Shoulder abduction 3-   Shoulder adduction    Shoulder extension    Shoulder internal rotation 3-   Shoulder external rotation 3-   Middle trapezius    Lower trapezius    Elbow flexion 4-   Elbow extension 4+   Wrist flexion 4   Wrist extension 4-   Wrist ulnar deviation    Wrist radial deviation    Wrist pronation    Wrist supination    (Blank rows = not tested)  HAND FUNCTION: Grip strength: Right: 14 lbs; Left: 58 lbs, Lateral pinch: Right: 7 lbs, Left: 19 lbs, and 3 point pinch: Right: 2  lbs, Left: 16 lbs  COORDINATION: Finger Nose Finger test: difficult/lacks precision with reaching targets 9 Hole Peg test: Right: unable sec; Left: 27 sec Able to oppose 2nd and 3rd digits to thumb on R hand  SENSATION: WFL  EDEMA: none  MUSCLE TONE: RUE: Within functional limits  COGNITION: Overall cognitive status: difficult to assess d/t aphasia  VISION: Subjective report: No significant changes reported since CVA Baseline vision: Wore glasses all the time prior to CVA  Visual history: cataracts (R eye)   VISION ASSESSMENT: Tracking/Visual pursuits: Able to track stimulus in all quads without difficulty Saccades: WFL Visual Fields: no apparent deficits   PERCEPTION: WFL  PRAXIS: Impaired: Motor planning  OBSERVATIONS: Pt pleasant, cooperative, making excellent efforts with all evaluation activities and making strong efforts to verbalize answers beyond "yes/no" responses.  TODAY'S TREATMENT:  Evaluation completed.  PATIENT EDUCATION: Education details: OT role, goals, poc Person educated:  Patient and son/DIL Education method: Explanation and Verbal cues Education comprehension: verbalized understanding   HOME EXERCISE PROGRAM: To be initiated next session   GOALS: Goals reviewed with patient? Yes  SHORT TERM GOALS: Target date: 12/18/21  Pt will be indep to perform HEP for increasing strength and coordination throughout RUE.  Baseline: Not yet initiated Goal status: INITIAL  2.  Pt will manage clothing fasteners with extra time, using RUE as an assist  Baseline: unable; pt has elastic laces and wears elastic waisted pants. Goal status: INITIAL  LONG TERM GOALS: Target date: 01/29/22  Pt will increase FOTO score to 55 or better to indicate improved performance with daily tasks.  Baseline: 48 Goal status: INITIAL  2.  Pt will increase R grip strength by 10 or more lbs to enable pt to hold and carry light ADL supplies without dropping.  Baseline: R grip  14#, L 58# Goal status: INITIAL  3.  Pt will increase RUE strength to be able to engage RUE into ADLs at least 50% of the time. Baseline: Pt using L non-dominant arm to manage ADLs. Goal status: INITIAL  4.  Pt will complete 9 hole peg test on the R in 3 min or less to work towards ability to use R hand to pick up small ADL supplies.  Baseline: Pt can remove a peg but can not pick one up Goal status: INITIAL   ASSESSMENT:  CLINICAL IMPRESSION: Patient is a 76 y.o. male who was seen today for occupational therapy evaluation for functional decline post L MCA CVA.  Pt presents with RUE hemiparesis and expressive aphasia.  Pt lived alone and was indep with all ADLs prior to CVA.  Now staying with son and daughter-in-law who provide 24 hr supv to assist with self care and IADL tasks.   PERFORMANCE DEFICITS in functional skills including ADLs, IADLs, coordination, dexterity, ROM, strength, pain, flexibility, FMC, GMC, mobility, balance, continence, decreased knowledge of use of DME, and UE functional use, cognitive skills including safety awareness, and psychosocial skills including.   IMPAIRMENTS are limiting patient from ADLs, IADLs, leisure, and social participation.   COMORBIDITIES may have co-morbidities  that affects occupational performance. Patient will benefit from skilled OT to address above impairments and improve overall function.  MODIFICATION OR ASSISTANCE TO COMPLETE EVALUATION: Min-Moderate modification of tasks or assist with assess necessary to complete an evaluation.  OT OCCUPATIONAL PROFILE AND HISTORY: Problem focused assessment: Including review of records relating to presenting problem.  CLINICAL DECISION MAKING: Moderate - several treatment options, min-mod task modification necessary  REHAB POTENTIAL: Good  EVALUATION COMPLEXITY: Moderate    PLAN: OT FREQUENCY: 2x/week  OT DURATION: 12 weeks  PLANNED INTERVENTIONS: self care/ADL training, therapeutic  exercise, therapeutic activity, neuromuscular re-education, manual therapy, passive range of motion, balance training, functional mobility training, moist heat, cryotherapy, patient/family education, cognitive remediation/compensation, energy conservation, coping strategies training, and DME and/or AE instructions  RECOMMENDED OTHER SERVICES: N/A  CONSULTED AND AGREED WITH PLAN OF CARE: Patient and family member/caregiver  PLAN FOR NEXT SESSION: Initiate HEP, neuro re-ed, therapeutic exercises  Leta Speller, MS, OTR/L  Darleene Cleaver, OT 11/07/2021, 2:46 PM

## 2021-11-07 ENCOUNTER — Encounter: Payer: Self-pay | Admitting: Family Medicine

## 2021-11-07 ENCOUNTER — Ambulatory Visit (INDEPENDENT_AMBULATORY_CARE_PROVIDER_SITE_OTHER): Payer: Medicare Other | Admitting: Family Medicine

## 2021-11-07 VITALS — BP 128/78 | HR 98 | Temp 98.3°F | Resp 16 | Ht 67.0 in | Wt 166.6 lb

## 2021-11-07 DIAGNOSIS — G720 Drug-induced myopathy: Secondary | ICD-10-CM | POA: Diagnosis not present

## 2021-11-07 DIAGNOSIS — M791 Myalgia, unspecified site: Secondary | ICD-10-CM

## 2021-11-07 DIAGNOSIS — T466X5A Adverse effect of antihyperlipidemic and antiarteriosclerotic drugs, initial encounter: Secondary | ICD-10-CM

## 2021-11-07 DIAGNOSIS — E1169 Type 2 diabetes mellitus with other specified complication: Secondary | ICD-10-CM | POA: Diagnosis not present

## 2021-11-07 DIAGNOSIS — I69320 Aphasia following cerebral infarction: Secondary | ICD-10-CM

## 2021-11-07 DIAGNOSIS — Z09 Encounter for follow-up examination after completed treatment for conditions other than malignant neoplasm: Secondary | ICD-10-CM | POA: Diagnosis not present

## 2021-11-07 DIAGNOSIS — E119 Type 2 diabetes mellitus without complications: Secondary | ICD-10-CM

## 2021-11-07 DIAGNOSIS — I69359 Hemiplegia and hemiparesis following cerebral infarction affecting unspecified side: Secondary | ICD-10-CM

## 2021-11-07 DIAGNOSIS — I69391 Dysphagia following cerebral infarction: Secondary | ICD-10-CM

## 2021-11-07 DIAGNOSIS — Z7902 Long term (current) use of antithrombotics/antiplatelets: Secondary | ICD-10-CM

## 2021-11-07 DIAGNOSIS — R399 Unspecified symptoms and signs involving the genitourinary system: Secondary | ICD-10-CM

## 2021-11-07 DIAGNOSIS — E785 Hyperlipidemia, unspecified: Secondary | ICD-10-CM

## 2021-11-07 DIAGNOSIS — I152 Hypertension secondary to endocrine disorders: Secondary | ICD-10-CM

## 2021-11-07 DIAGNOSIS — E1159 Type 2 diabetes mellitus with other circulatory complications: Secondary | ICD-10-CM

## 2021-11-07 MED ORDER — METFORMIN HCL ER 500 MG PO TB24: 500.0000 mg | ORAL_TABLET | Freq: Two times a day (BID) | ORAL | 3 refills | Status: DC

## 2021-11-07 MED ORDER — CLOPIDOGREL BISULFATE 75 MG PO TABS: 75.0000 mg | ORAL_TABLET | Freq: Every day | ORAL | 3 refills | Status: DC

## 2021-11-07 MED ORDER — AMLODIPINE BESYLATE 5 MG PO TABS: 5.0000 mg | ORAL_TABLET | Freq: Every day | ORAL | 1 refills | Status: DC

## 2021-11-07 MED ORDER — TAMSULOSIN HCL 0.4 MG PO CAPS: 0.4000 mg | ORAL_CAPSULE | Freq: Every day | ORAL | 3 refills | Status: DC

## 2021-11-07 MED ORDER — AMLODIPINE BESYLATE 5 MG PO TABS
5.0000 mg | ORAL_TABLET | Freq: Every day | ORAL | 1 refills | Status: DC
Start: 2021-11-07 — End: 2021-11-07

## 2021-11-07 NOTE — Assessment & Plan Note (Signed)
Continue repatha per cardiology

## 2021-11-07 NOTE — Assessment & Plan Note (Signed)
See above

## 2021-11-07 NOTE — Assessment & Plan Note (Signed)
Pt previously had DM well controlled with metformin XR 500 mg once daily - some SE and intolerance with higher doses, we were working on gradually increasing dose last year to 500 mg BID Last A1C in hospital 7.1 - fairly well controlled for age, but had increased since I saw him last (oct 2022)  CBGs since hospital have been hard to get, getting checked QID in rehab and difficulty getting at home - 137 once and 159 another time Recent pertinent labs: Lab Results  Component Value Date   HGBA1C 7.1 (H) 10/06/2021   HGBA1C 6.5 (H) 01/23/2021   HGBA1C 7.3 (H) 10/04/2020   Will reduce metformin dose since pt endorses GI upset with new dose 1000 mg BID Decrease to 500 mg BID with food and increase slowly and monitor for tolerance Explained that they do not have to check blood sugar unless he is symptomatic or confused We will monitor here with a repeat A1C in oct/nov Work on healthy foods, avoid sweets, simple carbs

## 2021-11-07 NOTE — Progress Notes (Signed)
Name: Gerald Bauer   MRN: [id number]    DOB: [birth year]   Date:11/07/2021       Progress Note  Chief Complaint  Patient presents with   Hospitalization Follow-up    Stroke      Subjective:   Gerald Bauer is a 76 y.o. male, presents to clinic for f/up appt for routine care and after recent stroke. Presents here with his son [name] and DIL [name] who are caring for him since his stroke - he moved in with them  Last OV and labs was Oct 2022 with me- reviewed today  DM previously well controlled on metformin, he was not on ACEI/ARB, and on repatha for HLD well controlled and overseen by cardiology as well as here  Hyperlipidemia: Not able to take statins due to myopathy/myalgias and elevated LFTs, repatha was approved per cardiology - he was working with CCM pharmacists as well Last Lipids: Lab Results  Component Value Date   CHOL 112 10/06/2021   HDL 47 10/06/2021   [place] 44 10/06/2021   TRIG 105 10/06/2021   CHOLHDL 2.4 10/06/2021    Unfortunately he presented to the ER on 7/13 admitted with aphasia and acute stroke, admit 7/13-7/17 then admitted to rehab through 10/31/2021  Due to f/up neurology-they have an appt 9/14, and he is doing therapy with physical medicine rehab here at [facility]  He has right sided paralysis, expressive aphasia and dysphasia He has progressed to small bites, no straws (aspirates) Continues to work with PT, OT, SLP He has walker and home DME - WC, bathing supplies, [name] and Darren help with transferring sometimes - with gait belt, pt is able to ambulate with rolling walker and go to the bathroom on his own and he been able to use his arm some (more than in ED) speak some - saying no, and gradual swallowing improvement.    He was started on BP meds - amlodipine, CBGs were high inpt, previously A1C well controlled on and off low dose metformin due to intolerance, currently on 1000 mg BID, previously on 500 mg once qd  Extensive chart review -   Found neurohospitalists consult and updated family - DAPT indefinitely   Assessment: 76 year old male presenting with acute aphasia - Exam reveals findings referable to the left Broca's and left putamen/caudate acute ischemic infarctions seen on MRI.  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 No associated hemorrhage or significant regional mass effect. Loss of normal flow voids throughout the left ICA and MCA, consistent with slow flow and/or occlusion. 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 Underlying mild chronic microvascular ischemic disease. Tiny remote left ACA distribution infarct. - MRA head: Interval near complete occlusion of the left ICA and MCA, presumably acute in nature given the presence of the acute left MCA territory infarct. Left A1 segment not well seen either, also likely occluded. Otherwise wide patency of the major intracranial arterial circulation.  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 Some attenuated flow seen distally within the cervical left ICA, with subsequent reocclusion by the skull base. 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 Otherwise wide patency of the right carotid artery system. Wide patency of the vertebral arteries within the neck. - EKG: Sinus tachycardia; Borderline left axis deviation; Low voltage, precordial leads Borderline prolonged QT  interval - Has failed ASA monotherapy - Stroke risk factors: Carotid atherosclerosis, DM, HLD, HTN and cancer history.    Recommendations: - TTE is pending - Cardiac telemetry - Permissive HTN for a 24 hour period following onset of symptoms - Add Plavix to ASA. Continue DAPT indefinitely - Atorvastatin 40 mg po qd - Glycemic control - PT/OT/Speech - Most likely would benefit from CIR -  HgbA1c, fasting lipid panel - Risk factor modification - Frequent neuro checks - NPO until passes stroke swallow screen - Stroke diagnosis, prognosis and plan of care discussed with family at the bedside. All questions answered   Addendum: TTE:  1. Left ventricular ejection fraction, by estimation, is 55 to 60%. The  left ventricle has normal function. The left ventricle has no regional  wall motion abnormalities. Left ventricular diastolic parameters are  consistent with Grade I diastolic dysfunction (impaired relaxation).   2. Right ventricular systolic function is normal. The right ventricular  size is normal. There is normal pulmonary artery systolic pressure. The estimated right ventricular systolic pressure is 98.9 mmHg.   3. The mitral valve is normal in structure. No evidence of mitral valve  regurgitation. No evidence of mitral stenosis.   4. The aortic valve is normal in structure. Aortic valve regurgitation is  not visualized. Aortic valve sclerosis is present, with no evidence of  aortic valve stenosis.   5. The inferior vena cava is normal in size with greater than 50%  respiratory variability, suggesting right atrial pressure of 3 mmHg.    Addendum: - Stroke work up complete - Continue with above management plan - Neurohospitalist service will continue to follow on a PRN basis.   Electronically signed: Dr. Kerney Elbe  Reviewed last PM&R MD notes: Medical Problem List and Plan: 1. Functional deficits secondary to moderate size left MCA distribution infarction with aphasia as well as right-sided weakness             -patient may  shower             -ELOS/Goals: 8/8 Min A             Continue CIR- PT, OT and SLP Min A swallow/sup A for communication, Max A for verbal expression  2.  Impaired mobility, ambulating 270 feet: d/c lovenox. Continue Aspirin 81 mg daily and Plavix 75 mg daily to continue indefinitely 3. Shoulder arthritis: voltaren gel ordered, changed to  prn. Discussed with patient. Discussed with therapy that I do not usually see spikes in blood pressure with topical voltaren gel, we can try again today and assess for these changes in vitals. Tylenol as needed. Provided pain relief journal             7/31- denied need for pain meds this AM- con't regimen             8/7- no pain today- con't regimen 4. Mood/Behavior/Sleep: Provide emotional support             -antipsychotic agents: N/A 5. Neuropsych/cognition: This patient is not capable of making decisions on his own behalf. 6. Skin/Wound Care: Routine skin checks 7. Fluids/Electrolytes/Nutrition: Routine in and outs with follow-up chemistries 8.  Hypertension.  Continue Norvasc 5 mg daily.  Monitor with increased mobility             8/7- BP controlled- con't regimen 9.  Diabetes mellitus.  Hemoglobin A1c 7.1. d/c ISS. Patient maintained on Increase metformin to 1,000 mg BID  Decrease CBG checks to AC BID             Discussed minimizing foods with added sugar             CBG (last 3)  Recent Labs (last 2 labs)        Recent Labs    10/29/21 0613 10/30/21 0621 10/31/21 0612  GLUCAP 129* 190* 132*                 8/3 good control, continue metformin             8/7- fair control 10.  Hyperlipidemia.  Intolerant to statin. Continue Repatha 140 mg subcutaneous every 2 weeks prior to admission. 11.  History     Current Outpatient Medications:    acetaminophen (TYLENOL) 325 MG tablet, Take 2 tablets (650 mg total) by mouth every 4 (four) hours as needed for mild pain (or temp > 37.5 C (99.5 F))., Disp: , Rfl:    albuterol (VENTOLIN HFA) 108 (90 Base) MCG/ACT inhaler, Inhale 2 puffs into the lungs every 6 (six) hours as needed for wheezing or shortness of breath., Disp: 8 g, Rfl: 0   amLODipine (NORVASC) 5 MG tablet, Take 1 tablet (5 mg total) by mouth daily., Disp: 30 tablet, Rfl: 0   aspirin EC 81 MG tablet, Take 81 mg by mouth daily. Swallow whole., Disp: , Rfl:     Cholecalciferol (VITAMIN D3) 25 MCG (1000 UT) CAPS, Take 1,000 Units by mouth daily., Disp: , Rfl:    clopidogrel (PLAVIX) 75 MG tablet, Take 1 tablet (75 mg total) by mouth daily., Disp: 30 tablet, Rfl: 0   Cyanocobalamin (VITAMIN B12) 500 MCG TABS, Take 500 mcg by mouth daily., Disp: , Rfl:    Lancets (ONETOUCH DELICA PLUS [id number]) MISC, USE TO CHECK BLOOD GLUCOSE  ONCE DAILY AS DIRECTED, Disp: 100 each, Rfl: 2   magnesium gluconate (MAGONATE) 500 MG tablet, Take 1 tablet (500 mg total) by mouth at bedtime., Disp: 30 tablet, Rfl: 0   metFORMIN (GLUCOPHAGE) 1000 MG tablet, Take 1 tablet (1,000 mg total) by mouth 2 (two) times daily with a meal., Disp: 60 tablet, Rfl: 0   ONETOUCH VERIO test strip, CHECK FINGERSTICK BLOOD  SUGARS ONCE DAILY, Disp: 100 strip, Rfl: 2   REPATHA SURECLICK 725 MG/ML SOAJ, INJECT '140MG'$  SUBCUTANEOUSLY  EVERY 2 WEEKS, Disp: 6 mL, Rfl: 2   tamsulosin (FLOMAX) 0.4 MG CAPS capsule, Take 1 capsule (0.4 mg total) by mouth daily after supper., Disp: 30 capsule, Rfl: 0  Patient Active Problem List   Diagnosis Date Noted   Hemiparesis, aphasia, and dysphagia as late effects of stroke ([place]) 11/07/2021   Lower urinary tract symptoms (LUTS) 11/07/2021   Aphasia 10/05/2021   Myalgia due to statin 02/21/2021   Type 2 diabetes mellitus without complication, without long-term current use of insulin ([place]) 02/21/2021   Aortic atherosclerosis ([place]) 04/27/2020   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   Onychomycosis of multiple toenails with type 2 diabetes mellitus ([place]) 07/19/2016   Carotid stenosis 03/13/2016   Hyperlipidemia associated with type 2 diabetes mellitus ([place])    Hypertension associated with type 2 diabetes mellitus ([place])    Personal history of prostate cancer    Carotid atherosclerosis    GERD (gastroesophageal reflux disease) 08/20/2013    Past Surgical History:  Procedure Laterality Date   APPENDECTOMY  1973   CHOLECYSTECTOMY  2013   PROSTATECTOMY  2012    Family  History  Problem Relation Age of  Onset   Dementia Mother    High Cholesterol Mother    Hypertension Mother    Arthritis Mother    Hyperlipidemia Mother    Heart disease Father    Heart attack Father    Arthritis Maternal Grandmother    Cancer Maternal Grandmother        colon   Prostate cancer Brother    Cancer Brother        prostate   Cancer Maternal Uncle        colon   COPD Maternal Uncle    Stroke Paternal Grandmother    Diabetes Paternal Grandmother    Aneurysm Paternal Grandfather     Social History   Tobacco Use   Smoking status: Former    Packs/day: 1.00    Years: 40.00    Total pack years: 40.00    Types: Cigarettes    Quit date: 03/09/1997    Years since quitting: 24.6   Smokeless tobacco: Former   Tobacco comments:    smoking cessation materials not required  Vaping Use   Vaping Use: Never used  Substance Use Topics   Alcohol use: No    Alcohol/week: 0.0 standard drinks of alcohol   Drug use: No     Allergies  Allergen Reactions   Amoxicillin Swelling   Statins Other (See Comments)    Affects the liver   Welchol [Colesevelam Hcl] Other (See Comments)    affects the liver    Health Maintenance  Topic Date Due   COLONOSCOPY (Pts 45-59yr Insurance coverage will need to be confirmed)  10/01/2018   COVID-19 Vaccine (2 - [place] risk series) 10/06/2019   FOOT EXAM  04/25/2021   URINE MICROALBUMIN  04/25/2021   INFLUENZA VACCINE  10/24/2021   OPHTHALMOLOGY EXAM  01/31/2022   HEMOGLOBIN A1C  04/08/2022   TETANUS/TDAP  03/04/2024   Pneumonia Vaccine 76 Years old  Completed   Hepatitis C Screening  Completed   Zoster Vaccines- Shingrix  Completed   HPV VACCINES  Aged Out    Chart Review Today: I personally reviewed active problem list, medication list, allergies, family history, social history, health maintenance, notes from last encounter, lab results, imaging with the patient/caregiver today.   Review of Systems  Constitutional:  Negative.   HENT: Negative.    Eyes: Negative.   Respiratory: Negative.    Cardiovascular: Negative.   Gastrointestinal: Negative.   Endocrine: Negative.   Genitourinary: Negative.   Musculoskeletal: Negative.   Skin: Negative.   Allergic/Immunologic: Negative.   Neurological: Negative.   Hematological: Negative.   Psychiatric/Behavioral: Negative.    All other systems reviewed and are negative.    Objective:   Vitals:   11/07/21 1343  BP: 128/78  Pulse: 98  Resp: 16  Temp: 98.3 F (36.8 C)  TempSrc: Oral  SpO2: 99%  Weight: 166 lb 9.6 oz (75.6 kg)  Height: '5\' 7"'$  (1.702 m)    Body mass index is 26.09 kg/m.  Physical Exam Vitals and nursing note reviewed.  Constitutional:      General: He is not in acute distress.    Appearance: He is not toxic-appearing or diaphoretic.  HENT:     Head: Normocephalic and atraumatic.  Cardiovascular:     Rate and Rhythm: Normal rate and regular rhythm.     Pulses: Normal pulses.     Heart sounds: Normal heart sounds.  Pulmonary:     Effort: Pulmonary effort is normal.     Breath sounds: Normal breath sounds.  Abdominal:     General: Bowel sounds are normal.     Palpations: Abdomen is soft.  Musculoskeletal:     Right lower leg: No edema.     Left lower leg: No edema.  Neurological:     Mental Status: He is alert.     Gait: Gait abnormal.     Comments: Right facial droop Aphasia Right hemiparesis Uses rolling walker         Assessment & Plan:   Problem List Items Addressed This Visit       Cardiovascular and Mediastinum   Hypertension associated with type 2 diabetes mellitus (HCC)    On amlodipine, tolerating well no SE or concerns BP at goal today BP Readings from Last 3 Encounters:  11/07/21 128/78  10/31/21 130/71  10/09/21 (!) 127/93   Continue amlodipine      Relevant Medications   amLODipine (NORVASC) 5 MG tablet   metFORMIN (GLUCOPHAGE-XR) 500 MG 24 hr tablet     Endocrine   Hyperlipidemia  associated with type 2 diabetes mellitus ([place])    Lab Results  Component Value Date   CHOL 112 10/06/2021   HDL 47 10/06/2021   LDLCALC 44 10/06/2021   TRIG 105 10/06/2021   CHOLHDL 2.4 10/06/2021  Lipids have been well controlled on repatha Family will have to administer SQ injection - they can come to [place], pharmacy or check with cardiology (they have managed and prescribed) for training to help administer to pt, previously he was doing it himself      Relevant Medications   amLODipine (NORVASC) 5 MG tablet   metFORMIN (GLUCOPHAGE-XR) 500 MG 24 hr tablet   Type 2 diabetes mellitus without complication, without long-term current use of insulin (HCC)    Pt previously had DM well controlled with metformin XR 500 mg once daily - some SE and intolerance with higher doses, we were working on gradually increasing dose last year to 500 mg BID Last A1C in hospital 7.1 - fairly well controlled for age, but had increased since I saw him last (oct 2022)  CBGs since hospital have been hard to get, getting checked QID in rehab and difficulty getting at home - 137 once and 159 another time Recent pertinent labs: Lab Results  Component Value Date   HGBA1C 7.1 (H) 10/06/2021   HGBA1C 6.5 (H) 01/23/2021   HGBA1C 7.3 (H) 10/04/2020   Will reduce metformin dose since pt endorses GI upset with new dose 1000 mg BID Decrease to 500 mg BID with food and increase slowly and monitor for tolerance Explained that they do not have to check blood sugar unless he is symptomatic or confused We will monitor here with a repeat A1C in oct/nov Work on healthy foods, avoid sweets, simple carbs      Relevant Medications   metFORMIN (GLUCOPHAGE-XR) 500 MG 24 hr tablet     Musculoskeletal and Integument   Statin myopathy    Continue repatha per cardiology        Other   Myalgia due to statin    See above      Hemiparesis, aphasia, and dysphagia as late effects of stroke ([place])    Pt discharged from rhab 8/8  after initially presenting with aphasia and acute stroke on 7/13 They have DME set up, pt living with his son and daughter in law, going to rehab now locally at [facility].  They have neuro consult set up for next month. Pre chart review pt to remain on DAPT  indefinitely On norvasc and repatha, we will work to keep glycemic control Pt family may need other resources (palliative care or CCM SW consult?) Would like to see him back in 2-3 months for DM f/up and labs      Relevant Medications   clopidogrel (PLAVIX) 75 MG tablet   Lower urinary tract symptoms (LUTS)    flomax new med since hospital, he was having some difficulty with urinary retention and had to be catheterized many times, no problems like that since adding flomax meds refilled Discussed MOA and SE with pt and family - take before bedtime, f/up if any urinary retention or concerning SE      Relevant Medications   tamsulosin (FLOMAX) 0.4 MG CAPS capsule   Other Visit Diagnoses     Encounter for examination following treatment at hospital    -  Primary   hospital and rehab documents, consults, labs, imaging and discharge summary all reviewed today   Relevant Medications   clopidogrel (PLAVIX) 75 MG tablet   Encounter for long-term (current) use of antiplatelets/antithrombotics       on ASA and plavix, failed ASA, per neurology note initially inpt, pt will f/up with neuro outpt and review, currently no sign/sx of bleeding   Relevant Medications   clopidogrel (PLAVIX) 75 MG tablet        DM labs and f/up at next appt  Watch for mood/depression May needs SW support? Encouraged pt family to contact insurance for any services they cover (home nurse/care aid etc to help them with their new caregiver role 24/7)   Spent more than 30 min with the patient and family members today More than 20 min today on reviewing chart, admissions, rehab, imaging, therapy notes, lab results hospital H&P admission notes, ER notes, d/c notes and  summary, extensive med list and past Med list review  More than 15 min to chart/document, arrange care and order meds and arrange f/up plan  Return for 2-3 month f/up in office for DM/stroke .   [name], PA-C 11/07/21 1:50 PM

## 2021-11-07 NOTE — Patient Instructions (Signed)
[facility] - [phone]  Make sure you have follow up with them   You need a 2-3 month follow up with [place]

## 2021-11-07 NOTE — Assessment & Plan Note (Signed)
On amlodipine, tolerating well no SE or concerns BP at goal today BP Readings from Last 3 Encounters:  11/07/21 128/78  10/31/21 130/71  10/09/21 (!) 127/93   Continue amlodipine

## 2021-11-07 NOTE — Assessment & Plan Note (Signed)
flomax new med since hospital, he was having some difficulty with urinary retention and had to be catheterized many times, no problems like that since adding flomax meds refilled Discussed MOA and SE with pt and family - take before bedtime, f/up if any urinary retention or concerning SE

## 2021-11-07 NOTE — Assessment & Plan Note (Signed)
Lab Results  Component Value Date   CHOL 112 10/06/2021   HDL 47 10/06/2021   LDLCALC 44 10/06/2021   TRIG 105 10/06/2021   CHOLHDL 2.4 10/06/2021   Lipids have been well controlled on repatha Family will have to administer SQ injection - they can come to [place], pharmacy or check with cardiology (they have managed and prescribed) for training to help administer to pt, previously he was doing it himself

## 2021-11-07 NOTE — Assessment & Plan Note (Signed)
Pt discharged from rhab 8/8 after initially presenting with aphasia and acute stroke on 7/13 They have DME set up, pt living with his son and daughter in law, going to rehab now locally at [facility].  They have neuro consult set up for next month. Pre chart review pt to remain on DAPT indefinitely On norvasc and repatha, we will work to keep glycemic control Pt family may need other resources (palliative care or CCM SW consult?) Would like to see him back in 2-3 months for DM f/up and labs

## 2021-11-08 ENCOUNTER — Ambulatory Visit: Payer: Medicare Other | Admitting: Speech Pathology

## 2021-11-08 ENCOUNTER — Ambulatory Visit: Payer: Medicare Other

## 2021-11-08 DIAGNOSIS — R4701 Aphasia: Secondary | ICD-10-CM

## 2021-11-08 DIAGNOSIS — R482 Apraxia: Secondary | ICD-10-CM

## 2021-11-08 DIAGNOSIS — I63512 Cerebral infarction due to unspecified occlusion or stenosis of left middle cerebral artery: Secondary | ICD-10-CM

## 2021-11-08 DIAGNOSIS — R1312 Dysphagia, oropharyngeal phase: Secondary | ICD-10-CM

## 2021-11-08 DIAGNOSIS — M6281 Muscle weakness (generalized): Secondary | ICD-10-CM

## 2021-11-08 DIAGNOSIS — R278 Other lack of coordination: Secondary | ICD-10-CM

## 2021-11-08 NOTE — Therapy (Signed)
OUTPATIENT OCCUPATIONAL THERAPY NEURO TREATMENT  Patient Name: Gerald Bauer MRN: [id number] DOB:September 15, 1945, 76 y.o., male Today's Date: 11/08/2021  PCP: Threasa Alpha, PA REFERRING PROVIDER: Reesa Chew   OT End of Session - 11/08/21 1131     Visit Number 2    Number of Visits 24    Date for OT Re-Evaluation 01/29/22    OT Start Time 47    OT Stop Time 2952    OT Time Calculation (min) 45 min    Activity Tolerance Patient tolerated treatment well    Behavior During Therapy [facility] for tasks assessed/performed            Past Medical History:  Diagnosis Date   Acid reflux    Arthritis    hands   Carotid atherosclerosis    Diabetes mellitus without complication ([place])    Ganglion cyst 02/23/2015   Hyperlipemia    Hypertension    Joint pain in fingers of right hand 02/23/2015   Dominant hand   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 07/11/2015   Prostate cancer ([place])    history   Stroke (cerebrum) ([place]) 10/06/2021   Tachycardia    Tinea pedis of both feet 01/10/2017   Past Surgical History:  Procedure Laterality Date   [place]  2013   PROSTATECTOMY  2012   Patient Active Problem List   Diagnosis Date Noted   Hemiparesis, aphasia, and dysphagia as late effects of stroke ([place]) 11/07/2021   Lower urinary tract symptoms (LUTS) 11/07/2021   Myalgia due to statin 02/21/2021   Type 2 diabetes mellitus without complication, without long-term current use of insulin ([place]) 02/21/2021   Aortic atherosclerosis ([place]) 04/27/2020   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   Onychomycosis of multiple toenails with type 2 diabetes mellitus ([place]) 07/19/2016   Carotid stenosis 03/13/2016   Hyperlipidemia associated with type 2 diabetes mellitus ([place])    Hypertension associated with type 2 diabetes mellitus ([place])    Personal history of prostate cancer    Carotid atherosclerosis    GERD (gastroesophageal  reflux disease) 08/20/2013    ONSET DATE: 10/05/21  REFERRING DIAG: L MCA CVA  THERAPY DIAG:  Muscle weakness (generalized)  Other lack of coordination  Left middle cerebral artery stroke ([place])  Rationale for Evaluation and Treatment Rehabilitation  SUBJECTIVE:   SUBJECTIVE STATEMENT: Pt wanted to try therapy by himself today while family member waited in the lobby.  PERTINENT HISTORY: Per chart, Gerald Bauer is a 76 year old right-handed male with history of hypertension, hyperlipidemia, diabetes mellitus, prostate cancer/prostatectomy 2012, quit smoking 24 years ago.  [facility] with right side weakness/facial droop and expressive aphasia.  Blood pressure 155/102.  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  No associated hemorrhage or significant mass effect.    PAIN:  Are you having pain? Yes: Pain location: R/L shoulders 3/10 pain Pain description: achy, sore, stiff Aggravating factors: movement Relieving factors: rest  FALLS: Has patient fallen in last 6 months? Yes. Number of falls 3 since CVA  PLOF: Independent/retired Dealer  PATIENT GOALS : Pt points to his hand (wants to be able to use his hand)  OBJECTIVE:   HAND DOMINANCE: Right   FUNCTIONAL OUTCOME MEASURES: FOTO: 48  UPPER EXTREMITY ROM     Active ROM Right eval Left Eval WNL  Shoulder flexion 90 (135)   Shoulder abduction 95 (110)  UPPER EXTREMITY MMT:     MMT Right eval Left Eval 5/5  Shoulder flexion 3-   Shoulder abduction 3-   Shoulder adduction    Shoulder extension    Shoulder internal rotation 3-   Shoulder external rotation 3-   Middle trapezius    Lower trapezius    Elbow flexion 4-   Elbow extension 4+   Wrist flexion 4   Wrist extension 4-   (Blank rows = not tested)  HAND FUNCTION: Grip strength: Right: 14 lbs; Left: 58 lbs, Lateral pinch: Right: 7 lbs, Left: 19 lbs, and 3 point pinch: Right: 2 lbs,  Left: 16 lbs  COORDINATION: Finger Nose Finger test: difficult/lacks precision with reaching targets 9 Hole Peg test: Right: unable sec; Left: 27 sec Able to oppose 2nd and 3rd digits to thumb on R hand  TODAY'S TREATMENT:  Therapeutic Exercise: Instructed pt in cane exercises in supine for increasing R shoulder strength and flexibility.  Pt utilized 1# dowel with BUEs to perform chest press, shoulder flex, ER, horiz abd/add, abd x2 sets 10 reps each.  OT provided min guard-min A throughout to support RUE through controlled and maximal ROM arc.    Neuro re-ed: Issued soft, blue theraputty and instructed pt in strengthening and coordination exercises for R hand, including gross grasping, lateral/2 point/3 point pinching, digit abd/add, and digging coins out of putty.  Able to return demo with mod vc to maximize use of R hand and use L hand only as a helper to reposition or stabilize putty while the R hand works Advertising copywriter.  Encouraged completion 5-10 min, 1-2x per day.  Practiced grasp/release with R hand, picking up small balls of putty and dropping into putty cup.  Pt required min vc to sequence "open/close/release" for motor planning with the R hand.     PATIENT EDUCATION: Education details: HEP progression Person educated: pt Education method: Explanation and Verbal cues, handout Education comprehension: verbalized understanding, returned demo   HOME EXERCISE PROGRAM: theraputty  GOALS: Goals reviewed with patient? Yes  SHORT TERM GOALS: Target date: 12/18/21  Pt will be indep to perform HEP for increasing strength and coordination throughout RUE.  Baseline: Not yet initiated Goal status: INITIAL  2.  Pt will manage clothing fasteners with extra time, using RUE as an assist  Baseline: unable; pt has elastic laces and wears elastic waisted pants. Goal status: INITIAL  LONG TERM GOALS: Target date: 01/29/22  Pt will increase FOTO score to 55 or better to indicate  improved performance with daily tasks.  Baseline: 48 Goal status: INITIAL  2.  Pt will increase R grip strength by 10 or more lbs to enable pt to hold and carry light ADL supplies without dropping.  Baseline: R grip 14#, L 58# Goal status: INITIAL  3.  Pt will increase RUE strength to be able to engage RUE into ADLs at least 50% of the time. Baseline: Pt using L non-dominant arm to manage ADLs. Goal status: INITIAL  4.  Pt will complete 9 hole peg test on the R in 3 min or less to work towards ability to use R hand to pick up small ADL supplies.  Baseline: Pt can remove a peg but can not pick one up Goal status: INITIAL   ASSESSMENT:  CLINICAL IMPRESSION: Good tolerance to cane exercises in supine and theraputty exercises at table top.  Pt was given handout to carry over theraputty exercises on a daily basis at home.  Pt will continue to  benefit from skilled OT for increasing strength and coordination throughout the RUE in order to maximize indep with daily tasks.   PERFORMANCE DEFICITS in functional skills including ADLs, IADLs, coordination, dexterity, ROM, strength, pain, flexibility, FMC, GMC, mobility, balance, continence, decreased knowledge of use of DME, and UE functional use, cognitive skills including safety awareness, and psychosocial skills including.   IMPAIRMENTS are limiting patient from ADLs, IADLs, leisure, and social participation.   COMORBIDITIES may have co-morbidities  that affects occupational performance. Patient will benefit from skilled OT to address above impairments and improve overall function.  MODIFICATION OR ASSISTANCE TO COMPLETE EVALUATION: Min-Moderate modification of tasks or assist with assess necessary to complete an evaluation.  OT OCCUPATIONAL PROFILE AND HISTORY: Problem focused assessment: Including review of records relating to presenting problem.  CLINICAL DECISION MAKING: Moderate - several treatment options, min-mod task modification  necessary  REHAB POTENTIAL: Good  EVALUATION COMPLEXITY: Moderate    PLAN: OT FREQUENCY: 2x/week  OT DURATION: 12 weeks  PLANNED INTERVENTIONS: self care/ADL training, therapeutic exercise, therapeutic activity, neuromuscular re-education, manual therapy, passive range of motion, balance training, functional mobility training, moist heat, cryotherapy, patient/family education, cognitive remediation/compensation, energy conservation, coping strategies training, and DME and/or AE instructions  RECOMMENDED OTHER SERVICES: N/A  CONSULTED AND AGREED WITH PLAN OF CARE: Patient and family member/caregiver  PLAN FOR NEXT SESSION: HEP progression, neuro re-ed, therapeutic exercises  Leta Speller, MS, OTR/L  Darleene Cleaver, OT 11/08/2021, 11:34 AM

## 2021-11-08 NOTE — Therapy (Signed)
OUTPATIENT SPEECH LANGUAGE PATHOLOGY TREATMENT AND DYSPHAGIA EVALUATION   Patient Name: Gerald Bauer MRN: [id number] DOB:[birth year], 76 y.o., male Today's Date: 11/08/2021  PCP: [name], PA-C REFERRING PROVIDER: Reesa Chew, PA-C   End of Session - 11/08/21 1016     Visit Number 2    Number of Visits 25    Date for SLP Re-Evaluation 02/04/22    SLP Start Time 1007   pt arrived late   SLP Stop Time  38    SLP Time Calculation (min) 53 min    Activity Tolerance Patient tolerated treatment well             Past Medical History:  Diagnosis Date   Acid reflux    Arthritis    hands   Carotid atherosclerosis    Diabetes mellitus without complication ([place])    Ganglion cyst 02/23/2015   Hyperlipemia    Hypertension    Joint pain in fingers of right hand 02/23/2015   Dominant hand   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 07/11/2015   Prostate cancer ([place])    history   Stroke (cerebrum) ([place]) 10/06/2021   Tachycardia    Tinea pedis of both feet 01/10/2017   Past Surgical History:  Procedure Laterality Date   [place]  2013   PROSTATECTOMY  2012   Patient Active Problem List   Diagnosis Date Noted   Hemiparesis, aphasia, and dysphagia as late effects of stroke ([place]) 11/07/2021   Lower urinary tract symptoms (LUTS) 11/07/2021   Myalgia due to statin 02/21/2021   Type 2 diabetes mellitus without complication, without long-term current use of insulin ([place]) 02/21/2021   Aortic atherosclerosis ([place]) 04/27/2020   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   Onychomycosis of multiple toenails with type 2 diabetes mellitus ([place]) 07/19/2016   Carotid stenosis 03/13/2016   Hyperlipidemia associated with type 2 diabetes mellitus ([place])    Hypertension associated with type 2 diabetes mellitus ([place])    Personal history of prostate cancer    Carotid atherosclerosis    GERD (gastroesophageal reflux  disease) 08/20/2013    ONSET DATE: 10/05/2021   REFERRING DIAG: Cerebral infarction due to unspecified occlusion of left middle cerebral artery  THERAPY DIAG:  Aphasia  Verbal apraxia  Dysphagia, oropharyngeal phase  Rationale for Evaluation and Treatment Rehabilitation  SUBJECTIVE:   SUBJECTIVE STATEMENT: Pt wanted to come unaccompanied to session today. Pt accompanied by: [name] waited in waiting area  PERTINENT HISTORY: Pt  is a 76 year old male with history of left-sided carotid stenosis, non-insulin-dependent diabetes mellitus type 2, PAD, GERD, mild COPD, history of tobacco use, hyperlipidemia, hypertension, who presented 10/05/21 to [facility] ED with facial droop, expressive aphasia, and R sided weakness and found to have left MCA CVA. He was admitted to [facility] 7/17-10/31/21. Advanced to dysphagia 3/thin liquids by cup by time of d/c from CIR. 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 Difficult to fully assess cognition given severity of linguistic impairment; however, does present with decreased safety awareness, diminished problem-solving, emergent awareness, and mild impulsivity with movement."   DIAGNOSTIC FINDINGS: MBS 10/19/21: "Pt presents with overall mild oropharyngeal dysphagia. Oral phase c/b piecemeal deglutition, decreased mastication, decreased bolus cohesion, premature spillage, and weak lingual manipulation. Pharyngeal phase c/b reduced pharyngeal peristalsis + reduced BOT approximation to PPW, which results in mild to moderate vallecular  and pyriform sinuses residuals (vallecula > pyriform sinuses for solids, pyriform sinuses > vallecula for liquids). No penetration nor aspiration appreciated with thin liquid via cup, Dysphagia 1 (puree), Dysphagia 2 (chopped), Dysphagia 3 (mechanical soft), or with 13 mm Barium tablet placed in puree. Once instance of sensed  aspiration with intake of large bolus of thin liquid via straw due to swallow initiation delay to pyriform sinuses and mistiming of swallow-breath cycle; despite strong reflexive cough aspirant was not fully cleared from trachea. Pt's level of swallow initiation was noted to vary with liquid consumption (over base of epiglottis and at pyriform sinuses) with level of swallow initiation for solids to be consistent at the vallecula. Pharyngeal stasis was partially cleared with reflexive secondary swallow." MRI brain 10/05/21: 1. 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 No associated hemorrhage or significant regional mass effect. 2. Loss of normal flow voids throughout the left ICA and MCA, consistent with slow flow and/or occlusion. 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 3. Underlying mild chronic microvascular ischemic disease. Tiny remote left ACA distribution infarct.   PAIN:  Are you having pain? Yes: NPRS scale: 5/10 Pain location: left shoulder   PATIENT GOALS talk better  OBJECTIVE:   ORAL MOTOR EXAMINATION Facial : Symmetry impaired: Impaired right, Suspect CN VII impairment, Strength impaired: Impaired right, Suspect CN VII impairment Lingual: Symmetry Impaired: Impaired right, Suspect CN XII impairment, Strength Impaired: Impaired right, Suspect CN XII impairment Velum: WFL Mandible: WFL Cough: WFL Voice: WFL   CLINICAL SWALLOW ASSESSMENT:   Current diet: regular, thin liquids, current  Diet modifications: Dys 3 was recommended at d/c; small sips, no straw Dentition: adequate natural dentition Feeding: able to feed self Consistencies tested:  Thin Liquid: Presentation: Cup Oral Phase: WFL Pharyngeal Phase: Impaired: delayed throat clearing, immediate cough with thin liquid wash of incompletely cleared solid Puree: Presentation: Spoon and Self-fed Oral Phase: WFL Pharyngeal Phase:  WFL Regular: Presentation: By hand and Self-fed Oral Phase: Impaired: reduced lingual movement/coordination, impaired mastication, prolonged mastication, prolonged bolus formation, and right sided lingual, buccal residue Pharyngeal Phase: Impaired: immediate cough with thin liquid wash of incompletely cleared solid  Evaluation findings: Patient presents with s/sx of (oral, pharyngeal, oropharyngeal) dysphagia characterized by CN VII and CN XII impairments leading to oral phase deficits in bolus formation and clearance, and intermittent coughing with larger sips of thin liquid or with incomplete oral clearance of solid. Pt benefits from using lingual sweep for oral clearance, as well as small, single sips of liquid to reduce overt s/sx aspiration.   Aspiration risk factors:History of CVA Overall aspiration risk:Mild Diet Recommendations: Dysphagia 3 (mechanical soft) and thin liquids Precautions:Minimize environmental distractions, Slow rate, Small sips/bites, No straws, and Lingual sweep for clearance of pocketing Supervision: Patient able to feed self Oral care recommendations:Oral care BID Follow-up recommendations: Therapy as outlined in treatment plan below      PATIENT REPORTED OUTCOME MEASURES (PROM):  EATING ASSESSMENT TOOL (EAT-10)   The patient was asked to rate to what extent the following statements are problematic on a scale of 0-4. 0 = No problem; 4 = Severe problem. A total score of 3 or higher is considered abnormal.  1.) My swallowing problem has caused me to lose weight. 1 2.) My swallowing problem interferes with my ability to go out for meals. 1 3.) Swallowing liquids takes extra effort. 1 4.) Swallowing solids takes extra effort. 1  5.) Swallowing pills takes extra effort. 1 6.)  Swallowing is painful. 0 7.) The pleasure of eating is affected by my swallowing. 1 8.) When I swallow food sticks in my throat. 1 9.) I cough when I eat. 1 10.) Swallowing is stressful. 1    TOTAL SCORE: 9/40    TODAY'S TREATMENT:   Following dysphagia assessment, SLP targeted functional communication with multimodal supports to elicit personally relevant words and topics. With usual mod cues (writing, alphabet board, gestures, Y/N questions, verbal choice) pt shared details re: his family, personal interests, and upbringing. Responses were word level and ~50% intelligible, though with context closer to 70% intelligible. Pt was able to spell names of family members using alphabet board to clarify when speech was unintelligible, spelling was 90% acc for family names.    PATIENT EDUCATION: Education details: Dysphagia 3/thin, no straw recommendations, aspiration precautions. Use of low-tech letterboard to supplement verbal communication Person educated: Patient Education method: Explanation, Demonstration, and written supports Education comprehension: verbalized understanding and needs further education   GOALS: Goals reviewed with patient? Yes  SHORT TERM GOALS: Target date: 10 sessions  Patient will participate in clinical assessment of swallow function with goals added as needed. Baseline: Goal status: MET  2.  Pt will communicate emergency information 100% accuracy using visual aid if necessary.  Baseline:  Goal status: INITIAL  3.  Pt will approximate personally relevant words and phrases >80% accuracy using script training and min visual cues for apraxia.  Baseline:  Goal status: INITIAL  4.  Pt will generate at least 4 descriptors of target word 80% of the time using semantic feature analysis to improve abilities in wordfinding and resolving communication breakdowns.  Baseline:  Goal status: INITIAL  5.  Pt will complete HEP for dysphagia with rare min A. Baseline:  Goal status: INITIAL    LONG TERM GOALS: Target date: 02/04/2022  Pt will engage in 5-8 minutes simple-mod complex conversation re: topic of interest with supported conversation, aphasia  compensations.  Baseline:  Goal status: INITIAL  2.  Pt will ID and attempt repair of communication breakdowns >90% of the time in session.   Baseline:  Goal status: INITIAL  3.  Pt/family will demonstrate knowledge of community resources and activities to support language/communication. Baseline:  Goal status: INITIAL  4.  Pt will demonstrate use of swallowing precautions independently with s/sx aspiration <5% of trials. Baseline:  Goal status: INITIAL   ASSESSMENT:  CLINICAL IMPRESSION: Patient presents with moderate Broca's aphasia, moderate verbal apraxia, dysarthria (difficult to determine extent due to language deficits and apraxia), and mild oropharyngeal dysphagia. Pt exhibited s/sx aspiration with larger sips of thin liquid or when oral cavity was not completely clear before sipping water. Would benefit from dysphagia HEP program as well as training/reinforcement of aspiration precautions. Goals updated. Pt demonstrates emerging abilities to use alphabet board to supplement verbal speech to spell out single words and indicate numbers. Patient demonstrates good use of nonverbals and Y/N responses, however is significantly limited in his ability to relay thoughts, feelings, and non-contextual or abstract information. I recommend skilled ST to improve pt's swallowing, language and motor speech skills to increase swallowing safety, improve communication and reduce frustration.   OBJECTIVE IMPAIRMENTS include expressive language, receptive language, aphasia, apraxia, dysarthria, and dysphagia. These impairments are limiting patient from managing medications, managing appointments, managing finances, household responsibilities, ADLs/IADLs, effectively communicating at home and in community, and safety when swallowing. Factors affecting potential to achieve goals and functional outcome are cooperation/participation level; pt appears highly motivated with good family  support. Patient will  benefit from skilled SLP services to address above impairments and improve overall function.  REHAB POTENTIAL: Excellent  PLAN: SLP FREQUENCY: 2x/week  SLP DURATION: 12 weeks  PLANNED INTERVENTIONS: Aspiration precaution training, Pharyngeal strengthening exercises, Diet toleration management , Language facilitation, Environmental controls, Trials of upgraded texture/liquids, Cognitive reorganization, Internal/external aids, Functional tasks, Multimodal communication approach, SLP instruction and feedback, Compensatory strategies, and Patient/family education    [name], MS, Actor [phone]

## 2021-11-14 ENCOUNTER — Encounter: Payer: Self-pay | Admitting: Occupational Therapy

## 2021-11-14 ENCOUNTER — Other Ambulatory Visit: Payer: Self-pay | Admitting: Cardiovascular Disease

## 2021-11-14 ENCOUNTER — Ambulatory Visit: Payer: Medicare Other | Admitting: Occupational Therapy

## 2021-11-14 DIAGNOSIS — M6281 Muscle weakness (generalized): Secondary | ICD-10-CM

## 2021-11-14 NOTE — Therapy (Addendum)
OUTPATIENT OCCUPATIONAL THERAPY NEURO TREATMENT  Patient Name: Gerald Bauer MRN: [id number] DOB:09/20/1945, 76 y.o., male Today's Date: 11/14/2021  PCP: Threasa Alpha, PA REFERRING PROVIDER: Reesa Chew   OT End of Session - 11/14/21 1105     Visit Number 3    Number of Visits 24    Date for OT Re-Evaluation 01/29/22    OT Start Time 1000    OT Stop Time 30    OT Time Calculation (min) 45 min    Activity Tolerance Patient tolerated treatment well    Behavior During Therapy [facility] for tasks assessed/performed            Past Medical History:  Diagnosis Date   Acid reflux    Arthritis    hands   Carotid atherosclerosis    Diabetes mellitus without complication ([place])    Ganglion cyst 02/23/2015   Hyperlipemia    Hypertension    Joint pain in fingers of right hand 02/23/2015   Dominant hand   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 07/11/2015   Prostate cancer ([place])    history   Stroke (cerebrum) ([place]) 10/06/2021   Tachycardia    Tinea pedis of both feet 01/10/2017   Past Surgical History:  Procedure Laterality Date   [place]  2013   PROSTATECTOMY  2012   Patient Active Problem List   Diagnosis Date Noted   Hemiparesis, aphasia, and dysphagia as late effects of stroke ([place]) 11/07/2021   Lower urinary tract symptoms (LUTS) 11/07/2021   Myalgia due to statin 02/21/2021   Type 2 diabetes mellitus without complication, without long-term current use of insulin ([place]) 02/21/2021   Aortic atherosclerosis ([place]) 04/27/2020   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   Onychomycosis of multiple toenails with type 2 diabetes mellitus ([place]) 07/19/2016   Carotid stenosis 03/13/2016   Hyperlipidemia associated with type 2 diabetes mellitus ([place])    Hypertension associated with type 2 diabetes mellitus ([place])    Personal history of prostate cancer    Carotid atherosclerosis    GERD (gastroesophageal  reflux disease) 08/20/2013    ONSET DATE: 10/05/21  REFERRING DIAG: L MCA CVA  THERAPY DIAG:  Muscle weakness (generalized)  Rationale for Evaluation and Treatment Rehabilitation  SUBJECTIVE:   SUBJECTIVE STATEMENT: Pt wanted to try therapy by himself today while family member waited in the lobby.  PERTINENT HISTORY: Per chart, Gerald Bauer is a 76 year old right-handed male with history of hypertension, hyperlipidemia, diabetes mellitus, prostate cancer/prostatectomy 2012, quit smoking 24 years ago.  Presented to [facility] 10/05/2021 with right side weakness/facial droop and expressive aphasia.  Blood pressure 155/102.  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  No associated hemorrhage or significant mass effect.    PAIN:  Are you having pain? Yes: Pain location: R/L, right wrist shoulders 5/10 pain Pain description: achy, sore, stiff Aggravating factors: movement Relieving factors: rest  FALLS: Has patient fallen in last 6 months? Yes. Number of falls 3 since CVA  PLOF: Independent/retired Dealer  PATIENT GOALS : Pt points to his hand (wants to be able to use his hand)  OBJECTIVE:   HAND DOMINANCE: Right   FUNCTIONAL OUTCOME MEASURES: FOTO: 48  UPPER EXTREMITY ROM     Active ROM Right eval Left Eval WNL  Shoulder flexion 90 (135)   Shoulder abduction 95 (110)    UPPER EXTREMITY MMT:     MMT  Right eval Left Eval 5/5  Shoulder flexion 3-   Shoulder abduction 3-   Shoulder adduction    Shoulder extension    Shoulder internal rotation 3-   Shoulder external rotation 3-   Middle trapezius    Lower trapezius    Elbow flexion 4-   Elbow extension 4+   Wrist flexion 4   Wrist extension 4-   (Blank rows = not tested)  HAND FUNCTION: Grip strength: Right: 14 lbs; Left: 58 lbs, Lateral pinch: Right: 7 lbs, Left: 19 lbs, and 3 point pinch: Right: 2 lbs, Left: 16 lbs  COORDINATION: Finger Nose Finger test:  difficult/lacks precision with reaching targets 9 Hole Peg test: Right: unable sec; Left: 27 sec Able to oppose 2nd and 3rd digits to thumb on R hand  TODAY'S TREATMENT:   Therapeutic Exercise:  Pt. performed right shoulder stabilization exercises in supine with the shoulder flexed to 90 degrees, and the elbow extended. Pt. Was able to perform shoulder stabilization with perturbations, perform shoulder protraction, and perform circular motion. Pt. Performed 2# dowel with BUEs to perform chest press, shoulder flex in supine with min guard for support of the RUE through controlled and maximal ROM arc. Pt. performed resistive EZ Board exercises for forearm supination/pronation using gross grasp, and lateral pinch (key) grasp.  Pt. Worked on pinch strengthening in the right hand for lateral, and 3pt. pinch using yellow, red, and green resistive clips. Pt. worked on placing the clips at various vertical and horizontal angles. Tactile and verbal cues were required for eliciting the desired movement.   Pt. was able to perform the right shoulder stabilization exercises in supine. Pt. required support for the right hand with the dowel exercises. Pt. required tactile, verabl cues, and cues for visual demonstration for motor planning through the movements needed to formulate 3pt. Pinch position with the resistive clips, and lateral grasp on the small EZ board attachment. Pt. Continues to work on improving RUE functioning in order to improve, and maximize independence with ADLs, and IADL tasks.      PATIENT EDUCATION: Education details: HEP progression Person educated: pt Education method: Explanation and Verbal cues, handout Education comprehension: verbalized understanding, returned demo   HOME EXERCISE PROGRAM: theraputty  GOALS: Goals reviewed with patient? Yes  SHORT TERM GOALS: Target date: 12/18/21  Pt will be indep to perform HEP for increasing strength and coordination throughout RUE.   Baseline: Not yet initiated Goal status: INITIAL  2.  Pt will manage clothing fasteners with extra time, using RUE as an assist  Baseline: unable; pt has elastic laces and wears elastic waisted pants. Goal status: INITIAL  LONG TERM GOALS: Target date: 01/29/22  Pt will increase FOTO score to 55 or better to indicate improved performance with daily tasks.  Baseline: 48 Goal status: INITIAL  2.  Pt will increase R grip strength by 10 or more lbs to enable pt to hold and carry light ADL supplies without dropping.  Baseline: R grip 14#, L 58# Goal status: INITIAL  3.  Pt will increase RUE strength to be able to engage RUE into ADLs at least 50% of the time. Baseline: Pt using L non-dominant arm to manage ADLs. Goal status: INITIAL  4.  Pt will complete 9 hole peg test on the R in 3 min or less to work towards ability to use R hand to pick up small ADL supplies.  Baseline: Pt can remove a peg but can not pick one up Goal status: INITIAL  ASSESSMENT:  CLINICAL IMPRESSION:   Pt. was able to perform the right shoulder stabilization exercises in supine. Pt. required support for the right hand with the dowel exercises. Pt. required tactile, verabl cues, and cues for visual demonstration for motor planning through the movements needed to formulate 3pt. Pinch position with the resistive clips, and lateral grasp on the small EZ board attachment. Pt. Continues to work on improving RUE functioning in order to improve, and maximize independence with ADLs, and IADL tasks.  PERFORMANCE DEFICITS in functional skills including ADLs, IADLs, coordination, dexterity, ROM, strength, pain, flexibility, FMC, GMC, mobility, balance, continence, decreased knowledge of use of DME, and UE functional use, cognitive skills including safety awareness, and psychosocial skills including.   IMPAIRMENTS are limiting patient from ADLs, IADLs, leisure, and social participation.   COMORBIDITIES may have  co-morbidities  that affects occupational performance. Patient will benefit from skilled OT to address above impairments and improve overall function.  MODIFICATION OR ASSISTANCE TO COMPLETE EVALUATION: Min-Moderate modification of tasks or assist with assess necessary to complete an evaluation.  OT OCCUPATIONAL PROFILE AND HISTORY: Problem focused assessment: Including review of records relating to presenting problem.  CLINICAL DECISION MAKING: Moderate - several treatment options, min-mod task modification necessary  REHAB POTENTIAL: Good  EVALUATION COMPLEXITY: Moderate    PLAN: OT FREQUENCY: 2x/week  OT DURATION: 12 weeks  PLANNED INTERVENTIONS: self care/ADL training, therapeutic exercise, therapeutic activity, neuromuscular re-education, manual therapy, passive range of motion, balance training, functional mobility training, moist heat, cryotherapy, patient/family education, cognitive remediation/compensation, energy conservation, coping strategies training, and DME and/or AE instructions  RECOMMENDED OTHER SERVICES: N/A  CONSULTED AND AGREED WITH PLAN OF CARE: Patient and family member/caregiver  PLAN FOR NEXT SESSION: HEP progression, neuro re-ed, therapeutic exercises  Leta Speller, MS, OTR/L  Harrel Carina, OT 11/14/2021, 11:08 AM

## 2021-11-16 ENCOUNTER — Ambulatory Visit: Payer: Medicare Other | Admitting: Occupational Therapy

## 2021-11-16 DIAGNOSIS — M6281 Muscle weakness (generalized): Secondary | ICD-10-CM

## 2021-11-16 NOTE — Therapy (Signed)
OUTPATIENT OCCUPATIONAL THERAPY NEURO TREATMENT  Patient Name: Gerald Bauer MRN: [id number] DOB:Dec 07, 1945, 76 y.o., male Today's Date: 11/16/2021  PCP: Threasa Alpha, PA REFERRING PROVIDER: Reesa Chew   OT End of Session - 11/16/21 1202     Visit Number 4    Number of Visits 24    Date for OT Re-Evaluation 01/29/22    OT Start Time 1148    OT Stop Time 79    OT Time Calculation (min) 42 min    Activity Tolerance Patient tolerated treatment well    Behavior During Therapy [facility] for tasks assessed/performed            Past Medical History:  Diagnosis Date   Acid reflux    Arthritis    hands   Carotid atherosclerosis    Diabetes mellitus without complication ([place])    Ganglion cyst 02/23/2015   Hyperlipemia    Hypertension    Joint pain in fingers of right hand 02/23/2015   Dominant hand   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 07/11/2015   Prostate cancer ([place])    history   Stroke (cerebrum) ([place]) 10/06/2021   Tachycardia    Tinea pedis of both feet 01/10/2017   Past Surgical History:  Procedure Laterality Date   [place]  2013   PROSTATECTOMY  2012   Patient Active Problem List   Diagnosis Date Noted   Hemiparesis, aphasia, and dysphagia as late effects of stroke ([place]) 11/07/2021   Lower urinary tract symptoms (LUTS) 11/07/2021   Myalgia due to statin 02/21/2021   Type 2 diabetes mellitus without complication, without long-term current use of insulin ([place]) 02/21/2021   Aortic atherosclerosis ([place]) 04/27/2020   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 Continues to want to try therapy by himself while family members wait in the lobby.  PERTINENT HISTORY: Per chart, Gerald Bauer is a 76 year old right-handed male with history of hypertension, hyperlipidemia, diabetes mellitus, prostate cancer/prostatectomy 2012, quit smoking 24 years ago.  Presented to [facility] 10/05/2021 with right side weakness/facial droop and expressive aphasia.  Blood pressure 155/102.  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  No associated hemorrhage or significant mass effect.    PAIN:  Are you having pain? Yes: Pain location: L, shoulder 5/10 pain Pain description: achy, sore, stiff Aggravating factors: movement Relieving factors: rest  FALLS: Has patient fallen in last 6 months? Yes. Number of falls 3 since CVA  PLOF: Independent/retired Dealer  PATIENT GOALS : Pt points to his hand (wants to be able to use his hand)  OBJECTIVE:   HAND DOMINANCE: Right   FUNCTIONAL OUTCOME MEASURES: FOTO: 48  UPPER EXTREMITY ROM     Active ROM Right eval Left Eval WNL  Shoulder flexion 90 (135)   Shoulder abduction 95 (110)    UPPER EXTREMITY MMT:     MMT Right  eval Left Eval 5/5  Shoulder flexion 3-   Shoulder abduction 3-   Shoulder adduction    Shoulder extension    Shoulder internal rotation 3-   Shoulder external rotation 3-   Middle trapezius    Lower trapezius    Elbow flexion 4-   Elbow extension 4+   Wrist flexion 4   Wrist extension 4-   (Blank rows = not tested)  HAND FUNCTION: Grip strength: Right: 14 lbs; Left: 58 lbs, Lateral pinch: Right: 7 lbs, Left: 19 lbs, and 3 point pinch: Right: 2 lbs, Left: 16 lbs  COORDINATION: Finger Nose Finger test: difficult/lacks  precision with reaching targets 9 Hole Peg test: Right: unable sec; Left: 27 sec Able to oppose 2nd and 3rd digits to thumb on R hand  TODAY'S TREATMENT:   Therapeutic Exercise:  Pt. Worked on BUE strengthening, and reciprocal motion using the UBE while seated for 8 min. With minimal resistance. Constant monitoring was provided.  Pt. Performed 1.5# dowel  while seated with BUEs to perform chest press, shoulder flexion, and attempted circular motion. Pt. worked on reaching using the [employer]. Pt. Grasped and moved the shapes through the  1st vertical dowel in ai series 4 progressively increasing dowels. Pt. worked on pinch strengthening in the left hand for lateral, and 3pt. pinch using yellow, red, and green resistive clips. Pt. Worked on grasping the resistive clips from the tabletop surface with his right hand only. Pt. worked on placing the clips on a horizontal dowel placed at  the tabletop.  Tactile and verbal cues were required for eliciting the desired movement.   Pt. required minimal support for the right hand with the dowel exercises.  Pt. Was able to maintain his right hand on the UBE handle throughout the full duration of the task. Pt was able to initiate changing the direction every 1 min. On the UBE. Pt. required tactile, verabl cues, and cues for visual demonstration for motor planning through the movements needed to formulate 3pt. Pinch position with the resistive clips. Pt. required fewer cues to perform lateral pinch position on the clips. Pt. Required cues to avoid engaging the left hand to assist the right. Pt. Continues to work on improving RUE functioning in order to improve engagement in, and maximize independence with ADLs, and IADL tasks.      PATIENT EDUCATION: Education details: HEP progression Person educated: pt Education method: Explanation and Verbal cues, handout Education comprehension: verbalized understanding, returned demo   HOME EXERCISE  PROGRAM: theraputty  GOALS: Goals reviewed with patient? Yes  SHORT TERM GOALS: Target date: 12/18/21  Pt will be indep to perform HEP for increasing strength and coordination throughout RUE.  Baseline: Not yet initiated Goal status: INITIAL  2.  Pt will manage clothing fasteners with extra time, using RUE as an assist  Baseline: unable; pt has elastic laces and wears elastic waisted pants. Goal status: INITIAL  LONG TERM GOALS: Target date: 01/29/22  Pt will increase FOTO score to 55 or better to indicate improved performance with daily tasks.  Baseline: 48 Goal status: INITIAL  2.  Pt will increase R grip strength by 10 or more lbs to enable pt to hold and carry light ADL supplies without dropping.  Baseline: R grip 14#, L 58# Goal status: INITIAL  3.  Pt will increase RUE strength to be able to engage RUE into ADLs at least 50% of the time. Baseline: Pt using L non-dominant arm to manage ADLs. Goal status: INITIAL  4.  Pt will complete 9 hole peg test on the R in 3 min or less to work towards ability to use R hand to pick up small ADL supplies.  Baseline: Pt can remove a peg but can not pick one up Goal status: INITIAL   ASSESSMENT:  CLINICAL IMPRESSION:   Pt. required minimal support for the right hand with the dowel exercises.  Pt. Was able to maintain his right hand on the UBE handle throughout the full duration of the task. Pt was able to initiate changing the direction every 1 min. On the UBE. Pt. required tactile, verabl cues, and cues for visual demonstration for motor planning through the movements needed to formulate 3pt. Pinch position with the resistive clips. Pt. required fewer cues to perform lateral pinch position on the clips. Pt. Required cues to avoid engaging the left hand to assist the right. Pt. Continues to work on improving RUE functioning in order to improve engagement in, and maximize independence with ADLs, and IADL tasks.  PERFORMANCE DEFICITS in  functional skills including ADLs, IADLs, coordination, dexterity, ROM, strength, pain, flexibility, FMC, GMC, mobility, balance, continence, decreased knowledge of use of DME, and UE functional use, cognitive skills including safety awareness, and psychosocial skills including.   IMPAIRMENTS are limiting patient from ADLs, IADLs, leisure, and social participation.   COMORBIDITIES may have co-morbidities  that affects occupational performance. Patient will benefit from skilled OT to address above impairments and improve overall function.  MODIFICATION OR ASSISTANCE TO COMPLETE EVALUATION: Min-Moderate modification of tasks or assist with assess necessary to complete an evaluation.  OT OCCUPATIONAL PROFILE AND HISTORY: Problem focused assessment: Including review of records relating to presenting problem.  CLINICAL DECISION MAKING: Moderate - several treatment options, min-mod task modification necessary  REHAB POTENTIAL: Good  EVALUATION COMPLEXITY: Moderate    PLAN: OT FREQUENCY: 2x/week  OT DURATION: 12 weeks  PLANNED INTERVENTIONS: self care/ADL training, therapeutic exercise, therapeutic activity, neuromuscular re-education, manual therapy, passive range of motion, balance training, functional mobility training, moist heat, cryotherapy, patient/family education, cognitive remediation/compensation, energy conservation, coping strategies training, and DME and/or AE instructions  RECOMMENDED OTHER SERVICES: N/A  CONSULTED AND AGREED WITH PLAN OF CARE: Patient and family member/caregiver  PLAN FOR NEXT SESSION: HEP progression, neuro re-ed, therapeutic exercises  Harrel Carina, MS, OTR/L   Harrel Carina, OT 11/16/2021, 12:03 PM

## 2021-11-20 ENCOUNTER — Ambulatory Visit: Payer: Medicare Other | Admitting: Occupational Therapy

## 2021-11-20 ENCOUNTER — Ambulatory Visit: Payer: Medicare Other | Admitting: Speech Pathology

## 2021-11-20 DIAGNOSIS — M6281 Muscle weakness (generalized): Secondary | ICD-10-CM

## 2021-11-20 DIAGNOSIS — R4701 Aphasia: Secondary | ICD-10-CM

## 2021-11-20 DIAGNOSIS — R482 Apraxia: Secondary | ICD-10-CM

## 2021-11-20 NOTE — Patient Instructions (Addendum)
Apraxia of Speech Practice- Go slow. It might help to try in front of a mirror.    MA                           MOM      ME                           MIME MY                           MAME MAY                         MOP    MOO                       MOB MOW                       MAP   PA                           PAM PAY                        POP  PIE                         POPE P                            PIPE POOH                    PEP  DAY                      DAD DYE                      DID DO                        DOT DOE                     DEBT                              DEAD                                  TIE                       TIGHT TEA                      TIDE TOE                      TOAD TO                         TAD  TOT  FEE                       FAVE FIE                         FIVE FO                          FOOD FUM                       FALL FOO  VET VAN VOTE VINE VAT  Personal words and phrases: (Keep adding your own to the list)  [facility] [place] [employer]     [employer] Double Cheeseburger Number one combo  Diet [place] shrimp  [place] slaw Baked potato [employer] [address] [place]

## 2021-11-20 NOTE — Therapy (Signed)
OUTPATIENT OCCUPATIONAL THERAPY NEURO TREATMENT  Patient Name: Gerald Bauer MRN: [id number] DOB:06/11/1945, 76 y.o., male Today's Date: 11/20/2021  PCP: Threasa Alpha, PA REFERRING PROVIDER: Reesa Chew   OT End of Session - 11/20/21 1321     Visit Number 5    Number of Visits 24    Date for OT Re-Evaluation 01/29/22    OT Start Time 64    OT Stop Time 1145    OT Time Calculation (min) 45 min    Activity Tolerance Patient tolerated treatment well    Behavior During Therapy [facility] for tasks assessed/performed            Past Medical History:  Diagnosis Date   Acid reflux    Arthritis    hands   Carotid atherosclerosis    Diabetes mellitus without complication ([place])    Ganglion cyst 02/23/2015   Hyperlipemia    Hypertension    Joint pain in fingers of right hand 02/23/2015   Dominant hand   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 07/11/2015   Prostate cancer ([place])    history   Stroke (cerebrum) ([place]) 10/06/2021   Tachycardia    Tinea pedis of both feet 01/10/2017   Past Surgical History:  Procedure Laterality Date   [place]  2013   PROSTATECTOMY  2012   Patient Active Problem List   Diagnosis Date Noted   Hemiparesis, aphasia, and dysphagia as late effects of stroke ([place]) 11/07/2021   Lower urinary tract symptoms (LUTS) 11/07/2021   Myalgia due to statin 02/21/2021   Type 2 diabetes mellitus without complication, without long-term current use of insulin ([place]) 02/21/2021   Aortic atherosclerosis ([place]) 04/27/2020   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   Onychomycosis of multiple toenails with type 2 diabetes mellitus ([place]) 07/19/2016   Carotid stenosis 03/13/2016   Hyperlipidemia associated with type 2 diabetes mellitus ([place])    Hypertension associated with type 2 diabetes mellitus ([place])    Personal history of prostate cancer    Carotid atherosclerosis    GERD (gastroesophageal  reflux disease) 08/20/2013    ONSET DATE: 10/05/21  REFERRING DIAG: L MCA CVA  THERAPY DIAG:  Muscle weakness (generalized)  Rationale for Evaluation and Treatment Rehabilitation  SUBJECTIVE:   SUBJECTIVE STATEMENT:     PERTINENT HISTORY: Per chart, [name] is a 76 year old right-handed male with history of hypertension, hyperlipidemia, diabetes mellitus, prostate cancer/prostatectomy 2012, quit smoking 24 years ago.  Presented to [facility] 10/05/2021 with right side weakness/facial droop and expressive aphasia.  Blood pressure 155/102.  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  No associated hemorrhage or significant mass effect.    PAIN:  Are you having pain? Yes: Pain location: L, shoulder 5/10 pain Pain description: achy, sore, stiff Aggravating factors: movement Relieving factors: rest  FALLS: Has patient fallen in last 6 months? Yes. Number of falls 3 since CVA  PLOF: Independent/retired Dealer  PATIENT GOALS : Pt points to his hand (wants to be able to use his hand)  OBJECTIVE:   HAND DOMINANCE: Right   FUNCTIONAL OUTCOME MEASURES: FOTO: 48  UPPER EXTREMITY ROM     Active ROM Right eval Left Eval WNL  Shoulder flexion 90 (135)   Shoulder abduction 95 (110)    UPPER EXTREMITY MMT:     MMT Right eval Left Eval 5/5  Shoulder flexion 3-   Shoulder abduction 3-  Shoulder adduction    Shoulder extension    Shoulder internal rotation 3-   Shoulder external rotation 3-   Middle trapezius    Lower trapezius    Elbow flexion 4-   Elbow extension 4+   Wrist flexion 4   Wrist extension 4-   (Blank rows = not tested)  HAND FUNCTION: Grip strength: Right: 14 lbs; Left: 58 lbs, Lateral pinch: Right: 7 lbs, Left: 19 lbs, and 3 point pinch: Right: 2 lbs, Left: 16 lbs  COORDINATION: Finger Nose Finger test: difficult/lacks precision with reaching targets 9 Hole Peg test: Right: unable sec; Left: 27  sec Able to oppose 2nd and 3rd digits to thumb on R hand  TODAY'S TREATMENT:   Therapeutic Exercise:  Pt. worked on [employer], and reciprocal motion using the UBE while seated for 8 min. With minimal resistance. Constant monitoring was provided. Pt. performed gross gripping with a gross grip strengthener. Pt. worked on sustaining grip while grasping pegs and placing them into a container placed at the tabletop surface, to the right. The Gripper was set to   6.6 # of grip strength resistance.  Pt. worked on [employer] in the hand for lateral, and 3pt. pinch using yellow, red, and green resistive clips. Pt. Worked on grasping the resistive clips from the tabletop surface with his right hand only pt. worked on moving through his hand into pinch position. Pt. worked on placing the clips on a horizontal dowel placed at  the tabletop.  Tactile and verbal cues were required for eliciting the desired movement.   Pt. required minimal support for the right hand with the dowel exercises.  Pt. was able to maintain his right hand on the UBE handle throughout the full duration of the task. Pt continues to be able to initiate changing the direction every 1 min. On the UBE. Pt. required tactile, verabl cues, and cues for visual demonstration for motor planning through the movements needed to formulate 3pt. Pinch position with the resistive clips. Pt. required fewer cues to perform lateral pinch position on the clips. Pt.required fewer cues to avoid engaging the left hand to assist the right hand during the task. Pt. Continues to work on improving RUE functioning in order to improve engagement in, and maximize independence with ADLs, and IADL tasks.      PATIENT EDUCATION: Education details: HEP progression Person educated: pt Education method: Explanation and Verbal cues, handout Education comprehension: verbalized understanding, returned demo   HOME EXERCISE  PROGRAM: theraputty  GOALS: Goals reviewed with patient? Yes  SHORT TERM GOALS: Target date: 12/18/21  Pt will be indep to perform HEP for increasing strength and coordination throughout RUE.  Baseline: Not yet initiated Goal status: INITIAL  2.  Pt will manage clothing fasteners with extra time, using RUE as an assist  Baseline: unable; pt has elastic laces and wears elastic waisted pants. Goal status: INITIAL  LONG TERM GOALS: Target date: 01/29/22  Pt will increase FOTO score to 55 or better to indicate improved performance with daily tasks.  Baseline: 48 Goal status: INITIAL  2.  Pt will increase R grip strength by 10 or more lbs to enable pt to hold and carry light ADL supplies without dropping.  Baseline: R grip 14#, L 58# Goal status: INITIAL  3.  Pt will increase RUE strength to be able to engage RUE into ADLs at least 50% of the time. Baseline: Pt using L non-dominant arm to manage ADLs. Goal status: INITIAL  4.  Pt will complete 9 hole peg test on the R in 3 min or less to work towards ability to use R hand to pick up small ADL supplies.  Baseline: Pt can remove a peg but can not pick one up Goal status: INITIAL   ASSESSMENT:  CLINICAL IMPRESSION:   Pt. required minimal support for the right hand with the dowel exercises.  Pt. was able to maintain his right hand on the UBE handle throughout the full duration of the task. Pt continues to be able to initiate changing the direction every 1 min. On the UBE. Pt. required tactile, verabl cues, and cues for visual demonstration for motor planning through the movements needed to formulate 3pt. Pinch position with the resistive clips. Pt. required fewer cues to perform lateral pinch position on the clips. Pt.required fewer cues to avoid engaging the left hand to assist the right hand during the task. Pt. Continues to work on improving RUE functioning in order to improve engagement in, and maximize independence with ADLs, and  IADL tasks.   PERFORMANCE DEFICITS in functional skills including ADLs, IADLs, coordination, dexterity, ROM, strength, pain, flexibility, FMC, GMC, mobility, balance, continence, decreased knowledge of use of DME, and UE functional use, cognitive skills including safety awareness, and psychosocial skills including.   IMPAIRMENTS are limiting patient from ADLs, IADLs, leisure, and social participation.   COMORBIDITIES may have co-morbidities  that affects occupational performance. Patient will benefit from skilled OT to address above impairments and improve overall function.  MODIFICATION OR ASSISTANCE TO COMPLETE EVALUATION: Min-Moderate modification of tasks or assist with assess necessary to complete an evaluation.  OT OCCUPATIONAL PROFILE AND HISTORY: Problem focused assessment: Including review of records relating to presenting problem.  CLINICAL DECISION MAKING: Moderate - several treatment options, min-mod task modification necessary  REHAB POTENTIAL: Good  EVALUATION COMPLEXITY: Moderate    PLAN: OT FREQUENCY: 2x/week  OT DURATION: 12 weeks  PLANNED INTERVENTIONS: self care/ADL training, therapeutic exercise, therapeutic activity, neuromuscular re-education, manual therapy, passive range of motion, balance training, functional mobility training, moist heat, cryotherapy, patient/family education, cognitive remediation/compensation, energy conservation, coping strategies training, and DME and/or AE instructions  RECOMMENDED OTHER SERVICES: N/A  CONSULTED AND AGREED WITH PLAN OF CARE: Patient and family member/caregiver  PLAN FOR NEXT SESSION: HEP progression, neuro re-ed, therapeutic exercises  Harrel Carina, MS, OTR/L   Harrel Carina, OT 11/20/2021, 1:24 PM

## 2021-11-21 ENCOUNTER — Encounter: Payer: Medicare Other | Admitting: Occupational Therapy

## 2021-11-21 NOTE — Therapy (Signed)
OUTPATIENT SPEECH LANGUAGE PATHOLOGY TREATMENT   Patient Name: Gerald Bauer MRN: [id number] DOB:[birth year], 76 y.o., male Today's Date: 11/21/2021  PCP: [name], PA-C REFERRING PROVIDER: Reesa Chew, PA-C   End of Session - 11/21/21 1050     Visit Number 3    Number of Visits 25    Date for SLP Re-Evaluation 02/04/22    SLP Start Time 1000    SLP Stop Time  1100    SLP Time Calculation (min) 60 min    Activity Tolerance Patient tolerated treatment well             Past Medical History:  Diagnosis Date   Acid reflux    Arthritis    hands   Carotid atherosclerosis    Diabetes mellitus without complication ([place])    Ganglion cyst 02/23/2015   Hyperlipemia    Hypertension    Joint pain in fingers of right hand 02/23/2015   Dominant hand   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 07/11/2015   Prostate cancer ([place])    history   Stroke (cerebrum) ([place]) 10/06/2021   Tachycardia    Tinea pedis of both feet 01/10/2017   Past Surgical History:  Procedure Laterality Date   [place]  2013   PROSTATECTOMY  2012   Patient Active Problem List   Diagnosis Date Noted   Hemiparesis, aphasia, and dysphagia as late effects of stroke ([place]) 11/07/2021   Lower urinary tract symptoms (LUTS) 11/07/2021   Myalgia due to statin 02/21/2021   Type 2 diabetes mellitus without complication, without long-term current use of insulin ([place]) 02/21/2021   Aortic atherosclerosis ([place]) 04/27/2020   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   Onychomycosis of multiple toenails with type 2 diabetes mellitus ([place]) 07/19/2016   Carotid stenosis 03/13/2016   Hyperlipidemia associated with type 2 diabetes mellitus ([place])    Hypertension associated with type 2 diabetes mellitus ([place])    Personal history of prostate cancer    Carotid atherosclerosis    GERD (gastroesophageal reflux disease) 08/20/2013    ONSET DATE:  10/05/2021   REFERRING DIAG: Cerebral infarction due to unspecified occlusion of left middle cerebral artery  THERAPY DIAG:  Verbal apraxia  Aphasia  Rationale for Evaluation and Treatment Rehabilitation  SUBJECTIVE:   SUBJECTIVE STATEMENT: Pt completed TalkPath activities on his iPad daily.  Pt accompanied by: [name] waited in waiting area  PERTINENT HISTORY: Pt  is a 76 year old male with history of left-sided carotid stenosis, non-insulin-dependent diabetes mellitus type 2, PAD, GERD, mild COPD, history of tobacco use, hyperlipidemia, hypertension, who presented 10/05/21 to [facility] ED with facial droop, expressive aphasia, and R sided weakness and found to have left MCA CVA. He was admitted to [facility] 7/17-10/31/21. Advanced to dysphagia 3/thin liquids by cup by time of d/c from CIR. 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 Difficult to fully assess cognition given severity of linguistic impairment; however, does present with decreased safety awareness, diminished problem-solving, emergent awareness, and mild impulsivity with movement."   DIAGNOSTIC FINDINGS: MBS 10/19/21: "Pt presents with overall mild oropharyngeal dysphagia. Oral phase c/b piecemeal deglutition, decreased mastication, decreased bolus cohesion, premature spillage, and weak lingual manipulation. Pharyngeal phase c/b reduced pharyngeal peristalsis + reduced BOT approximation to PPW, which results in mild to moderate vallecular and pyriform sinuses residuals (vallecula > pyriform sinuses for solids,  pyriform sinuses > vallecula for liquids). No penetration nor aspiration appreciated with thin liquid via cup, Dysphagia 1 (puree), Dysphagia 2 (chopped), Dysphagia 3 (mechanical soft), or with 13 mm Barium tablet placed in puree. Once instance of sensed aspiration with intake of large bolus of thin liquid via straw due to  swallow initiation delay to pyriform sinuses and mistiming of swallow-breath cycle; despite strong reflexive cough aspirant was not fully cleared from trachea. Pt's level of swallow initiation was noted to vary with liquid consumption (over base of epiglottis and at pyriform sinuses) with level of swallow initiation for solids to be consistent at the vallecula. Pharyngeal stasis was partially cleared with reflexive secondary swallow." MRI brain 10/05/21: 1. 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 No associated hemorrhage or significant regional mass effect. 2. Loss of normal flow voids throughout the left ICA and MCA, consistent with slow flow and/or occlusion. 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 3. Underlying mild chronic microvascular ischemic disease. Tiny remote left ACA distribution infarct.   PAIN:  Are you having pain? Yes: NPRS scale: 5/10 Pain location: left shoulder   PATIENT GOALS talk better  OBJECTIVE:   TODAY'S TREATMENT:  SLP facilitated session using short phrases (everyday expressions and questions) of 2-4 words to target verbal apraxia. Pt imitated phrases 60% acc; improves to 80% with simultaneous production (watch me, then say it with me). Worked with pt to establish more personally relevant words and phrases for home practice, including family names, favorite restaurants and foods, locations visited. Pt required occasional mod cues and use of alphabet board to clarify unintelligible responses.    PATIENT EDUCATION: Education details: apraxia HEP Person educated: Patient Education method: Explanation, Demonstration, and written supports Education comprehension: verbalized understanding and needs further education   GOALS: Goals reviewed with patient? Yes  SHORT TERM GOALS: Target date: 10 sessions  Patient will participate in clinical assessment of swallow function with goals  added as needed. Baseline: Goal status: MET  2.  Pt will communicate emergency information 100% accuracy using visual aid if necessary.  Baseline:  Goal status: INITIAL  3.  Pt will approximate personally relevant words and phrases >80% accuracy using script training and min visual cues for apraxia.  Baseline:  Goal status: INITIAL  4.  Pt will generate at least 4 descriptors of target word 80% of the time using semantic feature analysis to improve abilities in wordfinding and resolving communication breakdowns.  Baseline:  Goal status: INITIAL  5.  Pt will complete HEP for dysphagia with rare min A. Baseline:  Goal status: INITIAL    LONG TERM GOALS: Target date: 02/04/2022  Pt will engage in 5-8 minutes simple-mod complex conversation re: topic of interest with supported conversation, aphasia compensations.  Baseline:  Goal status: INITIAL  2.  Pt will ID and attempt repair of communication breakdowns >90% of the time in session.   Baseline:  Goal status: INITIAL  3.  Pt/family will demonstrate knowledge of community resources and activities to support language/communication. Baseline:  Goal status: INITIAL  4.  Pt will demonstrate use of swallowing precautions independently with s/sx aspiration <5% of trials. Baseline:  Goal status: INITIAL   ASSESSMENT:  CLINICAL IMPRESSION: Patient presents with moderate Broca's aphasia, moderate verbal apraxia, dysarthria (difficult to determine extent due to language deficits and apraxia), and mild oropharyngeal dysphagia. Pt able to imitate short phrases with simultaneous production 80% acc today. Pt demonstrates emerging abilities to use alphabet board to  supplement verbal speech to spell out single words and indicate numbers. Patient demonstrates good use of nonverbals and Y/N responses, however is significantly limited in his ability to relay thoughts, feelings, and non-contextual or abstract information. I recommend skilled  ST to improve pt's swallowing, language and motor speech skills to increase swallowing safety, improve communication and reduce frustration.   OBJECTIVE IMPAIRMENTS include expressive language, receptive language, aphasia, apraxia, dysarthria, and dysphagia. These impairments are limiting patient from managing medications, managing appointments, managing finances, household responsibilities, ADLs/IADLs, effectively communicating at home and in community, and safety when swallowing. Factors affecting potential to achieve goals and functional outcome are cooperation/participation level; pt appears highly motivated with good family support. Patient will benefit from skilled SLP services to address above impairments and improve overall function.  REHAB POTENTIAL: Excellent  PLAN: SLP FREQUENCY: 2x/week  SLP DURATION: 12 weeks  PLANNED INTERVENTIONS: Aspiration precaution training, Pharyngeal strengthening exercises, Diet toleration management , Language facilitation, Environmental controls, Trials of upgraded texture/liquids, Cognitive reorganization, Internal/external aids, Functional tasks, Multimodal communication approach, SLP instruction and feedback, Compensatory strategies, and Patient/family education    [name], MS, Actor [phone]

## 2021-11-22 ENCOUNTER — Ambulatory Visit: Payer: Medicare Other | Admitting: Occupational Therapy

## 2021-11-22 ENCOUNTER — Ambulatory Visit: Payer: Medicare Other | Admitting: Speech Pathology

## 2021-11-22 DIAGNOSIS — R4701 Aphasia: Secondary | ICD-10-CM

## 2021-11-22 DIAGNOSIS — M6281 Muscle weakness (generalized): Secondary | ICD-10-CM | POA: Diagnosis not present

## 2021-11-22 DIAGNOSIS — R1312 Dysphagia, oropharyngeal phase: Secondary | ICD-10-CM

## 2021-11-22 DIAGNOSIS — R482 Apraxia: Secondary | ICD-10-CM

## 2021-11-22 NOTE — Therapy (Signed)
OUTPATIENT SPEECH LANGUAGE PATHOLOGY TREATMENT   Patient Name: Gerald Bauer MRN: [id number] DOB:April 02, 1945, 76 y.o., male Today's Date: 11/22/2021  PCP: [name], PA-C REFERRING PROVIDER: Meriam Sprague   End of Session - 11/22/21 1241     Visit Number 4    Number of Visits 25    Date for SLP Re-Evaluation 02/04/22    SLP Start Time 20    SLP Stop Time  1200    SLP Time Calculation (min) 60 min    Activity Tolerance Patient tolerated treatment well             Past Medical History:  Diagnosis Date   Acid reflux    Arthritis    hands   Carotid atherosclerosis    Diabetes mellitus without complication ([place])    Ganglion cyst 02/23/2015   Hyperlipemia    Hypertension    Joint pain in fingers of right hand 02/23/2015   Dominant hand   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 07/11/2015   Prostate cancer ([place])    history   Stroke (cerebrum) ([place]) 10/06/2021   Tachycardia    Tinea pedis of both feet 01/10/2017   Past Surgical History:  Procedure Laterality Date   [place]  2013   PROSTATECTOMY  2012   Patient Active Problem List   Diagnosis Date Noted   Hemiparesis, aphasia, and dysphagia as late effects of stroke (Gerald Bauer) 11/07/2021   Lower urinary tract symptoms (LUTS) 11/07/2021   Myalgia due to statin 02/21/2021   Type 2 diabetes mellitus without complication, without long-term current use of insulin (Gerald Bauer) 02/21/2021   Aortic atherosclerosis ([place]) 04/27/2020   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   Onychomycosis of multiple toenails with type 2 diabetes mellitus (Gerald Bauer) 07/19/2016   Carotid stenosis 03/13/2016   Hyperlipidemia associated with type 2 diabetes mellitus (Gerald Bauer)    Hypertension associated with type 2 diabetes mellitus (Gerald Bauer)    Personal history of prostate cancer    Carotid atherosclerosis    GERD (gastroesophageal reflux disease) 08/20/2013    ONSET DATE:  10/05/2021   REFERRING DIAG: Cerebral infarction due to unspecified occlusion of left middle cerebral artery  THERAPY DIAG:  Verbal apraxia  Aphasia  Dysphagia, oropharyngeal phase  Rationale for Evaluation and Treatment Rehabilitation  SUBJECTIVE:   SUBJECTIVE STATEMENT: "I'm- doing -okay." Pt accompanied by: family member Gerald Bauer waited in waiting area  PERTINENT HISTORY: Pt  is a 76 year old male with history of left-sided carotid stenosis, non-insulin-dependent diabetes mellitus type 2, PAD, GERD, mild COPD, history of tobacco use, hyperlipidemia, hypertension, who presented 10/05/21 to [facility] ED with facial droop, expressive aphasia, and R sided weakness and found to have left MCA CVA. He was admitted to [facility] 7/17-10/31/21. Advanced to dysphagia 3/thin liquids by cup by time of d/c from CIR. 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 Difficult to fully assess cognition given severity of linguistic impairment; however, does present with decreased safety awareness, diminished problem-solving, emergent awareness, and mild impulsivity with movement."   DIAGNOSTIC FINDINGS: MBS 10/19/21: "Pt presents with overall mild oropharyngeal dysphagia. Oral phase c/b piecemeal deglutition, decreased mastication, decreased bolus cohesion, premature spillage, and weak lingual manipulation. Pharyngeal phase c/b reduced pharyngeal peristalsis + reduced BOT approximation to PPW, which results in mild to moderate vallecular and pyriform sinuses residuals (vallecula > pyriform sinuses for solids, pyriform sinuses >  vallecula for liquids). No penetration nor aspiration appreciated with thin liquid via cup, Dysphagia 1 (puree), Dysphagia 2 (chopped), Dysphagia 3 (mechanical soft), or with 13 mm Barium tablet placed in puree. Once instance of sensed aspiration with intake of large bolus of thin liquid via straw due to  swallow initiation delay to pyriform sinuses and mistiming of swallow-breath cycle; despite strong reflexive cough aspirant was not fully cleared from trachea. Pt's level of swallow initiation was noted to vary with liquid consumption (over base of epiglottis and at pyriform sinuses) with level of swallow initiation for solids to be consistent at the vallecula. Pharyngeal stasis was partially cleared with reflexive secondary swallow." MRI brain 10/05/21: 1. 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 No associated hemorrhage or significant regional mass effect. 2. Loss of normal flow voids throughout the left ICA and MCA, consistent with slow flow and/or occlusion. 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 3. Underlying mild chronic microvascular ischemic disease. Tiny remote left ACA distribution infarct.   PAIN:  Are you having pain? Yes: NPRS scale: 5/10 Pain location: left shoulder   PATIENT GOALS talk better  OBJECTIVE:   TODAY'S TREATMENT:  First half of session focused on dysphagia goals. Initiated training in HEP for dysphagia. Pt required visual, demonstration and tactile cues for lingual sweep, elevation. Min cues necessary for pucker-retraction, and mod cues necessary for effortful swallow (x10, with fatigue noted last 2 reps). Provided handouts as well as MedBridge access code. Targeted apraxia and dysarthria with word-level tasks. Pt named less-common objects (approximations) 90% acc, and then was able to imitate correct production 90% acc with min cues. SLP facilitated use of multimodal communication to resolve communication breakdowns with simple description activity (semantic feature analysis). Pt named target 80% acc, and provided 3-4 descriptors with occasional mod cues and supported conversation to clarify responses (first-letter cuing with letterboard, sentence completion).     PATIENT  EDUCATION: Education details: apraxia HEP Person educated: Patient Education method: Explanation, Demonstration, and written supports Education comprehension: verbalized understanding and needs further education   GOALS: Goals reviewed with patient? Yes  SHORT TERM GOALS: Target date: 10 sessions  Patient will participate in clinical assessment of swallow function with goals added as needed. Baseline: Goal status: MET  2.  Pt will communicate emergency information 100% accuracy using visual aid if necessary.  Baseline:  Goal status: INITIAL  3.  Pt will approximate personally relevant words and phrases >80% accuracy using script training and min visual cues for apraxia.  Baseline:  Goal status: INITIAL  4.  Pt will generate at least 4 descriptors of target word 80% of the time using semantic feature analysis to improve abilities in wordfinding and resolving communication breakdowns.  Baseline:  Goal status: INITIAL  5.  Pt will complete HEP for dysphagia with rare min A. Baseline:  Goal status: INITIAL    LONG TERM GOALS: Target date: 02/04/2022  Pt will engage in 5-8 minutes simple-mod complex conversation re: topic of interest with supported conversation, aphasia compensations.  Baseline:  Goal status: INITIAL  2.  Pt will ID and attempt repair of communication breakdowns >90% of the time in session.   Baseline:  Goal status: INITIAL  3.  Pt/family will demonstrate knowledge of community resources and activities to support language/communication. Baseline:  Goal status: INITIAL  4.  Pt will demonstrate use of swallowing precautions independently with s/sx aspiration <5% of trials. Baseline:  Goal status: INITIAL   ASSESSMENT:  CLINICAL IMPRESSION: Patient presents with moderate Broca's aphasia, moderate verbal apraxia, dysarthria (difficult to determine extent due to language deficits and apraxia), and mild oropharyngeal dysphagia. Pt able to generate  single word responses to describe common objects 80% of the time with mod cues. Pt demonstrates emerging abilities to use alphabet board to supplement verbal speech to spell out single words and indicate numbers, however has difficulty with spelling at times. First-letter cuing is highly effective to clarify words (in context) during articulatory breakdowns. Patient demonstrates good use of nonverbals and Y/N responses, however is significantly limited in his ability to relay thoughts, feelings, and non-contextual or abstract information. I recommend skilled ST to improve pt's swallowing, language and motor speech skills to increase swallowing safety, improve communication and reduce frustration.   OBJECTIVE IMPAIRMENTS include expressive language, receptive language, aphasia, apraxia, dysarthria, and dysphagia. These impairments are limiting patient from managing medications, managing appointments, managing finances, household responsibilities, ADLs/IADLs, effectively communicating at home and in community, and safety when swallowing. Factors affecting potential to achieve goals and functional outcome are cooperation/participation level; pt appears highly motivated with good family support. Patient will benefit from skilled SLP services to address above impairments and improve overall function.  REHAB POTENTIAL: Excellent  PLAN: SLP FREQUENCY: 2x/week  SLP DURATION: 12 weeks  PLANNED INTERVENTIONS: Aspiration precaution training, Pharyngeal strengthening exercises, Diet toleration management , Language facilitation, Environmental controls, Trials of upgraded texture/liquids, Cognitive reorganization, Internal/external aids, Functional tasks, Multimodal communication approach, SLP instruction and feedback, Compensatory strategies, and Patient/family education    [name], MS, Actor [phone]

## 2021-11-22 NOTE — Therapy (Signed)
OUTPATIENT OCCUPATIONAL THERAPY NEURO TREATMENT  Patient Name: Gerald Bauer MRN: [id number] DOB:[birth year], 76 y.o., male Today's Date: 11/22/2021  PCP: Threasa Alpha, PA REFERRING PROVIDER: Reesa Chew   OT End of Session - 11/22/21 1221     Visit Number 6    Number of Visits 24    Date for OT Re-Evaluation 01/29/22    OT Start Time 59    OT Stop Time 1100    OT Time Calculation (min) 40 min    Activity Tolerance Patient tolerated treatment well    Behavior During Therapy [facility] for tasks assessed/performed            Past Medical History:  Diagnosis Date   Acid reflux    Arthritis    hands   Carotid atherosclerosis    Diabetes mellitus without complication ([place])    Ganglion cyst 02/23/2015   Hyperlipemia    Hypertension    Joint pain in fingers of right hand 02/23/2015   Dominant hand   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 07/11/2015   Prostate cancer ([place])    history   Stroke (cerebrum) ([place]) 10/06/2021   Tachycardia    Tinea pedis of both feet 01/10/2017   Past Surgical History:  Procedure Laterality Date   [place]  2013   PROSTATECTOMY  2012   Patient Active Problem List   Diagnosis Date Noted   Hemiparesis, aphasia, and dysphagia as late effects of stroke ([place]) 11/07/2021   Lower urinary tract symptoms (LUTS) 11/07/2021   Myalgia due to statin 02/21/2021   Type 2 diabetes mellitus without complication, without long-term current use of insulin ([place]) 02/21/2021   Aortic atherosclerosis ([place]) 04/27/2020   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   Onychomycosis of multiple toenails with type 2 diabetes mellitus ([place]) 07/19/2016   Carotid stenosis 03/13/2016   Hyperlipidemia associated with type 2 diabetes mellitus ([place])    Hypertension associated with type 2 diabetes mellitus ([place])    Personal history of prostate cancer    Carotid atherosclerosis    GERD (gastroesophageal  reflux disease) 08/20/2013    ONSET DATE: 10/05/21  REFERRING DIAG: L MCA CVA  THERAPY DIAG:  Muscle weakness (generalized)  Rationale for Evaluation and Treatment Rehabilitation  SUBJECTIVE:   SUBJECTIVE STATEMENT:     PERTINENT HISTORY: Per chart, [name] is a 76 year old right-handed male with history of hypertension, hyperlipidemia, diabetes mellitus, prostate cancer/prostatectomy 2012, quit smoking 24 years ago.  Presented to [facility]/05/2021 with right side weakness/facial droop and expressive aphasia.  Blood pressure 155/102.  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  No associated hemorrhage or significant mass effect.    PAIN:  Are you having pain? Yes: Pain location: L, shoulder 0/10 pain Pain description:   Aggravating factors:   Relieving factors:    FALLS: Has patient fallen in last 6 months? Yes. Number of falls 3 since CVA  PLOF: Independent/retired Dealer  PATIENT GOALS : Pt points to his hand (wants to be able to use his hand)  OBJECTIVE:   HAND DOMINANCE: Right   FUNCTIONAL OUTCOME MEASURES: FOTO: 48  UPPER EXTREMITY ROM     Active ROM Right eval Left Eval WNL  Shoulder flexion 90 (135)   Shoulder abduction 95 (110)    UPPER EXTREMITY MMT:     MMT Right eval Left Eval 5/5  Shoulder flexion 3-   Shoulder abduction  3-   Shoulder adduction    Shoulder extension    Shoulder internal rotation 3-   Shoulder external rotation 3-   Middle trapezius    Lower trapezius    Elbow flexion 4-   Elbow extension 4+   Wrist flexion 4   Wrist extension 4-   (Blank rows = not tested)  HAND FUNCTION: Grip strength: Right: 14 lbs; Left: 58 lbs, Lateral pinch: Right: 7 lbs, Left: 19 lbs, and 3 point pinch: Right: 2 lbs, Left: 16 lbs  COORDINATION: Finger Nose Finger test: difficult/lacks precision with reaching targets 9 Hole Peg test: Right: unable sec; Left: 27 sec Able to oppose 2nd and 3rd  digits to thumb on R hand  TODAY'S TREATMENT:   Therapeutic Exercise:  Pt. worked on [employer], and reciprocal motion using the UBE while seated for 8 min. With minimal resistance. Constant monitoring was provided. Pt. performed 2# dowel ex. For UE strengthening secondary to weakness. Bilateral shoulder flexion, chest press, circular patterns, and elbow flexion/extension were performed.  Pt. performed gross gripping with a gross grip strengthener. Pt. worked on sustaining grip while grasping pegs and placing them into a container placed at the tabletop surface, to the right. The Gripper was set to 6.6 # of grip strength resistance. Pt. Was able to remove 8 pegs. Pt. worked on grasping 1" resistive cubes alternating thumb opposition to the tip of the 2nd digit while the board is placed flat at the tabletop surface. Pt. worked on pressing the cubes back into place while alternating isolated 2nd digit extension.   Pt. Is making consistent progress. Pt. was able to maintain his right hand on the UBE handle throughout the full duration of the task. Pt continues to be able to initiate changing the direction every 1 min. On the UBE.  Pt. Was able to initiate placing the pegs into the pegboard. Pt. Cleared 4 more pegs than the previous session. Pt. Required  cues for visual demonstration for proper technique. Pt.required fewer cues to avoid engaging the left hand to assist the right hand during the task. Pt. Continues to work on improving RUE functioning in order to improve engagement in, and maximize independence with ADLs, and IADL tasks.      PATIENT EDUCATION: Education details: HEP progression Person educated: pt Education method: Explanation and Verbal cues, handout Education comprehension: verbalized understanding, returned demo   HOME EXERCISE PROGRAM: theraputty  GOALS: Goals reviewed with patient? Yes  SHORT TERM GOALS: Target date: 12/18/21  Pt will be indep to perform HEP for  increasing strength and coordination throughout RUE.  Baseline: Not yet initiated Goal status: INITIAL  2.  Pt will manage clothing fasteners with extra time, using RUE as an assist  Baseline: unable; pt has elastic laces and wears elastic waisted pants. Goal status: INITIAL  LONG TERM GOALS: Target date: 01/29/22  Pt will increase FOTO score to 55 or better to indicate improved performance with daily tasks.  Baseline: 48 Goal status: INITIAL  2.  Pt will increase R grip strength by 10 or more lbs to enable pt to hold and carry light ADL supplies without dropping.  Baseline: R grip 14#, L 58# Goal status: INITIAL  3.  Pt will increase RUE strength to be able to engage RUE into ADLs at least 50% of the time. Baseline: Pt using L non-dominant arm to manage ADLs. Goal status: INITIAL  4.  Pt will complete 9 hole peg test on the R in 3 min or  less to work towards ability to use R hand to pick up small ADL supplies.  Baseline: Pt can remove a peg but can not pick one up Goal status: INITIAL   ASSESSMENT:  CLINICAL IMPRESSION:   Pt. Is making consistent progress. Pt. was able to maintain his right hand on the UBE handle throughout the full duration of the task. Pt continues to be able to initiate changing the direction every 1 min. On the UBE.  Pt. Was able to initiate placing the pegs into the pegboard. Pt. Cleared 4 more pegs than the previous session. Pt. Required  cues for visual demonstration for proper technique. Pt.required fewer cues to avoid engaging the left hand to assist the right hand during the task. Pt. Continues to work on improving RUE functioning in order to improve engagement in, and maximize independence with ADLs, and IADL tasks.   PERFORMANCE DEFICITS in functional skills including ADLs, IADLs, coordination, dexterity, ROM, strength, pain, flexibility, FMC, GMC, mobility, balance, continence, decreased knowledge of use of DME, and UE functional use, cognitive skills  including safety awareness, and psychosocial skills including.   IMPAIRMENTS are limiting patient from ADLs, IADLs, leisure, and social participation.   COMORBIDITIES may have co-morbidities  that affects occupational performance. Patient will benefit from skilled OT to address above impairments and improve overall function.  MODIFICATION OR ASSISTANCE TO COMPLETE EVALUATION: Min-Moderate modification of tasks or assist with assess necessary to complete an evaluation.  OT OCCUPATIONAL PROFILE AND HISTORY: Problem focused assessment: Including review of records relating to presenting problem.  CLINICAL DECISION MAKING: Moderate - several treatment options, min-mod task modification necessary  REHAB POTENTIAL: Good  EVALUATION COMPLEXITY: Moderate    PLAN: OT FREQUENCY: 2x/week  OT DURATION: 12 weeks  PLANNED INTERVENTIONS: self care/ADL training, therapeutic exercise, therapeutic activity, neuromuscular re-education, manual therapy, passive range of motion, balance training, functional mobility training, moist heat, cryotherapy, patient/family education, cognitive remediation/compensation, energy conservation, coping strategies training, and DME and/or AE instructions  RECOMMENDED OTHER SERVICES: N/A  CONSULTED AND AGREED WITH PLAN OF CARE: Patient and family member/caregiver  PLAN FOR NEXT SESSION: HEP progression, neuro re-ed, therapeutic exercises  Harrel Carina, MS, OTR/L   Harrel Carina, OT 11/22/2021, 12:23 PM

## 2021-11-23 NOTE — Therapy (Signed)
OUTPATIENT PHYSICAL THERAPY NEURO EVALUATION   Patient Name: Gerald Bauer MRN: [id number] DOB:[birth year], 76 y.o., male Today's Date: 11/24/2021   PCP: [name], PA-C REFERRING PROVIDER: Bary Leriche, PA-C   PT End of Session - 11/24/21 1121     Visit Number 1    Number of Visits 24    Date for PT Re-Evaluation 02/16/22    Progress Note Due on Visit 10    PT Start Time 0930    PT Stop Time 1030    PT Time Calculation (min) 60 min    Equipment Utilized During Treatment Gait belt    Activity Tolerance Patient tolerated treatment well    Behavior During Therapy [facility] for tasks assessed/performed   Patient has aphasia but is able to respond appropriately to questions.            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 07/11/2015   Prostate cancer ([place])    history   Stroke (cerebrum) ([place]) 10/06/2021   Tachycardia    Tinea pedis of both feet 01/10/2017   Past Surgical History:  Procedure Laterality Date   APPENDECTOMY  1973   CHOLECYSTECTOMY  2013   PROSTATECTOMY  2012   Patient Active Problem List   Diagnosis Date Noted   Hemiparesis, aphasia, and dysphagia as late effects of stroke ([place]) 11/07/2021   Lower urinary tract symptoms (LUTS) 11/07/2021   Myalgia due to statin 02/21/2021   Type 2 diabetes mellitus without complication, without long-term current use of insulin ([place]) 02/21/2021   Aortic atherosclerosis ([place]) 04/27/2020   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   Onychomycosis of multiple toenails with type 2 diabetes mellitus ([place]) 07/19/2016   Carotid stenosis 03/13/2016   Hyperlipidemia associated with type 2 diabetes  mellitus ([place])    Hypertension associated with type 2 diabetes mellitus ([place])    Personal history of prostate cancer    Carotid atherosclerosis    GERD (gastroesophageal reflux disease) 08/20/2013    ONSET DATE: 10/05/21  REFERRING DIAG: X09.407 (ICD-10-CM) - Cerebral infarction due to unspecified occlusion or stenosis of left middle cerebral artery  THERAPY DIAG:  Abnormality of gait and mobility  Difficulty in walking, not elsewhere classified  Muscle weakness (generalized)  Unsteadiness on feet  Rationale for Evaluation and Treatment Rehabilitation  SUBJECTIVE:  SUBJECTIVE STATEMENT: Pt prefers to be in evaluation alone despite apraxia so subjective somewhat limited. Pt able to respond to 1 word questions but has difficulty verbalizing sentences. Pt lives with family in 1 stry home with 4 STE, pt has had 1 fall since leaving hospital but descriptors of the fall were limited but he states he lost his balance when he was walking inside with his walker. Pt was independent with ADLs and all activities prior to onset of stroke. Pt has had no other history of stroke to his knowledge.  Pt accompanied by: self  PERTINENT HISTORY:Per H and P from 7/17 hospital d/c Pt  is a 76 year old male with history of left-sided carotid stenosis, non-insulin-dependent diabetes mellitus type 2, PAD, GERD, mild COPD, history of tobacco use, hyperlipidemia, hypertension, who presents emergency department for chief concerns of facial droop, expressive aphasia, and R sided weakness  PAIN:  Are you having pain? Yes: NPRS scale: 6/10 Pain location: left shoulder, right hand   PRECAUTIONS: Fall  WEIGHT BEARING RESTRICTIONS No  FALLS: Has patient fallen in last 6 months? Yes. Number of falls 1  LIVING  ENVIRONMENT: Lives with: lives with their family and lived alone prior to onset  Lives in: House/apartment Stairs: Yes: External: 4 steps; can reach both Has following equipment at home: Walker - 2 wheeled and shower chair  PLOF: Independent  PATIENT GOALS become independent with everyday activities like he was prior to the onset of the stroke   OBJECTIVE:   DIAGNOSTIC FINDINGS: 1. 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 No associated hemorrhage or significant regional mass effect.  2. Loss of normal flow voids throughout the left ICA and MCA,  consistent with slow flow and/or occlusion. Susceptibility artifact  within proximal left MCA branches consistent with intraluminal  thrombus.  3. Underlying mild chronic microvascular ischemic disease. Tiny  remote left ACA distribution infarct.     MRA HEAD IMPRESSION:   "1. Interval near complete occlusion of the left ICA and MCA,  presumably acute in nature given the presence of the acute left MCA  territory infarct. Left A1 segment not well seen either, also likely  occluded.  2. Otherwise wide patency of the major intracranial arterial  circulation. No other hemodynamically significant or correctable  stenosis.     MRA NECK IMPRESSION:  1. 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 Some attenuated flow seen  distally within the cervical left ICA, with subsequent reocclusion  by the skull base.  2. 50% atheromatous stenosis at the origin of the cervical right  ICA. Otherwise wide patency of the right carotid artery system.  3. Wide patency of the vertebral arteries within the neck.".    COGNITION: Overall cognitive status: Within functional limits for tasks assessed and subjective history limited secondary to aphasia but able to answer 1 word questions appropriately.    SENSATION: WFL  COORDINATION: WNL with heel to shin testing   EDEMA:  None present in LE      LOWER EXTREMITY ROM:   AROM WNL    LOWER EXTREMITY MMT:    MMT Right Eval Left Eval  Hip flexion 4 4+  Hip extension    Hip abduction 4 5  Hip adduction 4 5  Hip internal rotation    Hip external rotation    Knee flexion 4 5  Knee extension 4 5  Ankle dorsiflexion 4+ 5  Ankle plantarflexion  4+ 5  Ankle inversion 5 5  Ankle eversion 5 5  (Blank rows = not tested)  BED MOBILITY:  No limitations per pt report   TRANSFERS: Assistive device utilized: Environmental consultant - 2 wheeled  Sit to stand: Modified independence Stand to sit: Complete Independence Chair to chair: SBA     STAIRS:  Level of Assistance: SBA  Stair Negotiation Technique: Step to Pattern with Bilateral Rails  Number of Stairs: 4   Height of Stairs: 6in  Comments: Right lower extremity foot catches when a sending steps and patient performs descending steps utilizing right lower extremity for more support.  Patient reports he knows he is doing this "the hard way"  GAIT: Gait pattern: decreased arm swing- Right and decreased step length- Right Distance walked: 10 M Assistive device utilized: Environmental consultant - 2 wheeled and None Level of assistance: Modified independence Comments: difficulty with turns and object navigation per DGI tesitng, able to walk without AD but only in controlled environment without obstacles or significant challenge  FUNCTIONAL TESTs:  5 times sit to stand: 16.95 sec no UE assist 10 meter walk test: .76 m/s without AD  Berg Balance Scale: 44/56 DGI:10  PATIENT SURVEYS:  FOTO to retest next session, likely with either asking patient questions or having caregiver complete patient completed but had 97% indicating he could run and jump with these and when questioned about this following patient reports he cannot run or jump with these and would benefit from filling this out again next time.  TODAY'S TREATMENT:  Access Code: [email or web address] URL: [email or web address] Date:  11/24/2021 Prepared by: Rivka Barbara  Exercises - Side Stepping with Counter Support  - 1 x daily - 7 x weekly - 2 sets - 10 reps - Standing Tandem Balance with Counter Support  - 1 x daily - 7 x weekly - 3 sets - 30 second hold - Standing March with Counter Support  - 1 x daily - 7 x weekly - 3 sets - 10 reps - Sit to Stand Without Arm Support  - 1 x daily - 7 x weekly - 2 sets - 10 reps   PATIENT EDUCATION: Education details: POC Person educated: Patient Education method: Explanation Education comprehension: verbalized understanding   HOME EXERCISE PROGRAM: Access Code: [email or web address] URL: [email or web address] Date: 11/24/2021 Prepared by: Rivka Barbara  Exercises - Side Stepping with Counter Support  - 1 x daily - 7 x weekly - 2 sets - 10 reps - Standing Tandem Balance with Counter Support  - 1 x daily - 7 x weekly - 3 sets - 30 second hold - Standing March with Counter Support  - 1 x daily - 7 x weekly - 3 sets - 10 reps - Sit to Stand Without Arm Support  - 1 x daily - 7 x weekly - 2 sets - 10 reps    GOALS: Goals reviewed with patient? Yes  SHORT TERM GOALS: Target date: 12/22/2021       Patient will be independent in home exercise program to improve strength/mobility for better functional independence with ADLs. Baseline: No HEP currently  Goal status: INITIAL  LONG TERM GOALS: Target date: 02/16/2022    1.  Patient (> 51 years old) will complete five times sit to stand test in < 15 seconds indicating an increased LE strength and improved balance. Baseline: 16.95 Goal status: INITIAL  2.  Patient will increase FOTO score to equal to or greater than  60   to demonstrate statistically significant improvement in mobility  and quality of life.  Baseline: Test neck session Goal status: INITIAL   3.  Patient will increase Berg Balance score by > 6 points to demonstrate decreased fall risk during functional activities. Baseline: 44 Goal status:  INITIAL   4.   Patient will improve DGI by 5 points or more to reduce fall risk and demonstrate improved gait ability. Baseline: 10 Goal status: INITIAL  5.   Patient will increase 10 meter walk test to >1.79ms as to improve gait speed for better community ambulation and to reduce fall risk. Baseline: .773m Goal status: INITIAL   ASSESSMENT:  CLINICAL IMPRESSION: Patient is a [age].o. male  who was seen today for physical therapy evaluation and treatment for R sided hemiplegia following L MCA stroke.  Patient presents with right lower extremity weakness evidenced by manual muscle testing.  Patient also presents as high risk factors for falls particularly with ambulation as evidenced by BeMerrilee Janskyalance test as well as dynamic gait index performed this session.  Patient did complete focus on therapeutic outcome survey but results were not consistent with his current level of function and when therapist was talking with patient about results from F OTO score patient reports that he is unable to do a lot of things he indicated he could do with these and may benefit from assistance from his caregiver for filling this out.  These episodes may be secondary to his aphasia and inability to comprehend some of the answers on the questions.  Patient will benefit from skilled physical therapy to improve his gait, balance, mobility, prevent falls, improve his lower extremity strength, and improve his quality of life.   OBJECTIVE IMPAIRMENTS Abnormal gait, cardiopulmonary status limiting activity, decreased activity tolerance, decreased balance, decreased endurance, decreased mobility, difficulty walking, decreased strength, and decreased safety awareness.   ACTIVITY LIMITATIONS lifting, bending, standing, squatting, stairs, transfers, and locomotion level  PARTICIPATION LIMITATIONS: meal prep, shopping, and community activity  PERSONAL FACTORS Age and 3+ comorbidities: left-sided carotid stenosis,  non-insulin-dependent diabetes mellitus type 2, PAD, GERD, mild COPD, history of tobacco use, hyperlipidemia, hypertension  are also affecting patient's functional outcome.   REHAB POTENTIAL: Good  CLINICAL DECISION MAKING: Evolving/moderate complexity  EVALUATION COMPLEXITY: Moderate  PLAN: PT FREQUENCY: 2x/week  PT DURATION: 12 weeks  PLANNED INTERVENTIONS: Therapeutic exercises, Therapeutic activity, Neuromuscular re-education, Balance training, Gait training, Patient/Family education, Self Care, Joint mobilization, Stair training, Moist heat, and Manual therapy  PLAN FOR NEXT SESSION: begin POC, focus on R LE NMR to improve proprioception and coordination. May challenge walking without AD as in eval retest focus on therapeutic outcome survey utilizing caregiver to assist with answering questions. ChParticia LatherPT 11/24/2021, 11:21 AM

## 2021-11-24 ENCOUNTER — Ambulatory Visit: Payer: Medicare Other | Attending: Physical Medicine and Rehabilitation | Admitting: Physical Therapy

## 2021-11-24 ENCOUNTER — Encounter: Payer: Self-pay | Admitting: Physical Therapy

## 2021-11-24 DIAGNOSIS — I63512 Cerebral infarction due to unspecified occlusion or stenosis of left middle cerebral artery: Secondary | ICD-10-CM | POA: Insufficient documentation

## 2021-11-24 DIAGNOSIS — R262 Difficulty in walking, not elsewhere classified: Secondary | ICD-10-CM | POA: Diagnosis present

## 2021-11-24 DIAGNOSIS — R269 Unspecified abnormalities of gait and mobility: Secondary | ICD-10-CM | POA: Diagnosis present

## 2021-11-24 DIAGNOSIS — R278 Other lack of coordination: Secondary | ICD-10-CM | POA: Diagnosis present

## 2021-11-24 DIAGNOSIS — R482 Apraxia: Secondary | ICD-10-CM | POA: Diagnosis present

## 2021-11-24 DIAGNOSIS — M6281 Muscle weakness (generalized): Secondary | ICD-10-CM | POA: Insufficient documentation

## 2021-11-24 DIAGNOSIS — R4701 Aphasia: Secondary | ICD-10-CM | POA: Diagnosis present

## 2021-11-24 DIAGNOSIS — R2681 Unsteadiness on feet: Secondary | ICD-10-CM | POA: Insufficient documentation

## 2021-11-28 ENCOUNTER — Encounter: Payer: Self-pay | Admitting: Occupational Therapy

## 2021-11-28 ENCOUNTER — Ambulatory Visit: Payer: Medicare Other | Admitting: Speech Pathology

## 2021-11-28 ENCOUNTER — Ambulatory Visit: Payer: Medicare Other | Admitting: Occupational Therapy

## 2021-11-28 ENCOUNTER — Ambulatory Visit: Payer: Medicare Other | Admitting: Physical Therapy

## 2021-11-28 DIAGNOSIS — R4701 Aphasia: Secondary | ICD-10-CM

## 2021-11-28 DIAGNOSIS — M6281 Muscle weakness (generalized): Secondary | ICD-10-CM

## 2021-11-28 DIAGNOSIS — R482 Apraxia: Secondary | ICD-10-CM

## 2021-11-28 DIAGNOSIS — R269 Unspecified abnormalities of gait and mobility: Secondary | ICD-10-CM | POA: Diagnosis not present

## 2021-11-28 DIAGNOSIS — R278 Other lack of coordination: Secondary | ICD-10-CM

## 2021-11-28 NOTE — Therapy (Signed)
OUTPATIENT SPEECH LANGUAGE PATHOLOGY TREATMENT   Patient Name: Gerald Bauer MRN: [id number] DOB:1945/05/29, 76 y.o., male Today's Date: 11/28/2021  PCP: [name], PA-C REFERRING PROVIDER: Reesa Chew, PA-C   End of Session - 11/28/21 1713     Visit Number 5    Number of Visits 25    Date for SLP Re-Evaluation 02/04/22    SLP Start Time 109    SLP Stop Time  1100    SLP Time Calculation (min) 55 min    Activity Tolerance Patient tolerated treatment well             Past Medical History:  Diagnosis Date   Acid reflux    Arthritis    hands   Carotid atherosclerosis    Diabetes mellitus without complication ([place])    Ganglion cyst 02/23/2015   Hyperlipemia    Hypertension    Joint pain in fingers of right hand 02/23/2015   Dominant hand   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 07/11/2015   Prostate cancer ([place])    history   Stroke (cerebrum) ([place]) 10/06/2021   Tachycardia    Tinea pedis of both feet 01/10/2017   Past Surgical History:  Procedure Laterality Date   [place]  2013   PROSTATECTOMY  2012   Patient Active Problem List   Diagnosis Date Noted   Hemiparesis, aphasia, and dysphagia as late effects of stroke ([place]) 11/07/2021   Lower urinary tract symptoms (LUTS) 11/07/2021   Myalgia due to statin 02/21/2021   Type 2 diabetes mellitus without complication, without long-term current use of insulin ([place]) 02/21/2021   Aortic atherosclerosis ([place]) 04/27/2020   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   Onychomycosis of multiple toenails with type 2 diabetes mellitus ([place]) 07/19/2016   Carotid stenosis 03/13/2016   Hyperlipidemia associated with type 2 diabetes mellitus ([place])    Hypertension associated with type 2 diabetes mellitus ([place])    Personal history of prostate cancer    Carotid atherosclerosis    GERD (gastroesophageal reflux disease) 08/20/2013    ONSET DATE:  10/05/2021   REFERRING DIAG: Cerebral infarction due to unspecified occlusion of left middle cerebral artery  THERAPY DIAG:  Verbal apraxia  Aphasia  Rationale for Evaluation and Treatment Rehabilitation  SUBJECTIVE:   SUBJECTIVE STATEMENT: j Gerald Bauer accompanied by: [name] waited in waiting area  PERTINENT HISTORY: Gerald Bauer  is a 76 year old male with history of left-sided carotid stenosis, non-insulin-dependent diabetes mellitus type 2, PAD, GERD, mild COPD, history of tobacco use, hyperlipidemia, hypertension, who presented 10/05/21 to [facility] ED with facial droop, expressive aphasia, and R sided weakness and found to have left MCA CVA. He was admitted to [facility] 7/17-10/31/21. Advanced to dysphagia 3/thin liquids by cup by time of d/c from CIR. 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 Difficult to fully assess cognition given severity of linguistic impairment; however, does present with decreased safety awareness, diminished problem-solving, emergent awareness, and mild impulsivity with movement."   DIAGNOSTIC FINDINGS: MBS 10/19/21: "Gerald Bauer presents with overall mild oropharyngeal dysphagia. Oral phase c/b piecemeal deglutition, decreased mastication, decreased bolus cohesion, premature spillage, and weak lingual manipulation. Pharyngeal phase c/b reduced pharyngeal peristalsis + reduced BOT approximation to PPW, which results in mild to moderate vallecular and pyriform sinuses residuals (vallecula > pyriform sinuses for solids, pyriform sinuses > vallecula for liquids). No penetration  nor aspiration appreciated with thin liquid via cup, Dysphagia 1 (puree), Dysphagia 2 (chopped), Dysphagia 3 (mechanical soft), or with 13 mm Barium tablet placed in puree. Once instance of sensed aspiration with intake of large bolus of thin liquid via straw due to swallow initiation delay to pyriform sinuses and  mistiming of swallow-breath cycle; despite strong reflexive cough aspirant was not fully cleared from trachea. Gerald Bauer's level of swallow initiation was noted to vary with liquid consumption (over base of epiglottis and at pyriform sinuses) with level of swallow initiation for solids to be consistent at the vallecula. Pharyngeal stasis was partially cleared with reflexive secondary swallow." MRI brain 10/05/21: 1. 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 No associated hemorrhage or significant regional mass effect. 2. Loss of normal flow voids throughout the left ICA and MCA, consistent with slow flow and/or occlusion. 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 3. Underlying mild chronic microvascular ischemic disease. Tiny remote left ACA distribution infarct.   PAIN:  Are you having pain? Yes: NPRS scale: 5/10 Pain location: left shoulder   PATIENT GOALS talk better  OBJECTIVE:   TODAY'S TREATMENT:  Gerald Bauer required min cues with dysphagia HEP (intensity/duration). Completed oral exercises (lingual sweep, pucker, pucker/retraction, lingual press) x10 and effortful swallow x 10 with mod fatigue. SLP used supported conversation to discuss [place] Day with Gerald Bauer. Gerald Bauer said, "It was good." But appeared to have more to discuss; required max cues (Y/N questions to narrow topic, written keywords, alphabet board to spell/approximate names of those present). Able to ascertain that Gerald Bauer spent the night at his son's house, and he was able to share that son was using his tractor ([id number]) to load (l-o) pallets (p-r-a-d-l, then able to clarify verbally). Continues to practice apraxia activities at home. Most success with phrase level tasks (3-4 words) with model, simultaneous (70%), then independent production (65%).     PATIENT EDUCATION: Education details: apraxia HEP, visuals aid motor planning Person educated: Patient Education method:  Explanation, Demonstration, and written supports Education comprehension: verbalized understanding and needs further education   GOALS: Goals reviewed with patient? Yes  SHORT TERM GOALS: Target date: 10 sessions  Patient will participate in clinical assessment of swallow function with goals added as needed. Baseline: Goal status: MET  2.  Gerald Bauer will communicate emergency information 100% accuracy using visual aid if necessary.  Baseline:  Goal status: INITIAL  3.  Gerald Bauer will approximate personally relevant words and phrases >80% accuracy using script training and min visual cues for apraxia.  Baseline:  Goal status: INITIAL  4.  Gerald Bauer will generate at least 4 descriptors of target word 80% of the time using semantic feature analysis to improve abilities in wordfinding and resolving communication breakdowns.  Baseline:  Goal status: INITIAL  5.  Gerald Bauer will complete HEP for dysphagia with rare min A. Baseline:  Goal status: INITIAL    LONG TERM GOALS: Target date: 02/04/2022  Gerald Bauer will engage in 5-8 minutes simple-mod complex conversation re: topic of interest with supported conversation, aphasia compensations.  Baseline:  Goal status: INITIAL  2.  Gerald Bauer will ID and attempt repair of communication breakdowns >90% of the time in session.   Baseline:  Goal status: INITIAL  3.  Gerald Bauer/family will demonstrate knowledge of community resources and activities to support language/communication. Baseline:  Goal status: INITIAL  4.  Gerald Bauer will demonstrate use of swallowing precautions independently with s/sx aspiration <5% of trials. Baseline:  Goal status: INITIAL  ASSESSMENT:  CLINICAL IMPRESSION: Patient presents with moderate Broca's aphasia, moderate verbal apraxia, dysarthria (difficult to determine extent due to language deficits and apraxia), and mild oropharyngeal dysphagia. Gerald Bauer continues to supplement verbal communication with alphabet board during breakdowns, although extended time and  mod cues required. Patient demonstrates good use of nonverbals and Y/N responses, however is significantly limited in his ability to relay thoughts, feelings, and non-contextual or abstract information. I recommend skilled ST to improve Gerald Bauer's swallowing, language and motor speech skills to increase swallowing safety, improve communication and reduce frustration.   OBJECTIVE IMPAIRMENTS include expressive language, receptive language, aphasia, apraxia, dysarthria, and dysphagia. These impairments are limiting patient from managing medications, managing appointments, managing finances, household responsibilities, ADLs/IADLs, effectively communicating at home and in community, and safety when swallowing. Factors affecting potential to achieve goals and functional outcome are cooperation/participation level; Gerald Bauer appears highly motivated with good family support. Patient will benefit from skilled SLP services to address above impairments and improve overall function.  REHAB POTENTIAL: Excellent  PLAN: SLP FREQUENCY: 2x/week  SLP DURATION: 12 weeks  PLANNED INTERVENTIONS: Aspiration precaution training, Pharyngeal strengthening exercises, Diet toleration management , Language facilitation, Environmental controls, Trials of upgraded texture/liquids, Cognitive reorganization, Internal/external aids, Functional tasks, Multimodal communication approach, SLP instruction and feedback, Compensatory strategies, and Patient/family education    [name], MS, Actor [phone]

## 2021-11-28 NOTE — Therapy (Addendum)
OUTPATIENT OCCUPATIONAL THERAPY NEURO TREATMENT  Patient Name: Gerald Bauer MRN: [id number] DOB:1946-02-12, 76 y.o., male Today's Date: 11/28/2021  PCP: Threasa Alpha, PA REFERRING PROVIDER: Reesa Chew   OT End of Session - 11/28/21 1156     Visit Number 7    Number of Visits 24    Date for OT Re-Evaluation 01/29/22    OT Start Time 105    OT Stop Time 0174    OT Time Calculation (min) 45 min    Activity Tolerance Patient tolerated treatment well    Behavior During Therapy [facility] for tasks assessed/performed            Past Medical History:  Diagnosis Date   Acid reflux    Arthritis    hands   Carotid atherosclerosis    Diabetes mellitus without complication ([place])    Ganglion cyst 02/23/2015   Hyperlipemia    Hypertension    Joint pain in fingers of right hand 02/23/2015   Dominant hand   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 07/11/2015   Prostate cancer ([place])    history   Stroke (cerebrum) ([place]) 10/06/2021   Tachycardia    Tinea pedis of both feet 01/10/2017   Past Surgical History:  Procedure Laterality Date   [place]  2013   PROSTATECTOMY  2012   Patient Active Problem List   Diagnosis Date Noted   Hemiparesis, aphasia, and dysphagia as late effects of stroke ([place]) 11/07/2021   Lower urinary tract symptoms (LUTS) 11/07/2021   Myalgia due to statin 02/21/2021   Type 2 diabetes mellitus without complication, without long-term current use of insulin ([place]) 02/21/2021   Aortic atherosclerosis ([place]) 04/27/2020   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   Onychomycosis of multiple toenails with type 2 diabetes mellitus ([place]) 07/19/2016   Carotid stenosis 03/13/2016   Hyperlipidemia associated with type 2 diabetes mellitus ([place])    Hypertension associated with type 2 diabetes mellitus ([place])    Personal history of prostate cancer    Carotid atherosclerosis    GERD (gastroesophageal  reflux disease) 08/20/2013    ONSET DATE: 10/05/21  REFERRING DIAG: L MCA CVA  THERAPY DIAG:  Muscle weakness (generalized)  Other lack of coordination  Rationale for Evaluation and Treatment Rehabilitation  SUBJECTIVE:   SUBJECTIVE STATEMENT:     PERTINENT HISTORY: Per chart, Gerald Bauer is a 77 year old right-handed male with history of hypertension, hyperlipidemia, diabetes mellitus, prostate cancer/prostatectomy 2012, quit smoking 24 years ago.  Presented to [facility] 10/[facility]/2021 with right side weakness/facial droop and expressive aphasia.  Blood pressure 155/102.  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  No associated hemorrhage or significant mass effect.    PAIN:  Are you having pain? Yes: Pain location:  right wrist 5/10 pain Pain description:   Aggravating factors:   Relieving factors:    FALLS: Has patient fallen in last 6 months? Yes. Number of falls 3 since CVA  PLOF: Independent/retired Dealer  PATIENT GOALS : Pt points to his hand (wants to be able to use his hand)  OBJECTIVE:   HAND DOMINANCE: Right   FUNCTIONAL OUTCOME MEASURES: FOTO: 48  UPPER EXTREMITY ROM     Active ROM Right eval Left Eval WNL  Shoulder flexion 90 (135)   Shoulder abduction 95 (110)    UPPER EXTREMITY MMT:     MMT Right eval Left Eval 5/5  Shoulder  flexion 3-   Shoulder abduction 3-   Shoulder adduction    Shoulder extension    Shoulder internal rotation 3-   Shoulder external rotation 3-   Middle trapezius    Lower trapezius    Elbow flexion 4-   Elbow extension 4+   Wrist flexion 4   Wrist extension 4-   (Blank rows = not tested)  HAND FUNCTION: Grip strength: Right: 14 lbs; Left: 58 lbs, Lateral pinch: Right: 7 lbs, Left: 19 lbs, and 3 point pinch: Right: 2 lbs, Left: 16 lbs  COORDINATION: Finger Nose Finger test: difficult/lacks precision with reaching targets 9 Hole Peg test: Right: unable sec; Left: 27  sec Able to oppose 2nd and 3rd digits to thumb on R hand  TODAY'S TREATMENT:   Therapeutic Exercise:  Pt. worked on [employer], and reciprocal motion using the UBE while seated for 8 min. With minimal resistance. Constant monitoring was provided.  Pt. performed gross gripping with a gross grip strengthener. Pt. worked on sustaining grip while grasping pegs and placing them into a container placed at the tabletop surface, to the right. The gripper was set to 6.6 # of grip strength resistance. Pt. Was able to remove all pegs.  Pt. worked on translatory movements, grasping and storing 1" grooved pegs in the palm of the hand,  and 1" circular pegs.     Pt. is making consistent progress, however presents with 5/10 right wrist pain today. Pt. was able to maintain his right hand on the UBE handle throughout the full duration of the task. Pt continues to be able to initiate changing the direction every 1 min. On the UBE. Pt. was able to clear all pegs from the board with the resistive grippers. Pt. Required cues for visual demonstration for proper technique.  Pt. was able to store 2- 1" pegs in the palm of his right hand. Pt. was able to grasp, and store multiple circular pegs. Pt. continues to work on improving RUE functioning in order to improve engagement in, and maximize independence with ADLs, and IADL tasks.      PATIENT EDUCATION: Education details: HEP progression Person educated: pt Education method: Explanation and Verbal cues, handout Education comprehension: verbalized understanding, returned demo   HOME EXERCISE PROGRAM: theraputty  GOALS: Goals reviewed with patient? Yes  SHORT TERM GOALS: Target date: 12/18/21  Pt will be indep to perform HEP for increasing strength and coordination throughout RUE.  Baseline: Not yet initiated Goal status: INITIAL  2.  Pt will manage clothing fasteners with extra time, using RUE as an assist  Baseline: unable; pt has elastic laces and  wears elastic waisted pants. Goal status: INITIAL  LONG TERM GOALS: Target date: 01/29/22  Pt will increase FOTO score to 55 or better to indicate improved performance with daily tasks.  Baseline: 48 Goal status: INITIAL  2.  Pt will increase R grip strength by 10 or more lbs to enable pt to hold and carry light ADL supplies without dropping.  Baseline: R grip 14#, L 58# Goal status: INITIAL  3.  Pt will increase RUE strength to be able to engage RUE into ADLs at least 50% of the time. Baseline: Pt using L non-dominant arm to manage ADLs. Goal status: INITIAL  4.  Pt will complete 9 hole peg test on the R in 3 min or less to work towards ability to use R hand to pick up small ADL supplies.  Baseline: Pt can remove a peg but can not  pick one up Goal status: INITIAL   ASSESSMENT:  CLINICAL IMPRESSION:   Pt. is making consistent progress, however presents with 5/10 right wrist pain today. Pt. was able to maintain his right hand on the UBE handle throughout the full duration of the task. Pt continues to be able to initiate changing the direction every 1 min. On the UBE. Pt. was able to clear all pegs from the board with the resistive grippers. Pt. Required cues for visual demonstration for proper technique.  Pt. was able to store 2- 1" pegs in the palm of his right hand. Pt. was able to grasp, and store multiple circular pegs. Pt. continues to work on improving RUE functioning in order to improve engagement in, and maximize independence with ADLs, and IADL tasks.    PERFORMANCE DEFICITS in functional skills including ADLs, IADLs, coordination, dexterity, ROM, strength, pain, flexibility, FMC, GMC, mobility, balance, continence, decreased knowledge of use of DME, and UE functional use, cognitive skills including safety awareness, and psychosocial skills including.   IMPAIRMENTS are limiting patient from ADLs, IADLs, leisure, and social participation.   COMORBIDITIES may have  co-morbidities  that affects occupational performance. Patient will benefit from skilled OT to address above impairments and improve overall function.  MODIFICATION OR ASSISTANCE TO COMPLETE EVALUATION: Min-Moderate modification of tasks or assist with assess necessary to complete an evaluation.  OT OCCUPATIONAL PROFILE AND HISTORY: Problem focused assessment: Including review of records relating to presenting problem.  CLINICAL DECISION MAKING: Moderate - several treatment options, min-mod task modification necessary  REHAB POTENTIAL: Good  EVALUATION COMPLEXITY: Moderate    PLAN: OT FREQUENCY: 2x/week  OT DURATION: 12 weeks  PLANNED INTERVENTIONS: self care/ADL training, therapeutic exercise, therapeutic activity, neuromuscular re-education, manual therapy, passive range of motion, balance training, functional mobility training, moist heat, cryotherapy, patient/family education, cognitive remediation/compensation, energy conservation, coping strategies training, and DME and/or AE instructions  RECOMMENDED OTHER SERVICES: N/A  CONSULTED AND AGREED WITH PLAN OF CARE: Patient and family member/caregiver  PLAN FOR NEXT SESSION: HEP progression, neuro re-ed, therapeutic exercises  Harrel Carina, MS, OTR/L   Harrel Carina, OT 11/28/2021, 12:07 PM

## 2021-12-04 ENCOUNTER — Ambulatory Visit: Payer: Medicare Other

## 2021-12-04 ENCOUNTER — Encounter: Payer: Self-pay | Admitting: Occupational Therapy

## 2021-12-04 ENCOUNTER — Ambulatory Visit: Payer: Medicare Other | Admitting: Speech Pathology

## 2021-12-04 ENCOUNTER — Ambulatory Visit: Payer: Medicare Other | Admitting: Occupational Therapy

## 2021-12-04 DIAGNOSIS — R4701 Aphasia: Secondary | ICD-10-CM

## 2021-12-04 DIAGNOSIS — M6281 Muscle weakness (generalized): Secondary | ICD-10-CM

## 2021-12-04 DIAGNOSIS — R269 Unspecified abnormalities of gait and mobility: Secondary | ICD-10-CM | POA: Diagnosis not present

## 2021-12-04 DIAGNOSIS — R2681 Unsteadiness on feet: Secondary | ICD-10-CM

## 2021-12-04 DIAGNOSIS — R262 Difficulty in walking, not elsewhere classified: Secondary | ICD-10-CM

## 2021-12-04 DIAGNOSIS — R482 Apraxia: Secondary | ICD-10-CM

## 2021-12-04 DIAGNOSIS — R278 Other lack of coordination: Secondary | ICD-10-CM

## 2021-12-04 NOTE — Therapy (Signed)
OUTPATIENT SPEECH LANGUAGE PATHOLOGY TREATMENT   Patient Name: Gerald Bauer MRN: [id number] DOB:Apr 12, 1945, 76 y.o., male Today's Date: 12/04/2021  PCP: [name], PA-C REFERRING PROVIDER: Reesa Chew, PA-C   End of Session - 12/04/21 1800     Visit Number 6    Number of Visits 25    Date for SLP Re-Evaluation 02/04/22    SLP Start Time 0900    SLP Stop Time  1000    SLP Time Calculation (min) 60 min    Activity Tolerance Patient tolerated treatment well             Past Medical History:  Diagnosis Date   Acid reflux    Arthritis    hands   Carotid atherosclerosis    Diabetes mellitus without complication ([place])    Ganglion cyst 02/23/2015   Hyperlipemia    Hypertension    Joint pain in fingers of right hand 02/23/2015   Dominant hand   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 07/11/2015   Prostate cancer ([place])    history   Stroke (cerebrum) ([place]) 10/06/2021   Tachycardia    Tinea pedis of both feet 01/10/2017   Past Surgical History:  Procedure Laterality Date   [place]  2013   PROSTATECTOMY  2012   Patient Active Problem List   Diagnosis Date Noted   Hemiparesis, aphasia, and dysphagia as late effects of stroke ([place]) 11/07/2021   Lower urinary tract symptoms (LUTS) 11/07/2021   Myalgia due to statin 02/21/2021   Type 2 diabetes mellitus without complication, without long-term current use of insulin ([place]) 02/21/2021   Aortic atherosclerosis ([place]) 04/27/2020   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   Onychomycosis of multiple toenails with type 2 diabetes mellitus ([place]) 07/19/2016   Carotid stenosis 03/13/2016   Hyperlipidemia associated with type 2 diabetes mellitus ([place])    Hypertension associated with type 2 diabetes mellitus ([place])    Personal history of prostate cancer    Carotid atherosclerosis    GERD (gastroesophageal reflux disease) 08/20/2013    ONSET DATE:  10/05/2021   REFERRING DIAG: Cerebral infarction due to unspecified occlusion of left middle cerebral artery  THERAPY DIAG:  Aphasia  Verbal apraxia  Rationale for Evaluation and Treatment Rehabilitation  SUBJECTIVE:   SUBJECTIVE STATEMENT: Pt shakes his head to indicate not feeling well today. Pt accompanied by: [name] waited in waiting area  PERTINENT HISTORY: Pt  is a 76 year old male with history of left-sided carotid stenosis, non-insulin-dependent diabetes mellitus type 2, PAD, GERD, mild COPD, history of tobacco use, hyperlipidemia, hypertension, who presented 10/05/21 to [facility] ED with facial droop, expressive aphasia, and R sided weakness and found to have left MCA CVA. He was admitted to [facility] 7/17-10/31/21. Advanced to dysphagia 3/thin liquids by cup by time of d/c from CIR. 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 Difficult to fully assess cognition given severity of linguistic impairment; however, does present with decreased safety awareness, diminished problem-solving, emergent awareness, and mild impulsivity with movement."   DIAGNOSTIC FINDINGS: MBS 10/19/21: "Pt presents with overall mild oropharyngeal dysphagia. Oral phase c/b piecemeal deglutition, decreased mastication, decreased bolus cohesion, premature spillage, and weak lingual manipulation. Pharyngeal phase c/b reduced pharyngeal peristalsis + reduced BOT approximation to PPW, which results in mild to moderate vallecular and pyriform sinuses residuals (vallecula > pyriform sinuses for  solids, pyriform sinuses > vallecula for liquids). No penetration nor aspiration appreciated with thin liquid via cup, Dysphagia 1 (puree), Dysphagia 2 (chopped), Dysphagia 3 (mechanical soft), or with 13 mm Barium tablet placed in puree. Once instance of sensed aspiration with intake of large bolus of thin liquid via straw due to  swallow initiation delay to pyriform sinuses and mistiming of swallow-breath cycle; despite strong reflexive cough aspirant was not fully cleared from trachea. Pt's level of swallow initiation was noted to vary with liquid consumption (over base of epiglottis and at pyriform sinuses) with level of swallow initiation for solids to be consistent at the vallecula. Pharyngeal stasis was partially cleared with reflexive secondary swallow." MRI brain 10/05/21: 1. 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 No associated hemorrhage or significant regional mass effect. 2. Loss of normal flow voids throughout the left ICA and MCA, consistent with slow flow and/or occlusion. 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 3. Underlying mild chronic microvascular ischemic disease. Tiny remote left ACA distribution infarct.   PAIN:  Are you having pain? Yes: NPRS scale: 5/10 Pain location: left shoulder   PATIENT GOALS talk better  OBJECTIVE:   TODAY'S TREATMENT:  Due to OT's report that pt wanted to talk to her about his model trains, SLP worked with pt to create visual supports to facilitate discussion/information sharing. SLP prompted pt to use visuals on his phone (photos, videos) and worked with pt to generate list of phrases and keywords. Pt gestures to building in his image and attempted naming; required mod cues to initiate spelling with alphabet board when anomia/apraxia impacted responses (~95% of the time). Pt reviewed list of model train terms and selected personally relevant words; approximated 2 syllable words with mod cues/imitation.     PATIENT EDUCATION: Education details: apraxia HEP, use of visual supports Person educated: Patient Education method: Explanation, Demonstration, and written supports Education comprehension: verbalized understanding and needs further education   GOALS: Goals reviewed with patient?  Yes  SHORT TERM GOALS: Target date: 10 sessions  Patient will participate in clinical assessment of swallow function with goals added as needed. Baseline: Goal status: MET  2.  Pt will communicate emergency information 100% accuracy using visual aid if necessary.  Baseline:  Goal status: INITIAL  3.  Pt will approximate personally relevant words and phrases >80% accuracy using script training and min visual cues for apraxia.  Baseline:  Goal status: INITIAL  4.  Pt will generate at least 4 descriptors of target word 80% of the time using semantic feature analysis to improve abilities in wordfinding and resolving communication breakdowns.  Baseline:  Goal status: INITIAL  5.  Pt will complete HEP for dysphagia with rare min A. Baseline:  Goal status: INITIAL    LONG TERM GOALS: Target date: 02/04/2022  Pt will engage in 5-8 minutes simple-mod complex conversation re: topic of interest with supported conversation, aphasia compensations.  Baseline:  Goal status: INITIAL  2.  Pt will ID and attempt repair of communication breakdowns >90% of the time in session.   Baseline:  Goal status: INITIAL  3.  Pt/family will demonstrate knowledge of community resources and activities to support language/communication. Baseline:  Goal status: INITIAL  4.  Pt will demonstrate use of swallowing precautions independently with s/sx aspiration <5% of trials. 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 Pt continues to supplement verbal communication with alphabet board during breakdowns, although extended time and mod cues required. Patient demonstrates good use of nonverbals and Y/N responses, however is significantly limited in his  ability to relay thoughts, feelings, and non-contextual or abstract information. I recommend skilled ST to improve pt's swallowing, language and motor speech skills to increase swallowing safety, improve communication and reduce frustration.   OBJECTIVE IMPAIRMENTS include expressive language, receptive language, aphasia, apraxia, dysarthria, and dysphagia. These impairments are limiting patient from managing medications, managing appointments, managing finances, household responsibilities, ADLs/IADLs, effectively communicating at home and in community, and safety when swallowing. Factors affecting potential to achieve goals and functional outcome are cooperation/participation level; pt appears highly motivated with good family support. Patient will benefit from skilled SLP services to address above impairments and improve overall function.  REHAB POTENTIAL: Excellent  PLAN: SLP FREQUENCY: 2x/week  SLP DURATION: 12 weeks  PLANNED INTERVENTIONS: Aspiration precaution training, Pharyngeal strengthening exercises, Diet toleration management , Language facilitation, Environmental controls, Trials of upgraded texture/liquids, Cognitive reorganization, Internal/external aids, Functional tasks, Multimodal communication approach, SLP instruction and feedback, Compensatory strategies, and Patient/family education    [name], MS, Actor [phone]

## 2021-12-04 NOTE — Therapy (Signed)
OUTPATIENT PHYSICAL THERAPY NEURO TREATMENT   Patient Name: Gerald Bauer MRN: [id number] DOB:10-19-1945, 76 y.o., male Today's Date: 12/04/2021   PCP: Gerald Grana, PA-C REFERRING PROVIDER: Bary Leriche, PA-C   PT End of Session - 12/04/21 1204     Visit Number 2    Number of Visits 24    Date for PT Re-Evaluation 02/16/22    Progress Note Due on Visit 10    PT Start Time 1019    PT Stop Time 1100    PT Time Calculation (min) 41 min    Equipment Utilized During Treatment Gait belt    Activity Tolerance Patient tolerated treatment well    Behavior During Therapy WFL for tasks assessed/performed   Patient has aphasia but is able to respond appropriately to questions.             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 07/11/2015   Prostate cancer ([place])    history   Stroke (cerebrum) ([place]) 10/06/2021   Tachycardia    Tinea pedis of both feet 01/10/2017   Past Surgical History:  Procedure Laterality Date   APPENDECTOMY  1973   CHOLECYSTECTOMY  2013   PROSTATECTOMY  2012   Patient Active Problem List   Diagnosis Date Noted   Hemiparesis, aphasia, and dysphagia as late effects of stroke ([place]) 11/07/2021   Lower urinary tract symptoms (LUTS) 11/07/2021   Myalgia due to statin 02/21/2021   Type 2 diabetes mellitus without complication, without long-term current use of insulin ([place]) 02/21/2021   Aortic atherosclerosis ([place]) 04/27/2020   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   Onychomycosis of multiple toenails with type 2 diabetes mellitus ([place]) 07/19/2016   Carotid stenosis 03/13/2016   Hyperlipidemia associated with type 2 diabetes  mellitus ([place])    Hypertension associated with type 2 diabetes mellitus ([place])    Personal history of prostate cancer    Carotid atherosclerosis    GERD (gastroesophageal reflux disease) 08/20/2013    ONSET DATE: 10/05/21  REFERRING DIAG: N46.270 (ICD-10-CM) - Cerebral infarction due to unspecified occlusion or stenosis of left middle cerebral artery  THERAPY DIAG:  Unsteadiness on feet  Difficulty in walking, not elsewhere classified  Other lack of coordination  Muscle weakness (generalized)  Rationale for Evaluation and Treatment Rehabilitation  SUBJECTIVE:  SUBJECTIVE STATEMENT: Pt presents with R hand brace. Gerald Bauer, his daughter-in-law, reports pt fell at home trying to sit in a chair without arm rests. He lost his balance and fell from the chair. Pt fell onto his R wrist. It was swollen for a day or two. He has not followed-up with a physician regarding recent fall onto R wrist and pain. Reports no bruising. Did have faint bruise on R trunk they report has resolved. Pt reports pain has gotten better overall, and wearing the brace helps.  Pt accompanied by: self and DIL Gerald Bauer  PAIN today:  Are you having pain? Yes: NPRS scale: not rated/10 Pain location: R wrist  PERTINENT HISTORY:Per H and P from 7/17 hospital d/c Pt  is a 76 year old male with history of left-sided carotid stenosis, non-insulin-dependent diabetes mellitus type 2, PAD, GERD, mild COPD, history of tobacco use, hyperlipidemia, hypertension, who presents emergency department for chief concerns of facial droop, expressive aphasia, and R sided weakness  PAIN:  Are you having pain? Yes: NPRS scale: 6/10 Pain location: left shoulder, right hand   PRECAUTIONS: Fall  WEIGHT BEARING RESTRICTIONS No  FALLS: Has patient fallen in  last 6 months? Yes. Number of falls 1  LIVING ENVIRONMENT: Lives with: lives with their family and lived alone prior to onset  Lives in: House/apartment Stairs: Yes: External: 4 steps; can reach both Has following equipment at home: Walker - 2 wheeled and shower chair  PLOF: Independent  PATIENT GOALS become independent with everyday activities like he was prior to the onset of the stroke   OBJECTIVE:   DIAGNOSTIC FINDINGS: 1. 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 No associated hemorrhage or significant regional mass effect.  2. Loss of normal flow voids throughout the left ICA and MCA,  consistent with slow flow and/or occlusion. Susceptibility artifact  within proximal left MCA branches consistent with intraluminal  thrombus.  3. Underlying mild chronic microvascular ischemic disease. Tiny  remote left ACA distribution infarct.     MRA HEAD IMPRESSION:   "1. Interval near complete occlusion of the left ICA and MCA,  presumably acute in nature given the presence of the acute left MCA  territory infarct. Left A1 segment not well seen either, also likely  occluded.  2. Otherwise wide patency of the major intracranial arterial  circulation. No other hemodynamically significant or correctable  stenosis.     MRA NECK IMPRESSION:  1. 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 Some attenuated flow seen  distally within the cervical left ICA, with subsequent reocclusion  by the skull base.  2. 50% atheromatous stenosis at the origin of the cervical right  ICA. Otherwise wide patency of the right carotid artery system.  3. Wide patency of the vertebral arteries within the neck.".    COGNITION: Overall cognitive status: Within functional limits for tasks assessed and subjective history limited secondary to aphasia but able to answer 1 word questions appropriately.    SENSATION: WFL  COORDINATION: WNL with heel  to shin testing    LOWER EXTREMITY ROM:   AROM WNL    LOWER EXTREMITY MMT:    MMT Right Eval Left Eval  Hip flexion 4 4+  Hip extension    Hip abduction 4 5  Hip adduction 4 5  Hip internal rotation    Hip external rotation    Knee flexion 4 5  Knee extension 4 5  Ankle dorsiflexion 4+ 5  Ankle plantarflexion 4+ 5  Ankle inversion 5 5  Ankle eversion 5 5  (Blank rows = not tested)   TRANSFERS: Assistive device utilized: Environmental consultant - 2 wheeled  Sit to stand: Modified independence Stand to sit: Complete Independence Chair to chair: SBA  STAIRS:  Level of Assistance: SBA  Stair Negotiation Technique: Step to Pattern with Bilateral Rails  Number of Stairs: 4   Height of Stairs: 6in  Comments: Right lower extremity foot catches when a sending steps and patient performs descending steps utilizing right lower extremity for more support.  Patient reports he knows he is doing this "the hard way"  GAIT: Gait pattern: decreased arm swing- Right and decreased step length- Right Distance walked: 10 M Assistive device utilized: Environmental consultant - 2 wheeled and None Level of assistance: Modified independence Comments: difficulty with turns and object navigation per DGI tesitng, able to walk without AD but only in controlled environment without obstacles or significant challenge  FUNCTIONAL TESTs:  5 times sit to stand: 16.95 sec no UE assist 10 meter walk test: .76 m/s without AD  Berg Balance Scale: 44/56 DGI:10  PATIENT SURVEYS:  FOTO to retest next session, likely with either asking patient questions or having caregiver complete patient completed but had 97% indicating he could run and jump with these and when questioned about this following patient reports he cannot run or jump with these and would benefit from filling this out again next time.  12/04/2021: 49 (goal 62)  TODAY'S TREATMENT:    Reviewed safety with HEP due to recent fall when doing STS at home. PT instructed pt to use  a chair with arm-rests as pt DIL reported pt was attempting in chair without arm-rests when he fell from chair. Pt and DIL verbalized understanding.  FOTO: 59  STS: 10x hands-free  Progressed to STS with BLE on airex 2x10. Reports balance is so-so, exhibits mild LOB posteriorly that pt was able to self-correct. Instructed pt in increased anterior-weight shift with STS to avoid this.   On Airex, at support surface: Tandem stance 2x30 sec each LE NBOS EO 60 sec NBOS with vertical and horizontal head turns 10x for each NBOS EC 4x30 sec. Requires up to min assist Ankle rockers 20x. Pt with difficulty achieving DF on RLE. Comments: Pt requires intermittent UE support throughout   [place] hurdle obstacle/foot clearance - x multiple reps with initial focus on RLE only as pt with difficulty clearing hurdle. Pt then performs with LLE. VC/demo throughout for large-amplitude movement to clear RLE. Pt able to intermittently demo correct technique. Has greater difficulty with fatigue.   Seated DF B 20x Standing DF B 20x - cued to slow down, focus on R and L individually for 10 reps each and then together for another set of 20 reps.   Standing heel tap on cone to promote DF- RLE only 2x10. PT provides VC/demo and TC to improve technique.   PT instructed pt and his DIL to have pt follow-up with physician regarding recent fall onto R wrist should pain not improve.    PATIENT EDUCATION: Education details: Pt educated throughout session about proper posture and technique with exercises. Improved exercise technique, movement at target joints, use of target muscles after min to mod verbal, visual, tactile cues. Safety with HEP  Person educated: Patient Education method: Explanation, Demonstration, Tactile cues, and Verbal cues Education comprehension: verbalized understanding, returned demonstration, verbal cues required, tactile cues required, and needs further education  HOME EXERCISE  PROGRAM:   9/11: Pt to continue HEP as previously given. However, PT did provide education to use a chair with arm-rests for lateral support when performing STS   Access Code: [email or web address] URL: [email or web address] Date: 11/24/2021 Prepared by: Rivka Barbara  Exercises - Side Stepping with Counter Support  - 1 x daily - 7 x weekly - 2 sets - 10 reps - Standing Tandem Balance with Counter Support  - 1 x daily - 7 x weekly - 3 sets - 30 second hold - Standing March with Counter Support  - 1 x daily - 7 x weekly - 3 sets - 10 reps - Sit to Stand Without Arm Support  - 1 x daily - 7 x weekly - 2 sets - 10 reps    GOALS: Goals reviewed with patient? Yes  SHORT TERM GOALS: Target date: 12/22/2021       Patient will be independent in home exercise program to improve strength/mobility for better functional independence with ADLs. Baseline: No HEP currently  Goal status: INITIAL  LONG TERM GOALS: Target date: 02/16/2022    1.  Patient (> 71 years old) will complete five times sit to stand test in < 15 seconds indicating an increased LE strength and improved balance. Baseline: 16.95 Goal status: INITIAL  2.  Patient will increase FOTO score to equal to or greater than  60   to demonstrate statistically significant improvement in mobility and quality of life.  Baseline: Test neck session Goal status: INITIAL   3.  Patient will increase Berg Balance score by > 6 points to demonstrate decreased fall risk during functional activities. Baseline: 44 Goal status: INITIAL   4.   Patient will improve DGI by 5 points or more to reduce fall risk and demonstrate improved gait ability. Baseline: 10 Goal status: INITIAL  5.   Patient will increase 10 meter walk test to >1.26ms as to improve gait speed for better community ambulation and to reduce fall risk. Baseline: .751m Goal status: INITIAL   ASSESSMENT:  CLINICAL IMPRESSION: Pt DIL assisted in completing new FOTO  this session for more accurate representation of pt's current functional level (see above). Reviewed safety measures with HEP to reduce risk of falls at home. Pt noted to have significant difficulty with obstacle/foot clearance, so PT initiated instruction in interventions to address this deficit, which included use of large-amplitude cues.. The patient will benefit from skilled physical therapy to improve his gait, balance, mobility, prevent falls, improve his lower extremity strength, and improve his quality of life.   OBJECTIVE IMPAIRMENTS Abnormal gait, cardiopulmonary status limiting activity, decreased activity tolerance, decreased balance, decreased endurance, decreased mobility, difficulty walking, decreased strength, and decreased safety awareness.   ACTIVITY LIMITATIONS lifting, bending, standing, squatting, stairs, transfers, and locomotion level  PARTICIPATION LIMITATIONS: meal prep, shopping, and community activity  PERSONAL FACTORS Age and 3+ comorbidities: left-sided carotid stenosis, non-insulin-dependent diabetes mellitus type 2, PAD, GERD, mild COPD, history of tobacco use, hyperlipidemia, hypertension  are also affecting patient's functional outcome.   REHAB POTENTIAL: Good  CLINICAL DECISION MAKING: Evolving/moderate complexity  EVALUATION COMPLEXITY: Moderate  PLAN: PT FREQUENCY: 2x/week  PT DURATION: 12 weeks  PLANNED INTERVENTIONS: Therapeutic exercises, Therapeutic activity, Neuromuscular re-education, Balance training, Gait training, Patient/Family education, Self Care, Joint mobilization, Stair training, Moist heat, and Manual therapy  PLAN FOR NEXT SESSION: begin POC, focus on R LE NMR to improve proprioception and coordination. May challenge walking without AD as in eval retest focus on therapeutic outcome survey  utilizing caregiver to assist with answering questions, obstacle clearance, dual task, continue plan Zollie Pee, PT 12/04/2021, 12:18 PM

## 2021-12-04 NOTE — Therapy (Signed)
OUTPATIENT OCCUPATIONAL THERAPY NEURO TREATMENT  Patient Name: Jahid Weida MRN: [id number] DOB:[birth year], 76 y.o., male Today's Date: 12/04/2021  PCP: Threasa Alpha, PA REFERRING PROVIDER: Reesa Chew   OT End of Session - 12/04/21 1443     Visit Number 8    Number of Visits 24    Date for OT Re-Evaluation 01/29/22    OT Start Time 20    OT Stop Time 1145    OT Time Calculation (min) 45 min    Activity Tolerance Patient tolerated treatment well    Behavior During Therapy [facility] for tasks assessed/performed            Past Medical History:  Diagnosis Date   Acid reflux    Arthritis    hands   Carotid atherosclerosis    Diabetes mellitus without complication ([place])    Ganglion cyst 02/23/2015   Hyperlipemia    Hypertension    Joint pain in fingers of right hand 02/23/2015   Dominant hand   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 07/11/2015   Prostate cancer ([place])    history   Stroke (cerebrum) ([place]) 10/06/2021   Tachycardia    Tinea pedis of both feet 01/10/2017   Past Surgical History:  Procedure Laterality Date   [place]  2013   PROSTATECTOMY  2012   Patient Active Problem List   Diagnosis Date Noted   Hemiparesis, aphasia, and dysphagia as late effects of stroke ([place]) 11/07/2021   Lower urinary tract symptoms (LUTS) 11/07/2021   Myalgia due to statin 02/21/2021   Type 2 diabetes mellitus without complication, without long-term current use of insulin ([place]) 02/21/2021   Aortic atherosclerosis ([place]) 04/27/2020   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   Onychomycosis of multiple toenails with type 2 diabetes mellitus ([place]) 07/19/2016   Carotid stenosis 03/13/2016   Hyperlipidemia associated with type 2 diabetes mellitus ([place])    Hypertension associated with type 2 diabetes mellitus ([place])    Personal history of prostate cancer    Carotid atherosclerosis    GERD (gastroesophageal  reflux disease) 08/20/2013    ONSET DATE: 10/05/21  REFERRING DIAG: L MCA CVA  THERAPY DIAG:  Muscle weakness (generalized)  Rationale for Evaluation and Treatment Rehabilitation  SUBJECTIVE:   SUBJECTIVE STATEMENT:     PERTINENT HISTORY: Per chart, [name] is a 76 year old right-handed male with history of hypertension, hyperlipidemia, diabetes mellitus, prostate cancer/prostatectomy 2012, quit smoking 24 years ago.  [facility] with right side weakness/facial droop and expressive aphasia.  Blood pressure 155/102.  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  No associated hemorrhage or significant mass effect.    PAIN:  Are you having pain? Yes: Pain location:  right wrist 0/10 pain Pain description:   Aggravating factors:   Relieving factors:    FALLS: Has patient fallen in last 6 months? Yes. Number of falls 3 since CVA  PLOF: Independent/retired Dealer  PATIENT GOALS : Pt points to his hand (wants to be able to use his hand)  OBJECTIVE:   HAND DOMINANCE: Right   FUNCTIONAL OUTCOME MEASURES: FOTO: 48  UPPER EXTREMITY ROM     Active ROM Right eval Left Eval WNL  Shoulder flexion 90 (135)   Shoulder abduction 95 (110)    UPPER EXTREMITY MMT:     MMT Right eval Left Eval 5/5  Shoulder flexion 3-   Shoulder  abduction 3-   Shoulder adduction    Shoulder extension    Shoulder internal rotation 3-   Shoulder external rotation 3-   Middle trapezius    Lower trapezius    Elbow flexion 4-   Elbow extension 4+   Wrist flexion 4   Wrist extension 4-   (Blank rows = not tested)  HAND FUNCTION: Grip strength: Right: 14 lbs; Left: 58 lbs, Lateral pinch: Right: 7 lbs, Left: 19 lbs, and 3 point pinch: Right: 2 lbs, Left: 16 lbs  COORDINATION: Finger Nose Finger test: difficult/lacks precision with reaching targets 9 Hole Peg test: Right: unable sec; Left: 27 sec Able to oppose 2nd and 3rd  digits to thumb on R hand  TODAY'S TREATMENT:   Therapeutic Exercise:  Manual Therapy:  Pt. tolerated retrograde massage for edema control with the right hand. Manual therapy was performed independent of, and in preparation for there. Ex.  Therapeutic exercise:  Patient worked on right hand lateral and three-point pinch strengthening. Patient worked on moving the clips from the lateral pinch position to the three-point pinch position.  Patient required cues to avoid using the left hand to position the clips.   Neuromuscular reeducation:  Patient worked on right hand fine motor coordination grasping and storing grooved pegs.  Patient was able to grasp and store 3 pegs without dropping them through the ulnar aspect of the hand.               Pt. arrived at the therapy session wearing a wrist support brace upside down on his right hand that he had purchased. The patient was wearing a left hand brace on the right hand. Patient presented with edema in his left hand and redness on the dorsal aspect of the hand from the inaccurate brace application. The Patient was provided with, and fit for a right hand brace. The Patient and caregiver education was provided about the proper position, and application of the right hand brace. Patient presented with no pain during the session. Patient responded well to retrograde massage with reduced edema following. Patient is improving with pinch strength. Patient continues to work on right UE strength, grip strength pinch strength fine motor coordination skills In order to improve RUE functioning, and improve engagement in, and maximize independence and ADLs and IADLs.    PATIENT EDUCATION: Education details: HEP progression Person educated: pt Education method: Explanation and Verbal cues, handout Education comprehension: verbalized understanding, returned demo   HOME EXERCISE PROGRAM: theraputty  GOALS: Goals reviewed with patient? Yes  SHORT TERM  GOALS: Target date: 12/18/21  Pt will be indep to perform HEP for increasing strength and coordination throughout RUE.  Baseline: Not yet initiated Goal status: INITIAL  2.  Pt will manage clothing fasteners with extra time, using RUE as an assist  Baseline: unable; pt has elastic laces and wears elastic waisted pants. Goal status: INITIAL  LONG TERM GOALS: Target date: 01/29/22  Pt will increase FOTO score to 55 or better to indicate improved performance with daily tasks.  Baseline: 48 Goal status: INITIAL  2.  Pt will increase R grip strength by 10 or more lbs to enable pt to hold and carry light ADL supplies without dropping.  Baseline: R grip 14#, L 58# Goal status: INITIAL  3.  Pt will increase RUE strength to be able to engage RUE into ADLs at least 50% of the time. Baseline: Pt using L right hand lateral arm to manage ADLs. Goal status: INITIAL  4.  Pt will complete 9 hole peg test on the R in 3 min or less to work towards ability to use R hand to pick up small ADL supplies.  Baseline: Pt can remove a peg but can not pick one up Goal status: INITIAL   ASSESSMENT:  CLINICAL IMPRESSION:   Pt. arrived at the therapy session wearing a wrist support brace upside down on his right hand that he had purchased. The patient was wearing a left hand brace on the right hand. Patient presented with edema in his left hand and redness on the dorsal aspect of the hand from the inaccurate brace application. The Patient was provided with, and fit for a right hand brace. The Patient and caregiver education was provided about the proper position, and application of the right hand brace. Patient presented with no pain during the session. Patient responded well to retrograde massage with reduced edema following. Patient is improving with pinch strength. Patient continues to work on right UE strength, grip strength pinch strength fine motor coordination skills In order to improve RUE functioning, and  improve engagement in, and maximize independence and ADLs and IADLs.    PERFORMANCE DEFICITS in functional skills including ADLs, IADLs, coordination, dexterity, ROM, strength, pain, flexibility, FMC, GMC, mobility, balance, continence, decreased knowledge of use of DME, and UE functional use, cognitive skills including safety awareness, and psychosocial skills including.   IMPAIRMENTS are limiting patient from ADLs, IADLs, leisure, and social participation.   COMORBIDITIES may have co-morbidities  that affects occupational performance. Patient will benefit from skilled OT to address above impairments and improve overall function.  MODIFICATION OR ASSISTANCE TO COMPLETE EVALUATION: Min-Moderate modification of tasks or assist with assess necessary to complete an evaluation.  OT OCCUPATIONAL PROFILE AND HISTORY: Problem focused assessment: Including review of records relating to presenting problem.  CLINICAL DECISION MAKING: Moderate - several treatment options, min-mod task modification necessary  REHAB POTENTIAL: Good  EVALUATION COMPLEXITY: Moderate    PLAN: OT FREQUENCY: 2x/week  OT DURATION: 12 weeks  PLANNED INTERVENTIONS: self care/ADL training, therapeutic exercise, therapeutic activity, neuromuscular re-education, manual therapy, passive range of motion, balance training, functional mobility training, moist heat, cryotherapy, patient/family education, cognitive remediation/compensation, energy conservation, coping strategies training, and DME and/or AE instructions  RECOMMENDED OTHER SERVICES: N/A  CONSULTED AND AGREED WITH PLAN OF CARE: Patient and family member/caregiver  PLAN FOR NEXT SESSION: HEP progression, neuro re-ed, therapeutic exercises  Harrel Carina, MS, OTR/L   Harrel Carina, OT 12/04/2021, 2:46 PM

## 2021-12-06 ENCOUNTER — Ambulatory Visit: Payer: Medicare Other | Admitting: Speech Pathology

## 2021-12-06 ENCOUNTER — Ambulatory Visit: Payer: Medicare Other | Admitting: Occupational Therapy

## 2021-12-06 ENCOUNTER — Ambulatory Visit: Payer: Medicare Other

## 2021-12-06 ENCOUNTER — Encounter: Payer: Self-pay | Admitting: Occupational Therapy

## 2021-12-06 DIAGNOSIS — R278 Other lack of coordination: Secondary | ICD-10-CM

## 2021-12-06 DIAGNOSIS — R2681 Unsteadiness on feet: Secondary | ICD-10-CM

## 2021-12-06 DIAGNOSIS — M6281 Muscle weakness (generalized): Secondary | ICD-10-CM

## 2021-12-06 DIAGNOSIS — R4701 Aphasia: Secondary | ICD-10-CM

## 2021-12-06 DIAGNOSIS — R262 Difficulty in walking, not elsewhere classified: Secondary | ICD-10-CM

## 2021-12-06 DIAGNOSIS — R269 Unspecified abnormalities of gait and mobility: Secondary | ICD-10-CM | POA: Diagnosis not present

## 2021-12-06 DIAGNOSIS — R482 Apraxia: Secondary | ICD-10-CM

## 2021-12-06 NOTE — Therapy (Signed)
OUTPATIENT OCCUPATIONAL THERAPY NEURO TREATMENT  Patient Name: Gerald Bauer MRN: [id number] DOB:1946-03-13, 76 y.o., male Today's Date: 12/06/2021  PCP: Threasa Alpha, PA REFERRING PROVIDER: Reesa Chew   OT End of Session - 12/06/21 1405     Visit Number 9    Number of Visits 24    Date for OT Re-Evaluation 01/29/22    OT Start Time 73    OT Stop Time 1324    OT Time Calculation (min) 45 min    Activity Tolerance Patient tolerated treatment well    Behavior During Therapy 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 07/11/2015   Prostate cancer ([place])    history   Stroke (cerebrum) ([place]) 10/06/2021   Tachycardia    Tinea pedis of both feet 01/10/2017   Past Surgical History:  Procedure Laterality Date   [place]  2013   PROSTATECTOMY  2012   Patient Active Problem List   Diagnosis Date Noted   Hemiparesis, aphasia, and dysphagia as late effects of stroke ([place]) 11/07/2021   Lower urinary tract symptoms (LUTS) 11/07/2021   Myalgia due to statin 02/21/2021   Type 2 diabetes mellitus without complication, without long-term current use of insulin ([employer]) 02/21/2021   Aortic atherosclerosis ([place]) 04/27/2020   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   Onychomycosis of multiple toenails with type 2 diabetes mellitus ([place]) 07/19/2016   Carotid stenosis 03/13/2016   Hyperlipidemia associated with type 2 diabetes mellitus ([place])    Hypertension associated with type 2 diabetes mellitus ([place])    Personal history of prostate cancer    Carotid atherosclerosis    GERD (gastroesophageal  reflux disease) 08/20/2013    ONSET DATE: 10/05/21  REFERRING DIAG: L MCA CVA  THERAPY DIAG:  Muscle weakness (generalized)  Other lack of coordination  Rationale for Evaluation and Treatment Rehabilitation  SUBJECTIVE:    SUBJECTIVE STATEMENT:    Pt. is a Brewing technologist, and has a has a hobby setting up train displays.  PERTINENT HISTORY: Per chart, Gerald Bauer is a 76 year old right-handed male with history of hypertension, hyperlipidemia, diabetes mellitus, prostate cancer/prostatectomy 2012, quit smoking 24 years ago.  Presented to [facility] 10/05/2021 with right side weakness/facial droop and expressive aphasia.  Blood pressure 155/102.  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  No associated hemorrhage or significant mass effect.    PAIN:  Are you having pain? Yes: Pain location:  Ulnar aspect of the right hand 5/10 pain Pain description:   Aggravating factors:   Relieving factors:    FALLS: Has patient fallen in last 6 months? Yes. Number of falls 3 since CVA  PLOF: Independent/retired Dealer  PATIENT GOALS : Pt points to his hand (wants to be able to use his hand)  OBJECTIVE:   HAND DOMINANCE: Right   FUNCTIONAL OUTCOME MEASURES: FOTO: 48  UPPER EXTREMITY ROM     Active ROM Right eval Left Eval WNL  Shoulder flexion 90 (135)   Shoulder abduction  95 (110)    UPPER EXTREMITY MMT:     MMT Right eval Left Eval 5/5  Shoulder flexion 3-   Shoulder abduction 3-   Shoulder adduction    Shoulder extension    Shoulder internal rotation 3-   Shoulder external rotation 3-   Middle trapezius    Lower trapezius    Elbow flexion 4-   Elbow extension 4+   Wrist flexion 4   Wrist extension 4-   (Blank rows = not tested)  HAND FUNCTION: Grip strength: Right: 14 lbs; Left: 58 lbs, Lateral pinch: Right: 7 lbs, Left: 19 lbs, and 3 point pinch: Right: 2 lbs, Left: 16 lbs  COORDINATION: Finger Nose Finger test:  difficult/lacks precision with reaching targets 9 Hole Peg test: Right: unable sec; Left: 27 sec Able to oppose 2nd and 3rd digits to thumb on R hand  TODAY'S TREATMENT:   Therapeutic Exercise:  Therapeutic exercise:  Pt. worked on [employer], and reciprocal motion using the UBE while seated for 8 min. with minimal resistance. Constant monitoring was provided.  Patient worked on right hand lateral and three-point pinch strengthening. Patient worked on moving the clips from the lateral pinch position to the three-point pinch position. Patient required cues to avoid using the left hand to position the clips.   Neuromuscular reeducation:  Patient worked on right hand fine motor coordination skills, and translatory movements of the right hand moving circular spheres clockwise, and counter clockwise in his right hand. Pt. worked on isolating, and alternating a 1" roll distal to proximal in his hand.  Pt. worked on [facility] skills untying knots in rope. Pt. requires cues to engage his right hand when performing the task.              Pt. reports 5/10 pain in the ulnar aspect of the right hand. Pt. is improving with right lateral pinch strength. Pt. was able able to accurately sustain lateral pinch on the green resistive clip in preparation for placing them onto a small horizontal dowel. Pt. is able to move the resistive clips through his hand from one pinch position to the next with increased time, and visual cues. Patient was able to initiate translatory movements moving the circular spheres in his right hand, however fatigues. Pt. presented with difficulty alternating, and isolating the 2nd, 3rd, 4th, and 5th digits to move the 1" roll proximal to distal in his right hand. Patient continues to work on right UE strength, grip strength, pinch strength, and fine motor coordination skills in order to improve RUE functioning, and improve engagement in, and maximize independence with ADLs and IADL  tasks.    PATIENT EDUCATION: Education details: HEP progression Person educated: pt Education method: Explanation and Verbal cues, handout Education comprehension: verbalized understanding, returned demo   HOME EXERCISE PROGRAM: theraputty  GOALS: Goals reviewed with patient? Yes  SHORT TERM GOALS: Target date: 12/18/21  Pt will be indep to perform HEP for increasing strength and coordination throughout RUE.  Baseline: Not yet initiated Goal status: INITIAL  2.  Pt will manage clothing fasteners with extra time, using RUE as an assist  Baseline: unable; pt has elastic laces and wears elastic waisted pants. Goal status: INITIAL  LONG TERM GOALS: Target date: 01/29/22  Pt will increase FOTO score to 55 or better to indicate improved performance with daily tasks.  Baseline: 48 Goal status: INITIAL  2.  Pt will increase R grip strength by 10 or more lbs to enable pt to hold  and carry light ADL supplies without dropping.  Baseline: R grip 14#, L 58# Goal status: INITIAL  3.  Pt will increase RUE strength to be able to engage RUE into ADLs at least 50% of the time. Baseline: Pt using L right hand lateral arm to manage ADLs. Goal status: INITIAL  4.  Pt will complete 9 hole peg test on the R in 3 min or less to work towards ability to use R hand to pick up small ADL supplies.  Baseline: Pt can remove a peg but can not pick one up Goal status: INITIAL   ASSESSMENT:  CLINICAL IMPRESSION:   Pt. reports 5/10 pain in the ulnar aspect of the right hand. Pt. is improving with right lateral pinch strength. Pt. was able able to accurately sustain lateral pinch on the green resistive clip in preparation for placing them onto a small horizontal dowel. Pt. is able to move the resistive clips through his hand from one pinch position to the next with increased time, and visual cues. Patient was able to initiate translatory movements moving the circular spheres in his right hand, however  fatigues.  Pt. presented with difficulty alternating, and isolating the 2nd, 3rd, 4th, and 5th digits to move the 1" roll proximal to distal in his right hand. Patient continues to work on right UE strength, grip strength, pinch strength, and fine motor coordination skills in order to improve RUE functioning, and improve engagement in, and maximize independence with ADLs and IADL tasks.  PERFORMANCE DEFICITS in functional skills including ADLs, IADLs, coordination, dexterity, ROM, strength, pain, flexibility, FMC, GMC, mobility, balance, continence, decreased knowledge of use of DME, and UE functional use, cognitive skills including safety awareness, and psychosocial skills including.   IMPAIRMENTS are limiting patient from ADLs, IADLs, leisure, and social participation.   COMORBIDITIES may have co-morbidities  that affects occupational performance. Patient will benefit from skilled OT to address above impairments and improve overall function.  MODIFICATION OR ASSISTANCE TO COMPLETE EVALUATION: Min-Moderate modification of tasks or assist with assess necessary to complete an evaluation.  OT OCCUPATIONAL PROFILE AND HISTORY: Problem focused assessment: Including review of records relating to presenting problem.  CLINICAL DECISION MAKING: Moderate - several treatment options, min-mod task modification necessary  REHAB POTENTIAL: Good  EVALUATION COMPLEXITY: Moderate    PLAN: OT FREQUENCY: 2x/week  OT DURATION: 12 weeks  PLANNED INTERVENTIONS: self care/ADL training, therapeutic exercise, therapeutic activity, neuromuscular re-education, manual therapy, passive range of motion, balance training, functional mobility training, moist heat, cryotherapy, patient/family education, cognitive remediation/compensation, energy conservation, coping strategies training, and DME and/or AE instructions  RECOMMENDED OTHER SERVICES: N/A  CONSULTED AND AGREED WITH PLAN OF CARE: Patient and family  member/caregiver  PLAN FOR NEXT SESSION: HEP progression, neuro re-ed, therapeutic exercises  Harrel Carina, MS, OTR/L   Harrel Carina, OT 12/06/2021, 2:08 PM

## 2021-12-06 NOTE — Therapy (Signed)
OUTPATIENT SPEECH LANGUAGE PATHOLOGY TREATMENT   Patient Name: Gerald Bauer MRN: [id number] DOB:1945-11-28, 76 y.o., male Today's Date: 12/06/2021  PCP: [name], PA-C REFERRING PROVIDER: Reesa Chew, PA-C   End of Session - 12/06/21 1404     Visit Number 7    Number of Visits 25    Date for SLP Re-Evaluation 02/04/22    SLP Start Time 50    SLP Stop Time  1500    SLP Time Calculation (min) 58 min    Activity Tolerance Patient tolerated treatment well             Past Medical History:  Diagnosis Date   Acid reflux    Arthritis    hands   Carotid atherosclerosis    Diabetes mellitus without complication ([place])    Ganglion cyst 02/23/2015   Hyperlipemia    Hypertension    Joint pain in fingers of right hand 02/23/2015   Dominant hand   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 07/11/2015   Prostate cancer ([place])    history   Stroke (cerebrum) ([place]) 10/06/2021   Tachycardia    Tinea pedis of both feet 01/10/2017   Past Surgical History:  Procedure Laterality Date   [place]  2013   PROSTATECTOMY  2012   Patient Active Problem List   Diagnosis Date Noted   Hemiparesis, aphasia, and dysphagia as late effects of stroke ([place]) 11/07/2021   Lower urinary tract symptoms (LUTS) 11/07/2021   Myalgia due to statin 02/21/2021   Type 2 diabetes mellitus without complication, without long-term current use of insulin ([place]) 02/21/2021   Aortic atherosclerosis ([place]) 04/27/2020   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   Onychomycosis of multiple toenails with type 2 diabetes mellitus ([place]) 07/19/2016   Carotid stenosis 03/13/2016   Hyperlipidemia associated with type 2 diabetes mellitus ([place])    Hypertension associated with type 2 diabetes mellitus ([place])    Personal history of prostate cancer    Carotid atherosclerosis    GERD (gastroesophageal reflux disease) 08/20/2013    ONSET DATE:  10/05/2021   REFERRING DIAG: Cerebral infarction due to unspecified occlusion of left middle cerebral artery  THERAPY DIAG:  Verbal apraxia  Aphasia  Rationale for Evaluation and Treatment Rehabilitation  SUBJECTIVE:   SUBJECTIVE STATEMENT: "[place]." Pt accompanied by: [name] waited in waiting area  PERTINENT HISTORY: Pt  is a 76 year old male with history of left-sided carotid stenosis, non-insulin-dependent diabetes mellitus type 2, PAD, GERD, mild COPD, history of tobacco use, hyperlipidemia, hypertension, who presented 10/05/21 to [facility] ED with facial droop, expressive aphasia, and R sided weakness and found to have left MCA CVA. He was [facility] to [facility] 7/17-10/31/21. Advanced to dysphagia 3/thin liquids by cup by time of d/c from CIR. 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 Difficult to fully assess cognition given severity of linguistic impairment; however, does present with decreased safety awareness, diminished problem-solving, emergent awareness, and mild impulsivity with movement."   DIAGNOSTIC FINDINGS: MBS 10/19/21: "Pt presents with overall mild oropharyngeal dysphagia. Oral phase c/b piecemeal deglutition, decreased mastication, decreased bolus cohesion, premature spillage, and weak lingual manipulation. Pharyngeal phase c/b reduced pharyngeal peristalsis + reduced BOT approximation to PPW, which results in mild to moderate vallecular and pyriform sinuses residuals (vallecula > pyriform sinuses for solids, pyriform sinuses > vallecula for liquids). No penetration  nor aspiration appreciated with thin liquid via cup, Dysphagia 1 (puree), Dysphagia 2 (chopped), Dysphagia 3 (mechanical soft), or with 13 mm Barium tablet placed in puree. Once instance of sensed aspiration with intake of large bolus of thin liquid via straw due to swallow initiation delay to pyriform sinuses and  mistiming of swallow-breath cycle; despite strong reflexive cough aspirant was not fully cleared from trachea. Pt's level of swallow initiation was noted to vary with liquid consumption (over base of epiglottis and at pyriform sinuses) with level of swallow initiation for solids to be consistent at the vallecula. Pharyngeal stasis was partially cleared with reflexive secondary swallow." MRI brain 10/05/21: 1. 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 No associated hemorrhage or significant regional mass effect. 2. Loss of normal flow voids throughout the left ICA and MCA, consistent with slow flow and/or occlusion. 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 3. Underlying mild chronic microvascular ischemic disease. Tiny remote left ACA distribution infarct.   PAIN:  Are you having pain? No Pain location: left shoulder   PATIENT GOALS talk better  OBJECTIVE:   TODAY'S TREATMENT:  Skilled ST treatment targeted apraxia and aphasia with functional naming and repetition. SLP facilitated communication using visual aids (pt's train backdrop photo and key word sheet). Patient required moderate cues to use keyword sheet when naming items in the photo. Some difficulty scanning for items, so SLP simplified visual and had pt name items using a carrier phrase ("This is a ___". He named 1 and 2 syllable objects in the photo using carrier phrase 60% acc, improved to 80% with visual model and cues for slower rate (mod cues). When pt attempted to name objects not listed on key word sheet, he required mod cues to initiate spelling via alphabet board (approximations that SLP was able to decipher ~60% of the time).     PATIENT EDUCATION: Education details: SLOW rate, use of visual supports Person educated: Patient Education method: Explanation, Demonstration, and written supports Education comprehension: verbalized understanding and  needs further education   GOALS: Goals reviewed with patient? Yes  SHORT TERM GOALS: Target date: 10 sessions  Patient will participate in clinical assessment of swallow function with goals added as needed. Baseline: Goal status: MET  2.  Pt will communicate emergency information 100% accuracy using visual aid if necessary.  Baseline:  Goal status: INITIAL  3.  Pt will approximate personally relevant words and phrases >80% accuracy using script training and min visual cues for apraxia.  Baseline:  Goal status: INITIAL  4.  Pt will generate at least 4 descriptors of target word 80% of the time using semantic feature analysis to improve abilities in wordfinding and resolving communication breakdowns.  Baseline:  Goal status: INITIAL  5.  Pt will complete HEP for dysphagia with rare min A. Baseline:  Goal status: INITIAL    LONG TERM GOALS: Target date: 02/04/2022  Pt will engage in 5-8 minutes simple-mod complex conversation re: topic of interest with supported conversation, aphasia compensations.  Baseline:  Goal status: INITIAL  2.  Pt will ID and attempt repair of communication breakdowns >90% of the time in session.   Baseline:  Goal status: INITIAL  3.  Pt/family will demonstrate knowledge of community resources and activities to support language/communication. Baseline:  Goal status: INITIAL  4.  Pt will demonstrate use of swallowing precautions independently with s/sx aspiration <5% of trials. 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 Pt continues to supplement verbal communication with alphabet board during breakdowns, although extended time and mod cues required. With moderate cues and imitation, pt able  to name personally relevant objects (1-2 syllables) using a carrier phrase 80% acc. Patient demonstrates good use of nonverbals and Y/N responses, however is significantly limited in his ability to relay thoughts, feelings, and non-contextual or abstract information. I recommend skilled ST to improve pt's swallowing, language and motor speech skills to increase swallowing safety, improve communication and reduce frustration.   OBJECTIVE IMPAIRMENTS include expressive language, receptive language, aphasia, apraxia, dysarthria, and dysphagia. These impairments are limiting patient from managing medications, managing appointments, managing finances, household responsibilities, ADLs/IADLs, effectively communicating at home and in community, and safety when swallowing. Factors affecting potential to achieve goals and functional outcome are cooperation/participation level; pt appears highly motivated with good family support. Patient will benefit from skilled SLP services to address above impairments and improve overall function.  REHAB POTENTIAL: Excellent  PLAN: SLP FREQUENCY: 2x/week  SLP DURATION: 12 weeks  PLANNED INTERVENTIONS: Aspiration precaution training, Pharyngeal strengthening exercises, Diet toleration management , Language facilitation, Environmental controls, Trials of upgraded texture/liquids, Cognitive reorganization, Internal/external aids, Functional tasks, Multimodal communication approach, SLP instruction and feedback, Compensatory strategies, and Patient/family education    [name], MS, Actor [phone]

## 2021-12-06 NOTE — Therapy (Signed)
OUTPATIENT PHYSICAL THERAPY NEURO TREATMENT   Patient Name: Gerald Bauer MRN: [id number] DOB:06-Sep-1945, 76 y.o., male Today's Date: 12/06/2021   PCP: [name], PA-C REFERRING PROVIDER: Bary Leriche, PA-C   PT End of Session - 12/06/21 1702     Visit Number 3    Number of Visits 24    Date for PT Re-Evaluation 02/16/22    PT Start Time 3086    PT Stop Time 1559    PT Time Calculation (min) 42 min    Equipment Utilized During Treatment Gait belt    Activity Tolerance Patient tolerated treatment well    Behavior During Therapy WFL for tasks assessed/performed               Past Medical History:  Diagnosis Date   Acid reflux    Arthritis    hands   Carotid atherosclerosis    Diabetes mellitus without complication ([place])    Ganglion cyst 02/23/2015   Hyperlipemia    Hypertension    Joint pain in fingers of right hand 02/23/2015   Dominant hand   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 07/11/2015   Prostate cancer ([place])    history   Stroke (cerebrum) ([place]) 10/06/2021   Tachycardia    Tinea pedis of both feet 01/10/2017   Past Surgical History:  Procedure Laterality Date   [place]  2013   PROSTATECTOMY  2012   Patient Active Problem List   Diagnosis Date Noted   Hemiparesis, aphasia, and dysphagia as late effects of stroke ([place]) 11/07/2021   Lower urinary tract symptoms (LUTS) 11/07/2021   Myalgia due to statin 02/21/2021   Type 2 diabetes mellitus without complication, without long-term current use of insulin ([place]) 02/21/2021   Aortic atherosclerosis ([place]) 04/27/2020   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   Onychomycosis of multiple toenails with type 2 diabetes mellitus ([place]) 07/19/2016   Carotid stenosis 03/13/2016   Hyperlipidemia associated with type 2 diabetes mellitus ([place])    Hypertension associated with type 2 diabetes mellitus ([place])    Personal history of  prostate cancer    Carotid atherosclerosis    GERD (gastroesophageal reflux disease) 08/20/2013    ONSET DATE: 10/05/21  REFERRING DIAG: V78.469 (ICD-10-CM) - Cerebral infarction due to unspecified occlusion or stenosis of left middle cerebral artery  THERAPY DIAG:  Unsteadiness on feet  Difficulty in walking, not elsewhere classified  Other lack of coordination  Muscle weakness (generalized)  Rationale for Evaluation and Treatment Rehabilitation  SUBJECTIVE:  SUBJECTIVE STATEMENT: Pt presents still wearing brace. Pt reports pain in R wrist is better. His shoulders hurt B, but unable to rate pain due to communication difficulty. He reports no falls.  Pt accompanied by: self and DIL [name]  PAIN today:  Are you having pain? Yes: NPRS scale: not rated/10 Pain location: R wrist, B shoulders  PERTINENT HISTORY:Per H and P from 7/17 hospital d/c Pt  is a 76 year old male with history of left-sided carotid stenosis, non-insulin-dependent diabetes mellitus type 2, PAD, GERD, mild COPD, history of tobacco use, hyperlipidemia, hypertension, who presents emergency department for chief concerns of facial droop, expressive aphasia, and R sided weakness  PAIN:  Are you having pain? Yes: NPRS scale: 6/10 Pain location: left shoulder, right hand   PRECAUTIONS: Fall  WEIGHT BEARING RESTRICTIONS No  FALLS: Has patient fallen in last 6 months? Yes. Number of falls 1  LIVING ENVIRONMENT: Lives with: lives with their family and lived alone prior to onset  Lives in: House/apartment Stairs: Yes: External: 4 steps; can reach both Has following equipment at home: Walker - 2 wheeled and shower chair  PLOF: Independent  PATIENT GOALS become independent with everyday activities like he was prior to the onset  of the stroke   OBJECTIVE:   DIAGNOSTIC FINDINGS: 1. 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 No associated hemorrhage or significant regional mass effect.  2. Loss of normal flow voids throughout the left ICA and MCA,  consistent with slow flow and/or occlusion. Susceptibility artifact  within proximal left MCA branches consistent with intraluminal  thrombus.  3. Underlying mild chronic microvascular ischemic disease. Tiny  remote left ACA distribution infarct.     MRA HEAD IMPRESSION:   "1. Interval near complete occlusion of the left ICA and MCA,  presumably acute in nature given the presence of the acute left MCA  territory infarct. Left A1 segment not well seen either, also likely  occluded.  2. Otherwise wide patency of the major intracranial arterial  circulation. No other hemodynamically significant or correctable  stenosis.     MRA NECK IMPRESSION:  1. 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 Some attenuated flow seen  distally within the cervical left ICA, with subsequent reocclusion  by the skull base.  2. 50% atheromatous stenosis at the origin of the cervical right  ICA. Otherwise wide patency of the right carotid artery system.  3. Wide patency of the vertebral arteries within the neck.".    COGNITION: Overall cognitive status: Within functional limits for tasks assessed and subjective history limited secondary to aphasia but able to answer 1 word questions appropriately.    SENSATION: WFL  COORDINATION: WNL with heel to shin testing    LOWER EXTREMITY ROM:   AROM WNL    LOWER EXTREMITY MMT:    MMT Right Eval Left Eval  Hip flexion 4 4+  Hip extension    Hip abduction 4 5  Hip adduction 4 5  Hip internal rotation    Hip external rotation    Knee flexion 4 5  Knee extension 4 5  Ankle dorsiflexion 4+ 5  Ankle plantarflexion 4+ 5  Ankle inversion 5 5  Ankle eversion 5 5  (Blank  rows = not tested)   TRANSFERS: Assistive device utilized: Environmental consultant - 2 wheeled  Sit to stand: Modified independence Stand to sit: Complete Independence Chair to chair: SBA  STAIRS:  Level of  Assistance: SBA  Stair Negotiation Technique: Step to Pattern with Bilateral Rails  Number of Stairs: 4   Height of Stairs: 6in  Comments: Right lower extremity foot catches when a sending steps and patient performs descending steps utilizing right lower extremity for more support.  Patient reports he knows he is doing this "the hard way"  GAIT: Gait pattern: decreased arm swing- Right and decreased step length- Right Distance walked: 10 M Assistive device utilized: Environmental consultant - 2 wheeled and None Level of assistance: Modified independence Comments: difficulty with turns and object navigation per DGI tesitng, able to walk without AD but only in controlled environment without obstacles or significant challenge  FUNCTIONAL TESTs:  5 times sit to stand: 16.95 sec no UE assist 10 meter walk test: .76 m/s without AD  Berg Balance Scale: 44/56 DGI:10  PATIENT SURVEYS:  FOTO to retest next session, likely with either asking patient questions or having caregiver complete patient completed but had 97% indicating he could run and jump with these and when questioned about this following patient reports he cannot run or jump with these and would benefit from filling this out again next time.  12/04/2021: 49 (goal 62)  TODAY'S TREATMENT:   [place] hurdle obstacle/foot clearance FWD/BCKWD - x multiple reps with full step over BLE and RLE only. Still knocks into hurdle multiple times. Improves clearance with external cue/target to aim for both forward and back  Seated DF 20x each LE  Standing heel raise 20x B Single leg heel raise 15x each LE. Difficult on R Standing DF - 20x Difficult/decreased AROM B  STS with BLE on airex 2x12. Continues to exhibit posterior LOB occ. PT cues for anterior weight-shift    Performed 3rd set of 12 reps with posterior perturbation  Seated R DF only 10x 10 sec holds. Challenging   On Airex, at support surface: NBOS EC 4x30 sec. Improved. CGA only Tandem stance 2x30 sec each LE Slow marches 2x20 alt LE - difficult   RLE proprioception exercise - point foot toward objects attempting with EC x multiple reps, accurate with each rep.   Cone tap exercise: start with tapping 1 cone at a time then PT calling out sequence to tap. Pt knocks over cones, has some difficulty with sequencing.   PATIENT EDUCATION: Education details: Pt educated throughout session about proper posture and technique with exercises. Improved exercise technique, movement at target joints, use of target muscles after min to mod verbal, visual, tactile cues.   Person educated: Patient Education method: Explanation, Demonstration, Tactile cues, and Verbal cues Education comprehension: verbalized understanding, returned demonstration, verbal cues required, tactile cues required, and needs further education   HOME EXERCISE PROGRAM:  No updates today, pt to continue as previously given   9/11: Pt to continue HEP as previously given. However, PT did provide education to use a chair with arm-rests for lateral support when performing STS   Access Code: [email or web address] URL: [email or web address] Date: 11/24/2021 Prepared by: Rivka Barbara  Exercises - Side Stepping with Counter Support  - 1 x daily - 7 x weekly - 2 sets - 10 reps - Standing Tandem Balance with Counter Support  - 1 x daily - 7 x weekly - 3 sets - 30 second hold - Standing March with Counter Support  - 1 x daily - 7 x weekly - 3 sets - 10 reps - Sit to Stand Without Arm Support  - 1 x daily - 7 x weekly - 2 sets - 10 reps  GOALS: Goals reviewed with patient? Yes  SHORT TERM GOALS: Target date: 12/22/2021       Patient will be independent in home exercise program to improve strength/mobility for better  functional independence with ADLs. Baseline: No HEP currently  Goal status: INITIAL  LONG TERM GOALS: Target date: 02/16/2022    1.  Patient (> 56 years old) will complete five times sit to stand test in < 15 seconds indicating an increased LE strength and improved balance. Baseline: 16.95 Goal status: INITIAL  2.  Patient will increase FOTO score to equal to or greater than  60   to demonstrate statistically significant improvement in mobility and quality of life.  Baseline: Test neck session Goal status: INITIAL   3.  Patient will increase Berg Balance score by > 6 points to demonstrate decreased fall risk during functional activities. Baseline: 44 Goal status: INITIAL   4.   Patient will improve DGI by 5 points or more to reduce fall risk and demonstrate improved gait ability. Baseline: 10 Goal status: INITIAL  5.   Patient will increase 10 meter walk test to >1.61ms as to improve gait speed for better community ambulation and to reduce fall risk. Baseline: .770m Goal status: INITIAL   ASSESSMENT:  CLINICAL IMPRESSION: Pt shows progress by sustaining EC on compliant surface with no more than CGA today, indicating improved use of vestibular cues to maintain balance.  The pt also exhibited good use of proprioceptive cues with RLE intervention. While pt shows progress, he continues to have difficulty with SLB tasks and generating sufficient anterior weight-shift to safely perform STS without UE support. The patient will benefit from skilled physical therapy to improve his gait, balance, mobility, prevent falls, improve his lower extremity strength, and improve his quality of life.   OBJECTIVE IMPAIRMENTS Abnormal gait, cardiopulmonary status limiting activity, decreased activity tolerance, decreased balance, decreased endurance, decreased mobility, difficulty walking, decreased strength, and decreased safety awareness.   ACTIVITY LIMITATIONS lifting, bending, standing,  squatting, stairs, transfers, and locomotion level  PARTICIPATION LIMITATIONS: meal prep, shopping, and community activity  PERSONAL FACTORS Age and 3+ comorbidities: left-sided carotid stenosis, non-insulin-dependent diabetes mellitus type 2, PAD, GERD, mild COPD, history of tobacco use, hyperlipidemia, hypertension  are also affecting patient's functional outcome.   REHAB POTENTIAL: Good  CLINICAL DECISION MAKING: Evolving/moderate complexity  EVALUATION COMPLEXITY: Moderate  PLAN: PT FREQUENCY: 2x/week  PT DURATION: 12 weeks  PLANNED INTERVENTIONS: Therapeutic exercises, Therapeutic activity, Neuromuscular re-education, Balance training, Gait training, Patient/Family education, Self Care, Joint mobilization, Stair training, Moist heat, and Manual therapy  PLAN FOR NEXT SESSION: begin POC, focus on R LE NMR to improve proprioception and coordination, obstacle clearance, dual task, continue plan HaZollie PeePT 12/06/2021, 5:10 PM

## 2021-12-07 ENCOUNTER — Ambulatory Visit: Payer: Medicare Other | Admitting: Neurology

## 2021-12-07 ENCOUNTER — Encounter: Payer: Self-pay | Admitting: Neurology

## 2021-12-07 ENCOUNTER — Encounter: Payer: Self-pay | Admitting: Family Medicine

## 2021-12-07 VITALS — BP 123/85 | HR 107 | Ht 67.0 in | Wt 163.0 lb

## 2021-12-07 DIAGNOSIS — R4701 Aphasia: Secondary | ICD-10-CM

## 2021-12-07 DIAGNOSIS — I69391 Dysphagia following cerebral infarction: Secondary | ICD-10-CM

## 2021-12-07 DIAGNOSIS — I69359 Hemiplegia and hemiparesis following cerebral infarction affecting unspecified side: Secondary | ICD-10-CM

## 2021-12-07 DIAGNOSIS — Z8673 Personal history of transient ischemic attack (TIA), and cerebral infarction without residual deficits: Secondary | ICD-10-CM | POA: Diagnosis not present

## 2021-12-07 DIAGNOSIS — I69320 Aphasia following cerebral infarction: Secondary | ICD-10-CM

## 2021-12-07 NOTE — Progress Notes (Deleted)
Subjective:    Patient ID: Gerald Bauer is a 76 y.o. male.  HPI {Common ambulatory SmartLinks:[id number]}  Review of Systems  Objective:  Neurological Exam  Physical Exam  Assessment:   ***  Plan:   ***

## 2021-12-07 NOTE — Patient Instructions (Addendum)
It was nice to see you today. I am glad to hear that you are making progress and getting stronger.  Fall risk is real! Please remember to stand up slowly and get your bearings first turn slowly, no bending down to pick anything, no heavy lifting, be extra careful at night and first thing in the morning. Also, be careful in the Bathroom and the kitchen. Use your walker at all times.  Please drink more water, about 3-4 bottles per day.   Take your medications as directed. As discussed, secondary prevention is key after a stroke. This means: taking care of blood sugar values or diabetes management (A1c goal of less than 7.0), good blood pressure (hypertension) control and optimizing cholesterol management (with LDL goal of less than 70), exercising daily or regularly within your own mobility limitations of course.   I did not refill your Plavix, as your primary care, [name], PA took over the prescription in August.    Please follow up to see one of our nurse practitioners in 6 months, sooner if needed.

## 2021-12-07 NOTE — Progress Notes (Signed)
Subjective:    Patient ID: Gerald Bauer is a 76 y.o. male.  HPI    Star Age, MD, PhD [facility] [address] [place], [place] [address]  I saw patient, Gerald Bauer, as a new patient referral from the hospital after recent stroke.  The patient is accompanied by his son, Gerald Bauer, today.  [name] is a 75 year old right-handed gentleman with an underlying medical history of hypertension, hyperlipidemia, prostate cancer, left carotid atherosclerosis, diabetes, smoking arthritis, reflux disease, and mildly overweight state, who presented to the emergency room or hospital ED on 10/05/2021 with right-sided weakness and facial droop as well as aphasia.  He was noted to have an elevated blood pressure.  He was in inpatient rehab from 10/09/2021 through 10/31/2021.  He is currently in outpatient rehab.  He is on baby aspirin and Plavix indefinitely.  I reviewed the rehab medical records as well as the inpatient records.  He was in the hospital from 10/05/2021 through 10/09/2021.  He had a head CT without contrast on 10/05/2021 and I reviewed the results: IMPRESSION: Mild hypoattenuation in the region of the inferior left frontal lobe and anterior left insula. This may be artifactual given suspected streak artifact in this region; however, if there is concern for left MCA territory infarct then recommend MRI for more sensitive evaluation. He had a brain MRI without contrast as well as an MR angiogram of the head without contrast and MRA neck with and without contrast on  10/05/2021 and I reviewed the results: IMPRESSION: MRI HEAD IMPRESSION:   1. 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 No associated hemorrhage or significant regional mass effect. 2. Loss of normal flow voids throughout the left ICA and MCA, consistent with slow flow and/or occlusion. Susceptibility artifact within proximal left MCA branches consistent with  intraluminal thrombus. 3. Underlying mild chronic microvascular ischemic disease. Tiny remote left ACA distribution infarct.   MRA HEAD IMPRESSION:   1. 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 Left A1 segment not well seen either, also likely occluded. 2. Otherwise wide patency of the major intracranial arterial circulation. No other hemodynamically significant or correctable stenosis.   MRA NECK IMPRESSION:   1. 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 Some attenuated flow seen distally within the cervical left ICA, with subsequent reocclusion by the skull base. 2. 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 Otherwise wide patency of the right carotid artery system. 3. Wide patency of the vertebral arteries within the neck.  He had a swallow study on 10/19/2021 which showed mild dysphagia.  He has been on Repatha.  A1c was 7.1, LDL 44.  Echocardiogram from 10/06/2021 showed an EF of 55 to 60%, normal left ventricular function, no regional wall motion abnormalities, grade 1 diastolic dysfunction.  No regurgitation or stenosis, aortic valve sclerosis was seen, no evidence of aortic stenosis.    I had evaluated him for memory loss in 2015.  He did not return for follow-up after that.   He reports feeling better.  Son reports that they had to move in with them after the stroke.  Patient's wife passed away.  He lived on his own until the stroke.  He has a daughter as well.  He does need help navigate some steps  to get into the house but it is a single-story home.  He fell recently but did not hit his head, hit his right rib cage against the coffee table, he missed the chair as he was sitting down.  He has benefited from outpatient therapy, has regular PT, OT and ST at [facility].  He has been able  to advance to regular diet, no choking.  He does not always drink enough water, estimates that he drinks about 1 or 2 bottles per day.  He drinks a cup of coffee in the morning and 1 soda per day, occasional tea.  He sleeps fairly well, no significant snoring.  He is followed by cardiology.  He is not driving.  They are wondering if he could drive again.  He is advised currently not to drive.  They are requesting a disability placard for their car.  I would be happy to provide a placard temporarily for 3 months.  He has been on Plavix, seems to tolerate the medication well, it was renewed by his primary care PA recently.  He uses a 2 wheeled walker.  His history is primarily provided by his son.  Patient has significant expressive aphasia. He quit smoking some 20 or 30 years ago, currently does not drink any alcohol.  He has had intermittent dull headaches in the back of his head, not severe, not enough to take medication for this.  He has had issues with constipation since his hospitalization.  Previously (copied from previous notes for reference):    03/09/14: 76 year old right-handed gentleman with an underlying medical history of hypertension, hyperlipidemia, reflux disease, arthritis and prostate cancer, status post prostatectomy,  cholecystectomy and appendectomy, who reports memory loss for the past year. Recent laboratory test results from 02/15/2014 included a CBC with differential which was unremarkable, total cholesterol elevated at 261 with LDL elevated at 190, CMP normal with blood sugar at 104, TSH normal at 2.45, vitamin D mildly low at 25.9, B12 of 376. He had a carotid Doppler ultrasound on 08/26/2013 which showed no significant stenosis of the right internal carotid artery and approximately 50% stenosis of the left internal carotid artery.  He has never had a brain scan.  He does not have a family history of dementia. His mother lived to be 41 years old and had some memory loss in her  early 65s. His father lived to be 52 and died of a heart attack. The patient has been on B12 and vitamin D orally, over-the-counter since his latest blood work. He's going to see a new cardiologist soon. He has been driving without difficulties. There is no report of hallucinations or delusions or significant mood disorder. 3 years ago they bought a motor home and have been traveling with it. He has been able to drive it without any difficulties. They have a trip planned to [place] later this month. He has 2 children. 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 He now works part-time. 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 He is active. His wife does not feel there is a big problem with his memory. He drinks caffeine regularly. He stopped drinking alcohol when he was started on blood pressure medication. There was no history of heavy drinking. He snores some and has difficulty maintaining sleep. He denies apneas or gasping sensations while asleep. He drinks 4 cups of coffee every  day and 1 soda.   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 07/11/2015   Prostate cancer ([place])    history   Stroke (cerebrum) ([place]) 10/06/2021   Tachycardia    Tinea pedis of both feet 01/10/2017    His Past Surgical History Is Significant For: Past Surgical History:  Procedure Laterality Date   [place]  2013   PROSTATECTOMY  2012    His Family History Is Significant For: Family History  Problem Relation  Age of Onset   Dementia Mother    High Cholesterol Mother    Hypertension Mother    Arthritis Mother    Hyperlipidemia Mother    Heart disease Father    Heart attack Father    Arthritis Maternal Grandmother    Cancer Maternal Grandmother        colon   Prostate cancer Brother    Cancer Brother        prostate   Cancer Maternal Uncle        colon   COPD Maternal Uncle    Stroke Paternal Grandmother    Diabetes Paternal Grandmother    Aneurysm Paternal Grandfather     His Social History Is Significant For: Social History   Socioeconomic History   Marital status: Widowed    Spouse name: Regino Schultze   Number of children: 2   Years of education: some college   Highest education level: 12th grade  Occupational History   Occupation: Retired    Comment: [employer]  Tobacco Use   Smoking status: Former    Packs/day: 1.00    Years: 40.00    Total pack years: 40.00    Types: Cigarettes    Quit date: 03/09/1997    Years since quitting: 24.7   Smokeless tobacco: Former   Tobacco comments:    smoking cessation materials not required  Vaping Use   Vaping Use: Never used  Substance and Sexual Activity   Alcohol use: No    Alcohol/week: 0.0 standard drinks of alcohol   Drug use: No   Sexual activity: Never  Other Topics Concern   Not on file  Social History Narrative   Pt lives alone   Social Determinants of Health   Financial Resource Strain: Low Risk  (09/14/2021)   Overall Financial Resource Strain (CARDIA)    Difficulty of Paying Living Expenses: Not hard at all  Food Insecurity: No Food Insecurity (09/14/2021)   Hunger Vital Sign    Worried About Running Out of Food in the Last Year: Never true    Standard in the Last Year: Never true  Transportation Needs: No Transportation Needs (09/14/2021)   PRAPARE - Hydrologist (Medical): No    Lack of Transportation (Non-Medical): No  Physical Activity: Sufficiently Active (09/14/2021)    Exercise Vital Sign    Days of Exercise per Week: 6 days    Minutes of Exercise per Session: 40 min  Stress: No Stress Concern Present (09/14/2021)   [employer] of  Occupational Health - Occupational Stress Questionnaire    Feeling of Stress : Not at all  Social Connections: Moderately Integrated (09/14/2021)   Social Connection and Isolation Panel [NHANES]    Frequency of Communication with Friends and Family: Three times a week    Frequency of Social Gatherings with Friends and Family: Three times a week    Attends Religious Services: 1 to 4 times per year    Active Member of Clubs or Organizations: No    Attends Archivist Meetings: 1 to 4 times per year    Marital Status: Widowed    His Allergies Are:  Allergies  Allergen Reactions   Amoxicillin Swelling   Statins Other (See Comments)    Affects the liver   Welchol [Colesevelam Hcl] Other (See Comments)    affects the liver  :   His Current Medications Are:  Outpatient Encounter Medications as of 12/07/2021  Medication Sig   acetaminophen (TYLENOL) 325 MG tablet Take 2 tablets (650 mg total) by mouth every 4 (four) hours as needed for mild pain (or temp > 37.5 C (99.5 F)). (Patient taking differently: Take 650 mg by mouth as needed for mild pain (or temp > 37.5 C (99.5 F)).)   albuterol (VENTOLIN HFA) 108 (90 Base) MCG/ACT inhaler Inhale 2 puffs into the lungs every 6 (six) hours as needed for wheezing or shortness of breath.   amLODipine (NORVASC) 5 MG tablet Take 1 tablet (5 mg total) by mouth daily.   aspirin EC 81 MG tablet Take 81 mg by mouth daily. Swallow whole.   Cholecalciferol (VITAMIN D3) 25 MCG (1000 UT) CAPS Take 1,000 Units by mouth daily.   clopidogrel (PLAVIX) 75 MG tablet Take 1 tablet (75 mg total) by mouth daily.   Cyanocobalamin (VITAMIN B12) 500 MCG TABS Take 500 mcg by mouth daily.   Lancets (ONETOUCH DELICA PLUS [id number]) MISC USE TO CHECK BLOOD GLUCOSE  ONCE DAILY AS DIRECTED   magnesium  gluconate (MAGONATE) 500 MG tablet Take 1 tablet (500 mg total) by mouth at bedtime.   metFORMIN (GLUCOPHAGE-XR) 500 MG 24 hr tablet Take 1 tablet (500 mg total) by mouth 2 (two) times daily with a meal.   ONETOUCH VERIO test strip CHECK FINGERSTICK BLOOD  SUGARS ONCE DAILY   REPATHA SURECLICK 628 MG/ML SOAJ INJECT '140MG'$  SUBCUTANEOUSLY  EVERY 2 WEEKS   tamsulosin (FLOMAX) 0.4 MG CAPS capsule Take 1 capsule (0.4 mg total) by mouth at bedtime.   No facility-administered encounter medications on file as of 12/07/2021.  :   Review of Systems:  Out of a complete 14 point review of systems, all are reviewed and negative with the exception of these symptoms as listed below:  Review of Systems  Neurological:        Pt here for stroke f/u  Pt states headaches,slurred speech and gait is off.  Pt states needs refill on Plavix and magnesium Gluconate     Objective:  Neurological Exam  Physical Exam Physical Examination:   Vitals:   12/07/21 1350  BP: 123/85  Pulse: (!) 107    General Examination: The patient is a very pleasant 76 y.o. male in no acute distress. He appears well-developed and well-nourished and well groomed.   HEENT: Normocephalic, atraumatic, pupils are equal, round and reactive to light, mild right lower facial weakness.  Tongue deviates slightly to the right.  Airway examination reveals mild mouth dryness, no carotid bruits noted.  Dysarthria noted.  Significant expressive aphasia.  Chest: Clear to auscultation without wheezing, rhonchi or crackles noted.  Heart: S1+S2+0, regular and normal without murmurs, rubs or gallops noted.   Abdomen: Soft, non-tender and non-distended with normal bowel sounds appreciated.  Extremities: There is no pitting edema in the distal lower extremities bilaterally.   Skin: Warm and dry without trophic changes noted.   Musculoskeletal: exam reveals right wrist brace.   Neurologically:  Mental status: The patient is awake, alert and  oriented in all 4 spheres.  Significant expressive aphasia.  Mild dysarthria. Thought process is linear. Mood is normal and affect is normal.  Cranial nerves II - XII are as described above under HEENT exam.  Motor exam: Normal bulk, strength and tone is noted on the left, 4 out of 5 weakness in the right upper and lower extremities, grip strength weaker at 3-4 out of 5 on the right, no obvious tremor, Romberg not tested for safety concerns.  Reflexes are 1-2+ throughout.  Sensory exam intact to light touch.  Difficulty with fine motor skills right upper extremity. Cerebellar testing shows no ataxia or dysmetria.  Sensory exam: intact to light touch in the upper and lower extremities.  Gait, station and balance: He stands with mild difficulty, does not require assistance.  He walks with his 2 wheeled walker, no foot drop.  Assessment and Plan:  In summary, Jenesis Suchy is a very pleasant 76 y.o.-year old male with an underlying medical history of hypertension, hyperlipidemia, prostate cancer, left carotid atherosclerosis, diabetes, smoking arthritis, reflux disease, and mildly overweight state, who presents for evaluation of his left MCA stroke in July 2023.  He had inpatient rehab and is currently in outpatient rehab.  He is on dual antiplatelet therapy with baby aspirin and Plavix.  Prescription was renewed by his primary care last month.  He has seen cardiology, he is on Repatha.  A1c at goal, LDL at goal.  Completed in the hospital.  No significant snoring was noted, no difficulty sleeping.  He has progressed to tolerate regular diet.  He is advised to stay better hydrated with water and increase his water intake.  He is advised to use his walker at all times and reminded that he does have a higher risk of falls.  I filled out a disability placard form for the DM[employer] for them to get a 42-monthplacard.  He is advised to follow-up routinely in this clinic for a recheck with one of our nurse practitioners  in about 6 months, sooner if needed.  If all goes well and he has made good progress we can hopefully release him back to primary care at that point.  I answered all their questions today and he was in agreement.  This was an extended visit of over 65 minutes, including copious record review.  SStar Age MD, PhD

## 2021-12-08 ENCOUNTER — Ambulatory Visit: Payer: Medicare Other | Admitting: Physical Therapy

## 2021-12-11 ENCOUNTER — Ambulatory Visit: Payer: Medicare Other | Admitting: Speech Pathology

## 2021-12-11 ENCOUNTER — Ambulatory Visit: Payer: Medicare Other

## 2021-12-11 DIAGNOSIS — R269 Unspecified abnormalities of gait and mobility: Secondary | ICD-10-CM | POA: Diagnosis not present

## 2021-12-11 DIAGNOSIS — I63512 Cerebral infarction due to unspecified occlusion or stenosis of left middle cerebral artery: Secondary | ICD-10-CM

## 2021-12-11 DIAGNOSIS — R262 Difficulty in walking, not elsewhere classified: Secondary | ICD-10-CM

## 2021-12-11 DIAGNOSIS — R278 Other lack of coordination: Secondary | ICD-10-CM

## 2021-12-11 DIAGNOSIS — R2681 Unsteadiness on feet: Secondary | ICD-10-CM

## 2021-12-11 DIAGNOSIS — M6281 Muscle weakness (generalized): Secondary | ICD-10-CM

## 2021-12-11 DIAGNOSIS — R4701 Aphasia: Secondary | ICD-10-CM

## 2021-12-11 DIAGNOSIS — R482 Apraxia: Secondary | ICD-10-CM

## 2021-12-11 NOTE — Therapy (Signed)
OUTPATIENT OCCUPATIONAL THERAPY PROGRESS NOTE AND NEURO TREATMENT Reporting period 11/06/21-12/11/21  Patient Name: Gerald Bauer MRN: [id number] DOB:February 14, 1946, 76 y.o., male Today's Date: 12/11/2021  PCP: Threasa Alpha, PA REFERRING PROVIDER: Reesa Chew   OT End of Session - 12/11/21 1050     Visit Number 10    Number of Visits 24    Date for OT Re-Evaluation 01/29/22    Authorization Time Period Reporting period beginning 11/06/21    OT Start Time 32    OT Stop Time 1145    OT Time Calculation (min) 45 min    Activity Tolerance Patient tolerated treatment well    Behavior During Therapy [facility] for tasks assessed/performed            Past Medical History:  Diagnosis Date   Acid reflux    Arthritis    hands   Carotid atherosclerosis    Diabetes mellitus without complication ([place])    Ganglion cyst 02/23/2015   Hyperlipemia    Hypertension    Joint pain in fingers of right hand 02/23/2015   Dominant hand   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 07/11/2015   Prostate cancer ([place])    history   Stroke (cerebrum) ([place]) 10/06/2021   Tachycardia    Tinea pedis of both feet 01/10/2017   Past Surgical History:  Procedure Laterality Date   [place]  2013   PROSTATECTOMY  2012   Patient Active Problem List   Diagnosis Date Noted   Hemiparesis, aphasia, and dysphagia as late effects of stroke ([place]) 11/07/2021   Lower urinary tract symptoms (LUTS) 11/07/2021   Myalgia due to statin 02/21/2021   Type 2 diabetes mellitus without complication, without long-term current use of insulin ([place]) 02/21/2021   Aortic atherosclerosis ([place]) 04/27/2020   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   Onychomycosis of multiple toenails with type 2 diabetes mellitus ([place]) 07/19/2016   Carotid stenosis 03/13/2016   Hyperlipidemia associated with type 2 diabetes mellitus ([place])    Hypertension associated with type 2  diabetes mellitus ([place])    Personal history of prostate cancer    Carotid atherosclerosis    GERD (gastroesophageal reflux disease) 08/20/2013    ONSET DATE: 10/05/21  REFERRING DIAG: L MCA CVA  THERAPY DIAG:  Muscle weakness (generalized)  Other lack of coordination  Left middle cerebral artery stroke ([place])  Rationale for Evaluation and Treatment Rehabilitation  SUBJECTIVE:    SUBJECTIVE STATEMENT: Son present with pt today.  Pt reporting a lot of pain in wrist and shoulder.  Son states that he's not exercising his arm much at home but that he constantly sees pt try to use it with daily tasks.     PERTINENT HISTORY: Per chart, Gerald Bauer is a 76 year old right-handed male with history of hypertension, hyperlipidemia, diabetes mellitus, prostate cancer/prostatectomy 2012, quit smoking 24 years ago.  Presented to [facility] 10/05/2021 with right side weakness/facial droop and expressive aphasia.  Blood pressure 155/102.  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  No associated hemorrhage or significant mass effect.    PAIN:  Are you having pain? Yes: Pain location:  R wrist 8/10, R shoulder 5/10 pain Pain description: unable to verbalize Aggravating factors: movement Relieving factors: use of wrist brace; heat  FALLS: Has patient fallen in last 6 months? Yes. Number of falls 3 since CVA  PLOF: Independent/retired Dealer  PATIENT GOALS :  Pt points to his hand (wants to be able to use his hand)  OBJECTIVE:   HAND DOMINANCE: Right   FUNCTIONAL OUTCOME MEASURES: FOTO: 48  UPPER EXTREMITY ROM     Active ROM Right eval Left Eval WNL Right 12/11/21  Shoulder flexion 90 (135)  95 (110)  Shoulder abduction 95 (110)  80 (90)  Wrist flex   54  Wrist ext   41   UPPER EXTREMITY MMT:     MMT Right eval Left Eval 5/5 Right 12/11/21  Shoulder flexion 3-  -3  Shoulder abduction 3-  -3  Shoulder adduction     Shoulder extension      Shoulder internal rotation 3-    Shoulder external rotation 3-    Middle trapezius     Lower trapezius     Elbow flexion 4-  4  Elbow extension 4+  5  Wrist flexion 4  NT d/t pain  Wrist extension 4-  NT d/t pain  (Blank rows = not tested)  HAND FUNCTION: Eval:        Grip strength: Right: 14 lbs; Left: 58 lbs, Lateral pinch: Right: 7 lbs, Left: 19 lbs, and 3 point pinch: Right: 2 lbs, Left: 16 lbs 10th visit: Grip strength: Right: 16  lbs;             Lateral pinch: Right: 9  lbs;             3 point pinch: Right: 5 lbs  Eval: COORDINATION: Finger Nose Finger test: difficult/lacks precision with reaching targets 9 Hole Peg test: Right: unable sec; Left: 27 sec Able to oppose 2nd and 3rd digits to thumb on R hand  10th visit 9 hole Peg test: Right: 11 min, 44 sec     TODAY'S TREATMENT:   Therapeutic Exercise: Objective measures taken and goals updated for progress note.  Instructed pt in table slides for R shoulder flexion, and self passive stretching for R wrist and digit flex/extension with good return demo.  Reinforced importance of keeping RUE joints mobile for reducing pain and perform exercises as tolerated.  Encouraged putty use 1-2x daily; pt and son both report that pt has used his putty a few times, but verbalized understanding of need to increase use to daily.     PATIENT EDUCATION: Education details: HEP progression Person educated: pt Education method: Explanation and Verbal cues, handout Education comprehension: verbalized understanding, returned demo   HOME EXERCISE PROGRAM: Theraputty, table slides, self passive stretching for the R wrist and digit flex/ext  GOALS: Goals reviewed with patient? Yes  SHORT TERM GOALS: Target date: 12/18/21  Pt will be indep to perform HEP for increasing strength and coordination throughout RUE.  Baseline: Not yet initiated; 10th: putty given but pt reports limited use, instructed in table slides and self PROM for R  wrist/digits.   Goal status: ongoing  2.  Pt will manage clothing fasteners with extra time, using RUE as an assist  Baseline: unable; pt has elastic laces and wears elastic waisted pants; 10th: pt uses R hand as an assist to zip and button jeans with extra time; not yet tried small buttons on a shirt.  Pt continues to use elastic laces on shoes. Goal status: ongoing  LONG TERM GOALS: Target date: 01/29/22  Pt will increase FOTO score to 55 or better to indicate improved performance with daily tasks.  Baseline: 48; 10th: 48 Goal status: ongoing  2.  Pt will increase R grip strength by 10 or  more lbs to enable pt to hold and carry light ADL supplies without dropping.  Baseline: R grip 14#, L 58#; 10th visit: R grip 16 Goal status: INITIAL  3.  Pt will increase RUE strength to be able to engage RUE into ADLs at least 50% of the time. Baseline: Pt using L non-dominant arm to manage ADLs; 10th visit: Son reports he constantly sees pt attempt to use his R arm for daily tasks, but continues to be limited and still must use LUE for most tasks (see chart for RUE MMT) Goal status: ongoing  4.  Pt will complete 9 hole peg test on the R in 3 min or less to work towards ability to use R hand to pick up small ADL supplies.  Baseline: Pt can remove a peg but can not pick one up; 10th visit: R 9 hole completion 11 min 44 sec Goal status: ongoing   ASSESSMENT:  CLINICAL IMPRESSION: Pt seen for 10th visit progress update.  Son present and able to assist with answering functional performance questions d/t pt's expressive aphasia.  Pt reporting high pain levels today in the R wrist at 8/10, and 5/10 pain in the R shoulder.  Both are stiff with significant ROM limitations (see chart).  Pt admitted to little use of theraputty at home, and not really stretching the R shoulder.  OT instructed today in table slides and self PROM for the wrist and hand with good return demo and encouraged pt begin using putty  1-2x daily for loosening up the hand and increasing strength/coordination.  Son reports that he sees pt constantly make attempts at using the RUE for self care tasks, but also acknowledged importance of increased participation in HEP for making faster gains with strength and coordination throughout the RUE.  Great improvement with small object manipulation as noted by pt unable to pick up any pegs at eval, but able to remove 1 at that time.  Today, pt was able to place all pegs and remove all.  9 hole peg test time extensive (>11 min) but pt was persistent.  Patient continues to work on right UE strength, grip strength, pinch strength, pain management, and fine motor coordination skills in order to improve RUE functioning, and improve engagement in, and maximize independence with ADLs and IADL tasks.  PERFORMANCE DEFICITS in functional skills including ADLs, IADLs, coordination, dexterity, ROM, strength, pain, flexibility, FMC, GMC, mobility, balance, continence, decreased knowledge of use of DME, and UE functional use, cognitive skills including safety awareness, and psychosocial skills including.   IMPAIRMENTS are limiting patient from ADLs, IADLs, leisure, and social participation.   COMORBIDITIES may have co-morbidities  that affects occupational performance. Patient will benefit from skilled OT to address above impairments and improve overall function.  MODIFICATION OR ASSISTANCE TO COMPLETE EVALUATION: Min-Moderate modification of tasks or assist with assess necessary to complete an evaluation.  OT OCCUPATIONAL PROFILE AND HISTORY: Problem focused assessment: Including review of records relating to presenting problem.  CLINICAL DECISION MAKING: Moderate - several treatment options, min-mod task modification necessary  REHAB POTENTIAL: Good  EVALUATION COMPLEXITY: Moderate    PLAN: OT FREQUENCY: 2x/week  OT DURATION: 12 weeks  PLANNED INTERVENTIONS: self care/ADL training, therapeutic  exercise, therapeutic activity, neuromuscular re-education, manual therapy, passive range of motion, balance training, functional mobility training, moist heat, cryotherapy, patient/family education, cognitive remediation/compensation, energy conservation, coping strategies training, and DME and/or AE instructions  RECOMMENDED OTHER SERVICES: N/A  CONSULTED AND AGREED WITH PLAN OF CARE: Patient and family  member/caregiver  PLAN FOR NEXT SESSION: HEP progression, neuro re-ed, therapeutic exercises  Leta Speller, MS, OTR/L   Darleene Cleaver, OT 12/11/2021, 5:12 PM

## 2021-12-11 NOTE — Therapy (Signed)
OUTPATIENT SPEECH LANGUAGE PATHOLOGY TREATMENT   Patient Name: Gerald Bauer MRN: [id number] DOB:01-02-1946, 76 y.o., male Today's Date: 12/11/2021  PCP: [name], PA-C REFERRING PROVIDER: Reesa Chew, PA-C   End of Session - 12/11/21 1121     Visit Number 8    Number of Visits 25    Date for SLP Re-Evaluation 02/04/22    SLP Start Time 0900    SLP Stop Time  1000    SLP Time Calculation (min) 60 min    Activity Tolerance Patient tolerated treatment well             Past Medical History:  Diagnosis Date   Acid reflux    Arthritis    hands   Carotid atherosclerosis    Diabetes mellitus without complication ([place])    Ganglion cyst 02/23/2015   Hyperlipemia    Hypertension    Joint pain in fingers of right hand 02/23/2015   Dominant hand   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 07/11/2015   Prostate cancer ([place])    history   Stroke (cerebrum) ([place]) 10/06/2021   Tachycardia    Tinea pedis of both feet 01/10/2017   Past Surgical History:  Procedure Laterality Date   [place]  2013   PROSTATECTOMY  2012   Patient Active Problem List   Diagnosis Date Noted   Hemiparesis, aphasia, and dysphagia as late effects of stroke ([place]) 11/07/2021   Lower urinary tract symptoms (LUTS) 11/07/2021   Myalgia due to statin 02/21/2021   Type 2 diabetes mellitus without complication, without long-term current use of insulin ([place]) 02/21/2021   Aortic atherosclerosis ([place]) 04/27/2020   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   Onychomycosis of multiple toenails with type 2 diabetes mellitus ([place]) 07/19/2016   Carotid stenosis 03/13/2016   Hyperlipidemia associated with type 2 diabetes mellitus ([place])    Hypertension associated with type 2 diabetes mellitus ([place])    Personal history of prostate cancer    Carotid atherosclerosis    GERD (gastroesophageal reflux disease) 08/20/2013    ONSET DATE:  10/05/2021   REFERRING DIAG: Cerebral infarction due to unspecified occlusion of left middle cerebral artery  THERAPY DIAG:  Verbal apraxia  Aphasia  Rationale for Evaluation and Treatment Rehabilitation  SUBJECTIVE:   SUBJECTIVE STATEMENT:  Pt disappointed he was recommended not to drive per neuro.  Pt accompanied by: family member son [name] present for session today  PERTINENT HISTORY: Pt  is a 76 year old male with history of left-sided carotid stenosis, non-insulin-dependent diabetes mellitus type 2, PAD, GERD, mild COPD, history of tobacco use, hyperlipidemia, hypertension, who presented 10/05/21 [facility] ED with facial droop, expressive aphasia, and R sided weakness and found to have left MCA CVA. He was admitted to [facility] 7/17-10/31/21. Advanced to dysphagia 3/thin liquids by cup by time of d/c from CIR. 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 Difficult to fully assess cognition given severity of linguistic impairment; however, does present with decreased safety awareness, diminished problem-solving, emergent awareness, and mild impulsivity with movement."   DIAGNOSTIC FINDINGS: MBS 10/19/21: "Pt presents with overall mild oropharyngeal dysphagia. Oral phase c/b piecemeal deglutition, decreased mastication, decreased bolus cohesion, premature spillage, and weak lingual manipulation. Pharyngeal phase c/b reduced pharyngeal peristalsis + reduced BOT approximation to PPW, which results in mild to moderate vallecular and pyriform sinuses residuals (vallecula >  pyriform sinuses for solids, pyriform sinuses > vallecula for liquids). No penetration nor aspiration appreciated with thin liquid via cup, Dysphagia 1 (puree), Dysphagia 2 (chopped), Dysphagia 3 (mechanical soft), or with 13 mm Barium tablet placed in puree. Once instance of sensed aspiration with intake of large bolus of thin liquid via  straw due to swallow initiation delay to pyriform sinuses and mistiming of swallow-breath cycle; despite strong reflexive cough aspirant was not fully cleared from trachea. Pt's level of swallow initiation was noted to vary with liquid consumption (over base of epiglottis and at pyriform sinuses) with level of swallow initiation for solids to be consistent at the vallecula. Pharyngeal stasis was partially cleared with reflexive secondary swallow." MRI brain 10/05/21: 1. 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 No associated hemorrhage or significant regional mass effect. 2. Loss of normal flow voids throughout the left ICA and MCA, consistent with slow flow and/or occlusion. 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 3. Underlying mild chronic microvascular ischemic disease. Tiny remote left ACA distribution infarct.   PAIN:  Are you having pain? No Pain location: left shoulder   PATIENT GOALS talk better  OBJECTIVE:   TODAY'S TREATMENT:  SLP reinforced neuro recommendation not to drive at this time due to impulsivity, and encouraged discussion with OT re: physical capabilities. Education provided on OT driving school/evaluations in the area, encouraged to consider this in the future when further along with rehab/recovery. Patient reported not using keyword sheet/photo for practice at home. SLP educated pt and son on importance of repetition and using personally relevant words during home practice. Worked with pt and son to identify other possible "keyword sheets" for pt to discuss topics of interest (cars, woodworking). Pt generated items/terminology with occasional mod cues (woodworking tools, car parts). Approximations were intelligible (with context) to SLP ~50% of the time; pt required occasional mod cues to use multimodal strategies (additional word for context, spelling/first letter cues with alphabet board,  drawing) when breakdowns occurred.     PATIENT EDUCATION: Education details: see today's treatment for details Person educated: Patient Education method: Explanation, Demonstration, and written supports Education comprehension: verbalized understanding and needs further education   GOALS: Goals reviewed with patient? Yes  SHORT TERM GOALS: Target date: 10 sessions  Patient will participate in clinical assessment of swallow function with goals added as needed. Baseline: Goal status: MET  2.  Pt will communicate emergency information 100% accuracy using visual aid if necessary.  Baseline:  Goal status: INITIAL  3.  Pt will approximate personally relevant words and phrases >80% accuracy using script training and min visual cues for apraxia.  Baseline:  Goal status: INITIAL  4.  Pt will generate at least 4 descriptors of target word 80% of the time using semantic feature analysis to improve abilities in wordfinding and resolving communication breakdowns.  Baseline:  Goal status: INITIAL  5.  Pt will complete HEP for dysphagia with rare min A. Baseline:  Goal status: INITIAL    LONG TERM GOALS: Target date: 02/04/2022  Pt will engage in 5-8 minutes simple-mod complex conversation re: topic of interest with supported conversation, aphasia compensations.  Baseline:  Goal status: INITIAL  2.  Pt will ID and attempt repair of communication breakdowns >90% of the time in session.   Baseline:  Goal status: INITIAL  3.  Pt/family will demonstrate knowledge of community resources and activities to support language/communication. Baseline:  Goal status: INITIAL  4.  Pt will demonstrate  use of swallowing precautions independently with s/sx aspiration <5% of trials. 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 Pt continues to supplement verbal communication with alphabet board during breakdowns, although extended time and mod cues required. With moderate cues and imitation, pt able to name personally relevant objects (1-2 syllables) using a carrier phrase 80% acc. Patient demonstrates good use of nonverbals and Y/N responses, however is significantly limited in his ability to relay thoughts, feelings, and non-contextual or abstract information. I recommend skilled ST to improve pt's swallowing, language and motor speech skills to increase swallowing safety, improve communication and reduce frustration.   OBJECTIVE IMPAIRMENTS include expressive language, receptive language, aphasia, apraxia, dysarthria, and dysphagia. These impairments are limiting patient from managing medications, managing appointments, managing finances, household responsibilities, ADLs/IADLs, effectively communicating at home and in community, and safety when swallowing. Factors affecting potential to achieve goals and functional outcome are cooperation/participation level; pt appears highly motivated with good family support. Patient will benefit from skilled SLP services to address above impairments and improve overall function.  REHAB POTENTIAL: Excellent  PLAN: SLP FREQUENCY: 2x/week  SLP DURATION: 12 weeks  PLANNED INTERVENTIONS: Aspiration precaution training, Pharyngeal strengthening exercises, Diet toleration management , Language facilitation, Environmental controls, Trials of upgraded texture/liquids, Cognitive reorganization, Internal/external aids, Functional tasks, Multimodal communication approach, SLP instruction and feedback, Compensatory strategies, and Patient/family education    [name], MS, Actor [phone]

## 2021-12-11 NOTE — Therapy (Signed)
OUTPATIENT PHYSICAL THERAPY NEURO TREATMENT   Patient Name: Gerald Bauer MRN: [id number] DOB:April 17, 1945, 76 y.o., male Today's Date: 12/11/2021   PCP: [name], PA-C REFERRING PROVIDER: Bary Leriche, PA-C   PT End of Session - 12/11/21 1009     Visit Number 4    Number of Visits 24    Date for PT Re-Evaluation 02/16/22    PT Start Time 1010    PT Stop Time 1055    PT Time Calculation (min) 45 min    Equipment Utilized During Treatment Gait belt    Activity Tolerance Patient tolerated treatment well    Behavior During Therapy WFL for tasks assessed/performed               Past Medical History:  Diagnosis Date   Acid reflux    Arthritis    hands   Carotid atherosclerosis    Diabetes mellitus without complication ([place])    Ganglion cyst 02/23/2015   Hyperlipemia    Hypertension    Joint pain in fingers of right hand 02/23/2015   Dominant hand   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 07/11/2015   Prostate cancer ([place])    history   Stroke (cerebrum) ([place]) 10/06/2021   Tachycardia    Tinea pedis of both feet 01/10/2017   Past Surgical History:  Procedure Laterality Date   Conception  2013   PROSTATECTOMY  2012   Patient Active Problem List   Diagnosis Date Noted   Hemiparesis, aphasia, and dysphagia as late effects of stroke ([place]) 11/07/2021   Lower urinary tract symptoms (LUTS) 11/07/2021   Myalgia due to statin 02/21/2021   Type 2 diabetes mellitus without complication, without long-term current use of insulin ([place]) 02/21/2021   Aortic atherosclerosis ([place]) 04/27/2020   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   Onychomycosis of multiple toenails with type 2 diabetes mellitus ([place]) 07/19/2016   Carotid stenosis 03/13/2016   Hyperlipidemia associated with type 2 diabetes mellitus ([place])    Hypertension associated with type 2 diabetes mellitus ([place])    Personal history of  prostate cancer    Carotid atherosclerosis    GERD (gastroesophageal reflux disease) 08/20/2013    ONSET DATE: 10/05/21  REFERRING DIAG: X10.626 (ICD-10-CM) - Cerebral infarction due to unspecified occlusion or stenosis of left middle cerebral artery  THERAPY DIAG:  Difficulty in walking, not elsewhere classified  Muscle weakness (generalized)  Other lack of coordination  Unsteadiness on feet  Abnormality of gait and mobility  Rationale for Evaluation and Treatment Rehabilitation  SUBJECTIVE:  SUBJECTIVE STATEMENT:  Pt continues with wearing brace on the R wrist.  Pt continues to support his B shoulder hurting significantly due to use of the RW and not using his LE's as much.  Pt and son support that pt has not had any falls or LOB since the last visit.  Pt accompanied by: self and son  PAIN today:  Are you having pain? Yes: NPRS scale: 8/10 Pain location: R wrist, B shoulders  PERTINENT HISTORY:Per H and P from 7/17 hospital d/c Pt  is a 76 year old male with history of left-sided carotid stenosis, non-insulin-dependent diabetes mellitus type 2, PAD, GERD, mild COPD, history of tobacco use, hyperlipidemia, hypertension, who presents emergency department for chief concerns of facial droop, expressive aphasia, and R sided weakness  PAIN:  Are you having pain? Yes: NPRS scale: 6/10 Pain location: B shoulders  PRECAUTIONS: Fall  WEIGHT BEARING RESTRICTIONS No  FALLS: Has patient fallen in last 6 months? Yes. Number of falls 1  LIVING ENVIRONMENT: Lives with: lives with their family and lived alone prior to onset  Lives in: House/apartment Stairs: Yes: External: 4 steps; can reach both Has following equipment at home: Walker - 2 wheeled and shower chair  PLOF: Independent  PATIENT  GOALS become independent with everyday activities like he was prior to the onset of the stroke   OBJECTIVE:   DIAGNOSTIC FINDINGS: 1. 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 No associated hemorrhage or significant regional mass effect.  2. Loss of normal flow voids throughout the left ICA and MCA,  consistent with slow flow and/or occlusion. Susceptibility artifact  within proximal left MCA branches consistent with intraluminal  thrombus.  3. Underlying mild chronic microvascular ischemic disease. Tiny  remote left ACA distribution infarct.     MRA HEAD IMPRESSION:   "1. Interval near complete occlusion of the left ICA and MCA,  presumably acute in nature given the presence of the acute left MCA  territory infarct. Left A1 segment not well seen either, also likely  occluded.  2. Otherwise wide patency of the major intracranial arterial  circulation. No other hemodynamically significant or correctable  stenosis.     MRA NECK IMPRESSION:  1. 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 Some attenuated flow seen  distally within the cervical left ICA, with subsequent reocclusion  by the skull base.  2. 50% atheromatous stenosis at the origin of the cervical right  ICA. Otherwise wide patency of the right carotid artery system.  3. Wide patency of the vertebral arteries within the neck.".    COGNITION: Overall cognitive status: Within functional limits for tasks assessed and subjective history limited secondary to aphasia but able to answer 1 word questions appropriately.    SENSATION: WFL  COORDINATION: WNL with heel to shin testing    LOWER EXTREMITY ROM:   AROM WNL    LOWER EXTREMITY MMT:    MMT Right Eval Left Eval  Hip flexion 4 4+  Hip extension    Hip abduction 4 5  Hip adduction 4 5  Hip internal rotation    Hip external rotation    Knee flexion 4 5  Knee extension 4 5  Ankle dorsiflexion 4+  5  Ankle plantarflexion 4+ 5  Ankle inversion 5 5  Ankle eversion 5 5  (Blank rows = not tested)   TRANSFERS: Assistive device utilized: Environmental consultant - 2 wheeled  Sit to stand:  Modified independence Stand to sit: Complete Independence Chair to chair: SBA  STAIRS:  Level of Assistance: SBA Stair Negotiation Technique: Step to Pattern with Bilateral Rails Number of Stairs: 4  Height of Stairs: 6in  Comments: Right lower extremity foot catches when a sending steps and patient performs descending steps utilizing right lower extremity for more support.  Patient reports he knows he is doing this "the hard way"  GAIT: Gait pattern: decreased arm swing- Right and decreased step length- Right Distance walked: 10 M Assistive device utilized: Environmental consultant - 2 wheeled and None Level of assistance: Modified independence Comments: difficulty with turns and object navigation per DGI tesitng, able to walk without AD but only in controlled environment without obstacles or significant challenge  FUNCTIONAL TESTs:  5 times sit to stand: 16.95 sec no UE assist 10 meter walk test: .76 m/s without AD  Berg Balance Scale: 44/56 DGI:10  PATIENT SURVEYS:  FOTO to retest next session, likely with either asking patient questions or having caregiver complete patient completed but had 97% indicating he could run and jump with these and when questioned about this following patient reports he cannot run or jump with these and would benefit from filling this out again next time.  12/04/2021: 49 (goal 62)     TODAY'S TREATMENT:   [place] hurdle obstacle/foot clearance FWD/BCKWD - x multiple reps with full step over BLE and RLE only. Still knocks into hurdle multiple times with the R LE  Standing heel raise 20x B Single leg heel raise 15x each LE. Difficult on R Standing DF - 20x Difficult/decreased AROM B  STS with BLE on airex x10; then x5 with staggered stance each LE leading, pt performing well with task and  limited compensation  Seated R DF only 10x 10 sec holds. Challenging   On Airex, at support surface: NBOS EC 4x30 sec. Improved. CGA only Tandem stance 2x30 sec each LE Slow marches 2x20 alt LE pt with increased difficulty performing with slowed technique NBOS EO with perturbations, 30 sec bouts Tandem stance with perturbations, 30 sec bouts  Cable column walkouts, forward/lateral/backward, 12.5#, x5 each direction        PATIENT EDUCATION: Education details: Pt educated throughout session about proper posture and technique with exercises. Improved exercise technique, movement at target joints, use of target muscles after min to mod verbal, visual, tactile cues.   Person educated: Patient Education method: Explanation, Demonstration, Tactile cues, and Verbal cues Education comprehension: verbalized understanding, returned demonstration, verbal cues required, tactile cues required, and needs further education   HOME EXERCISE PROGRAM:  No updates today, pt to continue as previously given  9/11: Pt to continue HEP as previously given. However, PT did provide education to use a chair with arm-rests for lateral support when performing STS   Access Code: [email or web address] URL: [email or web address] Date: 11/24/2021 Prepared by: Rivka Barbara  Exercises - Side Stepping with Counter Support  - 1 x daily - 7 x weekly - 2 sets - 10 reps - Standing Tandem Balance with Counter Support  - 1 x daily - 7 x weekly - 3 sets - 30 second hold - Standing March with Counter Support  - 1 x daily - 7 x weekly - 3 sets - 10 reps - Sit to Stand Without Arm Support  - 1 x daily - 7 x weekly - 2 sets - 10 reps    GOALS: Goals reviewed with patient? Yes  SHORT TERM GOALS: Target date: 12/22/2021   Patient will be  independent in home exercise program to improve strength/mobility for better functional independence with ADLs. Baseline: No HEP currently  Goal status: INITIAL    LONG  TERM GOALS: Target date: 02/16/2022   1.  Patient (> 69 years old) will complete five times sit to stand test in < 15 seconds indicating an increased LE strength and improved balance. Baseline: 16.95 Goal status: INITIAL  2.  Patient will increase FOTO score to equal to or greater than  60   to demonstrate statistically significant improvement in mobility and quality of life.  Baseline: Test neck session Goal status: INITIAL   3.  Patient will increase Berg Balance score by > 6 points to demonstrate decreased fall risk during functional activities. Baseline: 44 Goal status: INITIAL   4.   Patient will improve DGI by 5 points or more to reduce fall risk and demonstrate improved gait ability. Baseline: 10 Goal status: INITIAL  5.   Patient will increase 10 meter walk test to >1.42ms as to improve gait speed for better community ambulation and to reduce fall risk. Baseline: .786m Goal status: INITIAL   ASSESSMENT:  CLINICAL IMPRESSION:  Pt performed well with good effort throughout the session.  Pt noted to have decreased strength in the R LE with decreased proprioception as well.  Pt still limited with communication, but able to communicate when things are difficult/challenging or if its causing him any issue.  Pt did have one instance of imbalance that had to be corrected by therapist during cable column walkouts and it occurred when pt was at the end of the rep and no weight was pulling.  Pt stabilized himself and then continued to perform rest of bouts.   Pt will continue to benefit from skilled therapy to address remaining deficits in order to improve overall QoL and return to PLOF.     OBJECTIVE IMPAIRMENTS Abnormal gait, cardiopulmonary status limiting activity, decreased activity tolerance, decreased balance, decreased endurance, decreased mobility, difficulty walking, decreased strength, and decreased safety awareness.   ACTIVITY LIMITATIONS lifting, bending, standing,  squatting, stairs, transfers, and locomotion level  PARTICIPATION LIMITATIONS: meal prep, shopping, and community activity  PERSONAL FACTORS Age and 3+ comorbidities: left-sided carotid stenosis, non-insulin-dependent diabetes mellitus type 2, PAD, GERD, mild COPD, history of tobacco use, hyperlipidemia, hypertension  are also affecting patient's functional outcome.   REHAB POTENTIAL: Good  CLINICAL DECISION MAKING: Evolving/moderate complexity  EVALUATION COMPLEXITY: Moderate  PLAN: PT FREQUENCY: 2x/week  PT DURATION: 12 weeks  PLANNED INTERVENTIONS: Therapeutic exercises, Therapeutic activity, Neuromuscular re-education, Balance training, Gait training, Patient/Family education, Self Care, Joint mobilization, Stair training, Moist heat, and Manual therapy  PLAN FOR NEXT SESSION: begin POC, focus on R LE NMR to improve proprioception and coordination, obstacle clearance, dual task, continue plan JoChristie NottinghamPT 12/11/2021, 12:50 PM

## 2021-12-11 NOTE — Patient Instructions (Signed)
Local Driver Evaluation Programs: ° °Comprehensive Evaluation: includes clinical and in vehicle behind the wheel testing by OCCUPATIONAL THERAPIST. Programs have varying levels of adaptive controls available for trial.  ° °[facility] °[address] °[place], [place]  [address] °[phone] or [phone] °http://www.driver-rehab.com °Evaluator:  Cyndee Crompton, OT/CDRS/CDI/SCDCM/Low Vision Certification ° °[facility] °[address] °[place], [place] [address] °[phone] °[email or web address]://[email or web address].[email or web address]/home/services/rehabilitation.aspx °Evaluators:  Shannon Sheek, OT and Jill Tucker, OT ° °[facility] [place] [facility] (ONLY SERVES VETERANS!!) °[facility] °[address] °[place], [place]  [address] °[phone] °http://www.[place].gov/services/Physical_Medicine_Rehabilitation_Services.asp °Evaluators:  Eric Andrews, KT; Heidi Harris, KT;  Gary Whitaker, KT (KT=kiniesotherapist) ° ° °Clinical evaluations only:  Includes clinical testing, refers to other programs or local certified driving instructor for behind the wheel testing. ° °[facility] (outpatient Rehab) °[facility] °[address] °[place], [place] [address] °[phone] for scheduling °[email or web address] °Evaluators:  Kelly Lambeth, OT; Kate Phillips, OT ° °Other area clinical evaluators available upon request including [facility], [facility] and [facility]. ° ° °    Resource List °What is a Driver Evaluation: °Your Road Ahead - A Guide to Comprehensive Driving Evaluations °http://www.thehartford.com/resources/mature-market-excellence/publications-on-aging ° °Association for [employer] Services - Disability and Driving Fact Sheets °http://www.aded.net/?page=510 ° °Driving after a Brain  Injury: °[employer] °http://www.biausa.org/tbims-abstracts/if-there-is-an-effective-way-to-determine-if-someone-is-ready-to-drive-after-tbi?A=SearchResult&SearchID=9495675&ObjectID=2758842&ObjectType=35 ° °Driving with Adaptive Equipment: °[facility] Process °http://www.driver-rehab.com/adaptive-equipment ° °[employer] Mob[employer] °http://www.nmeda.com/ ° ° ° ° ° ° °  °

## 2021-12-14 ENCOUNTER — Ambulatory Visit: Payer: Medicare Other | Admitting: Occupational Therapy

## 2021-12-14 ENCOUNTER — Ambulatory Visit: Payer: Medicare Other

## 2021-12-14 ENCOUNTER — Ambulatory Visit: Payer: Medicare Other | Admitting: Speech Pathology

## 2021-12-14 ENCOUNTER — Encounter: Payer: Self-pay | Admitting: Occupational Therapy

## 2021-12-14 DIAGNOSIS — R482 Apraxia: Secondary | ICD-10-CM

## 2021-12-14 DIAGNOSIS — M6281 Muscle weakness (generalized): Secondary | ICD-10-CM

## 2021-12-14 DIAGNOSIS — I63512 Cerebral infarction due to unspecified occlusion or stenosis of left middle cerebral artery: Secondary | ICD-10-CM

## 2021-12-14 DIAGNOSIS — R278 Other lack of coordination: Secondary | ICD-10-CM

## 2021-12-14 DIAGNOSIS — R269 Unspecified abnormalities of gait and mobility: Secondary | ICD-10-CM | POA: Diagnosis not present

## 2021-12-14 DIAGNOSIS — R4701 Aphasia: Secondary | ICD-10-CM

## 2021-12-14 DIAGNOSIS — R2681 Unsteadiness on feet: Secondary | ICD-10-CM

## 2021-12-14 NOTE — Therapy (Signed)
OUTPATIENT SPEECH LANGUAGE PATHOLOGY TREATMENT   Patient Name: Gerald Bauer MRN: [id number] DOB:1945/07/30, 76 y.o., male Today's Date: 12/14/2021  PCP: [name], PA-C REFERRING PROVIDER: Reesa Chew, PA-C   End of Session - 12/14/21 1615     Visit Number 9    Number of Visits 25    Date for SLP Re-Evaluation 02/04/22    SLP Start Time 1500    SLP Stop Time  1600    SLP Time Calculation (min) 60 min    Activity Tolerance Patient tolerated treatment well             Past Medical History:  Diagnosis Date   Acid reflux    Arthritis    hands   Carotid atherosclerosis    Diabetes mellitus without complication ([place])    Ganglion cyst 02/23/2015   Hyperlipemia    Hypertension    Joint pain in fingers of right hand 02/23/2015   Dominant hand   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 07/11/2015   Prostate cancer ([place])    history   Stroke (cerebrum) ([place]) 10/06/2021   Tachycardia    Tinea pedis of both feet 01/10/2017   Past Surgical History:  Procedure Laterality Date   Grandfather  2013   PROSTATECTOMY  2012   Patient Active Problem List   Diagnosis Date Noted   Hemiparesis, aphasia, and dysphagia as late effects of stroke ([place]) 11/07/2021   Lower urinary tract symptoms (LUTS) 11/07/2021   Myalgia due to statin 02/21/2021   Type 2 diabetes mellitus without complication, without long-term current use of insulin ([place]) 02/21/2021   Aortic atherosclerosis ([place]) 04/27/2020   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   Onychomycosis of multiple toenails with type 2 diabetes mellitus ([place]) 07/19/2016   Carotid stenosis 03/13/2016   Hyperlipidemia associated with type 2 diabetes mellitus ([place])    Hypertension associated with type 2 diabetes mellitus ([place])    Personal history of prostate cancer    Carotid atherosclerosis    GERD (gastroesophageal reflux disease) 08/20/2013    ONSET DATE:  10/05/2021   REFERRING DIAG: Cerebral infarction due to unspecified occlusion of left middle cerebral artery  THERAPY DIAG:  Verbal apraxia  Aphasia  Rationale for Evaluation and Treatment Rehabilitation  SUBJECTIVE:   SUBJECTIVE STATEMENT:  "Good aft-ter-noon."  Pt accompanied by: family member daughter in Physicist, medical present for session today  PERTINENT HISTORY: Pt  is a 76 year old male with history of left-sided carotid stenosis, non-insulin-dependent diabetes mellitus type 2, PAD, GERD, mild COPD, history of tobacco use, hyperlipidemia, hypertension, who presented 10/05/21 to [facility] ED with facial droop, expressive aphasia, and R sided weakness and found to have left MCA CVA. He was admitted to [facility] 7/17-10/31/21. Advanced to dysphagia 3/thin liquids by cup by time of d/c from CIR. 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 Difficult to fully assess cognition given severity of linguistic impairment; however, does present with decreased safety awareness, diminished problem-solving, emergent awareness, and mild impulsivity with movement."   DIAGNOSTIC FINDINGS: MBS 10/19/21: "Pt presents with overall mild oropharyngeal dysphagia. Oral phase c/b piecemeal deglutition, decreased mastication, decreased bolus cohesion, premature spillage, and weak lingual manipulation. Pharyngeal phase c/b reduced pharyngeal peristalsis + reduced BOT approximation to PPW, which results in mild to moderate vallecular and pyriform sinuses residuals (vallecula > pyriform sinuses for solids, pyriform sinuses >  vallecula for liquids). No penetration nor aspiration appreciated with thin liquid via cup, Dysphagia 1 (puree), Dysphagia 2 (chopped), Dysphagia 3 (mechanical soft), or with 13 mm Barium tablet placed in puree. Once instance of sensed aspiration with intake of large bolus of thin liquid via straw due to swallow  initiation delay to pyriform sinuses and mistiming of swallow-breath cycle; despite strong reflexive cough aspirant was not fully cleared from trachea. Pt's level of swallow initiation was noted to vary with liquid consumption (over base of epiglottis and at pyriform sinuses) with level of swallow initiation for solids to be consistent at the vallecula. Pharyngeal stasis was partially cleared with reflexive secondary swallow." MRI brain 10/05/21: 1. 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 No associated hemorrhage or significant regional mass effect. 2. Loss of normal flow voids throughout the left ICA and MCA, consistent with slow flow and/or occlusion. 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 3. Underlying mild chronic microvascular ischemic disease. Tiny remote left ACA distribution infarct.   PAIN:  Are you having pain? No Pain location: left shoulder   PATIENT GOALS talk better  OBJECTIVE:   TODAY'S TREATMENT:  Patient brought sheets from home with attempts to write additional words on his woodworking page. SLP used supportive conversation and multimodal strategies to clarify items pt listed. Pt able to approximate spelling for some items (roter/rotary, bech/beach house, eg). Combined with speech, cues for description, and drawing, pt able to share details about projects he made and tools he used. Demo'd cuing for apraxia to daughter in law. Pt read single words from his personal lists 60% acc, improves to 85% with model.     PATIENT EDUCATION: Education details: see today's treatment for details Person educated: Patient Education method: Explanation, Demonstration, and written supports Education comprehension: verbalized understanding and needs further education   GOALS: Goals reviewed with patient? Yes  SHORT TERM GOALS: Target date: 10 sessions  Patient will participate in clinical assessment of  swallow function with goals added as needed. Baseline: Goal status: MET  2.  Pt will communicate emergency information 100% accuracy using visual aid if necessary.  Baseline:  Goal status: INITIAL  3.  Pt will approximate personally relevant words and phrases >80% accuracy using script training and min visual cues for apraxia.  Baseline:  Goal status: INITIAL  4.  Pt will generate at least 4 descriptors of target word 80% of the time using semantic feature analysis to improve abilities in wordfinding and resolving communication breakdowns.  Baseline:  Goal status: INITIAL  5.  Pt will complete HEP for dysphagia with rare min A. Baseline:  Goal status: INITIAL    LONG TERM GOALS: Target date: 02/04/2022  Pt will engage in 5-8 minutes simple-mod complex conversation re: topic of interest with supported conversation, aphasia compensations.  Baseline:  Goal status: INITIAL  2.  Pt will ID and attempt repair of communication breakdowns >90% of the time in session.   Baseline:  Goal status: INITIAL  3.  Pt/family will demonstrate knowledge of community resources and activities to support language/communication. Baseline:  Goal status: INITIAL  4.  Pt will demonstrate use of swallowing precautions independently with s/sx aspiration <5% of trials. 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 Pt continues to supplement verbal communication with  alphabet board during breakdowns, although extended time and mod cues required. With moderate cues and imitation, pt able to name personally relevant objects (1-2 syllables) using a carrier phrase 80% acc. Patient demonstrates good use of nonverbals and Y/N responses, however is  significantly limited in his ability to relay thoughts, feelings, and non-contextual or abstract information. I recommend skilled ST to improve pt's swallowing, language and motor speech skills to increase swallowing safety, improve communication and reduce frustration.   OBJECTIVE IMPAIRMENTS include expressive language, receptive language, aphasia, apraxia, dysarthria, and dysphagia. These impairments are limiting patient from managing medications, managing appointments, managing finances, household responsibilities, ADLs/IADLs, effectively communicating at home and in community, and safety when swallowing. Factors affecting potential to achieve goals and functional outcome are cooperation/participation level; pt appears highly motivated with good family support. Patient will benefit from skilled SLP services to address above impairments and improve overall function.  REHAB POTENTIAL: Excellent  PLAN: SLP FREQUENCY: 2x/week  SLP DURATION: 12 weeks  PLANNED INTERVENTIONS: Aspiration precaution training, Pharyngeal strengthening exercises, Diet toleration management , Language facilitation, Environmental controls, Trials of upgraded texture/liquids, Cognitive reorganization, Internal/external aids, Functional tasks, Multimodal communication approach, SLP instruction and feedback, Compensatory strategies, and Patient/family education    [name], MS, Actor [phone]

## 2021-12-14 NOTE — Therapy (Signed)
OUTPATIENT OCCUPATIONAL THERAPY PROGRESS NOTE AND NEURO TREATMENT Reporting period 11/06/21-12/11/21  Patient Name: Gerald Bauer MRN: [id number] DOB:April 12, 1945, 76 y.o., male Today's Date: 12/14/2021  PCP: Threasa Alpha, PA REFERRING PROVIDER: Reesa Chew   OT End of Session - 12/14/21 1558     Visit Number 11    Number of Visits 24    Date for OT Re-Evaluation 01/29/22    Authorization Time Period Reporting period beginning 11/06/21    OT Start Time 1645    OT Stop Time 9390    OT Time Calculation (min) 45 min    Activity Tolerance Patient tolerated treatment well    Behavior During Therapy [facility] for tasks assessed/performed            Past Medical History:  Diagnosis Date   Acid reflux    Arthritis    hands   Carotid atherosclerosis    Diabetes mellitus without complication ([place])    Ganglion cyst 02/23/2015   Hyperlipemia    Hypertension    Joint pain in fingers of right hand 02/23/2015   Dominant hand   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 07/11/2015   Prostate cancer ([place])    history   Stroke (cerebrum) ([place]) 10/06/2021   Tachycardia    Tinea pedis of both feet 01/10/2017   Past Surgical History:  Procedure Laterality Date   [place]  2013   PROSTATECTOMY  2012   Patient Active Problem List   Diagnosis Date Noted   Hemiparesis, aphasia, and dysphagia as late effects of stroke ([place]) 11/07/2021   Lower urinary tract symptoms (LUTS) 11/07/2021   Myalgia due to statin 02/21/2021   Type 2 diabetes mellitus without complication, without long-term current use of insulin ([place]) 02/21/2021   Aortic atherosclerosis ([place]) 04/27/2020   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   Onychomycosis of multiple toenails with type 2 diabetes mellitus ([place]) 07/19/2016   Carotid stenosis 03/13/2016   Hyperlipidemia associated with type 2 diabetes mellitus ([place])    Hypertension associated with type 2  diabetes mellitus ([place])    Personal history of prostate cancer    Carotid atherosclerosis    GERD (gastroesophageal reflux disease) 08/20/2013    ONSET DATE: 10/05/21  REFERRING DIAG: L MCA CVA  THERAPY DIAG:  Muscle weakness (generalized)  Other lack of coordination  Left middle cerebral artery stroke ([place])  Rationale for Evaluation and Treatment Rehabilitation  SUBJECTIVE:    SUBJECTIVE STATEMENT: Son present with pt today.  Pt reporting a lot of pain in wrist and shoulder.  Son states that he's not exercising his arm much at home but that he constantly sees pt try to use it with daily tasks.     PERTINENT HISTORY: Per chart, Gerald Bauer is a 75 year old right-handed male with history of hypertension, hyperlipidemia, diabetes mellitus, prostate cancer/prostatectomy 2012, quit smoking 24 years ago.  Presented to [facility]/05/2021 with right side weakness/facial droop and expressive aphasia.  Blood pressure 155/102.  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  No associated hemorrhage or significant mass effect.    PAIN:  Are you having pain? Yes: Pain location:  R wrist 8/10, R shoulder 5/10 pain Pain description: unable to verbalize Aggravating factors: movement Relieving factors: use of wrist brace; heat  FALLS: Has patient fallen in last 6 months? Yes. Number of falls 3 since CVA  PLOF: Independent/retired Dealer  PATIENT GOALS :  Pt points to his hand (wants to be able to use his hand)  OBJECTIVE:   HAND DOMINANCE: Right   FUNCTIONAL OUTCOME MEASURES: FOTO: 48  UPPER EXTREMITY ROM     Active ROM Right eval Left Eval WNL Right 12/11/21  Shoulder flexion 90 (135)  95 (110)  Shoulder abduction 95 (110)  80 (90)  Wrist flex   54  Wrist ext   41   UPPER EXTREMITY MMT:     MMT Right eval Left Eval 5/5 Right 12/11/21  Shoulder flexion 3-  -3  Shoulder abduction 3-  -3  Shoulder adduction     Shoulder extension      Shoulder internal rotation 3-    Shoulder external rotation 3-    Middle trapezius     Lower trapezius     Elbow flexion 4-  4  Elbow extension 4+  5  Wrist flexion 4  NT d/t pain  Wrist extension 4-  NT d/t pain  (Blank rows = not tested)  HAND FUNCTION: Eval:        Grip strength: Right: 14 lbs; Left: 58 lbs, Lateral pinch: Right: 7 lbs, Left: 19 lbs, and 3 point pinch: Right: 2 lbs, Left: 16 lbs 10th visit: Grip strength: Right: 16  lbs;             Lateral pinch: Right: 9  lbs;             3 point pinch: Right: 5 lbs  Eval: COORDINATION: Finger Nose Finger test: difficult/lacks precision with reaching targets 9 Hole Peg test: Right: unable sec; Left: 27 sec Able to oppose 2nd and 3rd digits to thumb on R hand  10th visit 9 hole Peg test: Right: 11 min, 44 sec     TODAY'S TREATMENT:   Therapeutic Exercise:  Pt completed 1.5lb digi-flex for finger strengthening, cues for digit isolation. Pt performed gross gripping with grip strengthener. Pt worked on sustaining grip while grasping pegs and reaching at various heights. Gripper was placed in the 2nd resistive slot with the white resistive spring (11.2#), achieved green and orange pegs however unable to achieve shorter yellow/blue pegs. Pt. Performed hand strengthening with soft light blue theraputty. Pt. worked on gross grip loop, lateral pinch, 3pt. pinch, gross digit extension, digit extension table spread, digit abduction loop, single digit extension loop, thumb opposition, and lumbical ex,  Pt. required verbal and tactile cues for proper technique. Updated HEP (reports tan too easy, given light blue) - handout provided.    Therapeutic Activity  Pt focused on improving [facility] with manipulating nuts and bolts positioned vertically. Pt. Was able to unscrew both the 1" and 1/2" nuts. Significantly  required increased time and cues for hand control during the task. Patient completed buttoning/unbuttoning task on tabletop and with  practice shirt donned.    PATIENT EDUCATION: Education details: HEP progression Person educated: pt Education method: Explanation and Verbal cues, handout Education comprehension: verbalized understanding, returned demo   HOME EXERCISE PROGRAM: Theraputty, table slides, self passive stretching for the R wrist and digit flex/ext  GOALS: Goals reviewed with patient? Yes  SHORT TERM GOALS: Target date: 12/18/21  Pt will be indep to perform HEP for increasing strength and coordination throughout RUE.  Baseline: Not yet initiated; 10th: putty given but pt reports limited use, instructed in table slides and self PROM for R wrist/digits.   Goal status: ongoing  2.  Pt will manage clothing fasteners with extra time, using RUE as an assist  Baseline: unable; pt has elastic laces and wears elastic waisted pants; 10th: pt uses R hand as an assist to zip and button jeans with extra time; not yet tried small buttons on a shirt.  Pt continues to use elastic laces on shoes. Goal status: ongoing  LONG TERM GOALS: Target date: 01/29/22  Pt will increase FOTO score to 55 or better to indicate improved performance with daily tasks.  Baseline: 48; 10th: 48 Goal status: ongoing  2.  Pt will increase R grip strength by 10 or more lbs to enable pt to hold and carry light ADL supplies without dropping.  Baseline: R grip 14#, L 58#; 10th visit: R grip 16 Goal status: INITIAL  3.  Pt will increase RUE strength to be able to engage RUE into ADLs at least 50% of the time. Baseline: Pt using L non-dominant arm to manage ADLs; 10th visit: Son reports he constantly sees pt attempt to use his R arm for daily tasks, but continues to be limited and still must use LUE for most tasks (see chart for RUE MMT) Goal status: ongoing  4.  Pt will complete 9 hole peg test on the R in 3 min or less to work towards ability to use R hand to pick up small ADL supplies.  Baseline: Pt can remove a peg but can not pick one up;  10th visit: R 9 hole completion 11 min 44 sec Goal status: ongoing   ASSESSMENT:  CLINICAL IMPRESSION: Pt eager to work on strength, HEP updated and theraputty / stretches reviewed. Tolerated 11.7# gripper with white spring however difficulty sustaining grip. Patient continues to work on right UE strength, grip strength, pinch strength, pain management, and fine motor coordination skills in order to improve RUE functioning, and improve engagement in, and maximize independence with ADLs and IADL tasks.  PERFORMANCE DEFICITS in functional skills including ADLs, IADLs, coordination, dexterity, ROM, strength, pain, flexibility, FMC, GMC, mobility, balance, continence, decreased knowledge of use of DME, and UE functional use, cognitive skills including safety awareness, and psychosocial skills including.   IMPAIRMENTS are limiting patient from ADLs, IADLs, leisure, and social participation.   COMORBIDITIES may have co-morbidities  that affects occupational performance. Patient will benefit from skilled OT to address above impairments and improve overall function.  MODIFICATION OR ASSISTANCE TO COMPLETE EVALUATION: Min-Moderate modification of tasks or assist with assess necessary to complete an evaluation.  OT OCCUPATIONAL PROFILE AND HISTORY: Problem focused assessment: Including review of records relating to presenting problem.  CLINICAL DECISION MAKING: Moderate - several treatment options, min-mod task modification necessary  REHAB POTENTIAL: Good  EVALUATION COMPLEXITY: Moderate    PLAN: OT FREQUENCY: 2x/week  OT DURATION: 12 weeks  PLANNED INTERVENTIONS: self care/ADL training, therapeutic exercise, therapeutic activity, neuromuscular re-education, manual therapy, passive range of motion, balance training, functional mobility training, moist heat, cryotherapy, patient/family education, cognitive remediation/compensation, energy conservation, coping strategies training, and DME and/or  AE instructions  RECOMMENDED OTHER SERVICES: N/A  CONSULTED AND AGREED WITH PLAN OF CARE: Patient and family member/caregiver  PLAN FOR NEXT SESSION: HEP progression, neuro re-ed, therapeutic exercises  Dessie Coma, M.S. OTR/L  12/14/21, 4:49 PM  ascom [phone]    Leonides Cave, OT 12/14/2021, 4:49 PM

## 2021-12-14 NOTE — Therapy (Signed)
OUTPATIENT PHYSICAL THERAPY NEURO TREATMENT   Patient Name: Gerald Bauer MRN: [id number] DOB:11-08-1945, 76 y.o., male Today's Date: 12/14/2021   PCP: [name], PA-C REFERRING PROVIDER: Flora Lipps   PT End of Session - 12/14/21 1602     Visit Number 5    Number of Visits 24    Date for PT Re-Evaluation 02/16/22    PT Start Time 1605    PT Stop Time 3614    PT Time Calculation (min) 40 min    Equipment Utilized During Treatment Gait belt    Activity Tolerance Patient tolerated treatment well    Behavior During Therapy WFL for tasks assessed/performed                Past Medical History:  Diagnosis Date   Acid reflux    Arthritis    hands   Carotid atherosclerosis    Diabetes mellitus without complication ([place])    Ganglion cyst 02/23/2015   Hyperlipemia    Hypertension    Joint pain in fingers of right hand 02/23/2015   Dominant hand   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 07/11/2015   Prostate cancer ([place])    history   Stroke (cerebrum) ([place]) 10/06/2021   Tachycardia    Tinea pedis of both feet 01/10/2017   Past Surgical History:  Procedure Laterality Date   [place]  2013   PROSTATECTOMY  2012   Patient Active Problem List   Diagnosis Date Noted   Hemiparesis, aphasia, and dysphagia as late effects of stroke ([place]) 11/07/2021   Lower urinary tract symptoms (LUTS) 11/07/2021   Myalgia due to statin 02/21/2021   Type 2 diabetes mellitus without complication, without long-term current use of insulin ([place]) 02/21/2021   Aortic atherosclerosis ([place]) 04/27/2020   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   Onychomycosis of multiple toenails with type 2 diabetes mellitus ([place]) 07/19/2016   Carotid stenosis 03/13/2016   Hyperlipidemia associated with type 2 diabetes mellitus ([place])    Hypertension associated with type 2 diabetes mellitus ([place])    Personal history of  prostate cancer    Carotid atherosclerosis    GERD (gastroesophageal reflux disease) 08/20/2013    ONSET DATE: 10/05/21  REFERRING DIAG: E31.540 (ICD-10-CM) - Cerebral infarction due to unspecified occlusion or stenosis of left middle cerebral artery  THERAPY DIAG:  Unsteadiness on feet  Muscle weakness (generalized)  Other lack of coordination  Rationale for Evaluation and Treatment Rehabilitation  SUBJECTIVE:  SUBJECTIVE STATEMENT: Pt reports no current wrist pain or other pain, no stumbles/falls. Pt not consistently performing HEP. Pt with occ R LE foot drag.  Pt accompanied by: self and DIL [name]  PAIN today:  Are you having pain? Yes: NPRS scale: 8/10 Pain location: R wrist, B shoulders  PERTINENT HISTORY:Per H and P from 7/17 hospital d/c Pt  is a 76 year old male with history of left-sided carotid stenosis, non-insulin-dependent diabetes mellitus type 2, PAD, GERD, mild COPD, history of tobacco use, hyperlipidemia, hypertension, who presents emergency department for chief concerns of facial droop, expressive aphasia, and R sided weakness  PAIN:  Are you having pain? Yes: NPRS scale: 6/10 Pain location: B shoulders  PRECAUTIONS: Fall  WEIGHT BEARING RESTRICTIONS No  FALLS: Has patient fallen in last 6 months? Yes. Number of falls 1  LIVING ENVIRONMENT: Lives with: lives with their family and lived alone prior to onset  Lives in: House/apartment Stairs: Yes: External: 4 steps; can reach both Has following equipment at home: Walker - 2 wheeled and shower chair  PLOF: Independent  PATIENT GOALS become independent with everyday activities like he was prior to the onset of the stroke   OBJECTIVE:   DIAGNOSTIC FINDINGS: 1. Moderate sized acute ischemic left MCA distribution  infarct as  above. No associated hemorrhage or significant regional mass effect.  2. Loss of normal flow voids throughout the left ICA and MCA,  consistent with slow flow and/or occlusion. Susceptibility artifact  within proximal left MCA branches consistent with intraluminal  thrombus.  3. Underlying mild chronic microvascular ischemic disease. Tiny  remote left ACA distribution infarct.     MRA HEAD IMPRESSION:   "1. Interval near complete occlusion of the left ICA and MCA,  presumably acute in nature given the presence of the acute left MCA  territory infarct. Left A1 segment not well seen either, also likely  occluded.  2. Otherwise wide patency of the major intracranial arterial  circulation. No other hemodynamically significant or correctable  stenosis.     MRA NECK IMPRESSION:  1. 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 Some attenuated flow seen  distally within the cervical left ICA, with subsequent reocclusion  by the skull base.  2. 50% atheromatous stenosis at the origin of the cervical right  ICA. Otherwise wide patency of the right carotid artery system.  3. Wide patency of the vertebral arteries within the neck.".    COGNITION: Overall cognitive status: Within functional limits for tasks assessed and subjective history limited secondary to aphasia but able to answer 1 word questions appropriately.    SENSATION: WFL  COORDINATION: WNL with heel to shin testing    LOWER EXTREMITY ROM:   AROM WNL    LOWER EXTREMITY MMT:    MMT Right Eval Left Eval  Hip flexion 4 4+  Hip extension    Hip abduction 4 5  Hip adduction 4 5  Hip internal rotation    Hip external rotation    Knee flexion 4 5  Knee extension 4 5  Ankle dorsiflexion 4+ 5  Ankle plantarflexion 4+ 5  Ankle inversion 5 5  Ankle eversion 5 5  (Blank rows = not tested)   TRANSFERS: Assistive device utilized: Environmental consultant - 2 wheeled  Sit to stand: Modified  independence Stand to sit: Complete Independence Chair to chair: SBA  STAIRS:  Level of Assistance: SBA Stair Negotiation Technique: Step to Pattern with Bilateral Rails  Number of Stairs: 4  Height of Stairs: 6in  Comments: Right lower extremity foot catches when a sending steps and patient performs descending steps utilizing right lower extremity for more support.  Patient reports he knows he is doing this "the hard way"  GAIT: Gait pattern: decreased arm swing- Right and decreased step length- Right Distance walked: 10 M Assistive device utilized: Environmental consultant - 2 wheeled and None Level of assistance: Modified independence Comments: difficulty with turns and object navigation per DGI tesitng, able to walk without AD but only in controlled environment without obstacles or significant challenge  FUNCTIONAL TESTs:  5 times sit to stand: 16.95 sec no UE assist 10 meter walk test: .76 m/s without AD  Berg Balance Scale: 44/56 DGI:10  PATIENT SURVEYS:  FOTO to retest next session, likely with either asking patient questions or having caregiver complete patient completed but had 97% indicating he could run and jump with these and when questioned about this following patient reports he cannot run or jump with these and would benefit from filling this out again next time.  12/04/2021: 49 (goal 62)     TODAY'S TREATMENT:  Gait belt donned, CGA provided unless noted otherwise  Seated DF 20x each LE. Rates easy   [place] DF walks FWD/BCKWD x multiple reps length of // bars. R foto fatigues.  [place] hurdle obstacle/foot clearance FWD/BCKWD - x multiple reps with step to and full steps over BLE and RLE only. Still knocks into hurdle multiple times with the R LE  Seated march 20x each LE, then performed with 2# AW 20x each, 5# AW 10x each LE  LAQ with 5# AW 2x10 each LE  Standing heel raise 20x B  Single leg heel raise 15x each LE.   STS with BLE on airex 2x10  Airex beam tandem gait  10x length of beam  Static stand airex beam 30 sec EO Static stand airex beam with horiz and vertical head turns 10x for each Static stand airex beam with EC 2x30 sec  Comments: able to sustain with  no more than CGA   10 meters: No AD Semi-tandem gait 2x Gait with horizontal head turns, vertical head turns 2x each Gait with focus on B arm-swing 1x. Pt with absent L arm swing. Fast and slow gait 2x Comments: requires no more than CGA, however, did exhibit some unsteadiness with turning. PT continues to recommend pt use RW until further assessment can be completed   PATIENT EDUCATION: Education details: Pt educated throughout session about proper posture and technique with exercises. Improved exercise technique, movement at target joints, use of target muscles after min to mod verbal, visual, tactile cues.   Person educated: Patient Education method: Explanation, Demonstration, Tactile cues, and Verbal cues Education comprehension: verbalized understanding, returned demonstration, verbal cues required, tactile cues required, and needs further education   HOME EXERCISE PROGRAM:  No updates today, pt to continue as previously given  9/11: Pt to continue HEP as previously given. However, PT did provide education to use a chair with arm-rests for lateral support when performing STS   Access Code: [email or web address] URL: [email or web address] Date: 11/24/2021 Prepared by: Rivka Barbara  Exercises - Side Stepping with Counter Support  - 1 x daily - 7 x weekly - 2 sets - 10 reps - Standing Tandem Balance with Counter Support  - 1 x daily - 7 x weekly - 3 sets - 30 second hold - Standing March with Counter Support  - 1 x daily - 7  x weekly - 3 sets - 10 reps - Sit to Stand Without Arm Support  - 1 x daily - 7 x weekly - 2 sets - 10 reps    GOALS: Goals reviewed with patient? Yes  SHORT TERM GOALS: Target date: 12/22/2021   Patient will be independent in home exercise  program to improve strength/mobility for better functional independence with ADLs. Baseline: No HEP currently  Goal status: INITIAL    LONG TERM GOALS: Target date: 02/16/2022   1.  Patient (> 36 years old) will complete five times sit to stand test in < 15 seconds indicating an increased LE strength and improved balance. Baseline: 16.95 Goal status: INITIAL  2.  Patient will increase FOTO score to equal to or greater than  60   to demonstrate statistically significant improvement in mobility and quality of life.  Baseline: Test neck session Goal status: INITIAL   3.  Patient will increase Berg Balance score by > 6 points to demonstrate decreased fall risk during functional activities. Baseline: 44 Goal status: INITIAL   4.   Patient will improve DGI by 5 points or more to reduce fall risk and demonstrate improved gait ability. Baseline: 10 Goal status: INITIAL  5.   Patient will increase 10 meter walk test to >1.86ms as to improve gait speed for better community ambulation and to reduce fall risk. Baseline: .726m Goal status: INITIAL   ASSESSMENT:  CLINICAL IMPRESSION:Pt shows improvement with foot clearance and dynamic balance on this date. He is able to clear hurdle with R side with FWD step consistently but still struggles with retro-step. Pt performed multiple interventions requiring ambulation with dynamic tasks without an AD and no more than CGA. This indicates improved balance. However, further assessment such as ambulating without AD for time, and over various surfaces and around obstacles should be completed before pt discontinues using RW. PT recommends to continue using RW at this time for that reason. Pt will continue to benefit from skilled therapy to address remaining deficits in order to improve overall QoL and return to PLOF.     OBJECTIVE IMPAIRMENTS Abnormal gait, cardiopulmonary status limiting activity, decreased activity tolerance, decreased balance,  decreased endurance, decreased mobility, difficulty walking, decreased strength, and decreased safety awareness.   ACTIVITY LIMITATIONS lifting, bending, standing, squatting, stairs, transfers, and locomotion level  PARTICIPATION LIMITATIONS: meal prep, shopping, and community activity  PERSONAL FACTORS Age and 3+ comorbidities: left-sided carotid stenosis, non-insulin-dependent diabetes mellitus type 2, PAD, GERD, mild COPD, history of tobacco use, hyperlipidemia, hypertension  are also affecting patient's functional outcome.   REHAB POTENTIAL: Good  CLINICAL DECISION MAKING: Evolving/moderate complexity  EVALUATION COMPLEXITY: Moderate  PLAN: PT FREQUENCY: 2x/week  PT DURATION: 12 weeks  PLANNED INTERVENTIONS: Therapeutic exercises, Therapeutic activity, Neuromuscular re-education, Balance training, Gait training, Patient/Family education, Self Care, Joint mobilization, Stair training, Moist heat, and Manual therapy  PLAN FOR NEXT SESSION: begin POC, focus on R LE NMR to improve proprioception and coordination, obstacle clearance, dual task, continue plan HaZollie PeePT 12/14/2021, 4:55 PM

## 2021-12-15 ENCOUNTER — Encounter
Payer: Medicare Other | Attending: Physical Medicine and Rehabilitation | Admitting: Physical Medicine and Rehabilitation

## 2021-12-15 ENCOUNTER — Encounter: Payer: Self-pay | Admitting: Physical Medicine and Rehabilitation

## 2021-12-15 VITALS — BP 135/91 | HR 104 | Ht 67.0 in | Wt 163.0 lb

## 2021-12-15 DIAGNOSIS — I69320 Aphasia following cerebral infarction: Secondary | ICD-10-CM | POA: Diagnosis present

## 2021-12-15 DIAGNOSIS — E1159 Type 2 diabetes mellitus with other circulatory complications: Secondary | ICD-10-CM | POA: Diagnosis not present

## 2021-12-15 DIAGNOSIS — I69391 Dysphagia following cerebral infarction: Secondary | ICD-10-CM

## 2021-12-15 DIAGNOSIS — I152 Hypertension secondary to endocrine disorders: Secondary | ICD-10-CM | POA: Diagnosis not present

## 2021-12-15 DIAGNOSIS — I69359 Hemiplegia and hemiparesis following cerebral infarction affecting unspecified side: Secondary | ICD-10-CM

## 2021-12-15 DIAGNOSIS — K5900 Constipation, unspecified: Secondary | ICD-10-CM | POA: Diagnosis not present

## 2021-12-15 NOTE — Progress Notes (Signed)
Subjective:    Patient ID: Gerald Bauer, male    DOB: [birth year], 76 y.o.   MRN: [id number]  HPI Mrs. Oehlert is a 76 year old man who presents for hospital follow-up after CVA.  1) CVA -walking very well on therapy days- it is two days a week, all three in one day.  -has a handicap placard.  -continues to do speech therapy.  -does not any refills of any medicines  2) Constipation:  -he received liquid magnesium.  -he did not like the taste of magnesium citrate.   3) Confusion -there have been a couple of times when daughter has noted confusion.   4) Urinary retention:   Pain Inventory Average Pain 5 Pain Right Now 4 My pain is dull  LOCATION OF PAIN  Shoulder, Wrist  BOWEL Number of stools per week: 2  BLADDER Normal    Mobility use a walker how many minutes can you walk? 10 ability to climb steps?  yes do you drive?  no Do you have any goals in this area?  yes  Function retired  Neuro/Psych confusion  Prior Studies Any changes since last visit?  no  Physicians involved in your care Any changes since last visit?  no   Family History  Problem Relation Age of Onset   Dementia Mother    High Cholesterol Mother    Hypertension Mother    Arthritis Mother    Hyperlipidemia Mother    Heart disease Father    Heart attack Father    Arthritis Maternal Grandmother    Cancer Maternal Grandmother        colon   Prostate cancer Brother    Cancer Brother        prostate   Cancer Maternal Uncle        colon   COPD Maternal Uncle    Stroke Paternal Grandmother    Diabetes Paternal Grandmother    Aneurysm Paternal Grandfather    Social History   Socioeconomic History   Marital status: Widowed    Spouse name: Regino Schultze   Number of children: 2   Years of education: some college   Highest education level: 12th grade  Occupational History   Occupation: Retired    Comment: [employer]  Tobacco Use   Smoking status: Former    Packs/day: 1.00     Years: 40.00    Total pack years: 40.00    Types: Cigarettes    Quit date: 03/09/1997    Years since quitting: 24.7   Smokeless tobacco: Former   Tobacco comments:    smoking cessation materials not required  Vaping Use   Vaping Use: Never used  Substance and Sexual Activity   Alcohol use: No    Alcohol/week: 0.0 standard drinks of alcohol   Drug use: No   Sexual activity: Never  Other Topics Concern   Not on file  Social History Narrative   Pt lives alone   Social Determinants of Health   Financial Resource Strain: Low Risk  (09/14/2021)   Overall Financial Resource Strain (CARDIA)    Difficulty of Paying Living Expenses: Not hard at all  Food Insecurity: No Food Insecurity (09/14/2021)   Hunger Vital Sign    Worried About Running Out of Food in the Last Year: Never true    [place] in the Last Year: Never true  Transportation Needs: No Transportation Needs (09/14/2021)   PRAPARE - Hydrologist (Medical): No  Lack of Transportation (Non-Medical): No  Physical Activity: Sufficiently Active (09/14/2021)   Exercise Vital Sign    Days of Exercise per Week: 6 days    Minutes of Exercise per Session: 40 min  Stress: No Stress Concern Present (09/14/2021)   [place]    Feeling of Stress : Not at all  Social Connections: Moderately Integrated (09/14/2021)   Social Connection and Isolation Panel [NHANES]    Frequency of Communication with Friends and Family: Three times a week    Frequency of Social Gatherings with Friends and Family: Three times a week    Attends Religious Services: 1 to 4 times per year    Active Member of Clubs or Organizations: No    Attends Archivist Meetings: 1 to 4 times per year    Marital Status: Widowed   Past Surgical History:  Procedure Laterality Date   [place]  2013   PROSTATECTOMY  2012   Past Medical  History:  Diagnosis Date   Acid reflux    Arthritis    hands   Carotid atherosclerosis    Diabetes mellitus without complication ([place])    Ganglion cyst 02/23/2015   Hyperlipemia    Hypertension    Joint pain in fingers of right hand 02/23/2015   Dominant hand   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 07/11/2015   Prostate cancer ([place])    history   Stroke (cerebrum) ([place]) 10/06/2021   Tachycardia    Tinea pedis of both feet 01/10/2017   BP (!) 135/91   Pulse (!) 104   Ht '5\' 7"'$  (1.702 m)   Wt 163 lb (73.9 kg)   SpO2 95%   BMI 25.53 kg/m   Opioid Risk Score:   Fall Risk Score:  `1  Depression screen [facility] 2/9     12/15/2021   11:27 AM 11/07/2021    1:43 PM 09/14/2021    1:30 PM 01/23/2021    1:07 PM 09/13/2020    1:41 PM 07/21/2020    2:14 PM 04/25/2020    2:51 PM  Depression screen PHQ 2/9  Decreased Interest 0 1 0 0 0 0 0  Down, Depressed, Hopeless 0 2 0 0 0 0 0  PHQ - 2 Score 0 3 0 0 0 0 0  Altered sleeping 0 1  0  0 0  Tired, decreased energy 0 0  0  0 0  Change in appetite 0 0  0  0 0  Feeling bad or failure about yourself  0 0  0  0 0  Trouble concentrating 0 0  0  0 0  Moving slowly or fidgety/restless 0 0  0  0 0  Suicidal thoughts 0 0  0  0 0  PHQ-9 Score 0 4  0  0 0  Difficult doing work/chores Not difficult at all Not difficult at all  Not difficult at all  Not difficult at all Not difficult at all      Review of Systems  All other systems reviewed and are negative.     Objective:   Physical Exam  Gen: no distress, normal appearing HEENT: oral mucosa pink and moist, NCAT Cardio: Reg rate Chest: normal effort, normal rate of breathing Abd: soft, non-distended Ext: no edema Psych: pleasant, normal affect Skin: intact Neuro: Alert and oriented. Expressive aphasia Musculoskeletal: Ambulating with RW. Right  wrist brace in place. 4/5 strength RUE, otherwise 5/5 strength      Assessment &  Plan:   1) CVA -continue therapies -would benefit from handicap placard to increase mobility in the community -reviewed all medications and provided necessary refills. -discussed vagal nerve stimulation if strength fails to improve in 6 months.   2) Constipation:  -recommended magnesium citrate if he does not have a BM after a few days -Provided list of following foods that help with constipation and highlighted a few: 1) prunes- contain high amounts of fiber.  2) apples- has a form of dietary fiber called pectin that accelerates stool movement and increases beneficial gut bacteria 3) pears- in addition to fiber, also high in fructose and sorbitol which have laxative effect 4) figs- contain an enzyme ficin which helps to speed colonic transit 5) kiwis- contain an enzyme actinidin that improves gut motility and reduces constipation 6) oranges- rich in pectin (like apples) 7) grapefruits- contain a flavanol naringenin which has a laxative effect 8) vegetables- rich in fiber and also great sources of folate, vitamin C, and K 9) artichoke- high in inulin, prebiotic great for the microbiome 10) chicory- increases stool frequency and softness (can be added to coffee) 11) rhubarb- laxative effect 12) sweet potato- high fiber 13) beans, peas, and lentils- contain both soluble and insoluble fiber 14) chia seeds- improves intestinal health and gut flora 15) flaxseeds- laxative effect 16) whole grain rye bread- high in fiber 17) oat bran- high in soluble and insoluble fiber 18) kefir- softens stools -recommended to try at least one of these foods every day.  -drink 6-8 glasses of water per day -walk regularly, especially after meals.     3) HTN -discussed that diastolic BP is elevated -continued amlodipine -recommended checking BP at home as it is sometimes elevated in office due to white coat syndrome -recommended taking Bio[employer]'s magnesium supplement at night

## 2021-12-15 NOTE — Patient Instructions (Addendum)
Bioptemizer's 7 types  -Provided list of following foods that help with constipation and highlighted a few: 1) prunes- contain high amounts of fiber.  2) apples- has a form of dietary fiber called pectin that accelerates stool movement and increases beneficial gut bacteria 3) pears- in addition to fiber, also high in fructose and sorbitol which have laxative effect 4) figs- contain an enzyme ficin which helps to speed colonic transit 5) kiwis- contain an enzyme actinidin that improves gut motility and reduces constipation 6) oranges- rich in pectin (like apples) 7) grapefruits- contain a flavanol naringenin which has a laxative effect 8) vegetables- rich in fiber and also great sources of folate, vitamin C, and K 9) artichoke- high in inulin, prebiotic great for the microbiome 10) chicory- increases stool frequency and softness (can be added to coffee) 11) rhubarb- laxative effect 12) sweet potato- high fiber 13) beans, peas, and lentils- contain both soluble and insoluble fiber 14) chia seeds- improves intestinal health and gut flora 15) flaxseeds- laxative effect 16) whole grain rye bread- high in fiber 17) oat bran- high in soluble and insoluble fiber 18) kefir- softens stools -recommended to try at least one of these foods every day.  -drink 6-8 glasses of water per day -walk regularly, especially after meals.   Cefazolin NAC

## 2021-12-18 ENCOUNTER — Ambulatory Visit: Payer: Medicare Other

## 2021-12-18 ENCOUNTER — Ambulatory Visit: Payer: Medicare Other | Admitting: Speech Pathology

## 2021-12-18 ENCOUNTER — Ambulatory Visit: Payer: Medicare Other | Admitting: Occupational Therapy

## 2021-12-18 DIAGNOSIS — R269 Unspecified abnormalities of gait and mobility: Secondary | ICD-10-CM

## 2021-12-18 DIAGNOSIS — R482 Apraxia: Secondary | ICD-10-CM

## 2021-12-18 DIAGNOSIS — M6281 Muscle weakness (generalized): Secondary | ICD-10-CM

## 2021-12-18 DIAGNOSIS — R4701 Aphasia: Secondary | ICD-10-CM

## 2021-12-18 DIAGNOSIS — R2681 Unsteadiness on feet: Secondary | ICD-10-CM

## 2021-12-18 DIAGNOSIS — I63512 Cerebral infarction due to unspecified occlusion or stenosis of left middle cerebral artery: Secondary | ICD-10-CM

## 2021-12-18 DIAGNOSIS — R262 Difficulty in walking, not elsewhere classified: Secondary | ICD-10-CM

## 2021-12-18 NOTE — Therapy (Signed)
OUTPATIENT PHYSICAL THERAPY NEURO TREATMENT   Patient Name: Gerald Bauer MRN: [id number] DOB:1946/01/26, 76 y.o., male Today's Date: 12/18/2021   PCP: [name], PA-C REFERRING PROVIDER: Bary Leriche, PA-C   PT End of Session - 12/18/21 1420     Visit Number 6    Number of Visits 24    Date for PT Re-Evaluation 02/16/22    PT Start Time 1430    PT Stop Time 1696    PT Time Calculation (min) 44 min    Equipment Utilized During Treatment Gait belt    Activity Tolerance Patient tolerated treatment well    Behavior During Therapy WFL for tasks assessed/performed                 Past Medical History:  Diagnosis Date   Acid reflux    Arthritis    hands   Carotid atherosclerosis    Diabetes mellitus without complication ([place])    Ganglion cyst 02/23/2015   Hyperlipemia    Hypertension    Joint pain in fingers of right hand 02/23/2015   Dominant hand   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 07/11/2015   Prostate cancer ([place])    history   Stroke (cerebrum) ([place]) 10/06/2021   Tachycardia    Tinea pedis of both feet 01/10/2017   Past Surgical History:  Procedure Laterality Date   [place]  2013   PROSTATECTOMY  2012   Patient Active Problem List   Diagnosis Date Noted   Hemiparesis, aphasia, and dysphagia as late effects of stroke ([place]) 11/07/2021   Lower urinary tract symptoms (LUTS) 11/07/2021   Myalgia due to statin 02/21/2021   Type 2 diabetes mellitus without complication, without long-term current use of insulin ([place]) 02/21/2021   Aortic atherosclerosis ([place]) 04/27/2020   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   Onychomycosis of multiple toenails with type 2 diabetes mellitus ([place]) 07/19/2016   Carotid stenosis 03/13/2016   Hyperlipidemia associated with type 2 diabetes mellitus ([place])    Hypertension associated with type 2 diabetes mellitus ([place])    Personal history of  prostate cancer    Carotid atherosclerosis    GERD (gastroesophageal reflux disease) 08/20/2013    ONSET DATE: 10/05/21  REFERRING DIAG: V89.381 (ICD-10-CM) - Cerebral infarction due to unspecified occlusion or stenosis of left middle cerebral artery  THERAPY DIAG:  Muscle weakness (generalized)  Left middle cerebral artery stroke ([facility])  Unsteadiness on feet  Difficulty in walking, not elsewhere classified  Abnormality of gait and mobility  Rationale for Evaluation and Treatment Rehabilitation  SUBJECTIVE:  SUBJECTIVE STATEMENT: Pt is able to use restroom without walker since last session. No falls or LOB since last session.   Pt accompanied by: self and DIL [name]  PAIN today:  Are you having pain? Yes: NPRS scale: 8/10 Pain location: R wrist, B shoulders  PERTINENT HISTORY:Per H and P from 7/17 hospital d/c Pt  is a 76 year old male with history of left-sided carotid stenosis, non-insulin-dependent diabetes mellitus type 2, PAD, GERD, mild COPD, history of tobacco use, hyperlipidemia, hypertension, who presents emergency department for chief concerns of facial droop, expressive aphasia, and R sided weakness  PAIN:  Are you having pain? Yes: NPRS scale: 6/10 Pain location: B shoulders  PRECAUTIONS: Fall  WEIGHT BEARING RESTRICTIONS No  FALLS: Has patient fallen in last 6 months? Yes. Number of falls 1  LIVING ENVIRONMENT: Lives with: lives with their family and lived alone prior to onset  Lives in: House/apartment Stairs: Yes: External: 4 steps; can reach both Has following equipment at home: Walker - 2 wheeled and shower chair  PLOF: Independent  PATIENT GOALS become independent with everyday activities like he was prior to the onset of the stroke   OBJECTIVE:   DIAGNOSTIC  FINDINGS: 1. 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 No associated hemorrhage or significant regional mass effect.  2. Loss of normal flow voids throughout the left ICA and MCA,  consistent with slow flow and/or occlusion. Susceptibility artifact  within proximal left MCA branches consistent with intraluminal  thrombus.  3. Underlying mild chronic microvascular ischemic disease. Tiny  remote left ACA distribution infarct.     MRA HEAD IMPRESSION:   "1. Interval near complete occlusion of the left ICA and MCA,  presumably acute in nature given the presence of the acute left MCA  territory infarct. Left A1 segment not well seen either, also likely  occluded.  2. Otherwise wide patency of the major intracranial arterial  circulation. No other hemodynamically significant or correctable  stenosis.     MRA NECK IMPRESSION:  1. 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 Some attenuated flow seen  distally within the cervical left ICA, with subsequent reocclusion  by the skull base.  2. 50% atheromatous stenosis at the origin of the cervical right  ICA. Otherwise wide patency of the right carotid artery system.  3. Wide patency of the vertebral arteries within the neck.".    COGNITION: Overall cognitive status: Within functional limits for tasks assessed and subjective history limited secondary to aphasia but able to answer 1 word questions appropriately.    SENSATION: WFL  COORDINATION: WNL with heel to shin testing    LOWER EXTREMITY ROM:   AROM WNL    LOWER EXTREMITY MMT:    MMT Right Eval Left Eval  Hip flexion 4 4+  Hip extension    Hip abduction 4 5  Hip adduction 4 5  Hip internal rotation    Hip external rotation    Knee flexion 4 5  Knee extension 4 5  Ankle dorsiflexion 4+ 5  Ankle plantarflexion 4+ 5  Ankle inversion 5 5  Ankle eversion 5 5  (Blank rows = not  tested)   TRANSFERS: Assistive device utilized: Environmental consultant - 2 wheeled  Sit to stand: Modified independence Stand to sit: Complete Independence Chair to chair: SBA  STAIRS:  Level of Assistance: SBA Stair Negotiation Technique: Step to Pattern with Bilateral Rails Number of Stairs: 4  Height of Stairs: 6in  Comments: Right lower extremity foot catches when a sending steps and patient performs descending steps utilizing right lower extremity for more support.  Patient reports he knows he is doing this "the hard way"  GAIT: Gait pattern: decreased arm swing- Right and decreased step length- Right Distance walked: 10 M Assistive device utilized: Environmental consultant - 2 wheeled and None Level of assistance: Modified independence Comments: difficulty with turns and object navigation per DGI tesitng, able to walk without AD but only in controlled environment without obstacles or significant challenge  FUNCTIONAL TESTs:  5 times sit to stand: 16.95 sec no UE assist 10 meter walk test: .76 m/s without AD  Berg Balance Scale: 44/56 DGI:10  PATIENT SURVEYS:  FOTO to retest next session, likely with either asking patient questions or having caregiver complete patient completed but had 97% indicating he could run and jump with these and when questioned about this following patient reports he cannot run or jump with these and would benefit from filling this out again next time.  12/04/2021: 49 (goal 62)     TODAY'S TREATMENT:  Gait belt donned, CGA provided unless noted otherwise BP: 142/90   Airex pad eyes closed 30 seconds  Bosu ball flat side up; 60 seconds Bosu ball round side up: -lateral modified lunge 10x each LE  -modified forward lunge 10x each LE   Sit to stand punch cross body 10x   Speed ladder:  One foot each square 4x length Lateral stepping two feet per square 4x length   Seated:  5lb ankle weight:  -LAQ 10x each LE -march 10x each LE Seated on dynadisc:   -anterior/posterior weight shift 10x -medial and lateral weight shift 10x -march with arms crossed 15x Hamstring curl GTB 15x   PATIENT EDUCATION: Education details: Pt educated throughout session about proper posture and technique with exercises. Improved exercise technique, movement at target joints, use of target muscles after min to mod verbal, visual, tactile cues.   Person educated: Patient Education method: Explanation, Demonstration, Tactile cues, and Verbal cues Education comprehension: verbalized understanding, returned demonstration, verbal cues required, tactile cues required, and needs further education   HOME EXERCISE PROGRAM:  No updates today, pt to continue as previously given  9/11: Pt to continue HEP as previously given. However, PT did provide education to use a chair with arm-rests for lateral support when performing STS   Access Code: [email or web address] URL: [email or web address] Date: 11/24/2021 Prepared by: Rivka Barbara  Exercises - Side Stepping with Counter Support  - 1 x daily - 7 x weekly - 2 sets - 10 reps - Standing Tandem Balance with Counter Support  - 1 x daily - 7 x weekly - 3 sets - 30 second hold - Standing March with Counter Support  - 1 x daily - 7 x weekly - 3 sets - 10 reps - Sit to Stand Without Arm Support  - 1 x daily - 7 x weekly - 2 sets - 10 reps    GOALS: Goals reviewed with patient? Yes  SHORT TERM GOALS: Target date: 12/22/2021   Patient will be independent in home exercise program to improve strength/mobility for better functional independence with ADLs. Baseline: No HEP currently  Goal status: INITIAL    LONG TERM GOALS: Target date: 02/16/2022   1.  Patient (> 1 years old) will complete five times sit to stand test in < 15 seconds indicating an increased LE strength and improved balance. Baseline: 16.95 Goal status: INITIAL  2.  Patient will increase FOTO score to equal to or greater than  60   to  demonstrate statistically significant improvement in mobility and quality of life.  Baseline: Test neck session Goal status: INITIAL   3.  Patient will increase Berg Balance score by > 6 points to demonstrate decreased fall risk during functional activities. Baseline: 44 Goal status: INITIAL   4.   Patient will improve DGI by 5 points or more to reduce fall risk and demonstrate improved gait ability. Baseline: 10 Goal status: INITIAL  5.   Patient will increase 10 meter walk test to >1.1ms as to improve gait speed for better community ambulation and to reduce fall risk. Baseline: .773m Goal status: INITIAL   ASSESSMENT:  CLINICAL IMPRESSION:Pt able to tolerate increased stability challenge interventions well. No pain in hand reported this session.  Patient's safety awareness is limited and close supervision and/or CGA required. Patient session limited by toileting during session. Pt will continue to benefit from skilled therapy to address remaining deficits in order to improve overall QoL and return to PLOF.     OBJECTIVE IMPAIRMENTS Abnormal gait, cardiopulmonary status limiting activity, decreased activity tolerance, decreased balance, decreased endurance, decreased mobility, difficulty walking, decreased strength, and decreased safety awareness.   ACTIVITY LIMITATIONS lifting, bending, standing, squatting, stairs, transfers, and locomotion level  PARTICIPATION LIMITATIONS: meal prep, shopping, and community activity  PERSONAL FACTORS Age and 3+ comorbidities: left-sided carotid stenosis, non-insulin-dependent diabetes mellitus type 2, PAD, GERD, mild COPD, history of tobacco use, hyperlipidemia, hypertension  are also affecting patient's functional outcome.   REHAB POTENTIAL: Good  CLINICAL DECISION MAKING: Evolving/moderate complexity  EVALUATION COMPLEXITY: Moderate  PLAN: PT FREQUENCY: 2x/week  PT DURATION: 12 weeks  PLANNED INTERVENTIONS: Therapeutic exercises,  Therapeutic activity, Neuromuscular re-education, Balance training, Gait training, Patient/Family education, Self Care, Joint mobilization, Stair training, Moist heat, and Manual therapy  PLAN FOR NEXT SESSION: begin POC, focus on R LE NMR to improve proprioception and coordination, obstacle clearance, dual task, continue plan MaJanna ArchPT 12/18/2021, 3:24 PM

## 2021-12-18 NOTE — Therapy (Unsigned)
OUTPATIENT SPEECH LANGUAGE PATHOLOGY TREATMENT AND PROGRESS NOTE   Patient Name: Gerald Bauer MRN: [id number] DOB:July 08, 1945, 76 y.o., male Today's Date: 12/18/2021  Speech Therapy Progress Note  Dates of Reporting Period: 11/06/2021 to 12/18/2021  Objective: Patient has been seen for 10 speech therapy sessions this reporting period targeting aphasia and verbal apraxia. Patient is making progress toward LTGs and met 4/5 active STGs this reporting period. Additional STGs added. See skilled intervention, clinical impressions, and goals below for details.  PCP: [name], PA-C REFERRING PROVIDER: Meriam Sprague   End of Session - 12/18/21 1608     Visit Number 10    Number of Visits 25    Date for SLP Re-Evaluation 02/04/22    SLP Start Time 61    SLP Stop Time  1700    SLP Time Calculation (min) 60 min    Activity Tolerance Patient tolerated treatment well             Past Medical History:  Diagnosis Date   Acid reflux    Arthritis    hands   Carotid atherosclerosis    Diabetes mellitus without complication ([place])    Ganglion cyst 02/23/2015   Hyperlipemia    Hypertension    Joint pain in fingers of right hand 02/23/2015   Dominant hand   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 07/11/2015   Prostate cancer ([place])    history   Stroke (cerebrum) ([place]) 10/06/2021   Tachycardia    Tinea pedis of both feet 01/10/2017   Past Surgical History:  Procedure Laterality Date   [place]  2013   PROSTATECTOMY  2012   Patient Active Problem List   Diagnosis Date Noted   Hemiparesis, aphasia, and dysphagia as late effects of stroke ([place]) 11/07/2021   Lower urinary tract symptoms (LUTS) 11/07/2021   Myalgia due to statin 02/21/2021   Type 2 diabetes mellitus without complication, without long-term current use of insulin ([place]) 02/21/2021   Aortic atherosclerosis ([place]) 04/27/2020    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   Onychomycosis of multiple toenails with type 2 diabetes mellitus ([place]) 07/19/2016   Carotid stenosis 03/13/2016   Hyperlipidemia associated with type 2 diabetes mellitus ([place])    Hypertension associated with type 2 diabetes mellitus ([place])    Personal history of prostate cancer    Carotid atherosclerosis    GERD (gastroesophageal reflux disease) 08/20/2013    ONSET DATE: 10/05/2021   REFERRING DIAG: Cerebral infarction due to unspecified occlusion of left middle cerebral artery  THERAPY DIAG:  Aphasia  Verbal apraxia  Rationale for Evaluation and Treatment Rehabilitation  SUBJECTIVE:   SUBJECTIVE STATEMENT:  Arrives with [name].  Pt accompanied by: family member daughter in Physicist, medical  PERTINENT HISTORY: Pt  is a 76 year old male with history of left-sided carotid stenosis, non-insulin-dependent diabetes mellitus type 2, PAD, GERD, mild COPD, history of tobacco use, hyperlipidemia, hypertension, who presented 10/05/21 to [facility] ED with facial droop, expressive aphasia, and R sided weakness and found to have left MCA CVA. He was admitted to [facility] 7/17-10/31/21. Advanced to dysphagia 3/thin liquids by cup by time of d/c from CIR. 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 Difficult to fully assess cognition given severity of linguistic impairment; however, does present with decreased safety awareness, diminished problem-solving, emergent awareness, and mild impulsivity with movement."  DIAGNOSTIC FINDINGS: MBS 10/19/21: "Pt presents with overall mild oropharyngeal dysphagia. Oral phase c/b piecemeal deglutition, decreased mastication, decreased bolus cohesion, premature spillage, and weak lingual manipulation. Pharyngeal phase c/b reduced pharyngeal peristalsis + reduced BOT approximation to PPW, which results in mild to moderate vallecular and pyriform sinuses  residuals (vallecula > pyriform sinuses for solids, pyriform sinuses > vallecula for liquids). No penetration nor aspiration appreciated with thin liquid via cup, Dysphagia 1 (puree), Dysphagia 2 (chopped), Dysphagia 3 (mechanical soft), or with 13 mm Barium tablet placed in puree. Once instance of sensed aspiration with intake of large bolus of thin liquid via straw due to swallow initiation delay to pyriform sinuses and mistiming of swallow-breath cycle; despite strong reflexive cough aspirant was not fully cleared from trachea. Pt's level of swallow initiation was noted to vary with liquid consumption (over base of epiglottis and at pyriform sinuses) with level of swallow initiation for solids to be consistent at the vallecula. Pharyngeal stasis was partially cleared with reflexive secondary swallow." MRI brain 10/05/21: 1. 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 No associated hemorrhage or significant regional mass effect. 2. Loss of normal flow voids throughout the left ICA and MCA, consistent with slow flow and/or occlusion. 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 3. Underlying mild chronic microvascular ischemic disease. Tiny remote left ACA distribution infarct.   PAIN:  Are you having pain? No Pain location: left shoulder   PATIENT GOALS talk better  OBJECTIVE:   TODAY'S TREATMENT:  SLP provided pt with aphasia card as well as emergency information visual aid today. Role played emergency call with mod-max cues necessary for intelligibility of address (70%). Reassessed STGs; pt generated 4-6 descriptions for targets with rare min cues (supported conversation/cues for slower rate for intelligibility at times). Targeted approximation of words from personally relevant vocabulary; words from list 90% intelligible. Patient independently attempted to place each word in a sentence (sentences ~50% intelligible  overall).      PATIENT EDUCATION: Education details: see today's treatment for details Person educated: Patient Education method: Explanation, Demonstration, and written supports Education comprehension: verbalized understanding and needs further education   GOALS: Goals reviewed with patient? Yes  SHORT TERM GOALS: Target date: 10 sessions  Patient will participate in clinical assessment of swallow function with goals added as needed. Baseline: Goal status: MET  2.  Pt will communicate emergency information 100% accuracy using visual aid if necessary.  Baseline:  Goal status: IN PROGRESS (partially met, will continue; 70% intelligible with mod-max cues)  3.  Pt will approximate personally relevant words and phrases >80% accuracy using script training and min visual cues for apraxia.  Baseline:  Goal status: MET  4.  Pt will generate at least 4 descriptors of target word 80% of the time using semantic feature analysis to improve abilities in wordfinding and resolving communication breakdowns.  Baseline:  Goal status: MET  5.  Pt will complete HEP for dysphagia with rare min A. Baseline:  Goal status: MET  6.  Pt will generate sentences using personally relevant words list for >80% intelligibility. Baseline: 50% intelligibility Goal status: INITIAL  5.  Pt will improve expressive language skills by taking 4-6 turns in simple conversation, with supported conversation strategies if needed. Baseline:  Goal status: INITIAL    LONG TERM GOALS: Target date: 02/04/2022  Pt will engage in 5-8 minutes simple-mod complex conversation re: topic of interest with supported conversation, aphasia compensations.  Baseline:  Goal status:  IN PROGRESS  2.  Pt will ID and attempt repair of communication breakdowns >90% of the time in session.   Baseline:  Goal status: IN PROGRESS  3.  Pt/family will demonstrate knowledge of community resources and activities to support  language/communication. Baseline:  Goal status: IN PROGRESS  4.  Pt will demonstrate use of swallowing precautions independently with s/sx aspiration <5% of trials. Baseline:  Goal status: IN PROGRESS   ASSESSMENT:  CLINICAL IMPRESSION: Patient presents with moderate Broca's aphasia, verbal apraxia (functionally more severe than initially assessed), dysarthria (difficult to determine extent due to language deficits and apraxia), and mild oropharyngeal dysphagia. Pt beginning to initiate repair of breakdowns with alphabet board, drawing, and description, although extended time and mod cues required. Pt approximating personally relevant vocabulary (1-3 syllable) 90% acc.  Patient demonstrates good use of nonverbals and Y/N responses, however is significantly limited in his ability to relay thoughts, feelings, and non-contextual or abstract information. Pt appears motivated and is demonstrating excellent progress toward goals. I recommend skilled ST to improve pt's swallowing, language and motor speech skills to increase swallowing safety, improve communication and reduce frustration.   OBJECTIVE IMPAIRMENTS include expressive language, receptive language, aphasia, apraxia, dysarthria, and dysphagia. These impairments are limiting patient from managing medications, managing appointments, managing finances, household responsibilities, ADLs/IADLs, effectively communicating at home and in community, and safety when swallowing. Factors affecting potential to achieve goals and functional outcome are cooperation/participation level; pt appears highly motivated with good family support. Patient will benefit from skilled SLP services to address above impairments and improve overall function.  REHAB POTENTIAL: Excellent  PLAN: SLP FREQUENCY: 2x/week  SLP DURATION: 12 weeks  PLANNED INTERVENTIONS: Aspiration precaution training, Pharyngeal strengthening exercises, Diet toleration management , Language  facilitation, Environmental controls, Trials of upgraded texture/liquids, Cognitive reorganization, Internal/external aids, Functional tasks, Multimodal communication approach, SLP instruction and feedback, Compensatory strategies, and Patient/family education    [name], MS, Actor [phone]

## 2021-12-19 ENCOUNTER — Encounter: Payer: Self-pay | Admitting: Occupational Therapy

## 2021-12-19 NOTE — Therapy (Addendum)
OUTPATIENT OCCUPATIONAL THERAPY NEURO TREATMENT  Patient Name: Gerald Bauer MRN: [id number] DOB:11-17-1945, 76 y.o., male Today's Date: 12/19/2021  PCP: Threasa Alpha, PA REFERRING PROVIDER: Reesa Chew   OT End of Session - 12/19/21 0840     Visit Number 12    Number of Visits 24    Date for OT Re-Evaluation 01/29/22    OT Start Time 8563    OT Stop Time 1600    OT Time Calculation (min) 45 min    Activity Tolerance Patient tolerated treatment well    Behavior During Therapy [facility] for tasks assessed/performed            Past Medical History:  Diagnosis Date   Acid reflux    Arthritis    hands   Carotid atherosclerosis    Diabetes mellitus without complication ([place])    Ganglion cyst 02/23/2015   Hyperlipemia    Hypertension    Joint pain in fingers of right hand 02/23/2015   Dominant hand   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 07/11/2015   Prostate cancer ([place])    history   Stroke (cerebrum) ([place]) 10/06/2021   Tachycardia    Tinea pedis of both feet 01/10/2017   Past Surgical History:  Procedure Laterality Date   [place]  2013   PROSTATECTOMY  2012   Patient Active Problem List   Diagnosis Date Noted   Hemiparesis, aphasia, and dysphagia as late effects of stroke ([place]) 11/07/2021   Lower urinary tract symptoms (LUTS) 11/07/2021   Myalgia due to statin 02/21/2021   Type 2 diabetes mellitus without complication, without long-term current use of insulin ([place]) 02/21/2021   Aortic atherosclerosis ([place]) 04/27/2020   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 is a Brewing technologist, and has a has a hobby setting up train displays.  PERTINENT HISTORY: Per chart, Gerald Bauer is a 76 year old right-handed male with history of hypertension, hyperlipidemia, diabetes mellitus, prostate cancer/prostatectomy 2012, quit smoking 24 years ago.  [facility] with right side weakness/facial droop and expressive aphasia.  Blood pressure 155/102.  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  No associated hemorrhage or significant mass effect.    PAIN:  Are you having pain? Yes: Pain location:  Ulnar aspect of the right hand 8/10 pain Pain description: Arthritic Pain Aggravating factors:   Relieving factors:    FALLS: Has patient fallen in last 6 months? Yes. Number of falls 3 since CVA  PLOF: Independent/retired Dealer  PATIENT GOALS : Pt points to his hand (wants to be able to use his hand)  OBJECTIVE:   HAND DOMINANCE: Right   FUNCTIONAL OUTCOME MEASURES: FOTO: 48  UUPPER EXTREMITY ROM      Active ROM Right eval Left Eval WNL Right 12/11/21  Shoulder flexion 90 (135)   95 (110)  Shoulder  abduction 95 (110)   80 (90)  Wrist flex     54  Wrist ext     41    UPPER EXTREMITY MMT:      MMT Right eval Left Eval 5/5 Right 12/11/21  Shoulder flexion 3-   -3  Shoulder abduction 3-   -3  Shoulder adduction        Shoulder extension        Shoulder internal rotation 3-      Shoulder external rotation 3-      Middle trapezius        Lower trapezius        Elbow flexion 4-   4  Elbow extension 4+   5  Wrist flexion 4   NT d/t pain  Wrist extension 4-   NT d/t pain  (Blank rows = not tested)   HAND FUNCTION: Eval:         Grip strength: Right: 14  lbs; Left: 58 lbs, Lateral pinch: Right: 7 lbs, Left: 19 lbs, and 3 point pinch: Right: 2 lbs, Left: 16 lbs 10th visit: Grip strength: Right: 16  lbs;                                Lateral pinch: Right: 9  lbs;                            3 point pinch: Right: 5 lbs   Eval: COORDINATION: Finger Nose Finger test: difficult/lacks precision with reaching targets 9 Hole Peg test: Right: unable sec; Left: 27 sec Able to oppose 2nd and 3rd digits to thumb on R hand   10th visit 9 hole Peg test: Right: 11 min, 44 sec    TODAY'S TREATMENT:   Therapeutic Exercise:  Therapeutic exercise:  Pt. worked on [employer], and reciprocal motion using the UBE while seated for 8 min. with minimal resistance. Constant monitoring was provided. Pt. performed gross gripping with a gross grip strengthener. Pt. worked on sustaining grip while grasping pegs and placing them into a container placed on the tabletop to the right of the pegboard. The Gripper was set to 11.2# of grip strength resistance.   Patient worked on right hand lateral and three-point pinch strengthening. Patient required cues to avoid using the left hand to position the clips. Pt. performed resistive EZ Board exercises for forearm supination/pronation, wrist flexion/extension using gross grasp, and lateral pinch (key) grasp. Pt. performed resistive EZ Board exercises angled in several planes to promote shoulder flexion, abduction, and wrist flexion, and extension while performing resistive wrist flexion and extension with a gross grip. Board.  Pt. Reports 8/10 right hand pain. Pt. is improving with right lateral pinch strength. Pt. was able able to accurately sustain lateral pinch on the green resistive clips in preparation for placing them onto a small horizontal dowel. Pt. Was able to clear the board when gripping. Pt. Required tactile cues with the gripper in a vertical position when grasping the smallest peg. Pt. Required cues, and assist for  lateral grasp hand position on the [place] Board. Patient continues to work on right UE strength, grip strength, pinch strength, and fine motor coordination skills in order to improve RUE functioning, and improve engagement in, and maximize independence with ADLs and IADL tasks.  PATIENT EDUCATION: Education details: HEP progression Person educated: pt Education method: Explanation and Verbal cues, handout  Education comprehension: verbalized understanding, returned demo   HOME EXERCISE PROGRAM: theraputty  GOALS: Goals reviewed with patient? Yes   SHORT TERM GOALS: Target date: 12/18/21   Pt will be indep to perform HEP for increasing strength and coordination throughout RUE.  Baseline: Not yet initiated; 10th: putty given but pt reports limited use, instructed in table slides and self PROM for R wrist/digits.   Goal status: ongoing   2.  Pt will manage clothing fasteners with extra time, using RUE as an assist  Baseline: unable; pt has elastic laces and wears elastic waisted pants; 10th: pt uses R hand as an assist to zip and button jeans with extra time; not yet tried small buttons on a shirt.  Pt continues to use elastic laces on shoes. Goal status: ongoing   LONG TERM GOALS: Target date: 01/29/22   Pt will increase FOTO score to 55 or better to indicate improved performance with daily tasks.  Baseline: 48; 10th: 48 Goal status: ongoing   2.  Pt will increase R grip strength by 10 or more lbs to enable pt to hold and carry light ADL supplies without dropping.  Baseline: R grip 14#, L 58#; 10th visit: R grip 16 Goal status: INITIAL   3.  Pt will increase RUE strength to be able to engage RUE into ADLs at least 50% of the time. Baseline: Pt using L non-dominant arm to manage ADLs; 10th visit: Son reports he constantly sees pt attempt to use his R arm for daily tasks, but continues to be limited and still must use LUE for most tasks (see chart for RUE MMT) Goal status: ongoing   4.   Pt will complete 9 hole peg test on the R in 3 min or less to work towards ability to use R hand to pick up small ADL supplies.  Baseline: Pt can remove a peg but can not pick one up; 10th visit: R 9 hole completion 11 min 44 sec Goal status: ongoing    ASSESSMENT:  CLINICAL IMPRESSION:   Pt. Reports 8/10 right hand pain. Pt. is improving with right lateral pinch strength. Pt. was able able to accurately sustain lateral pinch on the green resistive clips in preparation for placing them onto a small horizontal dowel. Pt. Was able to clear the board when gripping. Pt. Required tactile cues with the gripper in a vertical position when grasping the smallest peg. Pt. Required cues, and assist for lateral grasp hand position on the [place] Board. Patient continues to work on right UE strength, grip strength, pinch strength, and fine motor coordination skills in order to improve RUE functioning, and improve engagement in, and maximize independence with ADLs and IADL tasks.  PERFORMANCE DEFICITS in functional skills including ADLs, IADLs, coordination, dexterity, ROM, strength, pain, flexibility, FMC, GMC, mobility, balance, continence, decreased knowledge of use of DME, and UE functional use, cognitive skills including safety awareness, and psychosocial skills including.   IMPAIRMENTS are limiting patient from ADLs, IADLs, leisure, and social participation.   COMORBIDITIES may have co-morbidities  that affects occupational performance. Patient will benefit from skilled OT to address above impairments and improve overall function.  MODIFICATION OR ASSISTANCE TO COMPLETE EVALUATION: Min-Moderate modification of tasks or assist with assess necessary to complete an evaluation.  OT OCCUPATIONAL PROFILE AND HISTORY: Problem focused assessment: Including review of records relating to presenting problem.  CLINICAL DECISION MAKING: Moderate - several treatment options, min-mod task modification necessary  REHAB  POTENTIAL: Good  EVALUATION COMPLEXITY: Moderate  PLAN: OT FREQUENCY: 2x/week  OT DURATION: 12 weeks  PLANNED INTERVENTIONS: self care/ADL training, therapeutic exercise, therapeutic activity, neuromuscular re-education, manual therapy, passive range of motion, balance training, functional mobility training, moist heat, cryotherapy, patient/family education, cognitive remediation/compensation, energy conservation, coping strategies training, and DME and/or AE instructions  RECOMMENDED OTHER SERVICES: N/A  CONSULTED AND AGREED WITH PLAN OF CARE: Patient and family member/caregiver  PLAN FOR NEXT SESSION: HEP progression, neuro re-ed, therapeutic exercises  Harrel Carina, MS, OTR/L   Harrel Carina, OT 12/19/2021, 8:43 AM

## 2021-12-20 ENCOUNTER — Ambulatory Visit: Payer: Medicare Other

## 2021-12-20 ENCOUNTER — Ambulatory Visit: Payer: Medicare Other | Admitting: Speech Pathology

## 2021-12-20 DIAGNOSIS — R482 Apraxia: Secondary | ICD-10-CM

## 2021-12-20 DIAGNOSIS — R2681 Unsteadiness on feet: Secondary | ICD-10-CM

## 2021-12-20 DIAGNOSIS — R262 Difficulty in walking, not elsewhere classified: Secondary | ICD-10-CM

## 2021-12-20 DIAGNOSIS — I63512 Cerebral infarction due to unspecified occlusion or stenosis of left middle cerebral artery: Secondary | ICD-10-CM

## 2021-12-20 DIAGNOSIS — M6281 Muscle weakness (generalized): Secondary | ICD-10-CM

## 2021-12-20 DIAGNOSIS — R4701 Aphasia: Secondary | ICD-10-CM

## 2021-12-20 DIAGNOSIS — R269 Unspecified abnormalities of gait and mobility: Secondary | ICD-10-CM | POA: Diagnosis not present

## 2021-12-20 DIAGNOSIS — R278 Other lack of coordination: Secondary | ICD-10-CM

## 2021-12-20 NOTE — Therapy (Signed)
OUTPATIENT PHYSICAL THERAPY NEURO TREATMENT   Patient Name: Gerald Bauer MRN: [id number] DOB:02-Jul-1945, 76 y.o., male Today's Date: 12/20/2021   PCP: [name], PA-C REFERRING PROVIDER: Bary Leriche, PA-C   PT End of Session - 12/20/21 1018     Visit Number 7    Number of Visits 24    Date for PT Re-Evaluation 02/16/22    PT Start Time 60    PT Stop Time 1100    PT Time Calculation (min) 41 min    Equipment Utilized During Treatment Gait belt    Activity Tolerance Patient tolerated treatment well    Behavior During Therapy WFL for tasks assessed/performed                  Past Medical History:  Diagnosis Date   Acid reflux    Arthritis    hands   Carotid atherosclerosis    Diabetes mellitus without complication ([place])    Ganglion cyst 02/23/2015   Hyperlipemia    Hypertension    Joint pain in fingers of right hand 02/23/2015   Dominant hand   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 07/11/2015   Prostate cancer ([place])    history   Stroke (cerebrum) ([place]) 10/06/2021   Tachycardia    Tinea pedis of both feet 01/10/2017   Past Surgical History:  Procedure Laterality Date   [place]  2013   PROSTATECTOMY  2012   Patient Active Problem List   Diagnosis Date Noted   Hemiparesis, aphasia, and dysphagia as late effects of stroke ([place]) 11/07/2021   Lower urinary tract symptoms (LUTS) 11/07/2021   Myalgia due to statin 02/21/2021   Type 2 diabetes mellitus without complication, without long-term current use of insulin ([place]) 02/21/2021   Aortic atherosclerosis ([place]) 04/27/2020   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   Onychomycosis of multiple toenails with type 2 diabetes mellitus ([place]) 07/19/2016   Carotid stenosis 03/13/2016   Hyperlipidemia associated with type 2 diabetes mellitus ([place])    Hypertension associated with type 2 diabetes mellitus ([place])    Personal history of  prostate cancer    Carotid atherosclerosis    GERD (gastroesophageal reflux disease) 08/20/2013    ONSET DATE: 10/05/21  REFERRING DIAG: O35.009 (ICD-10-CM) - Cerebral infarction due to unspecified occlusion or stenosis of left middle cerebral artery  THERAPY DIAG:  Unsteadiness on feet  Muscle weakness (generalized)  Difficulty in walking, not elsewhere classified  Other lack of coordination  Rationale for Evaluation and Treatment Rehabilitation  SUBJECTIVE:  SUBJECTIVE STATEMENT: Pt reports pain in B shoulders. He reports no falls/stumbles. Pt did a lot of walking at his grandson's house using RW outside but not inside per his DIL. She reports he is feeling comfortable walking inside without RW.   Pt accompanied by: self and DIL Gerald Bauer  PAIN today:  Are you having pain? Yes: NPRS scale: 8/10 Pain location: R wrist, B shoulders  PERTINENT HISTORY:Per H and P from 7/17 hospital d/c Pt  is a 76 year old male with history of left-sided carotid stenosis, non-insulin-dependent diabetes mellitus type 2, PAD, GERD, mild COPD, history of tobacco use, hyperlipidemia, hypertension, who presents emergency department for chief concerns of facial droop, expressive aphasia, and R sided weakness  PAIN:  Are you having pain? Yes: NPRS scale: 6/10 Pain location: B shoulders  PRECAUTIONS: Fall  WEIGHT BEARING RESTRICTIONS No  FALLS: Has patient fallen in last 6 months? Yes. Number of falls 1  LIVING ENVIRONMENT: Lives with: lives with their family and lived alone prior to onset  Lives in: House/apartment Stairs: Yes: External: 4 steps; can reach both Has following equipment at home: Walker - 2 wheeled and shower chair  PLOF: Independent  PATIENT GOALS become independent with everyday activities like  he was prior to the onset of the stroke   OBJECTIVE:   DIAGNOSTIC FINDINGS: 1. 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 No associated hemorrhage or significant regional mass effect.  2. Loss of normal flow voids throughout the left ICA and MCA,  consistent with slow flow and/or occlusion. Susceptibility artifact  within proximal left MCA branches consistent with intraluminal  thrombus.  3. Underlying mild chronic microvascular ischemic disease. Tiny  remote left ACA distribution infarct.     MRA HEAD IMPRESSION:   "1. Interval near complete occlusion of the left ICA and MCA,  presumably acute in nature given the presence of the acute left MCA  territory infarct. Left A1 segment not well seen either, also likely  occluded.  2. Otherwise wide patency of the major intracranial arterial  circulation. No other hemodynamically significant or correctable  stenosis.     MRA NECK IMPRESSION:  1. 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 Some attenuated flow seen  distally within the cervical left ICA, with subsequent reocclusion  by the skull base.  2. 50% atheromatous stenosis at the origin of the cervical right  ICA. Otherwise wide patency of the right carotid artery system.  3. Wide patency of the vertebral arteries within the neck.".    COGNITION: Overall cognitive status: Within functional limits for tasks assessed and subjective history limited secondary to aphasia but able to answer 1 word questions appropriately.    SENSATION: WFL  COORDINATION: WNL with heel to shin testing    LOWER EXTREMITY ROM:   AROM WNL    LOWER EXTREMITY MMT:    MMT Right Eval Left Eval  Hip flexion 4 4+  Hip extension    Hip abduction 4 5  Hip adduction 4 5  Hip internal rotation    Hip external rotation    Knee flexion 4 5  Knee extension 4 5  Ankle dorsiflexion 4+ 5  Ankle plantarflexion 4+ 5  Ankle inversion 5 5   Ankle eversion 5 5  (Blank rows = not tested)   TRANSFERS: Assistive device utilized: Environmental consultant - 2 wheeled  Sit to stand: Modified independence Stand to sit: Complete Independence Chair to chair:  SBA  STAIRS:  Level of Assistance: SBA Stair Negotiation Technique: Step to Pattern with Bilateral Rails Number of Stairs: 4  Height of Stairs: 6in  Comments: Right lower extremity foot catches when a sending steps and patient performs descending steps utilizing right lower extremity for more support.  Patient reports he knows he is doing this "the hard way"  GAIT: Gait pattern: decreased arm swing- Right and decreased step length- Right Distance walked: 10 M Assistive device utilized: Environmental consultant - 2 wheeled and None Level of assistance: Modified independence Comments: difficulty with turns and object navigation per DGI tesitng, able to walk without AD but only in controlled environment without obstacles or significant challenge  FUNCTIONAL TESTs:  5 times sit to stand: 16.95 sec no UE assist 10 meter walk test: .76 m/s without AD  Berg Balance Scale: 44/56 DGI:10  PATIENT SURVEYS:  FOTO to retest next session, likely with either asking patient questions or having caregiver complete patient completed but had 97% indicating he could run and jump with these and when questioned about this following patient reports he cannot run or jump with these and would benefit from filling this out again next time.  12/04/2021: 49 (goal 62)    TODAY'S TREATMENT:  Gait belt donned, CGA provided unless noted otherwise  NMR: Seated BP prior to session 138/91 mmHg HR 91 bpm  Obstacle course: navigating through and around cones  -progressed to performing with dual motor task  -pt then performed with stepping over obstacle (half-foam) with and without dual motor task Comments: knocks over cones, difficulty with obstacle clearance  Orange hurdle FWD/BCKWD stepping x multiple reps. Very  challenging Modified to blue half-foam FWD/BCKWD stepping - still very challenging. Errors increase with fatigue  TherEx:   2 rounds- 5lb ankle weights:  -LAQ 12x each LE Seated march 12x each LE Standing hamstring curl 12x each LE  Standing hip ext 2x12 each LE Standing hip abd 2x12 each LE Comments: cuing throughout to decrease speed of movement. Pt with difficulty correcting.  PATIENT EDUCATION: Education details: Pt educated throughout session about proper posture and technique with exercises. Improved exercise technique, movement at target joints, use of target muscles after min to mod verbal, visual, tactile cues.   Person educated: Patient Education method: Explanation, Demonstration, Tactile cues, and Verbal cues Education comprehension: verbalized understanding, returned demonstration, verbal cues required, tactile cues required, and needs further education   HOME EXERCISE PROGRAM:  No updates today, pt to continue as previously given  9/11: Pt to continue HEP as previously given. However, PT did provide education to use a chair with arm-rests for lateral support when performing STS   Access Code: [email or web address] URL: [email or web address] Date: 11/24/2021 Prepared by: Rivka Barbara  Exercises - Side Stepping with Counter Support  - 1 x daily - 7 x weekly - 2 sets - 10 reps - Standing Tandem Balance with Counter Support  - 1 x daily - 7 x weekly - 3 sets - 30 second hold - Standing March with Counter Support  - 1 x daily - 7 x weekly - 3 sets - 10 reps - Sit to Stand Without Arm Support  - 1 x daily - 7 x weekly - 2 sets - 10 reps    GOALS: Goals reviewed with patient? Yes  SHORT TERM GOALS: Target date: 12/22/2021   Patient will be independent in home exercise program to improve strength/mobility for better functional independence with ADLs. Baseline: No HEP currently  Goal status: INITIAL  LONG TERM GOALS: Target date: 02/16/2022   1.   Patient (> 32 years old) will complete five times sit to stand test in < 15 seconds indicating an increased LE strength and improved balance. Baseline: 16.95 Goal status: INITIAL  2.  Patient will increase FOTO score to equal to or greater than  60   to demonstrate statistically significant improvement in mobility and quality of life.  Baseline: Test neck session Goal status: INITIAL   3.  Patient will increase Berg Balance score by > 6 points to demonstrate decreased fall risk during functional activities. Baseline: 44 Goal status: INITIAL   4.   Patient will improve DGI by 5 points or more to reduce fall risk and demonstrate improved gait ability. Baseline: 10 Goal status: INITIAL  5.   Patient will increase 10 meter walk test to >1.52ms as to improve gait speed for better community ambulation and to reduce fall risk. Baseline: .774m Goal status: INITIAL   ASSESSMENT:  CLINICAL IMPRESSION: Initiated obstacle course interventions without AD today. Throughout intervention pt exhibited mild unsteadiness navigating cones, knocked them over, and did have difficulty with obstacle clearance. Pt did show some progress by completing more reps of therex. However, he required frequent cues to decrease speed of movement throughout. Pt will continue to benefit from skilled therapy to address remaining deficits in order to improve overall QoL and return to PLOF.     OBJECTIVE IMPAIRMENTS Abnormal gait, cardiopulmonary status limiting activity, decreased activity tolerance, decreased balance, decreased endurance, decreased mobility, difficulty walking, decreased strength, and decreased safety awareness.   ACTIVITY LIMITATIONS lifting, bending, standing, squatting, stairs, transfers, and locomotion level  PARTICIPATION LIMITATIONS: meal prep, shopping, and community activity  PERSONAL FACTORS Age and 3+ comorbidities: left-sided carotid stenosis, non-insulin-dependent diabetes mellitus type 2,  PAD, GERD, mild COPD, history of tobacco use, hyperlipidemia, hypertension  are also affecting patient's functional outcome.   REHAB POTENTIAL: Good  CLINICAL DECISION MAKING: Evolving/moderate complexity  EVALUATION COMPLEXITY: Moderate  PLAN: PT FREQUENCY: 2x/week  PT DURATION: 12 weeks  PLANNED INTERVENTIONS: Therapeutic exercises, Therapeutic activity, Neuromuscular re-education, Balance training, Gait training, Patient/Family education, Self Care, Joint mobilization, Stair training, Moist heat, and Manual therapy  PLAN FOR NEXT SESSION: begin POC, focus on R LE NMR to improve proprioception and coordination, obstacle clearance, dual task, continue plan HaZollie PeePT 12/20/2021, 3:23 PM

## 2021-12-20 NOTE — Therapy (Signed)
OUTPATIENT SPEECH LANGUAGE PATHOLOGY TREATMENT    Patient Name: Gerald Bauer MRN: [id number] DOB:1945/08/22, 76 y.o., male Today's Date: 12/20/2021  PCP: [name], PA-C REFERRING PROVIDER: Reesa Chew, PA-C   End of Session - 12/20/21 1444     Visit Number 11    Number of Visits 25    Date for SLP Re-Evaluation 02/04/22    SLP Start Time 0900    SLP Stop Time  1000    SLP Time Calculation (min) 60 min    Activity Tolerance Patient tolerated treatment well             Past Medical History:  Diagnosis Date   Acid reflux    Arthritis    hands   Carotid atherosclerosis    Diabetes mellitus without complication ([place])    Ganglion cyst 02/23/2015   Hyperlipemia    Hypertension    Joint pain in fingers of right hand 02/23/2015   Dominant hand   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 07/11/2015   Prostate cancer ([place])    history   Stroke (cerebrum) ([place]) 10/06/2021   Tachycardia    Tinea pedis of both feet 01/10/2017   Past Surgical History:  Procedure Laterality Date   [place]  2013   PROSTATECTOMY  2012   Patient Active Problem List   Diagnosis Date Noted   Hemiparesis, aphasia, and dysphagia as late effects of stroke ([place]) 11/07/2021   Lower urinary tract symptoms (LUTS) 11/07/2021   Myalgia due to statin 02/21/2021   Type 2 diabetes mellitus without complication, without long-term current use of insulin ([place]) 02/21/2021   Aortic atherosclerosis ([place]) 04/27/2020   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   Onychomycosis of multiple toenails with type 2 diabetes mellitus ([place]) 07/19/2016   Carotid stenosis 03/13/2016   Hyperlipidemia associated with type 2 diabetes mellitus ([place])    Hypertension associated with type 2 diabetes mellitus ([place])    Personal history of prostate cancer    Carotid atherosclerosis    GERD (gastroesophageal reflux disease) 08/20/2013    ONSET DATE:  10/05/2021   REFERRING DIAG: Cerebral infarction due to unspecified occlusion of left middle cerebral artery  THERAPY DIAG:  Aphasia  Verbal apraxia  Rationale for Evaluation and Treatment Rehabilitation  SUBJECTIVE:   SUBJECTIVE STATEMENT:  Pt reports walking in grandson's yard to inspect a bridge he built.  Pt accompanied by: family member daughter in Physicist, medical  PERTINENT HISTORY: Pt  is a 76 year old male with history of left-sided carotid stenosis, non-insulin-dependent diabetes mellitus type 2, PAD, GERD, mild COPD, history of tobacco use, hyperlipidemia, hypertension, who presented 10/05/21 to [facility] ED with facial droop, expressive aphasia, and R sided weakness and found to have left MCA CVA. He was admitted to [facility] 7/17-10/31/21. Advanced to dysphagia 3/thin liquids by cup by time of d/c from CIR. 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 Difficult to fully assess cognition given severity of linguistic impairment; however, does present with decreased safety awareness, diminished problem-solving, emergent awareness, and mild impulsivity with movement."   DIAGNOSTIC FINDINGS: MBS 10/19/21: "Pt presents with overall mild oropharyngeal dysphagia. Oral phase c/b piecemeal deglutition, decreased mastication, decreased bolus cohesion, premature spillage, and weak lingual manipulation. Pharyngeal phase c/b reduced pharyngeal peristalsis + reduced BOT approximation to PPW, which results in mild to moderate vallecular and pyriform sinuses residuals (vallecula >  pyriform sinuses for solids, pyriform sinuses > vallecula for liquids). No penetration nor aspiration appreciated with thin liquid via cup, Dysphagia 1 (puree), Dysphagia 2 (chopped), Dysphagia 3 (mechanical soft), or with 13 mm Barium tablet placed in puree. Once instance of sensed aspiration with intake of large bolus of thin liquid via straw  due to swallow initiation delay to pyriform sinuses and mistiming of swallow-breath cycle; despite strong reflexive cough aspirant was not fully cleared from trachea. Pt's level of swallow initiation was noted to vary with liquid consumption (over base of epiglottis and at pyriform sinuses) with level of swallow initiation for solids to be consistent at the vallecula. Pharyngeal stasis was partially cleared with reflexive secondary swallow." MRI brain 10/05/21: 1. 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 No associated hemorrhage or significant regional mass effect. 2. Loss of normal flow voids throughout the left ICA and MCA, consistent with slow flow and/or occlusion. 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 3. Underlying mild chronic microvascular ischemic disease. Tiny remote left ACA distribution infarct.   PAIN:  Are you having pain? No Pain location: left shoulder   PATIENT GOALS talk better  OBJECTIVE:   TODAY'S TREATMENT:  SLP facilitated simple conversation re: pt's activities since last ST session. With mod cues (slower rate, use of spelling/writing, tapping hand to segment words/syllables) and supported conversation, pt shared details about a bridge his grandson built. Pt answered questions re: vacations, hobbies and interests, with frequent clarification necessary (to establish shared topic), and then used tapping/imitation to phrase response for intelligibility 70% (<50% without strategies).     PATIENT EDUCATION: Education details: see today's treatment for details Person educated: Patient Education method: Explanation, Demonstration, and written supports Education comprehension: verbalized understanding and needs further education   GOALS: Goals reviewed with patient? Yes  SHORT TERM GOALS: Target date: 10 sessions  Patient will participate in clinical assessment of swallow function with  goals added as needed. Baseline: Goal status: MET  2.  Pt will communicate emergency information 100% accuracy using visual aid if necessary.  Baseline:  Goal status: IN PROGRESS (partially met, will continue; 70% intelligible with mod-max cues)  3.  Pt will approximate personally relevant words and phrases >80% accuracy using script training and min visual cues for apraxia.  Baseline:  Goal status: MET  4.  Pt will generate at least 4 descriptors of target word 80% of the time using semantic feature analysis to improve abilities in wordfinding and resolving communication breakdowns.  Baseline:  Goal status: MET  5.  Pt will complete HEP for dysphagia with rare min A. Baseline:  Goal status: MET  6.  Pt will generate sentences using personally relevant words list for >80% intelligibility. Baseline: 50% intelligibility Goal status: INITIAL  5.  Pt will improve expressive language skills by taking 4-6 turns in simple conversation, with supported conversation strategies if needed. Baseline:  Goal status: INITIAL    LONG TERM GOALS: Target date: 02/04/2022  Pt will engage in 5-8 minutes simple-mod complex conversation re: topic of interest with supported conversation, aphasia compensations.  Baseline:  Goal status: IN PROGRESS  2.  Pt will ID and attempt repair of communication breakdowns >90% of the time in session.   Baseline:  Goal status: IN PROGRESS  3.  Pt/family will demonstrate knowledge of community resources and activities to support language/communication. Baseline:  Goal status: IN PROGRESS  4.  Pt will demonstrate use of swallowing precautions independently with s/sx aspiration <  5% of trials. 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 Pt beginning to initiate repair of breakdowns with alphabet board, drawing, and description, although extended time and mod cues required. Pt approximating personally relevant vocabulary (1-3 syllable) 90% acc.  Patient demonstrates good use of nonverbals and Y/N responses, however is significantly limited in his ability to relay thoughts, feelings, and non-contextual or abstract information. Pt appears motivated and is demonstrating excellent progress toward goals. I recommend skilled ST to improve pt's swallowing, language and motor speech skills to increase swallowing safety, improve communication and reduce frustration.   OBJECTIVE IMPAIRMENTS include expressive language, receptive language, aphasia, apraxia, dysarthria, and dysphagia. These impairments are limiting patient from managing medications, managing appointments, managing finances, household responsibilities, ADLs/IADLs, effectively communicating at home and in community, and safety when swallowing. Factors affecting potential to achieve goals and functional outcome are cooperation/participation level; pt appears highly motivated with good family support. Patient will benefit from skilled SLP services to address above impairments and improve overall function.  REHAB POTENTIAL: Excellent  PLAN: SLP FREQUENCY: 2x/week  SLP DURATION: 12 weeks  PLANNED INTERVENTIONS: Aspiration precaution training, Pharyngeal strengthening exercises, Diet toleration management , Language facilitation, Environmental controls, Trials of upgraded texture/liquids, Cognitive reorganization, Internal/external aids, Functional tasks, Multimodal communication approach, SLP instruction and feedback, Compensatory strategies, and Patient/family education    [name], MS, Actor [phone]

## 2021-12-21 NOTE — Therapy (Signed)
OUTPATIENT OCCUPATIONAL THERAPY NEURO TREATMENT  Patient Name: Gerald Bauer MRN: [id number] DOB:02-09-1946, 76 y.o., male Today's Date: 12/21/2021  PCP: Threasa Alpha, PA REFERRING PROVIDER: Reesa Chew   OT End of Session - 12/21/21 0958     Visit Number 13    Number of Visits 24    Date for OT Re-Evaluation 01/29/22    Authorization Time Period Reporting period beginning 12/11/21    OT Start Time 51    OT Stop Time 1145    OT Time Calculation (min) 45 min    Activity Tolerance Patient tolerated treatment well    Behavior During Therapy [facility] for tasks assessed/performed            Past Medical History:  Diagnosis Date   Acid reflux    Arthritis    hands   Carotid atherosclerosis    Diabetes mellitus without complication ([place])    Ganglion cyst 02/23/2015   Hyperlipemia    Hypertension    Joint pain in fingers of right hand 02/23/2015   Dominant hand   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 07/11/2015   Prostate cancer ([place])    history   Stroke (cerebrum) ([place]) 10/06/2021   Tachycardia    Tinea pedis of both feet 01/10/2017   Past Surgical History:  Procedure Laterality Date   [place]  2013   PROSTATECTOMY  2012   Patient Active Problem List   Diagnosis Date Noted   Hemiparesis, aphasia, and dysphagia as late effects of stroke ([place]) 11/07/2021   Lower urinary tract symptoms (LUTS) 11/07/2021   Myalgia due to statin 02/21/2021   Type 2 diabetes mellitus without complication, without long-term current use of insulin ([place]) 02/21/2021   Aortic atherosclerosis ([place]) 04/27/2020   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   Onychomycosis of multiple toenails with type 2 diabetes mellitus ([place]) 07/19/2016   Carotid stenosis 03/13/2016   Hyperlipidemia associated with type 2 diabetes mellitus ([place])    Hypertension associated with type 2 diabetes mellitus ([place])    Personal history of prostate  cancer    Carotid atherosclerosis    GERD (gastroesophageal reflux disease) 08/20/2013    ONSET DATE: 10/05/21  REFERRING DIAG: L MCA CVA  THERAPY DIAG:  Muscle weakness (generalized)  Other lack of coordination  Left middle cerebral artery stroke ([place])  Rationale for Evaluation and Treatment Rehabilitation  SUBJECTIVE:    SUBJECTIVE STATEMENT:  Pt reporting high pain level in R shoulder this date.  Provided pt with moist heat throughout OT session for R shoulder pain.  PERTINENT HISTORY: Per chart, Gerald Bauer is a 76 year old right-handed male with history of hypertension, hyperlipidemia, diabetes mellitus, prostate cancer/prostatectomy 2012, quit smoking 24 years ago.  Presented to [facility] 10/05/2021 with right side weakness/facial droop and expressive aphasia.  Blood pressure 155/102.  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  No associated hemorrhage or significant mass effect.    PAIN:  Are you having pain? Yes: Pain location:  R shoulder 9/10 pain Pain description: Arthritic Pain Aggravating factors: movement  Relieving factors: rest, tylenol, heat  FALLS: Has patient fallen in last 6 months? Yes. Number of falls 3 since CVA  PLOF: Independent/retired Dealer  PATIENT GOALS : Pt points to his hand (wants to be able to use his hand)  OBJECTIVE:   HAND DOMINANCE: Right   FUNCTIONAL OUTCOME MEASURES: FOTO: 48  UUPPER EXTREMITY ROM  Active ROM Right eval Left Eval WNL Right 12/11/21  Shoulder flexion 90 (135)   95 (110)  Shoulder abduction 95 (110)   80 (90)  Wrist flex     54  Wrist ext     41    UPPER EXTREMITY MMT:      MMT Right eval Left Eval 5/5 Right 12/11/21  Shoulder flexion 3-   -3  Shoulder abduction 3-   -3  Shoulder adduction        Shoulder extension        Shoulder internal rotation 3-      Shoulder external rotation 3-      Middle trapezius        Lower trapezius        Elbow  flexion 4-   4  Elbow extension 4+   5  Wrist flexion 4   NT d/t pain  Wrist extension 4-   NT d/t pain  (Blank rows = not tested)   HAND FUNCTION: Eval:         Grip strength: Right: 14 lbs; Left: 58 lbs, Lateral pinch: Right: 7 lbs, Left: 19 lbs, and 3 point pinch: Right: 2 lbs, Left: 16 lbs 10th visit: Grip strength: Right: 16  lbs;                                Lateral pinch: Right: 9  lbs;                            3 point pinch: Right: 5 lbs   Eval: COORDINATION: Finger Nose Finger test: difficult/lacks precision with reaching targets 9 Hole Peg test: Right: unable sec; Left: 27 sec Able to oppose 2nd and 3rd digits to thumb on R hand   10th visit 9 hole Peg test: Right: 11 min, 44 sec    TODAY'S TREATMENT:   Therapeutic Exercise: Performed active finger opposition, digit flex/ext, and digit abd/add x10 each.  Facilitated R grip strengthening with light resistance hand gripper with 1 red band for 3 sets 10 reps; assist for set up of gripper in hand.  Patient worked on right hand lateral and three-point pinch strengthening with resistive clothespins.  Patient required cues to avoid using the left hand to position the clips. Pt. performed resistive EZ Board exercises for forearm supination/pronation, wrist flexion/extension using gross grasp, and lateral pinch (key) grasp.  Pt required intermittent min A to keep tools level and mod vc for technique to maximize rotation of tools using larger ROM arcs.  Performed passive stretching for R wrist and digit flex/extension with good tolerance.    PATIENT EDUCATION: Education details: HEP progression Person educated: pt Education method: Explanation and Verbal cues, handout Education comprehension: verbalized understanding, returned demo   HOME EXERCISE PROGRAM: theraputty  GOALS: Goals reviewed with patient? Yes   SHORT TERM GOALS: Target date: 12/18/21   Pt will be indep to perform HEP for increasing strength and coordination  throughout RUE.  Baseline: Not yet initiated; 10th: putty given but pt reports limited use, instructed in table slides and self PROM for R wrist/digits.   Goal status: ongoing   2.  Pt will manage clothing fasteners with extra time, using RUE as an assist  Baseline: unable; pt has elastic laces and wears elastic waisted pants; 10th: pt uses R hand as an assist to zip and button jeans with extra  time; not yet tried small buttons on a shirt.  Pt continues to use elastic laces on shoes. Goal status: ongoing   LONG TERM GOALS: Target date: 01/29/22   Pt will increase FOTO score to 55 or better to indicate improved performance with daily tasks.  Baseline: 48; 10th: 48 Goal status: ongoing   2.  Pt will increase R grip strength by 10 or more lbs to enable pt to hold and carry light ADL supplies without dropping.  Baseline: R grip 14#, L 58#; 10th visit: R grip 16 Goal status: INITIAL   3.  Pt will increase RUE strength to be able to engage RUE into ADLs at least 50% of the time. Baseline: Pt using L non-dominant arm to manage ADLs; 10th visit: Son reports he constantly sees pt attempt to use his R arm for daily tasks, but continues to be limited and still must use LUE for most tasks (see chart for RUE MMT) Goal status: ongoing   4.  Pt will complete 9 hole peg test on the R in 3 min or less to work towards ability to use R hand to pick up small ADL supplies.  Baseline: Pt can remove a peg but can not pick one up; 10th visit: R 9 hole completion 11 min 44 sec Goal status: ongoing    ASSESSMENT:  CLINICAL IMPRESSION:   Pt reports 9/10 pain in R shoulder this date.  Pt tolerated moist heat throughout session to R shoulder while performing therapeutic exercises focussed on R forearm, wrist, and hand strength and coordination.  Pt. is improving with right lateral pinch strength. Pt. was able able to accurately sustain lateral pinch on the green resistive clips in preparation for placing them onto  a small horizontal dowel, but required vc to avoid using L hand to position clips in R hand.  When working with [employer], pt required intermittent min A to keep tools level and mod vc for technique to maximize rotation of tools using larger ROM arcs.  Patient continues to work on right UE strength, grip strength, pinch strength, and fine motor coordination skills in order to improve RUE functioning, and improve engagement in, and maximize independence with ADLs and IADL tasks.  PERFORMANCE DEFICITS in functional skills including ADLs, IADLs, coordination, dexterity, ROM, strength, pain, flexibility, FMC, GMC, mobility, balance, continence, decreased knowledge of use of DME, and UE functional use, cognitive skills including safety awareness, and psychosocial skills including.   IMPAIRMENTS are limiting patient from ADLs, IADLs, leisure, and social participation.   COMORBIDITIES may have co-morbidities  that affects occupational performance. Patient will benefit from skilled OT to address above impairments and improve overall function.  MODIFICATION OR ASSISTANCE TO COMPLETE EVALUATION: Min-Moderate modification of tasks or assist with assess necessary to complete an evaluation.  OT OCCUPATIONAL PROFILE AND HISTORY: Problem focused assessment: Including review of records relating to presenting problem.  CLINICAL DECISION MAKING: Moderate - several treatment options, min-mod task modification necessary  REHAB POTENTIAL: Good  EVALUATION COMPLEXITY: Moderate    PLAN: OT FREQUENCY: 2x/week  OT DURATION: 12 weeks  PLANNED INTERVENTIONS: self care/ADL training, therapeutic exercise, therapeutic activity, neuromuscular re-education, manual therapy, passive range of motion, balance training, functional mobility training, moist heat, cryotherapy, patient/family education, cognitive remediation/compensation, energy conservation, coping strategies training, and DME and/or AE  instructions  RECOMMENDED OTHER SERVICES: N/A  CONSULTED AND AGREED WITH PLAN OF CARE: Patient and family member/caregiver  PLAN FOR NEXT SESSION: HEP progression, neuro re-ed, therapeutic exercises  Izora Gala  Dimitrious Micciche, MS, OTR/L  Darleene Cleaver, OT 12/21/2021, 10:04 AM

## 2021-12-26 ENCOUNTER — Ambulatory Visit: Payer: Medicare Other

## 2021-12-26 ENCOUNTER — Ambulatory Visit: Payer: Medicare Other | Attending: Physical Medicine and Rehabilitation | Admitting: Speech Pathology

## 2021-12-26 DIAGNOSIS — R482 Apraxia: Secondary | ICD-10-CM | POA: Diagnosis present

## 2021-12-26 DIAGNOSIS — R2681 Unsteadiness on feet: Secondary | ICD-10-CM | POA: Insufficient documentation

## 2021-12-26 DIAGNOSIS — I63512 Cerebral infarction due to unspecified occlusion or stenosis of left middle cerebral artery: Secondary | ICD-10-CM

## 2021-12-26 DIAGNOSIS — R4701 Aphasia: Secondary | ICD-10-CM | POA: Insufficient documentation

## 2021-12-26 DIAGNOSIS — R41841 Cognitive communication deficit: Secondary | ICD-10-CM | POA: Insufficient documentation

## 2021-12-26 DIAGNOSIS — R269 Unspecified abnormalities of gait and mobility: Secondary | ICD-10-CM

## 2021-12-26 DIAGNOSIS — M6281 Muscle weakness (generalized): Secondary | ICD-10-CM

## 2021-12-26 DIAGNOSIS — R278 Other lack of coordination: Secondary | ICD-10-CM

## 2021-12-26 DIAGNOSIS — R262 Difficulty in walking, not elsewhere classified: Secondary | ICD-10-CM

## 2021-12-26 DIAGNOSIS — R1312 Dysphagia, oropharyngeal phase: Secondary | ICD-10-CM | POA: Insufficient documentation

## 2021-12-26 NOTE — Therapy (Signed)
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 07/11/2015   Prostate cancer ([place])    history   Stroke (cerebrum) ([place]) 10/06/2021   Tachycardia    Tinea pedis of both feet 01/10/2017   Past Surgical History:  Procedure Laterality Date   [place]  2013   PROSTATECTOMY  2012   Patient Active Problem List   Diagnosis Date Noted   Hemiparesis, aphasia, and dysphagia as late effects of stroke ([place]) 11/07/2021   Lower urinary tract symptoms (LUTS) 11/07/2021   Myalgia due to statin 02/21/2021   Type 2 diabetes mellitus without complication, without long-term current use of insulin ([place]) 02/21/2021   Aortic atherosclerosis ([place]) 04/27/2020   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  Rationale for Evaluation and Treatment Rehabilitation  SUBJECTIVE:    SUBJECTIVE STATEMENT:  Pt reporting high pain level in R shoulder this date.  Provided pt with moist heat throughout OT session for R  shoulder pain.  PERTINENT HISTORY: Per chart, Gerald Bauer is a 76 year old right-handed male with history of hypertension, hyperlipidemia, diabetes mellitus, prostate cancer/prostatectomy 2012, quit smoking 24 years ago.  Presented to [facility] 10/05/2021 with right side weakness/facial droop and expressive aphasia.  Blood pressure 155/102.  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  No associated hemorrhage or significant mass effect.    PAIN:  Are you having pain? Yes: Pain location:  R shoulder 5/10 pain, 3/10 pain R wrist Pain description: Arthritic Pain Aggravating factors: movement  Relieving factors: rest, tylenol, heat  FALLS: Has patient fallen in last 6 months? Yes. Number of falls 3 since CVA  PLOF: Independent/retired Dealer  PATIENT GOALS : Pt points to his hand (wants to be able to use his hand)  OBJECTIVE:   HAND DOMINANCE: Right   FUNCTIONAL OUTCOME MEASURES: FOTO: 48  UUPPER EXTREMITY ROM      Active ROM Right eval Left Eval WNL Right 12/11/21  Shoulder flexion 90 (135)   95 (110)  Shoulder abduction 95 (110)   80 (90)  Wrist flex     54  Wrist ext     41    UPPER EXTREMITY MMT:      MMT Right eval Left Eval 5/5 Right 12/11/21  Shoulder flexion 3-   -3  Shoulder abduction 3-   -3  Shoulder adduction        Shoulder extension  Shoulder internal rotation 3-      Shoulder external rotation 3-      Middle trapezius        Lower trapezius        Elbow flexion 4-   4  Elbow extension 4+   5  Wrist flexion 4   NT d/t pain  Wrist extension 4-   NT d/t pain  (Blank rows = not tested)   HAND FUNCTION: Eval:         Grip strength: Right: 14 lbs; Left: 58 lbs, Lateral pinch: Right: 7 lbs, Left: 19 lbs, and 3 point pinch: Right: 2 lbs, Left: 16 lbs 10th visit: Grip strength: Right: 16  lbs;                                Lateral pinch: Right: 9  lbs;                            3 point pinch: Right: 5 lbs    Eval: COORDINATION: Finger Nose Finger test: difficult/lacks precision with reaching targets 9 Hole Peg test: Right: unable sec; Left: 27 sec Able to oppose 2nd and 3rd digits to thumb on R hand   10th visit 9 hole Peg test: Right: 11 min, 44 sec    TODAY'S TREATMENT:   Therapeutic Exercise: Performed active digit flex/ext, with therapist following for passive ROM to meet end range d/t stiffness x2 sets 10 reps each.  Completed active digit abd/add x10 each, OT providing min A to achieve max end range.  Completed single digit taps for 10 each hand, tactile and vc for working to tap with larger lift on a slow beat.  Facilitated hand strengthening with use of hand gripper set at 6.6# to place/remove jumbo pegs from pegboard x2 trials using R hand; verbal and visual cues for vertical positioning of hand gripper to reduce # of dropped pegs.  Self Care: Practiced fork and table knife skills with built up foam grip.  Pt used fork to pick up putty pieces and simulated bringing fork to mouth.  Pt required occasional set up assist of fork in the middle of a putty piece, and vc for angling fork with sufficient pressure for successful pick up of "food" piece.  Practiced using table knife to cut thin pieces of putty in half, L hand using fork to stabilize putty.  Practiced using R hand to sip from small paper cup of water (4 oz).  Initial mod A, hand over hand assist to grasp, bring cup to mouth, and back down to table without spill.  Pt progressed to 2 hands independently, then R hand with min A, and R hand with therapist providing close supv to guard cup from dropping.  Able to complete without spilling.  Encouraged pt obtain light weight cup with handle, lid, straw to begin using R hand to sip from a cup without fear of spilling.  Encouraged pt use 2 hands to sip until cup with lid and handle can be obtained.  Pt verbalized understanding/receptive to recommendations.    PATIENT EDUCATION: Education  details: HEP progression Person educated: pt Education method: Explanation and Verbal cues, handout Education comprehension: verbalized understanding, returned demo   HOME EXERCISE PROGRAM: theraputty  GOALS: Goals reviewed with patient? Yes   SHORT TERM GOALS: Target date: 12/18/21   Pt will be indep to perform HEP for  increasing strength and coordination throughout RUE.  Baseline: Not yet initiated; 10th: putty given but pt reports limited use, instructed in table slides and self PROM for R wrist/digits.   Goal status: ongoing   2.  Pt will manage clothing fasteners with extra time, using RUE as an assist  Baseline: unable; pt has elastic laces and wears elastic waisted pants; 10th: pt uses R hand as an assist to zip and button jeans with extra time; not yet tried small buttons on a shirt.  Pt continues to use elastic laces on shoes. Goal status: ongoing   LONG TERM GOALS: Target date: 01/29/22   Pt will increase FOTO score to 55 or better to indicate improved performance with daily tasks.  Baseline: 48; 10th: 48 Goal status: ongoing   2.  Pt will increase R grip strength by 10 or more lbs to enable pt to hold and carry light ADL supplies without dropping.  Baseline: R grip 14#, L 58#; 10th visit: R grip 16 Goal status: INITIAL   3.  Pt will increase RUE strength to be able to engage RUE into ADLs at least 50% of the time. Baseline: Pt using L non-dominant arm to manage ADLs; 10th visit: Son reports he constantly sees pt attempt to use his R arm for daily tasks, but continues to be limited and still must use LUE for most tasks (see chart for RUE MMT) Goal status: ongoing   4.  Pt will complete 9 hole peg test on the R in 3 min or less to work towards ability to use R hand to pick up small ADL supplies.  Baseline: Pt can remove a peg but can not pick one up; 10th visit: R 9 hole completion 11 min 44 sec Goal status: ongoing    ASSESSMENT:  CLINICAL IMPRESSION:   Pt reports  5/10 pain in R shoulder this date, 3/10 in R wrist.  Pt tolerated moist heat throughout session to R shoulder while performing therapeutic exercises focussed on R wrist and hand strength and coordination.  Pt did well to practice fork and knife skills this date, using built up utensils to pick up putty pieces in R hand and cut a thin strip of putty in half with knife in R hand and fork in the L.  Practiced using R hand to sip from small paper cup of water (4 oz).  Initial mod A, hand over hand assist to grasp, bring cup to mouth, and back down to table without spill.  Pt progressed to 2 hands independently, then R hand with min A, and R hand with therapist providing close supv to guard cup from dropping.  Able to complete without spilling.  Encouraged pt obtain light weight cup with handle, lid, straw to begin using R hand to sip from a cup without fear of spilling at home.  Encouraged pt use 2 hands to sip until cup with lid and handle can be obtained.  Pt verbalized understanding/receptive to recommendations.   Patient continues to work on right UE strength, grip strength, pinch strength, and fine motor coordination skills in order to improve RUE functioning, and improve engagement in, and maximize independence with ADLs and IADL tasks.  PERFORMANCE DEFICITS in functional skills including ADLs, IADLs, coordination, dexterity, ROM, strength, pain, flexibility, FMC, GMC, mobility, balance, continence, decreased knowledge of use of DME, and UE functional use, cognitive skills including safety awareness, and psychosocial skills including.   IMPAIRMENTS are limiting patient from ADLs, IADLs, leisure, and social participation.  COMORBIDITIES may have co-morbidities  that affects occupational performance. Patient will benefit from skilled OT to address above impairments and improve overall function.  MODIFICATION OR ASSISTANCE TO COMPLETE EVALUATION: Min-Moderate modification of tasks or assist with assess  necessary to complete an evaluation.  OT OCCUPATIONAL PROFILE AND HISTORY: Problem focused assessment: Including review of records relating to presenting problem.  CLINICAL DECISION MAKING: Moderate - several treatment options, min-mod task modification necessary  REHAB POTENTIAL: Good  EVALUATION COMPLEXITY: Moderate    PLAN: OT FREQUENCY: 2x/week  OT DURATION: 12 weeks  PLANNED INTERVENTIONS: self care/ADL training, therapeutic exercise, therapeutic activity, neuromuscular re-education, manual therapy, passive range of motion, balance training, functional mobility training, moist heat, cryotherapy, patient/family education, cognitive remediation/compensation, energy conservation, coping strategies training, and DME and/or AE instructions  RECOMMENDED OTHER SERVICES: N/A  CONSULTED AND AGREED WITH PLAN OF CARE: Patient and family member/caregiver  PLAN FOR NEXT SESSION: HEP progression, neuro re-ed, therapeutic exercises  Leta Speller, MS, OTR/L  Darleene Cleaver, OT 12/26/2021, 10:12 AM

## 2021-12-26 NOTE — Therapy (Signed)
OUTPATIENT SPEECH LANGUAGE PATHOLOGY TREATMENT    Patient Name: Gerald Bauer MRN: [id number] DOB:April 18, 1945, 76 y.o., male Today's Date: 12/26/2021  PCP: [name], PA-C REFERRING PROVIDER: Meriam Sprague   End of Session - 12/26/21 1244     Visit Number 12    Number of Visits 25    Date for SLP Re-Evaluation 02/04/22    SLP Start Time 98    SLP Stop Time  1200    SLP Time Calculation (min) 60 min    Activity Tolerance Patient tolerated treatment well             Past Medical History:  Diagnosis Date   Acid reflux    Arthritis    hands   Carotid atherosclerosis    Diabetes mellitus without complication ([place])    Ganglion cyst 02/23/2015   Hyperlipemia    Hypertension    Joint pain in fingers of right hand 02/23/2015   Dominant hand   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 07/11/2015   Prostate cancer ([place])    history   Stroke (cerebrum) ([place]) 10/06/2021   Tachycardia    Tinea pedis of both feet 01/10/2017   Past Surgical History:  Procedure Laterality Date   [place]  2013   PROSTATECTOMY  2012   Patient Active Problem List   Diagnosis Date Noted   Hemiparesis, aphasia, and dysphagia as late effects of stroke ([place]) 11/07/2021   Lower urinary tract symptoms (LUTS) 11/07/2021   Myalgia due to statin 02/21/2021   Type 2 diabetes mellitus without complication, without long-term current use of insulin ([place]) 02/21/2021   Aortic atherosclerosis ([place]) 04/27/2020   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   Onychomycosis of multiple toenails with type 2 diabetes mellitus ([place]) 07/19/2016   Carotid stenosis 03/13/2016   Hyperlipidemia associated with type 2 diabetes mellitus ([place])    Hypertension associated with type 2 diabetes mellitus ([place])    Personal history of prostate cancer    Carotid atherosclerosis    GERD (gastroesophageal reflux disease) 08/20/2013    ONSET DATE:  10/05/2021   REFERRING DIAG: Cerebral infarction due to unspecified occlusion of left middle cerebral artery  THERAPY DIAG:  Verbal apraxia  Aphasia  Rationale for Evaluation and Treatment Rehabilitation  SUBJECTIVE:   SUBJECTIVE STATEMENT:  Patient had communication breakdown with OT during session transition.  Pt accompanied by: self  PERTINENT HISTORY: Pt  is a 76 year old male with history of left-sided carotid stenosis, non-insulin-dependent diabetes mellitus type 2, PAD, GERD, mild COPD, history of tobacco use, hyperlipidemia, hypertension, who presented 10/05/21 to [facility] ED with facial droop, expressive aphasia, and R sided weakness and found to have left MCA CVA. He was admitted to [facility] 7/17-10/31/21. Advanced to dysphagia 3/thin liquids by cup by time of d/c from CIR. 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 Difficult to fully assess cognition given severity of linguistic impairment; however, does present with decreased safety awareness, diminished problem-solving, emergent awareness, and mild impulsivity with movement."   DIAGNOSTIC FINDINGS: MBS 10/19/21: "Pt presents with overall mild oropharyngeal dysphagia. Oral phase c/b piecemeal deglutition, decreased mastication, decreased bolus cohesion, premature spillage, and weak lingual manipulation. Pharyngeal phase c/b reduced pharyngeal peristalsis + reduced BOT approximation to PPW, which results in mild to moderate vallecular and pyriform sinuses residuals (vallecula > pyriform sinuses for solids, pyriform sinuses >  vallecula for liquids). No penetration nor aspiration appreciated with thin liquid via cup, Dysphagia 1 (puree), Dysphagia 2 (chopped), Dysphagia 3 (mechanical soft), or with 13 mm Barium tablet placed in puree. Once instance of sensed aspiration with intake of large bolus of thin liquid via straw due to swallow initiation  delay to pyriform sinuses and mistiming of swallow-breath cycle; despite strong reflexive cough aspirant was not fully cleared from trachea. Pt's level of swallow initiation was noted to vary with liquid consumption (over base of epiglottis and at pyriform sinuses) with level of swallow initiation for solids to be consistent at the vallecula. Pharyngeal stasis was partially cleared with reflexive secondary swallow." MRI brain 10/05/21: 1. 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 No associated hemorrhage or significant regional mass effect. 2. Loss of normal flow voids throughout the left ICA and MCA, consistent with slow flow and/or occlusion. 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 3. Underlying mild chronic microvascular ischemic disease. Tiny remote left ACA distribution infarct.   PAIN:  Are you having pain? No Pain location: left shoulder   PATIENT GOALS talk better  OBJECTIVE:   TODAY'S TREATMENT:  SLP used supportive conversation, multimodal means to assist pt in relaying message he was trying to share with OT (re: putting security cameras up at his home and securing his property, since staying with his son currently). Session focused on using strategies for apraxia (slow rate, tapping and segmenting words) and aphasia in simple conversation. Pt answered personal questions re: spending time with his brother, what he misses about being alone, and snacks he likes. SLP used written keywords, and pt required usual mod cues to attempt slowing rate/segmenting, then writing to clarify when breakdowns occurred. Pt initiated use of phone to show photos to add context when discussing his RV and garden.    PATIENT EDUCATION: Education details: see today's treatment for details Person educated: Patient Education method: Explanation, Demonstration, and written supports Education comprehension: verbalized  understanding and needs further education   GOALS: Goals reviewed with patient? Yes  SHORT TERM GOALS: Target date: 10 sessions  Patient will participate in clinical assessment of swallow function with goals added as needed. Baseline: Goal status: MET  2.  Pt will communicate emergency information 100% accuracy using visual aid if necessary.  Baseline:  Goal status: IN PROGRESS (partially met, will continue; 70% intelligible with mod-max cues)  3.  Pt will approximate personally relevant words and phrases >80% accuracy using script training and min visual cues for apraxia.  Baseline:  Goal status: MET  4.  Pt will generate at least 4 descriptors of target word 80% of the time using semantic feature analysis to improve abilities in wordfinding and resolving communication breakdowns.  Baseline:  Goal status: MET  5.  Pt will complete HEP for dysphagia with rare min A. Baseline:  Goal status: MET  6.  Pt will generate sentences using personally relevant words list for >80% intelligibility. Baseline: 50% intelligibility Goal status: INITIAL  5.  Pt will improve expressive language skills by taking 4-6 turns in simple conversation, with supported conversation strategies if needed. Baseline:  Goal status: INITIAL    LONG TERM GOALS: Target date: 02/04/2022  Pt will engage in 5-8 minutes simple-mod complex conversation re: topic of interest with supported conversation, aphasia compensations.  Baseline:  Goal status: IN PROGRESS  2.  Pt will ID and attempt repair of communication breakdowns >90% of the time in session.   Baseline:  Goal status: IN PROGRESS  3.  Pt/family will demonstrate knowledge of community resources and activities to support language/communication. Baseline:  Goal status: IN PROGRESS  4.  Pt will demonstrate use of swallowing precautions independently with s/sx aspiration <5% of trials. Baseline:  Goal status: IN  PROGRESS   ASSESSMENT:  CLINICAL IMPRESSION: Patient presents with moderate Broca's aphasia, verbal apraxia, dysarthria (difficult to determine extent due to language deficits and apraxia), and mild oropharyngeal dysphagia. With encouragement, extended time and mod cues, pt able to repair of breakdowns using multimodal means including alphabet board, writing at word level (agrammatism present), drawing, and description, although extended time and mod cues required. Tapping hand while segmenting words/syllables highly effective to slow rate and improve productions. Pt approximating personally relevant vocabulary (1-3 syllable) 90% acc.  Patient demonstrates good use of nonverbals and Y/N responses, however is significantly limited in his ability to relay thoughts, feelings, and non-contextual or abstract information. Pt appears motivated and is demonstrating excellent progress toward goals. I recommend skilled ST to improve pt's swallowing, language and motor speech skills to increase swallowing safety, improve communication and reduce frustration.   OBJECTIVE IMPAIRMENTS include expressive language, receptive language, aphasia, apraxia, dysarthria, and dysphagia. These impairments are limiting patient from managing medications, managing appointments, managing finances, household responsibilities, ADLs/IADLs, effectively communicating at home and in community, and safety when swallowing. Factors affecting potential to achieve goals and functional outcome are cooperation/participation level; pt appears highly motivated with good family support. Patient will benefit from skilled SLP services to address above impairments and improve overall function.  REHAB POTENTIAL: Excellent  PLAN: SLP FREQUENCY: 2x/week  SLP DURATION: 12 weeks  PLANNED INTERVENTIONS: Aspiration precaution training, Pharyngeal strengthening exercises, Diet toleration management , Language facilitation, Environmental controls, Trials  of upgraded texture/liquids, Cognitive reorganization, Internal/external aids, Functional tasks, Multimodal communication approach, SLP instruction and feedback, Compensatory strategies, and Patient/family education    [name], MS, Actor [phone]

## 2021-12-26 NOTE — Therapy (Signed)
OUTPATIENT PHYSICAL THERAPY NEURO TREATMENT   Patient Name: Gerald Bauer MRN: [id number] DOB:December 19, 1945, 76 y.o., male Today's Date: 12/26/2021   PCP: [name], PA-C REFERRING PROVIDER: Bary Leriche, PA-C   PT End of Session - 12/26/21 0909     Visit Number 8    Number of Visits 24    Date for PT Re-Evaluation 02/16/22    Progress Note Due on Visit 10    PT Start Time 0915    PT Stop Time 1005    PT Time Calculation (min) 50 min    Equipment Utilized During Treatment Gait belt    Activity Tolerance Patient tolerated treatment well    Behavior During Therapy WFL for tasks assessed/performed                   Past Medical History:  Diagnosis Date   Acid reflux    Arthritis    hands   Carotid atherosclerosis    Diabetes mellitus without complication ([place])    Ganglion cyst 02/23/2015   Hyperlipemia    Hypertension    Joint pain in fingers of right hand 02/23/2015   Dominant hand   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 07/11/2015   Prostate cancer ([place])    history   Stroke (cerebrum) ([place]) 10/06/2021   Tachycardia    Tinea pedis of both feet 01/10/2017   Past Surgical History:  Procedure Laterality Date   [place]  2013   PROSTATECTOMY  2012   Patient Active Problem List   Diagnosis Date Noted   Hemiparesis, aphasia, and dysphagia as late effects of stroke ([place]) 11/07/2021   Lower urinary tract symptoms (LUTS) 11/07/2021   Myalgia due to statin 02/21/2021   Type 2 diabetes mellitus without complication, without long-term current use of insulin ([place]) 02/21/2021   Aortic atherosclerosis ([place]) 04/27/2020   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   Onychomycosis of multiple toenails with type 2 diabetes mellitus ([place]) 07/19/2016   Carotid stenosis 03/13/2016   Hyperlipidemia associated with type 2 diabetes mellitus ([place])    Hypertension associated with type 2 diabetes  mellitus ([place])    Personal history of prostate cancer    Carotid atherosclerosis    GERD (gastroesophageal reflux disease) 08/20/2013    ONSET DATE: 10/05/21  REFERRING DIAG: X52.841 (ICD-10-CM) - Cerebral infarction due to unspecified occlusion or stenosis of left middle cerebral artery  THERAPY DIAG:  Muscle weakness (generalized)  Other lack of coordination  Left middle cerebral artery stroke ([facility])  Unsteadiness on feet  Difficulty in walking, not elsewhere classified  Abnormality of gait and mobility  Rationale for Evaluation and Treatment Rehabilitation  SUBJECTIVE:  SUBJECTIVE STATEMENT: Pt continues to have mild bil shoulder pain but no wrist pain today. No reports of falls but a few stumbles. Continues to ambulate without AD in the home, but still uses RW for community mobility. Does not need walls/furniture to steady himself in the home.   Pt accompanied by: self  PAIN today:  Are you having pain? Yes: NPRS scale: 5/10 Pain location: bil shoulders, achey  PERTINENT HISTORY:Per H and P from 7/17 hospital d/c Pt  is a 76 year old male with history of left-sided carotid stenosis, non-insulin-dependent diabetes mellitus type 2, PAD, GERD, mild COPD, history of tobacco use, hyperlipidemia, hypertension, who presents emergency department for chief concerns of facial droop, expressive aphasia, and R sided weakness  PAIN:  Are you having pain? Yes: NPRS scale: 6/10 Pain location: B shoulders  PRECAUTIONS: Fall  WEIGHT BEARING RESTRICTIONS No  FALLS: Has patient fallen in last 6 months? Yes. Number of falls 1  LIVING ENVIRONMENT: Lives with: lives with their family and lived alone prior to onset  Lives in: House/apartment Stairs: Yes: External: 4 steps; can reach both Has  following equipment at home: Walker - 2 wheeled and shower chair  PLOF: Independent  PATIENT GOALS become independent with everyday activities like he was prior to the onset of the stroke   OBJECTIVE:   DIAGNOSTIC FINDINGS: 1. 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 No associated hemorrhage or significant regional mass effect.  2. Loss of normal flow voids throughout the left ICA and MCA,  consistent with slow flow and/or occlusion. Susceptibility artifact  within proximal left MCA branches consistent with intraluminal  thrombus.  3. Underlying mild chronic microvascular ischemic disease. Tiny  remote left ACA distribution infarct.     MRA HEAD IMPRESSION:   "1. Interval near complete occlusion of the left ICA and MCA,  presumably acute in nature given the presence of the acute left MCA  territory infarct. Left A1 segment not well seen either, also likely  occluded.  2. Otherwise wide patency of the major intracranial arterial  circulation. No other hemodynamically significant or correctable  stenosis.     MRA NECK IMPRESSION:  1. 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 Some attenuated flow seen  distally within the cervical left ICA, with subsequent reocclusion  by the skull base.  2. 50% atheromatous stenosis at the origin of the cervical right  ICA. Otherwise wide patency of the right carotid artery system.  3. Wide patency of the vertebral arteries within the neck.".    COGNITION: Overall cognitive status: Within functional limits for tasks assessed and subjective history limited secondary to aphasia but able to answer 1 word questions appropriately.    SENSATION: WFL  COORDINATION: WNL with heel to shin testing    LOWER EXTREMITY ROM:   AROM WNL    LOWER EXTREMITY MMT:    MMT Right Eval Left Eval  Hip flexion 4 4+  Hip extension    Hip abduction 4 5  Hip adduction 4 5  Hip internal  rotation    Hip external rotation    Knee flexion 4 5  Knee extension 4 5  Ankle dorsiflexion 4+ 5  Ankle plantarflexion 4+ 5  Ankle inversion 5 5  Ankle eversion 5 5  (Blank rows = not tested)   TRANSFERS: Assistive device utilized: Environmental consultant - 2 wheeled  Sit to stand: Modified independence Stand to sit: Complete Independence  Chair to chair: SBA  STAIRS:  Level of Assistance: SBA Stair Negotiation Technique: Step to Pattern with Bilateral Rails Number of Stairs: 4  Height of Stairs: 6in  Comments: Right lower extremity foot catches when a sending steps and patient performs descending steps utilizing right lower extremity for more support.  Patient reports he knows he is doing this "the hard way"  GAIT: Gait pattern: decreased arm swing- Right and decreased step length- Right Distance walked: 10 M Assistive device utilized: Environmental consultant - 2 wheeled and None Level of assistance: Modified independence Comments: difficulty with turns and object navigation per DGI tesitng, able to walk without AD but only in controlled environment without obstacles or significant challenge  FUNCTIONAL TESTs:  5 times sit to stand: 16.95 sec no UE assist 10 meter walk test: .76 m/s without AD  Berg Balance Scale: 44/56 DGI:10  PATIENT SURVEYS:  FOTO to retest next session, likely with either asking patient questions or having caregiver complete patient completed but had 97% indicating he could run and jump with these and when questioned about this following patient reports he cannot run or jump with these and would benefit from filling this out again next time.  12/04/2021: 49 (goal 62)    TODAY'S TREATMENT:  Gait belt donned, CGA provided unless noted otherwise  NMR: Seated BP prior to session 145/89 mmHg HR 91 bpm  Obstacle course: x4 reps  - NBOS with EC, x15 sec  - stepping over 1/2 foam roll alt leading with RLE vs. LLE  39f ambulation in hallway without AD:  - horizontal head turns,  x1 lap  - vertical head turns, x1 lap  - multidirectional head turns with reaction time/cues for specific directions  - side stepping with serial counting and ABC's x1 lap ea  Proprioceptive feedback from mirror:  - SLS with cone taps, cognitive load calling specific cone, x244m bil  - Anterior 4" step ups x10ea  - Lateral 4" step ups x10ea  - Forward/backward stepping over 1/2 foam foll, x10ea  Standing on airex pad:  - NBOS tic-tac-toe with RUE  - NBOS reaching outside BOS, matching colored letters to colored dots, then moving colored letters into same colored box (LUE) x2m57m - staggered stance with RLE on airex and LLE on ground, matching colored letters to colored dots, then moving colored letters into same colored box (LUE), x2mi45m PATIENT EDUCATION: Education details: Pt educated throughout session about proper posture and technique with exercises. Improved exercise technique, movement at target joints, use of target muscles after min to mod verbal, visual, tactile cues.   Person educated: Patient Education method: Explanation, Demonstration, Tactile cues, and Verbal cues Education comprehension: verbalized understanding, returned demonstration, verbal cues required, tactile cues required, and needs further education   HOME EXERCISE PROGRAM:  No updates today, pt to continue as previously given  9/11: Pt to continue HEP as previously given. However, PT did provide education to use a chair with arm-rests for lateral support when performing STS  10/3: added standing hip ABD/ext with UE support on counter  Access Code: [email or web address] URL: [email or web address] Date: 11/24/2021 Prepared by: ChriRivka Barbaraercises - Side Stepping with Counter Support  - 1 x daily - 7 x weekly - 2 sets - 10 reps - Standing Tandem Balance with Counter Support  - 1 x daily - 7 x weekly - 3 sets - 30 second hold - Standing March with Counter Support  - 1 x daily - 7 x weekly -  3 sets  - 10 reps - Sit to Stand Without Arm Support  - 1 x daily - 7 x weekly - 2 sets - 10 reps    GOALS: Goals reviewed with patient? Yes  SHORT TERM GOALS: Target date: 12/22/2021   Patient will be independent in home exercise program to improve strength/mobility for better functional independence with ADLs. Baseline: No HEP currently  Goal status: INITIAL    LONG TERM GOALS: Target date: 02/16/2022   1.  Patient (> 44 years old) will complete five times sit to stand test in < 15 seconds indicating an increased LE strength and improved balance. Baseline: 16.95 Goal status: INITIAL  2.  Patient will increase FOTO score to equal to or greater than  60   to demonstrate statistically significant improvement in mobility and quality of life.  Baseline: Test neck session Goal status: INITIAL   3.  Patient will increase Berg Balance score by > 6 points to demonstrate decreased fall risk during functional activities. Baseline: 44 Goal status: INITIAL   4.   Patient will improve DGI by 5 points or more to reduce fall risk and demonstrate improved gait ability. Baseline: 10 Goal status: INITIAL  5.   Patient will increase 10 meter walk test to >1.48ms as to improve gait speed for better community ambulation and to reduce fall risk. Baseline: .779m Goal status: INITIAL   ASSESSMENT:  CLINICAL IMPRESSION: Pt tolerated treatment well today. Treatment continues to focus on standing dynamic balance, and RLE NMR interventions added. Mirror used for proprioceptive feedback with stepping and cone tap interventions. Improved motor control, grading, and planning bil with repetition. Demonstrates in session improvement in foot clearance with obstacles without looking down. Increased difficulty with compliant surface balance with added cognitive load/dual task. Standing UE interventions performed with LUE to reduce RUE fatigue, as pt has OT after PT session. Decreased foot clearance noted with  fatigue. HEP updated with standing hip interventions. Pt will continue to benefit from skilled OPPT services to address deficits and improve overall safety/Ind with functional mobility.   OBJECTIVE IMPAIRMENTS Abnormal gait, cardiopulmonary status limiting activity, decreased activity tolerance, decreased balance, decreased endurance, decreased mobility, difficulty walking, decreased strength, and decreased safety awareness.   ACTIVITY LIMITATIONS lifting, bending, standing, squatting, stairs, transfers, and locomotion level  PARTICIPATION LIMITATIONS: meal prep, shopping, and community activity  PERSONAL FACTORS Age and 3+ comorbidities: left-sided carotid stenosis, non-insulin-dependent diabetes mellitus type 2, PAD, GERD, mild COPD, history of tobacco use, hyperlipidemia, hypertension  are also affecting patient's functional outcome.   REHAB POTENTIAL: Good  CLINICAL DECISION MAKING: Evolving/moderate complexity  EVALUATION COMPLEXITY: Moderate  PLAN: PT FREQUENCY: 2x/week  PT DURATION: 12 weeks  PLANNED INTERVENTIONS: Therapeutic exercises, Therapeutic activity, Neuromuscular re-education, Balance training, Gait training, Patient/Family education, Self Care, Joint mobilization, Stair training, Moist heat, and Manual therapy  PLAN FOR NEXT SESSION: begin POC, focus on R LE NMR to improve proprioception and coordination, obstacle clearance, dual task, continue plan    SaHerminio CommonsPT, DPT 10:20 AM,12/26/21 Physical Therapist - [facility]

## 2021-12-28 ENCOUNTER — Ambulatory Visit: Payer: Medicare Other | Admitting: Speech Pathology

## 2021-12-28 ENCOUNTER — Ambulatory Visit: Payer: Medicare Other | Admitting: Occupational Therapy

## 2021-12-28 ENCOUNTER — Ambulatory Visit: Payer: Medicare Other

## 2021-12-28 DIAGNOSIS — R262 Difficulty in walking, not elsewhere classified: Secondary | ICD-10-CM

## 2021-12-28 DIAGNOSIS — R2681 Unsteadiness on feet: Secondary | ICD-10-CM

## 2021-12-28 DIAGNOSIS — R482 Apraxia: Secondary | ICD-10-CM | POA: Diagnosis not present

## 2021-12-28 DIAGNOSIS — R278 Other lack of coordination: Secondary | ICD-10-CM

## 2021-12-28 DIAGNOSIS — M6281 Muscle weakness (generalized): Secondary | ICD-10-CM

## 2021-12-28 DIAGNOSIS — R4701 Aphasia: Secondary | ICD-10-CM

## 2021-12-28 DIAGNOSIS — R269 Unspecified abnormalities of gait and mobility: Secondary | ICD-10-CM

## 2021-12-28 NOTE — Therapy (Signed)
OUTPATIENT OCCUPATIONAL THERAPY NEURO TREATMENT  Patient Name: Gerald Bauer MRN: [id number] DOB:Mar 04, 1946, 76 y.o., male Today's Date: 12/28/2021  PCP: Threasa Alpha, PA REFERRING PROVIDER: Reesa Chew   OT End of Session - 12/28/21 1101     Visit Number 15    Number of Visits 24    Date for OT Re-Evaluation 01/29/22    Authorization Time Period Reporting period beginning 12/11/21    OT Start Time 64    OT Stop Time 1130    OT Time Calculation (min) 40 min    Activity Tolerance Patient tolerated treatment well    Behavior During Therapy [facility] for tasks assessed/performed             Past Medical History:  Diagnosis Date   Acid reflux    Arthritis    hands   Carotid atherosclerosis    Diabetes mellitus without complication ([place])    Ganglion cyst 02/23/2015   Hyperlipemia    Hypertension    Joint pain in fingers of right hand 02/23/2015   Dominant hand   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 07/11/2015   Prostate cancer ([place])    history   Stroke (cerebrum) ([place]) 10/06/2021   Tachycardia    Tinea pedis of both feet 01/10/2017   Past Surgical History:  Procedure Laterality Date   [place]  2013   PROSTATECTOMY  2012   Patient Active Problem List   Diagnosis Date Noted   Hemiparesis, aphasia, and dysphagia as late effects of stroke ([place]) 11/07/2021   Lower urinary tract symptoms (LUTS) 11/07/2021   Myalgia due to statin 02/21/2021   Type 2 diabetes mellitus without complication, without long-term current use of insulin ([place]) 02/21/2021   Aortic atherosclerosis ([place]) 04/27/2020   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 Reports 5/10 pain in the right shoulder, and 3/10 pain in the right wrist  PERTINENT HISTORY: Per chart, Gerald Bauer is a 76 year old right-handed male with history of hypertension, hyperlipidemia, diabetes mellitus, prostate cancer/prostatectomy 2012, quit smoking 24 years ago.  Presented to [facility] 10/05/2021 with right side weakness/facial droop and expressive aphasia.  Blood pressure 155/102.  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  No associated hemorrhage or significant mass effect.    PAIN:  Are you having pain? Yes: Pain location:  R shoulder 5/10 pain, 3/10 pain R wrist Pain description: Arthritic Pain Aggravating factors: movement  Relieving factors: rest, tylenol, heat  FALLS: Has patient fallen in last 6 months? Yes. Number of falls 3 since CVA  PLOF: Independent/retired Dealer  PATIENT GOALS : Pt points to his hand (wants to be able to use his hand)  OBJECTIVE:   HAND DOMINANCE: Right   FUNCTIONAL OUTCOME MEASURES: FOTO: 48  UUPPER EXTREMITY ROM      Active ROM Right eval Left  Eval WNL Right 12/11/21  Shoulder flexion 90 (135)   95 (110)  Shoulder abduction 95 (110)   80 (90)  Wrist flex     54  Wrist ext     41    UPPER EXTREMITY MMT:      MMT Right eval Left Eval 5/5 Right 12/11/21  Shoulder flexion 3-   -3  Shoulder abduction 3-   -3  Shoulder adduction        Shoulder extension        Shoulder internal rotation 3-      Shoulder external rotation 3-      Middle trapezius        Lower trapezius        Elbow flexion 4-   4  Elbow extension 4+   5  Wrist flexion 4   NT d/t  pain  Wrist extension 4-   NT d/t pain  (Blank rows = not tested)   HAND FUNCTION: Eval:         Grip strength: Right: 14 lbs; Left: 58 lbs, Lateral pinch: Right: 7 lbs, Left: 19 lbs, and 3 point pinch: Right: 2 lbs, Left: 16 lbs 10th visit: Grip strength: Right: 16  lbs;                                Lateral pinch: Right: 9  lbs;                            3 point pinch: Right: 5 lbs   Eval: COORDINATION: Finger Nose Finger test: difficult/lacks precision with reaching targets 9 Hole Peg test: Right: unable sec; Left: 27 sec Able to oppose 2nd and 3rd digits to thumb on R hand   10th visit 9 hole Peg test: Right: 11 min, 44 sec    TODAY'S TREATMENT:   Therapeutic Exercise:  Pt. Worked on BUE strengthening, and reciprocal motion using the UBE while seated for 8 min. with minimal resistance. Constant monitoring was provided.    Self Care:   Pt. Worked on using a knife with the right hand, and a knife in the left hand to simulate cutting meat using light blue resistive theraputty.  Pt. was able to securely hold a standard knife in his right hand while cutting. Pt. Requires increased time to complete. Pt. Worked on simulated hand to mouth patterns using a standard fork with the right hand.   PATIENT EDUCATION: Education details: HEP progression Person educated: pt Education method: Explanation and Verbal cues, handout Education comprehension: verbalized understanding, returned demo   HOME EXERCISE PROGRAM: theraputty  GOALS: Goals reviewed with patient? Yes   SHORT TERM GOALS: Target date: 12/18/21   Pt will be indep to perform HEP for increasing strength and coordination throughout RUE.  Baseline: Not yet initiated; 10th: putty given but pt reports limited use, instructed in table slides and self PROM for R wrist/digits.   Goal status: ongoing   2.  Pt will manage clothing fasteners with extra time, using RUE as an assist  Baseline: unable; pt has elastic laces and wears  elastic waisted pants; 10th: pt uses R hand as an assist to zip and button jeans with extra time; not yet tried small buttons on a shirt.  Pt continues to use elastic laces on shoes. Goal status: ongoing   LONG TERM GOALS: Target date: 01/29/22   Pt will  increase FOTO score to 55 or better to indicate improved performance with daily tasks.  Baseline: 48; 10th: 48 Goal status: ongoing   2.  Pt will increase R grip strength by 10 or more lbs to enable pt to hold and carry light ADL supplies without dropping.  Baseline: R grip 14#, L 58#; 10th visit: R grip 16 Goal status: INITIAL   3.  Pt will increase RUE strength to be able to engage RUE into ADLs at least 50% of the time. Baseline: Pt using L non-dominant arm to manage ADLs; 10th visit: Son reports he constantly sees pt attempt to use his R arm for daily tasks, but continues to be limited and still must use LUE for most tasks (see chart for RUE MMT) Goal status: ongoing   4.  Pt will complete 9 hole peg test on the R in 3 min or less to work towards ability to use R hand to pick up small ADL supplies.  Baseline: Pt can remove a peg but can not pick one up; 10th visit: R 9 hole completion 11 min 44 sec Goal status: ongoing    ASSESSMENT:  CLINICAL IMPRESSION:   Pt. Continues to  Present with 5/10 pain in R shoulder this date, 3/10 in R wrist.  Pt tolerated the UBE well this date.  Pt. Was able to grasp and securely hold a standard knife in his right hand while cutting through the resistive putty. Pt. Was able to hold a standard fork today in the right hand  to spear, scoop, and pick up resistive theraputty pieces. Pt. was able to perform simulated hand to mouth patterns with the fork in the right hand. Patient continues to work on right UE strength, grip strength, pinch strength, and fine motor coordination skills in order to improve RUE functioning, and improve engagement in, and maximize independence with ADLs and IADL  tasks.  PERFORMANCE DEFICITS in functional skills including ADLs, IADLs, coordination, dexterity, ROM, strength, pain, flexibility, FMC, GMC, mobility, balance, continence, decreased knowledge of use of DME, and UE functional use, cognitive skills including safety awareness, and psychosocial skills including.   IMPAIRMENTS are limiting patient from ADLs, IADLs, leisure, and social participation.   COMORBIDITIES may have co-morbidities  that affects occupational performance. Patient will benefit from skilled OT to address above impairments and improve overall function.  MODIFICATION OR ASSISTANCE TO COMPLETE EVALUATION: Min-Moderate modification of tasks or assist with assess necessary to complete an evaluation.  OT OCCUPATIONAL PROFILE AND HISTORY: Problem focused assessment: Including review of records relating to presenting problem.  CLINICAL DECISION MAKING: Moderate - several treatment options, min-mod task modification necessary  REHAB POTENTIAL: Good  EVALUATION COMPLEXITY: Moderate    PLAN: OT FREQUENCY: 2x/week  OT DURATION: 12 weeks  PLANNED INTERVENTIONS: self care/ADL training, therapeutic exercise, therapeutic activity, neuromuscular re-education, manual therapy, passive range of motion, balance training, functional mobility training, moist heat, cryotherapy, patient/family education, cognitive remediation/compensation, energy conservation, coping strategies training, and DME and/or AE instructions  RECOMMENDED OTHER SERVICES: N/A  CONSULTED AND AGREED WITH PLAN OF CARE: Patient and family member/caregiver  PLAN FOR NEXT SESSION: HEP progression, neuro re-ed, therapeutic exercises  Harrel Carina, MS, OTR/L   Harrel Carina, OT 12/28/2021, 11:04 AM

## 2021-12-28 NOTE — Therapy (Signed)
OUTPATIENT PHYSICAL THERAPY NEURO TREATMENT   Patient Name: Gerald Bauer MRN: [id number] DOB:05/31/1945, 76 y.o., male Today's Date: 12/28/2021   PCP: [name], PA-C REFERRING PROVIDER: Bary Leriche, PA-C   PT End of Session - 12/28/21 1009     Visit Number 9    Number of Visits 24    Date for PT Re-Evaluation 02/16/22    Progress Note Due on Visit 10    PT Start Time 1005    PT Stop Time 1045    PT Time Calculation (min) 40 min    Equipment Utilized During Treatment Gait belt    Activity Tolerance Patient tolerated treatment well    Behavior During Therapy WFL for tasks assessed/performed                    Past Medical History:  Diagnosis Date   Acid reflux    Arthritis    hands   Carotid atherosclerosis    Diabetes mellitus without complication ([place])    Ganglion cyst 02/23/2015   Hyperlipemia    Hypertension    Joint pain in fingers of right hand 02/23/2015   Dominant hand   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   Onychomycosis of multiple toenails with type 2 diabetes mellitus ([place]) 07/19/2016   Carotid stenosis 03/13/2016   Hyperlipidemia associated with type 2 diabetes mellitus ([place])    Hypertension associated with type 2 diabetes  mellitus ([place])    Personal history of prostate cancer    Carotid atherosclerosis    GERD (gastroesophageal reflux disease) 08/20/2013    ONSET DATE: 10/05/21  REFERRING DIAG: D22.025 (ICD-10-CM) - Cerebral infarction due to unspecified occlusion or stenosis of left middle cerebral artery  THERAPY DIAG:  Difficulty in walking, not elsewhere classified  Muscle weakness (generalized)  Unsteadiness on feet  Abnormality of gait and mobility  Rationale for Evaluation and Treatment Rehabilitation  SUBJECTIVE:  SUBJECTIVE STATEMENT:  Pt reports his shoulder pain is still terrible, reporting 9/10 pain today.  Pt denies any falls since last visit.  Pt continues to feel more secure with use of the RW.  Pt accompanied by: self  PAIN today:  Are you having pain? Yes: NPRS scale: 9/10 Pain location: bil shoulders, achey  PERTINENT HISTORY:Per H and P from 7/17 hospital d/c Pt  is a 76 year old male with history of left-sided carotid stenosis, non-insulin-dependent diabetes mellitus type 2, PAD, GERD, mild COPD, history of tobacco use, hyperlipidemia, hypertension, who presents emergency department for chief concerns of facial droop, expressive aphasia, and R sided weakness  PAIN:  Are you having pain? Yes: NPRS scale: 9/10 Pain location: B shoulders, with R being primary complaint  PRECAUTIONS: Fall  WEIGHT BEARING RESTRICTIONS No  FALLS: Has patient fallen in last 6 months? Yes. Number of falls 1  LIVING ENVIRONMENT: Lives with: lives with their family and lived alone prior to onset  Lives in: House/apartment Stairs: Yes: External: 4 steps; can reach both Has following equipment at home: Walker - 2 wheeled and shower chair  PLOF: Independent  PATIENT GOALS become independent with everyday  activities like he was prior to the onset of the stroke   OBJECTIVE:   DIAGNOSTIC FINDINGS: 1. 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 No associated hemorrhage or significant regional mass effect.  2. Loss of normal flow voids throughout the left ICA and MCA,  consistent with slow flow and/or occlusion. Susceptibility artifact  within proximal left MCA branches consistent with intraluminal  thrombus.  3. Underlying mild chronic microvascular ischemic disease. Tiny  remote left ACA distribution infarct.     MRA HEAD IMPRESSION:   "1. Interval near complete occlusion of the left ICA and MCA,  presumably acute in nature given the presence of the acute left MCA  territory infarct. Left A1 segment not well seen either, also likely  occluded.  2. Otherwise wide patency of the major intracranial arterial  circulation. No other hemodynamically significant or correctable  stenosis.     MRA NECK IMPRESSION:  1. 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 Some attenuated flow seen  distally within the cervical left ICA, with subsequent reocclusion  by the skull base.  2. 50% atheromatous stenosis at the origin of the cervical right  ICA. Otherwise wide patency of the right carotid artery system.  3. Wide patency of the vertebral arteries within the neck.".    COGNITION: Overall cognitive status: Within functional limits for tasks assessed and subjective history limited secondary to aphasia but able to answer 1 word questions appropriately.    SENSATION: WFL  COORDINATION: WNL with heel to shin testing    LOWER EXTREMITY ROM:   AROM WNL    LOWER EXTREMITY MMT:    MMT Right Eval Left Eval  Hip flexion 4 4+  Hip extension    Hip abduction 4 5  Hip adduction 4 5  Hip internal rotation    Hip external rotation    Knee flexion 4 5  Knee extension 4 5  Ankle dorsiflexion 4+ 5  Ankle plantarflexion 4+ 5  Ankle  inversion 5 5  Ankle eversion 5 5  (Blank rows = not tested)   TRANSFERS: Assistive device utilized: Environmental consultant - 2 wheeled  Sit to stand: Modified independence Stand to sit: Complete Independence Chair to chair: SBA  STAIRS:  Level of  Assistance: SBA Stair Negotiation Technique: Step to Pattern with Bilateral Rails Number of Stairs: 4  Height of Stairs: 6in  Comments: Right lower extremity foot catches when a sending steps and patient performs descending steps utilizing right lower extremity for more support.  Patient reports he knows he is doing this "the hard way"  GAIT: Gait pattern: decreased arm swing- Right and decreased step length- Right Distance walked: 10 M Assistive device utilized: Environmental consultant - 2 wheeled and None Level of assistance: Modified independence Comments: difficulty with turns and object navigation per DGI tesitng, able to walk without AD but only in controlled environment without obstacles or significant challenge  FUNCTIONAL TESTs:  5 times sit to stand: 16.95 sec no UE assist 10 meter walk test: .76 m/s without AD  Berg Balance Scale: 44/56 DGI:10  PATIENT SURVEYS:  FOTO to retest next session, likely with either asking patient questions or having caregiver complete patient completed but had 97% indicating he could run and jump with these and when questioned about this following patient reports he cannot run or jump with these and would benefit from filling this out again next time.  12/04/2021: 49 (goal 62)    TODAY'S TREATMENT:  Gait belt donned, CGA provided unless noted otherwise  NMR: Seated BP prior to session 148/93 mmHg  HR 90 bpm  Obstacle course: x4 reps  - NBOS with EC, x15 sec  - stepping over 1/2 foam roll alt leading with RLE vs. LLE  - stepping over hedgehogs for proper step length  68f ambulation in hallway without AD:  - backwards ambulation length of hallway x2  - side stepping with serial counting length of hallway x2  - high  knees length of hallway x2  Standing on airex pad:  - NBOS tic-tac-toe with RUE  - NBOS reaching outside BOS, moving colored letters into same colored box (alternating UE's), multiple attempts per UE  - staggered stance with RLE on airex and LLE on ground, matching colored letters to colored dots, then moving colored letters into same colored box (LUE), x237m   PATIENT EDUCATION: Education details: Pt educated throughout session about proper posture and technique with exercises. Improved exercise technique, movement at target joints, use of target muscles after min to mod verbal, visual, tactile cues.   Person educated: Patient Education method: Explanation, Demonstration, Tactile cues, and Verbal cues Education comprehension: verbalized understanding, returned demonstration, verbal cues required, tactile cues required, and needs further education   HOME EXERCISE PROGRAM:  No updates today, pt to continue as previously given  9/11: Pt to continue HEP as previously given. However, PT did provide education to use a chair with arm-rests for lateral support when performing STS  10/3: added standing hip ABD/ext with UE support on counter  Access Code: [email or web address] URL: [email or web address] Date: 11/24/2021 Prepared by: ChRivka BarbaraExercises - Side Stepping with Counter Support  - 1 x daily - 7 x weekly - 2 sets - 10 reps - Standing Tandem Balance with Counter Support  - 1 x daily - 7 x weekly - 3 sets - 30 second hold - Standing March with Counter Support  - 1 x daily - 7 x weekly - 3 sets - 10 reps - Sit to Stand Without Arm Support  - 1 x daily - 7 x weekly - 2 sets - 10 reps    GOALS: Goals reviewed with patient? Yes  SHORT TERM GOALS: Target date: 12/22/2021   Patient will be independent in home exercise program  to improve strength/mobility for better functional independence with ADLs. Baseline: No HEP currently  Goal status: INITIAL    LONG TERM GOALS:  Target date: 02/16/2022   1.  Patient (> 23 years old) will complete five times sit to stand test in < 15 seconds indicating an increased LE strength and improved balance. Baseline: 16.95 Goal status: INITIAL  2.  Patient will increase FOTO score to equal to or greater than  60   to demonstrate statistically significant improvement in mobility and quality of life.  Baseline: Test neck session Goal status: INITIAL   3.  Patient will increase Berg Balance score by > 6 points to demonstrate decreased fall risk during functional activities. Baseline: 44 Goal status: INITIAL   4.   Patient will improve DGI by 5 points or more to reduce fall risk and demonstrate improved gait ability. Baseline: 10 Goal status: INITIAL  5.   Patient will increase 10 meter walk test to >1.71ms as to improve gait speed for better community ambulation and to reduce fall risk. Baseline: .743m Goal status: INITIAL   ASSESSMENT:  CLINICAL IMPRESSION:   Pt responded well today to treatment approach.  Pt continues to have difficulty with RUE fatigue with standing exercises and navigating the RUE for grasp of the letters when attempting to place them in the same colored boxes.  Pt demonstrated good balance without AD in hallway with tasks and is able to achieve increased hip flexion with ambulation after performing high knees exercise the length of the hallway.  May introduce weighted resistance exercise at next visit to assess tolerance and improve strengthening.   Pt will continue to benefit from skilled therapy to address remaining deficits in order to improve overall QoL and return to PLOF.      OBJECTIVE IMPAIRMENTS Abnormal gait, cardiopulmonary status limiting activity, decreased activity tolerance, decreased balance, decreased endurance, decreased mobility, difficulty walking, decreased strength, and decreased safety awareness.   ACTIVITY LIMITATIONS lifting, bending, standing, squatting, stairs,  transfers, and locomotion level  PARTICIPATION LIMITATIONS: meal prep, shopping, and community activity  PERSONAL FACTORS Age and 3+ comorbidities: left-sided carotid stenosis, non-insulin-dependent diabetes mellitus type 2, PAD, GERD, mild COPD, history of tobacco use, hyperlipidemia, hypertension  are also affecting patient's functional outcome.   REHAB POTENTIAL: Good  CLINICAL DECISION MAKING: Evolving/moderate complexity  EVALUATION COMPLEXITY: Moderate  PLAN: PT FREQUENCY: 2x/week  PT DURATION: 12 weeks  PLANNED INTERVENTIONS: Therapeutic exercises, Therapeutic activity, Neuromuscular re-education, Balance training, Gait training, Patient/Family education, Self Care, Joint mobilization, Stair training, Moist heat, and Manual therapy  PLAN FOR NEXT SESSION: goal assessment as part of 10th visit    JoGwenlyn SaranPT, DPT 12/28/21, 10:10 AM Physical Therapist - [facility]

## 2021-12-28 NOTE — Therapy (Signed)
OUTPATIENT SPEECH LANGUAGE PATHOLOGY TREATMENT NOTE   Patient Name: Gerald Bauer MRN: [id number] DOB:1945-06-14, 76 y.o., male Today's Date: 12/28/2021  PCP: [name], PA-C REFERRING PROVIDER: Reesa Chew, PA-C  END OF SESSION:   End of Session - 12/28/21 1011     Visit Number 13    Number of Visits 25    Date for SLP Re-Evaluation 02/04/22    SLP Start Time 0900    SLP Stop Time  1000    SLP Time Calculation (min) 60 min    Activity Tolerance Patient tolerated treatment well             Past Medical History:  Diagnosis Date   Acid reflux    Arthritis    hands   Carotid atherosclerosis    Diabetes mellitus without complication ([place])    Ganglion cyst 02/23/2015   Hyperlipemia    Hypertension    Joint pain in fingers of right hand 02/23/2015   Dominant hand   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 07/11/2015   Prostate cancer ([place])    history   Stroke (cerebrum) ([place]) 10/06/2021   Tachycardia    Tinea pedis of both feet 01/10/2017   Past Surgical History:  Procedure Laterality Date   [place]  2013   PROSTATECTOMY  2012   Patient Active Problem List   Diagnosis Date Noted   Hemiparesis, aphasia, and dysphagia as late effects of stroke ([place]) 11/07/2021   Lower urinary tract symptoms (LUTS) 11/07/2021   Myalgia due to statin 02/21/2021   Type 2 diabetes mellitus without complication, without long-term current use of insulin ([place]) 02/21/2021   Aortic atherosclerosis ([place]) 04/27/2020   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   Onychomycosis of multiple toenails with type 2 diabetes mellitus ([place]) 07/19/2016   Carotid stenosis 03/13/2016   Hyperlipidemia associated with type 2 diabetes mellitus ([place])    Hypertension associated with type 2 diabetes mellitus ([place])    Personal history of prostate cancer    Carotid atherosclerosis    GERD (gastroesophageal reflux disease)  08/20/2013    ONSET DATE: 10/05/2021    REFERRING DIAG: Cerebral infarction due to unspecified occlusion of left middle cerebral artery  THERAPY DIAG:  Aphasia Verbal apraxia  Rationale for Evaluation and Treatment Rehabilitation  SUBJECTIVE STATEMENT:             Patient arrived pleasant and motivated for therapy   Pt accompanied by: self  PERTINENT HISTORY: Pt  is a 76 year old male with history of left-sided carotid stenosis, non-insulin-dependent diabetes mellitus type 2, PAD, GERD, mild COPD, history of tobacco use, hyperlipidemia, hypertension, who presented 10/05/21 to [facility] ED with facial droop, expressive aphasia, and R sided weakness and found to have left MCA CVA. He was admitted to [facility] 7/17-10/31/21. Advanced to dysphagia 3/thin liquids by cup by time of d/c from CIR. 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 Difficult to fully assess cognition given severity of linguistic impairment; however, does present with decreased safety awareness, diminished problem-solving, emergent awareness, and mild impulsivity with movement."     DIAGNOSTIC FINDINGS: MBS 10/19/21: "Pt presents with overall mild oropharyngeal dysphagia. Oral phase c/b piecemeal deglutition, decreased mastication, decreased bolus cohesion, premature spillage, and weak lingual manipulation. Pharyngeal phase c/b reduced pharyngeal peristalsis + reduced BOT approximation to PPW, which results in mild to moderate vallecular  and pyriform sinuses residuals (vallecula > pyriform sinuses for solids, pyriform sinuses > vallecula for liquids). No penetration nor aspiration appreciated with thin liquid via cup, Dysphagia 1 (puree), Dysphagia 2 (chopped), Dysphagia 3 (mechanical soft), or with 13 mm Barium tablet placed in puree. Once instance of sensed aspiration with intake of large bolus of thin liquid via straw due to swallow  initiation delay to pyriform sinuses and mistiming of swallow-breath cycle; despite strong reflexive cough aspirant was not fully cleared from trachea. Pt's level of swallow initiation was noted to vary with liquid consumption (over base of epiglottis and at pyriform sinuses) with level of swallow initiation for solids to be consistent at the vallecula. Pharyngeal stasis was partially cleared with reflexive secondary swallow." MRI brain 10/05/21: 1. 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 No associated hemorrhage or significant regional mass effect. 2. Loss of normal flow voids throughout the left ICA and MCA, consistent with slow flow and/or occlusion. 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 3. Underlying mild chronic microvascular ischemic disease. Tiny remote left ACA distribution infarct.  PAIN:  Are you having pain? No  PATIENT GOALS talk better   OBJECTIVE:    TODAY'S TREATMENT:  SLP skilled intervention targeted discourse level verbal expression with multimodal supports to assist pt with unfamiliar listener (SLP new to patient). Patient independently initiated multimodal communication repairs via alphabet board x1, writing x4, phone contact list x1, phone picture x1 and phone call x1. Trained patient in focusing on key words during communication repairs with SLP x3 and during phone call with son x1 with moderate assist to transfer strategies to phone call.  Patient demonstrated strategies for apraxia (slow rate, tapping and segmenting words) with minimal cues to initiate strategies across a conversation. Slp supported with multimodal assist to develop a written family tree to assist with conversational support and for practiced intelligibility of names of important family members.  Patient demonstrated verbal and provided written visual of emergency information IND though ~50% intelligibility. Improved with slowed  re-attempt and SLP directed visual pacing.      PATIENT EDUCATION: Education details: see today's treatment for details Person educated: Patient Education method: Explanation, Demonstration, and written supports Education comprehension: verbalized understanding and needs further education     GOALS: Goals reviewed with patient? Yes   SHORT TERM GOALS: Target date: 10 sessions   Patient will participate in clinical assessment of swallow function with goals added as needed. Baseline: Goal status: MET   2.  Pt will communicate emergency information 100% accuracy using visual aid if necessary.  Baseline:  Goal status: IN PROGRESS (partially met, will continue; 70% intelligible with mod-max cues)   3.  Pt will approximate personally relevant words and phrases >80% accuracy using script training and min visual cues for apraxia.   Baseline:  Goal status: MET   4.  Pt will generate at least 4 descriptors of target word 80% of the time using semantic feature analysis to improve abilities in wordfinding and resolving communication breakdowns.  Baseline:  Goal status: MET   5.  Pt will complete HEP for dysphagia with rare min A. Baseline:  Goal status: MET   6.  Pt will generate sentences using personally relevant words list for >80% intelligibility. Baseline: 50% intelligibility Goal status: INITIAL   5.  Pt will improve expressive language skills by taking 4-6 turns in simple conversation, with supported conversation strategies if needed. Baseline:  Goal status: INITIAL  LONG TERM GOALS: Target date: 02/04/2022   Pt will engage in 5-8 minutes simple-mod complex conversation re: topic of interest with supported conversation, aphasia compensations.  Baseline:  Goal status: IN PROGRESS   2.  Pt will ID and attempt repair of communication breakdowns >90% of the time in session.    Baseline:  Goal status: IN PROGRESS   3.  Pt/family will demonstrate knowledge of  community resources and activities to support language/communication. Baseline:  Goal status: IN PROGRESS   4.  Pt will demonstrate use of swallowing precautions independently with s/sx aspiration <5% of trials. Baseline:  Goal status: IN PROGRESS     ASSESSMENT:   CLINICAL IMPRESSION: Patient presents with moderate Broca's aphasia, verbal apraxia, dysarthria (difficult to determine extent due to language deficits and apraxia), and mild oropharyngeal dysphagia. With encouragement, extended time and mod cues, pt able to repair of breakdowns using multimodal means including alphabet board, writing at word level (agrammatism present), drawing, and description, although extended time and mod cues required. Tapping hand while segmenting words/syllables highly effective to slow rate and improve productions. Patient demonstrates good use of nonverbals and Y/N responses, however is significantly limited in his ability to relay thoughts, feelings, and non-contextual or abstract information. Pt appears motivated and is demonstrating excellent progress toward goals. I recommend skilled ST to improve pt's swallowing, language and motor speech skills to increase swallowing safety, improve communication and reduce frustration.    OBJECTIVE IMPAIRMENTS include expressive language, receptive language, aphasia, apraxia, dysarthria, and dysphagia. These impairments are limiting patient from managing medications, managing appointments, managing finances, household responsibilities, ADLs/IADLs, effectively communicating at home and in community, and safety when swallowing. Factors affecting potential to achieve goals and functional outcome are cooperation/participation level; pt appears highly motivated with good family support. Patient will benefit from skilled SLP services to address above impairments and improve overall function.   REHAB POTENTIAL: Excellent   PLAN: SLP FREQUENCY: 2x/week   SLP DURATION: 12  weeks   PLANNED INTERVENTIONS: Aspiration precaution training, Pharyngeal strengthening exercises, Diet toleration management , Language facilitation, Environmental controls, Trials of upgraded texture/liquids, Cognitive reorganization, Internal/external aids, Functional tasks, Multimodal communication approach, SLP instruction and feedback, Compensatory strategies, and Patient/family education   Encounter Date: 12/28/2021 Ethelene Hal, M.A. [place], CCC-SLP 12/28/2021, 10:12 AM  [place] [facility] [address] [place], [place], [address] Phone: [phone]   Fax:  [phone]

## 2022-01-02 ENCOUNTER — Ambulatory Visit: Payer: Medicare Other | Admitting: Occupational Therapy

## 2022-01-02 ENCOUNTER — Ambulatory Visit: Payer: Medicare Other

## 2022-01-02 ENCOUNTER — Ambulatory Visit: Payer: Medicare Other | Admitting: Speech Pathology

## 2022-01-02 DIAGNOSIS — R4701 Aphasia: Secondary | ICD-10-CM

## 2022-01-02 DIAGNOSIS — R1312 Dysphagia, oropharyngeal phase: Secondary | ICD-10-CM

## 2022-01-02 DIAGNOSIS — R262 Difficulty in walking, not elsewhere classified: Secondary | ICD-10-CM

## 2022-01-02 DIAGNOSIS — R278 Other lack of coordination: Secondary | ICD-10-CM

## 2022-01-02 DIAGNOSIS — R269 Unspecified abnormalities of gait and mobility: Secondary | ICD-10-CM

## 2022-01-02 DIAGNOSIS — M6281 Muscle weakness (generalized): Secondary | ICD-10-CM

## 2022-01-02 DIAGNOSIS — R2681 Unsteadiness on feet: Secondary | ICD-10-CM

## 2022-01-02 DIAGNOSIS — R482 Apraxia: Secondary | ICD-10-CM

## 2022-01-02 DIAGNOSIS — I63512 Cerebral infarction due to unspecified occlusion or stenosis of left middle cerebral artery: Secondary | ICD-10-CM

## 2022-01-02 DIAGNOSIS — R41841 Cognitive communication deficit: Secondary | ICD-10-CM

## 2022-01-02 NOTE — Therapy (Signed)
OUTPATIENT OCCUPATIONAL THERAPY NEURO TREATMENT  Patient Name: Gerald Bauer MRN: [id number] DOB:05-31-1945, 76 y.o., male Today's Date: 01/02/2022  PCP: Threasa Alpha, PA REFERRING PROVIDER: Reesa Chew   OT End of Session - 01/02/22 1408     Visit Number 16    Number of Visits 24    Date for OT Re-Evaluation 01/29/22    Authorization Time Period Reporting period beginning 12/11/21    OT Start Time 61    OT Stop Time 1145    OT Time Calculation (min) 45 min    Activity Tolerance Patient tolerated treatment well    Behavior During Therapy [facility] for tasks assessed/performed             Past Medical History:  Diagnosis Date   Acid reflux    Arthritis    hands   Carotid atherosclerosis    Diabetes mellitus without complication ([place])    Ganglion cyst 02/23/2015   Hyperlipemia    Hypertension    Joint pain in fingers of right hand 02/23/2015   Dominant hand   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 07/11/2015   Prostate cancer ([place])    history   Stroke (cerebrum) ([place]) 10/06/2021   Tachycardia    Tinea pedis of both feet 01/10/2017   Past Surgical History:  Procedure Laterality Date   [place]  2013   PROSTATECTOMY  2012   Patient Active Problem List   Diagnosis Date Noted   Hemiparesis, aphasia, and dysphagia as late effects of stroke ([place]) 11/07/2021   Lower urinary tract symptoms (LUTS) 11/07/2021   Myalgia due to statin 02/21/2021   Type 2 diabetes mellitus without complication, without long-term current use of insulin ([place]) 02/21/2021   Aortic atherosclerosis ([place]) 04/27/2020   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   Onychomycosis of multiple toenails with type 2 diabetes mellitus ([place]) 07/19/2016   Carotid stenosis 03/13/2016   Hyperlipidemia associated with type 2 diabetes mellitus ([place])    Hypertension associated with type 2 diabetes mellitus ([place])    Personal history of  prostate cancer    Carotid atherosclerosis    GERD (gastroesophageal reflux disease) 08/20/2013    ONSET DATE: 10/05/21  REFERRING DIAG: L MCA CVA  THERAPY DIAG:  Muscle weakness (generalized)  Other lack of coordination  Rationale for Evaluation and Treatment Rehabilitation  SUBJECTIVE:    SUBJECTIVE STATEMENT:   Pt. reports 9/10 pain in the bilateral shoulder arthritic pain.  PERTINENT HISTORY: Per chart, Gerald Bauer is a 76 year old right-handed male with history of hypertension, hyperlipidemia, diabetes mellitus, prostate cancer/prostatectomy 2012, quit smoking 24 years ago.  [facility]2021 with right side weakness/facial droop and expressive aphasia.  Blood pressure 155/102.  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  No associated hemorrhage or significant mass effect.    PAIN:  Are you having pain? Yes: Pain location: bilateral shoulder arthritic pain Pain description: Arthritic Pain Aggravating factors: movement  Relieving factors: rest, tylenol, heat  FALLS: Has patient fallen in last 6 months? Yes. Number of falls 3 since CVA  PLOF: Independent/retired Dealer  PATIENT GOALS : Pt points to his hand (wants to be able to use his hand)  OBJECTIVE:   HAND DOMINANCE: Right   FUNCTIONAL OUTCOME MEASURES: FOTO: 48  UUPPER EXTREMITY ROM      Active ROM Right eval Left Eval WNL Right 12/11/21  Shoulder flexion 90 (135)  95 (110)  Shoulder abduction 95 (110)   80 (90)  Wrist flex     54  Wrist ext     41    UPPER EXTREMITY MMT:      MMT Right eval Left Eval 5/5 Right 12/11/21  Shoulder flexion 3-   -3  Shoulder abduction 3-   -3  Shoulder adduction        Shoulder extension        Shoulder internal rotation 3-      Shoulder external rotation 3-      Middle trapezius        Lower trapezius        Elbow flexion 4-   4  Elbow extension 4+   5  Wrist flexion 4   NT d/t pain  Wrist  extension 4-   NT d/t pain  (Blank rows = not tested)   HAND FUNCTION: Eval:         Grip strength: Right: 14 lbs; Left: 58 lbs, Lateral pinch: Right: 7 lbs, Left: 19 lbs, and 3 point pinch: Right: 2 lbs, Left: 16 lbs 10th visit: Grip strength: Right: 16  lbs;                                Lateral pinch: Right: 9  lbs;                            3 point pinch: Right: 5 lbs   Eval: COORDINATION: Finger Nose Finger test: difficult/lacks precision with reaching targets 9 Hole Peg test: Right: unable sec; Left: 27 sec Able to oppose 2nd and 3rd digits to thumb on R hand   10th visit 9 hole Peg test: Right: 11 min, 44 sec    TODAY'S TREATMENT:   Therapeutic Exercise:  Pt. performed gross gripping with a gross grip strengthener. Pt. worked on sustaining grip while grasping pegs and placing them into the container placed on the tabletop to the right of the pegboard. The gripper was set to 17.9# of grip strength resistance, then modified to 11.2#. Pt. performed resistive EZ Board exercises for forearm supination/pronation, wrist flexion/extension using gross grasp, and lateral pinch (key) grasp with the board set at the tabletop.  neuromuscular re-education:  Pt. worked on grasping 1" resistive cubes alternating thumb opposition to the tip of the 2nd digit while the board is placed at a vertical angle. Pt. worked on pressing the cubes back into place while performing isolated 2nd digit extension.  Pt. worked on using his right hand for grasping 1/2" flat marbles. Pt. worked on translatory movements storing the marbles in the palm of his hand, and moving the marbles from his palm to the tip of his 2nd digit, and thumb in preparation for placing them onto the tabletop surface.        PATIENT EDUCATION: Education details: HEP progression Person educated: pt Education method: Explanation and Verbal cues, handout Education comprehension: verbalized understanding, returned demo   HOME EXERCISE  PROGRAM: theraputty  GOALS: Goals reviewed with patient? Yes   SHORT TERM GOALS: Target date: 12/18/21   Pt will be indep to perform HEP for increasing strength and coordination throughout RUE.  Baseline: Not yet initiated; 10th: putty given but pt reports limited use, instructed in table slides and self PROM for R wrist/digits.   Goal status: ongoing   2.  Pt will manage  clothing fasteners with extra time, using RUE as an assist  Baseline: unable; pt has elastic laces and wears elastic waisted pants; 10th: pt uses R hand as an assist to zip and button jeans with extra time; not yet tried small buttons on a shirt.  Pt continues to use elastic laces on shoes. Goal status: ongoing   LONG TERM GOALS: Target date: 01/29/22   Pt will increase FOTO score to 55 or better to indicate improved performance with daily tasks.  Baseline: 48; 10th: 48 Goal status: ongoing   2.  Pt will increase R grip strength by 10 or more lbs to enable pt to hold and carry light ADL supplies without dropping.  Baseline: R grip 14#, L 58#; 10th visit: R grip 16 Goal status: INITIAL   3.  Pt will increase RUE strength to be able to engage RUE into ADLs at least 50% of the time. Baseline: Pt using L non-dominant arm to manage ADLs; 10th visit: Son reports he constantly sees pt attempt to use his R arm for daily tasks, but continues to be limited and still must use LUE for most tasks (see chart for RUE MMT) Goal status: ongoing   4.  Pt will complete 9 hole peg test on the R in 3 min or less to work towards ability to use R hand to pick up small ADL supplies.  Baseline: Pt can remove a peg but can not pick one up; 10th visit: R 9 hole completion 11 min 44 sec Goal status: ongoing    ASSESSMENT:  CLINICAL IMPRESSION:   Pt. Presents with 9/10 arthritic pain in the bilateral shoulders. Pt. Required consistent verbal cues, and cues for visual demonstration of proper technique, and for movement patterns  when  grasping the cubes, isolating the 2nd digit, and for translatory movements of the right hand. Pt. Required increased time to engage his right thumb during the translatory movements of the hand. Patient continues to work on right UE strength, grip strength, pinch strength, and fine motor coordination skills in order to improve RUE functioning, and improve engagement in, and maximize independence with ADLs and IADL tasks.  PERFORMANCE DEFICITS in functional skills including ADLs, IADLs, coordination, dexterity, ROM, strength, pain, flexibility, FMC, GMC, mobility, balance, continence, decreased knowledge of use of DME, and UE functional use, cognitive skills including safety awareness, and psychosocial skills including.   IMPAIRMENTS are limiting patient from ADLs, IADLs, leisure, and social participation.   COMORBIDITIES may have co-morbidities  that affects occupational performance. Patient will benefit from skilled OT to address above impairments and improve overall function.  MODIFICATION OR ASSISTANCE TO COMPLETE EVALUATION: Min-Moderate modification of tasks or assist with assess necessary to complete an evaluation.  OT OCCUPATIONAL PROFILE AND HISTORY: Problem focused assessment: Including review of records relating to presenting problem.  CLINICAL DECISION MAKING: Moderate - several treatment options, min-mod task modification necessary  REHAB POTENTIAL: Good  EVALUATION COMPLEXITY: Moderate    PLAN: OT FREQUENCY: 2x/week  OT DURATION: 12 weeks  PLANNED INTERVENTIONS: self care/ADL training, therapeutic exercise, therapeutic activity, neuromuscular re-education, manual therapy, passive range of motion, balance training, functional mobility training, moist heat, cryotherapy, patient/family education, cognitive remediation/compensation, energy conservation, coping strategies training, and DME and/or AE instructions  RECOMMENDED OTHER SERVICES: N/A  CONSULTED AND AGREED WITH PLAN OF  CARE: Patient and family member/caregiver  PLAN FOR NEXT SESSION: HEP progression, neuro re-ed, therapeutic exercises  Harrel Carina, MS, OTR/L   Harrel Carina, OT 01/02/2022, 2:11 PM

## 2022-01-02 NOTE — Therapy (Signed)
OUTPATIENT PHYSICAL THERAPY NEURO PROGRESS NOTE   Patient Name: Gerald Bauer MRN: [id number] DOB:1946/01/16, 76 y.o., male Today's Date: 01/02/2022   PCP: [name], PA-C REFERRING PROVIDER: Bary Leriche, PA-C   PT End of Session - 01/02/22 1018     Visit Number 10    Number of Visits 24    Date for PT Re-Evaluation 02/16/22    Progress Note Due on Visit 10    PT Start Time 1015    PT Stop Time 1100    PT Time Calculation (min) 45 min    Equipment Utilized During Treatment Gait belt    Activity Tolerance Patient tolerated treatment well    Behavior During Therapy WFL for tasks assessed/performed                     Past Medical History:  Diagnosis Date   Acid reflux    Arthritis    hands   Carotid atherosclerosis    Diabetes mellitus without complication ([place])    Ganglion cyst 02/23/2015   Hyperlipemia    Hypertension    Joint pain in fingers of right hand 02/23/2015   Dominant hand   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 07/11/2015   Prostate cancer ([place])    history   Stroke (cerebrum) ([place]) 10/06/2021   Tachycardia    Tinea pedis of both feet 01/10/2017   Past Surgical History:  Procedure Laterality Date   [place]  2013   PROSTATECTOMY  2012   Patient Active Problem List   Diagnosis Date Noted   Hemiparesis, aphasia, and dysphagia as late effects of stroke ([place]) 11/07/2021   Lower urinary tract symptoms (LUTS) 11/07/2021   Myalgia due to statin 02/21/2021   Type 2 diabetes mellitus without complication, without long-term current use of insulin ([place]) 02/21/2021   Aortic atherosclerosis ([place]) 04/27/2020   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   Onychomycosis of multiple toenails with type 2 diabetes mellitus ([place]) 07/19/2016   Carotid stenosis 03/13/2016   Hyperlipidemia associated with type 2 diabetes mellitus ([place])    Hypertension associated with type 2  diabetes mellitus ([place])    Personal history of prostate cancer    Carotid atherosclerosis    GERD (gastroesophageal reflux disease) 08/20/2013    ONSET DATE: 10/05/21  REFERRING DIAG: Y81.448 (ICD-10-CM) - Cerebral infarction due to unspecified occlusion or stenosis of left middle cerebral artery  THERAPY DIAG:  Muscle weakness (generalized)  Other lack of coordination  Difficulty in walking, not elsewhere classified  Unsteadiness on feet  Abnormality of gait and mobility  Aphasia  Left middle cerebral artery stroke (HCC)  Verbal apraxia  Dysphagia, oropharyngeal phase  Cognitive communication deficit  Rationale for Evaluation and Treatment Rehabilitation  SUBJECTIVE:  SUBJECTIVE STATEMENT: [name] reports that pt has been walking on stable surface ground and on grass. Increased bil shoulder pain 10/10 localized to shoulder.  Pt accompanied by: self and [name] (DTR)  PAIN today:  Are you having pain? Yes: NPRS scale: 10/10 Pain location: bil shoulders, achey/dull, sharp  PERTINENT HISTORY:Per H and P from 7/17 hospital d/c Pt  is a 76 year old male with history of left-sided carotid stenosis, non-insulin-dependent diabetes mellitus type 2, PAD, GERD, mild COPD, history of tobacco use, hyperlipidemia, hypertension, who presents emergency department for chief concerns of facial droop, expressive aphasia, and R sided weakness  PAIN:  Are you having pain? Yes: NPRS scale: 9/10 Pain location: B shoulders, with R being primary complaint  PRECAUTIONS: Fall  WEIGHT BEARING RESTRICTIONS No  FALLS: Has patient fallen in last 6 months? Yes. Number of falls 1  LIVING ENVIRONMENT: Lives with: lives with their family and lived alone prior to onset  Lives in: House/apartment Stairs: Yes:  External: 4 steps; can reach both Has following equipment at home: Walker - 2 wheeled and shower chair  PLOF: Independent  PATIENT GOALS become independent with everyday activities like he was prior to the onset of the stroke   OBJECTIVE:   DIAGNOSTIC FINDINGS: 1. 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 No associated hemorrhage or significant regional mass effect.  2. Loss of normal flow voids throughout the left ICA and MCA,  consistent with slow flow and/or occlusion. Susceptibility artifact  within proximal left MCA branches consistent with intraluminal  thrombus.  3. Underlying mild chronic microvascular ischemic disease. Tiny  remote left ACA distribution infarct.     MRA HEAD IMPRESSION:   "1. Interval near complete occlusion of the left ICA and MCA,  presumably acute in nature given the presence of the acute left MCA  territory infarct. Left A1 segment not well seen either, also likely  occluded.  2. Otherwise wide patency of the major intracranial arterial  circulation. No other hemodynamically significant or correctable  stenosis.     MRA NECK IMPRESSION:  1. 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 Some attenuated flow seen  distally within the cervical left ICA, with subsequent reocclusion  by the skull base.  2. 50% atheromatous stenosis at the origin of the cervical right  ICA. Otherwise wide patency of the right carotid artery system.  3. Wide patency of the vertebral arteries within the neck.".    COGNITION: Overall cognitive status: Within functional limits for tasks assessed and subjective history limited secondary to aphasia but able to answer 1 word questions appropriately.    SENSATION: WFL  COORDINATION: WNL with heel to shin testing    LOWER EXTREMITY ROM:   AROM WNL    LOWER EXTREMITY MMT:    MMT Right Eval Left Eval  Hip flexion 4 4+  Hip extension    Hip abduction 4 5   Hip adduction 4 5  Hip internal rotation    Hip external rotation    Knee flexion 4 5  Knee extension 4 5  Ankle dorsiflexion 4+ 5  Ankle plantarflexion 4+ 5  Ankle inversion 5 5  Ankle eversion 5 5  (Blank rows = not tested)   TRANSFERS: Assistive device utilized: Environmental consultant - 2 wheeled  Sit to stand: Modified independence Stand to sit: Complete Independence Chair to chair: SBA  STAIRS:  Level of Assistance: SBA Stair Negotiation Technique: Step to  Pattern with Bilateral Rails Number of Stairs: 4  Height of Stairs: 6in  Comments: Right lower extremity foot catches when a sending steps and patient performs descending steps utilizing right lower extremity for more support.  Patient reports he knows he is doing this "the hard way"  GAIT: Gait pattern: decreased arm swing- Right and decreased step length- Right Distance walked: 10 M Assistive device utilized: Environmental consultant - 2 wheeled and None Level of assistance: Modified independence Comments: difficulty with turns and object navigation per DGI tesitng, able to walk without AD but only in controlled environment without obstacles or significant challenge  FUNCTIONAL TESTs:  5 times sit to stand: 16.95 sec no UE assist 10 meter walk test: .76 m/s without AD  Berg Balance Scale: 44/56 DGI:10  Progress Note 10/10: 5x STS: 13sec no UE support DGI:14/24 BERG: 50/56 10 meter walk test: .09 m/s without AD (normal speed)   PATIENT SURVEYS:  FOTO to retest next session, likely with either asking patient questions or having caregiver complete patient completed but had 97% indicating he could run and jump with these and when questioned about this following patient reports he cannot run or jump with these and would benefit from filling this out again next time.  12/04/2021: 49 (goal 62) 01/02/2022: 92 (goal 62)    TODAY'S TREATMENT:  Interventions below held today for focus on outcome measures to assess progress  Gait belt donned,  CGA provided unless noted otherwise  NMR: Seated BP prior to session 148/93 mmHg  HR 90 bpm  Obstacle course: x4 reps  - NBOS with EC, x15 sec  - stepping over 1/2 foam roll alt leading with RLE vs. LLE  - stepping over hedgehogs for proper step length  16ft ambulation in hallway without AD:  - backwards ambulation length of hallway x2  - side stepping with serial counting length of hallway x2  - high knees length of hallway x2  Standing on airex pad:  - NBOS tic-tac-toe with RUE  - NBOS reaching outside BOS, moving colored letters into same colored box (alternating UE's), multiple attempts per UE  - staggered stance with RLE on airex and LLE on ground, matching colored letters to colored dots, then moving colored letters into same colored box (LUE), x59min   PATIENT EDUCATION: Education details: Pt educated throughout session about proper posture and technique with exercises. Improved exercise technique, movement at target joints, use of target muscles after min to mod verbal, visual, tactile cues.   Person educated: Patient Education method: Explanation, Demonstration, Tactile cues, and Verbal cues Education comprehension: verbalized understanding, returned demonstration, verbal cues required, tactile cues required, and needs further education   HOME EXERCISE PROGRAM:  No updates today, pt to continue as previously given  9/11: Pt to continue HEP as previously given. However, PT did provide education to use a chair with arm-rests for lateral support when performing STS  10/3: added standing hip ABD/ext with UE support on counter  Access Code: [email or web address] URL: [email or web address] Date: 11/24/2021 Prepared by: Rivka Barbara  Exercises - Side Stepping with Counter Support  - 1 x daily - 7 x weekly - 2 sets - 10 reps - Standing Tandem Balance with Counter Support  - 1 x daily - 7 x weekly - 3 sets - 30 second hold - Standing March with Counter Support  - 1 x  daily - 7 x weekly - 3 sets - 10 reps - Sit to Stand Without Arm Support  - 1 x daily -  7 x weekly - 2 sets - 10 reps    GOALS: Goals reviewed with patient? Yes  SHORT TERM GOALS: Target date: 12/22/2021   Patient will be independent in home exercise program to improve strength/mobility for better functional independence with ADLs. Baseline: No HEP currently, (10/10) not compliant Goal status: INITIAL    LONG TERM GOALS: Target date: 02/16/2022   1.  Patient (> 85 years old) will complete five times sit to stand test in < 15 seconds indicating an increased LE strength and improved balance. Baseline: 16.95 01/02/22: 13 sec Goal status: MET  2.  Patient will increase FOTO score to equal to or greater than  60   to demonstrate statistically significant improvement in mobility and quality of life.  Baseline: Test next session 01/02/22: 92 Goal status: MET   3.  Patient will increase Berg Balance score by > 6 points to demonstrate decreased fall risk during functional activities. Baseline: 44 01/02/22: 50/56 Goal status: MET   4.  Patient will improve DGI by 5 points or more to reduce fall risk and demonstrate improved gait ability. Baseline: 10 01/02/22: 14 Goal status: PROGRESSING  5.  Patient will increase 10 meter walk test to >1.65m/s as to improve gait speed for better community ambulation and to reduce fall risk. Baseline: .27m/s 01/02/22: 0.9 m/s Goal status: PROGRESSING  6.  Patient will increase Berg Balance score by > 6 points to demonstrate decreased fall risk during functional activities. Baseline: 44 01/02/22: 50/56 Goal status: PROGRESSING  7. FGA goal      ASSESSMENT:  CLINICAL IMPRESSION:  10th visit progress note performed today. Pt meeting most goals and making adequate progress towards other goals. Goals have been extended/updated as appropriate. Pt has demonstrated overall improvements in standing static/dynamic balance, strength, safety, and  endurance. However, pt continues to demonstrate impairments with multidirectional head turns, endurance, and ambulation without AD. Pt continues to score low with standing balance tasks including: 180-360deg turns, multidirectional head turns, reaching outside BOS, and gait speed. Deficits currently increase the pt's risk of falls, and decrease his overall Ind with functional mobility. Pt will benefit from FGA outcome measure, to assess for community deficits. Pt will continue to benefit from skilled OPPT services to address deficits for improved safety, Ind, and QoL.    OBJECTIVE IMPAIRMENTS Abnormal gait, cardiopulmonary status limiting activity, decreased activity tolerance, decreased balance, decreased endurance, decreased mobility, difficulty walking, decreased strength, and decreased safety awareness.   ACTIVITY LIMITATIONS lifting, bending, standing, squatting, stairs, transfers, and locomotion level  PARTICIPATION LIMITATIONS: meal prep, shopping, and community activity  PERSONAL FACTORS Age and 3+ comorbidities: left-sided carotid stenosis, non-insulin-dependent diabetes mellitus type 2, PAD, GERD, mild COPD, history of tobacco use, hyperlipidemia, hypertension  are also affecting patient's functional outcome.   REHAB POTENTIAL: Good  CLINICAL DECISION MAKING: Evolving/moderate complexity  EVALUATION COMPLEXITY: Moderate  PLAN: PT FREQUENCY: 2x/week  PT DURATION: 12 weeks  PLANNED INTERVENTIONS: Therapeutic exercises, Therapeutic activity, Neuromuscular re-education, Balance training, Gait training, Patient/Family education, Self Care, Joint mobilization, Stair training, Moist heat, and Manual therapy  PLAN FOR NEXT SESSION: FGA test, continue with functional/dynamic balance interventions   Herminio Commons, PT, DPT 12:52 PM,01/02/22 Physical Therapist - [facility]

## 2022-01-03 NOTE — Therapy (Signed)
OUTPATIENT SPEECH LANGUAGE PATHOLOGY TREATMENT NOTE   Patient Name: Gerald Bauer MRN: [id number] DOB:[birth year], 76 y.o., male Today's Date: 01/03/2022  PCP: [name], PA-C REFERRING PROVIDER: Reesa Chew, PA-C  END OF SESSION:   End of Session - 01/03/22 0912     Visit Number 14    Number of Visits 25    Date for SLP Re-Evaluation 02/04/22    SLP Start Time 0900    SLP Stop Time  1000    SLP Time Calculation (min) 60 min    Activity Tolerance Patient tolerated treatment well             Past Medical History:  Diagnosis Date   Acid reflux    Arthritis    hands   Carotid atherosclerosis    Diabetes mellitus without complication ([place])    Ganglion cyst 02/23/2015   Hyperlipemia    Hypertension    Joint pain in fingers of right hand 02/23/2015   Dominant hand   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 07/11/2015   Prostate cancer ([place])    history   Stroke (cerebrum) ([place]) 10/06/2021   Tachycardia    Tinea pedis of both feet 01/10/2017   Past Surgical History:  Procedure Laterality Date   [place]  2013   PROSTATECTOMY  2012   Patient Active Problem List   Diagnosis Date Noted   Hemiparesis, aphasia, and dysphagia as late effects of stroke ([place]) 11/07/2021   Lower urinary tract symptoms (LUTS) 11/07/2021   Myalgia due to statin 02/21/2021   Type 2 diabetes mellitus without complication, without long-term current use of insulin ([place]) 02/21/2021   Aortic atherosclerosis ([place]) 04/27/2020   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   Onychomycosis of multiple toenails with type 2 diabetes mellitus ([place]) 07/19/2016   Carotid stenosis 03/13/2016   Hyperlipidemia associated with type 2 diabetes mellitus ([place])    Hypertension associated with type 2 diabetes mellitus ([place])    Personal history of prostate cancer    Carotid atherosclerosis    GERD (gastroesophageal reflux disease)  08/20/2013    ONSET DATE: 10/05/2021    REFERRING DIAG: Cerebral infarction due to unspecified occlusion of left middle cerebral artery  THERAPY DIAG:  Verbal apraxia  Aphasia Verbal apraxia  Rationale for Evaluation and Treatment Rehabilitation  SUBJECTIVE STATEMENT:             Patient arrived pleasant and motivated for therapy   Pt accompanied by: self  PERTINENT HISTORY: Pt  is a 76 year old male with history of left-sided carotid stenosis, non-insulin-dependent diabetes mellitus type 2, PAD, GERD, mild COPD, history of tobacco use, hyperlipidemia, hypertension, who presented 10/05/21 to [facility] ED with facial droop, expressive aphasia, and R sided weakness and found to have left MCA CVA. He was admitted to [facility] 7/17-10/31/21. Advanced to dysphagia 3/thin liquids by cup by time of d/c from CIR. 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 Difficult to fully assess cognition given severity of linguistic impairment; however, does present with decreased safety awareness, diminished problem-solving, emergent awareness, and mild impulsivity with movement."     DIAGNOSTIC FINDINGS: MBS 10/19/21: "Pt presents with overall mild oropharyngeal dysphagia. Oral phase c/b piecemeal deglutition, decreased mastication, decreased bolus cohesion, premature spillage, and weak lingual manipulation. Pharyngeal phase c/b reduced pharyngeal peristalsis + reduced BOT approximation to PPW, which results in mild  to moderate vallecular and pyriform sinuses residuals (vallecula > pyriform sinuses for solids, pyriform sinuses > vallecula for liquids). No penetration nor aspiration appreciated with thin liquid via cup, Dysphagia 1 (puree), Dysphagia 2 (chopped), Dysphagia 3 (mechanical soft), or with 13 mm Barium tablet placed in puree. Once instance of sensed aspiration with intake of large bolus of thin liquid via straw due to  swallow initiation delay to pyriform sinuses and mistiming of swallow-breath cycle; despite strong reflexive cough aspirant was not fully cleared from trachea. Pt's level of swallow initiation was noted to vary with liquid consumption (over base of epiglottis and at pyriform sinuses) with level of swallow initiation for solids to be consistent at the vallecula. Pharyngeal stasis was partially cleared with reflexive secondary swallow." MRI brain 10/05/21: 1. 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 No associated hemorrhage or significant regional mass effect. 2. Loss of normal flow voids throughout the left ICA and MCA, consistent with slow flow and/or occlusion. 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 3. Underlying mild chronic microvascular ischemic disease. Tiny remote left ACA distribution infarct.  PAIN:  Are you having pain? No  PATIENT GOALS talk better   OBJECTIVE:    TODAY'S TREATMENT:  Session focused on expressive language goals and multimodal communication, with SLP using VNEST protocol to facilitate pt generation of sentences. Pt assisted in selecting verbs from word list, then generated 3 personally relevant sentences for each verb (Subject, verb, object) with occasional moderate cues necessary for objects. Pt then generated expansion of sentences by adding where/when/how/why with mod cues. Pt read sentence aloud using tapping/visual cues to segment syllables, 70% acc.     PATIENT EDUCATION: Education details: see today's treatment for details Person educated: Patient Education method: Explanation, Demonstration, and written supports Education comprehension: verbalized understanding and needs further education     GOALS: Goals reviewed with patient? Yes   SHORT TERM GOALS: Target date: 10 sessions   Patient will participate in clinical assessment of swallow function with goals added as  needed. Baseline: Goal status: MET   2.  Pt will communicate emergency information 100% accuracy using visual aid if necessary.  Baseline:  Goal status: IN PROGRESS (partially met, will continue; 70% intelligible with mod-max cues)   3.  Pt will approximate personally relevant words and phrases >80% accuracy using script training and min visual cues for apraxia.   Baseline:  Goal status: MET   4.  Pt will generate at least 4 descriptors of target word 80% of the time using semantic feature analysis to improve abilities in wordfinding and resolving communication breakdowns.  Baseline:  Goal status: MET   5.  Pt will complete HEP for dysphagia with rare min A. Baseline:  Goal status: MET   6.  Pt will generate sentences using personally relevant words list for >80% intelligibility. Baseline: 50% intelligibility Goal status: INITIAL   5.  Pt will improve expressive language skills by taking 4-6 turns in simple conversation, with supported conversation strategies if needed. Baseline:  Goal status: INITIAL       LONG TERM GOALS: Target date: 02/04/2022   Pt will engage in 5-8 minutes simple-mod complex conversation re: topic of interest with supported conversation, aphasia compensations.  Baseline:  Goal status: IN PROGRESS   2.  Pt will ID and attempt repair of communication breakdowns >90% of the time in session.    Baseline:  Goal status: IN PROGRESS   3.  Pt/family will demonstrate  knowledge of community resources and activities to support language/communication. Baseline:  Goal status: IN PROGRESS   4.  Pt will demonstrate use of swallowing precautions independently with s/sx aspiration <5% of trials. Baseline:  Goal status: IN PROGRESS     ASSESSMENT:   CLINICAL IMPRESSION: Patient presents with moderate Broca's aphasia, verbal apraxia, and dysarthria (difficult to determine extent due to language deficits and apraxia). Pt/family report pt consuming regular  solids and thin liquids at home now without difficulty. With encouragement, extended time and mod cues, pt able to repair of breakdowns using multimodal means including alphabet board, writing at word level (agrammatism present), drawing, and description, although extended time and mod cues required. Tapping hand while segmenting words/syllables highly effective to slow rate and improve productions. Patient is initiating use of compensations more frequently without cues, and initiates attempts at repair of breakdowns. Patient demonstrates good use of nonverbals and Y/N responses, however is significantly limited in his ability to relay thoughts, feelings, and non-contextual or abstract information. Pt appears motivated and is demonstrating excellent progress toward goals. I recommend skilled ST to improve pt's swallowing, language and motor speech skills to increase swallowing safety, improve communication and reduce frustration.    OBJECTIVE IMPAIRMENTS include expressive language, receptive language, aphasia, apraxia, dysarthria, and dysphagia. These impairments are limiting patient from managing medications, managing appointments, managing finances, household responsibilities, ADLs/IADLs, effectively communicating at home and in community, and safety when swallowing. Factors affecting potential to achieve goals and functional outcome are cooperation/participation level; pt appears highly motivated with good family support. Patient will benefit from skilled SLP services to address above impairments and improve overall function.   REHAB POTENTIAL: Excellent   PLAN: SLP FREQUENCY: 2x/week   SLP DURATION: 12 weeks   PLANNED INTERVENTIONS: Aspiration precaution training, Pharyngeal strengthening exercises, Diet toleration management , Language facilitation, Environmental controls, Trials of upgraded texture/liquids, Cognitive reorganization, Internal/external aids, Functional tasks, Multimodal  communication approach, SLP instruction and feedback, Compensatory strategies, and Patient/family education   [name], MS, CCC-SLP Speech-Language Pathologist [phone]   [name], [place] 01/03/2022, 9:34 AM  [facility] [address] [place], [place], [address] Phone: [phone]   Fax:  [phone]

## 2022-01-04 ENCOUNTER — Encounter: Payer: Self-pay | Admitting: Occupational Therapy

## 2022-01-04 ENCOUNTER — Ambulatory Visit: Payer: Medicare Other | Admitting: Speech Pathology

## 2022-01-04 ENCOUNTER — Ambulatory Visit: Payer: Medicare Other | Admitting: Occupational Therapy

## 2022-01-04 DIAGNOSIS — R482 Apraxia: Secondary | ICD-10-CM

## 2022-01-04 DIAGNOSIS — R4701 Aphasia: Secondary | ICD-10-CM

## 2022-01-04 DIAGNOSIS — R278 Other lack of coordination: Secondary | ICD-10-CM

## 2022-01-04 DIAGNOSIS — M6281 Muscle weakness (generalized): Secondary | ICD-10-CM

## 2022-01-04 NOTE — Therapy (Signed)
OUTPATIENT SPEECH LANGUAGE PATHOLOGY TREATMENT NOTE   Patient Name: Gerald Bauer MRN: [id number] DOB:07-Mar-1946, 76 y.o., male Today's Date: 01/04/2022  PCP: [name], PA-C REFERRING PROVIDER: Reesa Chew, PA-C  END OF SESSION:   End of Session - 01/04/22 1541     Visit Number 15    Number of Visits 25    Date for SLP Re-Evaluation 02/04/22    SLP Start Time 47    SLP Stop Time  1400    SLP Time Calculation (min) 60 min    Activity Tolerance Patient tolerated treatment well             Past Medical History:  Diagnosis Date   Acid reflux    Arthritis    hands   Carotid atherosclerosis    Diabetes mellitus without complication ([place])    Ganglion cyst 02/23/2015   Hyperlipemia    Hypertension    Joint pain in fingers of right hand 02/23/2015   Dominant hand   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 07/11/2015   Prostate cancer ([place])    history   Stroke (cerebrum) ([place]) 10/06/2021   Tachycardia    Tinea pedis of both feet 01/10/2017   Past Surgical History:  Procedure Laterality Date   [place]  2013   PROSTATECTOMY  2012   Patient Active Problem List   Diagnosis Date Noted   Hemiparesis, aphasia, and dysphagia as late effects of stroke ([place]) 11/07/2021   Lower urinary tract symptoms (LUTS) 11/07/2021   Myalgia due to statin 02/21/2021   Type 2 diabetes mellitus without complication, without long-term current use of insulin ([place]) 02/21/2021   Aortic atherosclerosis ([place]) 04/27/2020   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   Onychomycosis of multiple toenails with type 2 diabetes mellitus ([place]) 07/19/2016   Carotid stenosis 03/13/2016   Hyperlipidemia associated with type 2 diabetes mellitus ([place])    Hypertension associated with type 2 diabetes mellitus ([place])    Personal history of prostate cancer    Carotid atherosclerosis    GERD (gastroesophageal reflux disease)  08/20/2013    ONSET DATE: 10/05/2021    REFERRING DIAG: Cerebral infarction due to unspecified occlusion of left middle cerebral artery  THERAPY DIAG:  Verbal apraxia  Aphasia Verbal apraxia  Rationale for Evaluation and Treatment Rehabilitation  SUBJECTIVE STATEMENT:             "All right."   Pt accompanied by: self  PERTINENT HISTORY: Pt  is a 76 year old male with history of left-sided carotid stenosis, non-insulin-dependent diabetes mellitus type 2, PAD, GERD, mild COPD, history of tobacco use, hyperlipidemia, hypertension, who presented 10/05/21 to [facility] ED with facial droop, expressive aphasia, and R sided weakness and found to have left MCA CVA. He was admitted to [facility] 7/17-10/31/21. Advanced to dysphagia 3/thin liquids by cup by time of d/c from CIR. 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 Difficult to fully assess cognition given severity of linguistic impairment; however, does present with decreased safety awareness, diminished problem-solving, emergent awareness, and mild impulsivity with movement."     DIAGNOSTIC FINDINGS: MBS 10/19/21: "Pt presents with overall mild oropharyngeal dysphagia. Oral phase c/b piecemeal deglutition, decreased mastication, decreased bolus cohesion, premature spillage, and weak lingual manipulation. Pharyngeal phase c/b reduced pharyngeal peristalsis + reduced BOT approximation to PPW, which results in mild to moderate vallecular and pyriform  sinuses residuals (vallecula > pyriform sinuses for solids, pyriform sinuses > vallecula for liquids). No penetration nor aspiration appreciated with thin liquid via cup, Dysphagia 1 (puree), Dysphagia 2 (chopped), Dysphagia 3 (mechanical soft), or with 13 mm Barium tablet placed in puree. Once instance of sensed aspiration with intake of large bolus of thin liquid via straw due to swallow initiation delay to pyriform  sinuses and mistiming of swallow-breath cycle; despite strong reflexive cough aspirant was not fully cleared from trachea. Pt's level of swallow initiation was noted to vary with liquid consumption (over base of epiglottis and at pyriform sinuses) with level of swallow initiation for solids to be consistent at the vallecula. Pharyngeal stasis was partially cleared with reflexive secondary swallow." MRI brain 10/05/21: 1. 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 No associated hemorrhage or significant regional mass effect. 2. Loss of normal flow voids throughout the left ICA and MCA, consistent with slow flow and/or occlusion. 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 3. Underlying mild chronic microvascular ischemic disease. Tiny remote left ACA distribution infarct.  PAIN:  Are you having pain? No  PATIENT GOALS talk better   OBJECTIVE:    TODAY'S TREATMENT:  Patient made attempts to slow rate as he communicated that he had some difficulty with his homework. SLP worked with pt and provided assistance for skipped items. Targeted apraxia of speech using slow rate, tactile cues (tapping) during sentence completion task (100% level 1, 90% level 2, with min cues for slowing rate, repetition). During categorization task, targeted expressive language (sentence generation, categorization) as well as verbal apraxia. Pt read aloud mod complex category items and category choices 80% acc with mod cues, and selected odd one out 90% acc. Pt generated sentence level responses with simple Q & A with extended time, 80% acc for language, with moderate cues necessary for slowing rate for intelligibility (70%).     PATIENT EDUCATION: Education details: see today's treatment for details Person educated: Patient Education method: Explanation, Demonstration, and written supports Education comprehension: verbalized understanding and needs  further education     GOALS: Goals reviewed with patient? Yes   SHORT TERM GOALS: Target date: 10 sessions   Patient will participate in clinical assessment of swallow function with goals added as needed. Baseline: Goal status: MET   2.  Pt will communicate emergency information 100% accuracy using visual aid if necessary.  Baseline:  Goal status: IN PROGRESS (partially met, will continue; 70% intelligible with mod-max cues)   3.  Pt will approximate personally relevant words and phrases >80% accuracy using script training and min visual cues for apraxia.   Baseline:  Goal status: MET   4.  Pt will generate at least 4 descriptors of target word 80% of the time using semantic feature analysis to improve abilities in wordfinding and resolving communication breakdowns.  Baseline:  Goal status: MET   5.  Pt will complete HEP for dysphagia with rare min A. Baseline:  Goal status: MET   6.  Pt will generate sentences using personally relevant words list for >80% intelligibility. Baseline: 50% intelligibility Goal status: INITIAL   5.  Pt will improve expressive language skills by taking 4-6 turns in simple conversation, with supported conversation strategies if needed. Baseline:  Goal status: INITIAL       LONG TERM GOALS: Target date: 02/04/2022   Pt will engage in 5-8 minutes simple-mod complex conversation re: topic of interest with supported conversation, aphasia compensations.  Baseline:  Goal status: IN PROGRESS   2.  Pt will ID and attempt repair of communication breakdowns >90% of the time in session.    Baseline:  Goal status: IN PROGRESS   3.  Pt/family will demonstrate knowledge of community resources and activities to support language/communication. Baseline:  Goal status: IN PROGRESS   4.  Pt will demonstrate use of swallowing precautions independently with s/sx aspiration <5% of trials. Baseline:  Goal status: IN PROGRESS     ASSESSMENT:   CLINICAL  IMPRESSION: Patient presents with moderate Broca's aphasia, verbal apraxia, and dysarthria (difficult to determine extent due to language deficits and apraxia). Pt/family report pt consuming regular solids and thin liquids at home now without difficulty. With encouragement, extended time and mod cues, pt able to repair of breakdowns using multimodal means including alphabet board, writing at word level (agrammatism present), drawing, and description, although extended time and mod cues required. Tapping hand while segmenting words/syllables highly effective to slow rate and improve productions. Patient is initiating use of compensations more frequently without cues, and initiates attempts at repair of breakdowns. Patient demonstrates good use of nonverbals and Y/N responses, however is significantly limited in his ability to relay thoughts, feelings, and non-contextual or abstract information. Pt appears motivated and is demonstrating excellent progress toward goals. I recommend skilled ST to improve pt's swallowing, language and motor speech skills to increase swallowing safety, improve communication and reduce frustration.    OBJECTIVE IMPAIRMENTS include expressive language, receptive language, aphasia, apraxia, dysarthria, and dysphagia. These impairments are limiting patient from managing medications, managing appointments, managing finances, household responsibilities, ADLs/IADLs, effectively communicating at home and in community, and safety when swallowing. Factors affecting potential to achieve goals and functional outcome are cooperation/participation level; pt appears highly motivated with good family support. Patient will benefit from skilled SLP services to address above impairments and improve overall function.   REHAB POTENTIAL: Excellent   PLAN: SLP FREQUENCY: 2x/week   SLP DURATION: 12 weeks   PLANNED INTERVENTIONS: Aspiration precaution training, Pharyngeal strengthening exercises,  Diet toleration management , Language facilitation, Environmental controls, Trials of upgraded texture/liquids, Cognitive reorganization, Internal/external aids, Functional tasks, Multimodal communication approach, SLP instruction and feedback, Compensatory strategies, and Patient/family education   [name], MS, CCC-SLP Speech-Language Pathologist [phone]   [name], [place] 01/04/2022, 3:42 PM  [facility] [address] [place], [place], [address] Phone: [phone]   Fax:  [phone]

## 2022-01-04 NOTE — Therapy (Signed)
OUTPATIENT OCCUPATIONAL THERAPY NEURO TREATMENT  Patient Name: Gerald Bauer MRN: [id number] DOB:February 21, 1946, 76 y.o., male Today's Date: 01/04/2022  PCP: Threasa Alpha, PA REFERRING PROVIDER: Reesa Chew   OT End of Session - 01/04/22 1701     Visit Number 17    Number of Visits 24    Authorization Time Period Reporting period beginning 12/11/21    OT Start Time 1150    OT Stop Time 63    OT Time Calculation (min) 40 min    Activity Tolerance Patient tolerated treatment well    Behavior During Therapy [facility] for tasks assessed/performed             Past Medical History:  Diagnosis Date   Acid reflux    Arthritis    hands   Carotid atherosclerosis    Diabetes mellitus without complication ([place])    Ganglion cyst 02/23/2015   Hyperlipemia    Hypertension    Joint pain in fingers of right hand 02/23/2015   Dominant hand   Left middle cerebral artery stroke ([employer]) 10/09/2021   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 07/11/2015   Prostate cancer ([place])    history   Stroke (cerebrum) ([place]) 10/06/2021   Tachycardia    Tinea pedis of both feet 01/10/2017   Past Surgical History:  Procedure Laterality Date   [place]  2013   PROSTATECTOMY  2012   Patient Active Problem List   Diagnosis Date Noted   Hemiparesis, aphasia, and dysphagia as late effects of stroke ([place]) 11/07/2021   Lower urinary tract symptoms (LUTS) 11/07/2021   Myalgia due to statin 02/21/2021   Type 2 diabetes mellitus without complication, without long-term current use of insulin ([place]) 02/21/2021   Aortic atherosclerosis ([place]) 04/27/2020   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   Onychomycosis of multiple toenails with type 2 diabetes mellitus ([place]) 07/19/2016   Carotid stenosis 03/13/2016   Hyperlipidemia associated with type 2 diabetes mellitus ([place])    Hypertension associated with type 2 diabetes mellitus ([place])    Personal history of prostate cancer    Carotid atherosclerosis     GERD (gastroesophageal reflux disease) 08/20/2013    ONSET DATE: 10/05/21  REFERRING DIAG: L MCA CVA  THERAPY DIAG:  Muscle weakness (generalized)  Other lack of coordination  Rationale for Evaluation and Treatment Rehabilitation  SUBJECTIVE:    SUBJECTIVE STATEMENT:   Pt. Reports no pain   PERTINENT HISTORY: Per chart, Gerald Bauer is a 76 year old right-handed male with history of hypertension, hyperlipidemia, diabetes mellitus, prostate cancer/prostatectomy 2012, quit smoking 24 years ago.  Presented to [facility] 10/05/2021 with right side weakness/facial droop and expressive aphasia.  Blood pressure 155/102.  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  No associated hemorrhage or significant mass effect.    PAIN:  Are you having pain?  Patient reports no pain initially, however reported some left shoulder soreness following the UBE.  FALLS: Has patient fallen in last 6 months? Yes. Number of falls 3 since CVA  PLOF: Independent/retired Dealer  PATIENT GOALS : Pt points to his hand (wants to be able to use his hand)  OBJECTIVE:   HAND DOMINANCE: Right   FUNCTIONAL OUTCOME MEASURES: FOTO: 48  UUPPER EXTREMITY ROM      Active ROM Right eval Left Eval WNL Right 12/11/21  Shoulder flexion 90 (135)   95 (110)  Shoulder abduction 95 (110)   80 (90)  Wrist flex  54  Wrist ext     41    UPPER EXTREMITY MMT:      MMT Right eval Left Eval 5/5 Right 12/11/21  Shoulder flexion 3-   -3  Shoulder abduction 3-   -3  Shoulder adduction        Shoulder extension        Shoulder internal rotation 3-      Shoulder external rotation 3-      Middle trapezius        Lower trapezius        Elbow flexion 4-   4  Elbow extension 4+   5  Wrist flexion 4   NT d/t pain  Wrist extension 4-   NT d/t pain  (Blank rows = not tested)   HAND FUNCTION: Eval:         Grip strength: Right: 14 lbs; Left: 58 lbs, Lateral pinch: Right: 7  lbs, Left: 19 lbs, and 3 point pinch: Right: 2 lbs, Left: 16 lbs 10th visit: Grip strength: Right: 16  lbs;                                Lateral pinch: Right: 9  lbs;                            3 point pinch: Right: 5 lbs   Eval: COORDINATION: Finger Nose Finger test: difficult/lacks precision with reaching targets 9 Hole Peg test: Right: unable sec; Left: 27 sec Able to oppose 2nd and 3rd digits to thumb on R hand   10th visit 9 hole Peg test: Right: 11 min, 44 sec    TODAY'S TREATMENT:   Therapeutic Exercise:  Pt. Worked on BUE strengthening, and reciprocal motion using the UBE while seated for 8 min. with no resistance. Constant monitoring was provided.   neuromuscular re-education:  Patient worked on grasping and storing 1 inch circular pegs and translatory movements storing them in palm of his hand and moving them from his palm out to the tip of his second digit and thumb in preparation for placing them in a pegboard position flat at the tabletop surface, patient then worked on using flat marbles to work on starting them in his hand and transitory movements.         PATIENT EDUCATION: Education details: HEP progression Person educated: pt Education method: Explanation and Verbal cues, handout Education comprehension: verbalized understanding, returned demo   HOME EXERCISE PROGRAM: theraputty  GOALS: Goals reviewed with patient? Yes   SHORT TERM GOALS: Target date: 12/18/21   Pt will be indep to perform HEP for increasing strength and coordination throughout RUE.  Baseline: Not yet initiated; 10th: putty given but pt reports limited use, instructed in table slides and self PROM for R wrist/digits.   Goal status: ongoing   2.  Pt will manage clothing fasteners with extra time, using RUE as an assist  Baseline: unable; pt has elastic laces and wears elastic waisted pants; 10th: pt uses R hand as an assist to zip and button jeans with extra time; not yet tried small  buttons on a shirt.  Pt continues to use elastic laces on shoes. Goal status: ongoing   LONG TERM GOALS: Target date: 01/29/22   Pt will increase FOTO score to 55 or better to indicate improved performance with daily tasks.  Baseline: 48; 10th: 48 Goal status:  ongoing   2.  Pt will increase R grip strength by 10 or more lbs to enable pt to hold and carry light ADL supplies without dropping.  Baseline: R grip 14#, L 58#; 10th visit: R grip 16 Goal status: INITIAL   3.  Pt will increase RUE strength to be able to engage RUE into ADLs at least 50% of the time. Baseline: Pt using L non-dominant arm to manage ADLs; 10th visit: Son reports he constantly sees pt attempt to use his R arm for daily tasks, but continues to be limited and still must use LUE for most tasks (see chart for RUE MMT) Goal status: ongoing   4.  Pt will complete 9 hole peg test on the R in 3 min or less to work towards ability to use R hand to pick up small ADL supplies.  Baseline: Pt can remove a peg but can not pick one up; 10th visit: R 9 hole completion 11 min 44 sec Goal status: ongoing    ASSESSMENT:  CLINICAL IMPRESSION:   Patient pain patient reported less pain today in his right upper extremity .Pt. required consistent verbal cues, and cues for visual demonstration of proper technique, and for movement patterns for translatory movements of the right hand. Pt. Required increased time to engage his right thumb during the translatory movements of the hand, however requires less cueing today during each task. Patient continues to work on right UE strength, grip strength, pinch strength, and fine motor coordination skills in order to improve RUE functioning, and improve engagement in, and maximize independence with ADLs and IADL tasks.  PERFORMANCE DEFICITS in functional skills including ADLs, IADLs, coordination, dexterity, ROM, strength, pain, flexibility, FMC, GMC, mobility, balance, continence, decreased knowledge  of use of DME, and UE functional use, cognitive skills including safety awareness, and psychosocial skills including.   IMPAIRMENTS are limiting patient from ADLs, IADLs, leisure, and social participation.   COMORBIDITIES may have co-morbidities  that affects occupational performance. Patient will benefit from skilled OT to address above impairments and improve overall function.  MODIFICATION OR ASSISTANCE TO COMPLETE EVALUATION: Min-Moderate modification of tasks or assist with assess necessary to complete an evaluation.  OT OCCUPATIONAL PROFILE AND HISTORY: Problem focused assessment: Including review of records relating to presenting problem.  CLINICAL DECISION MAKING: Moderate - several treatment options, min-mod task modification necessary  REHAB POTENTIAL: Good  EVALUATION COMPLEXITY: Moderate    PLAN: OT FREQUENCY: 2x/week  OT DURATION: 12 weeks  PLANNED INTERVENTIONS: self care/ADL training, therapeutic exercise, therapeutic activity, neuromuscular re-education, manual therapy, passive range of motion, balance training, functional mobility training, moist heat, cryotherapy, patient/family education, cognitive remediation/compensation, energy conservation, coping strategies training, and DME and/or AE instructions  RECOMMENDED OTHER SERVICES: N/A  CONSULTED AND AGREED WITH PLAN OF CARE: Patient and family member/caregiver  PLAN FOR NEXT SESSION: HEP progression, neuro re-ed, therapeutic exercises  Harrel Carina, MS, OTR/L   Harrel Carina, OT 01/04/2022, 5:04 PM

## 2022-01-09 ENCOUNTER — Ambulatory Visit: Payer: Medicare Other

## 2022-01-09 ENCOUNTER — Ambulatory Visit: Payer: Medicare Other | Admitting: Speech Pathology

## 2022-01-11 ENCOUNTER — Ambulatory Visit: Payer: Medicare Other | Admitting: Occupational Therapy

## 2022-01-11 ENCOUNTER — Encounter: Payer: Self-pay | Admitting: Occupational Therapy

## 2022-01-11 ENCOUNTER — Ambulatory Visit: Payer: Medicare Other | Admitting: Speech Pathology

## 2022-01-11 DIAGNOSIS — R4701 Aphasia: Secondary | ICD-10-CM

## 2022-01-11 DIAGNOSIS — R482 Apraxia: Secondary | ICD-10-CM

## 2022-01-11 NOTE — Therapy (Signed)
OUTPATIENT SPEECH LANGUAGE PATHOLOGY TREATMENT NOTE   Patient Name: Gerald Bauer MRN: [id number] DOB:[birth year], 76 y.o., male Today's Date: 01/11/2022  PCP: [name], PA-C REFERRING PROVIDER: Reesa Chew, PA-C  END OF SESSION:   End of Session - 01/11/22 1714     Visit Number 16    Number of Visits 25    Date for SLP Re-Evaluation 02/04/22    SLP Start Time 34    SLP Stop Time  1400    SLP Time Calculation (min) 60 min    Activity Tolerance Patient tolerated treatment well             Past Medical History:  Diagnosis Date   Acid reflux    Arthritis    hands   Carotid atherosclerosis    Diabetes mellitus without complication ([place])    Ganglion cyst 02/23/2015   Hyperlipemia    Hypertension    Joint pain in fingers of right hand 02/23/2015   Dominant hand   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 07/11/2015   Prostate cancer ([place])    history   Stroke (cerebrum) ([place]) 10/06/2021   Tachycardia    Tinea pedis of both feet 01/10/2017   Past Surgical History:  Procedure Laterality Date   [place]  2013   PROSTATECTOMY  2012   Patient Active Problem List   Diagnosis Date Noted   Hemiparesis, aphasia, and dysphagia as late effects of stroke ([place]) 11/07/2021   Lower urinary tract symptoms (LUTS) 11/07/2021   Myalgia due to statin 02/21/2021   Type 2 diabetes mellitus without complication, without long-term current use of insulin ([place]) 02/21/2021   Aortic atherosclerosis ([place]) 04/27/2020   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   Onychomycosis of multiple toenails with type 2 diabetes mellitus ([place]) 07/19/2016   Carotid stenosis 03/13/2016   Hyperlipidemia associated with type 2 diabetes mellitus ([place])    Hypertension associated with type 2 diabetes mellitus ([place])    Personal history of prostate cancer    Carotid atherosclerosis    GERD (gastroesophageal reflux disease)  08/20/2013    ONSET DATE: 10/05/2021    REFERRING DIAG: Cerebral infarction due to unspecified occlusion of left middle cerebral artery  THERAPY DIAG:  Verbal apraxia  Aphasia Verbal apraxia  Rationale for Evaluation and Treatment Rehabilitation  SUBJECTIVE STATEMENT:             "Natika Geyer, this- is - Har" Joneen Boers)   Pt accompanied by: self  PERTINENT HISTORY: Pt  is a 76 year old male with history of left-sided carotid stenosis, non-insulin-dependent diabetes mellitus type 2, PAD, GERD, mild COPD, history of tobacco use, hyperlipidemia, hypertension, who presented 10/05/21 to [facility] ED with facial droop, expressive aphasia, and R sided weakness and found to have left MCA CVA. He was admitted to [facility] 7/17-10/31/21. Advanced to dysphagia 3/thin liquids by cup by time of d/c from CIR. 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 Difficult to fully assess cognition given severity of linguistic impairment; however, does present with decreased safety awareness, diminished problem-solving, emergent awareness, and mild impulsivity with movement."     DIAGNOSTIC FINDINGS: MBS 10/19/21: "Pt presents with overall mild oropharyngeal dysphagia. Oral phase c/b piecemeal deglutition, decreased mastication, decreased bolus cohesion, premature spillage, and weak lingual manipulation. Pharyngeal phase c/b reduced pharyngeal peristalsis + reduced BOT approximation to PPW, which results in mild to  moderate vallecular and pyriform sinuses residuals (vallecula > pyriform sinuses for solids, pyriform sinuses > vallecula for liquids). No penetration nor aspiration appreciated with thin liquid via cup, Dysphagia 1 (puree), Dysphagia 2 (chopped), Dysphagia 3 (mechanical soft), or with 13 mm Barium tablet placed in puree. Once instance of sensed aspiration with intake of large bolus of thin liquid via straw due to swallow initiation  delay to pyriform sinuses and mistiming of swallow-breath cycle; despite strong reflexive cough aspirant was not fully cleared from trachea. Pt's level of swallow initiation was noted to vary with liquid consumption (over base of epiglottis and at pyriform sinuses) with level of swallow initiation for solids to be consistent at the vallecula. Pharyngeal stasis was partially cleared with reflexive secondary swallow." MRI brain 10/05/21: 1. 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 No associated hemorrhage or significant regional mass effect. 2. Loss of normal flow voids throughout the left ICA and MCA, consistent with slow flow and/or occlusion. 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 3. Underlying mild chronic microvascular ischemic disease. Tiny remote left ACA distribution infarct.  PAIN:  Are you having pain? No  PATIENT GOALS talk better   OBJECTIVE:    TODAY'S TREATMENT:  SLP facilitated use of compensations for apraxia/intelligibility with barrier task; pt selected words from his personally relevant lists and read to SLP; intelligibility was 80% on first attempt, 100% with second attempts. Led simple discourse re: pt's wife, brother, and upbringing, with pt initiating use of slower rate and attempting correction of errors ~75% of the time. Occasional min-mod cues necessary to write keyword when slowing rate was not effective in repairing breakdown.     PATIENT EDUCATION: Education details: see today's treatment for details Person educated: Patient Education method: Explanation, Demonstration, and written supports Education comprehension: verbalized understanding and needs further education     GOALS: Goals reviewed with patient? Yes   SHORT TERM GOALS: Target date: 10 sessions   Patient will participate in clinical assessment of swallow function with goals added as needed. Baseline: Goal status:  MET   2.  Pt will communicate emergency information 100% accuracy using visual aid if necessary.  Baseline:  Goal status: IN PROGRESS (partially met, will continue; 70% intelligible with mod-max cues)   3.  Pt will approximate personally relevant words and phrases >80% accuracy using script training and min visual cues for apraxia.   Baseline:  Goal status: MET   4.  Pt will generate at least 4 descriptors of target word 80% of the time using semantic feature analysis to improve abilities in wordfinding and resolving communication breakdowns.  Baseline:  Goal status: MET   5.  Pt will complete HEP for dysphagia with rare min A. Baseline:  Goal status: MET   6.  Pt will generate sentences using personally relevant words list for >80% intelligibility. Baseline: 50% intelligibility Goal status: INITIAL   5.  Pt will improve expressive language skills by taking 4-6 turns in simple conversation, with supported conversation strategies if needed. Baseline:  Goal status: INITIAL       LONG TERM GOALS: Target date: 02/04/2022   Pt will engage in 5-8 minutes simple-mod complex conversation re: topic of interest with supported conversation, aphasia compensations.  Baseline:  Goal status: IN PROGRESS   2.  Pt will ID and attempt repair of communication breakdowns >90% of the time in session.    Baseline:  Goal status: IN PROGRESS   3.  Pt/family will  demonstrate knowledge of community resources and activities to support language/communication. Baseline:  Goal status: IN PROGRESS   4.  Pt will demonstrate use of swallowing precautions independently with s/sx aspiration <5% of trials. Baseline:  Goal status: IN PROGRESS     ASSESSMENT:   CLINICAL IMPRESSION: Patient presents with moderate Broca's aphasia, moderate verbal apraxia, and mild dysarthria. Pt/family report pt consuming regular solids and thin liquids at home now without difficulty. With encouragement, extended time  and mod cues, pt able to repair of breakdowns using multimodal means including alphabet board, writing at word level (agrammatism present), drawing, and description. Pt now initiating use of strategies to slow rate and improve accuracy of verbal output. Patient remains limited in his ability to relay thoughts, feelings, and non-contextual or abstract information. Pt reports resuming regular diet and pt/daughter-in-law deny swallowing difficulty at home. Pt appears motivated and is demonstrating excellent progress toward goals. I recommend skilled ST to improve pt's language and motor speech skills to improve communication and reduce frustration.    OBJECTIVE IMPAIRMENTS include expressive language, receptive language, aphasia, apraxia, dysarthria, and dysphagia. These impairments are limiting patient from managing medications, managing appointments, managing finances, household responsibilities, ADLs/IADLs, effectively communicating at home and in community, and safety when swallowing. Factors affecting potential to achieve goals and functional outcome are cooperation/participation level; pt appears highly motivated with good family support. Patient will benefit from skilled SLP services to address above impairments and improve overall function.   REHAB POTENTIAL: Excellent   PLAN: SLP FREQUENCY: 2x/week   SLP DURATION: 12 weeks   PLANNED INTERVENTIONS: Aspiration precaution training, Pharyngeal strengthening exercises, Diet toleration management , Language facilitation, Environmental controls, Trials of upgraded texture/liquids, Cognitive reorganization, Internal/external aids, Functional tasks, Multimodal communication approach, SLP instruction and feedback, Compensatory strategies, and Patient/family education   [name], MS, CCC-SLP Speech-Language Pathologist [phone]   [name], [place] 01/11/2022, 5:14 PM  [place] [address] [place], [place], [address] Phone: [phone]   Fax:  [phone]

## 2022-01-11 NOTE — Therapy (Signed)
[place] MAIN [facility] SERVICES [address] [place], [place], [address] Phone: [phone]   Fax:  [phone]  Patient Details  Name: Gerald Bauer MRN: [id number] Date of Birth: October 17, 1945 Referring Provider:  No ref. provider found  Encounter Date: 01/11/2022  Pt. Arrived for therapy today. Pt. Had a 30 min. wait between the end of ST, and the beginning of OT. ST reports the patient thought OT started at 2:15 in stead of 2:30. Pt. Ended up leaving with his brother. A call was made to follow-up with the Pt. Pt's next scheduled visit was reviewed with the Pt.   Harrel Carina, MS, OTR/L  Harrel Carina, OT 01/11/2022, 2:45 PM  [place] MAIN [facility] SERVICES [address] [place], [place], [address] Phone: [phone]   Fax:  [phone]

## 2022-01-16 ENCOUNTER — Ambulatory Visit: Payer: Medicare Other

## 2022-01-16 ENCOUNTER — Ambulatory Visit: Payer: Medicare Other | Admitting: Speech Pathology

## 2022-01-16 DIAGNOSIS — R482 Apraxia: Secondary | ICD-10-CM | POA: Diagnosis not present

## 2022-01-16 DIAGNOSIS — M6281 Muscle weakness (generalized): Secondary | ICD-10-CM

## 2022-01-16 DIAGNOSIS — I63512 Cerebral infarction due to unspecified occlusion or stenosis of left middle cerebral artery: Secondary | ICD-10-CM

## 2022-01-16 DIAGNOSIS — R278 Other lack of coordination: Secondary | ICD-10-CM

## 2022-01-16 DIAGNOSIS — R4701 Aphasia: Secondary | ICD-10-CM

## 2022-01-16 NOTE — Therapy (Signed)
OUTPATIENT OCCUPATIONAL THERAPY NEURO TREATMENT  Patient Name: Rayland Hamed MRN: [id number] DOB:1945/11/03, 76 y.o., male Today's Date: 01/16/2022  PCP: Threasa Alpha, PA REFERRING PROVIDER: Reesa Chew   OT End of Session - 01/16/22 1322     Visit Number 18    Number of Visits 24    Date for OT Re-Evaluation 01/29/22    Authorization Time Period Reporting period beginning 12/11/21    OT Start Time 1000    OT Stop Time 1045    OT Time Calculation (min) 45 min    Activity Tolerance Patient tolerated treatment well    Behavior During Therapy [facility] for tasks assessed/performed             Past Medical History:  Diagnosis Date   Acid reflux    Arthritis    hands   Carotid atherosclerosis    Diabetes mellitus without complication ([place])    Ganglion cyst 02/23/2015   Hyperlipemia    Hypertension    Joint pain in fingers of right hand 02/23/2015   Dominant hand   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 07/11/2015   Prostate cancer ([place])    history   Stroke (cerebrum) ([place]) 10/06/2021   Tachycardia    Tinea pedis of both feet 01/10/2017   Past Surgical History:  Procedure Laterality Date   [place]  2013   PROSTATECTOMY  2012   Patient Active Problem List   Diagnosis Date Noted   Hemiparesis, aphasia, and dysphagia as late effects of stroke ([place]) 11/07/2021   Lower urinary tract symptoms (LUTS) 11/07/2021   Myalgia due to statin 02/21/2021   Type 2 diabetes mellitus without complication, without long-term current use of insulin ([place]) 02/21/2021   Aortic atherosclerosis ([place]) 04/27/2020   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   Onychomycosis of multiple toenails with type 2 diabetes mellitus ([place]) 07/19/2016   Carotid stenosis 03/13/2016   Hyperlipidemia associated with type 2 diabetes mellitus ([place])    Hypertension associated with type 2 diabetes mellitus ([place])    Personal history of  prostate cancer    Carotid atherosclerosis    GERD (gastroesophageal reflux disease) 08/20/2013    ONSET DATE: 10/05/21  REFERRING DIAG: L MCA CVA  THERAPY DIAG:  Muscle weakness (generalized)  Other lack of coordination  Left middle cerebral artery stroke ([place])  Rationale for Evaluation and Treatment Rehabilitation  SUBJECTIVE:    SUBJECTIVE STATEMENT:  Pt reports doing well today.  Still reporting 5/10 pain in R shoulder.   PERTINENT HISTORY: Per chart, Kire Ferg is a 76 year old right-handed male with history of hypertension, hyperlipidemia, diabetes mellitus, prostate cancer/prostatectomy 2012, quit smoking 24 years ago.  Presented [facility] 10/05/2021 with right side weakness/facial droop and expressive aphasia.  Blood pressure 155/102.  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  No associated hemorrhage or significant mass effect.    PAIN:  Are you having pain?  5/10 R shoulder; heat, rest, gentle stretch affective for pain management FALLS: Has patient fallen in last 6 months? Yes. Number of falls 3 since CVA  PLOF: Independent/retired Dealer  PATIENT GOALS : Pt points to his hand (wants to be able to use his hand)  OBJECTIVE:   HAND DOMINANCE: Right   FUNCTIONAL OUTCOME MEASURES: FOTO: 48  UUPPER EXTREMITY ROM      Active ROM Right eval Left Eval WNL Right 12/11/21  Shoulder flexion 90 (  135)   95 (110)  Shoulder abduction 95 (110)   80 (90)  Wrist flex     54  Wrist ext     41    UPPER EXTREMITY MMT:      MMT Right eval Left Eval 5/5 Right 12/11/21  Shoulder flexion 3-   -3  Shoulder abduction 3-   -3  Shoulder adduction        Shoulder extension        Shoulder internal rotation 3-      Shoulder external rotation 3-      Middle trapezius        Lower trapezius        Elbow flexion 4-   4  Elbow extension 4+   5  Wrist flexion 4   NT d/t pain  Wrist extension 4-   NT d/t pain  (Blank rows =  not tested)   HAND FUNCTION: Eval:         Grip strength: Right: 14 lbs; Left: 58 lbs, Lateral pinch: Right: 7 lbs, Left: 19 lbs, and 3 point pinch: Right: 2 lbs, Left: 16 lbs 10th visit: Grip strength: Right: 16  lbs;                                Lateral pinch: Right: 9  lbs;                            3 point pinch: Right: 5 lbs   Eval: COORDINATION: Finger Nose Finger test: difficult/lacks precision with reaching targets 9 Hole Peg test: Right: unable sec; Left: 27 sec Able to oppose 2nd and 3rd digits to thumb on R hand   10th visit 9 hole Peg test: Right: 11 min, 44 sec    TODAY'S TREATMENT:  Moist heat applied to R shoulder for 20 min for pain management/muscle relaxation during completion of table top exercises for R wrist and hand.    Therapeutic Exercise: Facilitated grip strengthening with hand gripper set at moderate resistance with 1 green band for 3 sets 10 reps each, min vc for slower pace and max closure of hand with each squeeze.  Facilitated pinch strengthening with use of therapy resistant clothespins to target lateral pinch on the R hand.  Pt was able to manage all colors today, including 1 black clip independently (most resistant).  Pt had to use his L hand to help him clip the 2nd black pin.  Instructed pt in AROM for R wrist, as pt continues to wear his wrist brace when walking with walker.  Pt completed R wrist flex/ext, RD/UD, and pron/sup x 3 sets 10 reps each with intermittent min vc for maximizing end range.  Pt reported no pain with wrist mobility.  Instructed pt in table slides for shoulder flex/abd/circular patterns with L hand over R, cues for keeping ROM within a tolerable range for R shoulder pain.  Encouraged pt to complete these daily to decrease pain and stiffness in R shoulder.  Pt verbalized understanding.  Self Care: Facilitated hand writing skills.  Pt practiced tracing and drawing vertical, horizontal, diagonal, and circular lines.  Pt then practiced  writing his name with the L non-dominant hand, and then tracing this writing with the R dominant hand.  Pt first worked with a standard pen, but OT added rubber grip for easier prehension, and issued rubber grip for home  practice.  Pt required max vc for increasing letter height when tracing with the R, and cues for increasing pressure with the pen to paper.      PATIENT EDUCATION: Education details: HEP progression Person educated: pt Education method: Merchandiser, retail cues, handout Education comprehension: verbalized understanding, returned demo   HOME EXERCISE PROGRAM: Theraputty, hand writing skills, table slides  GOALS: Goals reviewed with patient? Yes   SHORT TERM GOALS: Target date: 12/18/21   Pt will be indep to perform HEP for increasing strength and coordination throughout RUE.  Baseline: Not yet initiated; 10th: putty given but pt reports limited use, instructed in table slides and self PROM for R wrist/digits.   Goal status: ongoing   2.  Pt will manage clothing fasteners with extra time, using RUE as an assist  Baseline: unable; pt has elastic laces and wears elastic waisted pants; 10th: pt uses R hand as an assist to zip and button jeans with extra time; not yet tried small buttons on a shirt.  Pt continues to use elastic laces on shoes. Goal status: ongoing   LONG TERM GOALS: Target date: 01/29/22   Pt will increase FOTO score to 55 or better to indicate improved performance with daily tasks.  Baseline: 48; 10th: 48 Goal status: ongoing   2.  Pt will increase R grip strength by 10 or more lbs to enable pt to hold and carry light ADL supplies without dropping.  Baseline: R grip 14#, L 58#; 10th visit: R grip 16 Goal status: INITIAL   3.  Pt will increase RUE strength to be able to engage RUE into ADLs at least 50% of the time. Baseline: Pt using L non-dominant arm to manage ADLs; 10th visit: Son reports he constantly sees pt attempt to use his R arm for daily  tasks, but continues to be limited and still must use LUE for most tasks (see chart for RUE MMT) Goal status: ongoing   4.  Pt will complete 9 hole peg test on the R in 3 min or less to work towards ability to use R hand to pick up small ADL supplies.  Baseline: Pt can remove a peg but can not pick one up; 10th visit: R 9 hole completion 11 min 44 sec Goal status: ongoing    ASSESSMENT:  CLINICAL IMPRESSION:  Pt tolerated moist heat for R shoulder pain today during completion of R hand and wrist table top exercises.  Pt's R hand strength continues to improve, noting pt's ability to independently clip 1 black clip today onto vertical dowel with a lateral pinch.  Guided pt through 3 sets 10 reps of AROM for the R wrist all planes d/t pt continues to wear wrist splint during mobility with walker.  Pt reported no pain or discomfort with AROM, and would benefit from trying resistance for wrist mobility in the next few sessions in order to wean from wrist brace safely.  Focussed on development of handwriting skills today for R dominant hand.  Pt required cues for increasing his line height and increasing pressure with pen into paper, and use of rubber grip added to pen.  Issued rubber grip for home practice as well.  At home, pt has been writing with his non-dominant hand since his stroke.  OT encouraged pt continue with L hand as needed, but to begin tracing his writing with his R dominant hand (when practicing at home) to start to transition back to the R hand.  Writing with R  hand is not yet legible, but pt now has handwriting exercises to practice at home which were reviewed in today's session.  Patient continues to work on right UE strength, grip strength, pinch strength, and fine motor coordination skills in order to improve RUE functioning, and improve engagement in, and maximize independence with ADLs and IADL tasks.  PERFORMANCE DEFICITS in functional skills including ADLs, IADLs, coordination,  dexterity, ROM, strength, pain, flexibility, FMC, GMC, mobility, balance, continence, decreased knowledge of use of DME, and UE functional use, cognitive skills including safety awareness, and psychosocial skills including.   IMPAIRMENTS are limiting patient from ADLs, IADLs, leisure, and social participation.   COMORBIDITIES may have co-morbidities  that affects occupational performance. Patient will benefit from skilled OT to address above impairments and improve overall function.  MODIFICATION OR ASSISTANCE TO COMPLETE EVALUATION: Min-Moderate modification of tasks or assist with assess necessary to complete an evaluation.  OT OCCUPATIONAL PROFILE AND HISTORY: Problem focused assessment: Including review of records relating to presenting problem.  CLINICAL DECISION MAKING: Moderate - several treatment options, min-mod task modification necessary  REHAB POTENTIAL: Good  EVALUATION COMPLEXITY: Moderate    PLAN: OT FREQUENCY: 2x/week  OT DURATION: 12 weeks  PLANNED INTERVENTIONS: self care/ADL training, therapeutic exercise, therapeutic activity, neuromuscular re-education, manual therapy, passive range of motion, balance training, functional mobility training, moist heat, cryotherapy, patient/family education, cognitive remediation/compensation, energy conservation, coping strategies training, and DME and/or AE instructions  RECOMMENDED OTHER SERVICES: N/A  CONSULTED AND AGREED WITH PLAN OF CARE: Patient and family member/caregiver  PLAN FOR NEXT SESSION: HEP progression, neuro re-ed, therapeutic exercises   Leta Speller, MS, OTR/L  Darleene Cleaver, OT 01/16/2022, 1:24 PM

## 2022-01-16 NOTE — Therapy (Signed)
OUTPATIENT SPEECH LANGUAGE PATHOLOGY TREATMENT NOTE   Patient Name: Gerald Bauer MRN: [id number] DOB:1945/11/23, 76 y.o., male Today's Date: 01/16/2022  PCP: [name], PA-C REFERRING PROVIDER: Reesa Chew, PA-C  END OF SESSION:   End of Session - 01/16/22 1113     Visit Number 17    Number of Visits 25    Date for SLP Re-Evaluation 02/04/22    SLP Start Time 0900    SLP Stop Time  1000    SLP Time Calculation (min) 60 min    Activity Tolerance Patient tolerated treatment well             Past Medical History:  Diagnosis Date   Acid reflux    Arthritis    hands   Carotid atherosclerosis    Diabetes mellitus without complication ([place])    Ganglion cyst 02/23/2015   Hyperlipemia    Hypertension    Joint pain in fingers of right hand 02/23/2015   Dominant hand   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 07/11/2015   Prostate cancer ([place])    history   Stroke (cerebrum) ([place]) 10/06/2021   Tachycardia    Tinea pedis of both feet 01/10/2017   Past Surgical History:  Procedure Laterality Date   [place]  2013   PROSTATECTOMY  2012   Patient Active Problem List   Diagnosis Date Noted   Hemiparesis, aphasia, and dysphagia as late effects of stroke ([place]) 11/07/2021   Lower urinary tract symptoms (LUTS) 11/07/2021   Myalgia due to statin 02/21/2021   Type 2 diabetes mellitus without complication, without long-term current use of insulin ([place]) 02/21/2021   Aortic atherosclerosis ([place]) 04/27/2020   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   Onychomycosis of multiple toenails with type 2 diabetes mellitus ([place]) 07/19/2016   Carotid stenosis 03/13/2016   Hyperlipidemia associated with type 2 diabetes mellitus ([place])    Hypertension associated with type 2 diabetes mellitus ([place])    Personal history of prostate cancer    Carotid atherosclerosis    GERD (gastroesophageal reflux disease)  08/20/2013    ONSET DATE: 10/05/2021    REFERRING DIAG: Cerebral infarction due to unspecified occlusion of left middle cerebral artery  THERAPY DIAG:  Verbal apraxia  Aphasia Verbal apraxia  Rationale for Evaluation and Treatment Rehabilitation  SUBJECTIVE STATEMENT:             Pt retrieves homework from his pocket   Pt accompanied by: self  PERTINENT HISTORY: Pt  is a 76 year old male with history of left-sided carotid stenosis, non-insulin-dependent diabetes mellitus type 2, PAD, GERD, mild COPD, history of tobacco use, hyperlipidemia, hypertension, who presented 10/05/21 to [facility] ED with facial droop, expressive aphasia, and R sided weakness and found to have left MCA CVA. He was admitted to [facility] 7/17-10/31/21. Advanced to dysphagia 3/thin liquids by cup by time of d/c from CIR. 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 Difficult to fully assess cognition given severity of linguistic impairment; however, does present with decreased safety awareness, diminished problem-solving, emergent awareness, and mild impulsivity with movement."     DIAGNOSTIC FINDINGS: MBS 10/19/21: "Pt presents with overall mild oropharyngeal dysphagia. Oral phase c/b piecemeal deglutition, decreased mastication, decreased bolus cohesion, premature spillage, and weak lingual manipulation. Pharyngeal phase c/b reduced pharyngeal peristalsis + reduced BOT approximation to PPW, which results in mild to  moderate vallecular and pyriform sinuses residuals (vallecula > pyriform sinuses for solids, pyriform sinuses > vallecula for liquids). No penetration nor aspiration appreciated with thin liquid via cup, Dysphagia 1 (puree), Dysphagia 2 (chopped), Dysphagia 3 (mechanical soft), or with 13 mm Barium tablet placed in puree. Once instance of sensed aspiration with intake of large bolus of thin liquid via straw due to swallow  initiation delay to pyriform sinuses and mistiming of swallow-breath cycle; despite strong reflexive cough aspirant was not fully cleared from trachea. Pt's level of swallow initiation was noted to vary with liquid consumption (over base of epiglottis and at pyriform sinuses) with level of swallow initiation for solids to be consistent at the vallecula. Pharyngeal stasis was partially cleared with reflexive secondary swallow." MRI brain 10/05/21: 1. 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 No associated hemorrhage or significant regional mass effect. 2. Loss of normal flow voids throughout the left ICA and MCA, consistent with slow flow and/or occlusion. 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 3. Underlying mild chronic microvascular ischemic disease. Tiny remote left ACA distribution infarct.  PAIN:  Are you having pain? No  PATIENT GOALS talk better   OBJECTIVE:    TODAY'S TREATMENT:  Pt read phrases of 3-4 words using slow rate, initiating repair and tactile self-cuing when breakdowns occurred 70% of the time. Generated association for given word 90% acc; responsive naming 85% accuracy. Facilitated simple discourse TI:[id number] and family history. Occasional min cues necessary to write keyword when slowing rate was not effective in repairing breakdown. Cues for louder speech improved intelligibility.     PATIENT EDUCATION: Education details: see today's treatment for details Person educated: Patient Education method: Explanation, Demonstration, and written supports Education comprehension: verbalized understanding and needs further education     GOALS: Goals reviewed with patient? Yes   SHORT TERM GOALS: Target date: 10 sessions   Patient will participate in clinical assessment of swallow function with goals added as needed. Baseline: Goal status: MET   2.  Pt will communicate emergency information  100% accuracy using visual aid if necessary.  Baseline:  Goal status: IN PROGRESS (partially met, will continue; 70% intelligible with mod-max cues)   3.  Pt will approximate personally relevant words and phrases >80% accuracy using script training and min visual cues for apraxia.   Baseline:  Goal status: MET   4.  Pt will generate at least 4 descriptors of target word 80% of the time using semantic feature analysis to improve abilities in wordfinding and resolving communication breakdowns.  Baseline:  Goal status: MET   5.  Pt will complete HEP for dysphagia with rare min A. Baseline:  Goal status: MET   6.  Pt will generate sentences using personally relevant words list for >80% intelligibility. Baseline: 50% intelligibility Goal status: INITIAL   5.  Pt will improve expressive language skills by taking 4-6 turns in simple conversation, with supported conversation strategies if needed. Baseline:  Goal status: INITIAL       LONG TERM GOALS: Target date: 02/04/2022   Pt will engage in 5-8 minutes simple-mod complex conversation re: topic of interest with supported conversation, aphasia compensations.  Baseline:  Goal status: IN PROGRESS   2.  Pt will ID and attempt repair of communication breakdowns >90% of the time in session.    Baseline:  Goal status: IN PROGRESS   3.  Pt/family will demonstrate knowledge of community resources and activities to support language/communication. Baseline:  Goal status: IN PROGRESS   4.  Pt will demonstrate use of swallowing precautions independently with s/sx aspiration <5% of trials. Baseline:  Goal status: IN PROGRESS     ASSESSMENT:   CLINICAL IMPRESSION: Patient presents with moderate Broca's aphasia, moderate verbal apraxia, and mild dysarthria. With encouragement, extended time and mod cues, pt able to repair of breakdowns using multimodal means including alphabet board, writing at word level (agrammatism present), drawing,  and description. Pt now initiating use of strategies to slow rate and improve accuracy of verbal output. Patient remains limited in his ability to relay thoughts, feelings, and non-contextual or abstract information. Pt reports resuming regular diet and pt/daughter-in-law deny swallowing difficulty at home. Pt appears motivated and is demonstrating excellent progress toward goals. I recommend skilled ST to improve pt's language and motor speech skills to improve communication and reduce frustration.    OBJECTIVE IMPAIRMENTS include expressive language, receptive language, aphasia, apraxia, dysarthria, and dysphagia. These impairments are limiting patient from managing medications, managing appointments, managing finances, household responsibilities, ADLs/IADLs, effectively communicating at home and in community, and safety when swallowing. Factors affecting potential to achieve goals and functional outcome are cooperation/participation level; pt appears highly motivated with good family support. Patient will benefit from skilled SLP services to address above impairments and improve overall function.   REHAB POTENTIAL: Excellent   PLAN: SLP FREQUENCY: 2x/week   SLP DURATION: 12 weeks   PLANNED INTERVENTIONS: Aspiration precaution training, Pharyngeal strengthening exercises, Diet toleration management , Language facilitation, Environmental controls, Trials of upgraded texture/liquids, Cognitive reorganization, Internal/external aids, Functional tasks, Multimodal communication approach, SLP instruction and feedback, Compensatory strategies, and Patient/family education   [name], MS, CCC-SLP Speech-Language Pathologist [phone]   [name], [place] 01/16/2022, 11:13 AM  [facility] [address] [place], [place], [address] Phone: [phone]   Fax:  [phone]

## 2022-01-18 ENCOUNTER — Ambulatory Visit: Payer: Medicare Other | Admitting: Speech Pathology

## 2022-01-18 ENCOUNTER — Ambulatory Visit: Payer: Medicare Other

## 2022-01-18 DIAGNOSIS — M6281 Muscle weakness (generalized): Secondary | ICD-10-CM

## 2022-01-18 DIAGNOSIS — I63512 Cerebral infarction due to unspecified occlusion or stenosis of left middle cerebral artery: Secondary | ICD-10-CM

## 2022-01-18 DIAGNOSIS — R278 Other lack of coordination: Secondary | ICD-10-CM

## 2022-01-18 DIAGNOSIS — R262 Difficulty in walking, not elsewhere classified: Secondary | ICD-10-CM

## 2022-01-18 DIAGNOSIS — R482 Apraxia: Secondary | ICD-10-CM

## 2022-01-18 DIAGNOSIS — R2681 Unsteadiness on feet: Secondary | ICD-10-CM

## 2022-01-18 DIAGNOSIS — R4701 Aphasia: Secondary | ICD-10-CM

## 2022-01-18 DIAGNOSIS — R269 Unspecified abnormalities of gait and mobility: Secondary | ICD-10-CM

## 2022-01-18 NOTE — Therapy (Signed)
OUTPATIENT SPEECH LANGUAGE PATHOLOGY TREATMENT NOTE   Patient Name: Gerald Bauer MRN: [id number] DOB:1945-09-26, 76 y.o., male Today's Date: 01/18/2022  PCP: [name], PA-C REFERRING PROVIDER: Reesa Chew, PA-C  END OF SESSION:   End of Session - 01/18/22 1459     Visit Number 18    Number of Visits 25    Date for SLP Re-Evaluation 02/04/22    SLP Start Time 25    SLP Stop Time  1200    SLP Time Calculation (min) 60 min    Activity Tolerance Patient tolerated treatment well             Past Medical History:  Diagnosis Date   Acid reflux    Arthritis    hands   Carotid atherosclerosis    Diabetes mellitus without complication ([place])    Ganglion cyst 02/23/2015   Hyperlipemia    Hypertension    Joint pain in fingers of right hand 02/23/2015   Dominant hand   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 07/11/2015   Prostate cancer ([place])    history   Stroke (cerebrum) ([place]) 10/06/2021   Tachycardia    Tinea pedis of both feet 01/10/2017   Past Surgical History:  Procedure Laterality Date   [place]  2013   PROSTATECTOMY  2012   Patient Active Problem List   Diagnosis Date Noted   Hemiparesis, aphasia, and dysphagia as late effects of stroke ([place]) 11/07/2021   Lower urinary tract symptoms (LUTS) 11/07/2021   Myalgia due to statin 02/21/2021   Type 2 diabetes mellitus without complication, without long-term current use of insulin ([place]) 02/21/2021   Aortic atherosclerosis ([place]) 04/27/2020   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   Onychomycosis of multiple toenails with type 2 diabetes mellitus ([place]) 07/19/2016   Carotid stenosis 03/13/2016   Hyperlipidemia associated with type 2 diabetes mellitus ([place])    Hypertension associated with type 2 diabetes mellitus ([place])    Personal history of prostate cancer    Carotid atherosclerosis    GERD (gastroesophageal reflux disease)  08/20/2013    ONSET DATE: 10/05/2021    REFERRING DIAG: Cerebral infarction due to unspecified occlusion of left middle cerebral artery  THERAPY DIAG:  Verbal apraxia  Aphasia Verbal apraxia  Rationale for Evaluation and Treatment Rehabilitation  SUBJECTIVE STATEMENT:             Pt retrieves homework from his pocket   Pt accompanied by: daughter in law [name]  PERTINENT HISTORY: Pt  is a 76 year old male with history of left-sided carotid stenosis, non-insulin-dependent diabetes mellitus type 2, PAD, GERD, mild COPD, history of tobacco use, hyperlipidemia, hypertension, who presented 10/05/21 to [facility] ED with facial droop, expressive aphasia, and R sided weakness and found to have left MCA CVA. He was admitted to [facility] 7/17-10/31/21. Advanced to dysphagia 3/thin liquids by cup by time of d/c from CIR. 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 Difficult to fully assess cognition given severity of linguistic impairment; however, does present with decreased safety awareness, diminished problem-solving, emergent awareness, and mild impulsivity with movement."     DIAGNOSTIC FINDINGS: MBS 10/19/21: "Pt presents with overall mild oropharyngeal dysphagia. Oral phase c/b piecemeal deglutition, decreased mastication, decreased bolus cohesion, premature spillage, and weak lingual manipulation. Pharyngeal phase c/b reduced pharyngeal peristalsis + reduced BOT approximation to PPW, which results  in mild to moderate vallecular and pyriform sinuses residuals (vallecula > pyriform sinuses for solids, pyriform sinuses > vallecula for liquids). No penetration nor aspiration appreciated with thin liquid via cup, Dysphagia 1 (puree), Dysphagia 2 (chopped), Dysphagia 3 (mechanical soft), or with 13 mm Barium tablet placed in puree. Once instance of sensed aspiration with intake of large bolus of thin liquid via straw  due to swallow initiation delay to pyriform sinuses and mistiming of swallow-breath cycle; despite strong reflexive cough aspirant was not fully cleared from trachea. Pt's level of swallow initiation was noted to vary with liquid consumption (over base of epiglottis and at pyriform sinuses) with level of swallow initiation for solids to be consistent at the vallecula. Pharyngeal stasis was partially cleared with reflexive secondary swallow." MRI brain 10/05/21: 1. 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 No associated hemorrhage or significant regional mass effect. 2. Loss of normal flow voids throughout the left ICA and MCA, consistent with slow flow and/or occlusion. 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 3. Underlying mild chronic microvascular ischemic disease. Tiny remote left ACA distribution infarct.  PAIN:  Are you having pain? No  PATIENT GOALS talk better   OBJECTIVE:    TODAY'S TREATMENT:  Pt read phrases of 3-4 words using slow rate, initiating repair and tactile self-cuing when breakdowns occurred 80% of the time. Noted apraxic errors more frequently with blends. Facilitated production of /sl/ and /pl/ blends with initial moderate cues, fading to occasional min A. Pt accuracy at word level 80%, 90% with self-correction. Progressed to sentence level reading with targets 85% acc.     PATIENT EDUCATION: Education details: see today's treatment for details Person educated: Patient Education method: Explanation, Demonstration, and written supports Education comprehension: verbalized understanding and needs further education     GOALS: Goals reviewed with patient? Yes   SHORT TERM GOALS: Target date: 10 sessions   Patient will participate in clinical assessment of swallow function with goals added as needed. Baseline: Goal status: MET   2.  Pt will communicate emergency information 100% accuracy  using visual aid if necessary.  Baseline:  Goal status: IN PROGRESS (partially met, will continue; 70% intelligible with mod-max cues)   3.  Pt will approximate personally relevant words and phrases >80% accuracy using script training and min visual cues for apraxia.   Baseline:  Goal status: MET   4.  Pt will generate at least 4 descriptors of target word 80% of the time using semantic feature analysis to improve abilities in wordfinding and resolving communication breakdowns.  Baseline:  Goal status: MET   5.  Pt will complete HEP for dysphagia with rare min A. Baseline:  Goal status: MET   6.  Pt will generate sentences using personally relevant words list for >80% intelligibility. Baseline: 50% intelligibility Goal status: INITIAL   5.  Pt will improve expressive language skills by taking 4-6 turns in simple conversation, with supported conversation strategies if needed. Baseline:  Goal status: INITIAL       LONG TERM GOALS: Target date: 02/04/2022   Pt will engage in 5-8 minutes simple-mod complex conversation re: topic of interest with supported conversation, aphasia compensations.  Baseline:  Goal status: IN PROGRESS   2.  Pt will ID and attempt repair of communication breakdowns >90% of the time in session.    Baseline:  Goal status: IN PROGRESS   3.  Pt/family will demonstrate knowledge of community resources and activities to  support language/communication. Baseline:  Goal status: IN PROGRESS   4.  Pt will demonstrate use of swallowing precautions independently with s/sx aspiration <5% of trials. Baseline:  Goal status: IN PROGRESS     ASSESSMENT:   CLINICAL IMPRESSION: Patient presents with moderate Broca's aphasia, moderate verbal apraxia, and mild dysarthria. With encouragement, extended time and mod cues, pt able to repair of breakdowns using multimodal means including alphabet board, writing at word level (agrammatism present), drawing, and  description. Pt now initiating use of strategies to slow rate and improve accuracy of verbal output. Continues to improve error awareness and attempts at self-correction. Patient remains limited in his ability to relay thoughts, feelings, and non-contextual or abstract information. Pt reports resuming regular diet and pt/daughter-in-law deny swallowing difficulty at home. Pt appears motivated and is demonstrating excellent progress toward goals. I recommend skilled ST to improve pt's language and motor speech skills to improve communication and reduce frustration.    OBJECTIVE IMPAIRMENTS include expressive language, receptive language, aphasia, apraxia, dysarthria, and dysphagia. These impairments are limiting patient from managing medications, managing appointments, managing finances, household responsibilities, ADLs/IADLs, effectively communicating at home and in community, and safety when swallowing. Factors affecting potential to achieve goals and functional outcome are cooperation/participation level; pt appears highly motivated with good family support. Patient will benefit from skilled SLP services to address above impairments and improve overall function.   REHAB POTENTIAL: Excellent   PLAN: SLP FREQUENCY: 2x/week   SLP DURATION: 12 weeks   PLANNED INTERVENTIONS: Aspiration precaution training, Pharyngeal strengthening exercises, Diet toleration management , Language facilitation, Environmental controls, Trials of upgraded texture/liquids, Cognitive reorganization, Internal/external aids, Functional tasks, Multimodal communication approach, SLP instruction and feedback, Compensatory strategies, and Patient/family education   [name], MS, CCC-SLP Speech-Language Pathologist [phone]   [name], [place] 01/18/2022, 3:00 PM  [facility] [address] [place], [place], [address] Phone: [phone]   Fax:   [phone]

## 2022-01-18 NOTE — Therapy (Signed)
OUTPATIENT PHYSICAL THERAPY NEURO PROGRESS NOTE   Patient Name: Gerald Bauer MRN: [id number] DOB:1946-01-24, 76 y.o., male Today's Date: 01/18/2022   PCP: [name], PA-C REFERRING PROVIDER: Bary Leriche, PA-C   PT End of Session - 01/18/22 0931     Visit Number 11    Number of Visits 24    Date for PT Re-Evaluation 02/16/22    Progress Note Due on Visit 10    PT Start Time 0931    PT Stop Time 1015    PT Time Calculation (min) 44 min    Equipment Utilized During Treatment Gait belt    Activity Tolerance Patient tolerated treatment well    Behavior During Therapy [facility] for tasks assessed/performed              Past Medical History:  Diagnosis Date   Acid reflux    Arthritis    hands   Carotid atherosclerosis    Diabetes mellitus without complication ([place])    Ganglion cyst 02/23/2015   Hyperlipemia    Hypertension    Joint pain in fingers of right hand 02/23/2015   Dominant hand   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 07/11/2015   Prostate cancer ([place])    history   Stroke (cerebrum) ([place]) 10/06/2021   Tachycardia    Tinea pedis of both feet 01/10/2017   Past Surgical History:  Procedure Laterality Date   [place]  2013   PROSTATECTOMY  2012   Patient Active Problem List   Diagnosis Date Noted   Hemiparesis, aphasia, and dysphagia as late effects of stroke ([place]) 11/07/2021   Lower urinary tract symptoms (LUTS) 11/07/2021   Myalgia due to statin 02/21/2021   Type 2 diabetes mellitus without complication, without long-term current use of insulin ([place]) 02/21/2021   Aortic atherosclerosis ([place]) 04/27/2020   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   Onychomycosis of multiple toenails with type 2 diabetes mellitus ([place]) 07/19/2016   Carotid stenosis 03/13/2016   Hyperlipidemia associated with type 2 diabetes mellitus ([place])    Hypertension associated with type 2 diabetes  mellitus ([place])    Personal history of prostate cancer    Carotid atherosclerosis    GERD (gastroesophageal reflux disease) 08/20/2013    ONSET DATE: 10/05/21  REFERRING DIAG: E32.122 (ICD-10-CM) - Cerebral infarction due to unspecified occlusion or stenosis of left middle cerebral artery  THERAPY DIAG:  Difficulty in walking, not elsewhere classified  Muscle weakness (generalized)  Other lack of coordination  Unsteadiness on feet  Abnormality of gait and mobility  Rationale for Evaluation and Treatment Rehabilitation  SUBJECTIVE:  SUBJECTIVE STATEMENT:  Pt reports having 5/10 shoulder pain and [name] reports that the shoulder pain is what woke the pt up this morning around 5 AM.    Pt accompanied by: self and [name] (DTR)  PAIN today:  Are you having pain? Yes: NPRS scale: 10/10 Pain location: bil shoulders, achey/dull, sharp  PERTINENT HISTORY:Per H and P from 7/17 hospital d/c Pt  is a 76 year old male with history of left-sided carotid stenosis, non-insulin-dependent diabetes mellitus type 2, PAD, GERD, mild COPD, history of tobacco use, hyperlipidemia, hypertension, who presents emergency department for chief concerns of facial droop, expressive aphasia, and R sided weakness  PAIN:  Are you having pain? Yes: NPRS scale: 9/10 Pain location: B shoulders, with R being primary complaint  PRECAUTIONS: Fall  WEIGHT BEARING RESTRICTIONS No  FALLS: Has patient fallen in last 6 months? Yes. Number of falls 1  LIVING ENVIRONMENT: Lives with: lives with their family and lived alone prior to onset  Lives in: House/apartment Stairs: Yes: External: 4 steps; can reach both Has following equipment at home: Walker - 2 wheeled and shower chair  PLOF: Independent  PATIENT GOALS become  independent with everyday activities like he was prior to the onset of the stroke   OBJECTIVE:   DIAGNOSTIC FINDINGS: 1. 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 No associated hemorrhage or significant regional mass effect.  2. Loss of normal flow voids throughout the left ICA and MCA,  consistent with slow flow and/or occlusion. Susceptibility artifact  within proximal left MCA branches consistent with intraluminal  thrombus.  3. Underlying mild chronic microvascular ischemic disease. Tiny  remote left ACA distribution infarct.     MRA HEAD IMPRESSION:   "1. Interval near complete occlusion of the left ICA and MCA,  presumably acute in nature given the presence of the acute left MCA  territory infarct. Left A1 segment not well seen either, also likely  occluded.  2. Otherwise wide patency of the major intracranial arterial  circulation. No other hemodynamically significant or correctable  stenosis.     MRA NECK IMPRESSION:  1. 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 Some attenuated flow seen  distally within the cervical left ICA, with subsequent reocclusion  by the skull base.  2. 50% atheromatous stenosis at the origin of the cervical right  ICA. Otherwise wide patency of the right carotid artery system.  3. Wide patency of the vertebral arteries within the neck.".    COGNITION: Overall cognitive status: Within functional limits for tasks assessed and subjective history limited secondary to aphasia but able to answer 1 word questions appropriately.    SENSATION: WFL  COORDINATION: WNL with heel to shin testing    LOWER EXTREMITY ROM:   AROM WNL    LOWER EXTREMITY MMT:    MMT Right Eval Left Eval  Hip flexion 4 4+  Hip extension    Hip abduction 4 5  Hip adduction 4 5  Hip internal rotation    Hip external rotation    Knee flexion 4 5  Knee extension 4 5  Ankle dorsiflexion 4+ 5  Ankle  plantarflexion 4+ 5  Ankle inversion 5 5  Ankle eversion 5 5  (Blank rows = not tested)   TRANSFERS: Assistive device utilized: Environmental consultant - 2 wheeled  Sit to stand: Modified independence Stand to sit: Complete Independence Chair to chair: SBA  STAIRS:  Level of Assistance: SBA  Stair Negotiation Technique: Step to Pattern with Bilateral Rails Number of Stairs: 4  Height of Stairs: 6in  Comments: Right lower extremity foot catches when a sending steps and patient performs descending steps utilizing right lower extremity for more support.  Patient reports he knows he is doing this "the hard way"  GAIT: Gait pattern: decreased arm swing- Right and decreased step length- Right Distance walked: 10 M Assistive device utilized: Environmental consultant - 2 wheeled and None Level of assistance: Modified independence Comments: difficulty with turns and object navigation per DGI tesitng, able to walk without AD but only in controlled environment without obstacles or significant challenge  FUNCTIONAL TESTs:  5 times sit to stand: 16.95 sec no UE assist 10 meter walk test: .76 m/s without AD  Berg Balance Scale: 44/56 DGI:10  Progress Note 10/10: 5x STS: 13sec no UE support DGI:14/24 BERG: 50/56 10 meter walk test: .09 m/s without AD (normal speed)   PATIENT SURVEYS:  FOTO to retest next session, likely with either asking patient questions or having caregiver complete patient completed but had 97% indicating he could run and jump with these and when questioned about this following patient reports he cannot run or jump with these and would benefit from filling this out again next time.  12/04/2021: 49 (goal 62) 01/02/2022: 92 (goal 62)    TODAY'S TREATMENT:   Gait belt donned, CGA provided unless noted otherwise  NMR: Seated BP prior to session 140/100 mmHg  HR 90 bpm  Seated BP following ambulation 135/87 mmHg HR 105 bpm  NBOS with EC, x30 sec  47f ambulation in hallway without  AD: backwards ambulation length of hallway x4 side stepping with serial counting length of hallway x4 high knees length of hallway x4  Standing on airex pad: Dynamic basketball toss, 3 rounds of 15 tosses, with cross body reaching for basketballs   Updated HEP as noted below to add resistance to current exercise program.    PATIENT EDUCATION: Education details: Pt educated throughout session about proper posture and technique with exercises. Improved exercise technique, movement at target joints, use of target muscles after min to mod verbal, visual, tactile cues.   Person educated: Patient Education method: Explanation, Demonstration, Tactile cues, and Verbal cues Education comprehension: verbalized understanding, returned demonstration, verbal cues required, tactile cues required, and needs further education   HOME EXERCISE PROGRAM:  No updates today, pt to continue as previously given  9/11: Pt to continue HEP as previously given. However, PT did provide education to use a chair with arm-rests for lateral support when performing STS  10/3: added standing hip ABD/ext with UE support on counter 10/26: updated as below  Access Code: [email or web address] URL: [email or web address] Date: 01/18/2022 Prepared by: JRaleigh Nation Exercises - Prone Hip Extension  - 1 x daily - 7 x weekly - 3 sets - 10 reps - Standing Hip Abduction with Resistance at Ankles and Counter Support  - 1 x daily - 7 x weekly - 3 sets - 10 reps - Standing Hip Extension with Resistance at Ankles and Counter Support  - 1 x daily - 7 x weekly - 3 sets - 10 reps - Side Stepping with Resistance at Ankles and Counter Support  - 1 x daily - 7 x weekly - 3 sets - 10 reps - Forward and Backward Monster Walk with Resistance at Ankles and Counter Support  - 1 x daily - 7 x weekly - 3 sets - 10 reps -[employer] GroupWalk with Resistance  at [employer] and [employer]  - 1 x daily - 7 x weekly - 3 sets - 10  reps    GOALS: Goals reviewed with patient? Yes  SHORT TERM GOALS: Target date: 12/22/2021   Patient will be independent in home exercise program to improve strength/mobility for better functional independence with ADLs. Baseline: No HEP currently, (10/10) not compliant Goal status: INITIAL    LONG TERM GOALS: Target date: 02/16/2022   1.  Patient (> 13 years old) will complete five times sit to stand test in < 15 seconds indicating an increased LE strength and improved balance. Baseline: 16.95 01/02/22: 13 sec Goal status: MET  2.  Patient will increase FOTO score to equal to or greater than  60   to demonstrate statistically significant improvement in mobility and quality of life.  Baseline: Test next session 01/02/22: 92 Goal status: MET   3.  Patient will increase Berg Balance score by > 6 points to demonstrate decreased fall risk during functional activities. Baseline: 44 01/02/22: 50/56 Goal status: MET   4.  Patient will improve DGI by 5 points or more to reduce fall risk and demonstrate improved gait ability. Baseline: 10 01/02/22: 14 Goal status: PROGRESSING  5.  Patient will increase 10 meter walk test to >1.64ms as to improve gait speed for better community ambulation and to reduce fall risk. Baseline: .792m 01/02/22: 0.9 m/s Goal status: PROGRESSING  6.  Patient will increase Berg Balance score by > 6 points to demonstrate decreased fall risk during functional activities. Baseline: 44 01/02/22: 50/56 Goal status: PROGRESSING  7. FGA goal      ASSESSMENT:  CLINICAL IMPRESSION:   Pt performed well with mobility and exercises targeting LE strengthening.  Pt given updated HEP with resistance band in order to perform exercises at home.  Pt performed well with exercises and continues to put forth good effort throughout.  Pt still with some difficulty verbally expressing himself, but is able to respond to yes/no questions easier.   Pt will continue to  benefit from skilled therapy to address remaining deficits in order to improve overall QoL and return to PLOF.    Plan to perform FGA at next visit and establish a goal    OBJECTIVE IMPAIRMENTS Abnormal gait, cardiopulmonary status limiting activity, decreased activity tolerance, decreased balance, decreased endurance, decreased mobility, difficulty walking, decreased strength, and decreased safety awareness.   ACTIVITY LIMITATIONS lifting, bending, standing, squatting, stairs, transfers, and locomotion level  PARTICIPATION LIMITATIONS: meal prep, shopping, and community activity  PERSONAL FACTORS Age and 3+ comorbidities: left-sided carotid stenosis, non-insulin-dependent diabetes mellitus type 2, PAD, GERD, mild COPD, history of tobacco use, hyperlipidemia, hypertension  are also affecting patient's functional outcome.   REHAB POTENTIAL: Good  CLINICAL DECISION MAKING: Evolving/moderate complexity  EVALUATION COMPLEXITY: Moderate  PLAN: PT FREQUENCY: 2x/week  PT DURATION: 12 weeks  PLANNED INTERVENTIONS: Therapeutic exercises, Therapeutic activity, Neuromuscular re-education, Balance training, Gait training, Patient/Family education, Self Care, Joint mobilization, Stair training, Moist heat, and Manual therapy  PLAN FOR NEXT SESSION: FGA test, continue with functional/dynamic balance interventions   JoGwenlyn SaranPT, DPT Physical Therapist- CoSouth Pointe Surgical Center10/26/23, 3:11 PM

## 2022-01-21 NOTE — Therapy (Signed)
OUTPATIENT OCCUPATIONAL THERAPY NEURO TREATMENT  Patient Name: Gerald Bauer MRN: [id number] DOB:10/22/1945, 76 y.o., male Today's Date: 01/21/2022  PCP: Threasa Alpha, PA REFERRING PROVIDER: Reesa Chew   OT End of Session - 01/21/22 1903     Visit Number 19    Number of Visits 24    Date for OT Re-Evaluation 01/29/22    Authorization Time Period Reporting period beginning 12/11/21    OT Start Time 1015    OT Stop Time 1100    OT Time Calculation (min) 45 min    Activity Tolerance Patient tolerated treatment well    Behavior During Therapy [facility] for tasks assessed/performed             Past Medical History:  Diagnosis Date   Acid reflux    Arthritis    hands   Carotid atherosclerosis    Diabetes mellitus without complication ([place])    Ganglion cyst 02/23/2015   Hyperlipemia    Hypertension    Joint pain in fingers of right hand 02/23/2015   Dominant hand   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 07/11/2015   Prostate cancer ([place])    history   Stroke (cerebrum) ([place]) 10/06/2021   Tachycardia    Tinea pedis of both feet 01/10/2017   Past Surgical History:  Procedure Laterality Date   [place]  2013   PROSTATECTOMY  2012   Patient Active Problem List   Diagnosis Date Noted   Hemiparesis, aphasia, and dysphagia as late effects of stroke ([place]) 11/07/2021   Lower urinary tract symptoms (LUTS) 11/07/2021   Myalgia due to statin 02/21/2021   Type 2 diabetes mellitus without complication, without long-term current use of insulin ([place]) 02/21/2021   Aortic atherosclerosis ([place]) 04/27/2020   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   Onychomycosis of multiple toenails with type 2 diabetes mellitus ([place]) 07/19/2016   Carotid stenosis 03/13/2016   Hyperlipidemia associated with type 2 diabetes mellitus ([place])    Hypertension associated with type 2 diabetes mellitus ([place])    Personal history of  prostate cancer    Carotid atherosclerosis    GERD (gastroesophageal reflux disease) 08/20/2013    ONSET DATE: 10/05/21  REFERRING DIAG: L MCA CVA  THERAPY DIAG:  Muscle weakness (generalized)  Other lack of coordination  Left middle cerebral artery stroke ([place])  Rationale for Evaluation and Treatment Rehabilitation  SUBJECTIVE:    SUBJECTIVE STATEMENT:  Pt arrived with DIL today.  Pt reports some pain in R shoulder pt declined moist heat to shoulder today.   PERTINENT HISTORY: Per chart, Gerald Bauer is a 76 year old right-handed male with history of hypertension, hyperlipidemia, diabetes mellitus, prostate cancer/prostatectomy 2012, quit smoking 24 years ago.  Present[facility] to [facility]05/2021 with right side weakness/facial droop and expressive aphasia.  Blood pressure 155/102.  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  No associated hemorrhage or significant mass effect.    PAIN:  Are you having pain?  5/10 R shoulder; heat, rest, gentle stretch affective for pain management FALLS: Has patient fallen in last 6 months? Yes. Number of falls 3 since CVA  PLOF: Independent/retired Dealer  PATIENT GOALS : Pt points to his hand (wants to be able to use his hand)  OBJECTIVE:   HAND DOMINANCE: Right   FUNCTIONAL OUTCOME MEASURES: FOTO: 48  UUPPER EXTREMITY ROM      Active ROM Right eval Left Eval  WNL Right 12/11/21  Shoulder flexion 90 (135)   95 (110)  Shoulder abduction 95 (110)   80 (90)  Wrist flex     54  Wrist ext     41    UPPER EXTREMITY MMT:      MMT Right eval Left Eval 5/5 Right 12/11/21  Shoulder flexion 3-   -3  Shoulder abduction 3-   -3  Shoulder adduction        Shoulder extension        Shoulder internal rotation 3-      Shoulder external rotation 3-      Middle trapezius        Lower trapezius        Elbow flexion 4-   4  Elbow extension 4+   5  Wrist flexion 4   NT d/t pain  Wrist extension  4-   NT d/t pain  (Blank rows = not tested)   HAND FUNCTION: Eval:         Grip strength: Right: 14 lbs; Left: 58 lbs, Lateral pinch: Right: 7 lbs, Left: 19 lbs, and 3 point pinch: Right: 2 lbs, Left: 16 lbs 10th visit: Grip strength: Right: 16  lbs;                                Lateral pinch: Right: 9  lbs;                            3 point pinch: Right: 5 lbs   Eval: COORDINATION: Finger Nose Finger test: difficult/lacks precision with reaching targets 9 Hole Peg test: Right: unable sec; Left: 27 sec Able to oppose 2nd and 3rd digits to thumb on R hand   10th visit 9 hole Peg test: Right: 11 min, 44 sec    TODAY'S TREATMENT:  Therapeutic Exercise: Facilitated grip strengthening with hand gripper set at moderate resistance with 1 green band for 3 sets 10 reps each, min vc for slower pace and max closure of hand with each squeeze.  Facilitated pinch strengthening with use of therapy resistant clothespins to target lateral pinch on the R hand.  Pt was able to manage all colors today.  Pt had to use his L hand to help him clip the 2nd black pin on the 2nd trial today.  Performed AROM for R wrist all planes x10 each.  Added a 1# weight for wrist flex/ext, and performed an isometric hold with the 1# weight for RD as pt was unable to tolerate AROM for RD with the 1# weight. Performed 2 sets 10 reps with weight.  Recommended pt perform above with a plastic water bottle or soup can at home.    Self Care: Facilitated hand writing skills.  Pt practiced tracing and drawing vertical, horizontal, diagonal, and circular lines.  Pt then practiced writing his name with the L non-dominant hand, and then tracing this writing with the R dominant hand.  Pt worked with lined paper and a rubber built of grip.  Pt continues to require max vc for increasing letter height when tracing with the R, but fewer cues today for increasing pressure with the pen to paper.      PATIENT EDUCATION: Education details: HEP  progression, soup can or water bottle for wrist AROM (isometric hold for RD only) Person educated: pt Education method: Explanation and Verbal cues, handout Education  comprehension: verbalized understanding, returned demo   HOME EXERCISE PROGRAM: Theraputty, hand writing skills, table slides  GOALS: Goals reviewed with patient? Yes   SHORT TERM GOALS: Target date: 12/18/21   Pt will be indep to perform HEP for increasing strength and coordination throughout RUE.  Baseline: Not yet initiated; 10th: putty given but pt reports limited use, instructed in table slides and self PROM for R wrist/digits.   Goal status: ongoing   2.  Pt will manage clothing fasteners with extra time, using RUE as an assist  Baseline: unable; pt has elastic laces and wears elastic waisted pants; 10th: pt uses R hand as an assist to zip and button jeans with extra time; not yet tried small buttons on a shirt.  Pt continues to use elastic laces on shoes. Goal status: ongoing   LONG TERM GOALS: Target date: 01/29/22   Pt will increase FOTO score to 55 or better to indicate improved performance with daily tasks.  Baseline: 48; 10th: 48 Goal status: ongoing   2.  Pt will increase R grip strength by 10 or more lbs to enable pt to hold and carry light ADL supplies without dropping.  Baseline: R grip 14#, L 58#; 10th visit: R grip 16 Goal status: INITIAL   3.  Pt will increase RUE strength to be able to engage RUE into ADLs at least 50% of the time. Baseline: Pt using L non-dominant arm to manage ADLs; 10th visit: Son reports he constantly sees pt attempt to use his R arm for daily tasks, but continues to be limited and still must use LUE for most tasks (see chart for RUE MMT) Goal status: ongoing   4.  Pt will complete 9 hole peg test on the R in 3 min or less to work towards ability to use R hand to pick up small ADL supplies.  Baseline: Pt can remove a peg but can not pick one up; 10th visit: R 9 hole completion  11 min 44 sec Goal status: ongoing    ASSESSMENT:  CLINICAL IMPRESSION:  Pt was able to progress today from AROM for the R wrist, to a 1# dumbbell for wrist flex/ext, and tolerated an isometric hold with the 1# weight for RD d/t pain with attempts at AROM with the # weight for RD.  No pain reported with wrist flex/ext with the weight.  Encouraged use of soup can or water bottle to practice these at home.  Pt verbalized understanding.  Continued focus on handwriting skills today.  Pt continues to require max vc for increasing letter height when tracing with the R, but fewer cues today for increasing pressure with the pen to paper and better accuracy with tracing lines.  Patient continues to work on right UE strength, grip strength, pinch strength, and fine motor coordination skills in order to improve RUE functioning, and improve engagement in, and maximize independence with ADLs and IADL tasks.  PERFORMANCE DEFICITS in functional skills including ADLs, IADLs, coordination, dexterity, ROM, strength, pain, flexibility, FMC, GMC, mobility, balance, continence, decreased knowledge of use of DME, and UE functional use, cognitive skills including safety awareness, and psychosocial skills including.   IMPAIRMENTS are limiting patient from ADLs, IADLs, leisure, and social participation.   COMORBIDITIES may have co-morbidities  that affects occupational performance. Patient will benefit from skilled OT to address above impairments and improve overall function.  MODIFICATION OR ASSISTANCE TO COMPLETE EVALUATION: Min-Moderate modification of tasks or assist with assess necessary to complete an evaluation.  OT  OCCUPATIONAL PROFILE AND HISTORY: Problem focused assessment: Including review of records relating to presenting problem.  CLINICAL DECISION MAKING: Moderate - several treatment options, min-mod task modification necessary  REHAB POTENTIAL: Good  EVALUATION COMPLEXITY: Moderate    PLAN: OT  FREQUENCY: 2x/week  OT DURATION: 12 weeks  PLANNED INTERVENTIONS: self care/ADL training, therapeutic exercise, therapeutic activity, neuromuscular re-education, manual therapy, passive range of motion, balance training, functional mobility training, moist heat, cryotherapy, patient/family education, cognitive remediation/compensation, energy conservation, coping strategies training, and DME and/or AE instructions  RECOMMENDED OTHER SERVICES: N/A  CONSULTED AND AGREED WITH PLAN OF CARE: Patient and family member/caregiver  PLAN FOR NEXT SESSION: HEP progression, neuro re-ed, therapeutic exercises   Leta Speller, MS, OTR/L  Darleene Cleaver, OT 01/21/2022, 7:05 PM

## 2022-01-22 ENCOUNTER — Encounter (INDEPENDENT_AMBULATORY_CARE_PROVIDER_SITE_OTHER): Payer: Self-pay

## 2022-01-22 ENCOUNTER — Ambulatory Visit: Payer: Medicare Other

## 2022-01-23 ENCOUNTER — Ambulatory Visit: Payer: Medicare Other | Admitting: Speech Pathology

## 2022-01-23 ENCOUNTER — Ambulatory Visit: Payer: Medicare Other

## 2022-01-23 ENCOUNTER — Ambulatory Visit: Payer: Medicare Other | Admitting: Occupational Therapy

## 2022-01-23 DIAGNOSIS — R4701 Aphasia: Secondary | ICD-10-CM

## 2022-01-23 DIAGNOSIS — R269 Unspecified abnormalities of gait and mobility: Secondary | ICD-10-CM

## 2022-01-23 DIAGNOSIS — R482 Apraxia: Secondary | ICD-10-CM | POA: Diagnosis not present

## 2022-01-23 DIAGNOSIS — M6281 Muscle weakness (generalized): Secondary | ICD-10-CM

## 2022-01-23 DIAGNOSIS — R278 Other lack of coordination: Secondary | ICD-10-CM

## 2022-01-23 DIAGNOSIS — R2681 Unsteadiness on feet: Secondary | ICD-10-CM

## 2022-01-23 DIAGNOSIS — I63512 Cerebral infarction due to unspecified occlusion or stenosis of left middle cerebral artery: Secondary | ICD-10-CM

## 2022-01-23 DIAGNOSIS — R262 Difficulty in walking, not elsewhere classified: Secondary | ICD-10-CM

## 2022-01-23 NOTE — Therapy (Signed)
OUTPATIENT SPEECH LANGUAGE PATHOLOGY TREATMENT NOTE   Patient Name: Gerald Bauer MRN: [id number] DOB:Oct 25, 1945, 76 y.o., male Today's Date: 01/23/2022  PCP: [name], PA-C REFERRING PROVIDER: Reesa Chew, PA-C  END OF SESSION:   End of Session - 01/23/22 0902     Visit Number 19    Number of Visits 25    Date for SLP Re-Evaluation 02/04/22    SLP Start Time 0900    SLP Stop Time  1000    SLP Time Calculation (min) 60 min    Activity Tolerance Patient tolerated treatment well             Past Medical History:  Diagnosis Date   Acid reflux    Arthritis    hands   Carotid atherosclerosis    Diabetes mellitus without complication ([place])    Ganglion cyst 02/23/2015   Hyperlipemia    Hypertension    Joint pain in fingers of right hand 02/23/2015   Dominant hand   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 07/11/2015   Prostate cancer ([place])    history   Stroke (cerebrum) ([place]) 10/06/2021   Tachycardia    Tinea pedis of both feet 01/10/2017   Past Surgical History:  Procedure Laterality Date   [place]  2013   PROSTATECTOMY  2012   Patient Active Problem List   Diagnosis Date Noted   Hemiparesis, aphasia, and dysphagia as late effects of stroke ([place]) 11/07/2021   Lower urinary tract symptoms (LUTS) 11/07/2021   Myalgia due to statin 02/21/2021   Type 2 diabetes mellitus without complication, without long-term current use of insulin ([place]) 02/21/2021   Aortic atherosclerosis ([place]) 04/27/2020   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   Onychomycosis of multiple toenails with type 2 diabetes mellitus ([place]) 07/19/2016   Carotid stenosis 03/13/2016   Hyperlipidemia associated with type 2 diabetes mellitus ([place])    Hypertension associated with type 2 diabetes mellitus ([place])    Personal history of prostate cancer    Carotid atherosclerosis    GERD (gastroesophageal reflux disease)  08/20/2013    ONSET DATE: 10/05/2021    REFERRING DIAG: Cerebral infarction due to unspecified occlusion of left middle cerebral artery  THERAPY DIAG:  Verbal apraxia  Aphasia Verbal apraxia  Rationale for Evaluation and Treatment Rehabilitation  SUBJECTIVE STATEMENT:             "Hey there."   Pt accompanied by: daughter in law [name]  PERTINENT HISTORY: Pt  is a 76 year old male with history of left-sided carotid stenosis, non-insulin-dependent diabetes mellitus type 2, PAD, GERD, mild COPD, history of tobacco use, hyperlipidemia, hypertension, who presented 10/05/21 to [facility] ED with facial droop, expressive aphasia, and R sided weakness and found to have left MCA CVA. He was admitted to [facility] 7/17-10/31/21. Advanced to dysphagia 3/thin liquids by cup by time of d/c from CIR. 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 Difficult to fully assess cognition given severity of linguistic impairment; however, does present with decreased safety awareness, diminished problem-solving, emergent awareness, and mild impulsivity with movement."     DIAGNOSTIC FINDINGS: MBS 10/19/21: "Pt presents with overall mild oropharyngeal dysphagia. Oral phase c/b piecemeal deglutition, decreased mastication, decreased bolus cohesion, premature spillage, and weak lingual manipulation. Pharyngeal phase c/b reduced pharyngeal peristalsis + reduced BOT approximation to PPW, which results in mild to moderate  vallecular and pyriform sinuses residuals (vallecula > pyriform sinuses for solids, pyriform sinuses > vallecula for liquids). No penetration nor aspiration appreciated with thin liquid via cup, Dysphagia 1 (puree), Dysphagia 2 (chopped), Dysphagia 3 (mechanical soft), or with 13 mm Barium tablet placed in puree. Once instance of sensed aspiration with intake of large bolus of thin liquid via straw due to swallow initiation  delay to pyriform sinuses and mistiming of swallow-breath cycle; despite strong reflexive cough aspirant was not fully cleared from trachea. Pt's level of swallow initiation was noted to vary with liquid consumption (over base of epiglottis and at pyriform sinuses) with level of swallow initiation for solids to be consistent at the vallecula. Pharyngeal stasis was partially cleared with reflexive secondary swallow." MRI brain 10/05/21: 1. 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 No associated hemorrhage or significant regional mass effect. 2. Loss of normal flow voids throughout the left ICA and MCA, consistent with slow flow and/or occlusion. 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 3. Underlying mild chronic microvascular ischemic disease. Tiny remote left ACA distribution infarct.  PAIN:  Are you having pain? No  PATIENT GOALS talk better   OBJECTIVE:    TODAY'S TREATMENT:  Skilled ST treatment targeted verbal apraxia and aphasia. Pt read /pl/ and /sl/ blends at word level 80% acc, improves to 100% with min cues to reattempt errors. At sentence level, pt benefits from audio recording/playback for awareness of increasing rate impacting articulatory errors. Verbal expression: sentence generation re: pictures were intelligible to SLP 60% of the time without context. With context (image or verb), intelligibility increases to 80%. Pt benefitted from visual cues with Subject- verb- object framework. Patient shared that it was hard to talk; validated pt's feelings about this and worked with pt to generate list of questions to ask his daughter. Pt read these with occasional mod cues for slower rate, using visual cues for tapping each syllable.   PATIENT EDUCATION: Education details: see today's treatment for details Person educated: Patient Education method: Explanation, Demonstration, and written supports Education  comprehension: verbalized understanding and needs further education     GOALS: Goals reviewed with patient? Yes   SHORT TERM GOALS: Target date: 10 sessions   Patient will participate in clinical assessment of swallow function with goals added as needed. Baseline: Goal status: MET   2.  Pt will communicate emergency information 100% accuracy using visual aid if necessary.  Baseline:  Goal status: IN PROGRESS (partially met, will continue; 70% intelligible with mod-max cues)   3.  Pt will approximate personally relevant words and phrases >80% accuracy using script training and min visual cues for apraxia.   Baseline:  Goal status: MET   4.  Pt will generate at least 4 descriptors of target word 80% of the time using semantic feature analysis to improve abilities in wordfinding and resolving communication breakdowns.  Baseline:  Goal status: MET   5.  Pt will complete HEP for dysphagia with rare min A. Baseline:  Goal status: MET   6.  Pt will generate sentences using personally relevant words list for >80% intelligibility. Baseline: 50% intelligibility Goal status: INITIAL   5.  Pt will improve expressive language skills by taking 4-6 turns in simple conversation, with supported conversation strategies if needed. Baseline:  Goal status: INITIAL       LONG TERM GOALS: Target date: 02/04/2022   Pt will engage in 5-8 minutes simple-mod complex conversation re: topic of interest  with supported conversation, aphasia compensations.  Baseline:  Goal status: IN PROGRESS   2.  Pt will ID and attempt repair of communication breakdowns >90% of the time in session.    Baseline:  Goal status: IN PROGRESS   3.  Pt/family will demonstrate knowledge of community resources and activities to support language/communication. Baseline:  Goal status: IN PROGRESS   4.  Pt will demonstrate use of swallowing precautions independently with s/sx aspiration <5% of trials. Baseline:  Goal  status: IN PROGRESS     ASSESSMENT:   CLINICAL IMPRESSION: Patient presents with moderate Broca's aphasia, moderate verbal apraxia, and mild dysarthria. With encouragement, extended time and mod cues, pt able to repair of breakdowns using multimodal means. Pt now initiating use of strategies to slow rate and improve accuracy of verbal output. Continues to improve error awareness and attempts at self-correction. Patient remains limited in his ability to relay thoughts, feelings, and non-contextual or abstract information. Pt reports resuming regular diet and pt/daughter-in-law deny swallowing difficulty at home. Pt appears motivated and is demonstrating excellent progress toward goals. I recommend skilled ST to improve pt's language and motor speech skills to improve communication and reduce frustration.    OBJECTIVE IMPAIRMENTS include expressive language, receptive language, aphasia, apraxia, dysarthria, and dysphagia. These impairments are limiting patient from managing medications, managing appointments, managing finances, household responsibilities, ADLs/IADLs, effectively communicating at home and in community, and safety when swallowing. Factors affecting potential to achieve goals and functional outcome are cooperation/participation level; pt appears highly motivated with good family support. Patient will benefit from skilled SLP services to address above impairments and improve overall function.   REHAB POTENTIAL: Excellent   PLAN: SLP FREQUENCY: 2x/week   SLP DURATION: 12 weeks   PLANNED INTERVENTIONS: Aspiration precaution training, Pharyngeal strengthening exercises, Diet toleration management , Language facilitation, Environmental controls, Trials of upgraded texture/liquids, Cognitive reorganization, Internal/external aids, Functional tasks, Multimodal communication approach, SLP instruction and feedback, Compensatory strategies, and Patient/family education   [name], MS,  CCC-SLP Speech-Language Pathologist [phone]   [name], [place] 01/23/2022, 9:03 AM  [facility] [address] [place], [place], [address] Phone: [phone]   Fax:  [phone]

## 2022-01-23 NOTE — Therapy (Addendum)
OUTPATIENT PHYSICAL THERAPY NEURO TREATMENT NOTE   Patient Name: Gerald Bauer MRN: [id number] DOB:1945-10-30, 76 y.o., male Today's Date: 01/23/2022   PCP: [name], PA-C REFERRING PROVIDER: Bary Leriche, PA-C   PT End of Session - 01/23/22 1023     Visit Number 12    Number of Visits 24    Date for PT Re-Evaluation 02/16/22    Progress Note Due on Visit 10    PT Start Time 1018    PT Stop Time 1100    PT Time Calculation (min) 42 min    Equipment Utilized During Treatment Gait belt    Activity Tolerance Patient tolerated treatment well    Behavior During Therapy WFL for tasks assessed/performed              Past Medical History:  Diagnosis Date   Acid reflux    Arthritis    hands   Carotid atherosclerosis    Diabetes mellitus without complication ([place])    Ganglion cyst 02/23/2015   Hyperlipemia    Hypertension    Joint pain in fingers of right hand 02/23/2015   Dominant hand   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 07/11/2015   Prostate cancer ([place])    history   Stroke (cerebrum) ([place]) 10/06/2021   Tachycardia    Tinea pedis of both feet 01/10/2017   Past Surgical History:  Procedure Laterality Date   [place]  2013   PROSTATECTOMY  2012   Patient Active Problem List   Diagnosis Date Noted   Hemiparesis, aphasia, and dysphagia as late effects of stroke ([place]) 11/07/2021   Lower urinary tract symptoms (LUTS) 11/07/2021   Myalgia due to statin 02/21/2021   Type 2 diabetes mellitus without complication, without long-term current use of insulin ([place]) 02/21/2021   Aortic atherosclerosis ([place]) 04/27/2020   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   Onychomycosis of multiple toenails with type 2 diabetes mellitus ([place]) 07/19/2016   Carotid stenosis 03/13/2016   Hyperlipidemia associated with type 2 diabetes mellitus ([place])    Hypertension associated with type 2 diabetes  mellitus ([place])    Personal history of prostate cancer    Carotid atherosclerosis    GERD (gastroesophageal reflux disease) 08/20/2013    ONSET DATE: 10/05/21  REFERRING DIAG: T62.446 (ICD-10-CM) - Cerebral infarction due to unspecified occlusion or stenosis of left middle cerebral artery  THERAPY DIAG:  Difficulty in walking, not elsewhere classified  Muscle weakness (generalized)  Unsteadiness on feet  Abnormality of gait and mobility  Rationale for Evaluation and Treatment Rehabilitation  SUBJECTIVE:  SUBJECTIVE STATEMENT:  Pt reports having 3/10 shoulder pain.  [name] notes that it has eased off a little since this morning.  Pt accompanied by: self and [name] (DTR)  PAIN today:  Are you having pain? Yes: NPRS scale: 3/10 Pain location: bil shoulders, achey/dull, sharp  PERTINENT HISTORY:Per H and P from 7/17 hospital d/c Pt  is a 76 year old male with history of left-sided carotid stenosis, non-insulin-dependent diabetes mellitus type 2, PAD, GERD, mild COPD, history of tobacco use, hyperlipidemia, hypertension, who presents emergency department for chief concerns of facial droop, expressive aphasia, and R sided weakness  PRECAUTIONS: Fall  WEIGHT BEARING RESTRICTIONS No  FALLS: Has patient fallen in last 6 months? Yes. Number of falls 1  LIVING ENVIRONMENT: Lives with: lives with their family and lived alone prior to onset  Lives in: House/apartment Stairs: Yes: External: 4 steps; can reach both Has following equipment at home: Walker - 2 wheeled and shower chair  PLOF: Independent  PATIENT GOALS become independent with everyday activities like he was prior to the onset of the stroke   OBJECTIVE:   DIAGNOSTIC FINDINGS: 1. Moderate sized acute ischemic left MCA distribution  infarct as  above. No associated hemorrhage or significant regional mass effect.  2. Loss of normal flow voids throughout the left ICA and MCA,  consistent with slow flow and/or occlusion. Susceptibility artifact  within proximal left MCA branches consistent with intraluminal  thrombus.  3. Underlying mild chronic microvascular ischemic disease. Tiny  remote left ACA distribution infarct.     MRA HEAD IMPRESSION:   "1. Interval near complete occlusion of the left ICA and MCA,  presumably acute in nature given the presence of the acute left MCA  territory infarct. Left A1 segment not well seen either, also likely  occluded.  2. Otherwise wide patency of the major intracranial arterial  circulation. No other hemodynamically significant or correctable  stenosis.     MRA NECK IMPRESSION:  1. 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 Some attenuated flow seen  distally within the cervical left ICA, with subsequent reocclusion  by the skull base.  2. 50% atheromatous stenosis at the origin of the cervical right  ICA. Otherwise wide patency of the right carotid artery system.  3. Wide patency of the vertebral arteries within the neck.".    COGNITION: Overall cognitive status: Within functional limits for tasks assessed and subjective history limited secondary to aphasia but able to answer 1 word questions appropriately.    SENSATION: WFL  COORDINATION: WNL with heel to shin testing    LOWER EXTREMITY ROM:   AROM WNL    LOWER EXTREMITY MMT:    MMT Right Eval Left Eval  Hip flexion 4 4+  Hip extension    Hip abduction 4 5  Hip adduction 4 5  Hip internal rotation    Hip external rotation    Knee flexion 4 5  Knee extension 4 5  Ankle dorsiflexion 4+ 5  Ankle plantarflexion 4+ 5  Ankle inversion 5 5  Ankle eversion 5 5  (Blank rows = not tested)   TRANSFERS: Assistive device utilized: Environmental consultant - 2 wheeled  Sit to stand: Modified  independence Stand to sit: Complete Independence Chair to chair: SBA  STAIRS:  Level of Assistance: SBA Stair Negotiation Technique: Step to Pattern with Bilateral Rails Number of Stairs: 4  Height of Stairs: 6 in  Comments: Right lower extremity foot catches when  a sending steps and patient performs descending steps utilizing right lower extremity for more support.  Patient reports he knows he is doing this "the hard way"  GAIT: Gait pattern: decreased arm swing- Right and decreased step length- Right Distance walked: 10 M Assistive device utilized: Environmental consultant - 2 wheeled and None Level of assistance: Modified independence Comments: difficulty with turns and object navigation per DGI tesitng, able to walk without AD but only in controlled environment without obstacles or significant challenge  FUNCTIONAL TESTs:  5 times sit to stand: 16.95 sec no UE assist 10 meter walk test: .76 m/s without AD  Berg Balance Scale: 44/56 DGI:10  Progress Note 10/10: 5x STS: 13sec no UE support DGI:14/24 BERG: 50/56 10 meter walk test: .09 m/s without AD (normal speed)   PATIENT SURVEYS:  FOTO to retest next session, likely with either asking patient questions or having caregiver complete patient completed but had 97% indicating he could run and jump with these and when questioned about this following patient reports he cannot run or jump with these and would benefit from filling this out again next time.  12/04/2021: 49 (goal 62) 01/02/2022: 92 (goal 62) 01/23/2022: 56 (goal 62)    TODAY'S TREATMENT:   Gait belt donned, CGA provided unless noted otherwise  NMR: Seated BP prior to session 139/99 mmHg  HR 99 bpm  Seated BP following ambulation 126/94 mmHg HR 116 bpm   56f ambulation in hallway without AD: backwards ambulation length of hallway x4 side stepping of hallway x4 high knees length of hallway x4  Resisted cable column walks with red mat placed on floor, x5 each  direction, 12.5#  PERFORM FGA AT NEXT VISIT.     PATIENT EDUCATION: Education details: Pt educated throughout session about proper posture and technique with exercises. Improved exercise technique, movement at target joints, use of target muscles after min to mod verbal, visual, tactile cues.   Person educated: Patient Education method: Explanation, Demonstration, Tactile cues, and Verbal cues Education comprehension: verbalized understanding, returned demonstration, verbal cues required, tactile cues required, and needs further education   HOME EXERCISE PROGRAM:  No updates today, pt to continue as previously given  9/11: Pt to continue HEP as previously given. However, PT did provide education to use a chair with arm-rests for lateral support when performing STS  10/3: added standing hip ABD/ext with UE support on counter 10/26: updated as below  Access Code: [email or web address] URL: [email or web address] Date: 01/18/2022 Prepared by: JRaleigh Nation Exercises - Prone Hip Extension  - 1 x daily - 7 x weekly - 3 sets - 10 reps - Standing Hip Abduction with Resistance at Ankles and Counter Support  - 1 x daily - 7 x weekly - 3 sets - 10 reps - Standing Hip Extension with Resistance at Ankles and Counter Support  - 1 x daily - 7 x weekly - 3 sets - 10 reps - Side Stepping with Resistance at Ankles and Counter Support  - 1 x daily - 7 x weekly - 3 sets - 10 reps - Forward and Backward Monster Walk with Resistance at Ankles and Counter Support  - 1 x daily - 7 x weekly - 3 sets - 10 reps - Forward Monster Walk with Resistance at TSun Microsystemsand Counter Support  - 1 x daily - 7 x weekly - 3 sets - 10 reps    GOALS: Goals reviewed with patient? Yes  SHORT TERM GOALS: Target date: 12/22/2021   Patient will be independent  in home exercise program to improve strength/mobility for better functional independence with ADLs. Baseline: No HEP currently, (10/10) not compliant Goal status:  INITIAL   LONG TERM GOALS: Target date: 02/16/2022  1.  Patient (> 59 years old) will complete five times sit to stand test in < 15 seconds indicating an increased LE strength and improved balance. Baseline: 16.95 01/02/22: 13 sec Goal status: MET  2.  Patient will increase FOTO score to equal to or greater than  60   to demonstrate statistically significant improvement in mobility and quality of life.  Baseline: Test next session 01/02/22: 92 Goal status: MET   3.  Patient will increase Berg Balance score by > 6 points to demonstrate decreased fall risk during functional activities. Baseline: 44 01/02/22: 50/56 Goal status: MET   4.  Patient will improve DGI by 5 points or more to reduce fall risk and demonstrate improved gait ability. Baseline: 10 01/02/22: 14 Goal status: PROGRESSING  5.  Patient will increase 10 meter walk test to >1.87ms as to improve gait speed for better community ambulation and to reduce fall risk. Baseline: .72m 01/02/22: 0.9 m/s Goal status: PROGRESSING  6.  Patient will increase Berg Balance score by > 6 points to demonstrate decreased fall risk during functional activities. Baseline: 44 01/02/22: 50/56 Goal status: PROGRESSING  7. FGA goal   ASSESSMENT:  CLINICAL IMPRESSION:   Pt able to navigate 2 flights of stairs during session today and responded well with good safety measures while performing.  Pt tends to utilize step-to method when descending and alternating pattern when ascending.  Pt also making advancement with gait pattern and strength while performing tasks.  Pt performed cable column walk-outs onto red mat table as well and only experienced difficulty with lateral stepping that required minA to prevent uncontrolled descent to the floor.  Pt advised to take things slow and make sure he has his balance before continuing.   Pt will continue to benefit from skilled therapy to address remaining deficits in order to improve overall QoL  and return to PLOF.      OBJECTIVE IMPAIRMENTS Abnormal gait, cardiopulmonary status limiting activity, decreased activity tolerance, decreased balance, decreased endurance, decreased mobility, difficulty walking, decreased strength, and decreased safety awareness.   ACTIVITY LIMITATIONS lifting, bending, standing, squatting, stairs, transfers, and locomotion level  PARTICIPATION LIMITATIONS: meal prep, shopping, and community activity  PERSONAL FACTORS Age and 3+ comorbidities: left-sided carotid stenosis, non-insulin-dependent diabetes mellitus type 2, PAD, GERD, mild COPD, history of tobacco use, hyperlipidemia, hypertension  are also affecting patient's functional outcome.   REHAB POTENTIAL: Good  CLINICAL DECISION MAKING: Evolving/moderate complexity  EVALUATION COMPLEXITY: Moderate   PLAN:  PT FREQUENCY: 2x/week  PT DURATION: 12 weeks  PLANNED INTERVENTIONS: Therapeutic exercises, Therapeutic activity, Neuromuscular re-education, Balance training, Gait training, Patient/Family education, Self Care, Joint mobilization, Stair training, Moist heat, and Manual therapy  PLAN FOR NEXT SESSION: FGA test, continue with functional/dynamic balance interventions   JoGwenlyn SaranPT, DPT Physical Therapist- [facility]10/31/23, 5:04 PM

## 2022-01-23 NOTE — Therapy (Addendum)
OUTPATIENT OCCUPATIONAL THERAPY NEURO TREATMENT/ PROGRESS UPDATE REPORTING PERIOD FROM 12/11/2021 TO 78/29/5621 RECERTIFICATION  Patient Name: Gerald Bauer MRN: [id number] DOB:11-23-1945, 76 y.o., male Today's Date: 01/23/2022  PCP: Threasa Alpha, PA REFERRING PROVIDER: Reesa Chew   OT End of Session - 01/23/22 1834     Visit Number 20    Number of Visits 73   Date for OT Re-Evaluation 04/17/2022   Authorization Time Period Reporting period beginning 12/11/21    OT Start Time 22    OT Stop Time 1146    OT Time Calculation (min) 46 min    Activity Tolerance Patient tolerated treatment well    Behavior During Therapy [facility] for tasks assessed/performed             Past Medical History:  Diagnosis Date   Acid reflux    Arthritis    hands   Carotid atherosclerosis    Diabetes mellitus without complication ([place])    Ganglion cyst 02/23/2015   Hyperlipemia    Hypertension    Joint pain in fingers of right hand 02/23/2015   Dominant hand   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 07/11/2015   Prostate cancer ([place])    history   Stroke (cerebrum) ([place]) 10/06/2021   Tachycardia    Tinea pedis of both feet 01/10/2017   Past Surgical History:  Procedure Laterality Date   [place]  2013   PROSTATECTOMY  2012   Patient Active Problem List   Diagnosis Date Noted   Hemiparesis, aphasia, and dysphagia as late effects of stroke ([place]) 11/07/2021   Lower urinary tract symptoms (LUTS) 11/07/2021   Myalgia due to statin 02/21/2021   Type 2 diabetes mellitus without complication, without long-term current use of insulin ([place]) 02/21/2021   Aortic atherosclerosis ([place]) 04/27/2020   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   Onychomycosis of multiple toenails with type 2 diabetes mellitus ([place]) 07/19/2016   Carotid stenosis 03/13/2016   Hyperlipidemia associated with type 2 diabetes mellitus ([place])     Hypertension associated with type 2 diabetes mellitus ([place])    Personal history of prostate cancer    Carotid atherosclerosis    GERD (gastroesophageal reflux disease) 08/20/2013    ONSET DATE: 10/05/21  REFERRING DIAG: L MCA CVA  THERAPY DIAG:  Muscle weakness (generalized)  Other lack of coordination  Unsteadiness on feet  Left middle cerebral artery stroke ([place])  Rationale for Evaluation and Treatment Rehabilitation  SUBJECTIVE:    SUBJECTIVE STATEMENT:  Pt reports he is doing well, still has pain in his shoulder which limits his activity at home and in the community.  He would like to decrease pain and improve his use of his hand and arm for functional tasks at home.      PERTINENT HISTORY: Per chart, Gerald Bauer is a 76 year old right-handed male with history of hypertension, hyperlipidemia, diabetes mellitus, prostate cancer/prostatectomy 2012, quit smoking 24 years ago.  [facility] with right side weakness/facial droop and expressive aphasia.  Blood pressure 155/102.  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  No associated hemorrhage or significant mass effect.    PAIN:  Are you having pain?  5/10 R shoulder; heat, rest, gentle stretch affective for pain management FALLS: Has patient fallen in last 6 months? Yes. Number of falls 3 since CVA  PLOF: Independent/retired Dealer  PATIENT GOALS : Pt points to his hand (  wants to be able to use his hand)  OBJECTIVE:   HAND DOMINANCE: Right   FUNCTIONAL OUTCOME MEASURES: FOTO: 48  UUPPER EXTREMITY ROM      Active ROM Right eval Left Eval WNL Right 12/11/21 Right 01/23/2022  Shoulder flexion 90 (135)   95 (110) 95  Shoulder abduction 95 (110)   80 (90) 95  Wrist flex     54 60  Wrist ext     41 45    UPPER EXTREMITY MMT:      MMT Right eval Left Eval 5/5 Right 12/11/21  Shoulder flexion 3-   -3  Shoulder abduction 3-   -3  Shoulder  adduction        Shoulder extension        Shoulder internal rotation 3-      Shoulder external rotation 3-      Middle trapezius        Lower trapezius        Elbow flexion 4-   4  Elbow extension 4+   5  Wrist flexion 4   NT d/t pain  Wrist extension 4-   NT d/t pain  (Blank rows = not tested)   HAND FUNCTION: Eval:         Grip strength: Right: 14 lbs; Left: 58 lbs, Lateral pinch: Right: 7 lbs, Left: 19 lbs, and 3 point pinch: Right: 2 lbs, Left: 16 lbs 10th visit: Grip strength: Right: 16  lbs;                                Lateral pinch: Right: 9  lbs;                            3 point pinch: Right: 5 lbs 20th visit: Grip strength: Right: 20 lbs; Left: 70 lbs, Lateral pinch: Right: 10 lbs, Left: 20 lbs, and 3 point pinch: Right: 9 lbs, Left: 20 lbs    Eval: COORDINATION: Finger Nose Finger test: difficult/lacks precision with reaching targets 9 Hole Peg test: Right: unable sec; Left: 27 sec Able to oppose 2nd and 3rd digits to thumb on R hand   10th visit 9 hole Peg test: Right: 11 min, 44 sec   20th visit:  9 hole Peg test: Right: 3 min 6 sec    TODAY'S TREATMENT:   Manual Therapy:  Scapular mobilizations for scapular retraction, protraction, and upwards rotation followed by AAROM for same scapular movement patterns.  Performed in sidelying.  Therapeutic Exercise: Following manual therapy, Pt seen this date for reassessment of hand skills and UE function, please see above measurements with comparison to other UE and past measurements.   Pt seen for PROM to right shoulder for shoulder flexion to 90 degrees, ABD to 90 degrees followed by AAROM for shoulder flexion, ABD, place and hold, control of arm with return from flexion.    9 hole peg test demonstrating significant progress since last progress update.      PATIENT EDUCATION: Education details: HEP progression Person educated: pt Education method: Merchandiser, retail cues, handout Education comprehension:  verbalized understanding, returned demo   HOME EXERCISE PROGRAM: Theraputty, hand writing skills, table slides  GOALS: Goals reviewed with patient? Yes   SHORT TERM GOALS: Target date: /23   Pt will be indep to perform HEP for increasing strength and coordination throughout RUE.  Baseline: Not yet initiated;  10th: putty given but pt reports limited use, instructed in table slides and self PROM for R wrist/digits. 20th:  Pt performing exercises but requires continual upgrades and instruction  Goal status: ongoing   2.  Pt will manage clothing fasteners with extra time, using RUE as an assist  Baseline: unable; pt has elastic laces and wears elastic waisted pants; 10th: pt uses R hand as an assist to zip and button jeans with extra time; not yet tried small buttons on a shirt.  Pt continues to use elastic laces on shoes. 20th:  Improving with manipulation skills but still using elastic laces, able to button pants and shirt with increased time and effort.  Goal status: ongoing   LONG TERM GOALS: Target date: /23   Pt will increase FOTO score to 55 or better to indicate improved performance with daily tasks.  Baseline: 48; 10th: 48; 20th: score:42  Goal status: ongoing   2.  Pt will increase R grip strength by 10 or more lbs to enable pt to hold and carry light ADL supplies without dropping.  Baseline: R grip 14#, L 58#; 10th visit: R grip 16; 20th visit: 20# Goal status: ongoing   3.  Pt will increase RUE strength to be able to engage RUE into ADLs at least 50% of the time. Baseline: Pt using L non-dominant arm to manage ADLs; 10th visit: Son reports he constantly sees pt attempt to use his R arm for daily tasks, but continues to be limited and still must use LUE for most tasks (see chart for RUE MMT) 20th:  continues to improve on strength and is working on consistency of use  Goal status: ongoing   4.  Pt will complete 9 hole peg test on the R in 3 min or less to work towards  ability to use R hand to pick up small ADL supplies.  Baseline: Pt can remove a peg but can not pick one up; 10th visit: R 9 hole completion 11 min 44 sec; 20th visit:   Pt improved to 3 mins and 6 secs, nearing goal.  Goal status: ongoing    ASSESSMENT:  CLINICAL IMPRESSION: Pt has continued to improve in all areas.  He continues to report increased pain in right shoulder and UE which is the primary limitation for use in daily tasks.  Will plan to work over the next few sessions to decrease pain to improve use in functional tasks.  He has made significant progress with coordination with improving his 9 hole peg test from unable to complete to 11 mins 44 sec at 10th visit to now being able to complete in 3 mins and 6 seconds.  He has improved his right grip strength by 6# since evaluation.  He is participating and engaging in daily activities with greater frequency however pain is a limiting factor.  Pt remains slower in completion of tasks and utilizes more deliberate movement patterns but has continued to improve with self care and manipulation tasks over time.  Pt continues to benefit from skilled OT services to maximize safety and independence in necessary daily tasks.     PERFORMANCE DEFICITS in functional skills including ADLs, IADLs, coordination, dexterity, ROM, strength, pain, flexibility, FMC, GMC, mobility, balance, continence, decreased knowledge of use of DME, and UE functional use, cognitive skills including safety awareness, and psychosocial skills including.   IMPAIRMENTS are limiting patient from ADLs, IADLs, leisure, and social participation.   COMORBIDITIES may have co-morbidities  that affects occupational performance. Patient  will benefit from skilled OT to address above impairments and improve overall function.  MODIFICATION OR ASSISTANCE TO COMPLETE EVALUATION: Min-Moderate modification of tasks or assist with assess necessary to complete an evaluation.  OT OCCUPATIONAL  PROFILE AND HISTORY: Problem focused assessment: Including review of records relating to presenting problem.  CLINICAL DECISION MAKING: Moderate - several treatment options, min-mod task modification necessary  REHAB POTENTIAL: Good  EVALUATION COMPLEXITY: Moderate    PLAN: OT FREQUENCY: 2x/week  OT DURATION: 12 weeks  PLANNED INTERVENTIONS: self care/ADL training, therapeutic exercise, therapeutic activity, neuromuscular re-education, manual therapy, passive range of motion, balance training, functional mobility training, moist heat, cryotherapy, patient/family education, cognitive remediation/compensation, energy conservation, coping strategies training, and DME and/or AE instructions  RECOMMENDED OTHER SERVICES: N/A  CONSULTED AND AGREED WITH PLAN OF CARE: Patient and family member/caregiver  PLAN FOR NEXT SESSION: HEP progression, neuro re-ed, therapeutic exercises   Gerald Bauer, OTR/L, CLT Gerald Bauer, OT 01/23/2022, 6:38 PM

## 2022-01-25 ENCOUNTER — Ambulatory Visit: Payer: Medicare Other

## 2022-01-25 ENCOUNTER — Ambulatory Visit: Payer: Medicare Other | Attending: Physical Medicine and Rehabilitation | Admitting: Speech Pathology

## 2022-01-25 DIAGNOSIS — R41841 Cognitive communication deficit: Secondary | ICD-10-CM | POA: Insufficient documentation

## 2022-01-25 DIAGNOSIS — R482 Apraxia: Secondary | ICD-10-CM | POA: Insufficient documentation

## 2022-01-25 DIAGNOSIS — M6281 Muscle weakness (generalized): Secondary | ICD-10-CM | POA: Diagnosis present

## 2022-01-25 DIAGNOSIS — I63512 Cerebral infarction due to unspecified occlusion or stenosis of left middle cerebral artery: Secondary | ICD-10-CM | POA: Diagnosis present

## 2022-01-25 DIAGNOSIS — R2681 Unsteadiness on feet: Secondary | ICD-10-CM | POA: Insufficient documentation

## 2022-01-25 DIAGNOSIS — R278 Other lack of coordination: Secondary | ICD-10-CM | POA: Insufficient documentation

## 2022-01-25 DIAGNOSIS — R262 Difficulty in walking, not elsewhere classified: Secondary | ICD-10-CM | POA: Diagnosis present

## 2022-01-25 DIAGNOSIS — R4701 Aphasia: Secondary | ICD-10-CM | POA: Insufficient documentation

## 2022-01-25 DIAGNOSIS — R1312 Dysphagia, oropharyngeal phase: Secondary | ICD-10-CM | POA: Insufficient documentation

## 2022-01-25 DIAGNOSIS — R269 Unspecified abnormalities of gait and mobility: Secondary | ICD-10-CM

## 2022-01-25 NOTE — Therapy (Addendum)
OUTPATIENT SPEECH LANGUAGE PATHOLOGY TREATMENT, RECERTIFICATION AND PROGRESS NOTE   Patient Name: Gerald Bauer MRN: [id number] DOB:09-Aug-1945, 76 y.o., male Today's Date: 01/25/2022  PCP: [name], PA-C REFERRING PROVIDER: Reesa Chew, PA-C  Speech Therapy Progress Note  Dates of Reporting Period: 12/18/2021 to 01/25/2022   Objective: Patient has been seen for 10 speech therapy sessions this reporting period targeting verbal apraxia and aphasia. Patient is making progress toward LTGs and met 3/3 active STGs this reporting period. Patient remains motivated for improvement and completes all assigned home activities. Continues to make gains in verbal expression and intelligibility; will recert for additional 12 weeks. See skilled intervention, clinical impressions, and updated goals below for details.   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 07/11/2015   Prostate cancer ([place])    history   Stroke (cerebrum) ([place]) 10/06/2021   Tachycardia    Tinea pedis of both feet 01/10/2017   Past Surgical History:  Procedure Laterality Date   APPENDECTOMY  1973   CHOLECYSTECTOMY  2013   PROSTATECTOMY  2012   Patient Active Problem List   Diagnosis Date Noted   Hemiparesis, aphasia, and dysphagia as late effects of stroke ([place]) 11/07/2021   Lower urinary tract  symptoms (LUTS) 11/07/2021   Myalgia due to statin 02/21/2021   Type 2 diabetes mellitus without complication, without long-term current use of insulin ([place]) 02/21/2021   Aortic atherosclerosis ([place]) 04/27/2020   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   Onychomycosis of multiple toenails with type 2 diabetes mellitus ([place]) 07/19/2016   Carotid stenosis 03/13/2016   Hyperlipidemia associated with type 2 diabetes mellitus ([place])    Hypertension associated with type 2 diabetes mellitus ([place])    Personal history of prostate cancer    Carotid atherosclerosis    GERD (gastroesophageal reflux disease) 08/20/2013    ONSET DATE: 10/05/2021    REFERRING DIAG: Cerebral infarction due to unspecified occlusion of left middle cerebral artery  THERAPY DIAG:  Verbal apraxia  Aphasia Verbal apraxia  Rationale for Evaluation and Treatment Rehabilitation  SUBJECTIVE STATEMENT:             "Hey there."   Pt accompanied by: daughter in Physicist, medical  PERTINENT HISTORY: Pt  is a 76 year old male with history of left-sided carotid stenosis, non-insulin-dependent diabetes mellitus type 2, PAD, GERD, mild COPD, history of tobacco use, hyperlipidemia, hypertension, who presented 10/05/21 to [facility] ED with facial droop, expressive aphasia, and R sided weakness and found to have left MCA CVA. He was admitted to [facility] 7/17-10/31/21. Advanced to dysphagia 3/thin liquids by cup by time of d/c from CIR. 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 Difficult to fully assess cognition given severity of linguistic impairment; however, does present with decreased safety awareness, diminished problem-solving, emergent awareness, and mild impulsivity with movement."     DIAGNOSTIC FINDINGS: MBS 10/19/21: "Pt presents with overall mild oropharyngeal dysphagia. Oral phase c/b piecemeal deglutition, decreased mastication, decreased bolus cohesion,  premature spillage, and weak lingual manipulation. Pharyngeal phase c/b reduced pharyngeal peristalsis + reduced BOT approximation to PPW, which results in mild to moderate vallecular and pyriform sinuses residuals (vallecula > pyriform sinuses for solids, pyriform sinuses > vallecula for liquids). No penetration nor aspiration appreciated with thin liquid via cup, Dysphagia 1 (puree), Dysphagia 2 (chopped), Dysphagia 3 (mechanical soft), or with 13 mm Barium tablet placed in puree. Once instance of sensed aspiration with intake of large bolus of thin liquid via straw due to swallow initiation delay to pyriform sinuses and mistiming of swallow-breath cycle; despite strong reflexive cough aspirant was not fully cleared from trachea. Pt's level of swallow initiation was noted to vary with liquid consumption (over base of epiglottis and at pyriform sinuses) with level of swallow initiation for solids to be consistent at the vallecula. Pharyngeal stasis was partially cleared with reflexive secondary swallow." MRI brain 10/05/21: 1. 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 No associated hemorrhage or significant regional mass effect. 2. Loss of normal flow voids throughout the left ICA and MCA, consistent with slow flow and/or occlusion. 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 3. Underlying mild chronic microvascular ischemic disease. Tiny remote left ACA distribution infarct.  PAIN:  Are you having pain? No  PATIENT GOALS talk better   OBJECTIVE:    TODAY'S TREATMENT:  Skilled ST treatment targeted verbal apraxia and aphasia. Facilitated simple-mod complex conversation re: hobbies (hiking, traveling). Utilized multimodal communication (map, written keywords) to support pt in turn-taking (5 turns). Pt generated sentences, 80% intelligibility for 5-6 word sentences. For long, more complex sentences, pt required mod cues to slow  rate and for syntax (articles, tenses, noun/verb agreement).    PATIENT EDUCATION: Education details: see today's treatment for details Person educated: Patient Education method: Explanation, Demonstration, and written supports Education comprehension: verbalized understanding and needs further education     GOALS: Goals reviewed with patient? Yes   SHORT TERM GOALS: Target date: 10 sessions   Patient will participate in clinical assessment of swallow function with goals added as needed. Baseline: Goal status: MET   2.  Pt will communicate emergency information 100% accuracy using visual aid if necessary.  Baseline:  Goal status: MET   3.  Pt will approximate personally relevant words and phrases >80% accuracy using script training and min visual cues for apraxia.   Baseline:  Goal status: MET   4.  Pt will generate at least 4 descriptors of target word 80% of the time using semantic feature analysis to improve abilities in wordfinding and resolving communication breakdowns.  Baseline:  Goal status: MET   5.  Pt will complete HEP for dysphagia with rare min A. Baseline:  Goal status: MET   6.  Pt will generate sentences using personally relevant words list for >80% intelligibility. Baseline: 50% intelligibility Goal status: MET   7.  Pt will improve expressive language skills by taking 4-6 turns in simple conversation, with supported conversation strategies if needed. Baseline:  Goal status: MET 8.  Pt will generate sentences moderately complex sentences 8-10 words >80% intelligibility,  with self-correction/multimodal communication allowed. Baseline: 50% intelligibility Goal status: INITIAL   9.  Patient will use intelligibility strategies and script training for 4-6 turn telephone exchange with family member or friend. Baseline:  Goal status: INITIAL     LONG TERM GOALS: Target date: 04/25/2022    Pt will engage in 5-8 minutes simple-mod complex conversation re:  topic of interest with supported conversation, aphasia compensations.  Baseline: 01/25/22: 5 turn exchange. Goal renewed 01/25/22 Goal status: IN PROGRESS   2.  Pt will ID and attempt repair of communication breakdowns >90% of the time in session.    Baseline: 01/25/22: requires occasional mod cues to initiate correction; renewed 01/25/22 Goal status: IN PROGRESS   3.  Pt/family will demonstrate knowledge of community resources and activities to support language/communication. Baseline: 01/25/22: referral completed to [facility]; pt has yet to register for group, renewed 01/25/22 Goal status: IN PROGRESS   4.  Pt will demonstrate use of swallowing precautions independently with s/sx aspiration <5% of trials. Baseline: 01/25/22: no overt s/sx when sipping water in sessions, pt and family report no issues at home with meals Goal status: DEFERRED      ASSESSMENT:   CLINICAL IMPRESSION: Patient presents with moderate Broca's aphasia, moderate verbal apraxia, and mild dysarthria. With encouragement, extended time and mod cues, pt able to repair of breakdowns using multimodal means. Pt now initiating use of strategies to slow rate and improve accuracy of verbal output. Continues to improve error awareness and attempts at self-correction. Patient remains limited in his ability to relay thoughts, feelings, and non-contextual or abstract information. Pt reports resuming regular diet and pt/daughter-in-law deny swallowing difficulty at home. Pt appears motivated and is demonstrating excellent progress toward goals. I recommend skilled ST to improve pt's language and motor speech skills to improve communication and reduce frustration.    OBJECTIVE IMPAIRMENTS include expressive language, receptive language, aphasia, apraxia, dysarthria, and dysphagia. These impairments are limiting patient from managing medications, managing appointments, managing finances, household responsibilities, ADLs/IADLs, effectively  communicating at home and in community, and safety when swallowing. Factors affecting potential to achieve goals and functional outcome are cooperation/participation level; pt appears highly motivated with good family support. Patient will benefit from skilled SLP services to address above impairments and improve overall function.   REHAB POTENTIAL: Excellent   PLAN: SLP FREQUENCY: 2x/week   SLP DURATION: 12 weeks   PLANNED INTERVENTIONS: Aspiration precaution training, Pharyngeal strengthening exercises, Diet toleration management , Language facilitation, Environmental controls, Trials of upgraded texture/liquids, Cognitive reorganization, Internal/external aids, Functional tasks, Multimodal communication approach, SLP instruction and feedback, Compensatory strategies, and Patient/family education   [name], MS, CCC-SLP Speech-Language Pathologist [phone]   [name], [place] 01/25/2022, 11:00 AM  [facility] [address] [place], [place], [address] Phone: [phone]   Fax:  [phone]

## 2022-01-25 NOTE — Addendum Note (Signed)
Addended by: Benito Mccreedy E on: 01/25/2022 11:02 AM   Modules accepted: Orders

## 2022-01-25 NOTE — Therapy (Signed)
OUTPATIENT PHYSICAL THERAPY NEURO TREATMENT NOTE   Patient Name: Gerald Bauer MRN: [id number] DOB:1945-03-29, 76 y.o., male Today's Date: 01/25/2022   PCP: [name], PA-C REFERRING PROVIDER: Bary Leriche, PA-C   PT End of Session - 01/25/22 1022     Visit Number 13    Number of Visits 24    Date for PT Re-Evaluation 02/16/22    Progress Note Due on Visit 10    PT Start Time 1016    PT Stop Time 1100    PT Time Calculation (min) 44 min    Equipment Utilized During Treatment Gait belt    Activity Tolerance Patient tolerated treatment well    Behavior During Therapy WFL for tasks assessed/performed               Past Medical History:  Diagnosis Date   Acid reflux    Arthritis    hands   Carotid atherosclerosis    Diabetes mellitus without complication ([place])    Ganglion cyst 02/23/2015   Hyperlipemia    Hypertension    Joint pain in fingers of right hand 02/23/2015   Dominant hand   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 07/11/2015   Prostate cancer ([place])    history   Stroke (cerebrum) ([place]) 10/06/2021   Tachycardia    Tinea pedis of both feet 01/10/2017   Past Surgical History:  Procedure Laterality Date   [place]  2013   PROSTATECTOMY  2012   Patient Active Problem List   Diagnosis Date Noted   Hemiparesis, aphasia, and dysphagia as late effects of stroke ([place]) 11/07/2021   Lower urinary tract symptoms (LUTS) 11/07/2021   Myalgia due to statin 02/21/2021   Type 2 diabetes mellitus without complication, without long-term current use of insulin ([place]) 02/21/2021   Aortic atherosclerosis ([place]) 04/27/2020   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   Onychomycosis of multiple toenails with type 2 diabetes mellitus ([place]) 07/19/2016   Carotid stenosis 03/13/2016   Hyperlipidemia associated with type 2 diabetes mellitus ([place])    Hypertension associated with type 2 diabetes  mellitus ([place])    Personal history of prostate cancer    Carotid atherosclerosis    GERD (gastroesophageal reflux disease) 08/20/2013    ONSET DATE: 10/05/21  REFERRING DIAG: Z30.076 (ICD-10-CM) - Cerebral infarction due to unspecified occlusion or stenosis of left middle cerebral artery  THERAPY DIAG:  Difficulty in walking, not elsewhere classified  Muscle weakness (generalized)  Other lack of coordination  Unsteadiness on feet  Left middle cerebral artery stroke ([facility])  Abnormality of gait and mobility  Rationale for Evaluation and Treatment Rehabilitation  SUBJECTIVE:  SUBJECTIVE STATEMENT:  Pt reports 3/10 R shoulder pain as normal.  Pt reports he has no plans for this upcoming weekend.  Pt accompanied by: self and Brother, but he stayed in waiting area.  PAIN today:  Are you having pain? Yes: NPRS scale: 3/10 Pain location: bil shoulders, achey/dull, sharp  PERTINENT HISTORY:Per H and P from 7/17 hospital d/c Pt  is a 76 year old male with history of left-sided carotid stenosis, non-insulin-dependent diabetes mellitus type 2, PAD, GERD, mild COPD, history of tobacco use, hyperlipidemia, hypertension, who presents emergency department for chief concerns of facial droop, expressive aphasia, and R sided weakness  PRECAUTIONS: Fall  WEIGHT BEARING RESTRICTIONS No  FALLS: Has patient fallen in last 6 months? Yes. Number of falls 1  LIVING ENVIRONMENT: Lives with: lives with their family and lived alone prior to onset  Lives in: House/apartment Stairs: Yes: External: 4 steps; can reach both Has following equipment at home: Walker - 2 wheeled and shower chair  PLOF: Independent  PATIENT GOALS become independent with everyday activities like he was prior to the onset of the stroke    OBJECTIVE:   DIAGNOSTIC FINDINGS: 1. 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 No associated hemorrhage or significant regional mass effect.  2. Loss of normal flow voids throughout the left ICA and MCA,  consistent with slow flow and/or occlusion. Susceptibility artifact  within proximal left MCA branches consistent with intraluminal  thrombus.  3. Underlying mild chronic microvascular ischemic disease. Tiny  remote left ACA distribution infarct.     MRA HEAD IMPRESSION:   "1. Interval near complete occlusion of the left ICA and MCA,  presumably acute in nature given the presence of the acute left MCA  territory infarct. Left A1 segment not well seen either, also likely  occluded.  2. Otherwise wide patency of the major intracranial arterial  circulation. No other hemodynamically significant or correctable  stenosis.     MRA NECK IMPRESSION:  1. 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 Some attenuated flow seen  distally within the cervical left ICA, with subsequent reocclusion  by the skull base.  2. 50% atheromatous stenosis at the origin of the cervical right  ICA. Otherwise wide patency of the right carotid artery system.  3. Wide patency of the vertebral arteries within the neck.".    COGNITION: Overall cognitive status: Within functional limits for tasks assessed and subjective history limited secondary to aphasia but able to answer 1 word questions appropriately.    SENSATION: WFL  COORDINATION: WNL with heel to shin testing    LOWER EXTREMITY ROM:   AROM WNL    LOWER EXTREMITY MMT:    MMT Right Eval Left Eval  Hip flexion 4 4+  Hip extension    Hip abduction 4 5  Hip adduction 4 5  Hip internal rotation    Hip external rotation    Knee flexion 4 5  Knee extension 4 5  Ankle dorsiflexion 4+ 5  Ankle plantarflexion 4+ 5  Ankle inversion 5 5  Ankle eversion 5 5  (Blank rows = not  tested)   TRANSFERS: Assistive device utilized: Environmental consultant - 2 wheeled  Sit to stand: Modified independence Stand to sit: Complete Independence Chair to chair: SBA  STAIRS:  Level of Assistance: SBA Stair Negotiation Technique: Step to Pattern with Bilateral Rails Number of Stairs: 4  Height of Stairs: 6 in  Comments: Right  lower extremity foot catches when a sending steps and patient performs descending steps utilizing right lower extremity for more support.  Patient reports he knows he is doing this "the hard way"  GAIT: Gait pattern: decreased arm swing- Right and decreased step length- Right Distance walked: 10 M Assistive device utilized: Environmental consultant - 2 wheeled and None Level of assistance: Modified independence Comments: difficulty with turns and object navigation per DGI tesitng, able to walk without AD but only in controlled environment without obstacles or significant challenge  FUNCTIONAL TESTs:  5 times sit to stand: 16.95 sec no UE assist 10 meter walk test: .76 m/s without AD  Berg Balance Scale: 44/56 DGI:10  Progress Note 10/10: 5x STS: 13sec no UE support DGI:14/24 BERG: 50/56 10 meter walk test: .09 m/s without AD (normal speed)   PATIENT SURVEYS:  FOTO to retest next session, likely with either asking patient questions or having caregiver complete patient completed but had 97% indicating he could run and jump with these and when questioned about this following patient reports he cannot run or jump with these and would benefit from filling this out again next time.  12/04/2021: 49 (goal 62) 01/02/2022: 92 (goal 62) 01/23/2022: 56 (goal 62)    TODAY'S TREATMENT:   Gait belt donned, CGA provided unless noted otherwise  NMR: Seated BP prior to session 142/95 mmHg  HR 96 bpm  Seated BP following ambulation 131/94 mmHg HR 105 bpm  Ambulation within the the hospital on carpeted surfaces, tile, all flat surfaces, continuous walking for 20 minutes without  any necessary rest breaks   Ambulation up 2 flights of stairs/26 steps, to first floor then continued ambulation around the hospital to the next stairway in which he climbed 2 more flights of stairs/26, altogether 52 steps    TherEx:  Seated hamstring curls with BTB, 2x15 each LE Seated resisted hip abduction with BTB around distal thighs, 2x15 Seated resisted marches with BTB resistance applied to each forefoot, 2x15 each LE  FGA performed and added to goals as listed below: 18/30      PATIENT EDUCATION: Education details: Pt educated throughout session about proper posture and technique with exercises. Improved exercise technique, movement at target joints, use of target muscles after min to mod verbal, visual, tactile cues.   Person educated: Patient Education method: Explanation, Demonstration, Tactile cues, and Verbal cues Education comprehension: verbalized understanding, returned demonstration, verbal cues required, tactile cues required, and needs further education   HOME EXERCISE PROGRAM:  No updates today, pt to continue as previously given  9/11: Pt to continue HEP as previously given. However, PT did provide education to use a chair with arm-rests for lateral support when performing STS  10/3: added standing hip ABD/ext with UE support on counter 10/26: updated as below  Access Code: [email or web address] URL: [email or web address] Date: 01/18/2022 Prepared by: Burna Nation  Exercises - Prone Hip Extension  - 1 x daily - 7 x weekly - 3 sets - 10 reps - Standing Hip Abduction with Resistance at Ankles and Counter Support  - 1 x daily - 7 x weekly - 3 sets - 10 reps - Standing Hip Extension with Resistance at Ankles and Counter Support  - 1 x daily - 7 x weekly - 3 sets - 10 reps - Side Stepping with Resistance at Ankles and Counter Support  - 1 x daily - 7 x weekly - 3 sets - 10 reps - Forward and Backward Monster Walk with Resistance at Ankles and Counter  Support   - 1 x daily - 7 x weekly - 3 sets - 10 reps - Forward Monster Walk with Resistance at [employer] and Counter Support  - 1 x daily - 7 x weekly - 3 sets - 10 reps    GOALS: Goals reviewed with patient? Yes  SHORT TERM GOALS: Target date: 12/22/2021   Patient will be independent in home exercise program to improve strength/mobility for better functional independence with ADLs. Baseline: No HEP currently, (10/10) not compliant Goal status: INITIAL   LONG TERM GOALS: Target date: 02/16/2022  1.  Patient (> 56 years old) will complete five times sit to stand test in < 15 seconds indicating an increased LE strength and improved balance. Baseline: 16.95 01/02/22: 13 sec Goal status: MET  2.  Patient will increase FOTO score to equal to or greater than  60   to demonstrate statistically significant improvement in mobility and quality of life.  Baseline: Test next session 01/02/22: 92 Goal status: MET   3.  Patient will increase Berg Balance score by > 6 points to demonstrate decreased fall risk during functional activities. Baseline: 44 01/02/22: 50/56 Goal status: MET   4.  Patient will improve DGI by 5 points or more to reduce fall risk and demonstrate improved gait ability. Baseline: 10 01/02/22: 14 Goal status: PROGRESSING  5.  Patient will increase 10 meter walk test to >1.32ms as to improve gait speed for better community ambulation and to reduce fall risk. Baseline: .754m 01/02/22: 0.9 m/s Goal status: PROGRESSING  6.  Patient will increase Berg Balance score by > 6 points to demonstrate decreased fall risk during functional activities. Baseline: 44 01/02/22: 50/56 Goal status: PROGRESSING  7. Patient will increase FGA score by > 4 points to demonstrate decreased fall risk during functional activities. Baseline: 18/30 Goal status: NEW   ASSESSMENT:  CLINICAL IMPRESSION:   Pt performed well with exercises and noted to be doing better with stairs today during  training.  Pt notes that he is feeling tired at the conclusion of the session due to the amount of ambulation and stairs that he performed.  Pt is making significant progress towards goals and will continue to benefit from skilled therapy to address the remaining deficits that are still present.     OBJECTIVE IMPAIRMENTS Abnormal gait, cardiopulmonary status limiting activity, decreased activity tolerance, decreased balance, decreased endurance, decreased mobility, difficulty walking, decreased strength, and decreased safety awareness.   ACTIVITY LIMITATIONS lifting, bending, standing, squatting, stairs, transfers, and locomotion level  PARTICIPATION LIMITATIONS: meal prep, shopping, and community activity  PERSONAL FACTORS Age and 3+ comorbidities: left-sided carotid stenosis, non-insulin-dependent diabetes mellitus type 2, PAD, GERD, mild COPD, history of tobacco use, hyperlipidemia, hypertension  are also affecting patient's functional outcome.   REHAB POTENTIAL: Good  CLINICAL DECISION MAKING: Evolving/moderate complexity  EVALUATION COMPLEXITY: Moderate   PLAN:  PT FREQUENCY: 2x/week  PT DURATION: 12 weeks  PLANNED INTERVENTIONS: Therapeutic exercises, Therapeutic activity, Neuromuscular re-education, Balance training, Gait training, Patient/Family education, Self Care, Joint mobilization, Stair training, Moist heat, and Manual therapy  PLAN FOR NEXT SESSION:   Continue with functional/dynamic balance interventions   JoGwenlyn SaranPT, DPT Physical Therapist- [facility]11/02/23, 3:12 PM

## 2022-01-29 NOTE — Therapy (Signed)
OUTPATIENT PHYSICAL THERAPY NEURO TREATMENT NOTE   Patient Name: Gerald Bauer MRN: [id number] DOB:Sep 08, 1945, 76 y.o., male Today's Date: 01/30/2022   PCP: [name], PA-C REFERRING PROVIDER: Bary Leriche, PA-C   PT End of Session - 01/30/22 1004     Visit Number 14    Number of Visits 24    Date for PT Re-Evaluation 02/16/22    Progress Note Due on Visit 10    PT Start Time 1002    PT Stop Time 1046    PT Time Calculation (min) 44 min    Equipment Utilized During Treatment Gait belt    Activity Tolerance Patient tolerated treatment well    Behavior During Therapy WFL for tasks assessed/performed                Past Medical History:  Diagnosis Date   Acid reflux    Arthritis    hands   Carotid atherosclerosis    Diabetes mellitus without complication ([place])    Ganglion cyst 02/23/2015   Hyperlipemia    Hypertension    Joint pain in fingers of right hand 02/23/2015   Dominant hand   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 07/11/2015   Prostate cancer ([place])    history   Stroke (cerebrum) ([place]) 10/06/2021   Tachycardia    Tinea pedis of both feet 01/10/2017   Past Surgical History:  Procedure Laterality Date   [place]  2013   PROSTATECTOMY  2012   Patient Active Problem List   Diagnosis Date Noted   Hemiparesis, aphasia, and dysphagia as late effects of stroke ([place]) 11/07/2021   Lower urinary tract symptoms (LUTS) 11/07/2021   Myalgia due to statin 02/21/2021   Type 2 diabetes mellitus without complication, without long-term current use of insulin ([place]) 02/21/2021   Aortic atherosclerosis ([place]) 04/27/2020   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   Onychomycosis of multiple toenails with type 2 diabetes mellitus ([place]) 07/19/2016   Carotid stenosis 03/13/2016   Hyperlipidemia associated with type 2 diabetes mellitus ([place])    Hypertension associated with type 2 diabetes  mellitus ([place])    Personal history of prostate cancer    Carotid atherosclerosis    GERD (gastroesophageal reflux disease) 08/20/2013    ONSET DATE: 10/05/21  REFERRING DIAG: L07.867 (ICD-10-CM) - Cerebral infarction due to unspecified occlusion or stenosis of left middle cerebral artery  THERAPY DIAG:  Difficulty in walking, not elsewhere classified  Muscle weakness (generalized)  Other lack of coordination  Abnormality of gait and mobility  Unsteadiness on feet  Rationale for Evaluation and Treatment Rehabilitation  SUBJECTIVE:  SUBJECTIVE STATEMENT:  Pt reports a 9/10 pain on the shoulder.  He states that he slept on the R shoulder all night long.   Pt accompanied by: self and daughter  PAIN today:  Are you having pain? Yes: NPRS scale: 3/10 Pain location: bil shoulders, achey/dull, sharp  PERTINENT HISTORY:Per H and P from 7/17 hospital d/c Pt  is a 76 year old male with history of left-sided carotid stenosis, non-insulin-dependent diabetes mellitus type 2, PAD, GERD, mild COPD, history of tobacco use, hyperlipidemia, hypertension, who presents emergency department for chief concerns of facial droop, expressive aphasia, and R sided weakness  PRECAUTIONS: Fall  WEIGHT BEARING RESTRICTIONS No  FALLS: Has patient fallen in last 6 months? Yes. Number of falls 1  LIVING ENVIRONMENT:  Lives with: lives with their family and lived alone prior to onset  Lives in: House/apartment Stairs: Yes: External: 4 steps; can reach both Has following equipment at home: Walker - 2 wheeled and shower chair  PLOF: Independent  PATIENT GOALS become independent with everyday activities like he was prior to the onset of the stroke   OBJECTIVE:   DIAGNOSTIC FINDINGS: 1. Moderate sized acute ischemic  left MCA distribution infarct as  above. No associated hemorrhage or significant regional mass effect.  2. Loss of normal flow voids throughout the left ICA and MCA,  consistent with slow flow and/or occlusion. Susceptibility artifact  within proximal left MCA branches consistent with intraluminal  thrombus.  3. Underlying mild chronic microvascular ischemic disease. Tiny  remote left ACA distribution infarct.     MRA HEAD IMPRESSION:   "1. Interval near complete occlusion of the left ICA and MCA,  presumably acute in nature given the presence of the acute left MCA  territory infarct. Left A1 segment not well seen either, also likely  occluded.  2. Otherwise wide patency of the major intracranial arterial  circulation. No other hemodynamically significant or correctable  stenosis.     MRA NECK IMPRESSION:  1. 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 Some attenuated flow seen  distally within the cervical left ICA, with subsequent reocclusion  by the skull base.  2. 50% atheromatous stenosis at the origin of the cervical right  ICA. Otherwise wide patency of the right carotid artery system.  3. Wide patency of the vertebral arteries within the neck.".    COGNITION: Overall cognitive status: Within functional limits for tasks assessed and subjective history limited secondary to aphasia but able to answer 1 word questions appropriately.    SENSATION: WFL  COORDINATION: WNL with heel to shin testing    LOWER EXTREMITY ROM:   AROM WNL    LOWER EXTREMITY MMT:    MMT Right Eval Left Eval  Hip flexion 4 4+  Hip extension    Hip abduction 4 5  Hip adduction 4 5  Hip internal rotation    Hip external rotation    Knee flexion 4 5  Knee extension 4 5  Ankle dorsiflexion 4+ 5  Ankle plantarflexion 4+ 5  Ankle inversion 5 5  Ankle eversion 5 5  (Blank rows = not tested)   TRANSFERS: Assistive device utilized: Environmental consultant - 2 wheeled   Sit to stand: Modified independence Stand to sit: Complete Independence Chair to chair: SBA  STAIRS:  Level of Assistance: SBA Stair Negotiation Technique: Step to Pattern with Bilateral Rails Number of Stairs: 4  Height of Stairs: 6 in  Comments: Right lower extremity  foot catches when a sending steps and patient performs descending steps utilizing right lower extremity for more support.  Patient reports he knows he is doing this "the hard way"  GAIT: Gait pattern: decreased arm swing- Right and decreased step length- Right Distance walked: 10 M Assistive device utilized: Environmental consultant - 2 wheeled and None Level of assistance: Modified independence Comments: difficulty with turns and object navigation per DGI tesitng, able to walk without AD but only in controlled environment without obstacles or significant challenge  FUNCTIONAL TESTs:  5 times sit to stand: 16.95 sec no UE assist 10 meter walk test: .76 m/s without AD  Berg Balance Scale: 44/56 DGI:10  Progress Note 10/10: 5x STS: 13sec no UE support DGI:14/24 BERG: 50/56 10 meter walk test: .09 m/s without AD (normal speed)   PATIENT SURVEYS:  FOTO to retest next session, likely with either asking patient questions or having caregiver complete patient completed but had 97% indicating he could run and jump with these and when questioned about this following patient reports he cannot run or jump with these and would benefit from filling this out again next time.  12/04/2021: 49 (goal 62) 01/02/2022: 92 (goal 62) 01/23/2022: 56 (goal 62)    TODAY'S TREATMENT:  Gait belt donned, CGA provided unless noted otherwise  NMR: Seated BP prior to session 142/105 mmHg  HR 104 bpm  Re-taken on the R side 147/97 mmHg  Seated BP following resisted ambulation 142/109 mmHg HR 128 bpm  Seated BP after rest break 130/100 HR 122  Resisted ambulation across 4" step and red mat on floor for unstable surface, x5 forward, x3 lateral  to pt's L.  Pt fatiguing so was exercise was ceased and BP checked.   TherEx:  Seated hamstring curls with BTB, 2x15 each LE Seated resisted hip abduction with BTB around distal thighs, 2x15 Seated resisted marches with BTB resistance applied to each forefoot, 2x15 each LE Seated resisted LAQ/Leg extension, 17.5#, 2x15      PATIENT EDUCATION: Education details: Pt educated throughout session about proper posture and technique with exercises. Improved exercise technique, movement at target joints, use of target muscles after min to mod verbal, visual, tactile cues.   Person educated: Patient Education method: Explanation, Demonstration, Tactile cues, and Verbal cues Education comprehension: verbalized understanding, returned demonstration, verbal cues required, tactile cues required, and needs further education   HOME EXERCISE PROGRAM:  No updates today, pt to continue as previously given  9/11: Pt to continue HEP as previously given. However, PT did provide education to use a chair with arm-rests for lateral support when performing STS  10/3: added standing hip ABD/ext with UE support on counter 10/26: updated as below  Access Code: [email or web address] URL: [email or web address] Date: 01/18/2022 Prepared by: Harrold Nation  Exercises - Prone Hip Extension  - 1 x daily - 7 x weekly - 3 sets - 10 reps - Standing Hip Abduction with Resistance at Ankles and Counter Support  - 1 x daily - 7 x weekly - 3 sets - 10 reps - Standing Hip Extension with Resistance at Ankles and Counter Support  - 1 x daily - 7 x weekly - 3 sets - 10 reps - Side Stepping with Resistance at Ankles and Counter Support  - 1 x daily - 7 x weekly - 3 sets - 10 reps - Forward and Backward Monster Walk with Resistance at Ankles and Counter Support  - 1 x daily - 7 x weekly - 3 sets - 10 reps -  Forward Monster Walk with Resistance at [employer] and [employer]  - 1 x daily - 7 x weekly - 3 sets - 10  reps    GOALS: Goals reviewed with patient? Yes  SHORT TERM GOALS: Target date: 12/22/2021   Patient will be independent in home exercise program to improve strength/mobility for better functional independence with ADLs. Baseline: No HEP currently, (10/10) not compliantGoal status: INITIAL   LONG TERM GOALS: Target date: 02/16/2022  1.  Patient (> 19 years old) will complete five times sit to stand test in < 15 seconds indicating an increased LE strength and improved balance. Baseline: 16.95 01/02/22: 13 sec Goal status: MET  2.  Patient will increase FOTO score to equal to or greater than  60   to demonstrate statistically significant improvement in mobility and quality of life.  Baseline: Test next session 01/02/22: 92 Goal status: MET   3.  Patient will increase Berg Balance score by > 6 points to demonstrate decreased fall risk during functional activities. Baseline: 44 01/02/22: 50/56 Goal status: MET   4.  Patient will improve DGI by 5 points or more to reduce fall risk and demonstrate improved gait ability. Baseline: 10 01/02/22: 14 Goal status: PROGRESSING  5.  Patient will increase 10 meter walk test to >1.12ms as to improve gait speed for better community ambulation and to reduce fall risk. Baseline: .752m 01/02/22: 0.9 m/s Goal status: PROGRESSING  6.  Patient will increase Berg Balance score by > 6 points to demonstrate decreased fall risk during functional activities. Baseline: 44 01/02/22: 50/56 Goal status: PROGRESSING  7. Patient will increase FGA score by > 4 points to demonstrate decreased fall risk during functional activities. Baseline: 18/30 Goal status: NEW   ASSESSMENT:  CLINICAL IMPRESSION:   Pt continues to perform well with tasks and puts forth great effort throughout the session.  Pt noted to have elevated BP that was documented as above.  OT made aware and pt and DIL educated on informing MD at next appointment.  Wll continue to  monitor throughout future sessions.   Pt will continue to benefit from skilled therapy to address remaining deficits in order to improve overall QoL and return to PLOF.      OBJECTIVE IMPAIRMENTS Abnormal gait, cardiopulmonary status limiting activity, decreased activity tolerance, decreased balance, decreased endurance, decreased mobility, difficulty walking, decreased strength, and decreased safety awareness.   ACTIVITY LIMITATIONS lifting, bending, standing, squatting, stairs, transfers, and locomotion level  PARTICIPATION LIMITATIONS: meal prep, shopping, and community activity  PERSONAL FACTORS Age and 3+ comorbidities: left-sided carotid stenosis, non-insulin-dependent diabetes mellitus type 2, PAD, GERD, mild COPD, history of tobacco use, hyperlipidemia, hypertension  are also affecting patient's functional outcome.   REHAB POTENTIAL: Good  CLINICAL DECISION MAKING: Evolving/moderate complexity  EVALUATION COMPLEXITY: Moderate   PLAN:  PT FREQUENCY: 2x/week  PT DURATION: 12 weeks  PLANNED INTERVENTIONS: Therapeutic exercises, Therapeutic activity, Neuromuscular re-education, Balance training, Gait training, Patient/Family education, Self Care, Joint mobilization, Stair training, Moist heat, and Manual therapy  PLAN FOR NEXT SESSION:   Continue with functional/dynamic balance interventions   JoGwenlyn SaranPT, DPT Physical Therapist- [facility]11/07/23, 10:57 AM

## 2022-01-30 ENCOUNTER — Ambulatory Visit: Payer: Medicare Other

## 2022-01-30 ENCOUNTER — Ambulatory Visit: Payer: Medicare Other | Admitting: Occupational Therapy

## 2022-01-30 ENCOUNTER — Ambulatory Visit: Payer: Medicare Other | Admitting: Speech Pathology

## 2022-01-30 DIAGNOSIS — R4701 Aphasia: Secondary | ICD-10-CM

## 2022-01-30 DIAGNOSIS — R269 Unspecified abnormalities of gait and mobility: Secondary | ICD-10-CM

## 2022-01-30 DIAGNOSIS — R262 Difficulty in walking, not elsewhere classified: Secondary | ICD-10-CM

## 2022-01-30 DIAGNOSIS — R482 Apraxia: Secondary | ICD-10-CM | POA: Diagnosis not present

## 2022-01-30 DIAGNOSIS — R278 Other lack of coordination: Secondary | ICD-10-CM

## 2022-01-30 DIAGNOSIS — R2681 Unsteadiness on feet: Secondary | ICD-10-CM

## 2022-01-30 DIAGNOSIS — M6281 Muscle weakness (generalized): Secondary | ICD-10-CM

## 2022-01-30 NOTE — Therapy (Signed)
OUTPATIENT SPEECH LANGUAGE PATHOLOGY TREATMENT NOTE   Patient Name: Gerald Bauer MRN: [id number] DOB:Jul 04, 1945, 76 y.o., male Today's Date: 01/30/2022  PCP: [name], PA-C REFERRING PROVIDER: Reesa Chew, PA-C   END OF SESSION:   End of Session - 01/30/22 1423     Visit Number 21    Number of Visits 48    Date for SLP Re-Evaluation 04/25/22    SLP Start Time 0900    SLP Stop Time  1000    SLP Time Calculation (min) 60 min    Activity Tolerance Patient tolerated treatment well             Past Medical History:  Diagnosis Date   Acid reflux    Arthritis    hands   Carotid atherosclerosis    Diabetes mellitus without complication ([place])    Ganglion cyst 02/23/2015   Hyperlipemia    Hypertension    Joint pain in fingers of right hand 02/23/2015   Dominant hand   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 07/11/2015   Prostate cancer ([place])    history   Stroke (cerebrum) ([place]) 10/06/2021   Tachycardia    Tinea pedis of both feet 01/10/2017   Past Surgical History:  Procedure Laterality Date   [place]  2013   PROSTATECTOMY  2012   Patient Active Problem List   Diagnosis Date Noted   Hemiparesis, aphasia, and dysphagia as late effects of stroke ([place]) 11/07/2021   Lower urinary tract symptoms (LUTS) 11/07/2021   Myalgia due to statin 02/21/2021   Type 2 diabetes mellitus without complication, without long-term current use of insulin ([place]) 02/21/2021   Aortic atherosclerosis ([place]) 04/27/2020   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   Onychomycosis of multiple toenails with type 2 diabetes mellitus ([place]) 07/19/2016   Carotid stenosis 03/13/2016   Hyperlipidemia associated with type 2 diabetes mellitus ([place])    Hypertension associated with type 2 diabetes mellitus ([place])    Personal history of prostate cancer    Carotid atherosclerosis    GERD (gastroesophageal reflux disease)  08/20/2013    ONSET DATE: 10/05/2021    REFERRING DIAG: Cerebral infarction due to unspecified occlusion of left middle cerebral artery  THERAPY DIAG:  Verbal apraxia  Aphasia Verbal apraxia  Rationale for Evaluation and Treatment Rehabilitation  SUBJECTIVE STATEMENT:             "Fine, thank you."   Pt accompanied by: daughter in law [name]  PERTINENT HISTORY: Pt  is a 76 year old male with history of left-sided carotid stenosis, non-insulin-dependent diabetes mellitus type 2, PAD, GERD, mild COPD, history of tobacco use, hyperlipidemia, hypertension, who presented 10/05/21 to [facility] ED with facial droop, expressive aphasia, and R sided weakness and found to have left MCA CVA. He was admitted to [facility] 7/17-10/31/21. Advanced to dysphagia 3/thin liquids by cup by time of d/c from CIR. 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 Difficult to fully assess cognition given severity of linguistic impairment; however, does present with decreased safety awareness, diminished problem-solving, emergent awareness, and mild impulsivity with movement."     DIAGNOSTIC FINDINGS: MBS 10/19/21: "Pt presents with overall mild oropharyngeal dysphagia. Oral phase c/b piecemeal deglutition, decreased mastication, decreased bolus cohesion, premature spillage, and weak lingual manipulation. Pharyngeal phase c/b reduced pharyngeal peristalsis + reduced BOT approximation to PPW, which results in mild  to moderate vallecular and pyriform sinuses residuals (vallecula > pyriform sinuses for solids, pyriform sinuses > vallecula for liquids). No penetration nor aspiration appreciated with thin liquid via cup, Dysphagia 1 (puree), Dysphagia 2 (chopped), Dysphagia 3 (mechanical soft), or with 13 mm Barium tablet placed in puree. Once instance of sensed aspiration with intake of large bolus of thin liquid via straw due to swallow  initiation delay to pyriform sinuses and mistiming of swallow-breath cycle; despite strong reflexive cough aspirant was not fully cleared from trachea. Pt's level of swallow initiation was noted to vary with liquid consumption (over base of epiglottis and at pyriform sinuses) with level of swallow initiation for solids to be consistent at the vallecula. Pharyngeal stasis was partially cleared with reflexive secondary swallow." MRI brain 10/05/21: 1. 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 No associated hemorrhage or significant regional mass effect. 2. Loss of normal flow voids throughout the left ICA and MCA, consistent with slow flow and/or occlusion. 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 3. Underlying mild chronic microvascular ischemic disease. Tiny remote left ACA distribution infarct.  PAIN:  Are you having pain? No  PATIENT GOALS talk better   OBJECTIVE:    TODAY'S TREATMENT:  Targeted simple-mod complex conversation re: pt's weekend activities; he shared details about a visit with his family with occasional min-mod cues for slower rate and to repair breakdowns. Targeted s blends; intelligibility on first attempt 30%, improves to 100% with 1-2 additional attempts. Pt required cues at first to initiate repair consistently, however with repetition began initiating repair >80% of the time when necessary. Pt generated sentences with /sp/ blend words, intelligibility 65%.   PATIENT EDUCATION: Education details: see today's treatment for details Person educated: Patient Education method: Explanation, Demonstration, and written supports Education comprehension: verbalized understanding and needs further education     GOALS: Goals reviewed with patient? Yes   SHORT TERM GOALS: Target date: 10 sessions   Patient will participate in clinical assessment of swallow function with goals added as  needed. Baseline: Goal status: MET   2.  Pt will communicate emergency information 100% accuracy using visual aid if necessary.  Baseline:  Goal status: MET   3.  Pt will approximate personally relevant words and phrases >80% accuracy using script training and min visual cues for apraxia.   Baseline:  Goal status: MET   4.  Pt will generate at least 4 descriptors of target word 80% of the time using semantic feature analysis to improve abilities in wordfinding and resolving communication breakdowns.  Baseline:  Goal status: MET   5.  Pt will complete HEP for dysphagia with rare min A. Baseline:  Goal status: MET   6.  Pt will generate sentences using personally relevant words list for >80% intelligibility. Baseline: 50% intelligibility Goal status: MET   7.  Pt will improve expressive language skills by taking 4-6 turns in simple conversation, with supported conversation strategies if needed. Baseline:  Goal status: MET 8.  Pt will generate sentences moderately complex sentences 8-10 words >80% intelligibility, with self-correction/multimodal communication allowed. Baseline: 50% intelligibility Goal status: INITIAL   9.  Patient will use intelligibility strategies and script training for 4-6 turn telephone exchange with family member or friend. Baseline:  Goal status: INITIAL     LONG TERM GOALS: Target date: 04/25/2022    Pt will engage in 5-8 minutes simple-mod complex conversation re: topic of interest with supported conversation, aphasia compensations.  Baseline: 01/25/22:  5 turn exchange. Goal renewed 01/25/22 Goal status: IN PROGRESS   2.  Pt will ID and attempt repair of communication breakdowns >90% of the time in session.    Baseline: 01/25/22: requires occasional mod cues to initiate correction; renewed 01/25/22 Goal status: IN PROGRESS   3.  Pt/family will demonstrate knowledge of community resources and activities to support language/communication. Baseline:  01/25/22: referral completed to [facility]; pt has yet to register for group, renewed 01/25/22 Goal status: IN PROGRESS   4.  Pt will demonstrate use of swallowing precautions independently with s/sx aspiration <5% of trials. Baseline: 01/25/22: no overt s/sx when sipping water in sessions, pt and family report no issues at home with meals Goal status: DEFERRED      ASSESSMENT:   CLINICAL IMPRESSION: Patient presents with moderate Broca's aphasia, moderate verbal apraxia, and mild dysarthria. With encouragement, extended time and mod cues, pt able to repair of breakdowns using multimodal means. Pt now initiating use of strategies to slow rate and improve accuracy of verbal output. Continues to improve error awareness and attempts at self-correction. Patient remains limited in his ability to relay thoughts, feelings, and non-contextual or abstract information. Pt remains motivated and is demonstrating excellent progress toward goals. I recommend skilled ST to improve pt's language and motor speech skills to improve communication and reduce frustration.    OBJECTIVE IMPAIRMENTS include expressive language, receptive language, aphasia, apraxia, dysarthria, and dysphagia. These impairments are limiting patient from managing medications, managing appointments, managing finances, household responsibilities, ADLs/IADLs, effectively communicating at home and in community, and safety when swallowing. Factors affecting potential to achieve goals and functional outcome are cooperation/participation level; pt appears highly motivated with good family support. Patient will benefit from skilled SLP services to address above impairments and improve overall function.   REHAB POTENTIAL: Excellent   PLAN: SLP FREQUENCY: 2x/week   SLP DURATION: 12 weeks   PLANNED INTERVENTIONS: Aspiration precaution training, Pharyngeal strengthening exercises, Diet toleration management , Language facilitation, Environmental controls,  Trials of upgraded texture/liquids, Cognitive reorganization, Internal/external aids, Functional tasks, Multimodal communication approach, SLP instruction and feedback, Compensatory strategies, and Patient/family education   [name], MS, CCC-SLP Speech-Language Pathologist [phone]   [name], [place] 01/30/2022, 2:28 PM  [facility] [address] [place], [place], [address] Phone: [phone]   Fax:  [phone]

## 2022-02-01 ENCOUNTER — Ambulatory Visit: Payer: Medicare Other

## 2022-02-01 ENCOUNTER — Other Ambulatory Visit: Payer: Self-pay | Admitting: Cardiovascular Disease

## 2022-02-01 ENCOUNTER — Ambulatory Visit: Payer: Medicare Other | Admitting: Speech Pathology

## 2022-02-01 ENCOUNTER — Encounter: Payer: Self-pay | Admitting: Occupational Therapy

## 2022-02-01 DIAGNOSIS — R4701 Aphasia: Secondary | ICD-10-CM

## 2022-02-01 DIAGNOSIS — R482 Apraxia: Secondary | ICD-10-CM | POA: Diagnosis not present

## 2022-02-01 DIAGNOSIS — R269 Unspecified abnormalities of gait and mobility: Secondary | ICD-10-CM

## 2022-02-01 DIAGNOSIS — M6281 Muscle weakness (generalized): Secondary | ICD-10-CM

## 2022-02-01 DIAGNOSIS — R2681 Unsteadiness on feet: Secondary | ICD-10-CM

## 2022-02-01 DIAGNOSIS — R262 Difficulty in walking, not elsewhere classified: Secondary | ICD-10-CM

## 2022-02-01 DIAGNOSIS — R278 Other lack of coordination: Secondary | ICD-10-CM

## 2022-02-01 DIAGNOSIS — I63512 Cerebral infarction due to unspecified occlusion or stenosis of left middle cerebral artery: Secondary | ICD-10-CM

## 2022-02-01 NOTE — Addendum Note (Signed)
Addended by: Garlon Hatchet T on: 02/01/2022 08:04 PM   Modules accepted: Orders

## 2022-02-01 NOTE — Therapy (Signed)
OUTPATIENT PHYSICAL THERAPY NEURO TREATMENT NOTE   Patient Name: Gerald Bauer MRN: [id number] DOB:Apr 14, 1945, 76 y.o., male Today's Date: 02/01/2022   PCP: [name], PA-C REFERRING PROVIDER: Bary Leriche, PA-C   PT End of Session - 02/01/22 1016     Visit Number 15    Number of Visits 24    Date for PT Re-Evaluation 02/16/22    Progress Note Due on Visit 10    PT Start Time 1015    PT Stop Time 1100    PT Time Calculation (min) 45 min    Equipment Utilized During Treatment Gait belt    Activity Tolerance Patient tolerated treatment well    Behavior During Therapy WFL for tasks assessed/performed                 Past Medical History:  Diagnosis Date   Acid reflux    Arthritis    hands   Carotid atherosclerosis    Diabetes mellitus without complication ([place])    Ganglion cyst 02/23/2015   Hyperlipemia    Hypertension    Joint pain in fingers of right hand 02/23/2015   Dominant hand   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 07/11/2015   Prostate cancer ([place])    history   Stroke (cerebrum) ([place]) 10/06/2021   Tachycardia    Tinea pedis of both feet 01/10/2017   Past Surgical History:  Procedure Laterality Date   [place]  2013   PROSTATECTOMY  2012   Patient Active Problem List   Diagnosis Date Noted   Hemiparesis, aphasia, and dysphagia as late effects of stroke ([place]) 11/07/2021   Lower urinary tract symptoms (LUTS) 11/07/2021   Myalgia due to statin 02/21/2021   Type 2 diabetes mellitus without complication, without long-term current use of insulin ([place]) 02/21/2021   Aortic atherosclerosis (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   Onychomycosis of multiple toenails with type 2 diabetes mellitus ([place]) 07/19/2016   Carotid stenosis 03/13/2016   Hyperlipidemia associated with type 2 diabetes mellitus ([place])    Hypertension associated with type 2 diabetes  mellitus ([place])    Personal history of prostate cancer    Carotid atherosclerosis    GERD (gastroesophageal reflux disease) 08/20/2013    ONSET DATE: 10/05/21  REFERRING DIAG: R51.884 (ICD-10-CM) - Cerebral infarction due to unspecified occlusion or stenosis of left middle cerebral artery  THERAPY DIAG:  Difficulty in walking, not elsewhere classified  Muscle weakness (generalized)  Abnormality of gait and mobility  Unsteadiness on feet  Rationale for Evaluation and Treatment Rehabilitation  SUBJECTIVE:  SUBJECTIVE STATEMENT:  Pt reports 3/10 pain upon arrival to the clinic.  [name] reports that he was down to 0/10 after working Amy last visit.     Pt accompanied by: self and daughter  PAIN today:  Are you having pain? Yes: NPRS scale: 3/10 Pain location: bil shoulders, achey/dull, sharp  PERTINENT HISTORY:Per H and P from 7/17 hospital d/c Pt  is a 76 year old male with history of left-sided carotid stenosis, non-insulin-dependent diabetes mellitus type 2, PAD, GERD, mild COPD, history of tobacco use, hyperlipidemia, hypertension, who presents emergency department for chief concerns of facial droop, expressive aphasia, and R sided weakness  PRECAUTIONS: Fall  WEIGHT BEARING RESTRICTIONS No  FALLS: Has patient fallen in last 6 months? Yes. Number of falls 1  LIVING ENVIRONMENT:  Lives with: lives with their family and lived alone prior to onset  Lives in: House/apartment Stairs: Yes: External: 4 steps; can reach both Has following equipment at home: Walker - 2 wheeled and shower chair  PLOF: Independent  PATIENT GOALS become independent with everyday activities like he was prior to the onset of the stroke   OBJECTIVE:   DIAGNOSTIC FINDINGS: 1. Moderate sized acute ischemic left MCA  distribution infarct as  above. No associated hemorrhage or significant regional mass effect.  2. Loss of normal flow voids throughout the left ICA and MCA,  consistent with slow flow and/or occlusion. Susceptibility artifact  within proximal left MCA branches consistent with intraluminal  thrombus.  3. Underlying mild chronic microvascular ischemic disease. Tiny  remote left ACA distribution infarct.     MRA HEAD IMPRESSION:   "1. Interval near complete occlusion of the left ICA and MCA,  presumably acute in nature given the presence of the acute left MCA  territory infarct. Left A1 segment not well seen either, also likely  occluded.  2. Otherwise wide patency of the major intracranial arterial  circulation. No other hemodynamically significant or correctable  stenosis.     MRA NECK IMPRESSION:  1. 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 Some attenuated flow seen  distally within the cervical left ICA, with subsequent reocclusion  by the skull base.  2. 50% atheromatous stenosis at the origin of the cervical right  ICA. Otherwise wide patency of the right carotid artery system.  3. Wide patency of the vertebral arteries within the neck.".    COGNITION: Overall cognitive status: Within functional limits for tasks assessed and subjective history limited secondary to aphasia but able to answer 1 word questions appropriately.    SENSATION: WFL  COORDINATION: WNL with heel to shin testing    LOWER EXTREMITY ROM:   AROM WNL    LOWER EXTREMITY MMT:    MMT Right Eval Left Eval  Hip flexion 4 4+  Hip extension    Hip abduction 4 5  Hip adduction 4 5  Hip internal rotation    Hip external rotation    Knee flexion 4 5  Knee extension 4 5  Ankle dorsiflexion 4+ 5  Ankle plantarflexion 4+ 5  Ankle inversion 5 5  Ankle eversion 5 5  (Blank rows = not tested)   TRANSFERS: Assistive device utilized: Environmental consultant - 2 wheeled  Sit to  stand: Modified independence Stand to sit: Complete Independence Chair to chair: SBA  STAIRS:  Level of Assistance: SBA Stair Negotiation Technique: Step to Pattern with Bilateral Rails Number of Stairs: 4  Height of Stairs: 6 in  Comments: Right lower extremity foot catches when a sending steps and patient performs descending steps utilizing right lower extremity for more support.  Patient reports he knows he is doing this "the hard way"  GAIT: Gait pattern: decreased arm swing- Right and decreased step length- Right Distance walked: 10 M Assistive device utilized: Environmental consultant - 2 wheeled and None Level of assistance: Modified independence Comments: difficulty with turns and object navigation per DGI tesitng, able to walk without AD but only in controlled environment without obstacles or significant challenge  FUNCTIONAL TESTs:  5 times sit to stand: 16.95 sec no UE assist 10 meter walk test: .76 m/s without AD  Berg Balance Scale: 44/56 DGI:10  Progress Note 10/10: 5x STS: 13sec no UE support DGI:14/24 BERG: 50/56 10 meter walk test: .09 m/s without AD (normal speed)   PATIENT SURVEYS:  FOTO to retest next session, likely with either asking patient questions or having caregiver complete patient completed but had 97% indicating he could run and jump with these and when questioned about this following patient reports he cannot run or jump with these and would benefit from filling this out again next time.  12/04/2021: 49 (goal 62) 01/02/2022: 92 (goal 62) 01/23/2022: 56 (goal 62)    TODAY'S TREATMENT:  Gait belt donned, CGA provided unless noted otherwise  NMR: Seated BP prior to session 137/69 mmHg  HR 100 bpm  Seated BP following obstacle course 128/96 mmHg HR 111 bpm  Obstacle course with ambulation over canes, 360 deg in hula hoop, weaving in/out of cones, stepping on hedgehogs, stepping up and down onto treadmill, walking narrow plank.  Korebalance Tux Racer for  3 races, +3 stability level Korebalance Neverball for 3 levels, +3 stability level    TherEx:  Seated LAQ/knee extension at cable column machine, 12.5#, 2x15 each LE Seated hamstring curls at cable column machine, 12.5#, 2x15 each LE      PATIENT EDUCATION: Education details: Pt educated throughout session about proper posture and technique with exercises. Improved exercise technique, movement at target joints, use of target muscles after min to mod verbal, visual, tactile cues.   Person educated: Patient Education method: Explanation, Demonstration, Tactile cues, and Verbal cues Education comprehension: verbalized understanding, returned demonstration, verbal cues required, tactile cues required, and needs further education   HOME EXERCISE PROGRAM:  No updates today, pt to continue as previously given  9/11: Pt to continue HEP as previously given. However, PT did provide education to use a chair with arm-rests for lateral support when performing STS  10/3: added standing hip ABD/ext with UE support on counter 10/26: updated as below  Access Code: [email or web address] URL: [email or web address] Date: 01/18/2022 Prepared by: [place]  Exercises - Prone Hip Extension  - 1 x daily - 7 x weekly - 3 sets - 10 reps - Standing Hip Abduction with Resistance at Ankles and Counter Support  - 1 x daily - 7 x weekly - 3 sets - 10 reps - Standing Hip Extension with Resistance at Ankles and Counter Support  - 1 x daily - 7 x weekly - 3 sets - 10 reps - Side Stepping with Resistance at Ankles and Counter Support  - 1 x daily - 7 x weekly - 3 sets - 10 reps - Forward and Backward Monster Walk with Resistance at Ankles and Counter Support  - 1 x daily - 7 x weekly - 3 sets - 10 reps - [employer] Walk with Resistance at [employer] and [employer]  -  1 x daily - 7 x weekly - 3 sets - 10 reps    GOALS: Goals reviewed with patient? Yes  SHORT TERM GOALS: Target date: 12/22/2021    Patient will be independent in home exercise program to improve strength/mobility for better functional independence with ADLs. Baseline: No HEP currently, (10/10) not compliantGoal status: INITIAL   LONG TERM GOALS: Target date: 02/16/2022  1.  Patient (> 49 years old) will complete five times sit to stand test in < 15 seconds indicating an increased LE strength and improved balance. Baseline: 16.95 01/02/22: 13 sec Goal status: MET  2.  Patient will increase FOTO score to equal to or greater than  60   to demonstrate statistically significant improvement in mobility and quality of life.  Baseline: Test next session 01/02/22: 92 Goal status: MET   3.  Patient will increase Berg Balance score by > 6 points to demonstrate decreased fall risk during functional activities. Baseline: 44 01/02/22: 50/56 Goal status: MET   4.  Patient will improve DGI by 5 points or more to reduce fall risk and demonstrate improved gait ability. Baseline: 10 01/02/22: 14 Goal status: PROGRESSING  5.  Patient will increase 10 meter walk test to >1.33ms as to improve gait speed for better community ambulation and to reduce fall risk. Baseline: .740m 01/02/22: 0.9 m/s Goal status: PROGRESSING  6.  Patient will increase Berg Balance score by > 6 points to demonstrate decreased fall risk during functional activities. Baseline: 44 01/02/22: 50/56 Goal status: PROGRESSING  7. Patient will increase FGA score by > 4 points to demonstrate decreased fall risk during functional activities. Baseline: 18/30 Goal status: NEW   ASSESSMENT:  CLINICAL IMPRESSION:   Pt performed well with all activities and enjoyed doing the obstacle course and the Korebalance tasks.  Pt is making significant improvement with balance, still having difficulty with the ambulation along the hedgehogs, but manages with min-modA for prevention of LOB.  Pt able also able to tolerate improved quad and hamstring activation with the  cable column activities.   Pt will continue to benefit from skilled therapy to address remaining deficits in order to improve overall QoL and return to PLOF.     OBJECTIVE IMPAIRMENTS Abnormal gait, cardiopulmonary status limiting activity, decreased activity tolerance, decreased balance, decreased endurance, decreased mobility, difficulty walking, decreased strength, and decreased safety awareness.   ACTIVITY LIMITATIONS lifting, bending, standing, squatting, stairs, transfers, and locomotion level  PARTICIPATION LIMITATIONS: meal prep, shopping, and community activity  PERSONAL FACTORS Age and 3+ comorbidities: left-sided carotid stenosis, non-insulin-dependent diabetes mellitus type 2, PAD, GERD, mild COPD, history of tobacco use, hyperlipidemia, hypertension  are also affecting patient's functional outcome.   REHAB POTENTIAL: Good  CLINICAL DECISION MAKING: Evolving/moderate complexity  EVALUATION COMPLEXITY: Moderate   PLAN:  PT FREQUENCY: 2x/week  PT DURATION: 12 weeks  PLANNED INTERVENTIONS: Therapeutic exercises, Therapeutic activity, Neuromuscular re-education, Balance training, Gait training, Patient/Family education, Self Care, Joint mobilization, Stair training, Moist heat, and Manual therapy  PLAN FOR NEXT SESSION:   Continue with functional/dynamic balance interventions   JoGwenlyn SaranPT, DPT Physical Therapist- [facility]11/09/23, 1:40 PM

## 2022-02-01 NOTE — Therapy (Signed)
OUTPATIENT SPEECH LANGUAGE PATHOLOGY TREATMENT NOTE   Patient Name: Gerald Bauer MRN: [id number] DOB:Nov 27, 1945, 76 y.o., male Today's Date: 02/01/2022  PCP: [name], PA-C REFERRING PROVIDER: Reesa Chew, PA-C   END OF SESSION:   End of Session - 02/01/22 1311     Visit Number 22    Number of Visits 45    Date for SLP Re-Evaluation 04/25/22    SLP Start Time 70    SLP Stop Time  1200    SLP Time Calculation (min) 60 min    Activity Tolerance Patient tolerated treatment well             Past Medical History:  Diagnosis Date   Acid reflux    Arthritis    hands   Carotid atherosclerosis    Diabetes mellitus without complication ([place])    Ganglion cyst 02/23/2015   Hyperlipemia    Hypertension    Joint pain in fingers of right hand 02/23/2015   Dominant hand   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 07/11/2015   Prostate cancer ([place])    history   Stroke (cerebrum) ([place]) 10/06/2021   Tachycardia    Tinea pedis of both feet 01/10/2017   Past Surgical History:  Procedure Laterality Date   [place]  2013   PROSTATECTOMY  2012   Patient Active Problem List   Diagnosis Date Noted   Hemiparesis, aphasia, and dysphagia as late effects of stroke ([place]) 11/07/2021   Lower urinary tract symptoms (LUTS) 11/07/2021   Myalgia due to statin 02/21/2021   Type 2 diabetes mellitus without complication, without long-term current use of insulin ([place]) 02/21/2021   Aortic atherosclerosis ([place]) 04/27/2020   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   Onychomycosis of multiple toenails with type 2 diabetes mellitus ([place]) 07/19/2016   Carotid stenosis 03/13/2016   Hyperlipidemia associated with type 2 diabetes mellitus ([place])    Hypertension associated with type 2 diabetes mellitus ([place])    Personal history of prostate cancer    Carotid atherosclerosis    GERD (gastroesophageal reflux disease)  08/20/2013    ONSET DATE: 10/05/2021    REFERRING DIAG: Cerebral infarction due to unspecified occlusion of left middle cerebral artery  THERAPY DIAG:  Verbal apraxia  Aphasia Verbal apraxia  Rationale for Evaluation and Treatment Rehabilitation  SUBJECTIVE STATEMENT:             "Thank-you for the (support)"   Pt accompanied by: daughter in law [name]  PERTINENT HISTORY: Pt  is a 76 year old male with history of left-sided carotid stenosis, non-insulin-dependent diabetes mellitus type 2, PAD, GERD, mild COPD, history of tobacco use, hyperlipidemia, hypertension, who presented 10/05/21 to [facility] ED with facial droop, expressive aphasia, and R sided weakness and found to have left MCA CVA. He was admitted to [facility] 7/17-10/31/21. Advanced to dysphagia 3/thin liquids by cup by time of d/c from CIR. 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 Difficult to fully assess cognition given severity of linguistic impairment; however, does present with decreased safety awareness, diminished problem-solving, emergent awareness, and mild impulsivity with movement."     DIAGNOSTIC FINDINGS: MBS 10/19/21: "Pt presents with overall mild oropharyngeal dysphagia. Oral phase c/b piecemeal deglutition, decreased mastication, decreased bolus cohesion, premature spillage, and weak lingual manipulation. Pharyngeal phase c/b reduced pharyngeal peristalsis + reduced BOT approximation to PPW, which results in  mild to moderate vallecular and pyriform sinuses residuals (vallecula > pyriform sinuses for solids, pyriform sinuses > vallecula for liquids). No penetration nor aspiration appreciated with thin liquid via cup, Dysphagia 1 (puree), Dysphagia 2 (chopped), Dysphagia 3 (mechanical soft), or with 13 mm Barium tablet placed in puree. Once instance of sensed aspiration with intake of large bolus of thin liquid via straw due to  swallow initiation delay to pyriform sinuses and mistiming of swallow-breath cycle; despite strong reflexive cough aspirant was not fully cleared from trachea. Pt's level of swallow initiation was noted to vary with liquid consumption (over base of epiglottis and at pyriform sinuses) with level of swallow initiation for solids to be consistent at the vallecula. Pharyngeal stasis was partially cleared with reflexive secondary swallow." MRI brain 10/05/21: 1. 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 No associated hemorrhage or significant regional mass effect. 2. Loss of normal flow voids throughout the left ICA and MCA, consistent with slow flow and/or occlusion. 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 3. Underlying mild chronic microvascular ischemic disease. Tiny remote left ACA distribution infarct.  PAIN:  Are you having pain? No  PATIENT GOALS talk better   OBJECTIVE:    TODAY'S TREATMENT:  Utilized script training for self-introduction. Pt generated sentences re: personal interests and his stroke with occasional mod cues. SLP used script, pacing, and repetition for 6 sentence introduction which was 80% intelligible with occasional min-mod cues for correction of breakdowns. Pt used script when introducing himself on an introductory call with leader for a local aphasia community group ([facility]AP). SLP used supported conversation to facilitate pt expressing his choices and interests. Pt required cues intermittently for correction of errors, even when he did appear aware of them.    PATIENT EDUCATION: Education details: see today's treatment for details Person educated: Patient Education method: Explanation, Demonstration, and written supports Education comprehension: verbalized understanding and needs further education     GOALS: Goals reviewed with patient? Yes   SHORT TERM GOALS: Target date: 10 sessions    Patient will participate in clinical assessment of swallow function with goals added as needed. Baseline: Goal status: MET   2.  Pt will communicate emergency information 100% accuracy using visual aid if necessary.  Baseline:  Goal status: MET   3.  Pt will approximate personally relevant words and phrases >80% accuracy using script training and min visual cues for apraxia.   Baseline:  Goal status: MET   4.  Pt will generate at least 4 descriptors of target word 80% of the time using semantic feature analysis to improve abilities in wordfinding and resolving communication breakdowns.  Baseline:  Goal status: MET   5.  Pt will complete HEP for dysphagia with rare min A. Baseline:  Goal status: MET   6.  Pt will generate sentences using personally relevant words list for >80% intelligibility. Baseline: 50% intelligibility Goal status: MET   7.  Pt will improve expressive language skills by taking 4-6 turns in simple conversation, with supported conversation strategies if needed. Baseline:  Goal status: MET 8.  Pt will generate sentences moderately complex sentences 8-10 words >80% intelligibility, with self-correction/multimodal communication allowed. Baseline: 50% intelligibility Goal status: INITIAL   9.  Patient will use intelligibility strategies and script training for 4-6 turn telephone exchange with family member or friend. Baseline:  Goal status: INITIAL     LONG TERM GOALS: Target date: 04/25/2022    Pt will engage in 5-8  minutes simple-mod complex conversation re: topic of interest with supported conversation, aphasia compensations.  Baseline: 01/25/22: 5 turn exchange. Goal renewed 01/25/22 Goal status: IN PROGRESS   2.  Pt will ID and attempt repair of communication breakdowns >90% of the time in session.    Baseline: 01/25/22: requires occasional mod cues to initiate correction; renewed 01/25/22 Goal status: IN PROGRESS   3.  Pt/family will demonstrate  knowledge of community resources and activities to support language/communication. Baseline: 01/25/22: referral completed to [facility]; pt has yet to register for group, renewed 01/25/22 Goal status: IN PROGRESS   4.  Pt will demonstrate use of swallowing precautions independently with s/sx aspiration <5% of trials. Baseline: 01/25/22: no overt s/sx when sipping water in sessions, pt and family report no issues at home with meals Goal status: DEFERRED      ASSESSMENT:   CLINICAL IMPRESSION: Patient presents with moderate Broca's aphasia, moderate verbal apraxia, and mild dysarthria. With encouragement, extended time and mod cues, pt able to repair of breakdowns using multimodal means. Pt now initiating use of strategies to slow rate and improve accuracy of verbal output. Continues to improve error awareness and attempts at self-correction. Patient remains limited in his ability to relay thoughts, feelings, and non-contextual or abstract information. Pt remains motivated and is demonstrating excellent progress toward goals. I recommend skilled ST to improve pt's language and motor speech skills to improve communication and reduce frustration.    OBJECTIVE IMPAIRMENTS include expressive language, receptive language, aphasia, apraxia, dysarthria, and dysphagia. These impairments are limiting patient from managing medications, managing appointments, managing finances, household responsibilities, ADLs/IADLs, effectively communicating at home and in community, and safety when swallowing. Factors affecting potential to achieve goals and functional outcome are cooperation/participation level; pt appears highly motivated with good family support. Patient will benefit from skilled SLP services to address above impairments and improve overall function.   REHAB POTENTIAL: Excellent   PLAN: SLP FREQUENCY: 2x/week   SLP DURATION: 12 weeks   PLANNED INTERVENTIONS: Aspiration precaution training, Pharyngeal  strengthening exercises, Diet toleration management , Language facilitation, Environmental controls, Trials of upgraded texture/liquids, Cognitive reorganization, Internal/external aids, Functional tasks, Multimodal communication approach, SLP instruction and feedback, Compensatory strategies, and Patient/family education   [name], MS, CCC-SLP Speech-Language Pathologist [phone]   [name], [place] 02/01/2022, 1:12 PM  [facility] [address] [place], [place], [address] Phone: [phone]   Fax:  [phone]

## 2022-02-01 NOTE — Therapy (Signed)
OUTPATIENT OCCUPATIONAL THERAPY NEURO TREATMENT Patient Name: Gerald Bauer MRN: [id number] DOB:[birth year], 76 y.o., male Today's Date: 01/30/2022  PCP: Threasa Alpha, PA REFERRING PROVIDER: Reesa Chew    OT End of Session - 02/01/22 0828     Visit Number 21    Number of Visits 34    Date for OT Re-Evaluation 04/17/22    Authorization Time Period 01/23/2022    OT Start Time 97    OT Stop Time 1142    OT Time Calculation (min) 52 min    Activity Tolerance Patient tolerated treatment well    Behavior During Therapy [facility] for tasks assessed/performed                    Past Medical History:  Diagnosis Date   Acid reflux    Arthritis    hands   Carotid atherosclerosis    Diabetes mellitus without complication ([place])    Ganglion cyst 02/23/2015   Hyperlipemia    Hypertension    Joint pain in fingers of right hand 02/23/2015   Dominant hand   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 07/11/2015   Prostate cancer ([place])    history   Stroke (cerebrum) ([place]) 10/06/2021   Tachycardia    Tinea pedis of both feet 01/10/2017   Past Surgical History:  Procedure Laterality Date   [place]  2013   PROSTATECTOMY  2012   Patient Active Problem List   Diagnosis Date Noted   Hemiparesis, aphasia, and dysphagia as late effects of stroke ([place]) 11/07/2021   Lower urinary tract symptoms (LUTS) 11/07/2021   Myalgia due to statin 02/21/2021   Type 2 diabetes mellitus without complication, without long-term current use of insulin ([place]) 02/21/2021   Aortic atherosclerosis ([place]) 04/27/2020   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   Onychomycosis of multiple toenails with type 2 diabetes mellitus ([place]) 07/19/2016   Carotid stenosis 03/13/2016   Hyperlipidemia associated with type 2 diabetes mellitus ([place])    Hypertension associated with type 2 diabetes mellitus ([place])    Personal history of prostate cancer     Carotid atherosclerosis    GERD (gastroesophageal reflux disease) 08/20/2013    ONSET DATE: 10/05/21  REFERRING DIAG: L MCA CVA  THERAPY DIAG:  Muscle weakness (generalized)  Other lack of coordination  Rationale for Evaluation and Treatment Rehabilitation  SUBJECTIVE:    SUBJECTIVE STATEMENT:  Patient reports his shoulder has been hurting a lot today, eight out of 10 at the beginning of session. PT reports elevated blood pressure this day, patient to follow up with MD. BP in session was 136/100.      PERTINENT HISTORY: Per chart, Gerald Bauer is a 76 year old right-handed male with history of hypertension, hyperlipidemia, diabetes mellitus, prostate cancer/prostatectomy 2012, quit smoking 24 years ago.  Presented to [facility] 10/05/2021 with right side weakness/facial droop and expressive aphasia.  Blood pressure 155/102.  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  No associated hemorrhage or significant mass effect.    PAIN:  Are you having pain?  5/10 R shoulder; heat, rest, gentle stretch affective for pain management FALLS: Has patient fallen in last 6 months? Yes. Number of falls 3 since CVA  PLOF: Independent/retired Dealer  PATIENT GOALS : Pt points to his hand (wants to be able to use his hand)  OBJECTIVE:   HAND DOMINANCE: Right   FUNCTIONAL OUTCOME MEASURES:  FOTO: 48  UUPPER EXTREMITY ROM      Active ROM Right eval Left Eval WNL Right 12/11/21 Right 01/23/2022  Shoulder flexion 90 (135)   95 (110) 95  Shoulder abduction 95 (110)   80 (90) 95  Wrist flex     54 60  Wrist ext     41 45    UPPER EXTREMITY MMT:      MMT Right eval Left Eval 5/5 Right 12/11/21  Shoulder flexion 3-   -3  Shoulder abduction 3-   -3  Shoulder adduction        Shoulder extension        Shoulder internal rotation 3-      Shoulder external rotation 3-      Middle trapezius        Lower trapezius        Elbow flexion 4-   4   Elbow extension 4+   5  Wrist flexion 4   NT d/t pain  Wrist extension 4-   NT d/t pain  (Blank rows = not tested)   HAND FUNCTION: Eval:         Grip strength: Right: 14 lbs; Left: 58 lbs, Lateral pinch: Right: 7 lbs, Left: 19 lbs, and 3 point pinch: Right: 2 lbs, Left: 16 lbs 10th visit: Grip strength: Right: 16  lbs;                                Lateral pinch: Right: 9  lbs;                            3 point pinch: Right: 5 lbs 20th visit: Grip strength: Right: 20 lbs; Left: 70 lbs, Lateral pinch: Right: 10 lbs, Left: 20 lbs, and 3 point pinch: Right: 9 lbs, Left: 20 lbs    Eval: COORDINATION: Finger Nose Finger test: difficult/lacks precision with reaching targets 9 Hole Peg test: Right: unable sec; Left: 27 sec Able to oppose 2nd and 3rd digits to thumb on R hand   10th visit 9 hole Peg test: Right: 11 min, 44 sec   20th visit:  9 hole Peg test: Right: 3 min 6 sec    TODAY'S TREATMENT:  Moist heat to right shoulder prior to exercises for five minutes to decrease pain and increase tissue mobility   Manual therapy: patient seen in sidelying for scapular mobilization, for elevation, retraction, and upward rotation by therapist, followed by active assistive range of motion for the same movement patterns. Joint mobilizations to posterior and inferior joint capsule at shoulder, grade II to decrease pain and increase motion.    Therapeutic exercise:  Following manual therapy, patient was seen for passive range of motion of right shoulder, shoulder flexion to 100, abduction to 90, elbow, forearm, wrist, and hand.  Focused on passive range of motion for finger extension with prolonged stretching.  Facilitation of right shoulder flexion to 90 of motion with place and hold  for 10 repetitions. Advanced to place and hold with external perturbations for 10 repetitions, followed by small, circular motions in both directions with arm maintained at 90 of flexion, then performing crosses/ plus  signs with arm. Therapist assisting as needed with exercises to support at elbow to maintain position.  Range of motion with medium therapy ball for shoulder flexion to 90, 10 repetitions, chest press, and alternate shoulder taps performed in  supine, therapist cues and assist as needed for proper form, technique, and pacing of exercises.  Neuromuscular reeducation: Patient seen for fine motor coordination tasks of turning and flipping of [place] desks, increased effort with right hand and cues for isolation of finger movements. Patient able to stock up to six desks at a time without knocking over. Manipulation of ball pegs to place and remove from grid. Cues for prehension patterns.       PATIENT EDUCATION: Education details: HEP progression Person educated: pt Education method: Merchandiser, retail cues, handout Education comprehension: verbalized understanding, returned demo   HOME EXERCISE PROGRAM: Theraputty, hand writing skills, table slides  GOALS: Goals reviewed with patient? Yes   SHORT TERM GOALS: Target date: /23   Pt will be indep to perform HEP for increasing strength and coordination throughout RUE.  Baseline: Not yet initiated; 10th: putty given but pt reports limited use, instructed in table slides and self PROM for R wrist/digits. 20th:  Pt performing exercises but requires continual upgrades and instruction  Goal status: ongoing   2.  Pt will manage clothing fasteners with extra time, using RUE as an assist  Baseline: unable; pt has elastic laces and wears elastic waisted pants; 10th: pt uses R hand as an assist to zip and button jeans with extra time; not yet tried small buttons on a shirt.  Pt continues to use elastic laces on shoes. 20th:  Improving with manipulation skills but still using elastic laces, able to button pants and shirt with increased time and effort.  Goal status: ongoing   LONG TERM GOALS: Target date: /23   Pt will increase FOTO score to  55 or better to indicate improved performance with daily tasks.  Baseline: 48; 10th: 48; 20th: score:42  Goal status: ongoing   2.  Pt will increase R grip strength by 10 or more lbs to enable pt to hold and carry light ADL supplies without dropping.  Baseline: R grip 14#, L 58#; 10th visit: R grip 16; 20th visit: 20# Goal status: ongoing   3.  Pt will increase RUE strength to be able to engage RUE into ADLs at least 50% of the time. Baseline: Pt using L non-dominant arm to manage ADLs; 10th visit: Son reports he constantly sees pt attempt to use his R arm for daily tasks, but continues to be limited and still must use LUE for most tasks (see chart for RUE MMT) 20th:  continues to improve on strength and is working on consistency of use  Goal status: ongoing   4.  Pt will complete 9 hole peg test on the R in 3 min or less to work towards ability to use R hand to pick up small ADL supplies.  Baseline: Pt can remove a peg but can not pick one up; 10th visit: R 9 hole completion 11 min 44 sec; 20th visit:   Pt improved to 3 mins and 6 secs, nearing goal.  Goal status: ongoing    ASSESSMENT:  CLINICAL IMPRESSION:' Patient with significant pain on arrival this date, eight out of 10 pain in right shoulder. By end of session patient reported no pain in shoulder.  Patient responded well to manual therapy techniques, followed by facilitation of range of motion of shoulder in supine up to 90 of shoulder flexion. Patient also advancing with functional hand exercises with focus on manipulation of [place] discs with turning/flipping and stacking performed from seated position. Patient continues to benefit from skilled occupational therapy intervention to  maximize safety and independence in necessary daily tasks at home and in the community.       PERFORMANCE DEFICITS in functional skills including ADLs, IADLs, coordination, dexterity, ROM, strength, pain, flexibility, FMC, GMC, mobility, balance,  continence, decreased knowledge of use of DME, and UE functional use, cognitive skills including safety awareness, and psychosocial skills including.   IMPAIRMENTS are limiting patient from ADLs, IADLs, leisure, and social participation.   COMORBIDITIES may have co-morbidities  that affects occupational performance. Patient will benefit from skilled OT to address above impairments and improve overall function.  MODIFICATION OR ASSISTANCE TO COMPLETE EVALUATION: Min-Moderate modification of tasks or assist with assess necessary to complete an evaluation.  OT OCCUPATIONAL PROFILE AND HISTORY: Problem focused assessment: Including review of records relating to presenting problem.  CLINICAL DECISION MAKING: Moderate - several treatment options, min-mod task modification necessary  REHAB POTENTIAL: Good  EVALUATION COMPLEXITY: Moderate    PLAN: OT FREQUENCY: 2x/week  OT DURATION: 12 weeks  PLANNED INTERVENTIONS: self care/ADL training, therapeutic exercise, therapeutic activity, neuromuscular re-education, manual therapy, passive range of motion, balance training, functional mobility training, moist heat, cryotherapy, patient/family education, cognitive remediation/compensation, energy conservation, coping strategies training, and DME and/or AE instructions  RECOMMENDED OTHER SERVICES: N/A  CONSULTED AND AGREED WITH PLAN OF CARE: Patient and family member/caregiver  PLAN FOR NEXT SESSION: HEP progression, neuro re-ed, therapeutic exercises   Tinley Rought T Earlie Arciga, OTR/L, CLT [place], OT 02/01/2022, 8:13 PM

## 2022-02-01 NOTE — Telephone Encounter (Signed)
Please schedule 12 month F/U appt for 90 day refills. Thank you! 

## 2022-02-03 NOTE — Therapy (Signed)
OUTPATIENT OCCUPATIONAL THERAPY NEURO TREATMENT Patient Name: Gerald Bauer MRN: [id number] DOB:11/28/1945, 76 y.o., male Today's Date: 01/30/2022  PCP: Threasa Alpha, PA REFERRING PROVIDER: Reesa Chew    OT End of Session - 02/03/22 1915     Visit Number 22    Number of Visits 71    Date for OT Re-Evaluation 04/17/22    OT Start Time 0930    OT Stop Time 3825    OT Time Calculation (min) 45 min    Activity Tolerance Patient tolerated treatment well    Behavior During Therapy [facility] for tasks assessed/performed                    Past Medical History:  Diagnosis Date   Acid reflux    Arthritis    hands   Carotid atherosclerosis    Diabetes mellitus without complication ([place])    Ganglion cyst 02/23/2015   Hyperlipemia    Hypertension    Joint pain in fingers of right hand 02/23/2015   Dominant hand   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 07/11/2015   Prostate cancer ([place])    history   Stroke (cerebrum) ([place]) 10/06/2021   Tachycardia    Tinea pedis of both feet 01/10/2017   Past Surgical History:  Procedure Laterality Date   [place]  2013   PROSTATECTOMY  2012   Patient Active Problem List   Diagnosis Date Noted   Hemiparesis, aphasia, and dysphagia as late effects of stroke ([place]) 11/07/2021   Lower urinary tract symptoms (LUTS) 11/07/2021   Myalgia due to statin 02/21/2021   Type 2 diabetes mellitus without complication, without long-term current use of insulin ([place]) 02/21/2021   Aortic atherosclerosis ([place]) 04/27/2020   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   Onychomycosis of multiple toenails with type 2 diabetes mellitus ([place]) 07/19/2016   Carotid stenosis 03/13/2016   Hyperlipidemia associated with type 2 diabetes mellitus ([place])    Hypertension associated with type 2 diabetes mellitus ([place])    Personal history of prostate cancer    Carotid atherosclerosis    GERD  (gastroesophageal reflux disease) 08/20/2013    ONSET DATE: 10/05/21  REFERRING DIAG: L MCA CVA  THERAPY DIAG:  Muscle weakness (generalized)  Other lack of coordination  Left middle cerebral artery stroke ([place])  Rationale for Evaluation and Treatment Rehabilitation  SUBJECTIVE:    SUBJECTIVE STATEMENT:   Pt arrived with daughter-in-law today.  Reports shoulder is somewhat better today.   PERTINENT HISTORY: Per chart, Gerald Bauer is a 76 year old right-handed male with history of hypertension, hyperlipidemia, diabetes mellitus, prostate cancer/prostatectomy 2012, quit smoking 24 years ago.  Presented to [facility] 10/05/2021 with right side weakness/facial droop and expressive aphasia.  Blood pressure 155/102.  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  No associated hemorrhage or significant mass effect.    PAIN:  Are you having pain?  3/10 R shoulder; heat, rest, gentle stretch affective for pain management FALLS: Has patient fallen in last 6 months? Yes. Number of falls 3 since CVA  PLOF: Independent/retired Dealer  PATIENT GOALS : Pt points to his hand (wants to be able to use his hand)  OBJECTIVE:   HAND DOMINANCE: Right   FUNCTIONAL OUTCOME MEASURES: FOTO: 48  UUPPER EXTREMITY ROM      Active ROM Right eval Left Eval WNL Right 12/11/21 Right 01/23/2022  Shoulder flexion 90 (135)  95 (110) 95  Shoulder abduction 95 (110)   80 (90) 95  Wrist flex     54 60  Wrist ext     41 45    UPPER EXTREMITY MMT:      MMT Right eval Left Eval 5/5 Right 12/11/21  Shoulder flexion 3-   -3  Shoulder abduction 3-   -3  Shoulder adduction        Shoulder extension        Shoulder internal rotation 3-      Shoulder external rotation 3-      Middle trapezius        Lower trapezius        Elbow flexion 4-   4  Elbow extension 4+   5  Wrist flexion 4   NT d/t pain  Wrist extension 4-   NT d/t pain  (Blank rows = not  tested)   HAND FUNCTION: Eval:         Grip strength: Right: 14 lbs; Left: 58 lbs, Lateral pinch: Right: 7 lbs, Left: 19 lbs, and 3 point pinch: Right: 2 lbs, Left: 16 lbs 10th visit: Grip strength: Right: 16  lbs;                                Lateral pinch: Right: 9  lbs;                            3 point pinch: Right: 5 lbs 20th visit: Grip strength: Right: 20 lbs; Left: 70 lbs, Lateral pinch: Right: 10 lbs, Left: 20 lbs, and 3 point pinch: Right: 9 lbs, Left: 20 lbs    Eval: COORDINATION: Finger Nose Finger test: difficult/lacks precision with reaching targets 9 Hole Peg test: Right: unable sec; Left: 27 sec Able to oppose 2nd and 3rd digits to thumb on R hand   10th visit 9 hole Peg test: Right: 11 min, 44 sec   20th visit:  9 hole Peg test: Right: 3 min 6 sec    TODAY'S TREATMENT:  Therapeutic Exercise: Facilitated hand strengthening with use of hand gripper set at 11.2# to remove jumbo pegs from pegboard x2 trials using R hand.  Performed AROM for the R wrist all planes, mod vc to maximize end range.  Progressed to isometric strengthening with 2# dumbbell hold for neutral wrist positioning with flex, ext, and RD with 30 sec holds x3 reps each.  Constant monitoring for pain, and tactile cues for maintaining neutral wrist.   Self Care: Facilitated hand writing skills.  Pt practiced tracing and drawing vertical, horizontal, and circular lines.  Pt practiced writing his name, requiring max vc to increase line height and letter spacing for increased legibility.  Pt worked with lined paper and a rubber built up grip.     PATIENT EDUCATION: Education details: HEP progression, Engineer, production Person educated: pt Education method: Explanation and Verbal cues, handout Education comprehension: verbalized understanding, returned demo   HOME EXERCISE PROGRAM: Theraputty, hand writing skills, table slides  GOALS: Goals reviewed with patient? Yes   SHORT TERM GOALS: Target date:  /23   Pt will be indep to perform HEP for increasing strength and coordination throughout RUE.  Baseline: Not yet initiated; 10th: putty given but pt reports limited use, instructed in table slides and self PROM for R wrist/digits. 20th:  Pt performing exercises but requires continual upgrades  and instruction  Goal status: ongoing   2.  Pt will manage clothing fasteners with extra time, using RUE as an assist  Baseline: unable; pt has elastic laces and wears elastic waisted pants; 10th: pt uses R hand as an assist to zip and button jeans with extra time; not yet tried small buttons on a shirt.  Pt continues to use elastic laces on shoes. 20th:  Improving with manipulation skills but still using elastic laces, able to button pants and shirt with increased time and effort.  Goal status: ongoing   LONG TERM GOALS: Target date: /23   Pt will increase FOTO score to 55 or better to indicate improved performance with daily tasks.  Baseline: 48; 10th: 48; 20th: score:42  Goal status: ongoing   2.  Pt will increase R grip strength by 10 or more lbs to enable pt to hold and carry light ADL supplies without dropping.  Baseline: R grip 14#, L 58#; 10th visit: R grip 16; 20th visit: 20# Goal status: ongoing   3.  Pt will increase RUE strength to be able to engage RUE into ADLs at least 50% of the time. Baseline: Pt using L non-dominant arm to manage ADLs; 10th visit: Son reports he constantly sees pt attempt to use his R arm for daily tasks, but continues to be limited and still must use LUE for most tasks (see chart for RUE MMT) 20th:  continues to improve on strength and is working on consistency of use  Goal status: ongoing   4.  Pt will complete 9 hole peg test on the R in 3 min or less to work towards ability to use R hand to pick up small ADL supplies.  Baseline: Pt can remove a peg but can not pick one up; 10th visit: R 9 hole completion 11 min 44 sec; 20th visit:   Pt improved to 3 mins and 6  secs, nearing goal.  Goal status: ongoing    ASSESSMENT:  CLINICAL IMPRESSION:' Pt reporting improvement in R shoulder pain today, and declined moist heat at beginning of session.  Pt continues to progress with R hand strength and coordination skills.  Pt tolerated hand gripper to remove jumbo pegs from pegboard for 2 trials on 11.2# grip setting.  Pt continues to note improvement in R wrist mobility, and had no pain with AROM (no resistance) for the R wrist all planes, and no pain with wrist isometrics with a 2# weight.  Pt reports that he has not been using his wrist brace and has had no issues.  Continued focus on handwriting skills.  Pt practiced tracing and drawing vertical, horizontal, and circular lines.  Pt practiced writing his name, requiring max vc to increase line height and letter spacing for increased legibility.  Pt worked with lined paper and a rubber built up grip.  Patient continues to benefit from skilled occupational therapy intervention to maximize safety, independence, and efficient use of RUE into necessary daily tasks at home and in the community.   PERFORMANCE DEFICITS in functional skills including ADLs, IADLs, coordination, dexterity, ROM, strength, pain, flexibility, FMC, GMC, mobility, balance, continence, decreased knowledge of use of DME, and UE functional use, cognitive skills including safety awareness, and psychosocial skills including.   IMPAIRMENTS are limiting patient from ADLs, IADLs, leisure, and social participation.   COMORBIDITIES may have co-morbidities  that affects occupational performance. Patient will benefit from skilled OT to address above impairments and improve overall function.  MODIFICATION OR ASSISTANCE TO  COMPLETE EVALUATION: Min-Moderate modification of tasks or assist with assess necessary to complete an evaluation.  OT OCCUPATIONAL PROFILE AND HISTORY: Problem focused assessment: Including review of records relating to presenting  problem.  CLINICAL DECISION MAKING: Moderate - several treatment options, min-mod task modification necessary  REHAB POTENTIAL: Good  EVALUATION COMPLEXITY: Moderate    PLAN: OT FREQUENCY: 2x/week  OT DURATION: 12 weeks  PLANNED INTERVENTIONS: self care/ADL training, therapeutic exercise, therapeutic activity, neuromuscular re-education, manual therapy, passive range of motion, balance training, functional mobility training, moist heat, cryotherapy, patient/family education, cognitive remediation/compensation, energy conservation, coping strategies training, and DME and/or AE instructions  RECOMMENDED OTHER SERVICES: N/A  CONSULTED AND AGREED WITH PLAN OF CARE: Patient and family member/caregiver  PLAN FOR NEXT SESSION: HEP progression, neuro re-ed, therapeutic exercises  Leta Speller, MS, OTR/L  Darleene Cleaver, OT 02/03/2022, 7:17 PM

## 2022-02-06 ENCOUNTER — Ambulatory Visit: Payer: Medicare Other

## 2022-02-06 ENCOUNTER — Ambulatory Visit: Payer: Medicare Other | Admitting: Speech Pathology

## 2022-02-06 DIAGNOSIS — R269 Unspecified abnormalities of gait and mobility: Secondary | ICD-10-CM

## 2022-02-06 DIAGNOSIS — Z9079 Acquired absence of other genital organ(s): Secondary | ICD-10-CM | POA: Insufficient documentation

## 2022-02-06 DIAGNOSIS — R2681 Unsteadiness on feet: Secondary | ICD-10-CM

## 2022-02-06 DIAGNOSIS — M6281 Muscle weakness (generalized): Secondary | ICD-10-CM

## 2022-02-06 DIAGNOSIS — R482 Apraxia: Secondary | ICD-10-CM | POA: Diagnosis not present

## 2022-02-06 DIAGNOSIS — R4701 Aphasia: Secondary | ICD-10-CM

## 2022-02-06 DIAGNOSIS — R262 Difficulty in walking, not elsewhere classified: Secondary | ICD-10-CM

## 2022-02-06 DIAGNOSIS — R278 Other lack of coordination: Secondary | ICD-10-CM

## 2022-02-06 NOTE — Therapy (Signed)
OUTPATIENT SPEECH LANGUAGE PATHOLOGY TREATMENT NOTE   Patient Name: Gerald Bauer MRN: [id number] DOB:07/30/1945, 76 y.o., male Today's Date: 02/06/2022  PCP: [name], PA-C REFERRING PROVIDER: Reesa Chew, PA-C   END OF SESSION:   End of Session - 02/06/22 1454     Visit Number 23    Number of Visits 38    Date for SLP Re-Evaluation 04/25/22    SLP Start Time 59    SLP Stop Time  1200    SLP Time Calculation (min) 60 min    Activity Tolerance Patient tolerated treatment well             Past Medical History:  Diagnosis Date   Acid reflux    Arthritis    hands   Carotid atherosclerosis    Diabetes mellitus without complication ([place])    Ganglion cyst 02/23/2015   Hyperlipemia    Hypertension    Joint pain in fingers of right hand 02/23/2015   Dominant hand   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 07/11/2015   Prostate cancer ([place])    history   Stroke (cerebrum) ([place]) 10/06/2021   Tachycardia    Tinea pedis of both feet 01/10/2017   Past Surgical History:  Procedure Laterality Date   [place]  2013   PROSTATECTOMY  2012   Patient Active Problem List   Diagnosis Date Noted   Hemiparesis, aphasia, and dysphagia as late effects of stroke ([place]) 11/07/2021   Lower urinary tract symptoms (LUTS) 11/07/2021   Myalgia due to statin 02/21/2021   Type 2 diabetes mellitus without complication, without long-term current use of insulin ([place]) 02/21/2021   Aortic atherosclerosis ([place]) 04/27/2020   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   Onychomycosis of multiple toenails with type 2 diabetes mellitus ([place]) 07/19/2016   Carotid stenosis 03/13/2016   Hyperlipidemia associated with type 2 diabetes mellitus ([place])    Hypertension associated with type 2 diabetes mellitus ([place])    Personal history of prostate cancer    Carotid atherosclerosis    GERD (gastroesophageal reflux disease)  08/20/2013    ONSET DATE: 10/05/2021    REFERRING DIAG: Cerebral infarction due to unspecified occlusion of left middle cerebral artery  THERAPY DIAG:  Verbal apraxia  Aphasia Verbal apraxia  Rationale for Evaluation and Treatment Rehabilitation  SUBJECTIVE STATEMENT:             "I want to be able to have a discussion with people" (mod cues, multimodal communication necessary for comprehension)   Pt accompanied by: daughter in law [name]  PERTINENT HISTORY: Pt  is a 76 year old male with history of left-sided carotid stenosis, non-insulin-dependent diabetes mellitus type 2, PAD, GERD, mild COPD, history of tobacco use, hyperlipidemia, hypertension, who presented 10/05/21 to [facility] ED with facial droop, expressive aphasia, and R sided weakness and found to have left MCA CVA. He was admitted to [facility] 7/17-10/31/21. Advanced to dysphagia 3/thin liquids by cup by time of d/c from CIR. 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 Difficult to fully assess cognition given severity of linguistic impairment; however, does present with decreased safety awareness, diminished problem-solving, emergent awareness, and mild impulsivity with movement."     DIAGNOSTIC FINDINGS: MBS 10/19/21: "Pt presents with overall mild oropharyngeal dysphagia. Oral phase c/b piecemeal deglutition, decreased mastication, decreased bolus cohesion, premature spillage, and weak lingual manipulation. Pharyngeal  phase c/b reduced pharyngeal peristalsis + reduced BOT approximation to PPW, which results in mild to moderate vallecular and pyriform sinuses residuals (vallecula > pyriform sinuses for solids, pyriform sinuses > vallecula for liquids). No penetration nor aspiration appreciated with thin liquid via cup, Dysphagia 1 (puree), Dysphagia 2 (chopped), Dysphagia 3 (mechanical soft), or with 13 mm Barium tablet placed in puree. Once  instance of sensed aspiration with intake of large bolus of thin liquid via straw due to swallow initiation delay to pyriform sinuses and mistiming of swallow-breath cycle; despite strong reflexive cough aspirant was not fully cleared from trachea. Pt's level of swallow initiation was noted to vary with liquid consumption (over base of epiglottis and at pyriform sinuses) with level of swallow initiation for solids to be consistent at the vallecula. Pharyngeal stasis was partially cleared with reflexive secondary swallow." MRI brain 10/05/21: 1. 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 No associated hemorrhage or significant regional mass effect. 2. Loss of normal flow voids throughout the left ICA and MCA, consistent with slow flow and/or occlusion. 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 3. Underlying mild chronic microvascular ischemic disease. Tiny remote left ACA distribution infarct.  PAIN:  Are you having pain? No  PATIENT GOALS talk better   OBJECTIVE:    TODAY'S TREATMENT:  Continued use of script training, revising pt's script for phone call with son and adding visual cues for syllables/slowing rate. Generated additional scripts for conversation with brother, and for use with PT/OT. Patient read scripts with mod cues to slow rate and correct breakdowns, 85% intelligibility after 5 repetitions for PT/OT script, 82% for brother script. Used audio recording to facilitate awareness.    PATIENT EDUCATION: Education details: see today's treatment for details Person educated: Patient Education method: Explanation, Demonstration, and written supports Education comprehension: verbalized understanding and needs further education     GOALS: Goals reviewed with patient? Yes   SHORT TERM GOALS: Target date: 10 sessions   Patient will participate in clinical assessment of swallow function with goals added as  needed. Baseline: Goal status: MET   2.  Pt will communicate emergency information 100% accuracy using visual aid if necessary.  Baseline:  Goal status: MET   3.  Pt will approximate personally relevant words and phrases >80% accuracy using script training and min visual cues for apraxia.   Baseline:  Goal status: MET   4.  Pt will generate at least 4 descriptors of target word 80% of the time using semantic feature analysis to improve abilities in wordfinding and resolving communication breakdowns.  Baseline:  Goal status: MET   5.  Pt will complete HEP for dysphagia with rare min A. Baseline:  Goal status: MET   6.  Pt will generate sentences using personally relevant words list for >80% intelligibility. Baseline: 50% intelligibility Goal status: MET   7.  Pt will improve expressive language skills by taking 4-6 turns in simple conversation, with supported conversation strategies if needed. Baseline:  Goal status: MET 8.  Pt will generate sentences moderately complex sentences 8-10 words >80% intelligibility, with self-correction/multimodal communication allowed. Baseline: 50% intelligibility Goal status: INITIAL   9.  Patient will use intelligibility strategies and script training for 4-6 turn telephone exchange with family member or friend. Baseline:  Goal status: INITIAL     LONG TERM GOALS: Target date: 04/25/2022    Pt will engage in 5-8 minutes simple-mod complex conversation re: topic of interest with supported conversation,  aphasia compensations.  Baseline: 01/25/22: 5 turn exchange. Goal renewed 01/25/22 Goal status: IN PROGRESS   2.  Pt will ID and attempt repair of communication breakdowns >90% of the time in session.    Baseline: 01/25/22: requires occasional mod cues to initiate correction; renewed 01/25/22 Goal status: IN PROGRESS   3.  Pt/family will demonstrate knowledge of community resources and activities to support language/communication. Baseline:  01/25/22: referral completed to TAP; pt has yet to register for group, renewed 01/25/22 Goal status: IN PROGRESS   4.  Pt will demonstrate use of swallowing precautions independently with s/sx aspiration <5% of trials. Baseline: 01/25/22: no overt s/sx when sipping water in sessions, pt and family report no issues at home with meals Goal status: DEFERRED      ASSESSMENT:   CLINICAL IMPRESSION: Patient presents with moderate Broca's aphasia, moderate verbal apraxia, and mild dysarthria. With encouragement, extended time and mod cues, pt able to repair of breakdowns using multimodal means. Pt now initiating use of strategies to slow rate and improve accuracy of verbal output. Continues to improve error awareness and attempts at self-correction. Shows promise with script training to improve intelligibility for short interactions with family/community. I recommend skilled ST to improve pt's language and motor speech skills to improve communication and reduce frustration.    OBJECTIVE IMPAIRMENTS include expressive language, receptive language, aphasia, apraxia, dysarthria, and dysphagia. These impairments are limiting patient from managing medications, managing appointments, managing finances, household responsibilities, ADLs/IADLs, effectively communicating at home and in community, and safety when swallowing. Factors affecting potential to achieve goals and functional outcome are cooperation/participation level; pt appears highly motivated with good family support. Patient will benefit from skilled SLP services to address above impairments and improve overall function.   REHAB POTENTIAL: Excellent   PLAN: SLP FREQUENCY: 2x/week   SLP DURATION: 12 weeks   PLANNED INTERVENTIONS: Aspiration precaution training, Pharyngeal strengthening exercises, Diet toleration management , Language facilitation, Environmental controls, Trials of upgraded texture/liquids, Cognitive reorganization, Internal/external  aids, Functional tasks, Multimodal communication approach, SLP instruction and feedback, Compensatory strategies, and Patient/family education   [name], MS, CCC-SLP Speech-Language Pathologist [phone]   [name], [place] 02/06/2022, 2:55 PM  [facility] [address] [place], [place], [address] Phone: [phone]   Fax:  [phone]

## 2022-02-06 NOTE — Assessment & Plan Note (Signed)
On amlodipine 5 mg,having high readings when in severe pain with PT Would like to prevent BP from going higher than 130/80's on average BP Readings from Last 3 Encounters:  02/07/22 136/84  12/15/21 (!) 135/91  12/07/21 123/85  Will keep meds the same for now, monitor resting bp occasionally, we will want to adjust meds if it goes higher Reassured that BP should physiologically increase with stress/pain/exercise - this is ok as long as he is not having any concerning sx, like dizziness, HA, CP, SOB

## 2022-02-06 NOTE — Therapy (Signed)
OUTPATIENT PHYSICAL THERAPY NEURO TREATMENT NOTE   Patient Name: Gerald Bauer MRN: [id number] DOB:1946-03-05, 76 y.o., male Today's Date: 02/06/2022   PCP: [name], PA-C REFERRING PROVIDER: Bary Leriche, PA-C   PT End of Session - 02/06/22 1020     Visit Number 16    Number of Visits 24    Date for PT Re-Evaluation 02/16/22    Progress Note Due on Visit 10    PT Start Time 1018    PT Stop Time 1100    PT Time Calculation (min) 42 min    Equipment Utilized During Treatment Gait belt    Activity Tolerance Patient tolerated treatment well    Behavior During Therapy WFL for tasks assessed/performed             Past Medical History:  Diagnosis Date   Acid reflux    Arthritis    hands   Carotid atherosclerosis    Diabetes mellitus without complication ([place])    Ganglion cyst 02/23/2015   Hyperlipemia    Hypertension    Joint pain in fingers of right hand 02/23/2015   Dominant hand   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 07/11/2015   Prostate cancer ([place])    history   Stroke (cerebrum) ([place]) 10/06/2021   Tachycardia    Tinea pedis of both feet 01/10/2017   Past Surgical History:  Procedure Laterality Date   [place]  2013   PROSTATECTOMY  2012   Patient Active Problem List   Diagnosis Date Noted   H/O prostatectomy 02/06/2022   Hemiparesis, aphasia, and dysphagia as late effects of stroke ([place]) 11/07/2021   Lower urinary tract symptoms (LUTS) 11/07/2021   Myalgia due to statin 02/21/2021   Type 2 diabetes mellitus without complication, without long-term current use of insulin ([place]) 02/21/2021   Aortic atherosclerosis ([place]) 04/27/2020   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   Onychomycosis of multiple toenails with type 2 diabetes mellitus ([place]) 07/19/2016   Carotid stenosis 03/13/2016   Hyperlipidemia associated with type 2 diabetes mellitus ([place])    Hypertension  associated with type 2 diabetes mellitus ([place])    Personal history of prostate cancer    Carotid atherosclerosis    GERD (gastroesophageal reflux disease) 08/20/2013    ONSET DATE: 10/05/21  REFERRING DIAG: K06.349 (ICD-10-CM) - Cerebral infarction due to unspecified occlusion or stenosis of left middle cerebral artery  THERAPY DIAG:  Difficulty in walking, not elsewhere classified  Muscle weakness (generalized)  Abnormality of gait and mobility  Unsteadiness on feet  Other lack of coordination  Rationale for Evaluation and Treatment Rehabilitation  SUBJECTIVE:  SUBJECTIVE STATEMENT:  Pt reports having 1/10 pain in the R arm today.  Pt notes he is doing well and has continued to do his exercises at home.   Pt accompanied by: self and brother who is in waiting area.  PAIN today:  Are you having pain? Yes: NPRS scale: 1/10 Pain location: bil shoulders, achey/dull, sharp  PERTINENT HISTORY:Per H and P from 7/17 hospital d/c Pt  is a 76 year old male with history of left-sided carotid stenosis, non-insulin-dependent diabetes mellitus type 2, PAD, GERD, mild COPD, history of tobacco use, hyperlipidemia, hypertension, who presents emergency department for chief concerns of facial droop, expressive aphasia, and R sided weakness  PRECAUTIONS: Fall  WEIGHT BEARING RESTRICTIONS No  FALLS: Has patient fallen in last 6 months? Yes. Number of falls 1  LIVING ENVIRONMENT:  Lives with: lives with their family and lived alone prior to onset  Lives in: House/apartment Stairs: Yes: External: 4 steps; can reach both Has following equipment at home: Walker - 2 wheeled and shower chair  PLOF: Independent  PATIENT GOALS become independent with everyday activities like he was prior to the onset of the  stroke   OBJECTIVE:   DIAGNOSTIC FINDINGS: 1. 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 No associated hemorrhage or significant regional mass effect.  2. Loss of normal flow voids throughout the left ICA and MCA,  consistent with slow flow and/or occlusion. Susceptibility artifact  within proximal left MCA branches consistent with intraluminal  thrombus.  3. Underlying mild chronic microvascular ischemic disease. Tiny  remote left ACA distribution infarct.     MRA HEAD IMPRESSION:   "1. Interval near complete occlusion of the left ICA and MCA,  presumably acute in nature given the presence of the acute left MCA  territory infarct. Left A1 segment not well seen either, also likely  occluded.  2. Otherwise wide patency of the major intracranial arterial  circulation. No other hemodynamically significant or correctable  stenosis.     MRA NECK IMPRESSION:  1. 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 Some attenuated flow seen  distally within the cervical left ICA, with subsequent reocclusion  by the skull base.  2. 50% atheromatous stenosis at the origin of the cervical right  ICA. Otherwise wide patency of the right carotid artery system.  3. Wide patency of the vertebral arteries within the neck.".    COGNITION: Overall cognitive status: Within functional limits for tasks assessed and subjective history limited secondary to aphasia but able to answer 1 word questions appropriately.    SENSATION: WFL  COORDINATION: WNL with heel to shin testing    LOWER EXTREMITY ROM:   AROM WNL    LOWER EXTREMITY MMT:    MMT Right Eval Left Eval  Hip flexion 4 4+  Hip extension    Hip abduction 4 5  Hip adduction 4 5  Hip internal rotation    Hip external rotation    Knee flexion 4 5  Knee extension 4 5  Ankle dorsiflexion 4+ 5  Ankle plantarflexion 4+ 5  Ankle inversion 5 5  Ankle eversion 5 5  (Blank rows =  not tested)   TRANSFERS: Assistive device utilized: Environmental consultant - 2 wheeled  Sit to stand: Modified independence Stand to sit: Complete Independence Chair to chair: SBA  STAIRS:  Level of Assistance: SBA Stair Negotiation Technique: Step to Pattern with Bilateral Rails Number of Stairs: 4  Height of Stairs: 6 in  Comments: Right lower extremity foot catches when a sending steps and patient performs descending steps utilizing right lower extremity for more support.  Patient reports he knows he is doing this "the hard way"  GAIT: Gait pattern: decreased arm swing- Right and decreased step length- Right Distance walked: 10 M Assistive device utilized: Environmental consultant - 2 wheeled and None Level of assistance: Modified independence Comments: difficulty with turns and object navigation per DGI tesitng, able to walk without AD but only in controlled environment without obstacles or significant challenge  FUNCTIONAL TESTs:  5 times sit to stand: 16.95 sec no UE assist 10 meter walk test: .76 m/s without AD  Berg Balance Scale: 44/56 DGI:10  Progress Note 10/10: 5x STS: 13sec no UE support DGI:14/24 BERG: 50/56 10 meter walk test: .09 m/s without AD (normal speed)   PATIENT SURVEYS:  FOTO to retest next session, likely with either asking patient questions or having caregiver complete patient completed but had 97% indicating he could run and jump with these and when questioned about this following patient reports he cannot run or jump with these and would benefit from filling this out again next time.  12/04/2021: 49 (goal 62) 01/02/2022: 92 (goal 62) 01/23/2022: 56 (goal 62)    TODAY'S TREATMENT:  Gait belt donned, CGA provided unless noted otherwise  NMR:  Seated BP prior to session 117/93 mmHg  HR 100 bpm  Ambulation within the the hospital on carpeted surfaces, tile, all flat surfaces, continuous walking for 10 minutes without any necessary rest breaks.  Ambulation outside across  varying surfaces including grass, pavement, wood, pine needles, and pavers with continuous walking for 15 minutes without any necessary rest breaks.  Korebalance Tux Racer for 3 races, +3 stability level Korebalance Neverball for 6 levels, +3 stability level      PATIENT EDUCATION: Education details: Pt educated throughout session about proper posture and technique with exercises. Improved exercise technique, movement at target joints, use of target muscles after min to mod verbal, visual, tactile cues.   Person educated: Patient Education method: Explanation, Demonstration, Tactile cues, and Verbal cues Education comprehension: verbalized understanding, returned demonstration, verbal cues required, tactile cues required, and needs further education   HOME EXERCISE PROGRAM:  No updates today, pt to continue as previously given  9/11: Pt to continue HEP as previously given. However, PT did provide education to use a chair with arm-rests for lateral support when performing STS  10/3: added standing hip ABD/ext with UE support on counter 10/26: updated as below  Access Code: [email or web address] URL: [email or web address] Date: 01/18/2022 Prepared by: [place]  Exercises - Prone Hip Extension  - 1 x daily - 7 x weekly - 3 sets - 10 reps - Standing Hip Abduction with Resistance at Ankles and Counter Support  - 1 x daily - 7 x weekly - 3 sets - 10 reps - Standing Hip Extension with Resistance at Ankles and Counter Support  - 1 x daily - 7 x weekly - 3 sets - 10 reps - Side Stepping with Resistance at Ankles and Counter Support  - 1 x daily - 7 x weekly - 3 sets - 10 reps - Forward and Backward Monster Walk with Resistance at Ankles and Counter Support  - 1 x daily - 7 x weekly - 3 sets - 10 reps - Forward Monster Walk with Resistance at [employer] and Counter Support  - 1 x daily - 7 x weekly - 3 sets -  10 reps    GOALS: Goals reviewed with patient? Yes  SHORT TERM GOALS: Target  date: 12/22/2021   Patient will be independent in home exercise program to improve strength/mobility for better functional independence with ADLs. Baseline: No HEP currently, (10/10) not compliantGoal status: INITIAL   LONG TERM GOALS: Target date: 02/16/2022  1.  Patient (> 58 years old) will complete five times sit to stand test in < 15 seconds indicating an increased LE strength and improved balance. Baseline: 16.95 01/02/22: 13 sec Goal status: MET  2.  Patient will increase FOTO score to equal to or greater than  60   to demonstrate statistically significant improvement in mobility and quality of life.  Baseline: Test next session 01/02/22: 92 Goal status: MET   3.  Patient will increase Berg Balance score by > 6 points to demonstrate decreased fall risk during functional activities. Baseline: 44 01/02/22: 50/56 Goal status: MET   4.  Patient will improve DGI by 5 points or more to reduce fall risk and demonstrate improved gait ability. Baseline: 10 01/02/22: 14 Goal status: PROGRESSING  5.  Patient will increase 10 meter walk test to >1.71ms as to improve gait speed for better community ambulation and to reduce fall risk. Baseline: .726m 01/02/22: 0.9 m/s Goal status: PROGRESSING  6.  Patient will increase Berg Balance score by > 6 points to demonstrate decreased fall risk during functional activities. Baseline: 44 01/02/22: 50/56 Goal status: PROGRESSING  7. Patient will increase FGA score by > 4 points to demonstrate decreased fall risk during functional activities. Baseline: 18/30 Goal status: NEW   ASSESSMENT:  CLINICAL IMPRESSION:   Pt performed well and noted improved ability to perform the exercises on the [facility] [place].  Pt able to achieve greater controlled speed during the Tux Racer levels, and the Neverball was able to successfully clear more levels as well.  Pt to continue with balance related tasks in order to improve overall balance in order to  return to PLOF.   OBJECTIVE IMPAIRMENTS Abnormal gait, cardiopulmonary status limiting activity, decreased activity tolerance, decreased balance, decreased endurance, decreased mobility, difficulty walking, decreased strength, and decreased safety awareness.   ACTIVITY LIMITATIONS lifting, bending, standing, squatting, stairs, transfers, and locomotion level  PARTICIPATION LIMITATIONS: meal prep, shopping, and community activity  PERSONAL FACTORS Age and 3+ comorbidities: left-sided carotid stenosis, non-insulin-dependent diabetes mellitus type 2, PAD, GERD, mild COPD, history of tobacco use, hyperlipidemia, hypertension  are also affecting patient's functional outcome.   REHAB POTENTIAL: Good  CLINICAL DECISION MAKING: Evolving/moderate complexity  EVALUATION COMPLEXITY: Moderate   PLAN:  PT FREQUENCY: 2x/week  PT DURATION: 12 weeks  PLANNED INTERVENTIONS: Therapeutic exercises, Therapeutic activity, Neuromuscular re-education, Balance training, Gait training, Patient/Family education, Self Care, Joint mobilization, Stair training, Moist heat, and Manual therapy  PLAN FOR NEXT SESSION:   Continue with functional/dynamic balance interventions   JoGwenlyn SaranPT, DPT Physical Therapist- [place]11/14/23, 5:24 PM

## 2022-02-06 NOTE — Patient Instructions (Signed)
Call [facility] to make sure you get your Diabetic eye exam done this year

## 2022-02-06 NOTE — Assessment & Plan Note (Signed)
Last A1C 7.1 but near goal Metformin dose was increased quickly to 1000 mg BID and pt had GI SE  We decreased the dose and he is due for recheck DM foot exam done today Due for DM eye exam and screening microalbumin/scr ratio On repatha and norvasc (not statin and ACE/ARB)

## 2022-02-06 NOTE — Progress Notes (Unsigned)
Name: Gerald Bauer   MRN: [id number]    DOB: [birth year]   Date:02/07/2022       Progress Note  Chief Complaint  Patient presents with   Follow-up   Hypertension   Hyperlipidemia   Diabetes     Subjective:   Gerald Bauer is a 76 y.o. male, presents to clinic for f/up on chronic conditions Presents here with his son Gerald Bauer and DIL Gerald who are caring for him since his stroke  They provide 80% of information but Mr. Bauer is able to answer more questions now and converse some  DM:   Pt managing DM with metformin 500 mg once daily - had GI upset with higher dose Reports good med compliance Pt has no SE from meds - GI tolerance of one pill daily Blood sugars not checking Denies: Polyuria, polydipsia, vision changes, neuropathy, hypoglycemia Recent pertinent labs: Lab Results  Component Value Date   HGBA1C 7.1 (H) 10/06/2021   HGBA1C 6.5 (H) 01/23/2021   HGBA1C 7.3 (H) 10/04/2020   Lab Results  Component Value Date   MICROALBUR 0.4 04/25/2020   LDLCALC 44 10/06/2021   CREATININE 0.86 10/30/2021   Standard of care and health maintenance: Urine Microalbumin:  due Foot exam:  due/done today DM eye exam:  they have scheduled in the next month ACEI/ARB:  not taking but should be Statin:  intolerant on repatha  Hypertension:   when doing pt and having a lot of shoulder pain (9/10) BP increased with DBP 100 Currently managed on amlodipine 5 mg  Pt reports good med compliance and denies any SE.   Blood pressure today is well controlled. BP Readings from Last 6 Encounters:  02/07/22 136/84  12/15/21 (!) 135/91  12/07/21 123/85  11/07/21 128/78  10/31/21 130/71  10/09/21 (!) 127/93   Pt denies CP, SOB, exertional sx, LE edema, palpitation, Ha's, visual disturbances, lightheadedness, hypotension, syncope. Dietary efforts for BP?    On plavix indefinitely with recent stroke, no bleeding concerns  Hx of LUTS and prostate CA Lab Results  Component Value Date    PSA1 <0.1 05/09/2015   PSA <0.1 01/10/2017   PSA 0.1 07/11/2016  Urinary sx began inpt, well controlled when they added flomax S/p prostatectomy - last PSA 5-6 years ago, no other hx available With recent LUTS will get PSA screened today  HLD per cardiology - on repatha  Shoulder pain - left shoulder pain x 4+ years, did xrays in 2019 with dr lada His right shoulder is hurting since the stroke - wakes him from sleep, 9/10 pain   Current Outpatient Medications:    acetaminophen (TYLENOL) 325 MG tablet, Take 2 tablets (650 mg total) by mouth every 4 (four) hours as needed for mild pain (or temp > 37.5 C (99.5 F)). (Patient taking differently: Take 650 mg by mouth as needed for mild pain (or temp > 37.5 C (99.5 F)).), Disp: , Rfl:    amLODipine (NORVASC) 5 MG tablet, Take 1 tablet (5 mg total) by mouth daily., Disp: 90 tablet, Rfl: 1   aspirin EC 81 MG tablet, Take 81 mg by mouth daily. Swallow whole., Disp: , Rfl:    Cholecalciferol (VITAMIN D3) 25 MCG (1000 UT) CAPS, Take 1,000 Units by mouth daily., Disp: , Rfl:    clopidogrel (PLAVIX) 75 MG tablet, Take 1 tablet (75 mg total) by mouth daily., Disp: 90 tablet, Rfl: 3   Cyanocobalamin (VITAMIN B12) 500 MCG TABS, Take 500 mcg by mouth daily., Disp: ,  Rfl:    Evolocumab (REPATHA SURECLICK) 403 MG/ML SOAJ, INJECT '140MG'$  SUBCUTANEOUSLY  EVERY 2 WEEKS, Disp: 2 mL, Rfl: 0   magnesium gluconate (MAGONATE) 500 MG tablet, Take 1 tablet (500 mg total) by mouth at bedtime., Disp: 30 tablet, Rfl: 0   metFORMIN (GLUCOPHAGE-XR) 500 MG 24 hr tablet, Take 1 tablet (500 mg total) by mouth 2 (two) times daily with a meal., Disp: 180 tablet, Rfl: 3   ONETOUCH VERIO test strip, CHECK FINGERSTICK BLOOD  SUGARS ONCE DAILY, Disp: 100 strip, Rfl: 2   albuterol (VENTOLIN HFA) 108 (90 Base) MCG/ACT inhaler, Inhale 2 puffs into the lungs every 6 (six) hours as needed for wheezing or shortness of breath. (Patient not taking: Reported on 02/07/2022), Disp: 8 g, Rfl: 0    Lancets (ONETOUCH DELICA PLUS [id number]) MISC, USE TO CHECK BLOOD GLUCOSE  ONCE DAILY AS DIRECTED (Patient not taking: Reported on 02/07/2022), Disp: 100 each, Rfl: 2  Patient Active Problem List   Diagnosis Date Noted   H/O prostatectomy 02/06/2022   Hemiparesis, aphasia, and dysphagia as late effects of stroke ([place]) 11/07/2021   Lower urinary tract symptoms (LUTS) 11/07/2021   Myalgia due to statin 02/21/2021   Type 2 diabetes mellitus without complication, without long-term current use of insulin ([place]) 02/21/2021   Aortic atherosclerosis ([place]) 04/27/2020   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   Onychomycosis of multiple toenails with type 2 diabetes mellitus ([place]) 07/19/2016   Carotid stenosis 03/13/2016   Hyperlipidemia associated with type 2 diabetes mellitus ([place])    Hypertension associated with type 2 diabetes mellitus ([place])    Personal history of prostate cancer    Carotid atherosclerosis    GERD (gastroesophageal reflux disease) 08/20/2013    Past Surgical History:  Procedure Laterality Date   APPENDECTOMY  1973   CHOLECYSTECTOMY  2013   PROSTATECTOMY  2012    Family History  Problem Relation Age of Onset   Dementia Mother    High Cholesterol Mother    Hypertension Mother    Arthritis Mother    Hyperlipidemia Mother    Heart disease Father    Heart attack Father    Arthritis Maternal Grandmother    Cancer Maternal Grandmother        colon   Prostate cancer Brother    Cancer Brother        prostate   Cancer Maternal Uncle        colon   COPD Maternal Uncle    Stroke Paternal Grandmother    Diabetes Paternal Grandmother    Aneurysm Paternal Grandfather     Social History   Tobacco Use   Smoking status: Former    Packs/day: 1.00    Years: 40.00    Total pack years: 40.00    Types: Cigarettes    Quit date: 03/09/1997    Years since quitting: 24.9   Smokeless tobacco: Former   Tobacco comments:    smoking cessation materials not required  Vaping Use   Vaping  Use: Never used  Substance Use Topics   Alcohol use: No    Alcohol/week: 0.0 standard drinks of alcohol   Drug use: No     Allergies  Allergen Reactions   Amoxicillin Swelling   Statins Other (See Comments)    Affects the liver   Welchol [Colesevelam Hcl] Other (See Comments)    affects the liver    Health Maintenance  Topic Date Due   Diabetic kidney evaluation - Urine ACR  01/14/2019   FOOT EXAM  04/25/2021   OPHTHALMOLOGY EXAM  01/31/2022   COVID-19 Vaccine (2 - Booster for Janssen series) 02/23/2022 (Originally 11/03/2019)   INFLUENZA VACCINE  06/24/2022 (Originally 10/24/2021)   COLONOSCOPY (Pts 45-60yr Insurance coverage will need to be confirmed)  11/08/2022 (Originally 10/01/2018)   HEMOGLOBIN A1C  04/08/2022   Medicare Annual Wellness (AWV)  09/15/2022   Diabetic kidney evaluation - GFR measurement  10/31/2022   TETANUS/TDAP  03/04/2024   Pneumonia Vaccine 76 Years old  Completed   Hepatitis C Screening  Completed   Zoster Vaccines- Shingrix  Completed   HPV VACCINES  Aged Out    Chart Review Today: I personally reviewed active problem list, medication list, allergies, family history, social history, health maintenance, notes from last encounter, lab results, imaging with the patient/caregiver today.     Review of Systems  Constitutional: Negative.   HENT: Negative.    Eyes: Negative.   Respiratory: Negative.    Cardiovascular: Negative.   Gastrointestinal: Negative.   Endocrine: Negative.   Genitourinary: Negative.   Musculoskeletal: Negative.   Skin: Negative.   Allergic/Immunologic: Negative.   Neurological: Negative.   Hematological: Negative.   Psychiatric/Behavioral: Negative.    All other systems reviewed and are negative.    Objective:   Vitals:   02/07/22 1332  BP: 136/84  Pulse: (!) 105  Resp: 16  Temp: 97.9 F (36.6 C)  TempSrc: Oral  SpO2: 97%  Weight: 159 lb 8 oz (72.3 kg)  Height: '5\' 7"'$  (1.702 m)    Body mass index is 24.98  kg/m.  Physical Exam Vitals and nursing note reviewed.  Constitutional:      General: He is not in acute distress.    Appearance: Normal appearance. He is well-developed. He is not ill-appearing, toxic-appearing or diaphoretic.  HENT:     Head: Normocephalic and atraumatic.     Nose: Nose normal.  Eyes:     General: No scleral icterus.       Right eye: No discharge.        Left eye: No discharge.     Conjunctiva/sclera: Conjunctivae normal.  Neck:     Trachea: No tracheal deviation.  Cardiovascular:     Rate and Rhythm: Normal rate and regular rhythm.     Pulses: Normal pulses.     Heart sounds: Normal heart sounds.  Pulmonary:     Effort: Pulmonary effort is normal. No respiratory distress.     Breath sounds: Normal breath sounds. No stridor.  Musculoskeletal:        General: Normal range of motion.  Skin:    General: Skin is warm and dry.     Coloration: Skin is not jaundiced.     Findings: No bruising or rash.  Neurological:     Mental Status: He is alert.     Motor: No abnormal muscle tone.     Coordination: Coordination normal.     Gait: Gait abnormal (walking with cane).     Comments: Mild right hemiparesis- improving compared to last OV  Psychiatric:        Mood and Affect: Mood normal.        Behavior: Behavior normal.         Assessment & Plan:   Problem List Items Addressed This Visit       Cardiovascular and Mediastinum   Hypertension associated with type 2 diabetes mellitus ([place]    On amlodipine 5 mg,having high readings when in severe pain with PT Would like to prevent BP from  going higher than 130/80's on average BP Readings from Last 3 Encounters:  02/07/22 136/84  12/15/21 (!) 135/91  12/07/21 123/85  Will keep meds the same for now, monitor resting bp occasionally, we will want to adjust meds if it goes higher Reassured that BP should physiologically increase with stress/pain/exercise - this is ok as long as he is not having any concerning  sx, like dizziness, HA, CP, SOB       Relevant Orders   COMPLETE METABOLIC PANEL WITH GFR     Digestive   GERD (gastroesophageal reflux disease)    Sx stable        Endocrine   Hyperlipidemia associated with type 2 diabetes mellitus ([place]) - Primary    Statin myopathy, on repatha per cardiology Lipids done in July - at goal, no need to recheck until next year or per cardiology preference Lab Results  Component Value Date   CHOL 112 10/06/2021   HDL 47 10/06/2021   LDLCALC 44 10/06/2021   TRIG 105 10/06/2021   CHOLHDL 2.4 10/06/2021       Relevant Orders   COMPLETE METABOLIC PANEL WITH GFR   Onychomycosis of multiple toenails with type 2 diabetes mellitus ([place])    Nails very long and curling today, multiple thickened Refer to podiatry per pt request He has not let family assist him with any nail care      Type 2 diabetes mellitus without complication, without long-term current use of insulin ([facility])    Last A1C 7.1 but near goal Metformin dose was increased quickly to 1000 mg BID and pt had GI SE  We decreased the dose and he is due for recheck DM foot exam done today Due for DM eye exam and screening microalbumin/scr ratio On repatha and norvasc (not statin and ACE/ARB)      Relevant Orders   Hemoglobin A1c   Microalbumin / creatinine urine ratio   COMPLETE METABOLIC PANEL WITH GFR     Musculoskeletal and Integument   Statin myopathy     Other   Personal history of prostate cancer   Relevant Orders   PSA   Hemiparesis, aphasia, and dysphagia as late effects of stroke ([place])   Lower urinary tract symptoms (LUTS)    Sx developed in the hospital setting On flomax Recheck PSA to ensure it should still be undetectable      Relevant Orders   PSA   H/O prostatectomy   Relevant Orders   PSA   Other Visit Diagnoses     Encounter for medication monitoring       Relevant Orders   Hemoglobin A1c   Microalbumin / creatinine urine ratio   COMPLETE METABOLIC  PANEL WITH GFR   CBC with Differential/Platelet   Need for influenza vaccination       Encounter for long-term (current) use of antiplatelets/antithrombotics       Relevant Orders   COMPLETE METABOLIC PANEL WITH GFR   CBC with Differential/Platelet   Acute pain of right shoulder       Relevant Orders   Ambulatory referral to Orthopedics   Chronic left shoulder pain       Relevant Orders   Ambulatory referral to Orthopedics       Amy - OT ask about PT for shoulder  Podiatry nails  Referrals - to call pt's daughter in law [place]  Return in about 6 months (around 08/08/2022) for Routine follow-up.   [name], PA-C 02/07/22 1:49 PM

## 2022-02-06 NOTE — Assessment & Plan Note (Signed)
Statin myopathy, on repatha per cardiology Lipids done in July - at goal, no need to recheck until next year or per cardiology preference Lab Results  Component Value Date   CHOL 112 10/06/2021   HDL 47 10/06/2021   LDLCALC 44 10/06/2021   TRIG 105 10/06/2021   CHOLHDL 2.4 10/06/2021

## 2022-02-07 ENCOUNTER — Ambulatory Visit (INDEPENDENT_AMBULATORY_CARE_PROVIDER_SITE_OTHER): Payer: Medicare Other | Admitting: Family Medicine

## 2022-02-07 ENCOUNTER — Encounter: Payer: Self-pay | Admitting: Family Medicine

## 2022-02-07 VITALS — BP 136/84 | HR 105 | Temp 97.9°F | Resp 16 | Ht 67.0 in | Wt 159.5 lb

## 2022-02-07 DIAGNOSIS — I69391 Dysphagia following cerebral infarction: Secondary | ICD-10-CM

## 2022-02-07 DIAGNOSIS — Z23 Encounter for immunization: Secondary | ICD-10-CM

## 2022-02-07 DIAGNOSIS — M25512 Pain in left shoulder: Secondary | ICD-10-CM

## 2022-02-07 DIAGNOSIS — E1169 Type 2 diabetes mellitus with other specified complication: Secondary | ICD-10-CM | POA: Diagnosis not present

## 2022-02-07 DIAGNOSIS — G720 Drug-induced myopathy: Secondary | ICD-10-CM

## 2022-02-07 DIAGNOSIS — K219 Gastro-esophageal reflux disease without esophagitis: Secondary | ICD-10-CM | POA: Diagnosis not present

## 2022-02-07 DIAGNOSIS — M25511 Pain in right shoulder: Secondary | ICD-10-CM

## 2022-02-07 DIAGNOSIS — I69359 Hemiplegia and hemiparesis following cerebral infarction affecting unspecified side: Secondary | ICD-10-CM

## 2022-02-07 DIAGNOSIS — B351 Tinea unguium: Secondary | ICD-10-CM

## 2022-02-07 DIAGNOSIS — Z5181 Encounter for therapeutic drug level monitoring: Secondary | ICD-10-CM

## 2022-02-07 DIAGNOSIS — Z8546 Personal history of malignant neoplasm of prostate: Secondary | ICD-10-CM

## 2022-02-07 DIAGNOSIS — T466X5A Adverse effect of antihyperlipidemic and antiarteriosclerotic drugs, initial encounter: Secondary | ICD-10-CM

## 2022-02-07 DIAGNOSIS — I69320 Aphasia following cerebral infarction: Secondary | ICD-10-CM

## 2022-02-07 DIAGNOSIS — E1159 Type 2 diabetes mellitus with other circulatory complications: Secondary | ICD-10-CM

## 2022-02-07 DIAGNOSIS — Z9079 Acquired absence of other genital organ(s): Secondary | ICD-10-CM

## 2022-02-07 DIAGNOSIS — R399 Unspecified symptoms and signs involving the genitourinary system: Secondary | ICD-10-CM

## 2022-02-07 DIAGNOSIS — E119 Type 2 diabetes mellitus without complications: Secondary | ICD-10-CM

## 2022-02-07 DIAGNOSIS — I152 Hypertension secondary to endocrine disorders: Secondary | ICD-10-CM

## 2022-02-07 DIAGNOSIS — E785 Hyperlipidemia, unspecified: Secondary | ICD-10-CM

## 2022-02-07 DIAGNOSIS — Z7902 Long term (current) use of antithrombotics/antiplatelets: Secondary | ICD-10-CM

## 2022-02-07 DIAGNOSIS — G8929 Other chronic pain: Secondary | ICD-10-CM

## 2022-02-07 NOTE — Assessment & Plan Note (Signed)
Sx stable

## 2022-02-07 NOTE — Assessment & Plan Note (Signed)
Sx developed in the hospital setting On flomax Recheck PSA to ensure it should still be undetectable

## 2022-02-07 NOTE — Assessment & Plan Note (Signed)
Nails very long and curling today, multiple thickened Refer to podiatry per pt request He has not let family assist him with any nail care

## 2022-02-08 ENCOUNTER — Ambulatory Visit: Payer: Medicare Other | Admitting: Speech Pathology

## 2022-02-08 ENCOUNTER — Ambulatory Visit: Payer: Medicare Other

## 2022-02-08 ENCOUNTER — Other Ambulatory Visit: Payer: Self-pay | Admitting: Family Medicine

## 2022-02-08 DIAGNOSIS — R269 Unspecified abnormalities of gait and mobility: Secondary | ICD-10-CM

## 2022-02-08 DIAGNOSIS — R482 Apraxia: Secondary | ICD-10-CM | POA: Diagnosis not present

## 2022-02-08 DIAGNOSIS — M25511 Pain in right shoulder: Secondary | ICD-10-CM

## 2022-02-08 DIAGNOSIS — R41841 Cognitive communication deficit: Secondary | ICD-10-CM

## 2022-02-08 DIAGNOSIS — R2681 Unsteadiness on feet: Secondary | ICD-10-CM

## 2022-02-08 DIAGNOSIS — R278 Other lack of coordination: Secondary | ICD-10-CM

## 2022-02-08 DIAGNOSIS — G8929 Other chronic pain: Secondary | ICD-10-CM

## 2022-02-08 DIAGNOSIS — R1312 Dysphagia, oropharyngeal phase: Secondary | ICD-10-CM

## 2022-02-08 DIAGNOSIS — I63512 Cerebral infarction due to unspecified occlusion or stenosis of left middle cerebral artery: Secondary | ICD-10-CM

## 2022-02-08 DIAGNOSIS — M6281 Muscle weakness (generalized): Secondary | ICD-10-CM

## 2022-02-08 DIAGNOSIS — R262 Difficulty in walking, not elsewhere classified: Secondary | ICD-10-CM

## 2022-02-08 LAB — CBC WITH DIFFERENTIAL/PLATELET
Absolute Monocytes: 570 cells/uL (ref 200–950)
Basophils Absolute: 89 cells/uL (ref 0–200)
Basophils Relative: 1.2 %
Eosinophils Absolute: 133 cells/uL (ref 15–500)
Eosinophils Relative: 1.8 %
HCT: 43.8 % (ref 38.5–50.0)
Hemoglobin: 15.3 g/dL (ref 13.2–17.1)
Lymphs Abs: 2227 cells/uL (ref 850–3900)
MCH: 31.5 pg (ref 27.0–33.0)
MCHC: 34.9 g/dL (ref 32.0–36.0)
MCV: 90.3 fL (ref 80.0–100.0)
MPV: 9.6 fL (ref 7.5–12.5)
Monocytes Relative: 7.7 %
Neutro Abs: 4381 cells/uL (ref 1500–7800)
Neutrophils Relative %: 59.2 %
Platelets: 254 10*3/uL (ref 140–400)
RBC: 4.85 10*6/uL (ref 4.20–5.80)
RDW: 12.3 % (ref 11.0–15.0)
Total Lymphocyte: 30.1 %
WBC: 7.4 10*3/uL (ref 3.8–10.8)

## 2022-02-08 LAB — COMPLETE METABOLIC PANEL WITH GFR
AG Ratio: 1.8 (calc) (ref 1.0–2.5)
ALT: 15 U/L (ref 9–46)
AST: 14 U/L (ref 10–35)
Albumin: 4.5 g/dL (ref 3.6–5.1)
Alkaline phosphatase (APISO): 53 U/L (ref 35–144)
BUN: 13 mg/dL (ref 7–25)
CO2: 29 mmol/L (ref 20–32)
Calcium: 9.6 mg/dL (ref 8.6–10.3)
Chloride: 103 mmol/L (ref 98–110)
Creat: 0.77 mg/dL (ref 0.70–1.28)
Globulin: 2.5 g/dL (calc) (ref 1.9–3.7)
Glucose, Bld: 123 mg/dL — ABNORMAL HIGH (ref 65–99)
Potassium: 4.7 mmol/L (ref 3.5–5.3)
Sodium: 140 mmol/L (ref 135–146)
Total Bilirubin: 0.7 mg/dL (ref 0.2–1.2)
Total Protein: 7 g/dL (ref 6.1–8.1)
eGFR: 92 mL/min/{1.73_m2} (ref >=60)

## 2022-02-08 LAB — HEMOGLOBIN A1C
Hgb A1c MFr Bld: 6.8 % of total Hgb — ABNORMAL HIGH (ref <5.7)
Mean Plasma Glucose: 148 mg/dL
eAG (mmol/L): 8.2 mmol/L

## 2022-02-08 LAB — PSA: PSA: <0.04 ng/mL (ref <=4.00)

## 2022-02-08 MED ORDER — TRAMADOL HCL 50 MG PO TABS: ORAL_TABLET | ORAL | 0 refills | Status: DC

## 2022-02-08 NOTE — Therapy (Signed)
OUTPATIENT PHYSICAL THERAPY NEURO TREATMENT NOTE   Patient Name: Gerald Bauer MRN: [id number] DOB:[birth year], 76 y.o., male Today's Date: 02/08/2022   PCP: [name], PA-C REFERRING PROVIDER: Bary Leriche, PA-C   PT End of Session - 02/08/22 1021     Visit Number 17    Number of Visits 24    Date for PT Re-Evaluation 02/16/22    Progress Note Due on Visit 10    PT Start Time 1016    PT Stop Time 1100    PT Time Calculation (min) 44 min    Equipment Utilized During Treatment Gait belt    Activity Tolerance Patient tolerated treatment well    Behavior During Therapy WFL for tasks assessed/performed              Past Medical History:  Diagnosis Date   Acid reflux    Arthritis    hands   Carotid atherosclerosis    Diabetes mellitus without complication ([place])    Ganglion cyst 02/23/2015   Hyperlipemia    Hypertension    Joint pain in fingers of right hand 02/23/2015   Dominant hand   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 07/11/2015   Prostate cancer ([place])    history   Stroke (cerebrum) ([place]) 10/06/2021   Tachycardia    Tinea pedis of both feet 01/10/2017   Past Surgical History:  Procedure Laterality Date   [place]  2013   PROSTATECTOMY  2012   Patient Active Problem List   Diagnosis Date Noted   H/O prostatectomy 02/06/2022   Hemiparesis, aphasia, and dysphagia as late effects of stroke ([place]) 11/07/2021   Lower urinary tract symptoms (LUTS) 11/07/2021   Myalgia due to statin 02/21/2021   Type 2 diabetes mellitus without complication, without long-term current use of insulin ([place]) 02/21/2021   Aortic atherosclerosis ([place]) 04/27/2020   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   Onychomycosis of multiple toenails with type 2 diabetes mellitus ([place]) 07/19/2016   Carotid stenosis 03/13/2016   Hyperlipidemia associated with type 2 diabetes mellitus ([place])    Hypertension  associated with type 2 diabetes mellitus ([place])    Personal history of prostate cancer    Carotid atherosclerosis    GERD (gastroesophageal reflux disease) 08/20/2013    ONSET DATE: 10/05/21  REFERRING DIAG: G86.761 (ICD-10-CM) - Cerebral infarction due to unspecified occlusion or stenosis of left middle cerebral artery  THERAPY DIAG:  Difficulty in walking, not elsewhere classified  Muscle weakness (generalized)  Abnormality of gait and mobility  Unsteadiness on feet  Other lack of coordination  Left middle cerebral artery stroke (HCC)  Dysphagia, oropharyngeal phase  Cognitive communication deficit  Rationale for Evaluation and Treatment Rehabilitation  SUBJECTIVE:  SUBJECTIVE STATEMENT:  Pt reports 4-5/10 pain in the R arm today.  MD visit yesterday went well and sh eis not concerned about BP being high following exercises and from pain.     Pt accompanied by: self and brother who is in waiting area.  PAIN today:  Are you having pain? Yes: NPRS scale: 1/10 Pain location: bil shoulders, achey/dull, sharp  PERTINENT HISTORY:Per H and P from 7/17 hospital d/c Pt  is a 76 year old male with history of left-sided carotid stenosis, non-insulin-dependent diabetes mellitus type 2, PAD, GERD, mild COPD, history of tobacco use, hyperlipidemia, hypertension, who presents emergency department for chief concerns of facial droop, expressive aphasia, and R sided weakness  PRECAUTIONS: Fall  WEIGHT BEARING RESTRICTIONS No  FALLS: Has patient fallen in last 6 months? Yes. Number of falls 1  LIVING ENVIRONMENT:  Lives with: lives with their family and lived alone prior to onset  Lives in: House/apartment Stairs: Yes: External: 4 steps; can reach both Has following equipment at home: Walker - 2  wheeled and shower chair  PLOF: Independent  PATIENT GOALS become independent with everyday activities like he was prior to the onset of the stroke   OBJECTIVE:   DIAGNOSTIC FINDINGS: 1. 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 No associated hemorrhage or significant regional mass effect.  2. Loss of normal flow voids throughout the left ICA and MCA,  consistent with slow flow and/or occlusion. Susceptibility artifact  within proximal left MCA branches consistent with intraluminal  thrombus.  3. Underlying mild chronic microvascular ischemic disease. Tiny  remote left ACA distribution infarct.     MRA HEAD IMPRESSION:   "1. Interval near complete occlusion of the left ICA and MCA,  presumably acute in nature given the presence of the acute left MCA  territory infarct. Left A1 segment not well seen either, also likely  occluded.  2. Otherwise wide patency of the major intracranial arterial  circulation. No other hemodynamically significant or correctable  stenosis.     MRA NECK IMPRESSION:  1. 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 Some attenuated flow seen  distally within the cervical left ICA, with subsequent reocclusion  by the skull base.  2. 50% atheromatous stenosis at the origin of the cervical right  ICA. Otherwise wide patency of the right carotid artery system.  3. Wide patency of the vertebral arteries within the neck.".    COGNITION: Overall cognitive status: Within functional limits for tasks assessed and subjective history limited secondary to aphasia but able to answer 1 word questions appropriately.    SENSATION: WFL  COORDINATION: WNL with heel to shin testing    LOWER EXTREMITY ROM:   AROM WNL    LOWER EXTREMITY MMT:    MMT Right Eval Left Eval  Hip flexion 4 4+  Hip extension    Hip abduction 4 5  Hip adduction 4 5  Hip internal rotation    Hip external rotation    Knee  flexion 4 5  Knee extension 4 5  Ankle dorsiflexion 4+ 5  Ankle plantarflexion 4+ 5  Ankle inversion 5 5  Ankle eversion 5 5  (Blank rows = not tested)   TRANSFERS: Assistive device utilized: Environmental consultant - 2 wheeled  Sit to stand: Modified independence Stand to sit: Complete Independence Chair to chair: SBA  STAIRS:  Level of Assistance: SBA Stair Negotiation Technique: Step to Pattern with Bilateral Rails  Number of Stairs: 4  Height of Stairs: 6 in  Comments: Right lower extremity foot catches when a sending steps and patient performs descending steps utilizing right lower extremity for more support.  Patient reports he knows he is doing this "the hard way"  GAIT: Gait pattern: decreased arm swing- Right and decreased step length- Right Distance walked: 10 M Assistive device utilized: Environmental consultant - 2 wheeled and None Level of assistance: Modified independence Comments: difficulty with turns and object navigation per DGI tesitng, able to walk without AD but only in controlled environment without obstacles or significant challenge  FUNCTIONAL TESTs:  5 times sit to stand: 16.95 sec no UE assist 10 meter walk test: .76 m/s without AD  Berg Balance Scale: 44/56 DGI:10  Progress Note 10/10: 5x STS: 13sec no UE support DGI:14/24 BERG: 50/56 10 meter walk test: .09 m/s without AD (normal speed)   PATIENT SURVEYS:  FOTO to retest next session, likely with either asking patient questions or having caregiver complete patient completed but had 97% indicating he could run and jump with these and when questioned about this following patient reports he cannot run or jump with these and would benefit from filling this out again next time.  12/04/2021: 49 (goal 62) 01/02/2022: 92 (goal 62) 01/23/2022: 56 (goal 62)    TODAY'S TREATMENT:   Gait belt donned, CGA provided unless noted otherwise  NMR:  Seated BP prior to session 137/84 mmHg  HR 92 bpm  Ambulation within the the  hospital on carpeted surfaces, tile, all flat surfaces, continuous walking for 10 minutes without any necessary rest breaks.  Ambulation outside across varying surfaces including grass, pavement, wood, pine needles, and pavers with continuous walking for 20 minutes without any necessary rest breaks.  Static stance on incline board at first setting, eyes open, 30 sec bouts Static stance on incline board at first setting, eyes closed, 30 sec bouts Static stance on incline board at second setting, eyes open, 30 sec bouts Static stance on incline board at second setting, eyes closed, 30 sec bouts  BOSU ball weight shifts left/right, x10 each direction BOSU ball weight shifts forward/backward, x10 each direction BOSU ball weight shifts clockwise/counter-clockwise, x10 each direction BOSU ball mini squats, x10 each     PATIENT EDUCATION: Education details: Pt educated throughout session about proper posture and technique with exercises. Improved exercise technique, movement at target joints, use of target muscles after min to mod verbal, visual, tactile cues.   Person educated: Patient Education method: Explanation, Demonstration, Tactile cues, and Verbal cues Education comprehension: verbalized understanding, returned demonstration, verbal cues required, tactile cues required, and needs further education   HOME EXERCISE PROGRAM:  No updates today, pt to continue as previously given  9/11: Pt to continue HEP as previously given. However, PT did provide education to use a chair with arm-rests for lateral support when performing STS  10/3: added standing hip ABD/ext with UE support on counter 10/26: updated as below  Access Code: [email or web address] URL: [email or web address] Date: 01/18/2022 Prepared by: [place]  Exercises - Prone Hip Extension  - 1 x daily - 7 x weekly - 3 sets - 10 reps - Standing Hip Abduction with Resistance at Ankles and Counter Support  - 1 x daily - 7 x  weekly - 3 sets - 10 reps - Standing Hip Extension with Resistance at Ankles and Counter Support  - 1 x daily - 7 x weekly - 3 sets - 10 reps - Side Stepping with Resistance  at Ankles and Counter Support  - 1 x daily - 7 x weekly - 3 sets - 10 reps - Forward and Backward Monster Walk with Resistance at Ankles and Counter Support  - 1 x daily - 7 x weekly - 3 sets - 10 reps - Forward Monster Walk with Resistance at [employer] and Counter Support  - 1 x daily - 7 x weekly - 3 sets - 10 reps    GOALS: Goals reviewed with patient? Yes  SHORT TERM GOALS: Target date: 12/22/2021   Patient will be independent in home exercise program to improve strength/mobility for better functional independence with ADLs. Baseline: No HEP currently, (10/10) not compliantGoal status: INITIAL   LONG TERM GOALS: Target date: 02/16/2022  1.  Patient (> 14 years old) will complete five times sit to stand test in < 15 seconds indicating an increased LE strength and improved balance. Baseline: 16.95 01/02/22: 13 sec Goal status: MET  2.  Patient will increase FOTO score to equal to or greater than  60   to demonstrate statistically significant improvement in mobility and quality of life.  Baseline: Test next session 01/02/22: 92 Goal status: MET   3.  Patient will increase Berg Balance score by > 6 points to demonstrate decreased fall risk during functional activities. Baseline: 44 01/02/22: 50/56 Goal status: MET   4.  Patient will improve DGI by 5 points or more to reduce fall risk and demonstrate improved gait ability. Baseline: 10 01/02/22: 14 Goal status: PROGRESSING  5.  Patient will increase 10 meter walk test to >1.14ms as to improve gait speed for better community ambulation and to reduce fall risk. Baseline: .750m 01/02/22: 0.9 m/s Goal status: PROGRESSING  6.  Patient will increase Berg Balance score by > 6 points to demonstrate decreased fall risk during functional activities. Baseline:  44 01/02/22: 50/56 Goal status: PROGRESSING  7. Patient will increase FGA score by > 4 points to demonstrate decreased fall risk during functional activities. Baseline: 18/30 Goal status: NEW   ASSESSMENT:  CLINICAL IMPRESSION:   Pt has continues to make good progress towards goals and continues to improve overall functional mobility as therapy progresses.  Pt able to demonstrate increased stability with the balance exercises given to him and is able to continue with longer ambulation without need for rest breaks.  Pt to continue with current plan and improve overall balance in order to return to PLOF.    OBJECTIVE IMPAIRMENTS Abnormal gait, cardiopulmonary status limiting activity, decreased activity tolerance, decreased balance, decreased endurance, decreased mobility, difficulty walking, decreased strength, and decreased safety awareness.   ACTIVITY LIMITATIONS lifting, bending, standing, squatting, stairs, transfers, and locomotion level  PARTICIPATION LIMITATIONS: meal prep, shopping, and community activity  PERSONAL FACTORS Age and 3+ comorbidities: left-sided carotid stenosis, non-insulin-dependent diabetes mellitus type 2, PAD, GERD, mild COPD, history of tobacco use, hyperlipidemia, hypertension  are also affecting patient's functional outcome.   REHAB POTENTIAL: Good  CLINICAL DECISION MAKING: Evolving/moderate complexity  EVALUATION COMPLEXITY: Moderate   PLAN:  PT FREQUENCY: 2x/week  PT DURATION: 12 weeks  PLANNED INTERVENTIONS: Therapeutic exercises, Therapeutic activity, Neuromuscular re-education, Balance training, Gait training, Patient/Family education, Self Care, Joint mobilization, Stair training, Moist heat, and Manual therapy  PLAN FOR NEXT SESSION:   Continue with functional/dynamic balance interventions and increased endurance activities.   JoGwenlyn SaranPT, DPT Physical Therapist- [facility]11/16/23, 10:22  AM

## 2022-02-08 NOTE — Therapy (Signed)
OUTPATIENT SPEECH LANGUAGE PATHOLOGY TREATMENT NOTE   Patient Name: Gerald Bauer MRN: [id number] DOB:04/24/1945, 76 y.o., male Today's Date: 02/08/2022  PCP: [name], PA-C REFERRING PROVIDER: Reesa Chew, PA-C   END OF SESSION:   End of Session - 02/08/22 1107     Visit Number 24    Number of Visits 45    Date for SLP Re-Evaluation 04/25/22    SLP Start Time 1103    SLP Stop Time  1200    SLP Time Calculation (min) 57 min    Activity Tolerance Patient tolerated treatment well             Past Medical History:  Diagnosis Date   Acid reflux    Arthritis    hands   Carotid atherosclerosis    Diabetes mellitus without complication ([place])    Ganglion cyst 02/23/2015   Hyperlipemia    Hypertension    Joint pain in fingers of right hand 02/23/2015   Dominant hand   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 07/11/2015   Prostate cancer ([place])    history   Stroke (cerebrum) ([place]) 10/06/2021   Tachycardia    Tinea pedis of both feet 01/10/2017   Past Surgical History:  Procedure Laterality Date   [place]  2013   PROSTATECTOMY  2012   Patient Active Problem List   Diagnosis Date Noted   H/O prostatectomy 02/06/2022   Hemiparesis, aphasia, and dysphagia as late effects of stroke ([place]) 11/07/2021   Lower urinary tract symptoms (LUTS) 11/07/2021   Myalgia due to statin 02/21/2021   Type 2 diabetes mellitus without complication, without long-term current use of insulin ([place]) 02/21/2021   Aortic atherosclerosis ([place]) 04/27/2020   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   Onychomycosis of multiple toenails with type 2 diabetes mellitus ([place]) 07/19/2016   Carotid stenosis 03/13/2016   Hyperlipidemia associated with type 2 diabetes mellitus ([place])    Hypertension associated with type 2 diabetes mellitus ([place])    Personal history of prostate cancer    Carotid atherosclerosis    GERD  (gastroesophageal reflux disease) 08/20/2013    ONSET DATE: 10/05/2021    REFERRING DIAG: Cerebral infarction due to unspecified occlusion of left middle cerebral artery  THERAPY DIAG:  Verbal apraxia Verbal apraxia  Rationale for Evaluation and Treatment Rehabilitation  SUBJECTIVE STATEMENT:             "I can make a phone call with a script" (mod cues to slow rate)   Pt accompanied by: daughter in law [name]  PERTINENT HISTORY: Pt  is a 76 year old male with history of left-sided carotid stenosis, non-insulin-dependent diabetes mellitus type 2, PAD, GERD, mild COPD, history of tobacco use, hyperlipidemia, hypertension, who presented 10/05/21 to [facility] ED with facial droop, expressive aphasia, and R sided weakness and found to have left MCA CVA. He was admitted to [facility] 7/17-10/31/21. Advanced to dysphagia 3/thin liquids by cup by time of d/c from CIR. 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 Difficult to fully assess cognition given severity of linguistic impairment; however, does present with decreased safety awareness, diminished problem-solving, emergent awareness, and mild impulsivity with movement."     DIAGNOSTIC FINDINGS: MBS 10/19/21: "Pt presents with overall mild oropharyngeal dysphagia. Oral phase c/b piecemeal deglutition, decreased mastication, decreased bolus cohesion, premature spillage, and weak lingual manipulation. Pharyngeal phase  c/b reduced pharyngeal peristalsis + reduced BOT approximation to PPW, which results in mild to moderate vallecular and pyriform sinuses residuals (vallecula > pyriform sinuses for solids, pyriform sinuses > vallecula for liquids). No penetration nor aspiration appreciated with thin liquid via cup, Dysphagia 1 (puree), Dysphagia 2 (chopped), Dysphagia 3 (mechanical soft), or with 13 mm Barium tablet placed in puree. Once instance of sensed aspiration with  intake of large bolus of thin liquid via straw due to swallow initiation delay to pyriform sinuses and mistiming of swallow-breath cycle; despite strong reflexive cough aspirant was not fully cleared from trachea. Pt's level of swallow initiation was noted to vary with liquid consumption (over base of epiglottis and at pyriform sinuses) with level of swallow initiation for solids to be consistent at the vallecula. Pharyngeal stasis was partially cleared with reflexive secondary swallow." MRI brain 10/05/21: 1. 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 No associated hemorrhage or significant regional mass effect. 2. Loss of normal flow voids throughout the left ICA and MCA, consistent with slow flow and/or occlusion. 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 3. Underlying mild chronic microvascular ischemic disease. Tiny remote left ACA distribution infarct.  PAIN:  Are you having pain? No  PATIENT GOALS talk better   OBJECTIVE:    TODAY'S TREATMENT:  Patient reports practicing scripts at home; he states he used with PT today and was understood. During scripted intro conversation with SLP, pt independently retrieved his script for reference when he began to struggle with apraxia. When script training today, pt required 4-5 repetitions to attain intelligibility of 80%; audio recording was helpful to increase awareness of suboptimal volume and increasing rate. Pt initiated repair when breakdowns occurred during scripted activities 75% of opportunities. When asked if he would like to generate additional scripts, pt relayed (with supported conversation) he would like to make a phone call to his friend [place] Generated additional script in pt's words for home practice. Facilitated conversation (limited SLP knowledge of context) with multimodal tools (writing, gestures, keywords) re: pt's previous jobs.    PATIENT  EDUCATION: Education details: see today's treatment for details Person educated: Patient Education method: Explanation, Demonstration, and written supports Education comprehension: verbalized understanding and needs further education     GOALS: Goals reviewed with patient? Yes   SHORT TERM GOALS: Target date: 10 sessions   Patient will participate in clinical assessment of swallow function with goals added as needed. Baseline: Goal status: MET   2.  Pt will communicate emergency information 100% accuracy using visual aid if necessary.  Baseline:  Goal status: MET   3.  Pt will approximate personally relevant words and phrases >80% accuracy using script training and min visual cues for apraxia.   Baseline:  Goal status: MET   4.  Pt will generate at least 4 descriptors of target word 80% of the time using semantic feature analysis to improve abilities in wordfinding and resolving communication breakdowns.  Baseline:  Goal status: MET   5.  Pt will complete HEP for dysphagia with rare min A. Baseline:  Goal status: MET   6.  Pt will generate sentences using personally relevant words list for >80% intelligibility. Baseline: 50% intelligibility Goal status: MET   7.  Pt will improve expressive language skills by taking 4-6 turns in simple conversation, with supported conversation strategies if needed. Baseline:  Goal status: MET 8.  Pt will generate sentences moderately complex sentences 8-10 words >80% intelligibility, with self-correction/multimodal  communication allowed. Baseline: 50% intelligibility Goal status: INITIAL   9.  Patient will use intelligibility strategies and script training for 4-6 turn telephone exchange with family member or friend. Baseline:  Goal status: INITIAL     LONG TERM GOALS: Target date: 04/25/2022    Pt will engage in 5-8 minutes simple-mod complex conversation re: topic of interest with supported conversation, aphasia compensations.   Baseline: 01/25/22: 5 turn exchange. Goal renewed 01/25/22 Goal status: IN PROGRESS   2.  Pt will ID and attempt repair of communication breakdowns >90% of the time in session.    Baseline: 01/25/22: requires occasional mod cues to initiate correction; renewed 01/25/22 Goal status: IN PROGRESS   3.  Pt/family will demonstrate knowledge of community resources and activities to support language/communication. Baseline: 01/25/22: referral completed to TAP; pt has yet to register for group, renewed 01/25/22 Goal status: IN PROGRESS   4.  Pt will demonstrate use of swallowing precautions independently with s/sx aspiration <5% of trials. Baseline: 01/25/22: no overt s/sx when sipping water in sessions, pt and family report no issues at home with meals Goal status: DEFERRED      ASSESSMENT:   CLINICAL IMPRESSION: Patient presents with moderate Broca's aphasia, moderate verbal apraxia, and mild dysarthria. With encouragement, extended time and mod cues, pt able to repair of breakdowns using multimodal means. Pt now initiating use of strategies to slow rate and improve accuracy of verbal output. Continues to improve error awareness and attempts at self-correction. Shows promise with script training to improve intelligibility for short interactions with family/community. Pt often attempts to generate sentence level responses in conversation, but requires cues to provide key words initially for context, and usual cues to slow rate at this level of discourse. I recommend skilled ST to improve pt's language and motor speech skills to improve communication and reduce frustration.    OBJECTIVE IMPAIRMENTS include expressive language, receptive language, aphasia, apraxia, dysarthria, and dysphagia. These impairments are limiting patient from managing medications, managing appointments, managing finances, household responsibilities, ADLs/IADLs, effectively communicating at home and in community, and safety when  swallowing. Factors affecting potential to achieve goals and functional outcome are cooperation/participation level; pt appears highly motivated with good family support. Patient will benefit from skilled SLP services to address above impairments and improve overall function.   REHAB POTENTIAL: Excellent   PLAN: SLP FREQUENCY: 2x/week   SLP DURATION: 12 weeks   PLANNED INTERVENTIONS: Aspiration precaution training, Pharyngeal strengthening exercises, Diet toleration management , Language facilitation, Environmental controls, Trials of upgraded texture/liquids, Cognitive reorganization, Internal/external aids, Functional tasks, Multimodal communication approach, SLP instruction and feedback, Compensatory strategies, and Patient/family education   [name], MS, CCC-SLP Speech-Language Pathologist [phone]   [name], [place] 02/08/2022, 12:31 PM  [facility] [address] [place], [place], [address] Phone: [phone]   Fax:  [phone]

## 2022-02-13 ENCOUNTER — Ambulatory Visit: Payer: Medicare Other | Admitting: Speech Pathology

## 2022-02-13 ENCOUNTER — Ambulatory Visit: Payer: Medicare Other

## 2022-02-13 ENCOUNTER — Ambulatory Visit: Payer: Medicare Other | Admitting: Occupational Therapy

## 2022-02-13 DIAGNOSIS — R2681 Unsteadiness on feet: Secondary | ICD-10-CM

## 2022-02-13 DIAGNOSIS — R262 Difficulty in walking, not elsewhere classified: Secondary | ICD-10-CM

## 2022-02-13 DIAGNOSIS — R482 Apraxia: Secondary | ICD-10-CM

## 2022-02-13 DIAGNOSIS — R278 Other lack of coordination: Secondary | ICD-10-CM

## 2022-02-13 DIAGNOSIS — R269 Unspecified abnormalities of gait and mobility: Secondary | ICD-10-CM

## 2022-02-13 DIAGNOSIS — R41841 Cognitive communication deficit: Secondary | ICD-10-CM

## 2022-02-13 DIAGNOSIS — I63512 Cerebral infarction due to unspecified occlusion or stenosis of left middle cerebral artery: Secondary | ICD-10-CM

## 2022-02-13 DIAGNOSIS — M6281 Muscle weakness (generalized): Secondary | ICD-10-CM

## 2022-02-13 DIAGNOSIS — R4701 Aphasia: Secondary | ICD-10-CM

## 2022-02-13 NOTE — Therapy (Signed)
OUTPATIENT SPEECH LANGUAGE PATHOLOGY TREATMENT NOTE   Patient Name: Gerald Bauer MRN: [id number] DOB:24-Apr-1945, 76 y.o., male Today's Date: 02/13/2022  PCP: [name], PA-C Referring Provider:  Bary Leriche, PA-C  END OF SESSION:   End of Session - 02/13/22 1307     Visit Number 25    Number of Visits 45    Date for SLP Re-Evaluation 04/25/22    SLP Start Time 57    SLP Stop Time  1200    SLP Time Calculation (min) 60 min    Activity Tolerance Patient tolerated treatment well             Past Medical History:  Diagnosis Date   Acid reflux    Arthritis    hands   Carotid atherosclerosis    Diabetes mellitus without complication ([place])    Ganglion cyst 02/23/2015   Hyperlipemia    Hypertension    Joint pain in fingers of right hand 02/23/2015   Dominant hand   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 07/11/2015   Prostate cancer ([place])    history   Stroke (cerebrum) ([place]) 10/06/2021   Tachycardia    Tinea pedis of both feet 01/10/2017   Past Surgical History:  Procedure Laterality Date   [place]  2013   PROSTATECTOMY  2012   Patient Active Problem List   Diagnosis Date Noted   H/O prostatectomy 02/06/2022   Hemiparesis, aphasia, and dysphagia as late effects of stroke ([place]) 11/07/2021   Lower urinary tract symptoms (LUTS) 11/07/2021   Myalgia due to statin 02/21/2021   Type 2 diabetes mellitus without complication, without long-term current use of insulin ([place]) 02/21/2021   Aortic atherosclerosis ([place]) 04/27/2020   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   Onychomycosis of multiple toenails with type 2 diabetes mellitus ([place]) 07/19/2016   Carotid stenosis 03/13/2016   Hyperlipidemia associated with type 2 diabetes mellitus ([place])    Hypertension associated with type 2 diabetes mellitus ([place])    Personal history of prostate cancer    Carotid atherosclerosis    GERD  (gastroesophageal reflux disease) 08/20/2013    ONSET DATE: 10/05/2021    REFERRING DIAG: Cerebral infarction due to unspecified occlusion of left middle cerebral artery   THERAPY DIAG:  Verbal apraxia Verbal apraxia   Rationale for Evaluation and Treatment Rehabilitation   SUBJECTIVE STATEMENT:             "My speech is better" as reported in relation to last visit with this writer, SLP a few weeks ago).   Pt accompanied by: daughter in Physicist, medical   PERTINENT HISTORY: Pt  is a 76 year old male with history of left-sided carotid stenosis, non-insulin-dependent diabetes mellitus type 2, PAD, GERD, mild COPD, history of tobacco use, hyperlipidemia, hypertension, who presented 10/05/21 to [facility] ED with facial droop, expressive aphasia, and R sided weakness and found to have left MCA CVA. He was admitted to [facility] 7/17-10/31/21. Advanced to dysphagia 3/thin liquids by cup by time of d/c from CIR. 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 Difficult to fully assess cognition given severity of linguistic impairment; however, does present with decreased safety awareness, diminished problem-solving, emergent awareness, and mild impulsivity with movement."     DIAGNOSTIC FINDINGS: MBS 10/19/21: "Pt presents with overall mild oropharyngeal dysphagia. Oral phase c/b piecemeal deglutition, decreased mastication, decreased  bolus cohesion, premature spillage, and weak lingual manipulation. Pharyngeal phase c/b reduced pharyngeal peristalsis + reduced BOT approximation to PPW, which results in mild to moderate vallecular and pyriform sinuses residuals (vallecula > pyriform sinuses for solids, pyriform sinuses > vallecula for liquids). No penetration nor aspiration appreciated with thin liquid via cup, Dysphagia 1 (puree), Dysphagia 2 (chopped), Dysphagia 3 (mechanical soft), or with 13 mm Barium tablet placed in puree.  Once instance of sensed aspiration with intake of large bolus of thin liquid via straw due to swallow initiation delay to pyriform sinuses and mistiming of swallow-breath cycle; despite strong reflexive cough aspirant was not fully cleared from trachea. Pt's level of swallow initiation was noted to vary with liquid consumption (over base of epiglottis and at pyriform sinuses) with level of swallow initiation for solids to be consistent at the vallecula. Pharyngeal stasis was partially cleared with reflexive secondary swallow." MRI brain 10/05/21: 1. 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 No associated hemorrhage or significant regional mass effect. 2. Loss of normal flow voids throughout the left ICA and MCA, consistent with slow flow and/or occlusion. 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 3. Underlying mild chronic microvascular ischemic disease. Tiny remote left ACA distribution infarct.   PAIN:  Are you having pain? Yes, shoulder, team aware   PATIENT GOALS talk better   OBJECTIVE:    TODAY'S TREATMENT:  Intervention targeted improved verbal expression with assistance of script development and practice for increased speech/language at functional life event (holiday, thanksgiving plans with family). Patient generated x2 topic scripts with total of x10 sentences (>4 words each) for holiday/family conversation with moderate verbal and visual assist for initial expression of sentence. When script training today, pt required reduced frequency of repetitions to attain intelligibility of 80%; was 4-5 reps now reduced to 2-3 reps. Awareness of suboptimal volume and increasing rate with self initiation of correction was 50%.  Facilitated conversation (limited SLP knowledge of context) with multimodal tools (writing, gestures, keywords) re: pt's holiday traditions and family activities. Patient independently attempted  previously trained "tap it out" strategy though only ~25% successful sentence production with partner understanding. Most beneficial to patient was 5 dots for structured finger tapping tactile/visual cue after which patient was 75% successful in x8 complete sentence productions with 1-3 self directed attempts and SLP prompting to initiate strategy usage.    PATIENT EDUCATION: Education details: see today's treatment for details Person educated: Patient Education method: Explanation, Demonstration, and written supports Education comprehension: verbalized understanding and needs further education     GOALS: Goals reviewed with patient? Yes   SHORT TERM GOALS: Target date: 10 sessions   Patient will participate in clinical assessment of swallow function with goals added as needed. Baseline: Goal status: MET   2.  Pt will communicate emergency information 100% accuracy using visual aid if necessary.  Baseline:  Goal status: MET   3.  Pt will approximate personally relevant words and phrases >80% accuracy using script training and min visual cues for apraxia.   Baseline:  Goal status: MET   4.  Pt will generate at least 4 descriptors of target word 80% of the time using semantic feature analysis to improve abilities in wordfinding and resolving communication breakdowns.  Baseline:  Goal status: MET   5.  Pt will complete HEP for dysphagia with rare min A. Baseline:  Goal status: MET   6.  Pt will generate sentences using personally relevant words list for >  80% intelligibility. Baseline: 50% intelligibility Goal status: MET   7.  Pt will improve expressive language skills by taking 4-6 turns in simple conversation, with supported conversation strategies if needed. Baseline:  Goal status: MET 8.  Pt will generate sentences moderately complex sentences 8-10 words >80% intelligibility, with self-correction/multimodal communication allowed. Baseline: 50% intelligibility Goal status:  INITIAL   9.  Patient will use intelligibility strategies and script training for 4-6 turn telephone exchange with family member or friend. Baseline:  Goal status: INITIAL     LONG TERM GOALS: Target date: 04/25/2022    Pt will engage in 5-8 minutes simple-mod complex conversation re: topic of interest with supported conversation, aphasia compensations.  Baseline: 01/25/22: 5 turn exchange. Goal renewed 01/25/22 Goal status: IN PROGRESS   2.  Pt will ID and attempt repair of communication breakdowns >90% of the time in session.    Baseline: 01/25/22: requires occasional mod cues to initiate correction; renewed 01/25/22 Goal status: IN PROGRESS   3.  Pt/family will demonstrate knowledge of community resources and activities to support language/communication. Baseline: 01/25/22: referral completed to TAP; pt has yet to register for group, renewed 01/25/22 Goal status: IN PROGRESS   4.  Pt will demonstrate use of swallowing precautions independently with s/sx aspiration <5% of trials. Baseline: 01/25/22: no overt s/sx when sipping water in sessions, pt and family report no issues at home with meals Goal status: DEFERRED       ASSESSMENT:   CLINICAL IMPRESSION: Patient presents with moderate Broca's aphasia, moderate verbal apraxia, and mild dysarthria. With encouragement, extended time and mod level cues, pt able to repair of breakdowns using multimodal means. Pt now initiating use of strategies to slow rate and improve accuracy of verbal output. Continues to improve error awareness and attempts at self-correction. Shows promise with script training to improve intelligibility for short interactions with family/community. Pt often attempts to generate sentence level responses in conversation, but requires cues to provide key words initially for context, and usual cues to slow rate at this level of discourse. I recommend skilled ST to improve pt's language and motor speech skills to improve  communication and reduce frustration.    OBJECTIVE IMPAIRMENTS include expressive language, receptive language, aphasia, apraxia, dysarthria, and dysphagia. These impairments are limiting patient from managing medications, managing appointments, managing finances, household responsibilities, ADLs/IADLs, effectively communicating at home and in community, and safety when swallowing. Factors affecting potential to achieve goals and functional outcome are cooperation/participation level; pt appears highly motivated with good family support. Patient will benefit from skilled SLP services to address above impairments and improve overall function.   REHAB POTENTIAL: Excellent   PLAN: SLP FREQUENCY: 2x/week   SLP DURATION: 12 weeks   PLANNED INTERVENTIONS: Aspiration precaution training, Pharyngeal strengthening exercises, Diet toleration management , Language facilitation, Environmental controls, Trials of upgraded texture/liquids, Cognitive reorganization, Internal/external aids, Functional tasks, Multimodal communication approach, SLP instruction and feedback, Compensatory strategies, and Patient/family education  [place]ania L. Boandl, M.A. [place] [phone]   [place], [place] 02/13/2022, 1:08 PM   [place] [facility] SERVICES [address] [place], [place], [address] Phone: [phone]   Fax:  [phone]

## 2022-02-13 NOTE — Therapy (Signed)
OUTPATIENT PHYSICAL THERAPY NEURO TREATMENT NOTE   Patient Name: Gerald Bauer MRN: [id number] DOB:01-15-1946, 76 y.o., male Today's Date: 02/13/2022   PCP: [name], PA-C REFERRING PROVIDER: Bary Leriche, PA-C   PT End of Session - 02/13/22 0935     Visit Number 18    Number of Visits 24    Date for PT Re-Evaluation 02/16/22    Progress Note Due on Visit 10    PT Start Time 0932    PT Stop Time 1015    PT Time Calculation (min) 43 min    Equipment Utilized During Treatment Gait belt    Activity Tolerance Patient tolerated treatment well    Behavior During Therapy WFL for tasks assessed/performed               Past Medical History:  Diagnosis Date   Acid reflux    Arthritis    hands   Carotid atherosclerosis    Diabetes mellitus without complication ([place])    Ganglion cyst 02/23/2015   Hyperlipemia    Hypertension    Joint pain in fingers of right hand 02/23/2015   Dominant hand   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 07/11/2015   Prostate cancer ([place])    history   Stroke (cerebrum) ([place]) 10/06/2021   Tachycardia    Tinea pedis of both feet 01/10/2017   Past Surgical History:  Procedure Laterality Date   [place]  2013   PROSTATECTOMY  2012   Patient Active Problem List   Diagnosis Date Noted   H/O prostatectomy 02/06/2022   Hemiparesis, aphasia, and dysphagia as late effects of stroke ([place]) 11/07/2021   Lower urinary tract symptoms (LUTS) 11/07/2021   Myalgia due to statin 02/21/2021   Type 2 diabetes mellitus without complication, without long-term current use of insulin ([place]) 02/21/2021   Aortic atherosclerosis ([place]) 04/27/2020   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   Onychomycosis of multiple toenails with type 2 diabetes mellitus ([place]) 07/19/2016   Carotid stenosis 03/13/2016   Hyperlipidemia associated with type 2 diabetes mellitus ([place])    Hypertension  associated with type 2 diabetes mellitus ([place])    Personal history of prostate cancer    Carotid atherosclerosis    GERD (gastroesophageal reflux disease) 08/20/2013    ONSET DATE: 10/05/21  REFERRING DIAG: J44.920 (ICD-10-CM) - Cerebral infarction due to unspecified occlusion or stenosis of left middle cerebral artery  THERAPY DIAG:  Difficulty in walking, not elsewhere classified  Muscle weakness (generalized)  Verbal apraxia  Abnormality of gait and mobility  Unsteadiness on feet  Other lack of coordination  Left middle cerebral artery stroke [facility])  Rationale for Evaluation and Treatment Rehabilitation  SUBJECTIVE:  SUBJECTIVE STATEMENT:  Pt reports nothing new today.  Pt denies any falls recently.  Pt reports 5/10 pain in R arm today.   Pt accompanied by: self and brother who is in waiting area.  PAIN today:  Are you having pain? Yes: NPRS scale: 5/10 Pain location: bil shoulders, achey/dull, sharp  PERTINENT HISTORY:Per H and P from 7/17 hospital d/c Pt  is a 76 year old male with history of left-sided carotid stenosis, non-insulin-dependent diabetes mellitus type 2, PAD, GERD, mild COPD, history of tobacco use, hyperlipidemia, hypertension, who presents emergency department for chief concerns of facial droop, expressive aphasia, and R sided weakness  PRECAUTIONS: Fall  WEIGHT BEARING RESTRICTIONS No  FALLS: Has patient fallen in last 6 months? Yes. Number of falls 1  LIVING ENVIRONMENT:  Lives with: lives with their family and lived alone prior to onset  Lives in: House/apartment Stairs: Yes: External: 4 steps; can reach both Has following equipment at home: Walker - 2 wheeled and shower chair  PLOF: Independent  PATIENT GOALS become independent with everyday activities  like he was prior to the onset of the stroke   OBJECTIVE:   DIAGNOSTIC FINDINGS: 1. 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 No associated hemorrhage or significant regional mass effect.  2. Loss of normal flow voids throughout the left ICA and MCA,  consistent with slow flow and/or occlusion. Susceptibility artifact  within proximal left MCA branches consistent with intraluminal  thrombus.  3. Underlying mild chronic microvascular ischemic disease. Tiny  remote left ACA distribution infarct.     MRA HEAD IMPRESSION:   "1. Interval near complete occlusion of the left ICA and MCA,  presumably acute in nature given the presence of the acute left MCA  territory infarct. Left A1 segment not well seen either, also likely  occluded.  2. Otherwise wide patency of the major intracranial arterial  circulation. No other hemodynamically significant or correctable  stenosis.     MRA NECK IMPRESSION:  1. 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 Some attenuated flow seen  distally within the cervical left ICA, with subsequent reocclusion  by the skull base.  2. 50% atheromatous stenosis at the origin of the cervical right  ICA. Otherwise wide patency of the right carotid artery system.  3. Wide patency of the vertebral arteries within the neck.".    COGNITION: Overall cognitive status: Within functional limits for tasks assessed and subjective history limited secondary to aphasia but able to answer 1 word questions appropriately.    SENSATION: WFL  COORDINATION: WNL with heel to shin testing    LOWER EXTREMITY ROM:   AROM WNL    LOWER EXTREMITY MMT:    MMT Right Eval Left Eval  Hip flexion 4 4+  Hip extension    Hip abduction 4 5  Hip adduction 4 5  Hip internal rotation    Hip external rotation    Knee flexion 4 5  Knee extension 4 5  Ankle dorsiflexion 4+ 5  Ankle plantarflexion 4+ 5  Ankle inversion 5  5  Ankle eversion 5 5  (Blank rows = not tested)   TRANSFERS: Assistive device utilized: Environmental consultant - 2 wheeled  Sit to stand: Modified independence Stand to sit: Complete Independence Chair to chair: SBA  STAIRS:  Level of Assistance: SBA Stair Negotiation Technique: Step to Pattern with Bilateral Rails Number of Stairs: 4  Height of Stairs: 6 in  Comments: Right lower extremity foot catches when a sending steps and patient performs descending steps utilizing right lower extremity for more support.  Patient reports he knows he is doing this "the hard way"  GAIT: Gait pattern: decreased arm swing- Right and decreased step length- Right Distance walked: 10 M Assistive device utilized: Environmental consultant - 2 wheeled and None Level of assistance: Modified independence Comments: difficulty with turns and object navigation per DGI tesitng, able to walk without AD but only in controlled environment without obstacles or significant challenge  FUNCTIONAL TESTs:  5 times sit to stand: 16.95 sec no UE assist 10 meter walk test: .76 m/s without AD  Berg Balance Scale: 44/56 DGI:10  Progress Note 10/10: 5x STS: 13sec no UE support DGI:14/24 BERG: 50/56 10 meter walk test: .09 m/s without AD (normal speed)   PATIENT SURVEYS:  FOTO to retest next session, likely with either asking patient questions or having caregiver complete patient completed but had 97% indicating he could run and jump with these and when questioned about this following patient reports he cannot run or jump with these and would benefit from filling this out again next time.  12/04/2021: 49 (goal 62) 01/02/2022: 92 (goal 62) 01/23/2022: 56 (goal 62)    TODAY'S TREATMENT:   Gait belt donned, CGA provided unless noted otherwise  NMR:  Seated BP prior to session 137/84 mmHg  HR 92 bpm  Ambulation in hallway with tandem walking and finger to nose (sobriety walk), length of hallway x2  Dynamic balloon taps with DIL, while  standing on Airex balance beam, 4 minutes  Dynamic boxing at speed bag while standing on airex balance beam, 2 minutes Star excursion while tapping hedgehogs, 4x each LE   TherEx:  Seated on big blue physioball, LAQ, marches, deadbugs, 2x10 each LE Leg Press BLE 85#, x10 Single Leg Press, 40#, x10 each LE, much more difficult on the R LE     PATIENT EDUCATION: Education details: Pt educated throughout session about proper posture and technique with exercises. Improved exercise technique, movement at target joints, use of target muscles after min to mod verbal, visual, tactile cues.   Person educated: Patient Education method: Explanation, Demonstration, Tactile cues, and Verbal cues Education comprehension: verbalized understanding, returned demonstration, verbal cues required, tactile cues required, and needs further education   HOME EXERCISE PROGRAM:  No updates today, pt to continue as previously given  9/11: Pt to continue HEP as previously given. However, PT did provide education to use a chair with arm-rests for lateral support when performing STS  10/3: added standing hip ABD/ext with UE support on counter 10/26: updated as below  Access Code: [email or web address] URL: [email or web address] Date: 01/18/2022 Prepared by: [place]  Exercises - Prone Hip Extension  - 1 x daily - 7 x weekly - 3 sets - 10 reps - Standing Hip Abduction with Resistance at Ankles and Counter Support  - 1 x daily - 7 x weekly - 3 sets - 10 reps - Standing Hip Extension with Resistance at Ankles and Counter Support  - 1 x daily - 7 x weekly - 3 sets - 10 reps - Side Stepping with Resistance at Ankles and Counter Support  - 1 x daily - 7 x weekly - 3 sets - 10 reps - Forward and Backward Monster Walk with Resistance at Ankles and Counter Support  - 1 x daily - 7 x weekly - 3 sets - 10 reps - [employer] Walk with Resistance at [employer] and  Counter Support  - 1 x daily - 7 x weekly - 3 sets  - 10 reps    GOALS: Goals reviewed with patient? Yes  SHORT TERM GOALS: Target date: 12/22/2021   Patient will be independent in home exercise program to improve strength/mobility for better functional independence with ADLs. Baseline: No HEP currently, (10/10) not compliantGoal status: INITIAL   LONG TERM GOALS: Target date: 02/16/2022  1.  Patient (> 40 years old) will complete five times sit to stand test in < 15 seconds indicating an increased LE strength and improved balance. Baseline: 16.95 01/02/22: 13 sec Goal status: MET  2.  Patient will increase FOTO score to equal to or greater than  60   to demonstrate statistically significant improvement in mobility and quality of life.  Baseline: Test next session 01/02/22: 92 Goal status: MET   3.  Patient will increase Berg Balance score by > 6 points to demonstrate decreased fall risk during functional activities. Baseline: 44 01/02/22: 50/56 Goal status: MET   4.  Patient will improve DGI by 5 points or more to reduce fall risk and demonstrate improved gait ability. Baseline: 10 01/02/22: 14 Goal status: PROGRESSING  5.  Patient will increase 10 meter walk test to >1.36ms as to improve gait speed for better community ambulation and to reduce fall risk. Baseline: .745m 01/02/22: 0.9 m/s Goal status: PROGRESSING  6.  Patient will increase Berg Balance score by > 6 points to demonstrate decreased fall risk during functional activities. Baseline: 44 01/02/22: 50/56 Goal status: PROGRESSING  7. Patient will increase FGA score by > 4 points to demonstrate decreased fall risk during functional activities. Baseline: 18/30 Goal status: NEW   ASSESSMENT:  CLINICAL IMPRESSION:   Pt performed well with newly introduced exercises and continues to put forth great effort.  Pt is making significant progress towards goals and is able to tolerate increased stability exercises with good success.  Pt did have some stability  problems with sitting on physioball which is why CGA and minA was utilized at times to prevent pt from rolling off the physioball.  Pt however is making the necessary adjustments to improve his stability even on an unpredictable surface.    OBJECTIVE IMPAIRMENTS Abnormal gait, cardiopulmonary status limiting activity, decreased activity tolerance, decreased balance, decreased endurance, decreased mobility, difficulty walking, decreased strength, and decreased safety awareness.   ACTIVITY LIMITATIONS lifting, bending, standing, squatting, stairs, transfers, and locomotion level  PARTICIPATION LIMITATIONS: meal prep, shopping, and community activity  PERSONAL FACTORS Age and 3+ comorbidities: left-sided carotid stenosis, non-insulin-dependent diabetes mellitus type 2, PAD, GERD, mild COPD, history of tobacco use, hyperlipidemia, hypertension  are also affecting patient's functional outcome.   REHAB POTENTIAL: Good  CLINICAL DECISION MAKING: Evolving/moderate complexity  EVALUATION COMPLEXITY: Moderate   PLAN:  PT FREQUENCY: 2x/week  PT DURATION: 12 weeks  PLANNED INTERVENTIONS: Therapeutic exercises, Therapeutic activity, Neuromuscular re-education, Balance training, Gait training, Patient/Family education, Self Care, Joint mobilization, Stair training, Moist heat, and Manual therapy  PLAN FOR NEXT SESSION:   Continue with functional/dynamic balance interventions and increased endurance activities.   JoGwenlyn SaranPT, DPT Physical Therapist- CoWhid[facility] Hospital11/21/23, 9:36 AM

## 2022-02-14 ENCOUNTER — Encounter: Payer: Self-pay | Admitting: Occupational Therapy

## 2022-02-14 NOTE — Therapy (Signed)
OUTPATIENT OCCUPATIONAL THERAPY NEURO TREATMENT Patient Name: Gerald Bauer MRN: [id number] DOB:[birth year], 76 y.o., male Today's Date: 01/30/2022  PCP: Threasa Alpha, PA REFERRING PROVIDER: Reesa Chew    OT End of Session - 02/14/22 1147     Visit Number 23    Number of Visits 31    Date for OT Re-Evaluation 04/17/22    OT Start Time 59    OT Stop Time 1100    OT Time Calculation (min) 44 min    Activity Tolerance Patient tolerated treatment well    Behavior During Therapy [facility] for tasks assessed/performed                    Past Medical History:  Diagnosis Date   Acid reflux    Arthritis    hands   Carotid atherosclerosis    Diabetes mellitus without complication ([place])    Ganglion cyst 02/23/2015   Hyperlipemia    Hypertension    Joint pain in fingers of right hand 02/23/2015   Dominant hand   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 07/11/2015   Prostate cancer ([place])    history   Stroke (cerebrum) ([place]) 10/06/2021   Tachycardia    Tinea pedis of both feet 01/10/2017   Past Surgical History:  Procedure Laterality Date   [place]  2013   PROSTATECTOMY  2012   Patient Active Problem List   Diagnosis Date Noted   H/O prostatectomy 02/06/2022   Hemiparesis, aphasia, and dysphagia as late effects of stroke ([place]) 11/07/2021   Lower urinary tract symptoms (LUTS) 11/07/2021   Myalgia due to statin 02/21/2021   Type 2 diabetes mellitus without complication, without long-term current use of insulin ([place]) 02/21/2021   Aortic atherosclerosis ([place]) 04/27/2020   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   Onychomycosis of multiple toenails with type 2 diabetes mellitus ([place]) 07/19/2016   Carotid stenosis 03/13/2016   Hyperlipidemia associated with type 2 diabetes mellitus ([place])    Hypertension associated with type 2 diabetes mellitus ([place])    Personal history of prostate cancer    Carotid  atherosclerosis    GERD (gastroesophageal reflux disease) 08/20/2013    ONSET DATE: 10/05/21  REFERRING DIAG: L MCA CVA  THERAPY DIAG:  Muscle weakness (generalized)  Other lack of coordination  Unsteadiness on feet  Rationale for Evaluation and Treatment Rehabilitation  SUBJECTIVE:    SUBJECTIVE STATEMENT:   Pt reports 5/10 pain in right shoulder at rest, has seen an overall decline in pain over time but still present.    PERTINENT HISTORY: Per chart, Gerald Bauer is a 76 year old right-handed male with history of hypertension, hyperlipidemia, diabetes mellitus, prostate cancer/prostatectomy 2012, quit smoking 24 years ago.  Presented to [facility] 10/05/2021 with right side weakness/facial droop and expressive aphasia.  Blood pressure 155/102.  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  No associated hemorrhage or significant mass effect.    PAIN:  Are you having pain?  3/10 R shoulder; heat, rest, gentle stretch affective for pain management FALLS: Has patient fallen in last 6 months? Yes. Number of falls 3 since CVA  PLOF: Independent/retired Dealer  PATIENT GOALS : Pt points to his hand (wants to be able to use his hand)  OBJECTIVE:   HAND DOMINANCE: Right   FUNCTIONAL OUTCOME MEASURES: FOTO: 48  UUPPER EXTREMITY ROM      Active ROM Right eval  Left Eval WNL Right 12/11/21 Right 01/23/2022  Shoulder flexion 90 (135)   95 (110) 95  Shoulder abduction 95 (110)   80 (90) 95  Wrist flex     54 60  Wrist ext     41 45    UPPER EXTREMITY MMT:      MMT Right eval Left Eval 5/5 Right 12/11/21  Shoulder flexion 3-   -3  Shoulder abduction 3-   -3  Shoulder adduction        Shoulder extension        Shoulder internal rotation 3-      Shoulder external rotation 3-      Middle trapezius        Lower trapezius        Elbow flexion 4-   4  Elbow extension 4+   5  Wrist flexion 4   NT d/t pain  Wrist extension 4-   NT  d/t pain  (Blank rows = not tested)   HAND FUNCTION: Eval:         Grip strength: Right: 14 lbs; Left: 58 lbs, Lateral pinch: Right: 7 lbs, Left: 19 lbs, and 3 point pinch: Right: 2 lbs, Left: 16 lbs 10th visit: Grip strength: Right: 16  lbs;                                Lateral pinch: Right: 9  lbs;                            3 point pinch: Right: 5 lbs 20th visit: Grip strength: Right: 20 lbs; Left: 70 lbs, Lateral pinch: Right: 10 lbs, Left: 20 lbs, and 3 point pinch: Right: 9 lbs, Left: 20 lbs    Eval: COORDINATION: Finger Nose Finger test: difficult/lacks precision with reaching targets 9 Hole Peg test: Right: unable sec; Left: 27 sec Able to oppose 2nd and 3rd digits to thumb on R hand   10th visit 9 hole Peg test: Right: 11 min, 44 sec   20th visit:  9 hole Peg test: Right: 3 min 6 sec    TODAY'S TREATMENT:  Manual therapy:  Patient seen in sidelying for scapular mobilization, for elevation, retraction, and upward rotation by therapist, followed by active assistive range of motion for the same movement patterns. Joint mobilizations to posterior and inferior joint capsule at shoulder, grade II to decrease pain and increase motion.    Therapeutic Exercise: Following manual therapy, patient was seen for passive range of motion of right shoulder, shoulder flexion to 95 degrees, abduction to 90, elbow, forearm, wrist, and hand.  Focused on passive range of motion for finger extension with prolonged stretching.  Facilitation of right shoulder flexion to 90 of motion with place and hold  for 10 repetitions. Advanced to place and hold with external perturbations for 10 repetitions, followed by small, circular motions in both directions with arm maintained at 90 of flexion, then performing crosses/ plus signs with arm. Therapist assisting as needed with exercises to support at elbow to maintain position.  Range of motion with medium therapy ball for shoulder flexion to 90, 10 repetitions,  chest press, and alternate shoulder taps performed in supine, therapist cues and assist as needed for proper form, technique, and pacing of exercises.  Pt instructed to incorporate speech into task with counting of repetitions out loud. In sitting, pt performing reaching  task with use of Shape tower for first 2 levels to place items with graduated height to encourage reach of right UE.   Neuromuscular Reeducation:  Pt seen for manipulation of medium pegs with use of large Judy board, picking up and placing 4 rows.  Once all pegs were in place, pt focused on turning and flipping of pegs from end to end with effort and cues for isolated finger movements to complete task.        PATIENT EDUCATION: Education details: HEP progression, Engineer, production Person educated: pt Education method: Explanation and Verbal cues, handout Education comprehension: verbalized understanding, returned demo   HOME EXERCISE PROGRAM: Theraputty, hand writing skills, table slides  GOALS: Goals reviewed with patient? Yes   SHORT TERM GOALS: Target date: /23   Pt will be indep to perform HEP for increasing strength and coordination throughout RUE.  Baseline: Not yet initiated; 10th: putty given but pt reports limited use, instructed in table slides and self PROM for R wrist/digits. 20th:  Pt performing exercises but requires continual upgrades and instruction  Goal status: ongoing   2.  Pt will manage clothing fasteners with extra time, using RUE as an assist  Baseline: unable; pt has elastic laces and wears elastic waisted pants; 10th: pt uses R hand as an assist to zip and button jeans with extra time; not yet tried small buttons on a shirt.  Pt continues to use elastic laces on shoes. 20th:  Improving with manipulation skills but still using elastic laces, able to button pants and shirt with increased time and effort.  Goal status: ongoing   LONG TERM GOALS: Target date: /23   Pt will increase FOTO score to  55 or better to indicate improved performance with daily tasks.  Baseline: 48; 10th: 48; 20th: score:42  Goal status: ongoing   2.  Pt will increase R grip strength by 10 or more lbs to enable pt to hold and carry light ADL supplies without dropping.  Baseline: R grip 14#, L 58#; 10th visit: R grip 16; 20th visit: 20# Goal status: ongoing   3.  Pt will increase RUE strength to be able to engage RUE into ADLs at least 50% of the time. Baseline: Pt using L non-dominant arm to manage ADLs; 10th visit: Son reports he constantly sees pt attempt to use his R arm for daily tasks, but continues to be limited and still must use LUE for most tasks (see chart for RUE MMT) 20th:  continues to improve on strength and is working on consistency of use  Goal status: ongoing   4.  Pt will complete 9 hole peg test on the R in 3 min or less to work towards ability to use R hand to pick up small ADL supplies.  Baseline: Pt can remove a peg but can not pick one up; 10th visit: R 9 hole completion 11 min 44 sec; 20th visit:   Pt improved to 3 mins and 6 secs, nearing goal.  Goal status: ongoing    ASSESSMENT:  CLINICAL IMPRESSION:' Pt continues to complain of shoulder pain on the right, overall decrease from 9/10 to 5/10 over recent weeks.  He responds well to manual therapy followed by ROM exercises along with facilitation of movement in supine.  Pt is utilizing hand more at home with daily tasks.  He is requiring decreased assistance from therapist with AAROM and guiding for exercises.  Pt's coordination improving to pick up and manipulate small objects about 1 to  1/2 inch in size.  Continue to work towards goals in plan of care to decrease pain, increase active ROM and improve overall functional use of right UE for necessary daily tasks at home and in the community.    PERFORMANCE DEFICITS in functional skills including ADLs, IADLs, coordination, dexterity, ROM, strength, pain, flexibility, FMC, GMC, mobility,  balance, continence, decreased knowledge of use of DME, and UE functional use, cognitive skills including safety awareness, and psychosocial skills including.   IMPAIRMENTS are limiting patient from ADLs, IADLs, leisure, and social participation.   COMORBIDITIES may have co-morbidities  that affects occupational performance. Patient will benefit from skilled OT to address above impairments and improve overall function.  MODIFICATION OR ASSISTANCE TO COMPLETE EVALUATION: Min-Moderate modification of tasks or assist with assess necessary to complete an evaluation.  OT OCCUPATIONAL PROFILE AND HISTORY: Problem focused assessment: Including review of records relating to presenting problem.  CLINICAL DECISION MAKING: Moderate - several treatment options, min-mod task modification necessary  REHAB POTENTIAL: Good  EVALUATION COMPLEXITY: Moderate    PLAN: OT FREQUENCY: 2x/week  OT DURATION: 12 weeks  PLANNED INTERVENTIONS: self care/ADL training, therapeutic exercise, therapeutic activity, neuromuscular re-education, manual therapy, passive range of motion, balance training, functional mobility training, moist heat, cryotherapy, patient/family education, cognitive remediation/compensation, energy conservation, coping strategies training, and DME and/or AE instructions  RECOMMENDED OTHER SERVICES: N/A  CONSULTED AND AGREED WITH PLAN OF CARE: Patient and family member/caregiver  PLAN FOR NEXT SESSION: HEP progression, neuro re-ed, therapeutic exercises   Chrishauna Mee T Ryota Treece, OTR/L, CLT Nyjah Denio, OT 02/14/2022, 12:11 PM

## 2022-02-15 ENCOUNTER — Other Ambulatory Visit: Payer: Self-pay | Admitting: Family Medicine

## 2022-02-15 DIAGNOSIS — I152 Hypertension secondary to endocrine disorders: Secondary | ICD-10-CM

## 2022-02-16 NOTE — Telephone Encounter (Signed)
Request - replace current RF with #[phone] for best insurance benefit- has 1 RF remaining Requested Prescriptions  Pending Prescriptions Disp Refills   amLODipine (NORVASC) 5 MG tablet [Pharmacy Med Name: amLODIPine Besylate 5 MG Oral Tablet] 100 tablet 0    Sig: TAKE 1 TABLET BY MOUTH DAILY     Cardiovascular: Calcium Channel Blockers 2 Passed - 02/15/2022  5:51 AM      Passed - Last BP in normal range    BP Readings from Last 1 Encounters:  02/07/22 136/84         Passed - Last Heart Rate in normal range    Pulse Readings from Last 1 Encounters:  02/07/22 (!) 105         Passed - Valid encounter within last 6 months    Recent Outpatient Visits           1 week ago Hyperlipidemia associated with type 2 diabetes mellitus ([place])   [facility] [name], PA-C   3 months ago Encounter for examination following treatment at hospital   [facility] [name], PA-C   5 months ago Skin cancer screening   [facility] Mecum, Erin E, PA-C   1 year ago Type 2 diabetes mellitus without complication, without long-term current use of insulin [facility])   [facility] [place], Kristeen Miss, PA-C   1 year ago Type 2 diabetes mellitus without complication, without long-term current use of insulin [facility])   [facility] [name], PA-C       Future Appointments             In 6 days Posey Pronto Thomasene Lot, DPM [facility] [place] [facility] [place], [place]   In 2 weeks Ralene Bathe, MD [place]   In 3 weeks Sharolyn Douglas, Clance Boll, NP [facility] [place]. [place]   In 5 months [name], PA-C [facility] [place], [facility]

## 2022-02-20 ENCOUNTER — Encounter: Payer: Medicare Other | Admitting: Speech Pathology

## 2022-02-20 ENCOUNTER — Ambulatory Visit: Payer: Medicare Other | Admitting: Speech Pathology

## 2022-02-20 ENCOUNTER — Ambulatory Visit: Payer: Medicare Other | Admitting: Occupational Therapy

## 2022-02-20 ENCOUNTER — Ambulatory Visit: Payer: Medicare Other

## 2022-02-20 DIAGNOSIS — R278 Other lack of coordination: Secondary | ICD-10-CM

## 2022-02-20 DIAGNOSIS — I63512 Cerebral infarction due to unspecified occlusion or stenosis of left middle cerebral artery: Secondary | ICD-10-CM

## 2022-02-20 DIAGNOSIS — R1312 Dysphagia, oropharyngeal phase: Secondary | ICD-10-CM

## 2022-02-20 DIAGNOSIS — R4701 Aphasia: Secondary | ICD-10-CM

## 2022-02-20 DIAGNOSIS — M6281 Muscle weakness (generalized): Secondary | ICD-10-CM

## 2022-02-20 DIAGNOSIS — R482 Apraxia: Secondary | ICD-10-CM | POA: Diagnosis not present

## 2022-02-20 DIAGNOSIS — R2681 Unsteadiness on feet: Secondary | ICD-10-CM

## 2022-02-20 DIAGNOSIS — R269 Unspecified abnormalities of gait and mobility: Secondary | ICD-10-CM

## 2022-02-20 DIAGNOSIS — R262 Difficulty in walking, not elsewhere classified: Secondary | ICD-10-CM

## 2022-02-20 NOTE — Therapy (Signed)
[facility] TREATMENT NOTE/DISCHARGE   Patient Name: Gerald Bauer MRN: [id number] DOB:09-30-1945, 76 y.o., male Today's Date: 02/20/2022   PCP: [name], PA-C REFERRING PROVIDER: Bary Leriche, PA-C   PT End of Session - 02/20/22 1108     Visit Number 19    Number of Visits 24    Date for PT Re-Evaluation 02/16/22    Progress Note Due on Visit 10    PT Start Time 1105    PT Stop Time 0370    PT Time Calculation (min) 40 min    Equipment Utilized During Treatment Gait belt    Activity Tolerance Patient tolerated treatment well    Behavior During Therapy WFL for tasks assessed/performed                Past Medical History:  Diagnosis Date   Acid reflux    Arthritis    hands   Carotid atherosclerosis    Diabetes mellitus without complication ([place])    Ganglion cyst 02/23/2015   Hyperlipemia    Hypertension    Joint pain in fingers of right hand 02/23/2015   Dominant hand   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 07/11/2015   Prostate cancer ([place])    history   Stroke (cerebrum) ([place]) 10/06/2021   Tachycardia    Tinea pedis of both feet 01/10/2017   Past Surgical History:  Procedure Laterality Date   [place]  2013   PROSTATECTOMY  2012   Patient Active Problem List   Diagnosis Date Noted   H/O prostatectomy 02/06/2022   Hemiparesis, aphasia, and dysphagia as late effects of stroke ([place]) 11/07/2021   Lower urinary tract symptoms (LUTS) 11/07/2021   Myalgia due to statin 02/21/2021   Type 2 diabetes mellitus without complication, without long-term current use of insulin ([place]) 02/21/2021   Aortic atherosclerosis ([place]) 04/27/2020   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   Onychomycosis of multiple toenails with type 2 diabetes mellitus ([place]) 07/19/2016   Carotid stenosis 03/13/2016   Hyperlipidemia associated with type 2 diabetes mellitus ([place])     Hypertension associated with type 2 diabetes mellitus ([place])    Personal history of prostate cancer    Carotid atherosclerosis    GERD (gastroesophageal reflux disease) 08/20/2013    ONSET DATE: 10/05/21  REFERRING DIAG: W88.891 (ICD-10-CM) - Cerebral infarction due to unspecified occlusion or stenosis of left middle cerebral artery  THERAPY DIAG:  Difficulty in walking, not elsewhere classified  Muscle weakness (generalized)  Other lack of coordination  Unsteadiness on feet  Abnormality of gait and mobility  Left middle cerebral artery stroke (HCC)  Dysphagia, oropharyngeal phase  Rationale for Evaluation and Treatment Rehabilitation  SUBJECTIVE:  SUBJECTIVE STATEMENT:  Pt reports no new complaints and that his Thanksgiving was "fine".  He does support that he had a good Thanksgiving afterwards however, just joking with DIL that is present during treatment session.      Pt accompanied by: self and DIL.  PAIN today:  Are you having pain? Yes: NPRS scale: 5/10 Pain location: bil shoulders, achey/dull, sharp  PERTINENT HISTORY:Per H and P from 7/17 hospital d/c Pt  is a 76 year old male with history of left-sided carotid stenosis, non-insulin-dependent diabetes mellitus type 2, PAD, GERD, mild COPD, history of tobacco use, hyperlipidemia, hypertension, who presents emergency department for chief concerns of facial droop, expressive aphasia, and R sided weakness  PRECAUTIONS: Fall  WEIGHT BEARING RESTRICTIONS No  FALLS: Has patient fallen in last 6 months? Yes. Number of falls 1  LIVING ENVIRONMENT:  Lives with: lives with their family and lived alone prior to onset  Lives in: House/apartment Stairs: Yes: External: 4 steps; can reach both Has following equipment at home: Walker - 2  wheeled and shower chair  PLOF: Independent  PATIENT GOALS become independent with everyday activities like he was prior to the onset of the stroke     OBJECTIVE:   DIAGNOSTIC FINDINGS: 1. 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 No associated hemorrhage or significant regional mass effect.  2. Loss of normal flow voids throughout the left ICA and MCA,  consistent with slow flow and/or occlusion. Susceptibility artifact  within proximal left MCA branches consistent with intraluminal  thrombus.  3. Underlying mild chronic microvascular ischemic disease. Tiny  remote left ACA distribution infarct.     MRA HEAD IMPRESSION:   "1. Interval near complete occlusion of the left ICA and MCA,  presumably acute in nature given the presence of the acute left MCA  territory infarct. Left A1 segment not well seen either, also likely  occluded.  2. Otherwise wide patency of the major intracranial arterial  circulation. No other hemodynamically significant or correctable  stenosis.     MRA NECK IMPRESSION:  1. 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 Some attenuated flow seen  distally within the cervical left ICA, with subsequent reocclusion  by the skull base.  2. 50% atheromatous stenosis at the origin of the cervical right  ICA. Otherwise wide patency of the right carotid artery system.  3. Wide patency of the vertebral arteries within the neck.".    COGNITION: Overall cognitive status: Within functional limits for tasks assessed and subjective history limited secondary to aphasia but able to answer 1 word questions appropriately.    SENSATION: WFL  COORDINATION: WNL with heel to shin testing    LOWER EXTREMITY ROM:   AROM WNL    LOWER EXTREMITY MMT:    MMT Right Eval Left Eval  Hip flexion 4 4+  Hip extension    Hip abduction 4 5  Hip adduction 4 5  Hip internal rotation    Hip external rotation     Knee flexion 4 5  Knee extension 4 5  Ankle dorsiflexion 4+ 5  Ankle plantarflexion 4+ 5  Ankle inversion 5 5  Ankle eversion 5 5  (Blank rows = not tested)   TRANSFERS: Assistive device utilized: Environmental consultant - 2 wheeled  Sit to stand: Modified independence Stand to sit: Complete Independence Chair to chair: SBA  STAIRS:  Level of Assistance: SBA Stair Negotiation Technique: Step to Pattern with  Bilateral Rails Number of Stairs: 4  Height of Stairs: 6 in  Comments: Right lower extremity foot catches when a sending steps and patient performs descending steps utilizing right lower extremity for more support.  Patient reports he knows he is doing this "the hard way"  GAIT: Gait pattern: decreased arm swing- Right and decreased step length- Right Distance walked: 10 M Assistive device utilized: Environmental consultant - 2 wheeled and None Level of assistance: Modified independence Comments: difficulty with turns and object navigation per DGI tesitng, able to walk without AD but only in controlled environment without obstacles or significant challenge  FUNCTIONAL TESTs:  5 times sit to stand: 16.95 sec no UE assist 10 meter walk test: .76 m/s without AD  Berg Balance Scale: 44/56 DGI:10  Progress Note 10/10: 5x STS: 13sec no UE support DGI:14/24 BERG: 50/56 10 meter walk test: .09 m/s without AD (normal speed)   PATIENT SURVEYS:  FOTO to retest next session, likely with either asking patient questions or having caregiver complete patient completed but had 97% indicating he could run and jump with these and when questioned about this following patient reports he cannot run or jump with these and would benefit from filling this out again next time.  12/04/2021: 49 (goal 62) 01/02/2022: 92 (goal 62) 01/23/2022: 56 (goal 62)    TODAY'S TREATMENT:  Goal assessment performed, gait belt donned, CGA provided unless noted otherwise  NMR:  Goal assessment performed as noted below.    PATIENT  EDUCATION: Education details: Pt educated throughout session about proper posture and technique with exercises. Improved exercise technique, movement at target joints, use of target muscles after min to mod verbal, visual, tactile cues.   Person educated: Patient Education method: Explanation, Demonstration, Tactile cues, and Verbal cues Education comprehension: verbalized understanding, returned demonstration, verbal cues required, tactile cues required, and needs further education   HOME EXERCISE PROGRAM:  No updates today, pt to continue as previously given  9/11: Pt to continue HEP as previously given. However, PT did provide education to use a chair with arm-rests for lateral support when performing STS  10/3: added standing hip ABD/ext with UE support on counter 10/26: updated as below  Access Code: [email or web address] URL: [email or web address] Date: 01/18/2022 Prepared by: Hubbell Nation  Exercises - Prone Hip Extension  - 1 x daily - 7 x weekly - 3 sets - 10 reps - Standing Hip Abduction with Resistance at Ankles and Counter Support  - 1 x daily - 7 x weekly - 3 sets - 10 reps - Standing Hip Extension with Resistance at Ankles and Counter Support  - 1 x daily - 7 x weekly - 3 sets - 10 reps - Side Stepping with Resistance at Ankles and Counter Support  - 1 x daily - 7 x weekly - 3 sets - 10 reps - Forward and Backward Monster Walk with Resistance at Ankles and Counter Support  - 1 x daily - 7 x weekly - 3 sets - 10 reps - Forward Monster Walk with Resistance at [employer] and Counter Support  - 1 x daily - 7 x weekly - 3 sets - 10 reps    GOALS: Goals reviewed with patient? Yes  SHORT TERM GOALS: Target date: 12/22/2021   Patient will be independent in home exercise program to improve strength/mobility for better functional independence with ADLs. Baseline: No HEP currently, (10/10) not compliant Goal status: MET   LONG TERM GOALS: Target date: 02/16/2022  1.  Patient (>  60 years  old) will complete five times sit to stand test in < 15 seconds indicating an increased LE strength and improved balance. Baseline: 16.95 01/02/22: 13 sec Goal status: MET  2.  Patient will increase FOTO score to equal to or greater than  60   to demonstrate statistically significant improvement in mobility and quality of life.  Baseline: Test next session 01/02/22: 92 Goal status: MET   3.  Patient will increase Berg Balance score by > 6 points to demonstrate decreased fall risk during functional activities. Baseline: 44 01/02/22: 50/56 Goal status: MET   4.  Patient will improve DGI by 5 points or more to reduce fall risk and demonstrate improved gait ability. Baseline: 10 01/02/22: 14 02/20/22: 23 Goal status: MET  5.  Patient will increase 10 meter walk test to >1.9ms as to improve gait speed for better community ambulation and to reduce fall risk. Baseline: .782m 01/02/22: 0.9 m/s 02/20/22: 1.17 m/s Goal status: MET  6.  Patient will increase Berg Balance score by > 6 points to demonstrate decreased fall risk during functional activities. Baseline: 44 01/02/22: 50/56 Goal status: MET  7. Patient will increase FGA score by > 4 points to demonstrate decreased fall risk during functional activities. Baseline: 18/30 02/20/22: 27/30 Goal status: MET   ASSESSMENT:  CLINICAL IMPRESSION:   Pt performed well with all goals assessed today as part of the re-evaluation and discharge.  Pt able to achieve all goals and has made significant progress with endurance, balance, and strength throughout the therapy process.  Pt does still have difficulty with R shoulder pain/decreased ROM which will likely be addressed soon as part of new PT referral/order by MD.  Pt is d/c at this time and advised to contact clinic if any concerns arise.  OBJECTIVE IMPAIRMENTS Abnormal gait, cardiopulmonary status limiting activity, decreased activity tolerance, decreased balance, decreased  endurance, decreased mobility, difficulty walking, decreased strength, and decreased safety awareness.   ACTIVITY LIMITATIONS lifting, bending, standing, squatting, stairs, transfers, and locomotion level  PARTICIPATION LIMITATIONS: meal prep, shopping, and community activity  PERSONAL FACTORS Age and 3+ comorbidities: left-sided carotid stenosis, non-insulin-dependent diabetes mellitus type 2, PAD, GERD, mild COPD, history of tobacco use, hyperlipidemia, hypertension  are also affecting patient's functional outcome.   REHAB POTENTIAL: Good  CLINICAL DECISION MAKING: Evolving/moderate complexity  EVALUATION COMPLEXITY: Moderate   PLAN:  PT FREQUENCY: 2x/week  PT DURATION: 12 weeks  PLANNED INTERVENTIONS: Therapeutic exercises, Therapeutic activity, Neuromuscular re-education, Balance training, Gait training, Patient/Family education, Self Care, Joint mobilization, Stair training, Moist heat, and Manual therapy  PLAN FOR NEXT SESSION:  D/C at this time.   JoGwenlyn SaranPT, DPT Physical Therapist- CoEn[facility]11/28/23, 5:25 PM

## 2022-02-20 NOTE — Therapy (Signed)
OUTPATIENT SPEECH LANGUAGE PATHOLOGY TREATMENT NOTE   Patient Name: Gerald Bauer MRN: [id number] DOB:Nov 08, 1945, 76 y.o., male Today's Date: 02/20/2022  PCP: [name], PA-C Referring Provider:  Bary Leriche, PA-C  END OF SESSION:   End of Session - 02/20/22 1526     Visit Number 26    Number of Visits 45    Date for SLP Re-Evaluation 04/25/22    SLP Start Time 0900    SLP Stop Time  1000    SLP Time Calculation (min) 60 min    Activity Tolerance Patient tolerated treatment well             Past Medical History:  Diagnosis Date   Acid reflux    Arthritis    hands   Carotid atherosclerosis    Diabetes mellitus without complication ([place])    Ganglion cyst 02/23/2015   Hyperlipemia    Hypertension    Joint pain in fingers of right hand 02/23/2015   Dominant hand   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 07/11/2015   Prostate cancer ([place])    history   Stroke (cerebrum) ([place]) 10/06/2021   Tachycardia    Tinea pedis of both feet 01/10/2017   Past Surgical History:  Procedure Laterality Date   [place]  2013   PROSTATECTOMY  2012   Patient Active Problem List   Diagnosis Date Noted   H/O prostatectomy 02/06/2022   Hemiparesis, aphasia, and dysphagia as late effects of stroke ([place]) 11/07/2021   Lower urinary tract symptoms (LUTS) 11/07/2021   Myalgia due to statin 02/21/2021   Type 2 diabetes mellitus without complication, without long-term current use of insulin ([place]) 02/21/2021   Aortic atherosclerosis ([place]) 04/27/2020   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   Onychomycosis of multiple toenails with type 2 diabetes mellitus ([place]) 07/19/2016   Carotid stenosis 03/13/2016   Hyperlipidemia associated with type 2 diabetes mellitus ([place])    Hypertension associated with type 2 diabetes mellitus ([place])    Personal history of prostate cancer    Carotid atherosclerosis    GERD  (gastroesophageal reflux disease) 08/20/2013    ONSET DATE: 10/05/2021    REFERRING DIAG: Cerebral infarction due to unspecified occlusion of left middle cerebral artery   THERAPY DIAG:  Verbal apraxia Verbal apraxia   Rationale for Evaluation and Treatment Rehabilitation   SUBJECTIVE STATEMENT:            Pt told SLP who joined him for Thanksgiving.  (80% intelligible)   Pt accompanied by: daughter in Physicist, medical   PERTINENT HISTORY: Pt  is a 76 year old male with history of left-sided carotid stenosis, non-insulin-dependent diabetes mellitus type 2, PAD, GERD, mild COPD, history of tobacco use, hyperlipidemia, hypertension, who presented [facility]/05/[facility] to [facility] ED with facial droop, expressive aphasia, and R sided weakness and found to have left MCA CVA. He was admitted to [facility] 7/17-10/31/21. Advanced to dysphagia 3/thin liquids by cup by time of d/c from CIR. 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 Difficult to fully assess cognition given severity of linguistic impairment; however, does present with decreased safety awareness, diminished problem-solving, emergent awareness, and mild impulsivity with movement."     DIAGNOSTIC FINDINGS: MBS 10/19/21: "Pt presents with overall mild oropharyngeal dysphagia. Oral phase c/b piecemeal deglutition, decreased mastication, decreased bolus cohesion, premature spillage, and weak lingual manipulation. Pharyngeal  phase c/b reduced pharyngeal peristalsis + reduced BOT approximation to PPW, which results in mild to moderate vallecular and pyriform sinuses residuals (vallecula > pyriform sinuses for solids, pyriform sinuses > vallecula for liquids). No penetration nor aspiration appreciated with thin liquid via cup, Dysphagia 1 (puree), Dysphagia 2 (chopped), Dysphagia 3 (mechanical soft), or with 13 mm Barium tablet placed in puree. Once instance of sensed aspiration  with intake of large bolus of thin liquid via straw due to swallow initiation delay to pyriform sinuses and mistiming of swallow-breath cycle; despite strong reflexive cough aspirant was not fully cleared from trachea. Pt's level of swallow initiation was noted to vary with liquid consumption (over base of epiglottis and at pyriform sinuses) with level of swallow initiation for solids to be consistent at the vallecula. Pharyngeal stasis was partially cleared with reflexive secondary swallow." MRI brain 10/05/21: 1. 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 No associated hemorrhage or significant regional mass effect. 2. Loss of normal flow voids throughout the left ICA and MCA, consistent with slow flow and/or occlusion. 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 3. Underlying mild chronic microvascular ischemic disease. Tiny remote left ACA distribution infarct.   PAIN:  Are you having pain? Yes, shoulder, 5/10   PATIENT GOALS talk better   OBJECTIVE:    TODAY'S TREATMENT:  Pt reports he did not use scripts for Thanksgiving. He has completed [employer] ([facility]) enrollment and plans to attend a virtual group. Facilitated simple conversation re: his activities over Thanksgiving holiday; he required moderate cues for slowing rate, increasing volume, and using written keywords to supplement (x3). Script training with focus on increasing volume (initial ranged 62-66 dB at 50 cm, increased to avg 70 dB with usual mod cues).   PATIENT EDUCATION: Education details: see today's treatment for details Person educated: Patient Education method: Explanation, Demonstration, and written supports Education comprehension: verbalized understanding and needs further education     GOALS: Goals reviewed with patient? Yes   SHORT TERM GOALS: Target date: 10 sessions   Patient will participate in clinical assessment of  swallow function with goals added as needed. Baseline: Goal status: MET   2.  Pt will communicate emergency information 100% accuracy using visual aid if necessary.  Baseline:  Goal status: MET   3.  Pt will approximate personally relevant words and phrases >80% accuracy using script training and min visual cues for apraxia.   Baseline:  Goal status: MET   4.  Pt will generate at least 4 descriptors of target word 80% of the time using semantic feature analysis to improve abilities in wordfinding and resolving communication breakdowns.  Baseline:  Goal status: MET   5.  Pt will complete HEP for dysphagia with rare min A. Baseline:  Goal status: MET   6.  Pt will generate sentences using personally relevant words list for >80% intelligibility. Baseline: 50% intelligibility Goal status: MET   7.  Pt will improve expressive language skills by taking 4-6 turns in simple conversation, with supported conversation strategies if needed. Baseline:  Goal status: MET 8.  Pt will generate sentences moderately complex sentences 8-10 words >80% intelligibility, with self-correction/multimodal communication allowed. Baseline: 50% intelligibility Goal status: INITIAL   9.  Patient will use intelligibility strategies and script training for 4-6 turn telephone exchange with family member or friend. Baseline:  Goal status: INITIAL     LONG TERM GOALS: Target date: 04/25/2022    Pt will engage  in 5-8 minutes simple-mod complex conversation re: topic of interest with supported conversation, aphasia compensations.  Baseline: 01/25/22: 5 turn exchange. Goal renewed 01/25/22 Goal status: IN PROGRESS   2.  Pt will ID and attempt repair of communication breakdowns >90% of the time in session.    Baseline: 01/25/22: requires occasional mod cues to initiate correction; renewed 01/25/22 Goal status: IN PROGRESS   3.  Pt/family will demonstrate knowledge of community resources and activities to support  language/communication. Baseline: 01/25/22: referral completed to [facility]; pt has yet to register for group, renewed 01/25/22 Goal status: IN PROGRESS   4.  Pt will demonstrate use of swallowing precautions independently with s/sx aspiration <5% of trials. Baseline: 01/25/22: no overt s/sx when sipping water in sessions, pt and family report no issues at home with meals Goal status: DEFERRED       ASSESSMENT:   CLINICAL IMPRESSION: Patient presents with moderate Broca's aphasia, moderate verbal apraxia, and mild dysarthria. With encouragement, extended time and mod level cues, pt able to repair of breakdowns using multimodal means. Pt now initiating use of strategies to slow rate and improve accuracy of verbal output. Continues to improve error awareness and attempts at self-correction. Shows promise with script training to improve intelligibility for short interactions with family/community. Pt often attempts to generate sentence level responses in conversation, but requires cues to provide key words initially for context, and usual cues to slow rate at this level of discourse. I recommend skilled ST to improve pt's language and motor speech skills to improve communication and reduce frustration.    OBJECTIVE IMPAIRMENTS include expressive language, receptive language, aphasia, apraxia, dysarthria, and dysphagia. These impairments are limiting patient from managing medications, managing appointments, managing finances, household responsibilities, ADLs/IADLs, effectively communicating at home and in community, and safety when swallowing. Factors affecting potential to achieve goals and functional outcome are cooperation/participation level; pt appears highly motivated with good family support. Patient will benefit from skilled SLP services to address above impairments and improve overall function.   REHAB POTENTIAL: Excellent   PLAN: SLP FREQUENCY: 2x/week   SLP DURATION: 12 weeks   PLANNED  INTERVENTIONS: Aspiration precaution training, Pharyngeal strengthening exercises, Diet toleration management , Language facilitation, Environmental controls, Trials of upgraded texture/liquids, Cognitive reorganization, Internal/external aids, Functional tasks, Multimodal communication approach, SLP instruction and feedback, Compensatory strategies, and Patient/family education  [name], MS, CCC-SLP Speech-Language Pathologist [phone]    [name], [place] 02/20/2022, 3:26 PM   [facility] [address] [place], [place], [address] Phone: [phone]   Fax:  [phone]

## 2022-02-22 ENCOUNTER — Ambulatory Visit: Payer: Medicare Other | Admitting: Podiatry

## 2022-02-22 ENCOUNTER — Ambulatory Visit: Payer: Medicare Other | Admitting: Occupational Therapy

## 2022-02-22 ENCOUNTER — Ambulatory Visit: Payer: Medicare Other | Admitting: Speech Pathology

## 2022-02-22 ENCOUNTER — Encounter: Payer: Self-pay | Admitting: Podiatry

## 2022-02-22 ENCOUNTER — Ambulatory Visit: Payer: Medicare Other

## 2022-02-22 DIAGNOSIS — R482 Apraxia: Secondary | ICD-10-CM

## 2022-02-22 DIAGNOSIS — M79675 Pain in left toe(s): Secondary | ICD-10-CM

## 2022-02-22 DIAGNOSIS — M79674 Pain in right toe(s): Secondary | ICD-10-CM

## 2022-02-22 DIAGNOSIS — M6281 Muscle weakness (generalized): Secondary | ICD-10-CM

## 2022-02-22 DIAGNOSIS — B351 Tinea unguium: Secondary | ICD-10-CM

## 2022-02-22 DIAGNOSIS — R4701 Aphasia: Secondary | ICD-10-CM

## 2022-02-22 NOTE — Therapy (Addendum)
OUTPATIENT OCCUPATIONAL THERAPY NEURO TREATMENT  Patient Name: Gerald Bauer MRN: [id number] DOB:Aug 11, 1945, 76 y.o., male Today's Date: 02/20/2022  PCP: Threasa Alpha, PA REFERRING PROVIDER: Reesa Chew    OT End of Session - 02/22/22 1330     Visit Number 25    Number of Visits 44    Date for OT Re-Evaluation 04/17/22    OT Start Time 1105    OT Stop Time 6283    OT Time Calculation (min) 40 min    Activity Tolerance Patient tolerated treatment well    Behavior During Therapy [facility] for tasks assessed/performed                    Past Medical History:  Diagnosis Date   Acid reflux    Arthritis    hands   Carotid atherosclerosis    Diabetes mellitus without complication ([place])    Ganglion cyst 02/23/2015   Hyperlipemia    Hypertension    Joint pain in fingers of right hand 02/23/2015   Dominant hand   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 07/11/2015   Prostate cancer ([place])    history   Stroke (cerebrum) ([place]) 10/06/2021   Tachycardia    Tinea pedis of both feet 01/10/2017   Past Surgical History:  Procedure Laterality Date   [place]  2013   PROSTATECTOMY  2012   Patient Active Problem List   Diagnosis Date Noted   H/O prostatectomy 02/06/2022   Hemiparesis, aphasia, and dysphagia as late effects of stroke ([place]) 11/07/2021   Lower urinary tract symptoms (LUTS) 11/07/2021   Myalgia due to statin 02/21/2021   Type 2 diabetes mellitus without complication, without long-term current use of insulin ([place]) 02/21/2021   Aortic atherosclerosis ([employer]) 04/27/2020   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   Onychomycosis of multiple toenails with type 2 diabetes mellitus ([place]) 07/19/2016   Carotid stenosis 03/13/2016   Hyperlipidemia associated with type 2 diabetes mellitus ([place])    Hypertension associated with type 2 diabetes mellitus ([place])    Personal history of prostate cancer    Carotid  atherosclerosis    GERD (gastroesophageal reflux disease) 08/20/2013    ONSET DATE: 10/05/21  REFERRING DIAG: L MCA CVA  THERAPY DIAG:  Muscle weakness (generalized)  Rationale for Evaluation and Treatment Rehabilitation  SUBJECTIVE:    SUBJECTIVE STATEMENT:   5/10 pain in right shoulder.  Pt reports he had a nice thanksgiving.  PERTINENT HISTORY: Per chart, Gerald Bauer is a 76 year old right-handed male with history of hypertension, hyperlipidemia, diabetes mellitus, prostate cancer/prostatectomy 2012, quit smoking 24 years ago.  Presented to [facility] 10/05/2021 with right side weakness/facial droop and expressive aphasia.  Blood pressure 155/102.  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  No associated hemorrhage or significant mass effect.    PAIN:  Are you having pain?  5/10 R shoulder; heat, rest, gentle stretch affective for pain management FALLS: Has patient fallen in last 6 months? Yes. Number of falls 3 since CVA  PLOF: Independent/retired Dealer  PATIENT GOALS : Pt points to his hand (wants to be able to use his hand)  OBJECTIVE:   HAND DOMINANCE: Right   FUNCTIONAL OUTCOME MEASURES: FOTO: 48  UUPPER EXTREMITY ROM      Active ROM Right eval Left Eval WNL Right 12/11/21 Right 01/23/2022  Shoulder flexion 90 (135)   95 (110) 95  Shoulder abduction 95 (110)   80 (90) 95  Wrist flex     54 60  Wrist ext     41 45    UPPER EXTREMITY MMT:      MMT Right eval Left Eval 5/5 Right 12/11/21  Shoulder flexion 3-   -3  Shoulder abduction 3-   -3  Shoulder adduction        Shoulder extension        Shoulder internal rotation 3-      Shoulder external rotation 3-      Middle trapezius        Lower trapezius        Elbow flexion 4-   4  Elbow extension 4+   5  Wrist flexion 4   NT d/t pain  Wrist extension 4-   NT d/t pain  (Blank rows = not tested)   HAND FUNCTION: Eval:         Grip strength: Right: 14 lbs;  Left: 58 lbs, Lateral pinch: Right: 7 lbs, Left: 19 lbs, and 3 point pinch: Right: 2 lbs, Left: 16 lbs 10th visit: Grip strength: Right: 16  lbs;                                Lateral pinch: Right: 9  lbs;                            3 point pinch: Right: 5 lbs 20th visit: Grip strength: Right: 20 lbs; Left: 70 lbs, Lateral pinch: Right: 10 lbs, Left: 20 lbs, and 3 point pinch: Right: 9 lbs, Left: 20 lbs    Eval: COORDINATION: Finger Nose Finger test: difficult/lacks precision with reaching targets 9 Hole Peg test: Right: unable sec; Left: 27 sec Able to oppose 2nd and 3rd digits to thumb on R hand   10th visit 9 hole Peg test: Right: 11 min, 44 sec   20th visit:  9 hole Peg test: Right: 3 min 6 sec    TODAY'S TREATMENT:    Therapeutic Exercise:   Pt. worked on [employer], and reciprocal motion using the UBE while seated for 8 min. with no resistance. Constant monitoring was provided. Pt. worked on pinch strengthening in the left hand for lateral, and 3pt. pinch using yellow, red, green, and blue resistive clips. Pt. worked on placing the clips at various vertical and horizontal angles. Tactile and verbal cues were required for eliciting the desired movement.  Pt. Worked on grasping the clips, and moving them within his hand from one pinch position to another.    neuromuscular re-education:   Pt. worked on grasping 1" resistive cubes alternating thumb opposition to the tip of the 2nd digit while the board is placed at a vertical angle. Pt. worked on pressing the cubes back into place while performing isolated 2nd digit extension.     Pt continues to progress with RUE and hand function. Pt. is improving with right hand pinch strength. Pt. requires visual cues, assist, and visual demonstration with modeling the translatory movements with the clips as he is performing it. Pt. was able to grasp the resistive cubes with his thumb, and 2nd digit, as well as isolate his 2nd digit to press them  into place. Pt. presented with more difficulty pressing the cubes in place when the board is elevated on a vertical angle off of the tabletop.  However was able to complete it easier with the board placed at a lower vertical angle. Pt. was able to continue to work towards goals in plan of care to decrease pain, increase ROM, increase strength and improve hand function for use in necessary daily tasks.        PATIENT EDUCATION: Education details: HEP progression, Engineer, production Person educated: pt Education method: Explanation and Verbal cues, handout Education comprehension: verbalized understanding, returned demo   HOME EXERCISE PROGRAM: Theraputty, hand writing skills, table slides  GOALS: Goals reviewed with patient? Yes   SHORT TERM GOALS: Target date: /23   Pt will be indep to perform HEP for increasing strength and coordination throughout RUE.  Baseline: Not yet initiated; 10th: putty given but pt reports limited use, instructed in table slides and self PROM for R wrist/digits. 20th:  Pt performing exercises but requires continual upgrades and instruction  Goal status: ongoing   2.  Pt will manage clothing fasteners with extra time, using RUE as an assist  Baseline: unable; pt has elastic laces and wears elastic waisted pants; 10th: pt uses R hand as an assist to zip and button jeans with extra time; not yet tried small buttons on a shirt.  Pt continues to use elastic laces on shoes. 20th:  Improving with manipulation skills but still using elastic laces, able to button pants and shirt with increased time and effort.  Goal status: ongoing   LONG TERM GOALS: Target date: /23   Pt will increase FOTO score to 55 or better to indicate improved performance with daily tasks.  Baseline: 48; 10th: 48; 20th: score:42  Goal status: ongoing   2.  Pt will increase R grip strength by 10 or more lbs to enable pt to hold and carry light ADL supplies without dropping.  Baseline: R grip  14#, L 58#; 10th visit: R grip 16; 20th visit: 20# Goal status: ongoing   3.  Pt will increase RUE strength to be able to engage RUE into ADLs at least 50% of the time. Baseline: Pt using L non-dominant arm to manage ADLs; 10th visit: Son reports he constantly sees pt attempt to use his R arm for daily tasks, but continues to be limited and still must use LUE for most tasks (see chart for RUE MMT) 20th:  continues to improve on strength and is working on consistency of use  Goal status: ongoing   4.  Pt will complete 9 hole peg test on the R in 3 min or less to work towards ability to use R hand to pick up small ADL supplies.  Baseline: Pt can remove a peg but can not pick one up; 10th visit: R 9 hole completion 11 min 44 sec; 20th visit:   Pt improved to 3 mins and 6 secs, nearing goal.  Goal status: ongoing    ASSESSMENT:  CLINICAL IMPRESSION:  Pt continues to progress with RUE and hand function. Pt. is improving with right hand pinch strength. Pt. requires visual cues, assist, and visual demonstration with modeling the translatory movements with the clips as he is performing it. Pt. was able to grasp the resistive cubes with his thumb, and 2nd digit, as well as isolate his 2nd digit to press them into place. Pt. presented with more difficulty pressing the cubes in place when the board is elevated on a vertical angle off of the tabletop. However was able to complete it easier with the board placed at a lower vertical angle. Pt. was  able to continue to work towards goals in plan of care to decrease pain, increase ROM, increase strength and improve hand function for use in necessary daily tasks.    PERFORMANCE DEFICITS in functional skills including ADLs, IADLs, coordination, dexterity, ROM, strength, pain, flexibility, FMC, GMC, mobility, balance, continence, decreased knowledge of use of DME, and UE functional use, cognitive skills including safety awareness, and psychosocial skills including.    IMPAIRMENTS are limiting patient from ADLs, IADLs, leisure, and social participation.   COMORBIDITIES may have co-morbidities  that affects occupational performance. Patient will benefit from skilled OT to address above impairments and improve overall function.  MODIFICATION OR ASSISTANCE TO COMPLETE EVALUATION: Min-Moderate modification of tasks or assist with assess necessary to complete an evaluation.  OT OCCUPATIONAL PROFILE AND HISTORY: Problem focused assessment: Including review of records relating to presenting problem.  CLINICAL DECISION MAKING: Moderate - several treatment options, min-mod task modification necessary  REHAB POTENTIAL: Good  EVALUATION COMPLEXITY: Moderate    PLAN: OT FREQUENCY: 2x/week  OT DURATION: 12 weeks  PLANNED INTERVENTIONS: self care/ADL training, therapeutic exercise, therapeutic activity, neuromuscular re-education, manual therapy, passive range of motion, balance training, functional mobility training, moist heat, cryotherapy, patient/family education, cognitive remediation/compensation, energy conservation, coping strategies training, and DME and/or AE instructions  RECOMMENDED OTHER SERVICES: N/A  CONSULTED AND AGREED WITH PLAN OF CARE: Patient and family member/caregiver  PLAN FOR NEXT SESSION: HEP progression, neuro re-ed, therapeutic exercises  Harrel Carina, MS, OTR/L  Harrel Carina, OT 02/22/2022, 1:46 PM

## 2022-02-22 NOTE — Therapy (Signed)
OUTPATIENT SPEECH LANGUAGE PATHOLOGY TREATMENT NOTE   Patient Name: Gerald Bauer MRN: [id number] DOB:November 03, 1945, 76 y.o., male Today's Date: 02/22/2022  PCP: [name], PA-C Referring Provider:  Bary Leriche, PA-C  END OF SESSION:   End of Session - 02/22/22 0904     Visit Number 27    Number of Visits 45    Date for SLP Re-Evaluation 04/25/22    SLP Start Time 0900    SLP Stop Time  1000    SLP Time Calculation (min) 60 min    Activity Tolerance Patient tolerated treatment well             Past Medical History:  Diagnosis Date   Acid reflux    Arthritis    hands   Carotid atherosclerosis    Diabetes mellitus without complication ([place])    Ganglion cyst 02/23/2015   Hyperlipemia    Hypertension    Joint pain in fingers of right hand 02/23/2015   Dominant hand   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 07/11/2015   Prostate cancer ([place])    history   Stroke (cerebrum) ([place]) 10/06/2021   Tachycardia    Tinea pedis of both feet 01/10/2017   Past Surgical History:  Procedure Laterality Date   [place]  2013   PROSTATECTOMY  2012   Patient Active Problem List   Diagnosis Date Noted   H/O prostatectomy 02/06/2022   Hemiparesis, aphasia, and dysphagia as late effects of stroke ([place]) 11/07/2021   Lower urinary tract symptoms (LUTS) 11/07/2021   Myalgia due to statin 02/21/2021   Type 2 diabetes mellitus without complication, without long-term current use of insulin ([place]) 02/21/2021   Aortic atherosclerosis ([place]) 04/27/2020   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   Onychomycosis of multiple toenails with type 2 diabetes mellitus ([place]) 07/19/2016   Carotid stenosis 03/13/2016   Hyperlipidemia associated with type 2 diabetes mellitus ([place])    Hypertension associated with type 2 diabetes mellitus ([place])    Personal history of prostate cancer    Carotid atherosclerosis    GERD  (gastroesophageal reflux disease) 08/20/2013    ONSET DATE: 10/05/2021    REFERRING DIAG: Cerebral infarction due to unspecified occlusion of left middle cerebral artery   THERAPY DIAG:  Verbal apraxia Verbal apraxia   Rationale for Evaluation and Treatment Rehabilitation   SUBJECTIVE STATEMENT:            Pt told SLP who joined him for Thanksgiving.  (80% intelligible)   Pt accompanied by: daughter in Physicist, medical   PERTINENT HISTORY: Pt  is a 76 year old male with history of left-sided carotid stenosis, non-insulin-dependent diabetes mellitus type 2, PAD, GERD, mild COPD, history of tobacco use, hyperlipidemia, hypertension, who presented 10/05/21 to [facility] ED with facial droop, expressive aphasia, and R sided weakness and found to have left MCA CVA. He was admitted to [facility] 7/17-10/31/21. Advanced to dysphagia 3/thin liquids by cup by time of d/c from CIR. 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 Difficult to fully assess cognition given severity of linguistic impairment; however, does present with decreased safety awareness, diminished problem-solving, emergent awareness, and mild impulsivity with movement."     DIAGNOSTIC FINDINGS: MBS 10/19/21: "Pt presents with overall mild oropharyngeal dysphagia. Oral phase c/b piecemeal deglutition, decreased mastication, decreased bolus cohesion, premature spillage, and weak lingual manipulation. Pharyngeal  phase c/b reduced pharyngeal peristalsis + reduced BOT approximation to PPW, which results in mild to moderate vallecular and pyriform sinuses residuals (vallecula > pyriform sinuses for solids, pyriform sinuses > vallecula for liquids). No penetration nor aspiration appreciated with thin liquid via cup, Dysphagia 1 (puree), Dysphagia 2 (chopped), Dysphagia 3 (mechanical soft), or with 13 mm Barium tablet placed in puree. Once instance of sensed aspiration  with intake of large bolus of thin liquid via straw due to swallow initiation delay to pyriform sinuses and mistiming of swallow-breath cycle; despite strong reflexive cough aspirant was not fully cleared from trachea. Pt's level of swallow initiation was noted to vary with liquid consumption (over base of epiglottis and at pyriform sinuses) with level of swallow initiation for solids to be consistent at the vallecula. Pharyngeal stasis was partially cleared with reflexive secondary swallow." MRI brain 10/05/21: 1. 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 No associated hemorrhage or significant regional mass effect. 2. Loss of normal flow voids throughout the left ICA and MCA, consistent with slow flow and/or occlusion. 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 3. Underlying mild chronic microvascular ischemic disease. Tiny remote left ACA distribution infarct.   PAIN:  Are you having pain? Yes, shoulder, 5/10   PATIENT GOALS talk better   OBJECTIVE:    TODAY'S TREATMENT:  Pt brought his introduction script to prepare for introducing himself to [employer] group. SLP worked with pt to revise script (omitting some sentences and simplifying sentence structure) to improve accuracy. Initial repetitions with focus on slow rate for accuracy, until accuracy reached 80%. Continued training with refocus on loudness, which required several repetitions for pt to maintain accuracy of 80% with vocal intensity avg 69 dB.    PATIENT EDUCATION: Education details: see today's treatment for details Person educated: Patient Education method: Explanation, Demonstration, and written supports Education comprehension: verbalized understanding and needs further education     GOALS: Goals reviewed with patient? Yes   SHORT TERM GOALS: Target date: 10 sessions   Patient will participate in clinical assessment of swallow  function with goals added as needed. Baseline: Goal status: MET   2.  Pt will communicate emergency information 100% accuracy using visual aid if necessary.  Baseline:  Goal status: MET   3.  Pt will approximate personally relevant words and phrases >80% accuracy using script training and min visual cues for apraxia.   Baseline:  Goal status: MET   4.  Pt will generate at least 4 descriptors of target word 80% of the time using semantic feature analysis to improve abilities in wordfinding and resolving communication breakdowns.  Baseline:  Goal status: MET   5.  Pt will complete HEP for dysphagia with rare min A. Baseline:  Goal status: MET   6.  Pt will generate sentences using personally relevant words list for >80% intelligibility. Baseline: 50% intelligibility Goal status: MET   7.  Pt will improve expressive language skills by taking 4-6 turns in simple conversation, with supported conversation strategies if needed. Baseline:  Goal status: MET 8.  Pt will generate sentences moderately complex sentences 8-10 words >80% intelligibility, with self-correction/multimodal communication allowed. Baseline: 50% intelligibility Goal status: INITIAL   9.  Patient will use intelligibility strategies and script training for 4-6 turn telephone exchange with family member or friend. Baseline:  Goal status: INITIAL     LONG TERM GOALS: Target date: 04/25/2022    Pt will engage in 5-8 minutes  simple-mod complex conversation re: topic of interest with supported conversation, aphasia compensations.  Baseline: 01/25/22: 5 turn exchange. Goal renewed 01/25/22 Goal status: IN PROGRESS   2.  Pt will ID and attempt repair of communication breakdowns >90% of the time in session.    Baseline: 01/25/22: requires occasional mod cues to initiate correction; renewed 01/25/22 Goal status: IN PROGRESS   3.  Pt/family will demonstrate knowledge of community resources and activities to support  language/communication. Baseline: 01/25/22: referral completed to [facility]; pt has yet to register for group, renewed 01/25/22 Goal status: IN PROGRESS   4.  Pt will demonstrate use of swallowing precautions independently with s/sx aspiration <5% of trials. Baseline: 01/25/22: no overt s/sx when sipping water in sessions, pt and family report no issues at home with meals Goal status: DEFERRED       ASSESSMENT:   CLINICAL IMPRESSION: Patient presents with moderate Broca's aphasia, moderate verbal apraxia, and mild dysarthria. With encouragement, extended time and mod level cues, pt able to repair of breakdowns using multimodal means. Pt now initiating use of strategies to slow rate and improve accuracy of verbal output. Continues to improve error awareness and attempts at self-correction. Shows promise with script training to improve intelligibility for short interactions with family/community. Pt often attempts to generate sentence level responses in conversation, but requires cues to provide key words initially for context, and usual cues to slow rate at this level of discourse. I recommend skilled ST to improve pt's language and motor speech skills to improve communication and reduce frustration.    OBJECTIVE IMPAIRMENTS include expressive language, receptive language, aphasia, apraxia, dysarthria, and dysphagia. These impairments are limiting patient from managing medications, managing appointments, managing finances, household responsibilities, ADLs/IADLs, effectively communicating at home and in community, and safety when swallowing. Factors affecting potential to achieve goals and functional outcome are cooperation/participation level; pt appears highly motivated with good family support. Patient will benefit from skilled SLP services to address above impairments and improve overall function.   REHAB POTENTIAL: Excellent   PLAN: SLP FREQUENCY: 2x/week   SLP DURATION: 12 weeks   PLANNED  INTERVENTIONS: Aspiration precaution training, Pharyngeal strengthening exercises, Diet toleration management , Language facilitation, Environmental controls, Trials of upgraded texture/liquids, Cognitive reorganization, Internal/external aids, Functional tasks, Multimodal communication approach, SLP instruction and feedback, Compensatory strategies, and Patient/family education  [name], MS, CCC-SLP Speech-Language Pathologist [phone]    [name], [place] 02/22/2022, 9:04 AM   [place] [address] [place], [place], [address] Phone: [phone]   Fax:  [phone]

## 2022-02-22 NOTE — Progress Notes (Signed)
  Subjective:  Patient ID: Gerald Bauer, male    DOB: [birth year],  MRN: [id number]  Chief Complaint  Patient presents with   Nail Problem   76 y.o. male returns for the above complaint.  Patient presents with thickened and again dystrophic toenails x10 mild pain on palpation hurts with ambulation worse with pressure would like for me to debride down is not able to do it himself.  Objective:  There were no vitals filed for this visit. Podiatric Exam: Vascular: dorsalis pedis and posterior tibial pulses are palpable bilateral. Capillary return is immediate. Temperature gradient is WNL. Skin turgor WNL  Sensorium: Normal Semmes Weinstein monofilament test. Normal tactile sensation bilaterally. Nail Exam: Pt has thick disfigured discolored nails with subungual debris noted bilateral entire nail hallux through fifth toenails.  Pain on palpation to the nails. Ulcer Exam: There is no evidence of ulcer or pre-ulcerative changes or infection. Orthopedic Exam: Muscle tone and strength are WNL. No limitations in general ROM. No crepitus or effusions noted.  Skin: No Porokeratosis. No infection or ulcers    Assessment & Plan:   1. Pain due to onychomycosis of toenails of both feet     Patient was evaluated and treated and all questions answered.  Onychomycosis with pain  -Nails palliatively debrided as below. -Educated on self-care  Procedure: Nail Debridement Rationale: pain  Type of Debridement: manual, sharp debridement. Instrumentation: Nail nipper, rotary burr. Number of Nails: 10  Procedures and Treatment: Consent by patient was obtained for treatment procedures. The patient understood the discussion of treatment and procedures well. All questions were answered thoroughly reviewed. Debridement of mycotic and hypertrophic toenails, 1 through 5 bilateral and clearing of subungual debris. No ulceration, no infection noted.  Return Visit-Office Procedure: Patient instructed to return  to the office for a follow up visit 3 months for continued evaluation and treatment.  Boneta Lucks, DPM    No follow-ups on file.

## 2022-02-22 NOTE — Therapy (Signed)
OUTPATIENT OCCUPATIONAL THERAPY NEURO TREATMENT Patient Name: Gerald Bauer MRN: [id number] DOB:09/29/1945, 76 y.o., male Today's Date: 02/20/2022  PCP: Threasa Alpha, PA REFERRING PROVIDER: Reesa Chew    OT End of Session - 02/14/22 1147     Visit Number 23    Number of Visits 60    Date for OT Re-Evaluation 04/17/22    OT Start Time 95    OT Stop Time 1100    OT Time Calculation (min) 44 min    Activity Tolerance Patient tolerated treatment well    Behavior During Therapy [facility] for tasks assessed/performed                    Past Medical History:  Diagnosis Date   Acid reflux    Arthritis    hands   Carotid atherosclerosis    Diabetes mellitus without complication ([place])    Ganglion cyst 02/23/2015   Hyperlipemia    Hypertension    Joint pain in fingers of right hand 02/23/2015   Dominant hand   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 07/11/2015   Prostate cancer ([place])    history   Stroke (cerebrum) ([place]) 10/06/2021   Tachycardia    Tinea pedis of both feet 01/10/2017   Past Surgical History:  Procedure Laterality Date   [place]  2013   PROSTATECTOMY  2012   Patient Active Problem List   Diagnosis Date Noted   H/O prostatectomy 02/06/2022   Hemiparesis, aphasia, and dysphagia as late effects of stroke ([place]) 11/07/2021   Lower urinary tract symptoms (LUTS) 11/07/2021   Myalgia due to statin 02/21/2021   Type 2 diabetes mellitus without complication, without long-term current use of insulin ([place]) 02/21/2021   Aortic atherosclerosis ([place]) 04/27/2020   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   Onychomycosis of multiple toenails with type 2 diabetes mellitus ([place]) 07/19/2016   Carotid stenosis 03/13/2016   Hyperlipidemia associated with type 2 diabetes mellitus ([place])    Hypertension associated with type 2 diabetes mellitus ([place])    Personal history of prostate cancer    Carotid  atherosclerosis    GERD (gastroesophageal reflux disease) 08/20/2013    ONSET DATE: 10/05/21  REFERRING DIAG: L MCA CVA  THERAPY DIAG:  Muscle weakness (generalized)  Other lack of coordination  Unsteadiness on feet  Rationale for Evaluation and Treatment Rehabilitation  SUBJECTIVE:    SUBJECTIVE STATEMENT:   5/10 pain in right shoulder.  Pt reports he had a nice thanksgiving.  PERTINENT HISTORY: Per chart, Gerald Bauer is a 76 year old right-handed male with history of hypertension, hyperlipidemia, diabetes mellitus, prostate cancer/prostatectomy 2012, quit smoking 24 years ago.  Presented to [facility] 10/05/2021 with right side weakness/facial droop and expressive aphasia.  Blood pressure 155/102.  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  No associated hemorrhage or significant mass effect.    PAIN:  Are you having pain?  5/10 R shoulder; heat, rest, gentle stretch affective for pain management FALLS: Has patient fallen in last 6 months? Yes. Number of falls 3 since CVA  PLOF: Independent/retired Dealer  PATIENT GOALS : Pt points to his hand (wants to be able to use his hand)  OBJECTIVE:   HAND DOMINANCE: Right   FUNCTIONAL OUTCOME MEASURES: FOTO: 48  UUPPER EXTREMITY ROM      Active ROM Right eval Left Eval WNL Right 12/11/21 Right 01/23/2022  Shoulder flexion  90 (135)   95 (110) 95  Shoulder abduction 95 (110)   80 (90) 95  Wrist flex     54 60  Wrist ext     41 45    UPPER EXTREMITY MMT:      MMT Right eval Left Eval 5/5 Right 12/11/21  Shoulder flexion 3-   -3  Shoulder abduction 3-   -3  Shoulder adduction        Shoulder extension        Shoulder internal rotation 3-      Shoulder external rotation 3-      Middle trapezius        Lower trapezius        Elbow flexion 4-   4  Elbow extension 4+   5  Wrist flexion 4   NT d/t pain  Wrist extension 4-   NT d/t pain  (Blank rows = not tested)   HAND  FUNCTION: Eval:         Grip strength: Right: 14 lbs; Left: 58 lbs, Lateral pinch: Right: 7 lbs, Left: 19 lbs, and 3 point pinch: Right: 2 lbs, Left: 16 lbs 10th visit: Grip strength: Right: 16  lbs;                                Lateral pinch: Right: 9  lbs;                            3 point pinch: Right: 5 lbs 20th visit: Grip strength: Right: 20 lbs; Left: 70 lbs, Lateral pinch: Right: 10 lbs, Left: 20 lbs, and 3 point pinch: Right: 9 lbs, Left: 20 lbs    Eval: COORDINATION: Finger Nose Finger test: difficult/lacks precision with reaching targets 9 Hole Peg test: Right: unable sec; Left: 27 sec Able to oppose 2nd and 3rd digits to thumb on R hand   10th visit 9 hole Peg test: Right: 11 min, 44 sec   20th visit:  9 hole Peg test: Right: 3 min 6 sec    TODAY'S TREATMENT:      Therapeutic Exercise:  Pt seen for ROM and strengthening of right UE with use of 45 degree angle wedge on tabletop with use of cloth under right hand, performing forward flexion for 10 repsX 2 sets, diagonal patterns, corner to corner, circles both directions, all directions performed for 2 sets of 10 reps.   1# weight for wrist strengthening for flex/ext, radial and ulnar deviation, supination/pronation, some tenderness in upper arm when performing>  Pt required therapist demonstration and cues for proper form and technique.    Neuromuscular Reeducation:  Pt seen this date for manipulation of ball pegs, difficulty with picking up from container initially but then able to complete with focus and effort.   Removed one at a time, and worked on moving item to palm and then back to fingertips, storing in palm.  Able to perform up to 3 at a time.  Cues for translatory skills of the hand. Dowel with medium to large washers to pick up from magnetic bowl and place onto small dowel, difficulty with reaching to top of dowel stick due to shoulder pain.  Height of table adjusted by therapist.         PATIENT  EDUCATION: Education details: HEP progression, handwriting skills Person educated: pt Education method: Explanation and Verbal cues, handout Education  comprehension: verbalized understanding, returned demo   HOME EXERCISE PROGRAM: Theraputty, hand writing skills, table slides  GOALS: Goals reviewed with patient? Yes   SHORT TERM GOALS: Target date: /23   Pt will be indep to perform HEP for increasing strength and coordination throughout RUE.  Baseline: Not yet initiated; 10th: putty given but pt reports limited use, instructed in table slides and self PROM for R wrist/digits. 20th:  Pt performing exercises but requires continual upgrades and instruction  Goal status: ongoing   2.  Pt will manage clothing fasteners with extra time, using RUE as an assist  Baseline: unable; pt has elastic laces and wears elastic waisted pants; 10th: pt uses R hand as an assist to zip and button jeans with extra time; not yet tried small buttons on a shirt.  Pt continues to use elastic laces on shoes. 20th:  Improving with manipulation skills but still using elastic laces, able to button pants and shirt with increased time and effort.  Goal status: ongoing   LONG TERM GOALS: Target date: /23   Pt will increase FOTO score to 55 or better to indicate improved performance with daily tasks.  Baseline: 48; 10th: 48; 20th: score:42  Goal status: ongoing   2.  Pt will increase R grip strength by 10 or more lbs to enable pt to hold and carry light ADL supplies without dropping.  Baseline: R grip 14#, L 58#; 10th visit: R grip 16; 20th visit: 20# Goal status: ongoing   3.  Pt will increase RUE strength to be able to engage RUE into ADLs at least 50% of the time. Baseline: Pt using L non-dominant arm to manage ADLs; 10th visit: Son reports he constantly sees pt attempt to use his R arm for daily tasks, but continues to be limited and still must use LUE for most tasks (see chart for RUE MMT) 20th:  continues to  improve on strength and is working on consistency of use  Goal status: ongoing   4.  Pt will complete 9 hole peg test on the R in 3 min or less to work towards ability to use R hand to pick up small ADL supplies.  Baseline: Pt can remove a peg but can not pick one up; 10th visit: R 9 hole completion 11 min 44 sec; 20th visit:   Pt improved to 3 mins and 6 secs, nearing goal.  Goal status: ongoing    ASSESSMENT:  CLINICAL IMPRESSION:' Pt continues to progress with RUE and hand function.  Pain has remained about a 5/10 for the last few sessions but overall decreased since start of episode.  Tasks performed today in sitting at table for ROM, strength and functional use of hand.  Pt responds well to cues, he performs best when he can focus on task and perform repetitions to improve his quality of movement patterns.  R shoulder limits reaching patterns greater than 90 degrees of shoulder flexion.  Continue to work towards goals in plan of care to decrease pain, increase ROM, increase strength and improve hand function for use in necessary daily tasks.    PERFORMANCE DEFICITS in functional skills including ADLs, IADLs, coordination, dexterity, ROM, strength, pain, flexibility, FMC, GMC, mobility, balance, continence, decreased knowledge of use of DME, and UE functional use, cognitive skills including safety awareness, and psychosocial skills including.   IMPAIRMENTS are limiting patient from ADLs, IADLs, leisure, and social participation.   COMORBIDITIES may have co-morbidities  that affects occupational performance. Patient will benefit from  skilled OT to address above impairments and improve overall function.  MODIFICATION OR ASSISTANCE TO COMPLETE EVALUATION: Min-Moderate modification of tasks or assist with assess necessary to complete an evaluation.  OT OCCUPATIONAL PROFILE AND HISTORY: Problem focused assessment: Including review of records relating to presenting problem.  CLINICAL DECISION  MAKING: Moderate - several treatment options, min-mod task modification necessary  REHAB POTENTIAL: Good  EVALUATION COMPLEXITY: Moderate    PLAN: OT FREQUENCY: 2x/week  OT DURATION: 12 weeks  PLANNED INTERVENTIONS: self care/ADL training, therapeutic exercise, therapeutic activity, neuromuscular re-education, manual therapy, passive range of motion, balance training, functional mobility training, moist heat, cryotherapy, patient/family education, cognitive remediation/compensation, energy conservation, coping strategies training, and DME and/or AE instructions  RECOMMENDED OTHER SERVICES: N/A  CONSULTED AND AGREED WITH PLAN OF CARE: Patient and family member/caregiver  PLAN FOR NEXT SESSION: HEP progression, neuro re-ed, therapeutic exercises   Stevon Gough T Reginae Wolfrey, OTR/L, CLT Jourden [place], OT 02/14/2022, 12:11 PM

## 2022-02-27 ENCOUNTER — Ambulatory Visit: Payer: Medicare Other | Admitting: Occupational Therapy

## 2022-02-27 ENCOUNTER — Ambulatory Visit: Payer: Medicare Other | Attending: Physical Medicine and Rehabilitation | Admitting: Speech Pathology

## 2022-02-27 ENCOUNTER — Ambulatory Visit: Payer: Medicare Other

## 2022-02-27 DIAGNOSIS — R2681 Unsteadiness on feet: Secondary | ICD-10-CM | POA: Insufficient documentation

## 2022-02-27 DIAGNOSIS — R4701 Aphasia: Secondary | ICD-10-CM | POA: Diagnosis present

## 2022-02-27 DIAGNOSIS — M25611 Stiffness of right shoulder, not elsewhere classified: Secondary | ICD-10-CM | POA: Diagnosis present

## 2022-02-27 DIAGNOSIS — M25511 Pain in right shoulder: Secondary | ICD-10-CM | POA: Insufficient documentation

## 2022-02-27 DIAGNOSIS — R278 Other lack of coordination: Secondary | ICD-10-CM

## 2022-02-27 DIAGNOSIS — R482 Apraxia: Secondary | ICD-10-CM | POA: Insufficient documentation

## 2022-02-27 DIAGNOSIS — M6281 Muscle weakness (generalized): Secondary | ICD-10-CM

## 2022-02-27 DIAGNOSIS — I63512 Cerebral infarction due to unspecified occlusion or stenosis of left middle cerebral artery: Secondary | ICD-10-CM | POA: Insufficient documentation

## 2022-02-27 DIAGNOSIS — G8929 Other chronic pain: Secondary | ICD-10-CM | POA: Insufficient documentation

## 2022-02-27 NOTE — Therapy (Signed)
OUTPATIENT SPEECH LANGUAGE PATHOLOGY TREATMENT NOTE   Patient Name: Gerald Bauer MRN: [id number] DOB:1945-10-29, 76 y.o., male Today's Date: 02/27/2022  PCP: [name], PA-C Referring Provider:  Bary Leriche, PA-C  END OF SESSION:   End of Session - 02/27/22 1354     Visit Number 28    Number of Visits 45    Date for SLP Re-Evaluation 04/25/22    SLP Start Time 0900    SLP Stop Time  1000    SLP Time Calculation (min) 60 min    Activity Tolerance Patient tolerated treatment well             Past Medical History:  Diagnosis Date   Acid reflux    Arthritis    hands   Carotid atherosclerosis    Diabetes mellitus without complication ([place])    Ganglion cyst 02/23/2015   Hyperlipemia    Hypertension    Joint pain in fingers of right hand 02/23/2015   Dominant hand   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 07/11/2015   Prostate cancer ([place])    history   Stroke (cerebrum) ([place]) 10/06/2021   Tachycardia    Tinea pedis of both feet 01/10/2017   Past Surgical History:  Procedure Laterality Date   [place]  2013   PROSTATECTOMY  2012   Patient Active Problem List   Diagnosis Date Noted   H/O prostatectomy 02/06/2022   Hemiparesis, aphasia, and dysphagia as late effects of stroke ([place]) 11/07/2021   Lower urinary tract symptoms (LUTS) 11/07/2021   Myalgia due to statin 02/21/2021   Type 2 diabetes mellitus without complication, without long-term current use of insulin ([place]) 02/21/2021   Aortic atherosclerosis ([place]) 04/27/2020   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   Onychomycosis of multiple toenails with type 2 diabetes mellitus ([place]) 07/19/2016   Carotid stenosis 03/13/2016   Hyperlipidemia associated with type 2 diabetes mellitus ([place])    Hypertension associated with type 2 diabetes mellitus ([place])    Personal history of prostate cancer    Carotid atherosclerosis    GERD  (gastroesophageal reflux disease) 08/20/2013    ONSET DATE: 10/05/2021    REFERRING DIAG: Cerebral infarction due to unspecified occlusion of left middle cerebral artery   THERAPY DIAG:  Verbal apraxia Verbal apraxia   Rationale for Evaluation and Treatment Rehabilitation   SUBJECTIVE STATEMENT:            "Good morning."   Pt accompanied by: daughter in law [name]   PERTINENT HISTORY: Pt  is a 76 year old male with history of left-sided carotid stenosis, non-insulin-dependent diabetes mellitus type 2, PAD, GERD, mild COPD, history of tobacco use, hyperlipidemia, hypertension, who presented 10/05/21 to [facility] ED with facial droop, expressive aphasia, and R sided weakness and found to have left MCA CVA. He was admitted to [facility] 7/17-10/31/21. Advanced to dysphagia 3/thin liquids by cup by time of d/c from CIR. 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 Difficult to fully assess cognition given severity of linguistic impairment; however, does present with decreased safety awareness, diminished problem-solving, emergent awareness, and mild impulsivity with movement."     DIAGNOSTIC FINDINGS: MBS 10/19/21: "Pt presents with overall mild oropharyngeal dysphagia. Oral phase c/b piecemeal deglutition, decreased mastication, decreased bolus cohesion, premature spillage, and weak lingual manipulation. Pharyngeal phase c/b reduced pharyngeal peristalsis + reduced BOT approximation  to PPW, which results in mild to moderate vallecular and pyriform sinuses residuals (vallecula > pyriform sinuses for solids, pyriform sinuses > vallecula for liquids). No penetration nor aspiration appreciated with thin liquid via cup, Dysphagia 1 (puree), Dysphagia 2 (chopped), Dysphagia 3 (mechanical soft), or with 13 mm Barium tablet placed in puree. Once instance of sensed aspiration with intake of large bolus of thin liquid via  straw due to swallow initiation delay to pyriform sinuses and mistiming of swallow-breath cycle; despite strong reflexive cough aspirant was not fully cleared from trachea. Pt's level of swallow initiation was noted to vary with liquid consumption (over base of epiglottis and at pyriform sinuses) with level of swallow initiation for solids to be consistent at the vallecula. Pharyngeal stasis was partially cleared with reflexive secondary swallow." MRI brain 10/05/21: 1. 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 No associated hemorrhage or significant regional mass effect. 2. Loss of normal flow voids throughout the left ICA and MCA, consistent with slow flow and/or occlusion. 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 3. Underlying mild chronic microvascular ischemic disease. Tiny remote left ACA distribution infarct.   PAIN:  Are you having pain? Yes, shoulder, 5/10   PATIENT GOALS talk better   OBJECTIVE:    TODAY'S TREATMENT:  Pt required min-mod cues to use script trained in prior sessions during initial greeting/conversation, for 90% intelligibility. Focused session on generating sentences in conversational turns with use of multimodal communication tools (writing, keywords, gestures) to provide clarification and resolve breakdowns. Intelligibility on first attempt for novel sentences (limited contextual knowledge) was ~40%, however with written support and reattempts with slow rate, louder speech, and tapping, intelligibility improves to 80% at sentence level.    PATIENT EDUCATION: Education details: point to keywords, how communication partners can support and cue for intelligibility Person educated: Patient Education method: Explanation, Demonstration, and written supports Education comprehension: verbalized understanding and needs further education     GOALS: Goals reviewed with patient? Yes   SHORT TERM  GOALS: Target date: 10 sessions   Patient will participate in clinical assessment of swallow function with goals added as needed. Baseline: Goal status: MET   2.  Pt will communicate emergency information 100% accuracy using visual aid if necessary.  Baseline:  Goal status: MET   3.  Pt will approximate personally relevant words and phrases >80% accuracy using script training and min visual cues for apraxia.   Baseline:  Goal status: MET   4.  Pt will generate at least 4 descriptors of target word 80% of the time using semantic feature analysis to improve abilities in wordfinding and resolving communication breakdowns.  Baseline:  Goal status: MET   5.  Pt will complete HEP for dysphagia with rare min A. Baseline:  Goal status: MET   6.  Pt will generate sentences using personally relevant words list for >80% intelligibility. Baseline: 50% intelligibility Goal status: MET   7.  Pt will improve expressive language skills by taking 4-6 turns in simple conversation, with supported conversation strategies if needed. Baseline:  Goal status: MET 8.  Pt will generate sentences moderately complex sentences 8-10 words >80% intelligibility, with self-correction/multimodal communication allowed. Baseline: 50% intelligibility Goal status: INITIAL   9.  Patient will use intelligibility strategies and script training for 4-6 turn telephone exchange with family member or friend. Baseline:  Goal status: INITIAL     LONG TERM GOALS: Target date: 04/25/2022    Pt will engage in  5-8 minutes simple-mod complex conversation re: topic of interest with supported conversation, aphasia compensations.  Baseline: 01/25/22: 5 turn exchange. Goal renewed 01/25/22 Goal status: IN PROGRESS   2.  Pt will ID and attempt repair of communication breakdowns >90% of the time in session.    Baseline: 01/25/22: requires occasional mod cues to initiate correction; renewed 01/25/22 Goal status: IN PROGRESS   3.   Pt/family will demonstrate knowledge of community resources and activities to support language/communication. Baseline: 01/25/22: referral completed to [facility]; pt has yet to register for group, renewed 01/25/22 Goal status: IN PROGRESS   4.  Pt will demonstrate use of swallowing precautions independently with s/sx aspiration <5% of trials. Baseline: 01/25/22: no overt s/sx when sipping water in sessions, pt and family report no issues at home with meals Goal status: DEFERRED       ASSESSMENT:   CLINICAL IMPRESSION: Patient presents with moderate Broca's aphasia, moderate verbal apraxia, and mild dysarthria. With encouragement, extended time and mod level cues, pt able to repair of breakdowns using multimodal means. Shows promise with script training to improve intelligibility for short interactions with family/community. Pt often attempts to generate sentence level responses in conversation, but requires cues to provide key words initially for context, loudness, and usual cues to slow rate at this level of discourse. I recommend skilled ST to improve pt's language and motor speech skills to improve communication and reduce frustration.    OBJECTIVE IMPAIRMENTS include expressive language, receptive language, aphasia, apraxia, dysarthria, and dysphagia. These impairments are limiting patient from managing medications, managing appointments, managing finances, household responsibilities, ADLs/IADLs, effectively communicating at home and in community, and safety when swallowing. Factors affecting potential to achieve goals and functional outcome are cooperation/participation level; pt appears highly motivated with good family support. Patient will benefit from skilled SLP services to address above impairments and improve overall function.   REHAB POTENTIAL: Excellent   PLAN: SLP FREQUENCY: 2x/week   SLP DURATION: 12 weeks   PLANNED INTERVENTIONS: Aspiration precaution training, Pharyngeal  strengthening exercises, Diet toleration management , Language facilitation, Environmental controls, Trials of upgraded texture/liquids, Cognitive reorganization, Internal/external aids, Functional tasks, Multimodal communication approach, SLP instruction and feedback, Compensatory strategies, and Patient/family education  [name], MS, CCC-SLP Speech-Language Pathologist [phone]    [name], [place] 02/27/2022, 1:55 PM   [facility] [address] [place], [place], [address] Phone: [phone]   Fax:  [phone]

## 2022-02-28 ENCOUNTER — Encounter: Payer: Self-pay | Admitting: Occupational Therapy

## 2022-02-28 NOTE — Therapy (Signed)
OUTPATIENT OCCUPATIONAL THERAPY NEURO TREATMENT  Patient Name: Gerald Bauer MRN: [id number] DOB:1945/04/11, 76 y.o., male Today's Date: 02/27/2022  PCP: Gerald Alpha, PA REFERRING PROVIDER: Reesa Bauer    OT End of Session - 02/28/22 1943     Visit Number 26    Number of Visits 44    Date for OT Re-Evaluation 04/17/22    OT Start Time 41    OT Stop Time 1147    OT Time Calculation (min) 47 min    Activity Tolerance Patient tolerated treatment well    Behavior During Therapy [facility] for tasks assessed/performed                    Past Medical History:  Diagnosis Date   Acid reflux    Arthritis    hands   Carotid atherosclerosis    Diabetes mellitus without complication ([place])    Ganglion cyst 02/23/2015   Hyperlipemia    Hypertension    Joint pain in fingers of right hand 02/23/2015   Dominant hand   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 07/11/2015   Prostate cancer ([place])    history   Stroke (cerebrum) ([place]) 10/06/2021   Tachycardia    Tinea pedis of both feet 01/10/2017   Past Surgical History:  Procedure Laterality Date   [place]  2013   PROSTATECTOMY  2012   Patient Active Problem List   Diagnosis Date Noted   H/O prostatectomy 02/06/2022   Hemiparesis, aphasia, and dysphagia as late effects of stroke ([place]) 11/07/2021   Lower urinary tract symptoms (LUTS) 11/07/2021   Myalgia due to statin 02/21/2021   Type 2 diabetes mellitus without complication, without long-term current use of insulin ([place]) 02/21/2021   Aortic atherosclerosis ([place]) 04/27/2020   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   Onychomycosis of multiple toenails with type 2 diabetes mellitus ([place]) 07/19/2016   Carotid stenosis 03/13/2016   Hyperlipidemia associated with type 2 diabetes mellitus ([place])    Hypertension associated with type 2 diabetes mellitus ([place])    Personal history of prostate cancer    Carotid  atherosclerosis    GERD (gastroesophageal reflux disease) 08/20/2013    ONSET DATE: 10/05/21  REFERRING DIAG: L MCA CVA  THERAPY DIAG:  Muscle weakness (generalized)  Other lack of coordination  Left middle cerebral artery stroke (HCC)  Unsteadiness on feet  Rationale for Evaluation and Treatment Rehabilitation  SUBJECTIVE:    SUBJECTIVE STATEMENT:  Pt reports pain in right shoulder 5/10 at beginning of session, reduced down to 2/10 during session after manual therapy techniques and ROM.  PERTINENT HISTORY: Per chart, Gerald Bauer is a 76 year old right-handed male with history of hypertension, hyperlipidemia, diabetes mellitus, prostate cancer/prostatectomy 2012, quit smoking 24 years ago.  Presented to [facility]/05/2021 with right side weakness/facial droop and expressive aphasia.  Blood pressure 155/102.  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  No associated hemorrhage or significant mass effect.    PAIN:  Are you having pain?  5/10 R shoulder; heat, rest, gentle stretch affective for pain management FALLS: Has patient fallen in last 6 months? Yes. Number of falls 3 since CVA  PLOF: Independent/retired Dealer  PATIENT GOALS : Pt points to his hand (wants to be able to use his hand)  OBJECTIVE:   HAND DOMINANCE: Right   FUNCTIONAL OUTCOME MEASURES: FOTO: 48  UUPPER EXTREMITY ROM  Active ROM Right eval Left Eval WNL Right 12/11/21 Right 01/23/2022  Shoulder flexion 90 (135)   95 (110) 95  Shoulder abduction 95 (110)   80 (90) 95  Wrist flex     54 60  Wrist ext     41 45    UPPER EXTREMITY MMT:      MMT Right eval Left Eval 5/5 Right 12/11/21  Shoulder flexion 3-   -3  Shoulder abduction 3-   -3  Shoulder adduction        Shoulder extension        Shoulder internal rotation 3-      Shoulder external rotation 3-      Middle trapezius        Lower trapezius        Elbow flexion 4-   4  Elbow  extension 4+   5  Wrist flexion 4   NT d/t pain  Wrist extension 4-   NT d/t pain  (Blank rows = not tested)   HAND FUNCTION: Eval:         Grip strength: Right: 14 lbs; Left: 58 lbs, Lateral pinch: Right: 7 lbs, Left: 19 lbs, and 3 point pinch: Right: 2 lbs, Left: 16 lbs 10th visit: Grip strength: Right: 16  lbs;                                Lateral pinch: Right: 9  lbs;                            3 point pinch: Right: 5 lbs 20th visit: Grip strength: Right: 20 lbs; Left: 70 lbs, Lateral pinch: Right: 10 lbs, Left: 20 lbs, and 3 point pinch: Right: 9 lbs, Left: 20 lbs    Eval: COORDINATION: Finger Nose Finger test: difficult/lacks precision with reaching targets 9 Hole Peg test: Right: unable sec; Left: 27 sec Able to oppose 2nd and 3rd digits to thumb on R hand   10th visit 9 hole Peg test: Right: 11 min, 44 sec   20th visit:  9 hole Peg test: Right: 3 min 6 sec    TODAY'S TREATMENT:     Manual Therapy:  Patient seen in sidelying for scapular mobilization, for elevation, retraction, and upward rotation by therapist, followed by active assistive range of motion for the same movement patterns. Joint mobilizations to posterior and inferior joint capsule at shoulder, grade II to decrease pain and increase motion.     Therapeutic Exercise:  Following manual therapy, patient was seen for passive range of motion of right shoulder, shoulder flexion to 90 degrees, abduction to 90, elbow, forearm, wrist, and hand.  Additional focus on passive range of motion for finger extension with prolonged stretching.   In supine, pt seen for facilitation of right shoulder flexion to 90 of motion with place and hold for 10 repetitions, 2 sets. Place and hold with external perturbations for 10 repetitions, followed by small, circular motions in both directions with arm maintained at 90 of flexion, then performing crosses/ plus signs with arm. Therapist assisting as needed with exercises to support at elbow  to maintain position.  Range of motion with medium therapy ball for shoulder flexion to 90, 10 repetitions, chest press, and alternate shoulder taps performed in supine, therapist cues and assist as needed for proper form, technique, and pacing of exercises. 1# dowel exercises for  chest press, forwards/backwards circles and ABD/ADD for 10 reps for 1 set with therapist demonstration and cues for proper form and technique.     Neuromuscular re-education: In sitting, pt seen for manipulation of grooved pegs, able to pick up and turn with effort and place into board, able to complete task in its entirety. Removing with use of translatory movements of the hand, using the hand for storage, up to 5 at a time in palm, occasional cues for prehension patterns.  Continues to drop items occasionally but improving over time.       PATIENT EDUCATION: Education details: HEP progression, Engineer, production Person educated: pt Education method: Explanation and Verbal cues, handout Education comprehension: verbalized understanding, returned demo   HOME EXERCISE PROGRAM: Theraputty, hand writing skills, table slides  GOALS: Goals reviewed with patient? Yes   SHORT TERM GOALS: Target date: /23   Pt will be indep to perform HEP for increasing strength and coordination throughout RUE.  Baseline: Not yet initiated; 10th: putty given but pt reports limited use, instructed in table slides and self PROM for R wrist/digits. 20th:  Pt performing exercises but requires continual upgrades and instruction  Goal status: ongoing   2.  Pt will manage clothing fasteners with extra time, using RUE as an assist  Baseline: unable; pt has elastic laces and wears elastic waisted pants; 10th: pt uses R hand as an assist to zip and button jeans with extra time; not yet tried small buttons on a shirt.  Pt continues to use elastic laces on shoes. 20th:  Improving with manipulation skills but still using elastic laces, able to  button pants and shirt with increased time and effort.  Goal status: ongoing   LONG TERM GOALS: Target date: /23   Pt will increase FOTO score to 55 or better to indicate improved performance with daily tasks.  Baseline: 48; 10th: 48; 20th: score:42  Goal status: ongoing   2.  Pt will increase R grip strength by 10 or more lbs to enable pt to hold and carry light ADL supplies without dropping.  Baseline: R grip 14#, L 58#; 10th visit: R grip 16; 20th visit: 20# Goal status: ongoing   3.  Pt will increase RUE strength to be able to engage RUE into ADLs at least 50% of the time. Baseline: Pt using L non-dominant arm to manage ADLs; 10th visit: Son reports he constantly sees pt attempt to use his R arm for daily tasks, but continues to be limited and still must use LUE for most tasks (see chart for RUE MMT) 20th:  continues to improve on strength and is working on consistency of use  Goal status: ongoing   4.  Pt will complete 9 hole peg test on the R in 3 min or less to work towards ability to use R hand to pick up small ADL supplies.  Baseline: Pt can remove a peg but can not pick one up; 10th visit: R 9 hole completion 11 min 44 sec; 20th visit:   Pt improved to 3 mins and 6 secs, nearing goal.  Goal status: ongoing    ASSESSMENT:  CLINICAL IMPRESSION: Pt with 5/10 pain at the beginning of session and was decreased down to 2/10 after manual skills and ROM exercises in supine.  Pt able to engage in exercises in gravity eliminated position with shoulder motion in flexion up to 90 degrees.  Improved control of UE throughout the available range.  Pt advanced to manipulation of small grooved  pegs and performed well, dropping items occasionally but able to demonstrate turning of items to place into board.  Pt demonstrating improvements with translatory skills of the hand and moving items to palm to use hand for storage.  Continue to work towards goals in plan of care to decrease pain, increase  motion and improve functional use of RUE in daily activities.    PERFORMANCE DEFICITS in functional skills including ADLs, IADLs, coordination, dexterity, ROM, strength, pain, flexibility, FMC, GMC, mobility, balance, continence, decreased knowledge of use of DME, and UE functional use, cognitive skills including safety awareness, and psychosocial skills including.   IMPAIRMENTS are limiting patient from ADLs, IADLs, leisure, and social participation.   COMORBIDITIES may have co-morbidities  that affects occupational performance. Patient will benefit from skilled OT to address above impairments and improve overall function.  MODIFICATION OR ASSISTANCE TO COMPLETE EVALUATION: Min-Moderate modification of tasks or assist with assess necessary to complete an evaluation.  OT OCCUPATIONAL PROFILE AND HISTORY: Problem focused assessment: Including review of records relating to presenting problem.  CLINICAL DECISION MAKING: Moderate - several treatment options, min-mod task modification necessary  REHAB POTENTIAL: Good  EVALUATION COMPLEXITY: Moderate    PLAN: OT FREQUENCY: 2x/week  OT DURATION: 12 weeks  PLANNED INTERVENTIONS: self care/ADL training, therapeutic exercise, therapeutic activity, neuromuscular re-education, manual therapy, passive range of motion, balance training, functional mobility training, moist heat, cryotherapy, patient/family education, cognitive remediation/compensation, energy conservation, coping strategies training, and DME and/or AE instructions  RECOMMENDED OTHER SERVICES: N/A  CONSULTED AND AGREED WITH PLAN OF CARE: Patient and family member/caregiver  PLAN FOR NEXT SESSION: HEP progression, neuro re-ed, therapeutic exercises  Tahiri Shareef T Ellajane Stong, OTR/L, CLT  Tremon Sainvil, OT 02/28/2022, 7:45 PM

## 2022-03-01 ENCOUNTER — Ambulatory Visit: Payer: Medicare Other | Admitting: Occupational Therapy

## 2022-03-01 ENCOUNTER — Ambulatory Visit: Payer: Medicare Other | Admitting: Speech Pathology

## 2022-03-01 ENCOUNTER — Ambulatory Visit: Payer: Medicare Other

## 2022-03-01 DIAGNOSIS — R482 Apraxia: Secondary | ICD-10-CM

## 2022-03-01 DIAGNOSIS — M6281 Muscle weakness (generalized): Secondary | ICD-10-CM

## 2022-03-01 DIAGNOSIS — R4701 Aphasia: Secondary | ICD-10-CM

## 2022-03-01 DIAGNOSIS — I63512 Cerebral infarction due to unspecified occlusion or stenosis of left middle cerebral artery: Secondary | ICD-10-CM

## 2022-03-01 DIAGNOSIS — R278 Other lack of coordination: Secondary | ICD-10-CM

## 2022-03-01 DIAGNOSIS — R2681 Unsteadiness on feet: Secondary | ICD-10-CM

## 2022-03-01 NOTE — Therapy (Signed)
OUTPATIENT SPEECH LANGUAGE PATHOLOGY TREATMENT NOTE   Patient Name: Gerald Bauer MRN: [id number] DOB:[birth year], 76 y.o., male Today's Date: 03/01/2022  PCP: [name], PA-C Referring Provider:  Bary Leriche, PA-C  END OF SESSION:   End of Session - 03/01/22 0902     Visit Number 29    Number of Visits 45    Date for SLP Re-Evaluation 04/25/22    SLP Start Time 0900    SLP Stop Time  1000    SLP Time Calculation (min) 60 min    Activity Tolerance Patient tolerated treatment well             Past Medical History:  Diagnosis Date   Acid reflux    Arthritis    hands   Carotid atherosclerosis    Diabetes mellitus without complication ([place])    Ganglion cyst 02/23/2015   Hyperlipemia    Hypertension    Joint pain in fingers of right hand 02/23/2015   Dominant hand   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 07/11/2015   Prostate cancer ([place])    history   Stroke (cerebrum) ([place]) 10/06/2021   Tachycardia    Tinea pedis of both feet 01/10/2017   Past Surgical History:  Procedure Laterality Date   [place]  2013   PROSTATECTOMY  2012   Patient Active Problem List   Diagnosis Date Noted   H/O prostatectomy 02/06/2022   Hemiparesis, aphasia, and dysphagia as late effects of stroke ([place]) 11/07/2021   Lower urinary tract symptoms (LUTS) 11/07/2021   Myalgia due to statin 02/21/2021   Type 2 diabetes mellitus without complication, without long-term current use of insulin ([place]) 02/21/2021   Aortic atherosclerosis ([place]) 04/27/2020   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   Onychomycosis of multiple toenails with type 2 diabetes mellitus ([place]) 07/19/2016   Carotid stenosis 03/13/2016   Hyperlipidemia associated with type 2 diabetes mellitus ([place])    Hypertension associated with type 2 diabetes mellitus ([place])    Personal history of prostate cancer    Carotid atherosclerosis    GERD  (gastroesophageal reflux disease) 08/20/2013    ONSET DATE: 10/05/2021    REFERRING DIAG: Cerebral infarction due to unspecified occlusion of left middle cerebral artery   THERAPY DIAG:  Verbal apraxia Verbal apraxia   Rationale for Evaluation and Treatment Rehabilitation   SUBJECTIVE STATEMENT:            "Speech."   Pt accompanied by: daughter in law [name]   PERTINENT HISTORY: Pt  is a 76 year old male with history of left-sided carotid stenosis, non-insulin-dependent diabetes mellitus type 2, PAD, GERD, mild COPD, history of tobacco use, hyperlipidemia, hypertension, who presented 10/05/21 to [facility] ED with facial droop, expressive aphasia, and R sided weakness and found to have left MCA CVA. He was admitted to [facility] 7/17-10/31/21. Advanced to dysphagia 3/thin liquids by cup by time of d/c from CIR. 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 Difficult to fully assess cognition given severity of linguistic impairment; however, does present with decreased safety awareness, diminished problem-solving, emergent awareness, and mild impulsivity with movement."     DIAGNOSTIC FINDINGS: MBS 10/19/21: "Pt presents with overall mild oropharyngeal dysphagia. Oral phase c/b piecemeal deglutition, decreased mastication, decreased bolus cohesion, premature spillage, and weak lingual manipulation. Pharyngeal phase c/b reduced pharyngeal peristalsis + reduced BOT approximation to  PPW, which results in mild to moderate vallecular and pyriform sinuses residuals (vallecula > pyriform sinuses for solids, pyriform sinuses > vallecula for liquids). No penetration nor aspiration appreciated with thin liquid via cup, Dysphagia 1 (puree), Dysphagia 2 (chopped), Dysphagia 3 (mechanical soft), or with 13 mm Barium tablet placed in puree. Once instance of sensed aspiration with intake of large bolus of thin liquid via straw due  to swallow initiation delay to pyriform sinuses and mistiming of swallow-breath cycle; despite strong reflexive cough aspirant was not fully cleared from trachea. Pt's level of swallow initiation was noted to vary with liquid consumption (over base of epiglottis and at pyriform sinuses) with level of swallow initiation for solids to be consistent at the vallecula. Pharyngeal stasis was partially cleared with reflexive secondary swallow." MRI brain 10/05/21: 1. 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 No associated hemorrhage or significant regional mass effect. 2. Loss of normal flow voids throughout the left ICA and MCA, consistent with slow flow and/or occlusion. 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 3. Underlying mild chronic microvascular ischemic disease. Tiny remote left ACA distribution infarct.   PAIN:  Are you having pain? Yes, shoulder, 3/10   PATIENT GOALS talk better   OBJECTIVE:    TODAY'S TREATMENT:  Pt reports using scripts to aid communication during calls with his son and a friend, Bev. Targeted intelligibility generating sentences with personally relevant words, sentences 8-14 words in length, for overall intelligibility 83%, improves to 90% with pt self-correcting in response to written feedback.   PATIENT EDUCATION: Education details: point to keywords, how communication partners can support and cue for intelligibility Person educated: Patient Education method: Explanation, Demonstration, and written supports Education comprehension: verbalized understanding and needs further education     GOALS: Goals reviewed with patient? Yes   SHORT TERM GOALS: Target date: 10 sessions   Patient will participate in clinical assessment of swallow function with goals added as needed. Baseline: Goal status: MET   2.  Pt will communicate emergency information 100% accuracy using visual aid if  necessary.  Baseline:  Goal status: MET   3.  Pt will approximate personally relevant words and phrases >80% accuracy using script training and min visual cues for apraxia.   Baseline:  Goal status: MET   4.  Pt will generate at least 4 descriptors of target word 80% of the time using semantic feature analysis to improve abilities in wordfinding and resolving communication breakdowns.  Baseline:  Goal status: MET   5.  Pt will complete HEP for dysphagia with rare min A. Baseline:  Goal status: MET   6.  Pt will generate sentences using personally relevant words list for >80% intelligibility. Baseline: 50% intelligibility Goal status: MET   7.  Pt will improve expressive language skills by taking 4-6 turns in simple conversation, with supported conversation strategies if needed. Baseline:  Goal status: MET 8.  Pt will generate sentences moderately complex sentences 8-10 words >80% intelligibility, with self-correction/multimodal communication allowed. Baseline: 50% intelligibility Goal status: INITIAL   9.  Patient will use intelligibility strategies and script training for 4-6 turn telephone exchange with family member or friend. Baseline:  Goal status: INITIAL     LONG TERM GOALS: Target date: 04/25/2022    Pt will engage in 5-8 minutes simple-mod complex conversation re: topic of interest with supported conversation, aphasia compensations.  Baseline: 01/25/22: 5 turn exchange. Goal renewed 01/25/22 Goal status: IN PROGRESS   2.  Pt will ID and attempt repair of communication breakdowns >90% of the time in session.    Baseline: 01/25/22: requires occasional mod cues to initiate correction; renewed 01/25/22 Goal status: IN PROGRESS   3.  Pt/family will demonstrate knowledge of community resources and activities to support language/communication. Baseline: 01/25/22: referral completed to [facility]; pt has yet to register for group, renewed 01/25/22 Goal status: IN PROGRESS   4.  Pt  will demonstrate use of swallowing precautions independently with s/sx aspiration <5% of trials. Baseline: 01/25/22: no overt s/sx when sipping water in sessions, pt and family report no issues at home with meals Goal status: DEFERRED       ASSESSMENT:   CLINICAL IMPRESSION: Patient presents with moderate Broca's aphasia, moderate verbal apraxia, and mild dysarthria. With encouragement, extended time and mod level cues, pt able to repair of breakdowns using multimodal means. Shows promise with script training to improve intelligibility for short interactions with family/community. Pt often attempts to generate sentence level responses in conversation, but requires cues to provide key words initially for context, loudness, and usual cues to slow rate at this level of discourse. I recommend skilled ST to improve pt's language and motor speech skills to improve communication and reduce frustration.    OBJECTIVE IMPAIRMENTS include expressive language, receptive language, aphasia, apraxia, dysarthria, and dysphagia. These impairments are limiting patient from managing medications, managing appointments, managing finances, household responsibilities, ADLs/IADLs, effectively communicating at home and in community, and safety when swallowing. Factors affecting potential to achieve goals and functional outcome are cooperation/participation level; pt appears highly motivated with good family support. Patient will benefit from skilled SLP services to address above impairments and improve overall function.   REHAB POTENTIAL: Excellent   PLAN: SLP FREQUENCY: 2x/week   SLP DURATION: 12 weeks   PLANNED INTERVENTIONS: Aspiration precaution training, Pharyngeal strengthening exercises, Diet toleration management , Language facilitation, Environmental controls, Trials of upgraded texture/liquids, Cognitive reorganization, Internal/external aids, Functional tasks, Multimodal communication approach, SLP instruction  and feedback, Compensatory strategies, and Patient/family education  [name], MS, CCC-SLP Speech-Language Pathologist [phone]    [name], [place] 03/01/2022, 9:02 AM   [facility] [address] [place], [place], [address] Phone: [phone]   Fax:  [phone]

## 2022-03-02 ENCOUNTER — Encounter: Payer: Self-pay | Admitting: Occupational Therapy

## 2022-03-02 NOTE — Therapy (Signed)
OUTPATIENT OCCUPATIONAL THERAPY NEURO TREATMENT  Patient Name: Gerald Bauer MRN: [id number] DOB:06/28/1945, 76 y.o., male Today's Date: 03/01/2022  PCP: Threasa Alpha, PA REFERRING PROVIDER: Reesa Chew    OT End of Session - 03/02/22 1904     Visit Number 27    Number of Visits 44    Date for OT Re-Evaluation 04/17/22    OT Start Time 1025    OT Stop Time 1114    OT Time Calculation (min) 49 min    Activity Tolerance Patient tolerated treatment well    Behavior During Therapy [facility] for tasks assessed/performed                    Past Medical History:  Diagnosis Date   Acid reflux    Arthritis    hands   Carotid atherosclerosis    Diabetes mellitus without complication ([place])    Ganglion cyst 02/23/2015   Hyperlipemia    Hypertension    Joint pain in fingers of right hand 02/23/2015   Dominant hand   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 07/11/2015   Prostate cancer ([place])    history   Stroke (cerebrum) ([place]) 10/06/2021   Tachycardia    Tinea pedis of both feet 01/10/2017   Past Surgical History:  Procedure Laterality Date   [place]  2013   PROSTATECTOMY  2012   Patient Active Problem List   Diagnosis Date Noted   H/O prostatectomy 02/06/2022   Hemiparesis, aphasia, and dysphagia as late effects of stroke ([place]) 11/07/2021   Lower urinary tract symptoms (LUTS) 11/07/2021   Myalgia due to statin 02/21/2021   Type 2 diabetes mellitus without complication, without long-term current use of insulin ([place]) 02/21/2021   Aortic atherosclerosis ([place]) 04/27/2020   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   Onychomycosis of multiple toenails with type 2 diabetes mellitus ([place]) 07/19/2016   Carotid stenosis 03/13/2016   Hyperlipidemia associated with type 2 diabetes mellitus ([place])    Hypertension associated with type 2 diabetes mellitus ([place])    Personal history of prostate cancer    Carotid  atherosclerosis    GERD (gastroesophageal reflux disease) 08/20/2013    ONSET DATE: 10/05/21  REFERRING DIAG: L MCA CVA  THERAPY DIAG:  Muscle weakness (generalized)  Other lack of coordination  Unsteadiness on feet  Left middle cerebral artery stroke ([place])  Rationale for Evaluation and Treatment Rehabilitation  SUBJECTIVE:    SUBJECTIVE STATEMENT:  5/10 pain at beginning of session.  Has been doing some exercises at home while lying down and feels it is going well.  Pt has been trying to use right hand at home for tasks such as self feeding, still spills and drops food at times.   Pain at end of session, 0.  PERTINENT HISTORY: Per chart, Gerald Bauer is a 76 year old right-handed male with history of hypertension, hyperlipidemia, diabetes mellitus, prostate cancer/prostatectomy 2012, quit smoking 24 years ago.  Presented to [facility] 10/05/2021 with right side weakness/facial droop and expressive aphasia.  Blood pressure 155/102.  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  No associated hemorrhage or significant mass effect.    PAIN:  Are you having pain?  5/10 R shoulder; heat, rest, gentle stretch affective for pain management, no pain at end of session FALLS: Has patient fallen in last 6 months? Yes. Number of falls 3 since CVA  PLOF: Independent/retired  Dealer  PATIENT GOALS : Pt points to his hand (wants to be able to use his hand)  OBJECTIVE:   HAND DOMINANCE: Right   FUNCTIONAL OUTCOME MEASURES: FOTO: 48  UUPPER EXTREMITY ROM      Active ROM Right eval Left Eval WNL Right 12/11/21 Right 01/23/2022  Shoulder flexion 90 (135)   95 (110) 95  Shoulder abduction 95 (110)   80 (90) 95  Wrist flex     54 60  Wrist ext     41 45    UPPER EXTREMITY MMT:      MMT Right eval Left Eval 5/5 Right 12/11/21  Shoulder flexion 3-   -3  Shoulder abduction 3-   -3  Shoulder adduction        Shoulder extension         Shoulder internal rotation 3-      Shoulder external rotation 3-      Middle trapezius        Lower trapezius        Elbow flexion 4-   4  Elbow extension 4+   5  Wrist flexion 4   NT d/t pain  Wrist extension 4-   NT d/t pain  (Blank rows = not tested)   HAND FUNCTION: Eval:         Grip strength: Right: 14 lbs; Left: 58 lbs, Lateral pinch: Right: 7 lbs, Left: 19 lbs, and 3 point pinch: Right: 2 lbs, Left: 16 lbs 10th visit: Grip strength: Right: 16  lbs;                                Lateral pinch: Right: 9  lbs;                            3 point pinch: Right: 5 lbs 20th visit: Grip strength: Right: 20 lbs; Left: 70 lbs, Lateral pinch: Right: 10 lbs, Left: 20 lbs, and 3 point pinch: Right: 9 lbs, Left: 20 lbs    Eval: COORDINATION: Finger Nose Finger test: difficult/lacks precision with reaching targets 9 Hole Peg test: Right: unable sec; Left: 27 sec Able to oppose 2nd and 3rd digits to thumb on R hand   10th visit 9 hole Peg test: Right: 11 min, 44 sec   20th visit:  9 hole Peg test: Right: 3 min 6 sec    TODAY'S TREATMENT:   Moist heat to right shoulder for 5 mins prior to therapeutic intervention to decrease pain, increase tissue mobility   Manual Therapy:  Patient seen in sidelying for scapular mobilization, for elevation, retraction, and upward rotation by therapist, followed by active assistive range of motion for the same movement patterns. Joint mobilizations to posterior and inferior joint capsule at shoulder, grade II to decrease pain and increase motion.     Therapeutic Exercise:  Following manual therapy, patient was seen for passive range of motion of right shoulder, shoulder flexion to 90 degrees, abduction to 90, elbow, forearm, wrist, and hand.  Additional focus on passive range of motion for finger extension with prolonged stretching (performed while on heat)   In supine, pt seen for facilitation of right shoulder flexion to 90 of motion with place and hold  for 10 repetitions, 2 sets. Place and hold with external perturbations for 10 repetitions, followed by small, circular motions in both directions with arm maintained at 90 of  flexion, then performing crosses/ plus signs with arm. Therapist assisting as needed with exercises to support at elbow to maintain position.  1# dowel exercises for chest press, forwards/backwards circles and ABD/ADD for 10 reps for 1 set with therapist demonstration and cues for proper form and technique.     Neuromuscular re-education: Patient utilizing small to medium sized washers retrieving from a magnetic bowl and placing onto hanging hook magnets on a tabletop easel white board.  Board placed in tabletop height.  Pt utilizing R UE during task with crossover reaching, forward reach with easel near and far.  When removing washers, pt moving to palm to use the hand for storage.  Difficulty at times with maintaining sustained reaching pattern across the board when removing items and increased shoulder pain with attempts.  Therapist removed remaining washers and pt picking up from flat surface and placing back into magnetic container.          PATIENT EDUCATION: Education details: HEP progression, Engineer, production Person educated: pt Education method: Explanation and Verbal cues, handout Education comprehension: verbalized understanding, returned demo   HOME EXERCISE PROGRAM: Theraputty, hand writing skills, table slides  GOALS: Goals reviewed with patient? Yes   SHORT TERM GOALS: Target date: /23   Pt will be indep to perform HEP for increasing strength and coordination throughout RUE.  Baseline: Not yet initiated; 10th: putty given but pt reports limited use, instructed in table slides and self PROM for R wrist/digits. 20th:  Pt performing exercises but requires continual upgrades and instruction  Goal status: ongoing   2.  Pt will manage clothing fasteners with extra time, using RUE as an assist  Baseline:  unable; pt has elastic laces and wears elastic waisted pants; 10th: pt uses R hand as an assist to zip and button jeans with extra time; not yet tried small buttons on a shirt.  Pt continues to use elastic laces on shoes. 20th:  Improving with manipulation skills but still using elastic laces, able to button pants and shirt with increased time and effort.  Goal status: ongoing   LONG TERM GOALS: Target date: /23   Pt will increase FOTO score to 55 or better to indicate improved performance with daily tasks.  Baseline: 48; 10th: 48; 20th: score:42  Goal status: ongoing   2.  Pt will increase R grip strength by 10 or more lbs to enable pt to hold and carry light ADL supplies without dropping.  Baseline: R grip 14#, L 58#; 10th visit: R grip 16; 20th visit: 20# Goal status: ongoing   3.  Pt will increase RUE strength to be able to engage RUE into ADLs at least 50% of the time. Baseline: Pt using L non-dominant arm to manage ADLs; 10th visit: Son reports he constantly sees pt attempt to use his R arm for daily tasks, but continues to be limited and still must use LUE for most tasks (see chart for RUE MMT) 20th:  continues to improve on strength and is working on consistency of use  Goal status: ongoing   4.  Pt will complete 9 hole peg test on the R in 3 min or less to work towards ability to use R hand to pick up small ADL supplies.  Baseline: Pt can remove a peg but can not pick one up; 10th visit: R 9 hole completion 11 min 44 sec; 20th visit:   Pt improved to 3 mins and 6 secs, nearing goal.  Goal status: ongoing  ASSESSMENT:  CLINICAL IMPRESSION: Pt with 5/10 pain at the beginning of session and reported no pain at the end of session, which is the first time he has reported no pain.  Pt tolerating exercises in supine well and progressing with motion, coordination of movement patterns and reduction in pain.  Pt has continued to make significant progress with right hand function with  picking up and manipulation of small objects, progressing to translatory skills of the hand and using hand for storage.  He continues to drop items occasionally but it is less frequent as his hand function improves.  He is starting to feed himself more consistently with his right hand.  Will continue to work towards pain reduction and improving motion and coordination of right UE for ADL and IADL tasks.     PERFORMANCE DEFICITS in functional skills including ADLs, IADLs, coordination, dexterity, ROM, strength, pain, flexibility, FMC, GMC, mobility, balance, continence, decreased knowledge of use of DME, and UE functional use, cognitive skills including safety awareness, and psychosocial skills including.   IMPAIRMENTS are limiting patient from ADLs, IADLs, leisure, and social participation.   COMORBIDITIES may have co-morbidities  that affects occupational performance. Patient will benefit from skilled OT to address above impairments and improve overall function.  MODIFICATION OR ASSISTANCE TO COMPLETE EVALUATION: Min-Moderate modification of tasks or assist with assess necessary to complete an evaluation.  OT OCCUPATIONAL PROFILE AND HISTORY: Problem focused assessment: Including review of records relating to presenting problem.  CLINICAL DECISION MAKING: Moderate - several treatment options, min-mod task modification necessary  REHAB POTENTIAL: Good  EVALUATION COMPLEXITY: Moderate    PLAN: OT FREQUENCY: 2x/week  OT DURATION: 12 weeks  PLANNED INTERVENTIONS: self care/ADL training, therapeutic exercise, therapeutic activity, neuromuscular re-education, manual therapy, passive range of motion, balance training, functional mobility training, moist heat, cryotherapy, patient/family education, cognitive remediation/compensation, energy conservation, coping strategies training, and DME and/or AE instructions  RECOMMENDED OTHER SERVICES: N/A  CONSULTED AND AGREED WITH PLAN OF CARE: Patient  and family member/caregiver  PLAN FOR NEXT SESSION: HEP progression, neuro re-ed, therapeutic exercises  Chakita Mcgraw T Laila Myhre, OTR/L, CLT  Kamaile Zachow, OT 03/02/2022, 7:04 PM

## 2022-03-05 NOTE — Therapy (Signed)
OUTPATIENT PHYSICAL THERAPY SHOULDER EVALUATION   Patient Name: Gerald Bauer MRN: [id number] DOB:February 16, 1946, 76 y.o., male Today's Date: 03/06/2022  END OF SESSION:  PT End of Session - 03/06/22 1006     Visit Number 1    Number of Visits 24    Date for PT Re-Evaluation 05/29/22    PT Start Time 1003    PT Stop Time 1100    PT Time Calculation (min) 57 min    Activity Tolerance Patient tolerated treatment well    Behavior During Therapy [facility] for tasks assessed/performed             Past Medical History:  Diagnosis Date   Acid reflux    Arthritis    hands   Carotid atherosclerosis    Diabetes mellitus without complication ([place])    Ganglion cyst 02/23/2015   Hyperlipemia    Hypertension    Joint pain in fingers of right hand 02/23/2015   Dominant hand   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 07/11/2015   Prostate cancer ([place])    history   Stroke (cerebrum) ([place]) 10/06/2021   Tachycardia    Tinea pedis of both feet 01/10/2017   Past Surgical History:  Procedure Laterality Date   [place]  2013   PROSTATECTOMY  2012   Patient Active Problem List   Diagnosis Date Noted   H/O prostatectomy 02/06/2022   Hemiparesis, aphasia, and dysphagia as late effects of stroke ([place]) 11/07/2021   Lower urinary tract symptoms (LUTS) 11/07/2021   Myalgia due to statin 02/21/2021   Type 2 diabetes mellitus without complication, without long-term current use of insulin ([place]) 02/21/2021   Aortic atherosclerosis ([place]) 04/27/2020   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   Onychomycosis of multiple toenails with type 2 diabetes mellitus ([place]) 07/19/2016   Carotid stenosis 03/13/2016   Hyperlipidemia associated with type 2 diabetes mellitus ([place])    Hypertension associated with type 2 diabetes mellitus ([place])    Personal history of prostate cancer    Carotid atherosclerosis    GERD (gastroesophageal  reflux disease) 08/20/2013    PCP: [name], PA-C  REFERRING PROVIDER: Elyn Aquas. Harlow Mares, MD  REFERRING DIAG: M25.511 (ICD-10-CM) - Right shoulder pain   THERAPY DIAG:  Muscle weakness (generalized)  Chronic right shoulder pain  Decreased ROM of right shoulder  Rationale for Evaluation and Treatment: Rehabilitation  ONSET DATE: 10/2021  SUBJECTIVE:  SUBJECTIVE STATEMENT:  Pt known to clinic and therapist from prior sessions for his CVA.  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  Pt does best with pointing when sounding out words.    Pt to be seen for his R shoulder after the fall he had back around August.  Pt reports he landed on the R shoulder and has been in pain ever since.  Pt has been waiting on MD referral for PT and assess the potential pathologies that may be contributing to the pain that he is experiencing.  PERTINENT HISTORY:  Pt  is a 76 year old male with history of left-sided carotid stenosis, non-insulin-dependent diabetes mellitus type 2, PAD, GERD, mild COPD, history of tobacco use, hyperlipidemia, hypertension, who presents emergency department for chief concerns of facial droop, expressive aphasia, and R sided weakness   PAIN:  Are you having pain? Yes: NPRS scale: 5/10 Pain location: R shoulder, noting a deep pain Pain description: Sharp Aggravating factors: Moving the arm Relieving factors: Medication  PRECAUTIONS: None  WEIGHT BEARING RESTRICTIONS: No  FALLS:  Has patient fallen in last 6 months? Yes. Number of falls 2  LIVING ENVIRONMENT: Lives with: lives with their family Lives in: House/apartment Stairs: No Has following equipment at home: Single point  cane  OCCUPATION: Retired from [employer]  PLOF: Independent  PATIENT GOALS: Decrease pain  NEXT MD VISIT:   OBJECTIVE:   DIAGNOSTIC FINDINGS:  No imaging recorded within Epic, but pt reports he has had an X-Ray, but no MRI.  PATIENT SURVEYS:  FOTO 28/56  COGNITION: Overall cognitive status: Within functional limits for tasks assessed     SENSATION: WFL  POSTURE: Pt with fair posture, sitting on sacrum and forward rolled shoulders throughout the treatment time.  UPPER EXTREMITY ROM:   Active ROM Right eval Left eval  Shoulder flexion 79 125  Shoulder abduction 70 131  Shoulder adduction    Shoulder internal rotation    Shoulder external rotation    Elbow flexion    Elbow extension    (Blank rows = not tested)  UPPER EXTREMITY MMT:  MMT Right eval Left eval  Shoulder flexion 3+*  5  Shoulder extension    Shoulder abduction 4* 5  Shoulder adduction    Shoulder internal rotation    Shoulder external rotation    Middle trapezius    Lower trapezius    Elbow flexion    Elbow extension    Wrist flexion    Wrist extension    Wrist ulnar deviation    Wrist radial deviation    Wrist pronation    Wrist supination    Grip strength (lbs)    (Blank rows = not tested) * = pain  SHOULDER SPECIAL TESTS: Impingement tests: Neer impingement test: positive , Hawkins/Kennedy impingement test: positive , and Painful arc test: negative Rotator cuff assessment: Empty can test: positive , Full can test: positive , and Belly press test: positive  Biceps assessment: Yergason's test: negative  Pt reports significant pain with ER (Infraspinatus, Teres Minor),IR (Teres Major and Subscapularis), Flexion (Supraspinatus), Abduction (Supraspinatus), and Adduction (Teres Major and Minor) of the shoulder joint.  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  JOINT MOBILITY TESTING:  Pt with good mobility within the joints  PALPATION:   No significant pain noted when palpating.  Pt noted  the pain to be deeper than palpation can obtain.   TODAY'S TREATMENT:  DATE: 03/06/22   Established HEP and pt performed as part of therapy session and noted below:  Exercises - Standing Isometric Shoulder Flexion with Doorway, 2x10 - Standing Isometric Shoulder Extension with Doorway, 2x10 - Standing Isometric Shoulder Abduction with Doorway, 2x10 - Isometric Shoulder Adduction, 2x10  PATIENT EDUCATION: Education details: Pt educated throughout session about proper posture and technique with exercises. Improved exercise technique, movement at target joints, use of target muscles after min to mod verbal, visual, tactile cues Person educated: Patient Education method: Explanation, Demonstration, Tactile cues, Verbal cues, and Handouts Education comprehension: verbalized understanding, returned demonstration, and tactile cues required  HOME EXERCISE PROGRAM:  Access Code: [email or web address] URL: [email or web address] Date: 03/06/2022 Prepared by: [place]  Exercises - Standing Isometric Shoulder Flexion with Doorway - Arm Bent  - 1 x daily - 7 x weekly - 3 sets - 10 reps - Standing Isometric Shoulder Extension with Doorway - Arm Bent  - 1 x daily - 7 x weekly - 3 sets - 10 reps - Standing Isometric Shoulder Abduction with Doorway - Arm Bent  - 1 x daily - 7 x weekly - 3 sets - 10 reps - Isometric Shoulder Adduction  - 1 x daily - 7 x weekly - 3 sets - 10 reps  ASSESSMENT:  CLINICAL IMPRESSION: Patient is a 76 y.o. male who was seen today for physical therapy evaluation and treatment for R shoulder pain.  Pt presents with physical impairments of decreased activity tolerance, decreased ROM of R shoulder,  increased pain in R shoulder, and decreased strength in R shoulder due to pain mostly as noted above.  Based on special tests noted above, pt does have signs and symptoms that are indicative of rotator cuff pathology at this time.  Skilled therapy will be utilized to manage  pain and potentially strengthen the shoulder and improved ROM without increasing pain.  Pt will benefit from skilled therapy to address tolerance, ROM, pain, and strength impairments necessary for improvement in quality of life.  Pt. demonstrates understanding of this plan of care and agrees with this plan.   OBJECTIVE IMPAIRMENTS: decreased ROM, decreased strength, hypomobility, and pain.   ACTIVITY LIMITATIONS: carrying, lifting, bathing, toileting, dressing, reach over head, and hygiene/grooming  PARTICIPATION LIMITATIONS: cleaning, laundry, and yard work  PERSONAL FACTORS: Age, Fitness, Past/current experiences, and 1-2 comorbidities: hx of falls, CVA, diabetes  are also affecting patient's functional outcome.   REHAB POTENTIAL: Fair pt's prognosis may be limited due to the pathology that he is experiencing within the [place]: Stable/uncomplicated  EVALUATION COMPLEXITY: Low   GOALS: Goals reviewed with patient? Yes  SHORT TERM GOALS: Target date: 04/03/2022    Pt will be independent with HEP in order to demonstrate increased ability to perform tasks related to occupation/hobbies. Baseline:  Pt given HEP during evaluation Goal status: INITIAL  LONG TERM GOALS: Target date: 05/29/2022  Patient will demonstrate improved function as evidenced by a score of 56 on FOTO measure for full participation in activities at home and in the community. Baseline:  Goal status: INITIAL  2.  Pt to demonstrate R shoulder ROM to be within 10% of the L Shoulder (10% of L Shoulder Flexion: 113 deg; Abduction: 118 deg) Baseline: R shoulder flexion: 79 deg; abduction: 70 deg Goal status: INITIAL  3.  Pt to report L shoulder pain to be </=2/10 at rest in order to demonstrate a reduction in overall pain levels that pt is experiencing in the R UE. Baseline: 5/10  pain at rest Goal status: INITIAL  PLAN:  PT FREQUENCY: 2x/week  PT DURATION: 12 weeks  PLANNED INTERVENTIONS:  Therapeutic exercises, Therapeutic activity, Neuromuscular re-education, Balance training, Gait training, Patient/Family education, Self Care, Joint mobilization, Vestibular training, Canalith repositioning, DME instructions, Dry Needling, Electrical stimulation, Spinal mobilization, Cryotherapy, Moist heat, and Manual therapy  PLAN FOR NEXT SESSION: Assess compliance with HEP and reaction to it.  Continue with isometric exercises and manual therapy in order to address the pain and dysfunction he is experiencing.   Gwenlyn Saran, PT, DPT Physical Therapist- [facility]  03/07/22, 4:17 PM

## 2022-03-06 ENCOUNTER — Ambulatory Visit: Payer: Medicare Other | Admitting: Occupational Therapy

## 2022-03-06 ENCOUNTER — Ambulatory Visit: Payer: Medicare Other | Admitting: Speech Pathology

## 2022-03-06 ENCOUNTER — Ambulatory Visit: Payer: Medicare Other

## 2022-03-06 DIAGNOSIS — M25611 Stiffness of right shoulder, not elsewhere classified: Secondary | ICD-10-CM

## 2022-03-06 DIAGNOSIS — R4701 Aphasia: Secondary | ICD-10-CM

## 2022-03-06 DIAGNOSIS — G8929 Other chronic pain: Secondary | ICD-10-CM

## 2022-03-06 DIAGNOSIS — R482 Apraxia: Secondary | ICD-10-CM | POA: Diagnosis not present

## 2022-03-06 DIAGNOSIS — M6281 Muscle weakness (generalized): Secondary | ICD-10-CM

## 2022-03-06 DIAGNOSIS — R278 Other lack of coordination: Secondary | ICD-10-CM

## 2022-03-06 NOTE — Therapy (Signed)
OUTPATIENT SPEECH LANGUAGE PATHOLOGY TREATMENT NOTE   Patient Name: Gerald Bauer MRN: [id number] DOB:1945-09-01, 76 y.o., male Today's Date: 03/06/2022  PCP: [name], PA-C Referring Provider:  Bary Leriche, PA-C  END OF SESSION:   End of Session - 03/06/22 0903     Visit Number 30    Number of Visits 45    Date for SLP Re-Evaluation 04/25/22    SLP Start Time 0900    SLP Stop Time  1000    SLP Time Calculation (min) 60 min    Activity Tolerance Patient tolerated treatment well             Past Medical History:  Diagnosis Date   Acid reflux    Arthritis    hands   Carotid atherosclerosis    Diabetes mellitus without complication ([place])    Ganglion cyst 02/23/2015   Hyperlipemia    Hypertension    Joint pain in fingers of right hand 02/23/2015   Dominant hand   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 07/11/2015   Prostate cancer ([place])    history   Stroke (cerebrum) ([place]) 10/06/2021   Tachycardia    Tinea pedis of both feet 01/10/2017   Past Surgical History:  Procedure Laterality Date   [place]  2013   PROSTATECTOMY  2012   Patient Active Problem List   Diagnosis Date Noted   H/O prostatectomy 02/06/2022   Hemiparesis, aphasia, and dysphagia as late effects of stroke ([place]) 11/07/2021   Lower urinary tract symptoms (LUTS) 11/07/2021   Myalgia due to statin 02/21/2021   Type 2 diabetes mellitus without complication, without long-term current use of insulin ([place]) 02/21/2021   Aortic atherosclerosis ([place]) 04/27/2020   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   Onychomycosis of multiple toenails with type 2 diabetes mellitus ([place]) 07/19/2016   Carotid stenosis 03/13/2016   Hyperlipidemia associated with type 2 diabetes mellitus ([place])    Hypertension associated with type 2 diabetes mellitus ([place])    Personal history of prostate cancer    Carotid atherosclerosis    GERD  (gastroesophageal reflux disease) 08/20/2013    ONSET DATE: 10/05/2021    REFERRING DIAG: Cerebral infarction due to unspecified occlusion of left middle cerebral artery   THERAPY DIAG:  Verbal apraxia Verbal apraxia   Rationale for Evaluation and Treatment Rehabilitation   SUBJECTIVE STATEMENT:   "I... quiet."             Pt accompanied by: self   PERTINENT HISTORY: Pt  is a 76 year old male with history of left-sided carotid stenosis, non-insulin-dependent diabetes mellitus type 2, PAD, GERD, mild COPD, history of tobacco use, hyperlipidemia, hypertension, who presented 10/05/21 to [facility] ED with facial droop, expressive aphasia, and R sided weakness and found to have left MCA CVA. He was admitted to [facility] 7/17-10/31/21. Advanced to dysphagia 3/thin liquids by cup by time of d/c from CIR. 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 Difficult to fully assess cognition given severity of linguistic impairment; however, does present with decreased safety awareness, diminished problem-solving, emergent awareness, and mild impulsivity with movement."     DIAGNOSTIC FINDINGS: MBS 10/19/21: "Pt presents with overall mild oropharyngeal dysphagia. Oral phase c/b piecemeal deglutition, decreased mastication, decreased bolus cohesion, premature spillage, and weak lingual manipulation. Pharyngeal phase c/b reduced pharyngeal peristalsis + reduced BOT approximation to PPW,  which results in mild to moderate vallecular and pyriform sinuses residuals (vallecula > pyriform sinuses for solids, pyriform sinuses > vallecula for liquids). No penetration nor aspiration appreciated with thin liquid via cup, Dysphagia 1 (puree), Dysphagia 2 (chopped), Dysphagia 3 (mechanical soft), or with 13 mm Barium tablet placed in puree. Once instance of sensed aspiration with intake of large bolus of thin liquid via straw due to swallow  initiation delay to pyriform sinuses and mistiming of swallow-breath cycle; despite strong reflexive cough aspirant was not fully cleared from trachea. Pt's level of swallow initiation was noted to vary with liquid consumption (over base of epiglottis and at pyriform sinuses) with level of swallow initiation for solids to be consistent at the vallecula. Pharyngeal stasis was partially cleared with reflexive secondary swallow." MRI brain 10/05/21: 1. 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 No associated hemorrhage or significant regional mass effect. 2. Loss of normal flow voids throughout the left ICA and MCA, consistent with slow flow and/or occlusion. 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 3. Underlying mild chronic microvascular ischemic disease. Tiny remote left ACA distribution infarct.   PAIN:  Are you having pain? Yes, shoulder, 5/10   PATIENT GOALS talk better   OBJECTIVE:    TODAY'S TREATMENT:  Pt required min-mod cues to use script trained in prior sessions during initial greeting/conversation, for 90% intelligibility. Focused session on generating sentences in conversational turns with use of multimodal communication tools (writing, keywords, gestures) to provide clarification and resolve breakdowns. Intelligibility on first attempt for novel sentences (limited contextual knowledge) was ~40%, however with written support and reattempts with slow rate, louder speech, and tapping, intelligibility improves to 80% at sentence level.    PATIENT EDUCATION: Education details: point to keywords, how communication partners can support and cue for intelligibility Person educated: Patient Education method: Explanation, Demonstration, and written supports Education comprehension: verbalized understanding and needs further education     GOALS: Goals reviewed with patient? Yes   SHORT TERM GOALS: Target date:  10 sessions   Patient will participate in clinical assessment of swallow function with goals added as needed. Baseline: Goal status: MET   2.  Pt will communicate emergency information 100% accuracy using visual aid if necessary.  Baseline:  Goal status: MET   3.  Pt will approximate personally relevant words and phrases >80% accuracy using script training and min visual cues for apraxia.   Baseline:  Goal status: MET   4.  Pt will generate at least 4 descriptors of target word 80% of the time using semantic feature analysis to improve abilities in wordfinding and resolving communication breakdowns.  Baseline:  Goal status: MET   5.  Pt will complete HEP for dysphagia with rare min A. Baseline:  Goal status: MET   6.  Pt will generate sentences using personally relevant words list for >80% intelligibility. Baseline: 50% intelligibility Goal status: MET   7.  Pt will improve expressive language skills by taking 4-6 turns in simple conversation, with supported conversation strategies if needed. Baseline:  Goal status: MET 8.  Pt will generate sentences moderately complex sentences 8-10 words >80% intelligibility, with self-correction/multimodal communication allowed. Baseline: 50% intelligibility Goal status: MET   9.  Patient will use intelligibility strategies and script training for 4-6 turn telephone exchange with family member or friend. Baseline:  Goal status: MET  10.  *** Baseline:  Goal status: INITIAL   11.  *** Baseline:  Goal status: INITIAL  LONG TERM GOALS: Target date: 04/25/2022    Pt will engage in 5-8 minutes simple-mod complex conversation re: topic of interest with supported conversation, aphasia compensations.  Baseline: 01/25/22: 5 turn exchange. Goal renewed 01/25/22 Goal status: IN PROGRESS   2.  Pt will ID and attempt repair of communication breakdowns >90% of the time in session.    Baseline: 01/25/22: requires occasional mod cues to  initiate correction; renewed 01/25/22 Goal status: IN PROGRESS   3.  Pt/family will demonstrate knowledge of community resources and activities to support language/communication. Baseline: 01/25/22: referral completed to [facility]; pt has yet to register for group, renewed 01/25/22 Goal status: IN PROGRESS   4.  Pt will demonstrate use of swallowing precautions independently with s/sx aspiration <5% of trials. Baseline: 01/25/22: no overt s/sx when sipping water in sessions, pt and family report no issues at home with meals Goal status: DEFERRED       ASSESSMENT:   CLINICAL IMPRESSION: Patient presents with moderate Broca's aphasia, moderate verbal apraxia, and mild dysarthria. With encouragement, extended time and mod level cues, pt able to repair of breakdowns using multimodal means. Shows promise with script training to improve intelligibility for short interactions with family/community. Pt often attempts to generate sentence level responses in conversation, but requires cues to provide key words initially for context, loudness, and usual cues to slow rate at this level of discourse. I recommend skilled ST to improve pt's language and motor speech skills to improve communication and reduce frustration.    OBJECTIVE IMPAIRMENTS include expressive language, receptive language, aphasia, apraxia, dysarthria, and dysphagia. These impairments are limiting patient from managing medications, managing appointments, managing finances, household responsibilities, ADLs/IADLs, effectively communicating at home and in community, and safety when swallowing. Factors affecting potential to achieve goals and functional outcome are cooperation/participation level; pt appears highly motivated with good family support. Patient will benefit from skilled SLP services to address above impairments and improve overall function.   REHAB POTENTIAL: Excellent   PLAN: SLP FREQUENCY: 2x/week   SLP DURATION: 12 weeks    PLANNED INTERVENTIONS: Aspiration precaution training, Pharyngeal strengthening exercises, Diet toleration management , Language facilitation, Environmental controls, Trials of upgraded texture/liquids, Cognitive reorganization, Internal/external aids, Functional tasks, Multimodal communication approach, SLP instruction and feedback, Compensatory strategies, and Patient/family education  [name], MS, CCC-SLP Speech-Language Pathologist [phone]    [name], [place] 03/06/2022, 9:04 AM   [place] [address] [place], [place], [address] Phone: [phone]   Fax:  [phone]

## 2022-03-07 ENCOUNTER — Encounter: Payer: Self-pay | Admitting: Occupational Therapy

## 2022-03-07 NOTE — Therapy (Signed)
OUTPATIENT OCCUPATIONAL THERAPY NEURO TREATMENT  Patient Name: Gerald Bauer MRN: [id number] DOB:1945/07/25, 76 y.o., male Today's Date: 03/06/2022  PCP: Threasa Alpha, PA REFERRING PROVIDER: Reesa Chew    OT End of Session - 03/07/22 2032     Visit Number 28    Number of Visits 44    Date for OT Re-Evaluation 04/17/22    OT Start Time 82    OT Stop Time 1146    OT Time Calculation (min) 46 min    Activity Tolerance Patient tolerated treatment well    Behavior During Therapy [facility] for tasks assessed/performed                    Past Medical History:  Diagnosis Date   Acid reflux    Arthritis    hands   Carotid atherosclerosis    Diabetes mellitus without complication ([place])    Ganglion cyst 02/23/2015   Hyperlipemia    Hypertension    Joint pain in fingers of right hand 02/23/2015   Dominant hand   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 07/11/2015   Prostate cancer ([place])    history   Stroke (cerebrum) ([place]) 10/06/2021   Tachycardia    Tinea pedis of both feet 01/10/2017   Past Surgical History:  Procedure Laterality Date   [place]  2013   PROSTATECTOMY  2012   Patient Active Problem List   Diagnosis Date Noted   H/O prostatectomy 02/06/2022   Hemiparesis, aphasia, and dysphagia as late effects of stroke ([place]) 11/07/2021   Lower urinary tract symptoms (LUTS) 11/07/2021   Myalgia due to statin 02/21/2021   Type 2 diabetes mellitus without complication, without long-term current use of insulin ([place]) 02/21/2021   Aortic atherosclerosis ([place]) 04/27/2020   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   Onychomycosis of multiple toenails with type 2 diabetes mellitus ([place]) 07/19/2016   Carotid stenosis 03/13/2016   Hyperlipidemia associated with type 2 diabetes mellitus ([place])    Hypertension associated with type 2 diabetes mellitus ([place])    Personal history of prostate cancer    Carotid  atherosclerosis    GERD (gastroesophageal reflux disease) 08/20/2013    ONSET DATE: 10/05/21  REFERRING DIAG: L MCA CVA  THERAPY DIAG:  Muscle weakness (generalized)  Other lack of coordination  Chronic right shoulder pain  Decreased ROM of right shoulder  Rationale for Evaluation and Treatment Rehabilitation  SUBJECTIVE:    SUBJECTIVE STATEMENT:  Pt reports his daughter in law is at home today, had someone else bring him to therapy.  Reports he went to put flowers on his wife's grave last week and was able to arrange the flowers himself, showed therapist a picture.  Reports 5/10 pain on arrival and no pain at the end of session.   PERTINENT HISTORY: Per chart, [name] is a 76 year old right-handed male with history of hypertension, hyperlipidemia, diabetes mellitus, prostate cancer/prostatectomy 2012, quit smoking 24 years ago.  Presented [facility] 10/05/2021 with right side weakness/facial droop and expressive aphasia.  Blood pressure 155/102.  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  No associated hemorrhage or significant mass effect.    PAIN:  Are you having pain?  5/10 R shoulder; heat, rest, gentle stretch affective for pain management, no pain at end of session FALLS: Has patient fallen in last 6 months? Yes. Number of falls 3 since CVA  PLOF: Independent/retired Dealer  PATIENT GOALS : Pt points to his hand (wants to be able to use his hand)  OBJECTIVE:   HAND DOMINANCE: Right   FUNCTIONAL OUTCOME MEASURES: FOTO: 48  UUPPER EXTREMITY ROM      Active ROM Right eval Left Eval WNL Right 12/11/21 Right 01/23/2022  Shoulder flexion 90 (135)   95 (110) 95  Shoulder abduction 95 (110)   80 (90) 95  Wrist flex     54 60  Wrist ext     41 45    UPPER EXTREMITY MMT:      MMT Right eval Left Eval 5/5 Right 12/11/21  Shoulder flexion 3-   -3  Shoulder abduction 3-   -3  Shoulder adduction        Shoulder  extension        Shoulder internal rotation 3-      Shoulder external rotation 3-      Middle trapezius        Lower trapezius        Elbow flexion 4-   4  Elbow extension 4+   5  Wrist flexion 4   NT d/t pain  Wrist extension 4-   NT d/t pain  (Blank rows = not tested)   HAND FUNCTION: Eval:         Grip strength: Right: 14 lbs; Left: 58 lbs, Lateral pinch: Right: 7 lbs, Left: 19 lbs, and 3 point pinch: Right: 2 lbs, Left: 16 lbs 10th visit: Grip strength: Right: 16  lbs;                                Lateral pinch: Right: 9  lbs;                            3 point pinch: Right: 5 lbs 20th visit: Grip strength: Right: 20 lbs; Left: 70 lbs, Lateral pinch: Right: 10 lbs, Left: 20 lbs, and 3 point pinch: Right: 9 lbs, Left: 20 lbs    Eval: COORDINATION: Finger Nose Finger test: difficult/lacks precision with reaching targets 9 Hole Peg test: Right: unable sec; Left: 27 sec Able to oppose 2nd and 3rd digits to thumb on R hand   10th visit 9 hole Peg test: Right: 11 min, 44 sec   20th visit:  9 hole Peg test: Right: 3 min 6 sec    TODAY'S TREATMENT:   Manual Therapy:  Patient seen in sidelying for scapular mobilization, for elevation, retraction, and upward rotation by therapist, followed by active assistive range of motion for the same movement patterns. Joint mobilizations to posterior and inferior joint capsule at shoulder, grade II to decrease pain and increase motion.     Therapeutic Exercise:  Following manual therapy, patient was seen for passive range of motion of right shoulder, shoulder flexion to 90 degrees, abduction to 90, elbow, forearm, wrist, and hand.    In supine, pt seen for facilitation of right shoulder flexion to 90 of motion with place and hold for 10 repetitions, 2 sets. Place and hold with external perturbations for 10 repetitions, followed by small, circular motions in both directions with arm maintained at 90 of flexion, then performing crosses/ plus signs  with arm, added ABCs with arm in 90 degrees of shoulder flexion.  1# dowel exercises for chest press, forwards/backwards circles and ABD/ADD for 10 reps for 1 set  with therapist demonstration and cues for proper form and technique.  Ball exercises for chest press, alternating shoulder touch, right and left.      Neuromuscular re-education: Manipulation of coins in sitting from flat tabletop surface, occasional cues for prehension patterns with attempts to pick up and place coins into resistive bank with right hand.  Cues for patient to use translatory skills of the hand and move coins to palm and back to fingertips to place into bank.  Pain slightly increased with reaching patterns and decreases at rest.   Pt seen for manipulation of 1 inch square blocks to recreate designs from picture card.  Pt able to complete 4 designs, difficulty with one of the designs and required assist to correct.        PATIENT EDUCATION: Education details: HEP progression, Engineer, production Person educated: pt Education method: Explanation and Verbal cues, handout Education comprehension: verbalized understanding, returned demo   HOME EXERCISE PROGRAM: Theraputty, hand writing skills, table slides  GOALS: Goals reviewed with patient? Yes   SHORT TERM GOALS: Target date: /23   Pt will be indep to perform HEP for increasing strength and coordination throughout RUE.  Baseline: Not yet initiated; 10th: putty given but pt reports limited use, instructed in table slides and self PROM for R wrist/digits. 20th:  Pt performing exercises but requires continual upgrades and instruction  Goal status: ongoing   2.  Pt will manage clothing fasteners with extra time, using RUE as an assist  Baseline: unable; pt has elastic laces and wears elastic waisted pants; 10th: pt uses R hand as an assist to zip and button jeans with extra time; not yet tried small buttons on a shirt.  Pt continues to use elastic laces on shoes. 20th:   Improving with manipulation skills but still using elastic laces, able to button pants and shirt with increased time and effort.  Goal status: ongoing   LONG TERM GOALS: Target date: /23   Pt will increase FOTO score to 55 or better to indicate improved performance with daily tasks.  Baseline: 48; 10th: 48; 20th: score:42  Goal status: ongoing   2.  Pt will increase R grip strength by 10 or more lbs to enable pt to hold and carry light ADL supplies without dropping.  Baseline: R grip 14#, L 58#; 10th visit: R grip 16; 20th visit: 20# Goal status: ongoing   3.  Pt will increase RUE strength to be able to engage RUE into ADLs at least 50% of the time. Baseline: Pt using L non-dominant arm to manage ADLs; 10th visit: Son reports he constantly sees pt attempt to use his R arm for daily tasks, but continues to be limited and still must use LUE for most tasks (see chart for RUE MMT) 20th:  continues to improve on strength and is working on consistency of use  Goal status: ongoing   4.  Pt will complete 9 hole peg test on the R in 3 min or less to work towards ability to use R hand to pick up small ADL supplies.  Baseline: Pt can remove a peg but can not pick one up; 10th visit: R 9 hole completion 11 min 44 sec; 20th visit:   Pt improved to 3 mins and 6 secs, nearing goal.  Goal status: ongoing    ASSESSMENT:  CLINICAL IMPRESSION: Pt with pain 5/10 at the beginning of session in right shoulder, responds well to manual therapy techniques and exercises and reported no pain by  end of session.  Pt demonstrating improvements in ROM, strength and control of right UE, continues to have pain when moving greater than 90 degrees of shoulder flexion.  Hand function continues to improve and able to manipulate coins this date and place into resistive bank with dropping items occasionally.  Continue to work towards goals in plan of care to maximize safety and independence in necessary daily tasks and reduce  caregiver burden.      PERFORMANCE DEFICITS in functional skills including ADLs, IADLs, coordination, dexterity, ROM, strength, pain, flexibility, FMC, GMC, mobility, balance, continence, decreased knowledge of use of DME, and UE functional use, cognitive skills including safety awareness, and psychosocial skills including.   IMPAIRMENTS are limiting patient from ADLs, IADLs, leisure, and social participation.   COMORBIDITIES may have co-morbidities  that affects occupational performance. Patient will benefit from skilled OT to address above impairments and improve overall function.  MODIFICATION OR ASSISTANCE TO COMPLETE EVALUATION: Min-Moderate modification of tasks or assist with assess necessary to complete an evaluation.  OT OCCUPATIONAL PROFILE AND HISTORY: Problem focused assessment: Including review of records relating to presenting problem.  CLINICAL DECISION MAKING: Moderate - several treatment options, min-mod task modification necessary  REHAB POTENTIAL: Good  EVALUATION COMPLEXITY: Moderate    PLAN: OT FREQUENCY: 2x/week  OT DURATION: 12 weeks  PLANNED INTERVENTIONS: self care/ADL training, therapeutic exercise, therapeutic activity, neuromuscular re-education, manual therapy, passive range of motion, balance training, functional mobility training, moist heat, cryotherapy, patient/family education, cognitive remediation/compensation, energy conservation, coping strategies training, and DME and/or AE instructions  RECOMMENDED OTHER SERVICES: N/A  CONSULTED AND AGREED WITH PLAN OF CARE: Patient and family member/caregiver  PLAN FOR NEXT SESSION: HEP progression, neuro re-ed, therapeutic exercises  Aya Geisel T Francheska Villeda, OTR/L, CLT  Manjot Hinks, OT 03/08/2022, 10:40 AM

## 2022-03-08 ENCOUNTER — Ambulatory Visit: Payer: Medicare Other

## 2022-03-08 ENCOUNTER — Ambulatory Visit: Payer: Medicare Other | Admitting: Dermatology

## 2022-03-08 ENCOUNTER — Ambulatory Visit: Payer: Medicare Other | Admitting: Occupational Therapy

## 2022-03-08 VITALS — BP 144/89 | HR 97

## 2022-03-08 DIAGNOSIS — L821 Other seborrheic keratosis: Secondary | ICD-10-CM

## 2022-03-08 DIAGNOSIS — L578 Other skin changes due to chronic exposure to nonionizing radiation: Secondary | ICD-10-CM

## 2022-03-08 DIAGNOSIS — L820 Inflamed seborrheic keratosis: Secondary | ICD-10-CM | POA: Diagnosis not present

## 2022-03-08 DIAGNOSIS — L814 Other melanin hyperpigmentation: Secondary | ICD-10-CM | POA: Diagnosis not present

## 2022-03-08 NOTE — Patient Instructions (Addendum)

## 2022-03-08 NOTE — Progress Notes (Signed)
   New Patient Visit  Subjective  Gerald Bauer is a 76 y.o. male who presents for the following: check spots (Face, long hx, itchy, no hx of skin cancer). The patient has spots, moles and lesions to be evaluated, some may be new or changing and the patient has concerns that these could be cancer. New patient referral from [name], PA-C. Patient accompanied by daughter in law Gerald Bauer.  The following portions of the chart were reviewed this encounter and updated as appropriate:   Tobacco  Allergies  Meds  Problems  Med Hx  Surg Hx  Fam Hx     Review of Systems:  No other skin or systemic complaints except as noted in HPI or Assessment and Plan.  Objective  Well appearing patient in no apparent distress; mood and affect are within normal limits.  A focused examination was performed including face, arms, ears. Relevant physical exam findings are noted in the Assessment and Plan.  L forearm x 1, L temple x 2 (3) Stuck on waxy paps with erythema   Assessment & Plan   Actinic Damage - chronic, secondary to cumulative UV radiation exposure/sun exposure over time - diffuse scaly erythematous macules with underlying dyspigmentation - Recommend daily broad spectrum sunscreen SPF 30+ to sun-exposed areas, reapply every 2 hours as needed.  - Recommend staying in the shade or wearing long sleeves, sun glasses (UVA+UVB protection) and wide brim hats (4-inch brim around the entire circumference of the hat). - Call for new or changing lesions.   Inflamed seborrheic keratosis (3) L forearm x 1, L temple x 2 Symptomatic, irritating, patient would like treated. Destruction of lesion - L forearm x 1, L temple x 2 Complexity: simple   Destruction method: cryotherapy   Informed consent: discussed and consent obtained   Timeout:  patient name, date of birth, surgical site, and procedure verified Lesion destroyed using liquid nitrogen: Yes   Region frozen until ice ball extended beyond  lesion: Yes   Outcome: patient tolerated procedure well with no complications   Post-procedure details: wound care instructions given    Seborrheic Keratoses - Stuck-on, waxy, tan-brown papules and/or plaques  - Benign-appearing - Discussed benign etiology and prognosis. - Observe - Call for any changes - face, arms  Lentigines - Scattered tan macules - Due to sun exposure - Benign-appearing, observe - Recommend daily broad spectrum sunscreen SPF 30+ to sun-exposed areas, reapply every 2 hours as needed. - Call for any changes  - arms  Return if symptoms worsen or fail to improve.  I, Othelia Pulling, RMA, am acting as scribe for Sarina Ser, MD . Documentation: I have reviewed the above documentation for accuracy and completeness, and I agree with the above.  Sarina Ser, MD

## 2022-03-09 ENCOUNTER — Ambulatory Visit: Payer: Medicare Other | Admitting: Nurse Practitioner

## 2022-03-09 LAB — HM DIABETES EYE EXAM

## 2022-03-13 ENCOUNTER — Ambulatory Visit: Payer: Medicare Other

## 2022-03-13 ENCOUNTER — Ambulatory Visit: Payer: Medicare Other | Admitting: Speech Pathology

## 2022-03-13 ENCOUNTER — Ambulatory Visit: Payer: Medicare Other | Admitting: Occupational Therapy

## 2022-03-13 DIAGNOSIS — R482 Apraxia: Secondary | ICD-10-CM

## 2022-03-13 DIAGNOSIS — M6281 Muscle weakness (generalized): Secondary | ICD-10-CM

## 2022-03-13 DIAGNOSIS — R4701 Aphasia: Secondary | ICD-10-CM

## 2022-03-13 DIAGNOSIS — M25611 Stiffness of right shoulder, not elsewhere classified: Secondary | ICD-10-CM

## 2022-03-13 DIAGNOSIS — G8929 Other chronic pain: Secondary | ICD-10-CM

## 2022-03-13 DIAGNOSIS — R278 Other lack of coordination: Secondary | ICD-10-CM

## 2022-03-13 NOTE — Therapy (Signed)
OUTPATIENT PHYSICAL THERAPY SHOULDER TREATMENT   Patient Name: Gerald Bauer MRN: [id number] DOB:05/15/1945, 76 y.o., male Today's Date: 03/13/2022  END OF SESSION:  PT End of Session - 03/13/22 1026     Visit Number 2    Number of Visits 24    Date for PT Re-Evaluation 05/29/22    PT Start Time 1018    PT Stop Time 1100    PT Time Calculation (min) 42 min    Activity Tolerance Patient tolerated treatment well    Behavior During Therapy [facility] for tasks assessed/performed             Past Medical History:  Diagnosis Date   Acid reflux    Arthritis    hands   Carotid atherosclerosis    Diabetes mellitus without complication ([place])    Ganglion cyst 02/23/2015   Hyperlipemia    Hypertension    Joint pain in fingers of right hand 02/23/2015   Dominant hand   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 07/11/2015   Prostate cancer ([place])    history   Stroke (cerebrum) ([place]) 10/06/2021   Tachycardia    Tinea pedis of both feet 01/10/2017   Past Surgical History:  Procedure Laterality Date   [place]  2013   PROSTATECTOMY  2012   Patient Active Problem List   Diagnosis Date Noted   H/O prostatectomy 02/06/2022   Hemiparesis, aphasia, and dysphagia as late effects of stroke ([place]) 11/07/2021   Lower urinary tract symptoms (LUTS) 11/07/2021   Myalgia due to statin 02/21/2021   Type 2 diabetes mellitus without complication, without long-term current use of insulin ([place]) 02/21/2021   Aortic atherosclerosis ([place]) 04/27/2020   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 Harlow Mares, MD  REFERRING DIAG: M25.511 (ICD-10-CM) - Right shoulder pain   THERAPY DIAG:  Muscle weakness (generalized)  Other lack of coordination  Chronic right shoulder pain  Decreased ROM of right shoulder  Rationale for Evaluation and Treatment: Rehabilitation  ONSET DATE: 10/2021  SUBJECTIVE:  SUBJECTIVE STATEMENT:  Pt is performing well with isometric exercises at home and notes no increased pain with the exercises.    PERTINENT HISTORY:  Pt  is a 76 year old male with history of left-sided carotid stenosis, non-insulin-dependent diabetes mellitus type 2, PAD, GERD, mild COPD, history of tobacco use, hyperlipidemia, hypertension, who presents emergency department for chief concerns of facial droop, expressive aphasia, and R sided weakness   PAIN:  Are you having pain? Yes: NPRS scale: 5/10 Pain location: R shoulder, noting a deep pain Pain description: Sharp Aggravating factors: Moving the arm Relieving factors: Medication  PRECAUTIONS: None  WEIGHT BEARING RESTRICTIONS: No  FALLS:  Has patient fallen in last 6 months? Yes. Number of falls 2  LIVING ENVIRONMENT: Lives with: lives with their family Lives in: House/apartment Stairs: No Has following equipment at home: Single point cane  OCCUPATION: Retired from [employer]  PLOF: Independent  PATIENT GOALS: Decrease pain  NEXT MD VISIT:   OBJECTIVE:   DIAGNOSTIC FINDINGS:  No imaging recorded within Epic, but pt reports he has had an X-Ray, but no MRI.  PATIENT SURVEYS:  FOTO 28/56  COGNITION: Overall cognitive status: Within functional limits for tasks assessed     SENSATION: WFL  POSTURE: Pt with fair posture, sitting on sacrum and  forward rolled shoulders throughout the treatment time.  UPPER EXTREMITY ROM:   Active ROM Right eval Left eval  Shoulder flexion 79 125  Shoulder abduction 70 131  Shoulder adduction    Shoulder internal rotation    Shoulder external rotation    Elbow flexion    Elbow extension    (Blank rows = not tested)  UPPER EXTREMITY MMT:  MMT Right eval Left eval  Shoulder flexion 3+*  5  Shoulder extension    Shoulder abduction 4* 5  Shoulder adduction    Shoulder internal rotation    Shoulder external rotation    Middle trapezius    Lower trapezius    Elbow flexion    Elbow extension    Wrist flexion    Wrist extension    Wrist ulnar deviation    Wrist radial deviation    Wrist pronation    Wrist supination    Grip strength (lbs)    (Blank rows = not tested) * = pain  SHOULDER SPECIAL TESTS: Impingement tests: Neer impingement test: positive , Hawkins/Kennedy impingement test: positive , and Painful arc test: negative Rotator cuff assessment: Empty can test: positive , Full can test: positive , and Belly press test: positive  Biceps assessment: Yergason's test: negative  Pt reports significant pain with ER (Infraspinatus, Teres Minor),IR (Teres Major and Subscapularis), Flexion (Supraspinatus), Abduction (Supraspinatus), and Adduction (Teres Major and Minor) of the shoulder joint.  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  JOINT MOBILITY TESTING:  Pt with good mobility within the joints  PALPATION:   No significant pain noted when palpating.  Pt noted the pain to be deeper than palpation can obtain.   TODAY'S TREATMENT:  DATE: 03/13/22  TherEx:  Seated SciFit arm ergometer, seat #3, 2.5 min forward/2.5 min backward, level 4, pt denies any pain with exercise Standing wall climbs in flexion and abduction Seated scapular retractions with GTB, 2x15 Standing wall slides with use of washcloth at door, flexion/abduction, 2x10 each direction Standing wall circles with use of  the washcloth at door, CW/CCW, 30 sec bouts Standing isometrics in all planes of mobility, 2x10 each direction Standing pendulums with 5# dumbbell, lateral/forward/backward/CW circles/CCW circles, x10 each direction    PATIENT EDUCATION:  Education details: Pt educated throughout session about proper posture and technique with exercises. Improved exercise technique, movement at target joints, use of target muscles after min to mod verbal, visual, tactile cues Person educated: Patient Education method: Explanation, Demonstration, Tactile cues, Verbal cues, and Handouts Education comprehension: verbalized understanding, returned demonstration, and tactile cues required  HOME EXERCISE PROGRAM:  Access Code: [email or web address] URL: [email or web address] Date: 03/06/2022 Prepared by: Roslyn Nation  Exercises - Standing Isometric Shoulder Flexion with Doorway - Arm Bent  - 1 x daily - 7 x weekly - 3 sets - 10 reps - Standing Isometric Shoulder Extension with Doorway - Arm Bent  - 1 x daily - 7 x weekly - 3 sets - 10 reps - Standing Isometric Shoulder Abduction with Doorway - Arm Bent  - 1 x daily - 7 x weekly - 3 sets - 10 reps - Isometric Shoulder Adduction  - 1 x daily - 7 x weekly - 3 sets - 10 reps  ASSESSMENT:  CLINICAL IMPRESSION:  Pt performed well the exercises given today noting some increased pain with the ladder walks and advised to only go to the pain tolerable range.  Pt understood and performed with the advice given during each subsequent exercise.  Pt to benefit from manual therapy next session in order to improve ROM while decreasing overall pain in the shoulder.   Pt will continue to benefit from skilled therapy to address remaining deficits in order to improve overall QoL and return to PLOF.      OBJECTIVE IMPAIRMENTS: decreased ROM, decreased strength, hypomobility, and pain.   ACTIVITY LIMITATIONS: carrying, lifting, bathing, toileting, dressing, reach over head,  and hygiene/grooming  PARTICIPATION LIMITATIONS: cleaning, laundry, and yard work  PERSONAL FACTORS: Age, Fitness, Past/current experiences, and 1-2 comorbidities: hx of falls, CVA, diabetes  are also affecting patient's functional outcome.   REHAB POTENTIAL: Fair pt's prognosis may be limited due to the pathology that he is experiencing within the [place]: Stable/uncomplicated  EVALUATION COMPLEXITY: Low   GOALS: Goals reviewed with patient? Yes  SHORT TERM GOALS: Target date: 04/03/2022    Pt will be independent with HEP in order to demonstrate increased ability to perform tasks related to occupation/hobbies. Baseline:  Pt given HEP during evaluation Goal status: INITIAL  LONG TERM GOALS: Target date: 05/29/2022  Patient will demonstrate improved function as evidenced by a score of 56 on FOTO measure for full participation in activities at home and in the community. Baseline:  Goal status: INITIAL  2.  Pt to demonstrate R shoulder ROM to be within 10% of the L Shoulder (10% of L Shoulder Flexion: 113 deg; Abduction: 118 deg) Baseline: R shoulder flexion: 79 deg; abduction: 70 deg Goal status: INITIAL  3.  Pt to report L shoulder pain to be </=2/10 at rest in order to demonstrate a reduction in overall pain levels that pt is experiencing in the R UE. Baseline: 5/10 pain at rest Goal status: INITIAL  PLAN:  PT FREQUENCY: 2x/week  PT DURATION: 12 weeks  PLANNED INTERVENTIONS: Therapeutic exercises, Therapeutic activity, Neuromuscular re-education, Balance training, Gait training, Patient/Family education, Self Care, Joint mobilization, Vestibular training, Canalith repositioning, DME instructions, Dry Needling, Electrical stimulation, Spinal mobilization, Cryotherapy, Moist heat, and Manual therapy  PLAN FOR NEXT SESSION: Assess compliance with HEP and reaction to it.  Continue with isometric exercises and manual therapy in order to address the pain and  dysfunction he is experiencing.   Gwenlyn Saran, PT, DPT Physical Therapist- [place]  [facility]  03/13/22, 10:27 AM

## 2022-03-13 NOTE — Therapy (Signed)
OUTPATIENT OCCUPATIONAL THERAPY NEURO TREATMENT  Patient Name: Gerald Bauer MRN: [id number] DOB:1945/05/16, 76 y.o., male Today's Date: 03/06/2022  PCP: Threasa Alpha, PA REFERRING PROVIDER: Reesa Chew    OT End of Session - 03/07/22 2032     Visit Number 28    Number of Visits 44    Date for OT Re-Evaluation 04/17/22    OT Start Time 15    OT Stop Time 1146    OT Time Calculation (min) 46 min    Activity Tolerance Patient tolerated treatment well    Behavior During Therapy [facility] for tasks assessed/performed                    Past Medical History:  Diagnosis Date   Acid reflux    Arthritis    hands   Carotid atherosclerosis    Diabetes mellitus without complication ([place])    Ganglion cyst 02/23/2015   Hyperlipemia    Hypertension    Joint pain in fingers of right hand 02/23/2015   Dominant hand   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 07/11/2015   Prostate cancer ([place])    history   Stroke (cerebrum) ([place]) 10/06/2021   Tachycardia    Tinea pedis of both feet 01/10/2017   Past Surgical History:  Procedure Laterality Date   [place]  2013   PROSTATECTOMY  2012   Patient Active Problem List   Diagnosis Date Noted   H/O prostatectomy 02/06/2022   Hemiparesis, aphasia, and dysphagia as late effects of stroke ([place]) 11/07/2021   Lower urinary tract symptoms (LUTS) 11/07/2021   Myalgia due to statin 02/21/2021   Type 2 diabetes mellitus without complication, without long-term current use of insulin ([place]) 02/21/2021   Aortic atherosclerosis ([place]) 04/27/2020   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   Onychomycosis of multiple toenails with type 2 diabetes mellitus ([place]) 07/19/2016   Carotid stenosis 03/13/2016   Hyperlipidemia associated with type 2 diabetes mellitus ([place])    Hypertension associated with type 2 diabetes mellitus ([place])    Personal history of prostate cancer    Carotid  atherosclerosis    GERD (gastroesophageal reflux disease) 08/20/2013    ONSET DATE: 10/05/21  REFERRING DIAG: L MCA CVA  THERAPY DIAG:  Muscle weakness (generalized)  Other lack of coordination  Chronic right shoulder pain  Decreased ROM of right shoulder  Rationale for Evaluation and Treatment Rehabilitation  SUBJECTIVE:    SUBJECTIVE STATEMENT:  Pt reports his daughter in law is at home today, had someone else bring him to therapy.  Reports he went to put flowers on his wife's grave last week and was able to arrange the flowers himself, showed therapist a picture.  Reports 5/10 pain on arrival and no pain at the end of session.   PERTINENT HISTORY: Per chart, Ayden Hardwick is a 76 year old right-handed male with history of hypertension, hyperlipidemia, diabetes mellitus, prostate cancer/prostatectomy 2012, quit smoking 24 years ago.  [facility]20[facility] with right side weakness/facial droop and expressive aphasia.  Blood pressure 155/102.  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  No associated hemorrhage or significant mass effect.    PAIN:  Are you having pain?  5/10 R shoulder; heat, rest, gentle stretch affective for pain management, no pain at end of session FALLS: Has patient fallen in last 6 months? Yes. Number of falls 3 since CVA  PLOF: Independent/retired Dealer  PATIENT GOALS : Pt points to his hand (wants to be able to use his hand)  OBJECTIVE:   HAND DOMINANCE: Right   FUNCTIONAL OUTCOME MEASURES: FOTO: 48  UUPPER EXTREMITY ROM      Active ROM Right eval Left Eval WNL Right 12/11/21 Right 01/23/2022  Shoulder flexion 90 (135)   95 (110) 95  Shoulder abduction 95 (110)   80 (90) 95  Wrist flex     54 60  Wrist ext     41 45    UPPER EXTREMITY MMT:      MMT Right eval Left Eval 5/5 Right 12/11/21  Shoulder flexion 3-   -3  Shoulder abduction 3-   -3  Shoulder adduction        Shoulder  extension        Shoulder internal rotation 3-      Shoulder external rotation 3-      Middle trapezius        Lower trapezius        Elbow flexion 4-   4  Elbow extension 4+   5  Wrist flexion 4   NT d/t pain  Wrist extension 4-   NT d/t pain  (Blank rows = not tested)   HAND FUNCTION: Eval:         Grip strength: Right: 14 lbs; Left: 58 lbs, Lateral pinch: Right: 7 lbs, Left: 19 lbs, and 3 point pinch: Right: 2 lbs, Left: 16 lbs 10th visit: Grip strength: Right: 16  lbs;                                Lateral pinch: Right: 9  lbs;                            3 point pinch: Right: 5 lbs 20th visit: Grip strength: Right: 20 lbs; Left: 70 lbs, Lateral pinch: Right: 10 lbs, Left: 20 lbs, and 3 point pinch: Right: 9 lbs, Left: 20 lbs    Eval: COORDINATION: Finger Nose Finger test: difficult/lacks precision with reaching targets 9 Hole Peg test: Right: unable sec; Left: 27 sec Able to oppose 2nd and 3rd digits to thumb on R hand   10th visit 9 hole Peg test: Right: 11 min, 44 sec   20th visit:  9 hole Peg test: Right: 3 min 6 sec    TODAY'S TREATMENT:   Manual Therapy:    Pt. Tolerated manual therapy for scapular elevation, depression, abduction/rotation 2/ tightness to normalize tone, and prepare the UE for ROM. Manual therapy was performed independent of, and in preparation for ROM  Therapeutic Exercise: Following manual therapy, patient was seen for passive range of motion of right shoulder, shoulder flexion to 90 degrees, abduction to 90, elbow, forearm, wrist, and hand.    In supine, Pt. seen for facilitation of right shoulder flexion to 90 with elbow straight. Pt. performed right shoulder protraction, circular motion, followed by medium sized circles. Pt. worked on AROM for left shoulder abduction , followed by PROM to the end range.     Neuromuscular re-education:  Pt. Performed right hand [facility] skills, with the manipulation of 1 inch square blocks to complete one design from  a picture card. Pt. worked turning to cubes within his right fingers, translatory movements moving the cubes through his hand, and stacking the cubes in a series of  9 cubes.      PATIENT EDUCATION: Education details: HEP progression, Engineer, production Person educated: pt Education method: Explanation and Verbal cues, handout Education comprehension: verbalized understanding, returned demo   HOME EXERCISE PROGRAM: Theraputty, hand writing skills, table slides  GOALS: Goals reviewed with patient? Yes   SHORT TERM GOALS: Target date: /23   Pt will be indep to perform HEP for increasing strength and coordination throughout RUE.  Baseline: Not yet initiated; 10th: putty given but pt reports limited use, instructed in table slides and self PROM for R wrist/digits. 20th:  Pt performing exercises but requires continual upgrades and instruction  Goal status: ongoing   2.  Pt will manage clothing fasteners with extra time, using RUE as an assist  Baseline: unable; pt has elastic laces and wears elastic waisted pants; 10th: pt uses R hand as an assist to zip and button jeans with extra time; not yet tried small buttons on a shirt.  Pt continues to use elastic laces on shoes. 20th:  Improving with manipulation skills but still using elastic laces, able to button pants and shirt with increased time and effort.  Goal status: ongoing   LONG TERM GOALS: Target date: /23   Pt will increase FOTO score to 55 or better to indicate improved performance with daily tasks.  Baseline: 48; 10th: 48; 20th: score:42  Goal status: ongoing   2.  Pt will increase R grip strength by 10 or more lbs to enable pt to hold and carry light ADL supplies without dropping.  Baseline: R grip 14#, L 58#; 10th visit: R grip 16; 20th visit: 20# Goal status: ongoing   3.  Pt will increase RUE strength to be able to engage RUE into ADLs at least 50% of the time. Baseline: Pt using L non-dominant arm to manage ADLs; 10th  visit: Son reports he constantly sees pt attempt to use his R arm for daily tasks, but continues to be limited and still must use LUE for most tasks (see chart for RUE MMT) 20th:  continues to improve on strength and is working on consistency of use  Goal status: ongoing   4.  Pt will complete 9 hole peg test on the R in 3 min or less to work towards ability to use R hand to pick up small ADL supplies.  Baseline: Pt can remove a peg but can not pick one up; 10th visit: R 9 hole completion 11 min 44 sec; 20th visit:   Pt improved to 3 mins and 6 secs, nearing goal.  Goal status: ongoing    ASSESSMENT:  CLINICAL IMPRESSION:  Pt. continues to present with pain 5/10 at the beginning of session in right shoulder. However responded well to ROM.  Pt. Continues to demonstrate improvements in ROM, strength and control of right UE, continues to have pain when moving greater than 90 degrees of shoulder flexion.  Pt. Was able to turn the cubes in his right hand, and move them through his hand prior to stacking them. Pt. Required increased time, and assist to find the accurate side of the cube in preparation for piecing the puzzle together. Pt. Continues to work towards goals in plan of care to maximize safety and independence in necessary daily tasks and reduce caregiver burden.      PERFORMANCE DEFICITS in functional skills including ADLs, IADLs, coordination, dexterity, ROM, strength, pain, flexibility, FMC, GMC, mobility, balance, continence, decreased knowledge of use of DME, and UE functional use, cognitive skills including safety  awareness, and psychosocial skills including.   IMPAIRMENTS are limiting patient from ADLs, IADLs, leisure, and social participation.   COMORBIDITIES may have co-morbidities  that affects occupational performance. Patient will benefit from skilled OT to address above impairments and improve overall function.  MODIFICATION OR ASSISTANCE TO COMPLETE EVALUATION: Min-Moderate  modification of tasks or assist with assess necessary to complete an evaluation.  OT OCCUPATIONAL PROFILE AND HISTORY: Problem focused assessment: Including review of records relating to presenting problem.  CLINICAL DECISION MAKING: Moderate - several treatment options, min-mod task modification necessary  REHAB POTENTIAL: Good  EVALUATION COMPLEXITY: Moderate    PLAN: OT FREQUENCY: 2x/week  OT DURATION: 12 weeks  PLANNED INTERVENTIONS: self care/ADL training, therapeutic exercise, therapeutic activity, neuromuscular re-education, manual therapy, passive range of motion, balance training, functional mobility training, moist heat, cryotherapy, patient/family education, cognitive remediation/compensation, energy conservation, coping strategies training, and DME and/or AE instructions  RECOMMENDED OTHER SERVICES: N/A  CONSULTED AND AGREED WITH PLAN OF CARE: Patient and family member/caregiver  PLAN FOR NEXT SESSION: HEP progression, neuro re-ed, therapeutic exercises  Harrel Carina, MS, OTR/L

## 2022-03-14 NOTE — Therapy (Signed)
OUTPATIENT SPEECH LANGUAGE PATHOLOGY TREATMENT   Patient Name: Gerald Bauer MRN: [id number] DOB:04-22-1945, 76 y.o., male Today's Date: 03/14/2022  PCP: [name], PA-C Referring Provider:  [name], PA-C   END OF SESSION:   End of Session - 03/14/22 1738     Visit Number 31    Number of Visits 45    Date for SLP Re-Evaluation 04/25/22    SLP Start Time 0900    SLP Stop Time  1000    SLP Time Calculation (min) 60 min    Activity Tolerance Patient tolerated treatment well             Past Medical History:  Diagnosis Date   Acid reflux    Arthritis    hands   Carotid atherosclerosis    Diabetes mellitus without complication ([place])    Ganglion cyst 02/23/2015   Hyperlipemia    Hypertension    Joint pain in fingers of right hand 02/23/2015   Dominant hand   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 07/11/2015   Prostate cancer ([place])    history   Stroke (cerebrum) ([place]) 10/06/2021   Tachycardia    Tinea pedis of both feet 01/10/2017   Past Surgical History:  Procedure Laterality Date   [place]  2013   PROSTATECTOMY  2012   Patient Active Problem List   Diagnosis Date Noted   H/O prostatectomy 02/06/2022   Hemiparesis, aphasia, and dysphagia as late effects of stroke ([place]) 11/07/2021   Lower urinary tract symptoms (LUTS) 11/07/2021   Myalgia due to statin 02/21/2021   Type 2 diabetes mellitus without complication, without long-term current use of insulin ([place]) 02/21/2021   Aortic atherosclerosis ([place]) 04/27/2020   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   Onychomycosis of multiple toenails with type 2 diabetes mellitus ([place]) 07/19/2016   Carotid stenosis 03/13/2016   Hyperlipidemia associated with type 2 diabetes mellitus ([place])    Hypertension associated with type 2 diabetes mellitus ([place])    Personal history of prostate cancer    Carotid atherosclerosis    GERD  (gastroesophageal reflux disease) 08/20/2013    ONSET DATE: 10/05/2021    REFERRING DIAG: Cerebral infarction due to unspecified occlusion of left middle cerebral artery   THERAPY DIAG:  Verbal apraxia Verbal apraxia   Rationale for Evaluation and Treatment Rehabilitation   SUBJECTIVE STATEMENT:   "I... quiet."             Pt accompanied by: self   PERTINENT HISTORY: Pt  is a 76 year old male with history of left-sided carotid stenosis, non-insulin-dependent diabetes mellitus type 2, PAD, GERD, mild COPD, history of tobacco use, hyperlipidemia, hypertension, who presented 10/05/21 to [facility] ED with facial droop, expressive aphasia, and R sided weakness and found to have left MCA CVA. He was admitted to [facility] 7/17-10/31/21. Advanced to dysphagia 3/thin liquids by cup by time of d/c from CIR. 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 Difficult to fully assess cognition given severity of linguistic impairment; however, does present with decreased safety awareness, diminished problem-solving, emergent awareness, and mild impulsivity with movement."     DIAGNOSTIC FINDINGS: MBS 10/19/21: "Pt presents with overall mild oropharyngeal dysphagia. Oral phase c/b piecemeal deglutition, decreased mastication, decreased bolus cohesion, premature spillage, and weak lingual manipulation. Pharyngeal phase c/b reduced pharyngeal peristalsis + reduced BOT approximation to PPW, which  results in mild to moderate vallecular and pyriform sinuses residuals (vallecula > pyriform sinuses for solids, pyriform sinuses > vallecula for liquids). No penetration nor aspiration appreciated with thin liquid via cup, Dysphagia 1 (puree), Dysphagia 2 (chopped), Dysphagia 3 (mechanical soft), or with 13 mm Barium tablet placed in puree. Once instance of sensed aspiration with intake of large bolus of thin liquid via straw due to swallow  initiation delay to pyriform sinuses and mistiming of swallow-breath cycle; despite strong reflexive cough aspirant was not fully cleared from trachea. Pt's level of swallow initiation was noted to vary with liquid consumption (over base of epiglottis and at pyriform sinuses) with level of swallow initiation for solids to be consistent at the vallecula. Pharyngeal stasis was partially cleared with reflexive secondary swallow." MRI brain 10/05/21: 1. 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 No associated hemorrhage or significant regional mass effect. 2. Loss of normal flow voids throughout the left ICA and MCA, consistent with slow flow and/or occlusion. 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 3. Underlying mild chronic microvascular ischemic disease. Tiny remote left ACA distribution infarct.   PAIN:  Are you having pain? Yes, shoulder, 5/10   PATIENT GOALS talk better   OBJECTIVE:    TODAY'S TREATMENT:  Reviewed information re: communication device trial; pt wishes to proceed. In preparation for arrival of pt's device, SLP targeted customization using clinic device ([employer] Touch[employer]). Pt reports personal goal of wanting to use device to assist with making phone calls. Pt ID'd icons to include in his "telephone" folder with occasional min A. Pt generated list of friends and family to include; plan to customize language for conversations specific to pt's needs in future sessions. Initiated customization of restaurants section to facilitate pt independence with ordering food for takeout/delivery and at preferred restaurants. Mod cues and multimodal supports necessary for pt to name his favorite restaurant. Subsequently pt stated his order with min cues to slow rate for 85% intelligibility. Initiated message banking of pt's name, common phrases in patient's voice.     PATIENT EDUCATION: Education details: how to  use device when making a call in an emergency Person educated: Patient Education method: Explanation, Demonstration, and written supports Education comprehension: verbalized understanding and needs further education     GOALS: Goals reviewed with patient? Yes   SHORT TERM GOALS: Target date: 10 sessions   Patient will participate in clinical assessment of swallow function with goals added as needed. Baseline: Goal status: MET   2.  Pt will communicate emergency information 100% accuracy using visual aid if necessary.  Baseline:  Goal status: MET   3.  Pt will approximate personally relevant words and phrases >80% accuracy using script training and min visual cues for apraxia.   Baseline:  Goal status: MET   4.  Pt will generate at least 4 descriptors of target word 80% of the time using semantic feature analysis to improve abilities in wordfinding and resolving communication breakdowns.  Baseline:  Goal status: MET   5.  Pt will complete HEP for dysphagia with rare min A. Baseline:  Goal status: MET   6.  Pt will generate sentences using personally relevant words list for >80% intelligibility. Baseline: 50% intelligibility Goal status: MET   7.  Pt will improve expressive language skills by taking 4-6 turns in simple conversation, with supported conversation strategies if needed. Baseline:  Goal status: MET 8.  Pt will generate sentences moderately complex sentences 8-10 words >  80% intelligibility, with self-correction/multimodal communication allowed. Baseline: 50% intelligibility Goal status: MET   9.  Patient will use intelligibility strategies and script training for 4-6 turn telephone exchange with family member or friend. Baseline:  Goal status: MET  10.  Pt will demonstrate operational competence using communication device by independently powering on/off his device, navigating forward/back and selecting icons. Baseline:  Goal status: INITIAL   11.  Pt will  answer simple questions using AAC device to provide information re: emotional state, pain, and preferences. Baseline:  Goal status: INITIAL   12.  Pt will use AAC in social exchange of 2-4 turns to share and request information.  Baseline:  Goal status: INITIAL       LONG TERM GOALS: Target date: 04/25/2022    Pt will engage in 5-8 minutes simple-mod complex conversation re: topic of interest with supported conversation, aphasia compensations.  Baseline: 01/25/22: 5 turn exchange. Goal renewed 01/25/22 Goal status: IN PROGRESS   2.  Pt will ID and attempt repair of communication breakdowns >90% of the time in session.    Baseline: 01/25/22: requires occasional mod cues to initiate correction; renewed 01/25/22 Goal status: IN PROGRESS   3.  Pt/family will demonstrate knowledge of community resources and activities to support language/communication. Baseline: 01/25/22: referral completed to TAP; pt has yet to register for group, renewed 01/25/22 Goal status: IN PROGRESS   4.  Pt will demonstrate use of swallowing precautions independently with s/sx aspiration <5% of trials. Baseline: 01/25/22: no overt s/sx when sipping water in sessions, pt and family report no issues at home with meals Goal status: DEFERRED       ASSESSMENT:   CLINICAL IMPRESSION: Patient continues with aphasia and verbal apraxia, with improvement in aphasia severity (mild) noted compared with initial evaluation (moderate). 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 Intelligibility is impacted significantly at the sentence level, although pt has improved his intelligibility in structured tasks (83% with self-correction and extended time, repetitions). Shows promise with script training to improve intelligibility for short interactions with family/community, however pt still remains limited in his ability to convey information quickly and outside of  highly structured interactions. For this reason, have discussed benefits and potential for using a communication device to supplement and augment pt's verbal communication. Goals updated to reflect focus on using a communication device over the next 3-4 weeks. I recommend skilled ST to improve pt's language and motor speech skills to improve communication and reduce frustration.    OBJECTIVE IMPAIRMENTS include expressive language, receptive language, aphasia, apraxia, dysarthria, and dysphagia. These impairments are limiting patient from managing medications, managing appointments, managing finances, household responsibilities, ADLs/IADLs, effectively communicating at home and in community, and safety when swallowing. Factors affecting potential to achieve goals and functional outcome are cooperation/participation level; pt appears highly motivated with good family support. Patient will benefit from skilled SLP services to address above impairments and improve overall function.   REHAB POTENTIAL: Excellent   PLAN: SLP FREQUENCY: 2x/week   SLP DURATION: 12 weeks   PLANNED INTERVENTIONS: Aspiration precaution training, Pharyngeal strengthening exercises, Diet toleration management , Language facilitation, Environmental controls, Trials of upgraded texture/liquids, Cognitive reorganization, Internal/external aids, Functional tasks, Multimodal communication approach, SLP instruction and feedback, Compensatory strategies, and Patient/family education  [name], MS, CCC-SLP Speech-Language Pathologist [phone]    [name], [place] 03/14/2022, 5:39 PM   [address] [place],  [place], [address] Phone: [phone]   Fax:  [phone]

## 2022-03-15 ENCOUNTER — Ambulatory Visit: Payer: Medicare Other | Admitting: Occupational Therapy

## 2022-03-15 ENCOUNTER — Ambulatory Visit: Payer: Medicare Other | Admitting: Speech Pathology

## 2022-03-15 ENCOUNTER — Ambulatory Visit: Payer: Medicare Other

## 2022-03-15 DIAGNOSIS — I63512 Cerebral infarction due to unspecified occlusion or stenosis of left middle cerebral artery: Secondary | ICD-10-CM

## 2022-03-15 DIAGNOSIS — M25611 Stiffness of right shoulder, not elsewhere classified: Secondary | ICD-10-CM

## 2022-03-15 DIAGNOSIS — M6281 Muscle weakness (generalized): Secondary | ICD-10-CM

## 2022-03-15 DIAGNOSIS — R482 Apraxia: Secondary | ICD-10-CM

## 2022-03-15 DIAGNOSIS — G8929 Other chronic pain: Secondary | ICD-10-CM

## 2022-03-15 DIAGNOSIS — R4701 Aphasia: Secondary | ICD-10-CM

## 2022-03-15 NOTE — Therapy (Signed)
OUTPATIENT SPEECH LANGUAGE PATHOLOGY TREATMENT NOTE   Patient Name: Gerald Bauer MRN: [id number] DOB:1946/02/23, 76 y.o., male Today's Date: 03/15/2022  PCP: [name], PA-C Referring Provider:  [name], PA-C  END OF SESSION:   End of Session - 03/15/22 1003     Visit Number 31    Number of Visits 45    Date for SLP Re-Evaluation 04/25/22    SLP Start Time 0900    SLP Stop Time  1000    SLP Time Calculation (min) 60 min    Activity Tolerance Patient tolerated treatment well             Past Medical History:  Diagnosis Date   Acid reflux    Arthritis    hands   Carotid atherosclerosis    Diabetes mellitus without complication ([place])    Ganglion cyst 02/23/2015   Hyperlipemia    Hypertension    Joint pain in fingers of right hand 02/23/2015   Dominant hand   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 07/11/2015   Prostate cancer ([place])    history   Stroke (cerebrum) ([place]) 10/06/2021   Tachycardia    Tinea pedis of both feet 01/10/2017   Past Surgical History:  Procedure Laterality Date   [place]  2013   PROSTATECTOMY  2012   Patient Active Problem List   Diagnosis Date Noted   H/O prostatectomy 02/06/2022   Hemiparesis, aphasia, and dysphagia as late effects of stroke ([place]) 11/07/2021   Lower urinary tract symptoms (LUTS) 11/07/2021   Myalgia due to statin 02/21/2021   Type 2 diabetes mellitus without complication, without long-term current use of insulin ([place]) 02/21/2021   Aortic atherosclerosis ([place]) 04/27/2020   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   Onychomycosis of multiple toenails with type 2 diabetes mellitus ([place]) 07/19/2016   Carotid stenosis 03/13/2016   Hyperlipidemia associated with type 2 diabetes mellitus ([place])    Hypertension associated with type 2 diabetes mellitus ([place])    Personal history of prostate cancer    Carotid atherosclerosis    GERD  (gastroesophageal reflux disease) 08/20/2013    ONSET DATE: 10/05/2021    REFERRING DIAG: Cerebral infarction due to unspecified occlusion of left middle cerebral artery   THERAPY DIAG:  Verbal apraxia Verbal apraxia   Rationale for Evaluation and Treatment Rehabilitation   SUBJECTIVE STATEMENT:             "I... quiet."             Pt accompanied by: self   PERTINENT HISTORY: Pt  is a 76 year old male with history of left-sided carotid stenosis, non-insulin-dependent diabetes mellitus type 2, PAD, GERD, mild COPD, history of tobacco use, hyperlipidemia, hypertension, who presented 10/05/21 to [facility] ED with facial droop, expressive aphasia, and R sided weakness and found to have left MCA CVA. He was admitted to [facility] 7/17-10/31/21. Advanced to dysphagia 3/thin liquids by cup by time of d/c from CIR. 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 Difficult to fully assess cognition given severity of linguistic impairment; however, does present with decreased safety awareness, diminished problem-solving, emergent awareness, and mild impulsivity with movement."     DIAGNOSTIC FINDINGS: MBS 10/19/21: "Pt presents with overall mild oropharyngeal dysphagia. Oral phase c/b piecemeal deglutition, decreased mastication, decreased bolus cohesion, premature spillage, and weak lingual manipulation. Pharyngeal phase c/b  reduced pharyngeal peristalsis + reduced BOT approximation to PPW, which results in mild to moderate vallecular and pyriform sinuses residuals (vallecula > pyriform sinuses for solids, pyriform sinuses > vallecula for liquids). No penetration nor aspiration appreciated with thin liquid via cup, Dysphagia 1 (puree), Dysphagia 2 (chopped), Dysphagia 3 (mechanical soft), or with 13 mm Barium tablet placed in puree. Once instance of sensed aspiration with intake of large bolus of thin liquid via straw due to  swallow initiation delay to pyriform sinuses and mistiming of swallow-breath cycle; despite strong reflexive cough aspirant was not fully cleared from trachea. Pt's level of swallow initiation was noted to vary with liquid consumption (over base of epiglottis and at pyriform sinuses) with level of swallow initiation for solids to be consistent at the vallecula. Pharyngeal stasis was partially cleared with reflexive secondary swallow." MRI brain 10/05/21: 1. 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 No associated hemorrhage or significant regional mass effect. 2. Loss of normal flow voids throughout the left ICA and MCA, consistent with slow flow and/or occlusion. 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 3. Underlying mild chronic microvascular ischemic disease. Tiny remote left ACA distribution infarct.   PAIN:  Are you having pain? Yes, shoulder, 5/10   PATIENT GOALS talk better   OBJECTIVE:    TODAY'S TREATMENT:  Focus of session targeted verbal communication with compensatory strategies as communication device is pending arrival. Utilized visual pacing strategy with five linear dots which patient tapped per word/syllable which greatly increased sentence production. Patient with 0% intelligible utterances on initial 3 minutes of conversational attempts, improved to 80% complete sentence intelligibility with occasional approximations (16/20 utterances) and minimal reminders to utilize targeted tapping strategy in the first half hour. Patient demonstrated fatigue factor as the second half of the session demonstrated 60% intelligible complete sentences of x20 utterance attempts. Patient will benefit from short bursts of challenging activity with rest breaks to increase verbal expression accuracy.    PATIENT EDUCATION: Education details: how to use device when making a call in an emergency Person educated:  Patient Education method: Explanation, Demonstration, and written supports Education comprehension: verbalized understanding and needs further education     GOALS: Goals reviewed with patient? Yes   SHORT TERM GOALS: Target date: 10 sessions   Patient will participate in clinical assessment of swallow function with goals added as needed. Baseline: Goal status: MET   2.  Pt will communicate emergency information 100% accuracy using visual aid if necessary.  Baseline:  Goal status: MET   3.  Pt will approximate personally relevant words and phrases >80% accuracy using script training and min visual cues for apraxia.   Baseline:  Goal status: MET   4.  Pt will generate at least 4 descriptors of target word 80% of the time using semantic feature analysis to improve abilities in wordfinding and resolving communication breakdowns.  Baseline:  Goal status: MET   5.  Pt will complete HEP for dysphagia with rare min A. Baseline:  Goal status: MET   6.  Pt will generate sentences using personally relevant words list for >80% intelligibility. Baseline: 50% intelligibility Goal status: MET   7.  Pt will improve expressive language skills by taking 4-6 turns in simple conversation, with supported conversation strategies if needed. Baseline:  Goal status: MET 8.  Pt will generate sentences moderately complex sentences 8-10 words >80% intelligibility, with self-correction/multimodal communication allowed. Baseline: 50% intelligibility Goal status: MET   9.  Patient will use intelligibility strategies and script training for 4-6 turn telephone exchange with family member or friend. Baseline:  Goal status: MET  10.  Pt will demonstrate operational competence using communication device by independently powering on/off his device, navigating forward/back and selecting icons. Baseline:  Goal status: INITIAL   11.  Pt will answer simple questions using AAC device to provide information  re: emotional state, pain, and preferences. Baseline:  Goal status: INITIAL    12.  Pt will use AAC in social exchange of 2-4 turns to share and request information.  Baseline:  Goal status: INITIAL        LONG TERM GOALS: Target date: 04/25/2022    Pt will engage in 5-8 minutes simple-mod complex conversation re: topic of interest with supported conversation, aphasia compensations.  Baseline: 01/25/22: 5 turn exchange. Goal renewed 01/25/22 Goal status: IN PROGRESS   2.  Pt will ID and attempt repair of communication breakdowns >90% of the time in session.    Baseline: 01/25/22: requires occasional mod cues to initiate correction; renewed 01/25/22 Goal status: IN PROGRESS   3.  Pt/family will demonstrate knowledge of community resources and activities to support language/communication. Baseline: 01/25/22: referral completed to TAP; pt has yet to register for group, renewed 01/25/22 Goal status: IN PROGRESS   4.  Pt will demonstrate use of swallowing precautions independently with s/sx aspiration <5% of trials. Baseline: 01/25/22: no overt s/sx when sipping water in sessions, pt and family report no issues at home with meals Goal status: DEFERRED       ASSESSMENT:   CLINICAL IMPRESSION: Patient continues with aphasia and verbal apraxia, with improvement in aphasia severity (mild) noted compared with initial evaluation (moderate). 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 Intelligibility is impacted significantly at the sentence level, although pt has improved his intelligibility in structured tasks (83% with self-correction and extended time, repetitions). Shows promise with script training to improve intelligibility for short interactions with family/community, however pt still remains limited in his ability to convey information quickly and outside of highly structured interactions. For this reason, have discussed  benefits and potential for using a communication device to supplement and augment pt's verbal communication. Goals updated to reflect focus on using a communication device over the next 3-4 weeks. I recommend skilled ST to improve pt's language and motor speech skills to improve communication and reduce frustration.    OBJECTIVE IMPAIRMENTS include expressive language, receptive language, aphasia, apraxia, dysarthria, and dysphagia. These impairments are limiting patient from managing medications, managing appointments, managing finances, household responsibilities, ADLs/IADLs, effectively communicating at home and in community, and safety when swallowing. Factors affecting potential to achieve goals and functional outcome are cooperation/participation level; pt appears highly motivated with good family support. Patient will benefit from skilled SLP services to address above impairments and improve overall function.   REHAB POTENTIAL: Excellent   PLAN: SLP FREQUENCY: 2x/week   SLP DURATION: 12 weeks   PLANNED INTERVENTIONS: Aspiration precaution training, Pharyngeal strengthening exercises, Diet toleration management , Language facilitation, Environmental controls, Trials of upgraded texture/liquids, Cognitive reorganization, Internal/external aids, Functional tasks, Multimodal communication approach, SLP instruction and feedback, Compensatory strategies, and Patient/family education  [place] L. Tedrick Port, M.A. CCC-SLP Adult-based Speech Language Pathologist [place] [phone]  [place], [place] 03/15/2022, 10:04 AM  [place] [facility] SERVICES [address] [place], [place], [address] Phone: [phone]   Fax:  [phone]

## 2022-03-15 NOTE — Therapy (Signed)
OUTPATIENT PHYSICAL THERAPY SHOULDER TREATMENT   Patient Name: Gerald Bauer MRN: [id number] DOB:1946/02/18, 76 y.o., male Today's Date: 03/15/2022  END OF SESSION:  PT End of Session - 03/15/22 1022     Visit Number 3    Number of Visits 24    Date for PT Re-Evaluation 05/29/22    PT Start Time 6256    PT Stop Time 1100    PT Time Calculation (min) 41 min    Activity Tolerance Patient tolerated treatment well    Behavior During Therapy [facility] for tasks assessed/performed             Past Medical History:  Diagnosis Date   Acid reflux    Arthritis    hands   Carotid atherosclerosis    Diabetes mellitus without complication ([place])    Ganglion cyst 02/23/2015   Hyperlipemia    Hypertension    Joint pain in fingers of right hand 02/23/2015   Dominant hand   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 07/11/2015   Prostate cancer ([place])    history   Stroke (cerebrum) ([place]) 10/06/2021   Tachycardia    Tinea pedis of both feet 01/10/2017   Past Surgical History:  Procedure Laterality Date   [place]  2013   PROSTATECTOMY  2012   Patient Active Problem List   Diagnosis Date Noted   H/O prostatectomy 02/06/2022   Hemiparesis, aphasia, and dysphagia as late effects of stroke ([place]) 11/07/2021   Lower urinary tract symptoms (LUTS) 11/07/2021   Myalgia due to statin 02/21/2021   Type 2 diabetes mellitus without complication, without long-term current use of insulin ([place]) 02/21/2021   Aortic atherosclerosis ([place]) 04/27/2020   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 Harlow Mares, MD  REFERRING DIAG: M25.511 (ICD-10-CM) - Right shoulder pain   THERAPY DIAG:  Chronic right shoulder pain  Decreased ROM of right shoulder  Muscle weakness (generalized)  Rationale for Evaluation and Treatment: Rehabilitation  ONSET DATE: 10/2021  SUBJECTIVE:  SUBJECTIVE STATEMENT:  Pt is performing well with isometric exercises at home and notes no increased pain with the exercises.  Pt has not noted any reduction in pain or improvement with ROM since starting therapy.    PERTINENT HISTORY:  Pt  is a 76 year old male with history of left-sided carotid stenosis, non-insulin-dependent diabetes mellitus type 2, PAD, GERD, mild COPD, history of tobacco use, hyperlipidemia, hypertension, who presents emergency department for chief concerns of facial droop, expressive aphasia, and R sided weakness   PAIN:  Are you having pain? Yes: NPRS scale: 5/10 Pain location: R shoulder, noting a deep pain Pain description: Sharp Aggravating factors: Moving the arm Relieving factors: Medication  PRECAUTIONS: None  WEIGHT BEARING RESTRICTIONS: No  FALLS:  Has patient fallen in last 6 months? Yes. Number of falls 2  LIVING ENVIRONMENT: Lives with: lives with their family Lives in: House/apartment Stairs: No Has following equipment at home: Single point cane  OCCUPATION: Retired from [employer]  PLOF: Independent  PATIENT GOALS: Decrease pain  NEXT MD VISIT:   OBJECTIVE:   DIAGNOSTIC FINDINGS:  No imaging recorded within Epic, but pt reports he has had an X-Ray, but no MRI.  PATIENT SURVEYS:  FOTO 28/56  COGNITION: Overall cognitive status: Within functional limits for tasks  assessed     SENSATION: WFL  POSTURE: Pt with fair posture, sitting on sacrum and forward rolled shoulders throughout the treatment time.  UPPER EXTREMITY ROM:   Active ROM Right eval Left eval  Shoulder flexion 79 125  Shoulder abduction 70 131  Shoulder adduction    Shoulder internal rotation    Shoulder external rotation    Elbow flexion    Elbow extension    (Blank rows = not tested)  UPPER EXTREMITY MMT:  MMT Right eval Left eval  Shoulder flexion 3+*  5  Shoulder extension    Shoulder abduction 4* 5  Shoulder adduction    Shoulder internal rotation    Shoulder external rotation    Middle trapezius    Lower trapezius    Elbow flexion    Elbow extension    Wrist flexion    Wrist extension    Wrist ulnar deviation    Wrist radial deviation    Wrist pronation    Wrist supination    Grip strength (lbs)    (Blank rows = not tested) * = pain  SHOULDER SPECIAL TESTS: Impingement tests: Neer impingement test: positive , Hawkins/Kennedy impingement test: positive , and Painful arc test: negative Rotator cuff assessment: Empty can test: positive , Full can test: positive , and Belly press test: positive  Biceps assessment: Yergason's test: negative  Pt reports significant pain with ER (Infraspinatus, Teres Minor),IR (Teres Major and Subscapularis), Flexion (Supraspinatus), Abduction (Supraspinatus), and Adduction (Teres Major and Minor) of the shoulder joint.  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  JOINT MOBILITY TESTING:  Pt with good mobility within the joints  PALPATION:   No significant pain noted when palpating.  Pt noted the pain to be deeper than palpation can obtain.   TODAY'S TREATMENT:  DATE: 03/15/22  TherEx:  Standing wall slides with use of washcloth at door, flexion/abduction, 2x10 each direction Standing wall circles with use of the washcloth at door, CW/CCW, 30 sec bouts Standing isometrics in all planes of mobility, 2x10 each direction Seated  AAROM (flexion/ER/scaption) with use of cane/PVC pipe for increased ROM, x10 each direction  Manual:  Shoulder mobilizations (inferior/AP/PA glides), Grades I-II for pain reduction, 30 sec x multiple bouts in  each direction Shoulder distraction, 30 sec bouts however pt noted some increased pain, so was discontinued following the second bout PROM with gentle overpressure in all planes of mobility to improve overall ROM   PATIENT EDUCATION: Education details: Pt educated throughout session about proper posture and technique with exercises. Improved exercise technique, movement at target joints, use of target muscles after min to mod verbal, visual, tactile cues Person educated: Patient Education method: Explanation, Demonstration, Tactile cues, Verbal cues, and Handouts Education comprehension: verbalized understanding, returned demonstration, and tactile cues required  HOME EXERCISE PROGRAM:  Access Code: [email or web address] URL: [email or web address] Date: 03/06/2022 Prepared by: Adrian Nation  Exercises - Standing Isometric Shoulder Flexion with Doorway - Arm Bent  - 1 x daily - 7 x weekly - 3 sets - 10 reps - Standing Isometric Shoulder Extension with Doorway - Arm Bent  - 1 x daily - 7 x weekly - 3 sets - 10 reps - Standing Isometric Shoulder Abduction with Doorway - Arm Bent  - 1 x daily - 7 x weekly - 3 sets - 10 reps - Isometric Shoulder Adduction  - 1 x daily - 7 x weekly - 3 sets - 10 reps  ASSESSMENT:  CLINICAL IMPRESSION:  Pt and therapist discussed options and d/c possibility at next session due to lack of progress and likelihood of RTC pathology contributing to the pain and lack of ROM.  Pt and DIL agreeable to updated plan and would like to be seen by the surgeon due to lack of progress.   Pt will continue to benefit from skilled therapy to address remaining deficits in order to improve overall QoL and return to PLOF.  Pt to be d/c at next visit after goal assessment.        OBJECTIVE IMPAIRMENTS: decreased ROM, decreased strength, hypomobility, and pain.   ACTIVITY LIMITATIONS: carrying, lifting, bathing, toileting, dressing, reach over head, and hygiene/grooming  PARTICIPATION LIMITATIONS: cleaning, laundry, and yard work  PERSONAL FACTORS: Age, Fitness, Past/current experiences, and 1-2 comorbidities: hx of falls, CVA, diabetes  are also affecting patient's functional outcome.   REHAB POTENTIAL: Fair pt's prognosis may be limited due to the pathology that he is experiencing within the [place]: Stable/uncomplicated  EVALUATION COMPLEXITY: Low   GOALS: Goals reviewed with patient? Yes  SHORT TERM GOALS: Target date: 04/03/2022    Pt will be independent with HEP in order to demonstrate increased ability to perform tasks related to occupation/hobbies. Baseline:  Pt given HEP during evaluation Goal status: INITIAL  LONG TERM GOALS: Target date: 05/29/2022  Patient will demonstrate improved function as evidenced by a score of 56 on FOTO measure for full participation in activities at home and in the community. Baseline:  Goal status: INITIAL  2.  Pt to demonstrate R shoulder ROM to be within 10% of the L Shoulder (10% of L Shoulder Flexion: 113 deg; Abduction: 118 deg) Baseline: R shoulder flexion: 79 deg; abduction: 70 deg Goal status: INITIAL  3.  Pt to report L shoulder pain to be </=2/10 at rest in order to demonstrate a reduction in overall pain levels that pt is experiencing in the R UE. Baseline: 5/10 pain at rest Goal status: INITIAL  PLAN:  PT FREQUENCY: 2x/week  PT DURATION: 12 weeks  PLANNED INTERVENTIONS: Therapeutic exercises, Therapeutic activity, Neuromuscular re-education, Balance training, Gait training, Patient/Family education, Self Care, Joint mobilization, Vestibular training, Canalith repositioning, DME instructions, Dry Needling, Electrical stimulation, Spinal mobilization, Cryotherapy, Moist  heat, and Manual therapy  PLAN FOR NEXT SESSION:  D/C at next visit.   Gwenlyn Saran, PT, DPT Physical Therapist- [facility]  03/15/22, 10:32 AM

## 2022-03-15 NOTE — Therapy (Signed)
Occupational Therapy Progress Note  Dates of reporting period  01/23/2022   to   03/15/2022  Patient Name: Gerald Bauer MRN: [id number] DOB:1945-04-13, 76 y.o., male Today's Date: 03/06/2022  PCP: Threasa Alpha, PA REFERRING PROVIDER: Reesa Chew    OT End of Session - 03/15/22 1101     Visit Number 30    Number of Visits 51    Date for OT Re-Evaluation 04/17/22    OT Start Time 17    OT Stop Time 1145    OT Time Calculation (min) 45 min    Activity Tolerance Patient tolerated treatment well    Behavior During Therapy [facility] for tasks assessed/performed                    Past Medical History:  Diagnosis Date   Acid reflux    Arthritis    hands   Carotid atherosclerosis    Diabetes mellitus without complication ([place])    Ganglion cyst 02/23/2015   Hyperlipemia    Hypertension    Joint pain in fingers of right hand 02/23/2015   Dominant hand   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 07/11/2015   Prostate cancer ([place])    history   Stroke (cerebrum) ([place]) 10/06/2021   Tachycardia    Tinea pedis of both feet 01/10/2017   Past Surgical History:  Procedure Laterality Date   [place]  2013   PROSTATECTOMY  2012   Patient Active Problem List   Diagnosis Date Noted   H/O prostatectomy 02/06/2022   Hemiparesis, aphasia, and dysphagia as late effects of stroke ([place]) 11/07/2021   Lower urinary tract symptoms (LUTS) 11/07/2021   Myalgia due to statin 02/21/2021   Type 2 diabetes mellitus without complication, without long-term current use of insulin ([place]) 02/21/2021   Aortic atherosclerosis ([place]) 04/27/2020   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  Rationale for Evaluation and Treatment Rehabilitation  SUBJECTIVE:    SUBJECTIVE STATEMENT:  Pt reports doing well today.   PERTINENT HISTORY: Per chart, Gerald Bauer is a 76 year old right-handed male with history of hypertension, hyperlipidemia, diabetes mellitus, prostate cancer/prostatectomy 2012, quit smoking 24 years ago.  Presented to [facility]2021 with right side weakness/facial droop and expressive aphasia.  Blood pressure 155/102.  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  No associated hemorrhage or significant mass effect.    PAIN:  Are you having pain?  5/10 R shoulder; heat, rest, gentle stretch affective for pain management, no pain at end of session FALLS: Has patient fallen in last 6 months? Yes. Number of falls 3 since CVA  PLOF: Independent/retired Dealer  PATIENT GOALS : Pt points to his hand (wants to be able to use his hand)  OBJECTIVE:   HAND DOMINANCE: Right   FUNCTIONAL OUTCOME MEASURES: FOTO: 48  UUPPER EXTREMITY ROM      Active ROM Right eval Left Eval WNL Right 12/11/21 Right 01/23/2022 Right  03/15/2022  Shoulder flexion 90 (135)   95 (110) 95 97 sitting  Shoulder abduction 95 (110)   80 (90) 95 96 sitting  Wrist flex     54 60 62  Wrist ext     41 45 48    UPPER EXTREMITY MMT:      MMT Right eval Left Eval 5/5 Right 12/11/21 Right 03/15/22  Shoulder flexion 3-   -3 3-  Shoulder abduction 3-   -3 3-  Shoulder adduction         Shoulder extension         Shoulder internal rotation 3-       Shoulder external rotation 3-       Middle trapezius         Lower trapezius         Elbow flexion 4-   4 4+  Elbow extension 4+   5 5  Wrist flexion 4   NT d/t pain 4  Wrist extension 4-    NT d/t pain 4  (Blank rows = not tested)   HAND FUNCTION: Eval:         Grip strength: Right: 14 lbs; Left: 58 lbs, Lateral pinch: Right: 7 lbs, Left: 19 lbs, and 3 point pinch: Right: 2 lbs, Left: 16 lbs 10th visit: Grip strength: Right: 16  lbs;                                Lateral pinch: Right: 9  lbs;                            3 point pinch: Right: 5 lbs 20th visit: Grip strength: Right: 20 lbs; Left: 70 lbs, Lateral pinch: Right: 10 lbs, Left: 20 lbs, and 3 point pinch: Right: 9 lbs, Left: 20 lbs  30th visit: Grip strength: Right: 18 lbs; Left: 70 lbs, Lateral pinch: Right: 12 lbs, Left: 20 lbs, and 3 point pinch: Right: 8 lbs, Left: 20 lbs      Eval: COORDINATION: Finger Nose Finger test: difficult/lacks precision with reaching targets 9 Hole Peg test: Right: unable sec; Left: 27 sec Able to oppose 2nd and 3rd digits to thumb on R hand   10th visit 9 hole Peg test: Right: 11 min, 44 sec   20th visit:  9 hole Peg test: Right: 3 min 6 sec   30th visit:  9 hole Peg test: Right: 1 min 45 sec     TODAY'S TREATMENT:   Therapeutic Exercise: Pt. performed gross gripping with a gross grip strengthener. Pt. worked on sustaining grip while grasping pegs and reaching at various heights. The Gripper was set to  11.2# of grip strength resistance.   Measurements were obtained, and goals were reviewed with the Pt. Pt. has made progress with AROM in sitting with shoulder flexion, abduction, and wrist extension. Pt. has progressed with strength in right elbow flexion, extension, and lateral pinch strength. Pt. has made significant progress with right [place], speed, and dexterity, as well as with the FOTO score to 64 over this past progress reporting period. Pt. continues to present with 5/10 right shoulder pain, and limited right shoulder AROM, decreased right grip, and 3pt. Pinch strength.  Pt. Continues to work towards improving pain, and increasing function with the right UE, and hand.         PATIENT EDUCATION: Education details: HEP progression,  handwriting skills Person educated: pt Education method: Explanation and Verbal cues, handout Education comprehension: verbalized understanding, returned demo   HOME EXERCISE PROGRAM: Theraputty, hand writing skills, table slides  GOALS: Goals reviewed with patient? Yes   SHORT TERM GOALS: Target date 04/26/2022     Pt will be indep to perform HEP for increasing strength and coordination throughout RUE.  Baseline: Not yet initiated; 10th: putty given but pt reports limited use, instructed in table slides and self PROM for R wrist/digits. 20th:  Pt performing exercises but requires continual upgrades and instruction 30th: Pt performing exercises but requires continual upgrades, modifications, and instruction as needed Goal status: ongoing   2.  Pt will manage clothing fasteners with extra time, using RUE as an assist  Baseline: unable; pt has elastic laces and wears elastic waisted pants; 10th: pt uses R hand as an assist to zip and button jeans with extra time; not yet tried small buttons on a shirt.  Pt continues to use elastic laces on shoes. 20th:  Improving with manipulation skills but still using elastic laces, able to button pants and shirt with increased time and effort. 30th: Pt. Is able to fasten pant fasteners, and button pants efficiently, ans independently. Goal status: met   LONG TERM GOALS: Target date:04/17/22   Pt will increase FOTO score to 55 or better to indicate improved performance with daily tasks.  Baseline: 48; 10th: 48; 20th: score:42 30th visit: 64 Goal status: Achieved   2.  Pt will increase R grip strength by 10 or more lbs to enable pt to hold and carry light ADL supplies without dropping.  Baseline: R grip 14#, L 58#; 10th visit: R grip 16; 20th visit: 20# 30th: 18#, Pt. Continues to have difficulty holding supplies securely. Goal status: ongoing   3.  Pt will increase RUE strength to be able to  engage RUE into ADLs at least 50% of the time. Baseline: Pt using L non-dominant arm to manage ADLs; 10th visit: Son reports he constantly sees pt attempt to use his R arm for daily tasks, but continues to be limited and still must use LUE for most tasks (see chart for RUE MMT) 20th:  continues to improve on strength and is working on consistency of use. 30th visit: Pt. Continues to use the right hand during daily ADL, and IALD tasks.  Goal status: ongoing   4.  Pt will complete 9 hole peg test on the R in 1 min or less to work towards ability to use R hand to pick up small ADL supplies.  Baseline: Pt can remove a peg but can not pick one up; 10th visit: R 9 hole completion 11 min 44 sec; 20th visit:   Pt improved to 3 mins and 6 secs, nearing goal. 30 visit: 1 min. & 45 sec. Goal status: revised from 3 min. Or less to 1 min. Or less.    ASSESSMENT:  CLINICAL IMPRESSION:  Measurements were obtained, and goals were reviewed with the Pt. Pt. has made progress with AROM in sitting with shoulder flexion, abduction, and wrist extension. Pt. has progressed with strength in right elbow flexion, extension, and lateral pinch strength. Pt. has made significant progress with right [place], speed, and dexterity, as well as with the FOTO score to 64 over this past progress reporting period. Pt. continues to present with 5/10 right shoulder pain, and limited right shoulder AROM, decreased right grip, and 3pt. Pinch strength.  Pt. Continues to work towards improving pain, and  increasing function with the right UE, and hand.      PERFORMANCE DEFICITS in functional skills including ADLs, IADLs, coordination, dexterity, ROM, strength, pain, flexibility, FMC, GMC, mobility, balance, continence, decreased knowledge of use of DME, and UE functional use, cognitive skills including safety awareness, and psychosocial skills including.   IMPAIRMENTS are limiting patient from ADLs, IADLs, leisure, and social participation.    COMORBIDITIES may have co-morbidities  that affects occupational performance. Patient will benefit from skilled OT to address above impairments and improve overall function.  MODIFICATION OR ASSISTANCE TO COMPLETE EVALUATION: Min-Moderate modification of tasks or assist with assess necessary to complete an evaluation.  OT OCCUPATIONAL PROFILE AND HISTORY: Problem focused assessment: Including review of records relating to presenting problem.  CLINICAL DECISION MAKING: Moderate - several treatment options, min-mod task modification necessary  REHAB POTENTIAL: Good  EVALUATION COMPLEXITY: Moderate    PLAN: OT FREQUENCY: 2x/week  OT DURATION: 12 weeks  PLANNED INTERVENTIONS: self care/ADL training, therapeutic exercise, therapeutic activity, neuromuscular re-education, manual therapy, passive range of motion, balance training, functional mobility training, moist heat, cryotherapy, patient/family education, cognitive remediation/compensation, energy conservation, coping strategies training, and DME and/or AE instructions  RECOMMENDED OTHER SERVICES: N/A  CONSULTED AND AGREED WITH PLAN OF CARE: Patient and family member/caregiver  PLAN FOR NEXT SESSION: HEP progression, neuro re-ed, therapeutic exercises  Harrel Carina, MS, OTR/L

## 2022-03-20 ENCOUNTER — Ambulatory Visit: Payer: Medicare Other | Admitting: Speech Pathology

## 2022-03-20 ENCOUNTER — Ambulatory Visit: Payer: Medicare Other

## 2022-03-20 ENCOUNTER — Ambulatory Visit: Payer: Medicare Other | Admitting: Occupational Therapy

## 2022-03-20 DIAGNOSIS — R482 Apraxia: Secondary | ICD-10-CM

## 2022-03-20 DIAGNOSIS — M6281 Muscle weakness (generalized): Secondary | ICD-10-CM

## 2022-03-20 DIAGNOSIS — R4701 Aphasia: Secondary | ICD-10-CM

## 2022-03-20 NOTE — Therapy (Signed)
Occupational Therapy Treatment Note MRN: [id number] DOB:[birth year], 76 y.o., male Today's Date: 03/06/2022  PCP: Threasa Alpha, PA REFERRING PROVIDER: Reesa Chew    OT End of Session - 03/20/22 1050     Visit Number 31    Number of Visits 26    Date for OT Re-Evaluation 04/17/22    Authorization Time Period 01/23/2022    OT Start Time 1000    OT Stop Time 1045    OT Time Calculation (min) 45 min    Activity Tolerance Patient tolerated treatment well    Behavior During Therapy [facility] for tasks assessed/performed                    Past Medical History:  Diagnosis Date   Acid reflux    Arthritis    hands   Carotid atherosclerosis    Diabetes mellitus without complication ([place])    Ganglion cyst 02/23/2015   Hyperlipemia    Hypertension    Joint pain in fingers of right hand 02/23/2015   Dominant hand   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 07/11/2015   Prostate cancer ([place])    history   Stroke (cerebrum) ([place]) 10/06/2021   Tachycardia    Tinea pedis of both feet 01/10/2017   Past Surgical History:  Procedure Laterality Date   [place]  2013   PROSTATECTOMY  2012   Patient Active Problem List   Diagnosis Date Noted   H/O prostatectomy 02/06/2022   Hemiparesis, aphasia, and dysphagia as late effects of stroke ([place]) 11/07/2021   Lower urinary tract symptoms (LUTS) 11/07/2021   Myalgia due to statin 02/21/2021   Type 2 diabetes mellitus without complication, without long-term current use of insulin ([place]) 02/21/2021   Aortic atherosclerosis ([place]) 04/27/2020   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   Onychomycosis of multiple toenails with type 2 diabetes mellitus ([place]) 07/19/2016   Carotid stenosis 03/13/2016   Hyperlipidemia associated with type 2 diabetes mellitus ([place])    Hypertension associated with type 2 diabetes mellitus ([place])    Personal history of prostate cancer    Carotid  atherosclerosis    GERD (gastroesophageal reflux disease) 08/20/2013    ONSET DATE: 10/05/21  REFERRING DIAG: L MCA CVA  THERAPY DIAG:  Muscle weakness (generalized)  Rationale for Evaluation and Treatment Rehabilitation  SUBJECTIVE:    SUBJECTIVE STATEMENT:  Pt reports doing well today.   PERTINENT HISTORY: Per chart, Zayne Draheim is a 76 year old right-handed male with history of hypertension, hyperlipidemia, diabetes mellitus, prostate cancer/prostatectomy 2012, quit smoking 24 years ago.  Presented to [facility]2021 with right side weakness/facial droop and expressive aphasia.  Blood pressure 155/102.  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  No associated hemorrhage or significant mass effect.    PAIN:  Are you having pain?  5/10 R shoulder; heat, rest, gentle stretch affective for pain management, no pain at end of session FALLS: Has patient fallen in last 6 months? Yes. Number of falls 3 since CVA  PLOF: Independent/retired Dealer  PATIENT GOALS : Pt points to his hand (wants to be able to use his hand)  OBJECTIVE:   HAND DOMINANCE: Right   FUNCTIONAL OUTCOME MEASURES: FOTO: 48  UUPPER EXTREMITY ROM      Active ROM Right eval Left Eval WNL Right 12/11/21 Right 01/23/2022 Right 03/15/2022  Shoulder flexion 90 (135)   95 (110) 95 97  sitting  Shoulder abduction 95 (110)   80 (90) 95 96 sitting  Wrist flex     54 60 62  Wrist ext     41 45 48    UPPER EXTREMITY MMT:      MMT Right eval Left Eval 5/5 Right 12/11/21 Right 03/15/22  Shoulder flexion 3-   -3 3-  Shoulder abduction 3-   -3 3-  Shoulder adduction         Shoulder extension         Shoulder internal rotation 3-       Shoulder external rotation 3-       Middle trapezius         Lower trapezius         Elbow flexion 4-   4 4+  Elbow extension 4+   5 5  Wrist flexion 4   NT d/t pain 4  Wrist extension 4-   NT d/t pain 4  (Blank rows = not  tested)   HAND FUNCTION: Eval:         Grip strength: Right: 14 lbs; Left: 58 lbs, Lateral pinch: Right: 7 lbs, Left: 19 lbs, and 3 point pinch: Right: 2 lbs, Left: 16 lbs 10th visit: Grip strength: Right: 16  lbs;                                Lateral pinch: Right: 9  lbs;                            3 point pinch: Right: 5 lbs 20th visit: Grip strength: Right: 20 lbs; Left: 70 lbs, Lateral pinch: Right: 10 lbs, Left: 20 lbs, and 3 point pinch: Right: 9 lbs, Left: 20 lbs  30th visit: Grip strength: Right: 18 lbs; Left: 70 lbs, Lateral pinch: Right: 12 lbs, Left: 20 lbs, and 3 point pinch: Right: 8 lbs, Left: 20 lbs      Eval: COORDINATION: Finger Nose Finger test: difficult/lacks precision with reaching targets 9 Hole Peg test: Right: unable sec; Left: 27 sec Able to oppose 2nd and 3rd digits to thumb on R hand   10th visit 9 hole Peg test: Right: 11 min, 44 sec   20th visit:  9 hole Peg test: Right: 3 min 6 sec   30th visit:  9 hole Peg test: Right: 1 min 45 sec     TODAY'S TREATMENT:   Therapeutic Exercise:  Pt. tolerated right shoulder stretches for shoulder flexion, abduction in supine. Pt. performed shoulder protraction with the shoulder at 90 degrees of flexion with the elbow extended. Pt. was able to stabilize the right shoulder with perturbations, Pt. Performed bilateral shoulder flexion with 1.5 weight in supine  Pt. performed gross gripping with a gross grip strengthener. Pt. worked on sustaining grip while grasping pegs and reaching at various heights. The gripper was set to  11.2# of grip strength resistance. Pt. Worked on pinch strengthening in the right hand for lateral, and 3pt. pinch using yellow, red, green, and blue resistive clips. Pt. worked on placing the clips at various vertical and horizontal angles. Tactile and verbal cues were required for eliciting the desired movement. Pt. worked on right hand digit flexion using the 1.5# DigiFlex.   Manual Therapy:  Pt.  Tolerated joint mobilization in the right shoulder for flexion, and abduction. Manual Therapy was performed independent of, and in  preparation for ROM, and therapeutic Ex.       PATIENT EDUCATION: Education details: HEP progression, Engineer, production Person educated: pt Education method: Explanation and Verbal cues, handout Education comprehension: verbalized understanding, returned demo   HOME EXERCISE PROGRAM: Theraputty, hand writing skills, table slides  GOALS: Goals reviewed with patient? Yes   SHORT TERM GOALS: Target date 04/26/2022     Pt will be indep to perform HEP for increasing strength and coordination throughout RUE.  Baseline: Not yet initiated; 10th: putty given but pt reports limited use, instructed in table slides and self PROM for R wrist/digits. 20th:  Pt performing exercises but requires continual upgrades and instruction 30th: Pt performing exercises but requires continual upgrades, modifications, and instruction as needed Goal status: ongoing   2.  Pt will manage clothing fasteners with extra time, using RUE as an assist  Baseline: unable; pt has elastic laces and wears elastic waisted pants; 10th: pt uses R hand as an assist to zip and button jeans with extra time; not yet tried small buttons on a shirt.  Pt continues to use elastic laces on shoes. 20th:  Improving with manipulation skills but still using elastic laces, able to button pants and shirt with increased time and effort. 30th: Pt. Is able to fasten pant fasteners, and button pants efficiently, ans independently. Goal status: met   LONG TERM GOALS: Target date:04/17/22   Pt will increase FOTO score to 55 or better to indicate improved performance with daily tasks.  Baseline: 48; 10th: 48; 20th: score:42 30th visit: 64 Goal status: Achieved   2.  Pt will increase R grip strength by 10 or more lbs to enable pt to hold and carry light ADL supplies without dropping.  Baseline: R grip 14#, L 58#; 10th  visit: R grip 16; 20th visit: 20# 30th: 18#, Pt. Continues to have difficulty holding supplies securely. Goal status: ongoing   3.  Pt will increase RUE strength to be able to engage RUE into ADLs at least 50% of the time. Baseline: Pt using L non-dominant arm to manage ADLs; 10th visit: Son reports he constantly sees pt attempt to use his R arm for daily tasks, but continues to be limited and still must use LUE for most tasks (see chart for RUE MMT) 20th:  continues to improve on strength and is working on consistency of use. 30th visit: Pt. Continues to use the right hand during daily ADL, and IALD tasks.  Goal status: ongoing   4.  Pt will complete 9 hole peg test on the R in 1 min or less to work towards ability to use R hand to pick up small ADL supplies.  Baseline: Pt can remove a peg but can not pick one up; 10th visit: R 9 hole completion 11 min 44 sec; 20th visit:   Pt improved to 3 mins and 6 secs, nearing goal. 30 visit: 1 min. & 45 sec. Goal status: revised from 3 min. Or less to 1 min. Or less.    ASSESSMENT:  CLINICAL IMPRESSION:  Pt. tolerated the manual therapy, exercises, and stretches well. Pt.'s right shoulder pain improved from 5/10 initially to 0/10 after treatment. Pt. continues to present with limited RUE strength, grip strength, hand function and [facility] skills. Pt. continues to work on these skills to improve overall RUE functioning in order to improve, and maximize independence with ADLs, and IADL tasks.       PERFORMANCE DEFICITS in functional skills including ADLs, IADLs, coordination, dexterity, ROM,  strength, pain, flexibility, FMC, GMC, mobility, balance, continence, decreased knowledge of use of DME, and UE functional use, cognitive skills including safety awareness, and psychosocial skills including.   IMPAIRMENTS are limiting patient from ADLs, IADLs, leisure, and social participation.   COMORBIDITIES may have co-morbidities  that affects occupational  performance. Patient will benefit from skilled OT to address above impairments and improve overall function.  MODIFICATION OR ASSISTANCE TO COMPLETE EVALUATION: Min-Moderate modification of tasks or assist with assess necessary to complete an evaluation.  OT OCCUPATIONAL PROFILE AND HISTORY: Problem focused assessment: Including review of records relating to presenting problem.  CLINICAL DECISION MAKING: Moderate - several treatment options, min-mod task modification necessary  REHAB POTENTIAL: Good  EVALUATION COMPLEXITY: Moderate    PLAN: OT FREQUENCY: 2x/week  OT DURATION: 12 weeks  PLANNED INTERVENTIONS: self care/ADL training, therapeutic exercise, therapeutic activity, neuromuscular re-education, manual therapy, passive range of motion, balance training, functional mobility training, moist heat, cryotherapy, patient/family education, cognitive remediation/compensation, energy conservation, coping strategies training, and DME and/or AE instructions  RECOMMENDED OTHER SERVICES: N/A  CONSULTED AND AGREED WITH PLAN OF CARE: Patient and family member/caregiver  PLAN FOR NEXT SESSION: HEP progression, neuro re-ed, therapeutic exercises  Harrel Carina, MS, OTR/L

## 2022-03-21 NOTE — Therapy (Signed)
OUTPATIENT SPEECH LANGUAGE PATHOLOGY TREATMENT NOTE   Patient Name: Gerald Bauer MRN: [id number] DOB:[birth year], 76 y.o., male Today's Date: 03/21/2022  PCP: [name], PA-C Referring Provider:  [name], PA-C  END OF SESSION:   End of Session - 03/21/22 0937     Visit Number 32    Number of Visits 45    Date for SLP Re-Evaluation 04/25/22    SLP Start Time 0905    SLP Stop Time  1000    SLP Time Calculation (min) 55 min    Activity Tolerance Patient tolerated treatment well             Past Medical History:  Diagnosis Date   Acid reflux    Arthritis    hands   Carotid atherosclerosis    Diabetes mellitus without complication ([place])    Ganglion cyst 02/23/2015   Hyperlipemia    Hypertension    Joint pain in fingers of right hand 02/23/2015   Dominant hand   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 07/11/2015   Prostate cancer ([place])    history   Stroke (cerebrum) ([place]) 10/06/2021   Tachycardia    Tinea pedis of both feet 01/10/2017   Past Surgical History:  Procedure Laterality Date   [place]  2013   PROSTATECTOMY  2012   Patient Active Problem List   Diagnosis Date Noted   H/O prostatectomy 02/06/2022   Hemiparesis, aphasia, and dysphagia as late effects of stroke ([place]) 11/07/2021   Lower urinary tract symptoms (LUTS) 11/07/2021   Myalgia due to statin 02/21/2021   Type 2 diabetes mellitus without complication, without long-term current use of insulin ([place]) 02/21/2021   Aortic atherosclerosis ([place]) 04/27/2020   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   Onychomycosis of multiple toenails with type 2 diabetes mellitus ([place]) 07/19/2016   Carotid stenosis 03/13/2016   Hyperlipidemia associated with type 2 diabetes mellitus ([place])    Hypertension associated with type 2 diabetes mellitus ([place])    Personal history of prostate cancer    Carotid atherosclerosis    GERD  (gastroesophageal reflux disease) 08/20/2013    ONSET DATE: 10/05/2021    REFERRING DIAG: Cerebral infarction due to unspecified occlusion of left middle cerebral artery   THERAPY DIAG:  Verbal apraxia Verbal apraxia   Rationale for Evaluation and Treatment Rehabilitation   SUBJECTIVE STATEMENT:             Pt reports his grandson (great) couldn't understand him         Pt accompanied by: self   PERTINENT HISTORY: Pt  is a 76 year old male with history of left-sided carotid stenosis, non-insulin-dependent diabetes mellitus type 2, PAD, GERD, mild COPD, history of tobacco use, hyperlipidemia, hypertension, who presented 10/05/21 to [facility] ED with facial droop, expressive aphasia, and R sided weakness and found to have left MCA CVA. He was admitted to [facility] 7/17-10/31/21. Advanced to dysphagia 3/thin liquids by cup by time of d/c from CIR. 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 Difficult to fully assess cognition given severity of linguistic impairment; however, does present with decreased safety awareness, diminished problem-solving, emergent awareness, and mild impulsivity with movement."     DIAGNOSTIC FINDINGS: MBS 10/19/21: "Pt presents with overall mild oropharyngeal dysphagia. Oral phase c/b piecemeal deglutition, decreased mastication, decreased bolus cohesion, premature spillage, and weak lingual manipulation. Pharyngeal  phase c/b reduced pharyngeal peristalsis + reduced BOT approximation to PPW, which results in mild to moderate vallecular and pyriform sinuses residuals (vallecula > pyriform sinuses for solids, pyriform sinuses > vallecula for liquids). No penetration nor aspiration appreciated with thin liquid via cup, Dysphagia 1 (puree), Dysphagia 2 (chopped), Dysphagia 3 (mechanical soft), or with 13 mm Barium tablet placed in puree. Once instance of sensed aspiration with intake of large  bolus of thin liquid via straw due to swallow initiation delay to pyriform sinuses and mistiming of swallow-breath cycle; despite strong reflexive cough aspirant was not fully cleared from trachea. Pt's level of swallow initiation was noted to vary with liquid consumption (over base of epiglottis and at pyriform sinuses) with level of swallow initiation for solids to be consistent at the vallecula. Pharyngeal stasis was partially cleared with reflexive secondary swallow." MRI brain 10/05/21: 1. 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 No associated hemorrhage or significant regional mass effect. 2. Loss of normal flow voids throughout the left ICA and MCA, consistent with slow flow and/or occlusion. 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 3. Underlying mild chronic microvascular ischemic disease. Tiny remote left ACA distribution infarct.   PAIN:  Are you having pain? Yes, shoulder, 5/10   PATIENT GOALS talk better   OBJECTIVE:    TODAY'S TREATMENT: Pt concerned about device trial due to potential cost of communication device. Encouraged pt to fill in financial aid form, and educated that trialing device is not obligation to purchase. Further educated that goal is to determine which aid is most reasonable for pt, and to consider many options. Will send trial device home once application is complete; daughter-in-law to assist this week with this. Targeted simple conversation re: holidays, with initial mod-max cues for slow rate, tapping, and improvement in message comprehension from 0% to 80% using strategies. Worked with pt and daughter-in-law to identify additional opportunities for customizing language for communication device for restaurant orders and phone calls.    PATIENT EDUCATION: Education details: Pt/SLP will select most appropriate communication aid together after reviewing many different options. Person  educated: Patient Education method: Explanation, Demonstration, and written supports Education comprehension: verbalized understanding and needs further education     GOALS: Goals reviewed with patient? Yes   SHORT TERM GOALS: Target date: 10 sessions   Patient will participate in clinical assessment of swallow function with goals added as needed. Baseline: Goal status: MET   2.  Pt will communicate emergency information 100% accuracy using visual aid if necessary.  Baseline:  Goal status: MET   3.  Pt will approximate personally relevant words and phrases >80% accuracy using script training and min visual cues for apraxia.   Baseline:  Goal status: MET   4.  Pt will generate at least 4 descriptors of target word 80% of the time using semantic feature analysis to improve abilities in wordfinding and resolving communication breakdowns.  Baseline:  Goal status: MET   5.  Pt will complete HEP for dysphagia with rare min A. Baseline:  Goal status: MET   6.  Pt will generate sentences using personally relevant words list for >80% intelligibility. Baseline: 50% intelligibility Goal status: MET   7.  Pt will improve expressive language skills by taking 4-6 turns in simple conversation, with supported conversation strategies if needed. Baseline:  Goal status: MET 8.  Pt will generate sentences moderately complex sentences 8-10 words >80% intelligibility, with self-correction/multimodal communication allowed. Baseline: 50% intelligibility  Goal status: MET   9.  Patient will use intelligibility strategies and script training for 4-6 turn telephone exchange with family member or friend. Baseline:  Goal status: MET  10.  Pt will demonstrate operational competence using communication device by independently powering on/off his device, navigating forward/back and selecting icons. Baseline:  Goal status: INITIAL   11.  Pt will answer simple questions using AAC device to provide  information re: emotional state, pain, and preferences. Baseline:  Goal status: INITIAL    12.  Pt will use AAC in social exchange of 2-4 turns to share and request information.  Baseline:  Goal status: INITIAL        LONG TERM GOALS: Target date: 04/25/2022    Pt will engage in 5-8 minutes simple-mod complex conversation re: topic of interest with supported conversation, aphasia compensations.  Baseline: 01/25/22: 5 turn exchange. Goal renewed 01/25/22 Goal status: IN PROGRESS   2.  Pt will ID and attempt repair of communication breakdowns >90% of the time in session.    Baseline: 01/25/22: requires occasional mod cues to initiate correction; renewed 01/25/22 Goal status: IN PROGRESS   3.  Pt/family will demonstrate knowledge of community resources and activities to support language/communication. Baseline: 01/25/22: referral completed to [facility]; pt has yet to register for group, renewed 01/25/22 Goal status: IN PROGRESS   4.  Pt will demonstrate use of swallowing precautions independently with s/sx aspiration <5% of trials. Baseline: 01/25/22: no overt s/sx when sipping water in sessions, pt and family report no issues at home with meals Goal status: DEFERRED       ASSESSMENT:   CLINICAL IMPRESSION: Patient continues with aphasia and verbal apraxia, with improvement in aphasia severity (mild) noted compared with initial evaluation (moderate). 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 Intelligibility is impacted significantly at the sentence level, although pt has improved his intelligibility in structured tasks (83% with self-correction and extended time, repetitions). Shows promise with script training to improve intelligibility for short interactions with family/community, however pt still remains limited in his ability to convey information quickly and outside of highly structured interactions. For this reason,  have discussed benefits and potential for using a communication device to supplement and augment pt's verbal communication. Goals updated to reflect focus on using a communication device over the next 3-4 weeks. I recommend skilled ST to improve pt's language and motor speech skills to improve communication and reduce frustration.    OBJECTIVE IMPAIRMENTS include expressive language, receptive language, aphasia, apraxia, dysarthria, and dysphagia. These impairments are limiting patient from managing medications, managing appointments, managing finances, household responsibilities, ADLs/IADLs, effectively communicating at home and in community, and safety when swallowing. Factors affecting potential to achieve goals and functional outcome are cooperation/participation level; pt appears highly motivated with good family support. Patient will benefit from skilled SLP services to address above impairments and improve overall function.   REHAB POTENTIAL: Excellent   PLAN: SLP FREQUENCY: 2x/week   SLP DURATION: 12 weeks   PLANNED INTERVENTIONS: Aspiration precaution training, Pharyngeal strengthening exercises, Diet toleration management , Language facilitation, Environmental controls, Trials of upgraded texture/liquids, Cognitive reorganization, Internal/external aids, Functional tasks, Multimodal communication approach, SLP instruction and feedback, Compensatory strategies, and Patient/family education  [name], MS, CCC-SLP Speech-Language Pathologist [phone]   [name], [place] 03/21/2022, 9:38 AM  [place] [address] [place], [place], [address] Phone: [phone]   Fax:  [phone]

## 2022-03-22 ENCOUNTER — Ambulatory Visit: Payer: Medicare Other | Admitting: Speech Pathology

## 2022-03-22 ENCOUNTER — Ambulatory Visit: Payer: Medicare Other | Admitting: Occupational Therapy

## 2022-03-22 ENCOUNTER — Ambulatory Visit: Payer: Medicare Other

## 2022-03-23 ENCOUNTER — Encounter: Payer: Self-pay | Admitting: Dermatology

## 2022-03-27 ENCOUNTER — Ambulatory Visit: Payer: Medicare Other | Admitting: Occupational Therapy

## 2022-03-27 ENCOUNTER — Ambulatory Visit: Payer: Medicare Other

## 2022-03-27 ENCOUNTER — Ambulatory Visit: Payer: Medicare Other | Attending: Physical Medicine and Rehabilitation | Admitting: Speech Pathology

## 2022-03-27 DIAGNOSIS — M6281 Muscle weakness (generalized): Secondary | ICD-10-CM | POA: Insufficient documentation

## 2022-03-27 DIAGNOSIS — R4701 Aphasia: Secondary | ICD-10-CM | POA: Diagnosis present

## 2022-03-27 DIAGNOSIS — R482 Apraxia: Secondary | ICD-10-CM | POA: Diagnosis present

## 2022-03-27 DIAGNOSIS — R278 Other lack of coordination: Secondary | ICD-10-CM | POA: Insufficient documentation

## 2022-03-27 DIAGNOSIS — I63512 Cerebral infarction due to unspecified occlusion or stenosis of left middle cerebral artery: Secondary | ICD-10-CM | POA: Insufficient documentation

## 2022-03-27 NOTE — Therapy (Addendum)
Occupational Therapy Treatment Note MRN: [id number] DOB:[birth year], 77 y.o., male Today's Date: 03/06/2022  PCP: Threasa Alpha, PA REFERRING PROVIDER: Reesa Chew    OT End of Session - 03/27/22 1057     Visit Number 32    Number of Visits 76    Date for OT Re-Evaluation 04/17/22    Authorization Time Period 01/23/2022    OT Start Time 1000    OT Stop Time 1045    OT Time Calculation (min) 45 min    Activity Tolerance Patient tolerated treatment well    Behavior During Therapy [facility] for tasks assessed/performed                    Past Medical History:  Diagnosis Date   Acid reflux    Arthritis    hands   Carotid atherosclerosis    Diabetes mellitus without complication ([place])    Ganglion cyst 02/23/2015   Hyperlipemia    Hypertension    Joint pain in fingers of right hand 02/23/2015   Dominant hand   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) 11/07/2021   Lower urinary tract symptoms (LUTS) 11/07/2021   Myalgia due to statin 02/21/2021   Type 2 diabetes mellitus without complication, without long-term current use of insulin ([place]) 02/21/2021   Aortic atherosclerosis ([place]) 04/27/2020   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   Onychomycosis of multiple toenails with type 2 diabetes mellitus ([place]) 07/19/2016   Carotid stenosis 03/13/2016   Hyperlipidemia associated with type 2 diabetes mellitus ([place])    Hypertension associated with type 2 diabetes mellitus ([place])    Personal history of prostate cancer    Carotid  atherosclerosis    GERD (gastroesophageal reflux disease) 08/20/2013    ONSET DATE: 10/05/21  REFERRING DIAG: L MCA CVA  THERAPY DIAG:  Muscle weakness (generalized)  Rationale for Evaluation and Treatment Rehabilitation  SUBJECTIVE:   SUBJECTIVE STATEMENT:  Pt. reports doing well today.   PERTINENT HISTORY: Per chart, Teancum Brule is a 77 year old right-handed male with history of hypertension, hyperlipidemia, diabetes mellitus, prostate cancer/prostatectomy 2012, quit smoking 24 years ago.  Presented to [facility] 10/05/2021 with right side weakness/facial droop and expressive aphasia.  Blood pressure 155/102.  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  No associated hemorrhage or significant mass effect.    PAIN:  Are you having pain?  5/10 R shoulder; heat, rest, gentle stretch affective for pain management, no pain at end of session FALLS: Has patient fallen in last 6 months? Yes. Number of falls 3 since CVA  PLOF: Independent/retired Dealer  PATIENT GOALS : Pt points to his hand (wants to be able to use his hand)  OBJECTIVE:   HAND DOMINANCE: Right   FUNCTIONAL OUTCOME MEASURES: FOTO: 48  UUPPER EXTREMITY ROM      Active ROM Right eval Left Eval WNL Right 12/11/21 Right 01/23/2022 Right 03/15/2022  Shoulder flexion 90 (135)   95 (110) 95 97 sitting  Shoulder abduction 95 (110)   80 (90) 95 96 sitting  Wrist flex     54 60 62  Wrist ext     41 45 48    UPPER EXTREMITY MMT:      MMT Right eval Left Eval 5/5 Right 12/11/21 Right 03/15/22  Shoulder flexion 3-   -3 3-  Shoulder abduction 3-   -3 3-  Shoulder adduction         Shoulder extension         Shoulder internal rotation 3-       Shoulder external rotation 3-       Middle trapezius         Lower trapezius         Elbow flexion 4-   4 4+  Elbow extension 4+   5 5  Wrist flexion 4   NT d/t pain 4  Wrist extension 4-   NT d/t pain 4  (Blank rows = not  tested)   HAND FUNCTION: Eval:         Grip strength: Right: 14 lbs; Left: 58 lbs, Lateral pinch: Right: 7 lbs, Left: 19 lbs, and 3 point pinch: Right: 2 lbs, Left: 16 lbs 10th visit: Grip strength: Right: 16  lbs;                                Lateral pinch: Right: 9  lbs;                            3 point pinch: Right: 5 lbs 20th visit: Grip strength: Right: 20 lbs; Left: 70 lbs, Lateral pinch: Right: 10 lbs, Left: 20 lbs, and 3 point pinch: Right: 9 lbs, Left: 20 lbs  30th visit: Grip strength: Right: 18 lbs; Left: 70 lbs, Lateral pinch: Right: 12 lbs, Left: 20 lbs, and 3 point pinch: Right: 8 lbs, Left: 20 lbs      Eval: COORDINATION: Finger Nose Finger test: difficult/lacks precision with reaching targets 9 Hole Peg test: Right: unable sec; Left: 27 sec Able to oppose 2nd and 3rd digits to thumb on R hand   10th visit 9 hole Peg test: Right: 11 min, 44 sec   20th visit:  9 hole Peg test: Right: 3 min 6 sec   30th visit:  9 hole Peg test: Right: 1 min 45 sec     TODAY'S TREATMENT:   Therapeutic Exercise:  Pt. tolerated right shoulder stretches for shoulder flexion, abduction in supine. Pt. performed shoulder protraction with the shoulder at 90 degrees of flexion with the elbow extended and circular motion. Pt. performed bilateral shoulder flexion, and chest presses with a 2# weight. Pt. worked on pinch strengthening in the right hand for lateral, and 3pt. pinch using yellow, red, and green resistive clips. Pt. worked on grasping one inch resistive cubes alternating thumb opposition to the tip of isolated 2nd digit extension. Pt. worked on Clinical cytogeneticist moving the clips from one clip position to another. The board was positioned at a vertical angle. Pt. worked on pressing them back into place while isolating the 2nd digit. Pt. worked on placing the clips on a horizontal dowel. Tactile and verbal cues were required for eliciting the desired movement.   Manual Therapy:  Pt.  Tolerated joint mobilization in the right shoulder for flexion. Manual Therapy was performed independent of, and in preparation for ROM,  and therapeutic Ex.       PATIENT EDUCATION: Education details: HEP progression, Engineer, production Person educated: pt Education method: Explanation and Verbal cues, handout Education comprehension: verbalized understanding, returned demo   HOME EXERCISE PROGRAM: Theraputty, hand writing skills, table slides  GOALS: Goals reviewed with patient? Yes   SHORT TERM GOALS: Target date 04/26/2022     Pt will be indep to perform HEP for increasing strength and coordination throughout RUE.  Baseline: Not yet initiated; 10th: putty given but pt reports limited use, instructed in table slides and self PROM for R wrist/digits. 20th:  Pt performing exercises but requires continual upgrades and instruction 30th: Pt performing exercises but requires continual upgrades, modifications, and instruction as needed Goal status: ongoing   2.  Pt will manage clothing fasteners with extra time, using RUE as an assist  Baseline: unable; pt has elastic laces and wears elastic waisted pants; 10th: pt uses R hand as an assist to zip and button jeans with extra time; not yet tried small buttons on a shirt.  Pt continues to use elastic laces on shoes. 20th:  Improving with manipulation skills but still using elastic laces, able to button pants and shirt with increased time and effort. 30th: Pt. Is able to fasten pant fasteners, and button pants efficiently, ans independently. Goal status: met   LONG TERM GOALS: Target date:04/17/22   Pt will increase FOTO score to 55 or better to indicate improved performance with daily tasks.  Baseline: 48; 10th: 48; 20th: score:42 30th visit: 64 Goal status: Achieved   2.  Pt will increase R grip strength by 10 or more lbs to enable pt to hold and carry light ADL supplies without dropping.  Baseline: R grip 14#, L 58#; 10th visit: R grip 16;  20th visit: 20# 30th: 18#, Pt. Continues to have difficulty holding supplies securely. Goal status: ongoing   3.  Pt will increase RUE strength to be able to engage RUE into ADLs at least 50% of the time. Baseline: Pt using L non-dominant arm to manage ADLs; 10th visit: Son reports he constantly sees pt attempt to use his R arm for daily tasks, but continues to be limited and still must use LUE for most tasks (see chart for RUE MMT) 20th:  continues to improve on strength and is working on consistency of use. 30th visit: Pt. Continues to use the right hand during daily ADL, and IALD tasks.  Goal status: ongoing   4.  Pt will complete 9 hole peg test on the R in 1 min or less to work towards ability to use R hand to pick up small ADL supplies.  Baseline: Pt can remove a peg but can not pick one up; 10th visit: R 9 hole completion 11 min 44 sec; 20th visit:   Pt improved to 3 mins and 6 secs, nearing goal. 30 visit: 1 min. & 45 sec. Goal status: revised from 3 min. Or less to 1 min. Or less.    ASSESSMENT:  CLINICAL IMPRESSION:  Pt. has improved with no reports of right shoulder pain today. Pt. tolerated the manual therapy, exercises, and stretches well. Pt. required verbal cues, and cues for visual demonstration of proper technique for translatory movements when moving the clips through his hand, as well as preparing to press the cubes into place. Pt. continues to present with limited RUE strength, grip strength, hand function and [facility] skills. Pt. continues to work on these skills to improve overall RUE functioning in  order to improve, and maximize independence with ADLs, and IADL tasks.    PERFORMANCE DEFICITS in functional skills including ADLs, IADLs, coordination, dexterity, ROM, strength, pain, flexibility, FMC, GMC, mobility, balance, continence, decreased knowledge of use of DME, and UE functional use, cognitive skills including safety awareness, and psychosocial skills including.    IMPAIRMENTS are limiting patient from ADLs, IADLs, leisure, and social participation.   COMORBIDITIES may have co-morbidities  that affects occupational performance. Patient will benefit from skilled OT to address above impairments and improve overall function.  MODIFICATION OR ASSISTANCE TO COMPLETE EVALUATION: Min-Moderate modification of tasks or assist with assess necessary to complete an evaluation.  OT OCCUPATIONAL PROFILE AND HISTORY: Problem focused assessment: Including review of records relating to presenting problem.  CLINICAL DECISION MAKING: Moderate - several treatment options, min-mod task modification necessary  REHAB POTENTIAL: Good  EVALUATION COMPLEXITY: Moderate    PLAN: OT FREQUENCY: 2x/week  OT DURATION: 12 weeks  PLANNED INTERVENTIONS: self care/ADL training, therapeutic exercise, therapeutic activity, neuromuscular re-education, manual therapy, passive range of motion, balance training, functional mobility training, moist heat, cryotherapy, patient/family education, cognitive remediation/compensation, energy conservation, coping strategies training, and DME and/or AE instructions  RECOMMENDED OTHER SERVICES: N/A  CONSULTED AND AGREED WITH PLAN OF CARE: Patient and family member/caregiver  PLAN FOR NEXT SESSION: HEP progression, neuro re-ed, therapeutic exercises  Harrel Carina, MS, OTR/L   Harrel Carina, MS, OTR/L

## 2022-03-27 NOTE — Therapy (Signed)
OUTPATIENT SPEECH LANGUAGE PATHOLOGY TREATMENT NOTE   Patient Name: Gerald Bauer MRN: [id number] DOB:Dec 22, 1945, 77 y.o., male Today's Date: 03/27/2022  PCP: [name], PA-C Referring Provider:  [name], PA-C  END OF SESSION:   End of Session - 03/27/22 0908     Visit Number 33    Number of Visits 45    Date for SLP Re-Evaluation 04/25/22    SLP Start Time 0905    SLP Stop Time  1000    SLP Time Calculation (min) 55 min    Activity Tolerance Patient tolerated treatment well             Past Medical History:  Diagnosis Date   Acid reflux    Arthritis    hands   Carotid atherosclerosis    Diabetes mellitus without complication ([place])    Ganglion cyst 02/23/2015   Hyperlipemia    Hypertension    Joint pain in fingers of right hand 02/23/2015   Dominant hand   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 07/11/2015   Prostate cancer ([place])    history   Stroke (cerebrum) ([place]) 10/06/2021   Tachycardia    Tinea pedis of both feet 01/10/2017   Past Surgical History:  Procedure Laterality Date   [place]  2013   PROSTATECTOMY  2012   Patient Active Problem List   Diagnosis Date Noted   H/O prostatectomy 02/06/2022   Hemiparesis, aphasia, and dysphagia as late effects of stroke ([place]) 11/07/2021   Lower urinary tract symptoms (LUTS) 11/07/2021   Myalgia due to statin 02/21/2021   Type 2 diabetes mellitus without complication, without long-term current use of insulin ([place]) 02/21/2021   Aortic atherosclerosis ([place]) 04/27/2020   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   Onychomycosis of multiple toenails with type 2 diabetes mellitus ([place]) 07/19/2016   Carotid stenosis 03/13/2016   Hyperlipidemia associated with type 2 diabetes mellitus ([place])    Hypertension associated with type 2 diabetes mellitus ([place])    Personal history of prostate cancer    Carotid atherosclerosis    GERD  (gastroesophageal reflux disease) 08/20/2013    ONSET DATE: 10/05/2021    REFERRING DIAG: Cerebral infarction due to unspecified occlusion of left middle cerebral artery   THERAPY DIAG:  Verbal apraxia Verbal apraxia   Rationale for Evaluation and Treatment Rehabilitation   SUBJECTIVE STATEMENT:             "P(unintelligble) cheese"; pt reports sick last week after eating expired pimento cheese      Pt accompanied by: self   PERTINENT HISTORY: Pt  is a 77 year old male with history of left-sided carotid stenosis, non-insulin-dependent diabetes mellitus type 2, PAD, GERD, mild COPD, history of tobacco use, hyperlipidemia, hypertension, who presented 10/05/21 to [facility] ED with facial droop, expressive aphasia, and R sided weakness and found to have left MCA CVA. He was admitted to [facility] 7/17-10/31/21. Advanced to dysphagia 3/thin liquids by cup by time of d/c from CIR. 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 Difficult to fully assess cognition given severity of linguistic impairment; however, does present with decreased safety awareness, diminished problem-solving, emergent awareness, and mild impulsivity with movement."     DIAGNOSTIC FINDINGS: MBS 10/19/21: "Pt presents with overall mild oropharyngeal dysphagia. Oral phase c/b piecemeal deglutition, decreased mastication, decreased bolus cohesion, premature spillage, and weak lingual manipulation.  Pharyngeal phase c/b reduced pharyngeal peristalsis + reduced BOT approximation to PPW, which results in mild to moderate vallecular and pyriform sinuses residuals (vallecula > pyriform sinuses for solids, pyriform sinuses > vallecula for liquids). No penetration nor aspiration appreciated with thin liquid via cup, Dysphagia 1 (puree), Dysphagia 2 (chopped), Dysphagia 3 (mechanical soft), or with 13 mm Barium tablet placed in puree. Once instance of sensed  aspiration with intake of large bolus of thin liquid via straw due to swallow initiation delay to pyriform sinuses and mistiming of swallow-breath cycle; despite strong reflexive cough aspirant was not fully cleared from trachea. Pt's level of swallow initiation was noted to vary with liquid consumption (over base of epiglottis and at pyriform sinuses) with level of swallow initiation for solids to be consistent at the vallecula. Pharyngeal stasis was partially cleared with reflexive secondary swallow." MRI brain 10/05/21: 1. 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 No associated hemorrhage or significant regional mass effect. 2. Loss of normal flow voids throughout the left ICA and MCA, consistent with slow flow and/or occlusion. 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 3. Underlying mild chronic microvascular ischemic disease. Tiny remote left ACA distribution infarct.   PAIN:  Are you having pain? Yes, shoulder, 5/10   PATIENT GOALS talk better   OBJECTIVE:    TODAY'S TREATMENT: Still awaiting completion of forms at home to send home trial communication device. Targeted LTG of awareness/repair of communication breakdowns. At sentence level, pt Id'd and corrected production by slowing rate when breakdowns occurred 90% of the time. At times reattempts were approximations; overall intelligibility during this task was 85% (known context). Pt generated words for sentence level prompts (responsive naming) with constraint of first letter (eg, "a desert that begins with c") 100% acc, however he required mod-max cues for spelling at word level.    PATIENT EDUCATION: Education details: Improvement in recognizing and repairing breakdowns Person educated: Patient Education method: Explanation, Demonstration, and written supports Education comprehension: verbalized understanding and needs further education     GOALS: Goals  reviewed with patient? Yes   SHORT TERM GOALS: Target date: 10 sessions   Patient will participate in clinical assessment of swallow function with goals added as needed. Baseline: Goal status: MET   2.  Pt will communicate emergency information 100% accuracy using visual aid if necessary.  Baseline:  Goal status: MET   3.  Pt will approximate personally relevant words and phrases >80% accuracy using script training and min visual cues for apraxia.   Baseline:  Goal status: MET   4.  Pt will generate at least 4 descriptors of target word 80% of the time using semantic feature analysis to improve abilities in wordfinding and resolving communication breakdowns.  Baseline:  Goal status: MET   5.  Pt will complete HEP for dysphagia with rare min A. Baseline:  Goal status: MET   6.  Pt will generate sentences using personally relevant words list for >80% intelligibility. Baseline: 50% intelligibility Goal status: MET   7.  Pt will improve expressive language skills by taking 4-6 turns in simple conversation, with supported conversation strategies if needed. Baseline:  Goal status: MET 8.  Pt will generate sentences moderately complex sentences 8-10 words >80% intelligibility, with self-correction/multimodal communication allowed. Baseline: 50% intelligibility Goal status: MET   9.  Patient will use intelligibility strategies and script training for 4-6 turn telephone exchange with family member or friend. Baseline:  Goal status: MET  10.  Pt will demonstrate operational competence using communication device by independently powering on/off his device, navigating forward/back and selecting icons. Baseline:  Goal status: INITIAL   11.  Pt will answer simple questions using AAC device to provide information re: emotional state, pain, and preferences. Baseline:  Goal status: INITIAL    12.  Pt will use AAC in social exchange of 2-4 turns to share and request information.   Baseline:  Goal status: INITIAL        LONG TERM GOALS: Target date: 04/25/2022    Pt will engage in 5-8 minutes simple-mod complex conversation re: topic of interest with supported conversation, aphasia compensations.  Baseline: 01/25/22: 5 turn exchange. Goal renewed 01/25/22 Goal status: IN PROGRESS   2.  Pt will ID and attempt repair of communication breakdowns >90% of the time in session.    Baseline: 01/25/22: requires occasional mod cues to initiate correction; renewed 01/25/22 Goal status: IN PROGRESS   3.  Pt/family will demonstrate knowledge of community resources and activities to support language/communication. Baseline: 01/25/22: referral completed to [facility]; pt has yet to register for group, renewed 01/25/22 Goal status: IN PROGRESS   4.  Pt will demonstrate use of swallowing precautions independently with s/sx aspiration <5% of trials. Baseline: 01/25/22: no overt s/sx when sipping water in sessions, pt and family report no issues at home with meals Goal status: DEFERRED       ASSESSMENT:   CLINICAL IMPRESSION: Patient continues with aphasia and verbal apraxia, with improvement in aphasia severity (mild) noted compared with initial evaluation (moderate). 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 Intelligibility is impacted significantly at the sentence level, although pt has improved his intelligibility in structured tasks (85% with self-correction and extended time, repetitions). Shows promise with script training to improve intelligibility for short interactions with family/community, however pt still remains limited in his ability to convey information quickly and outside of highly structured interactions. For this reason, have discussed benefits and potential for using a communication device to supplement and augment pt's verbal communication. Awaiting completion of paperwork to begin communication  device trial with [employer]. I recommend skilled ST to improve pt's language and motor speech skills to improve communication and reduce frustration.    OBJECTIVE IMPAIRMENTS include expressive language, receptive language, aphasia, apraxia, dysarthria, and dysphagia. These impairments are limiting patient from managing medications, managing appointments, managing finances, household responsibilities, ADLs/IADLs, effectively communicating at home and in community, and safety when swallowing. Factors affecting potential to achieve goals and functional outcome are cooperation/participation level; pt appears highly motivated with good family support. Patient will benefit from skilled SLP services to address above impairments and improve overall function.   REHAB POTENTIAL: Excellent   PLAN: SLP FREQUENCY: 2x/week   SLP DURATION: 12 weeks   PLANNED INTERVENTIONS: Aspiration precaution training, Pharyngeal strengthening exercises, Diet toleration management , Language facilitation, Environmental controls, Trials of upgraded texture/liquids, Cognitive reorganization, Internal/external aids, Functional tasks, Multimodal communication approach, SLP instruction and feedback, Compensatory strategies, and Patient/family education  [name], MS, CCC-SLP Speech-Language Pathologist [phone]   [name], [place] 03/27/2022, 9:08 AM  [facility] [address] [place], [place], [address] Phone: [phone]   Fax:  [phone]

## 2022-03-29 ENCOUNTER — Ambulatory Visit: Payer: Medicare Other | Admitting: Speech Pathology

## 2022-03-29 ENCOUNTER — Ambulatory Visit: Payer: Medicare Other | Admitting: Occupational Therapy

## 2022-03-29 ENCOUNTER — Ambulatory Visit: Payer: Medicare Other

## 2022-03-29 DIAGNOSIS — R4701 Aphasia: Secondary | ICD-10-CM

## 2022-03-29 DIAGNOSIS — R482 Apraxia: Secondary | ICD-10-CM | POA: Diagnosis not present

## 2022-03-29 DIAGNOSIS — R278 Other lack of coordination: Secondary | ICD-10-CM

## 2022-03-29 DIAGNOSIS — M6281 Muscle weakness (generalized): Secondary | ICD-10-CM

## 2022-03-29 NOTE — Therapy (Signed)
OUTPATIENT SPEECH LANGUAGE PATHOLOGY TREATMENT NOTE   Patient Name: Gerald Bauer MRN: [id number] DOB:[birth year], 77 y.o., male Today's Date: 03/29/2022  PCP: [name], PA-C Referring Provider:  [name], PA-C  END OF SESSION:   End of Session - 03/29/22 0910     Visit Number 34    Number of Visits 45    Date for SLP Re-Evaluation 04/25/22    SLP Start Time 0905    SLP Stop Time  1000    SLP Time Calculation (min) 55 min    Activity Tolerance Patient tolerated treatment well             Past Medical History:  Diagnosis Date   Acid reflux    Arthritis    hands   Carotid atherosclerosis    Diabetes mellitus without complication ([place])    Ganglion cyst 02/23/2015   Hyperlipemia    Hypertension    Joint pain in fingers of right hand 02/23/2015   Dominant hand   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 07/11/2015   Prostate cancer ([place])    history   Stroke (cerebrum) ([place]) 10/06/2021   Tachycardia    Tinea pedis of both feet 01/10/2017   Past Surgical History:  Procedure Laterality Date   [place]  2013   PROSTATECTOMY  2012   Patient Active Problem List   Diagnosis Date Noted   H/O prostatectomy 02/06/2022   Hemiparesis, aphasia, and dysphagia as late effects of stroke ([place]) 11/07/2021   Lower urinary tract symptoms (LUTS) 11/07/2021   Myalgia due to statin 02/21/2021   Type 2 diabetes mellitus without complication, without long-term current use of insulin ([place]) 02/21/2021   Aortic atherosclerosis ([place]) 04/27/2020   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   Onychomycosis of multiple toenails with type 2 diabetes mellitus ([place]) 07/19/2016   Carotid stenosis 03/13/2016   Hyperlipidemia associated with type 2 diabetes mellitus ([place])    Hypertension associated with type 2 diabetes mellitus ([place])    Personal history of prostate cancer    Carotid atherosclerosis    GERD  (gastroesophageal reflux disease) 08/20/2013    ONSET DATE: 10/05/2021    REFERRING DIAG: Cerebral infarction due to unspecified occlusion of left middle cerebral artery   THERAPY DIAG:  Verbal apraxia Verbal apraxia   Rationale for Evaluation and Treatment Rehabilitation   SUBJECTIVE STATEMENT:             "I-don't-want-to-rely-on-a-device."    Gerald Bauer accompanied by: self   PERTINENT HISTORY: Gerald Bauer  is a 77 year old male with history of left-sided carotid stenosis, non-insulin-dependent diabetes mellitus type 2, PAD, GERD, mild COPD, history of tobacco use, hyperlipidemia, hypertension, who presented 10/05/21 to [facility] ED with facial droop, expressive aphasia, and R sided weakness and found to have left MCA CVA. He was admitted to [facility] 7/17-10/31/21. Advanced to dysphagia 3/thin liquids by cup by time of d/c from CIR. 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 Difficult to fully assess cognition given severity of linguistic impairment; however, does present with decreased safety awareness, diminished problem-solving, emergent awareness, and mild impulsivity with movement."     DIAGNOSTIC FINDINGS: MBS 10/19/21: "Gerald Bauer presents with overall mild oropharyngeal dysphagia. Oral phase c/b piecemeal deglutition, decreased mastication, decreased bolus cohesion, premature spillage, and weak lingual manipulation. Pharyngeal phase c/b reduced pharyngeal peristalsis + reduced BOT approximation to PPW, which  results in mild to moderate vallecular and pyriform sinuses residuals (vallecula > pyriform sinuses for solids, pyriform sinuses > vallecula for liquids). No penetration nor aspiration appreciated with thin liquid via cup, Dysphagia 1 (puree), Dysphagia 2 (chopped), Dysphagia 3 (mechanical soft), or with 13 mm Barium tablet placed in puree. Once instance of sensed aspiration with intake of large bolus of thin liquid via  straw due to swallow initiation delay to pyriform sinuses and mistiming of swallow-breath cycle; despite strong reflexive cough aspirant was not fully cleared from trachea. Gerald Bauer's level of swallow initiation was noted to vary with liquid consumption (over base of epiglottis and at pyriform sinuses) with level of swallow initiation for solids to be consistent at the vallecula. Pharyngeal stasis was partially cleared with reflexive secondary swallow." MRI brain 10/05/21: 1. 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 No associated hemorrhage or significant regional mass effect. 2. Loss of normal flow voids throughout the left ICA and MCA, consistent with slow flow and/or occlusion. 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 3. Underlying mild chronic microvascular ischemic disease. Tiny remote left ACA distribution infarct.   PAIN:  Are you having pain? No   PATIENT GOALS talk better   OBJECTIVE:    TODAY'S TREATMENT: Communication device arrived, however per discussion with Gerald Bauer, he has opted to decline trial at this time and expressed today a preference to focus on verbal communication. Initiated conversation re: Gerald Bauer's expectations and goals for his speech. He rates his speech as a 5 (on a scale of 0-10, with 10 being his normal speech). Gerald Bauer states he would like to get to a 10, however feels he would be satisfied with an 8. Educated on stroke recovery timeline, his progress in therapy thus far, and that while he continues to make progress, this tends to slow over time. We opted to adjust goals and focus on verbal communication and intelligibility.     PATIENT EDUCATION: Education details: Improvement in recognizing and repairing breakdowns Person educated: Patient Education method: Explanation, Demonstration, and written supports Education comprehension: verbalized understanding and needs further education     GOALS: Goals  reviewed with patient? Yes   SHORT TERM GOALS: Target date: 10 sessions   Patient will participate in clinical assessment of swallow function with goals added as needed. Baseline: Goal status: MET   2.  Gerald Bauer will communicate emergency information 100% accuracy using visual aid if necessary.  Baseline:  Goal status: MET   3.  Gerald Bauer will approximate personally relevant words and phrases >80% accuracy using script training and min visual cues for apraxia.   Baseline:  Goal status: MET   4.  Gerald Bauer will generate at least 4 descriptors of target word 80% of the time using semantic feature analysis to improve abilities in wordfinding and resolving communication breakdowns.  Baseline:  Goal status: MET   5.  Gerald Bauer will complete HEP for dysphagia with rare min A. Baseline:  Goal status: MET   6.  Gerald Bauer will generate sentences using personally relevant words list for >80% intelligibility. Baseline: 50% intelligibility Goal status: MET   7.  Gerald Bauer will improve expressive language skills by taking 4-6 turns in simple conversation, with supported conversation strategies if needed. Baseline:  Goal status: MET 8.  Gerald Bauer will generate sentences moderately complex sentences 8-10 words >80% intelligibility, with self-correction/multimodal communication allowed. Baseline: 50% intelligibility Goal status: MET   9.  Patient will use intelligibility strategies and script training for 4-6 turn telephone  exchange with family member or friend. Baseline:  Goal status: MET  10.  Gerald Bauer will demonstrate operational competence using communication device by independently powering on/off his device, navigating forward/back and selecting icons. Baseline:  Goal status: DEFERRED   11.  Gerald Bauer will answer simple questions using AAC device to provide information re: emotional state, pain, and preferences. Baseline:  Goal status: DEFERRED   12.  Gerald Bauer will use AAC in social exchange of 2-4 turns to share and request information.   Baseline:  Goal status: DEFERRED  13.  Gerald Bauer will achieve 80% intelligibility in 6-8 turn exchange with less familiar listener or unknown context. Baseline: by visit 44 Goal status: INITIAL  14.  Gerald Bauer will monitor for signs of listener comprehension at sentence level and initiate repair when breakdowns occur in 80% of opportunities. Baseline: by visit 44 Goal status: INITIAL        LONG TERM GOALS: Target date: 04/25/2022    Gerald Bauer will engage in 5-8 minutes simple-mod complex conversation re: topic of interest with supported conversation, aphasia compensations.  Baseline: 01/25/22: 5 turn exchange. Goal renewed 01/25/22 Goal status: IN PROGRESS   2.  Gerald Bauer will ID and attempt repair of communication breakdowns >90% of the time in session.    Baseline: 01/25/22: requires occasional mod cues to initiate correction; renewed 01/25/22 Goal status: IN PROGRESS   3.  Gerald Bauer/family will demonstrate knowledge of community resources and activities to support language/communication. Baseline: 01/25/22: referral completed to [facility]; Gerald Bauer has yet to register for group, renewed 01/25/22 Goal status: IN PROGRESS   4.  Gerald Bauer will demonstrate use of swallowing precautions independently with s/sx aspiration <5% of trials. Baseline: 01/25/22: no overt s/sx when sipping water in sessions, Gerald Bauer and family report no issues at home with meals Goal status: DEFERRED       ASSESSMENT:   CLINICAL IMPRESSION: Patient continues with aphasia and verbal apraxia, with improvement in aphasia severity (mild) noted compared with initial evaluation (moderate). While language abilities have improved significantly, this reveals Gerald Bauer's apraxia to be more moderate-severe in nature. Intelligibility is impacted significantly at the sentence level, although Gerald Bauer has improved his intelligibility in structured tasks (85% with self-correction and extended time, repetitions). Has used some scripts to improve intelligibility for short interactions with  family/community, however Gerald Bauer still remains limited in his ability to convey information quickly and outside of highly structured interactions. For this reason, have discussed benefits and potential for using a communication device to supplement and augment Gerald Bauer's verbal communication; Gerald Bauer was initially interested in pursuing this but has opted to continue with focus on verbal communication. STG updated. I recommend skilled ST to improve Gerald Bauer's language and motor speech skills to improve communication and reduce frustration.    OBJECTIVE IMPAIRMENTS include expressive language, receptive language, aphasia, apraxia, dysarthria, and dysphagia. These impairments are limiting patient from managing medications, managing appointments, managing finances, household responsibilities, ADLs/IADLs, effectively communicating at home and in community, and safety when swallowing. Factors affecting potential to achieve goals and functional outcome are cooperation/participation level; Gerald Bauer appears highly motivated with good family support. Patient will benefit from skilled SLP services to address above impairments and improve overall function.   REHAB POTENTIAL: Excellent   PLAN: SLP FREQUENCY: 2x/week   SLP DURATION: 12 weeks   PLANNED INTERVENTIONS: Aspiration precaution training, Pharyngeal strengthening exercises, Diet toleration management , Language facilitation, Environmental controls, Trials of upgraded texture/liquids, Cognitive reorganization, Internal/external aids, Functional tasks, Multimodal communication approach, SLP instruction and feedback, Compensatory strategies, and Patient/family education  [place]  Rhunette Croft, [place], CCC-SLP Speech-Language Pathologist [phone]   [name], [place] 03/29/2022, 9:44 AM  [place] [facility] SERVICES [address] [place], [place], [address] Phone: [phone]   Fax:  [phone]

## 2022-03-29 NOTE — Therapy (Addendum)
Occupational Therapy Treatment Note MRN: [id number] DOB:11-Jan-1946, 77 y.o., male Today's Date: 03/06/2022  PCP: Threasa Alpha, PA REFERRING PROVIDER: Reesa Chew    OT End of Session - 03/29/22 2107     Visit Number 33    Number of Visits 53    Date for OT Re-Evaluation 04/17/22    Authorization Time Period 01/23/2022    OT Start Time 20    OT Stop Time 1100    OT Time Calculation (min) 40 min    Activity Tolerance Patient tolerated treatment well    Behavior During Therapy [facility] for tasks assessed/performed                    Past Medical History:  Diagnosis Date   Acid reflux    Arthritis    hands   Carotid atherosclerosis    Diabetes mellitus without complication ([place])    Ganglion cyst 02/23/2015   Hyperlipemia    Hypertension    Joint pain in fingers of right hand 02/23/2015   Dominant hand   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 07/11/2015   Prostate cancer ([place])    history   Stroke (cerebrum) ([place]) 10/06/2021   Tachycardia    Tinea pedis of both feet 01/10/2017   Past Surgical History:  Procedure Laterality Date   APPENDECTOMY  1973   CHOLECYSTECTOMY  2013   PROSTATECTOMY  2012   Patient Active Problem List   Diagnosis Date Noted   H/O prostatectomy 02/06/2022   Hemiparesis, aphasia, and dysphagia as late effects of stroke ([place]) 11/07/2021   Lower urinary tract symptoms (LUTS) 11/07/2021   Myalgia due to statin 02/21/2021   Type 2 diabetes mellitus without complication, without long-term current use of insulin ([place]) 02/21/2021   Aortic atherosclerosis ([place]) 04/27/2020   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 reports no pain today   PERTINENT HISTORY: Per chart, [name] is a 77 year old right-handed male with history of hypertension, hyperlipidemia, diabetes mellitus, prostate cancer/prostatectomy 2012, quit smoking 24 years ago.  Presented to [facility] 10/05/2021 with right side weakness/facial droop and expressive aphasia.  Blood pressure 155/102.  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  No associated hemorrhage or significant mass effect.    PAIN:  Are you having pain? No pain FALLS: Has patient fallen in last 6 months? Yes. Number of falls 3 since CVA  PLOF: Independent/retired Dealer  PATIENT GOALS : Pt points to his hand (wants to be able to use his hand)  OBJECTIVE:   HAND DOMINANCE: Right   FUNCTIONAL OUTCOME MEASURES: FOTO: 48  UUPPER EXTREMITY ROM      Active ROM Right eval Left Eval WNL Right 12/11/21 Right 01/23/2022 Right 03/15/2022  Shoulder flexion 90 (135)   95 (110) 95 97 sitting  Shoulder abduction 95 (110)   80 (90) 95 96  sitting  Wrist flex     54 60 62  Wrist ext     41 45 48    UPPER EXTREMITY MMT:      MMT Right eval Left Eval 5/5 Right 12/11/21 Right 03/15/22  Shoulder flexion 3-   -3 3-  Shoulder abduction 3-   -3 3-  Shoulder adduction         Shoulder extension         Shoulder internal rotation 3-       Shoulder external rotation 3-       Middle trapezius         Lower trapezius         Elbow flexion 4-   4 4+  Elbow extension 4+   5 5  Wrist flexion 4   NT d/t pain 4  Wrist extension 4-   NT d/t pain 4  (Blank rows = not tested)   HAND FUNCTION: Eval:         Grip strength: Right: 14  lbs; Left: 58 lbs, Lateral pinch: Right: 7 lbs, Left: 19 lbs, and 3 point pinch: Right: 2 lbs, Left: 16 lbs 10th visit: Grip strength: Right: 16  lbs;                                Lateral pinch: Right: 9  lbs;                            3 point pinch: Right: 5 lbs 20th visit: Grip strength: Right: 20 lbs; Left: 70 lbs, Lateral pinch: Right: 10 lbs, Left: 20 lbs, and 3 point pinch: Right: 9 lbs, Left: 20 lbs  30th visit: Grip strength: Right: 18 lbs; Left: 70 lbs, Lateral pinch: Right: 12 lbs, Left: 20 lbs, and 3 point pinch: Right: 8 lbs, Left: 20 lbs      Eval: COORDINATION: Finger Nose Finger test: difficult/lacks precision with reaching targets 9 Hole Peg test: Right: unable sec; Left: 27 sec Able to oppose 2nd and 3rd digits to thumb on R hand   10th visit 9 hole Peg test: Right: 11 min, 44 sec   20th visit:  9 hole Peg test: Right: 3 min 6 sec   30th visit:  9 hole Peg test: Right: 1 min 45 sec     TODAY'S TREATMENT:   Therapeutic Exercise:  Pt. tolerated right shoulder stretches for shoulder flexion, and abduction in supine. Pt. was able to stabilize  and hold for perturbations, perform shoulder protraction reps, followed by circular motion with the shoulder at 90 degrees of flexion with the elbow extended.  Pt. performed bilateral shoulder flexion, and chest presses with a 2# weight in supine.  Neuromuscular re-education:  Pt. Worked on right hand [facility] skills grasping half-inch flat marbles and worked on transitory skills moving them through his hand from his palm to the tip of his index finger and thumb in preparation for placing them into a container. Patient then worked on grasping half an inch circular tipped pegs and storing them in the palm of his hand. Pt. worked on thumb opposition and thumb movements needed to advance the objects in his hand.         PATIENT EDUCATION: Education details: HEP progression, Engineer, production Person educated: pt Education method:  Explanation and Verbal cues, handout Education comprehension: verbalized understanding, returned demo   HOME EXERCISE  PROGRAM: Theraputty, hand writing skills, table slides  GOALS: Goals reviewed with patient? Yes   SHORT TERM GOALS: Target date 04/26/2022     Pt will be indep to perform HEP for increasing strength and coordination throughout RUE.  Baseline: Not yet initiated; 10th: putty given but pt reports limited use, instructed in table slides and self PROM for R wrist/digits. 20th:  Pt performing exercises but requires continual upgrades and instruction 30th: Pt performing exercises but requires continual upgrades, modifications, and instruction as needed Goal status: ongoing   2.  Pt will manage clothing fasteners with extra time, using RUE as an assist  Baseline: unable; pt has elastic laces and wears elastic waisted pants; 10th: pt uses R hand as an assist to zip and button jeans with extra time; not yet tried small buttons on a shirt.  Pt continues to use elastic laces on shoes. 20th:  Improving with manipulation skills but still using elastic laces, able to button pants and shirt with increased time and effort. 30th: Pt. Is able to fasten pant fasteners, and button pants efficiently, ans independently. Goal status: met   LONG TERM GOALS: Target date:04/17/22   Pt will increase FOTO score to 55 or better to indicate improved performance with daily tasks.  Baseline: 48; 10th: 48; 20th: score:42 30th visit: 64 Goal status: Achieved   2.  Pt will increase R grip strength by 10 or more lbs to enable pt to hold and carry light ADL supplies without dropping.  Baseline: R grip 14#, L 58#; 10th visit: R grip 16; 20th visit: 20# 30th: 18#, Pt. Continues to have difficulty holding supplies securely. Goal status: ongoing   3.  Pt will increase RUE strength to be able to engage RUE into ADLs at least 50% of the time. Baseline: Pt using L non-dominant arm to manage ADLs; 10th visit: Son  reports he constantly sees pt attempt to use his R arm for daily tasks, but continues to be limited and still must use LUE for most tasks (see chart for RUE MMT) 20th:  continues to improve on strength and is working on consistency of use. 30th visit: Pt. Continues to use the right hand during daily ADL, and IALD tasks.  Goal status: ongoing   4.  Pt will complete 9 hole peg test on the R in 1 min or less to work towards ability to use R hand to pick up small ADL supplies.  Baseline: Pt can remove a peg but can not pick one up; 10th visit: R 9 hole completion 11 min 44 sec; 20th visit:   Pt improved to 3 mins and 6 secs, nearing goal. 30 visit: 1 min. & 45 sec. Goal status: revised from 3 min. Or less to 1 min. Or less.    ASSESSMENT:  CLINICAL IMPRESSION:  Pt. continues to present with no reports of right shoulder pain today. Pt. tolerated shoulder stabilization exercises, and stretches well. Pt. required cues for visual demonstration of the proper technique for translatory movements with the small objects. Pt. Was able to progress to smaller objects during fine motor coordination tasks.  She reports the small flat marbles were more difficult to advance through his hand secondary to the smooth texture of the surface. Pt. continues to present with limited RUE strength, grip strength, hand function and [facility] skills. Pt. continues to work on these skills to improve overall RUE functioning in order to improve, and maximize independence with ADLs, and IADL tasks.    PERFORMANCE DEFICITS  in functional skills including ADLs, IADLs, coordination, dexterity, ROM, strength, pain, flexibility, FMC, GMC, mobility, balance, continence, decreased knowledge of use of DME, and UE functional use, cognitive skills including safety awareness, and psychosocial skills including.   IMPAIRMENTS are limiting patient from ADLs, IADLs, leisure, and social participation.   COMORBIDITIES may have co-morbidities  that affects  occupational performance. Patient will benefit from skilled OT to address above impairments and improve overall function.  MODIFICATION OR ASSISTANCE TO COMPLETE EVALUATION: Min-Moderate modification of tasks or assist with assess necessary to complete an evaluation.  OT OCCUPATIONAL PROFILE AND HISTORY: Problem focused assessment: Including review of records relating to presenting problem.  CLINICAL DECISION MAKING: Moderate - several treatment options, min-mod task modification necessary  REHAB POTENTIAL: Good  EVALUATION COMPLEXITY: Moderate    PLAN: OT FREQUENCY: 2x/week  OT DURATION: 12 weeks  PLANNED INTERVENTIONS: self care/ADL training, therapeutic exercise, therapeutic activity, neuromuscular re-education, manual therapy, passive range of motion, balance training, functional mobility training, moist heat, cryotherapy, patient/family education, cognitive remediation/compensation, energy conservation, coping strategies training, and DME and/or AE instructions  RECOMMENDED OTHER SERVICES: N/A  CONSULTED AND AGREED WITH PLAN OF CARE: Patient and family member/caregiver  PLAN FOR NEXT SESSION: HEP progression, neuro re-ed, therapeutic exercises  Harrel Carina, MS, OTR/L   Harrel Carina, MS, OTR/L

## 2022-04-03 ENCOUNTER — Ambulatory Visit: Payer: Medicare Other

## 2022-04-03 ENCOUNTER — Ambulatory Visit: Payer: Medicare Other | Admitting: Speech Pathology

## 2022-04-03 DIAGNOSIS — R4701 Aphasia: Secondary | ICD-10-CM

## 2022-04-03 DIAGNOSIS — M6281 Muscle weakness (generalized): Secondary | ICD-10-CM

## 2022-04-03 DIAGNOSIS — R278 Other lack of coordination: Secondary | ICD-10-CM

## 2022-04-03 DIAGNOSIS — R482 Apraxia: Secondary | ICD-10-CM

## 2022-04-03 DIAGNOSIS — I63512 Cerebral infarction due to unspecified occlusion or stenosis of left middle cerebral artery: Secondary | ICD-10-CM

## 2022-04-03 NOTE — Therapy (Signed)
OUTPATIENT SPEECH LANGUAGE PATHOLOGY TREATMENT NOTE   Patient Name: Gerald Bauer MRN: [id number] DOB:13-Jan-1946, 77 y.o., male Today's Date: 04/03/2022  PCP: [name], PA-C Referring Provider:  [name], PA-C  END OF SESSION:   End of Session - 04/03/22 0905     Visit Number 35    Number of Visits 45    Date for SLP Re-Evaluation 04/25/22    SLP Start Time 0900    SLP Stop Time  1000    SLP Time Calculation (min) 60 min    Activity Tolerance Patient tolerated treatment well             Past Medical History:  Diagnosis Date   Acid reflux    Arthritis    hands   Carotid atherosclerosis    Diabetes mellitus without complication ([place])    Ganglion cyst 02/23/2015   Hyperlipemia    Hypertension    Joint pain in fingers of right hand 02/23/2015   Dominant hand   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 07/11/2015   Prostate cancer ([place])    history   Stroke (cerebrum) ([place]) 10/06/2021   Tachycardia    Tinea pedis of both feet 01/10/2017   Past Surgical History:  Procedure Laterality Date   [place]  2013   PROSTATECTOMY  2012   Patient Active Problem List   Diagnosis Date Noted   H/O prostatectomy 02/06/2022   Hemiparesis, aphasia, and dysphagia as late effects of stroke ([place]) 11/07/2021   Lower urinary tract symptoms (LUTS) 11/07/2021   Myalgia due to statin 02/21/2021   Type 2 diabetes mellitus without complication, without long-term current use of insulin ([place]) 02/21/2021   Aortic atherosclerosis ([place]) 04/27/2020   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   Onychomycosis of multiple toenails with type 2 diabetes mellitus ([place]) 07/19/2016   Carotid stenosis 03/13/2016   Hyperlipidemia associated with type 2 diabetes mellitus ([place])    Hypertension associated with type 2 diabetes mellitus ([place])    Personal history of prostate cancer    Carotid atherosclerosis    GERD  (gastroesophageal reflux disease) 08/20/2013    ONSET DATE: 10/05/2021    REFERRING DIAG: Cerebral infarction due to unspecified occlusion of left middle cerebral artery   THERAPY DIAG:  Verbal apraxia Verbal apraxia   Rationale for Evaluation and Treatment Rehabilitation   SUBJECTIVE STATEMENT:            "Sat in my chair."    Pt accompanied by: self   PERTINENT HISTORY: Pt  is a 77 year old male with history of left-sided carotid stenosis, non-insulin-dependent diabetes mellitus type 2, PAD, GERD, mild COPD, history of tobacco use, hyperlipidemia, hypertension, who presented 10/05/21 to [facility] ED with facial droop, expressive aphasia, and R sided weakness and found to have left MCA CVA. He was admitted to [facility] 7/17-10/31/21. Advanced to dysphagia 3/thin liquids by cup by time of d/c from CIR. 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 Difficult to fully assess cognition given severity of linguistic impairment; however, does present with decreased safety awareness, diminished problem-solving, emergent awareness, and mild impulsivity with movement."     DIAGNOSTIC FINDINGS: MBS 10/19/21: "Pt presents with overall mild oropharyngeal dysphagia. Oral phase c/b piecemeal deglutition, decreased mastication, decreased bolus cohesion, premature spillage, and weak lingual manipulation. Pharyngeal phase c/b reduced pharyngeal peristalsis + reduced BOT approximation to  PPW, which results in mild to moderate vallecular and pyriform sinuses residuals (vallecula > pyriform sinuses for solids, pyriform sinuses > vallecula for liquids). No penetration nor aspiration appreciated with thin liquid via cup, Dysphagia 1 (puree), Dysphagia 2 (chopped), Dysphagia 3 (mechanical soft), or with 13 mm Barium tablet placed in puree. Once instance of sensed aspiration with intake of large bolus of thin liquid via straw due to  swallow initiation delay to pyriform sinuses and mistiming of swallow-breath cycle; despite strong reflexive cough aspirant was not fully cleared from trachea. Pt's level of swallow initiation was noted to vary with liquid consumption (over base of epiglottis and at pyriform sinuses) with level of swallow initiation for solids to be consistent at the vallecula. Pharyngeal stasis was partially cleared with reflexive secondary swallow." MRI brain 10/05/21: 1. 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 No associated hemorrhage or significant regional mass effect. 2. Loss of normal flow voids throughout the left ICA and MCA, consistent with slow flow and/or occlusion. 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 3. Underlying mild chronic microvascular ischemic disease. Tiny remote left ACA distribution infarct.   PAIN:  Are you having pain? No   PATIENT GOALS talk better   OBJECTIVE:    TODAY'S TREATMENT: Targeted intelligibility with lesser known context; pt read question prompts and provided response in sentence form. Intelligibility for each response ranged from 60% to 100%. For 70% of his responses, pt required written/verbal cues for awareness of listener's need for clarification. Pt required mod cues for correcting errors or giving single word or phrase for context when breakdowns occurred. Used Taboo game cards to train pt in providing appropriate words for context; he generated 1-2 word responses (associations, physical description, location, etc) with rare min question cues.    PATIENT EDUCATION: Education details: Improvement in recognizing and repairing breakdowns Person educated: Patient Education method: Explanation, Demonstration, and written supports Education comprehension: verbalized understanding and needs further education     GOALS: Goals reviewed with patient? Yes   SHORT TERM GOALS: Target date: 10  sessions   Patient will participate in clinical assessment of swallow function with goals added as needed. Baseline: Goal status: MET   2.  Pt will communicate emergency information 100% accuracy using visual aid if necessary.  Baseline:  Goal status: MET   3.  Pt will approximate personally relevant words and phrases >80% accuracy using script training and min visual cues for apraxia.   Baseline:  Goal status: MET   4.  Pt will generate at least 4 descriptors of target word 80% of the time using semantic feature analysis to improve abilities in wordfinding and resolving communication breakdowns.  Baseline:  Goal status: MET   5.  Pt will complete HEP for dysphagia with rare min A. Baseline:  Goal status: MET   6.  Pt will generate sentences using personally relevant words list for >80% intelligibility. Baseline: 50% intelligibility Goal status: MET   7.  Pt will improve expressive language skills by taking 4-6 turns in simple conversation, with supported conversation strategies if needed. Baseline:  Goal status: MET 8.  Pt will generate sentences moderately complex sentences 8-10 words >80% intelligibility, with self-correction/multimodal communication allowed. Baseline: 50% intelligibility Goal status: MET   9.  Patient will use intelligibility strategies and script training for 4-6 turn telephone exchange with family member or friend. Baseline:  Goal status: MET  10.  Pt will demonstrate operational competence using communication device by  independently powering on/off his device, navigating forward/back and selecting icons. Baseline:  Goal status: DEFERRED   11.  Pt will answer simple questions using AAC device to provide information re: emotional state, pain, and preferences. Baseline:  Goal status: DEFERRED   12.  Pt will use AAC in social exchange of 2-4 turns to share and request information.  Baseline:  Goal status: DEFERRED  13.  Pt will achieve 80%  intelligibility in 6-8 turn exchange with less familiar listener or unknown context. Baseline: by visit 44 Goal status: INITIAL  14.  Pt will monitor for signs of listener comprehension at sentence level and initiate repair when breakdowns occur in 80% of opportunities. Baseline: by visit 44 Goal status: INITIAL        LONG TERM GOALS: Target date: 04/25/2022    Pt will engage in 5-8 minutes simple-mod complex conversation re: topic of interest with supported conversation, aphasia compensations.  Baseline: 01/25/22: 5 turn exchange. Goal renewed 01/25/22 Goal status: IN PROGRESS   2.  Pt will ID and attempt repair of communication breakdowns >90% of the time in session.    Baseline: 01/25/22: requires occasional mod cues to initiate correction; renewed 01/25/22 Goal status: IN PROGRESS   3.  Pt/family will demonstrate knowledge of community resources and activities to support language/communication. Baseline: 01/25/22: referral completed to TAP; pt has yet to register for group, renewed 01/25/22 Goal status: IN PROGRESS   4.  Pt will demonstrate use of swallowing precautions independently with s/sx aspiration <5% of trials. Baseline: 01/25/22: no overt s/sx when sipping water in sessions, pt and family report no issues at home with meals Goal status: DEFERRED       ASSESSMENT:   CLINICAL IMPRESSION: Patient continues with aphasia and verbal apraxia, with improvement in aphasia severity (mild) noted compared with initial evaluation (moderate). 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 Intelligibility is impacted significantly at the sentence level, although pt has improved his intelligibility in structured tasks. He has used some scripts to improve intelligibility for short interactions with family/community, however pt still remains limited in his ability to convey information quickly and outside of highly  structured interactions. For this reason, have discussed benefits and potential for using a communication device to supplement and augment pt's verbal communication; pt was initially interested in pursuing this but has opted to continue with focus on verbal communication. I recommend skilled ST to improve pt's language and motor speech skills to improve communication and reduce frustration.    OBJECTIVE IMPAIRMENTS include expressive language, receptive language, aphasia, apraxia, dysarthria, and dysphagia. These impairments are limiting patient from managing medications, managing appointments, managing finances, household responsibilities, ADLs/IADLs, effectively communicating at home and in community, and safety when swallowing. Factors affecting potential to achieve goals and functional outcome are cooperation/participation level; pt appears highly motivated with good family support. Patient will benefit from skilled SLP services to address above impairments and improve overall function.   REHAB POTENTIAL: Excellent   PLAN: SLP FREQUENCY: 2x/week   SLP DURATION: 12 weeks   PLANNED INTERVENTIONS: Aspiration precaution training, Pharyngeal strengthening exercises, Diet toleration management , Language facilitation, Environmental controls, Trials of upgraded texture/liquids, Cognitive reorganization, Internal/external aids, Functional tasks, Multimodal communication approach, SLP instruction and feedback, Compensatory strategies, and Patient/family education  [name], MS, CCC-SLP Speech-Language Pathologist [phone]   [name], [place] 04/03/2022, 9:06 AM  [place],  [place], [address] Phone: [phone]   Fax:  [phone]

## 2022-04-04 NOTE — Therapy (Signed)
Occupational Therapy Treatment Note MRN: [id number] DOB:26-Feb-1946, 77 y.o., male Today's Date: 03/06/2022  PCP: Threasa Alpha, PA REFERRING PROVIDER: Reesa Chew    OT End of Session - 04/04/22 1749     Visit Number 34    Number of Visits 62    Date for OT Re-Evaluation 04/17/22    Authorization Time Period 01/23/2022    OT Start Time 1000    OT Stop Time 1045    OT Time Calculation (min) 45 min    Activity Tolerance Patient tolerated treatment well    Behavior During Therapy [facility] for tasks assessed/performed                    Past Medical History:  Diagnosis Date   Acid reflux    Arthritis    hands   Carotid atherosclerosis    Diabetes mellitus without complication ([place])    Ganglion cyst 02/23/2015   Hyperlipemia    Hypertension    Joint pain in fingers of right hand 02/23/2015   Dominant hand   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 07/11/2015   Prostate cancer ([place])    history   Stroke (cerebrum) ([place]) 10/06/2021   Tachycardia    Tinea pedis of both feet 01/10/2017   Past Surgical History:  Procedure Laterality Date   [place]  2013   PROSTATECTOMY  2012   Patient Active Problem List   Diagnosis Date Noted   H/O prostatectomy 02/06/2022   Hemiparesis, aphasia, and dysphagia as late effects of stroke ([place]) 11/07/2021   Lower urinary tract symptoms (LUTS) 11/07/2021   Myalgia due to statin 02/21/2021   Type 2 diabetes mellitus without complication, without long-term current use of insulin ([place]) 02/21/2021   Aortic atherosclerosis ([place]) 04/27/2020   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   Onychomycosis of multiple toenails with type 2 diabetes mellitus ([place]) 07/19/2016   Carotid stenosis 03/13/2016   Hyperlipidemia associated with type 2 diabetes mellitus ([place])    Hypertension associated with type 2 diabetes mellitus ([place])    Personal history of prostate cancer    Carotid  atherosclerosis    GERD (gastroesophageal reflux disease) 08/20/2013    ONSET DATE: 10/05/21  REFERRING DIAG: L MCA CVA  THERAPY DIAG:  Muscle weakness (generalized)  Other lack of coordination  Left middle cerebral artery stroke ([place])  Rationale for Evaluation and Treatment Rehabilitation  SUBJECTIVE:   SUBJECTIVE STATEMENT:  DIL reports that pt is wanting to take a week off of therapy (unsure what week) and stay at his home on his own and work on his trains.  DIL reports they haven't finalized plans for this yet, but they will discuss.  PERTINENT HISTORY: Per chart, Yerik Zeringue is a 77 year old right-handed male with history of hypertension, hyperlipidemia, diabetes mellitus, prostate cancer/prostatectomy 2012, quit smoking 24 years ago.  Presented to [facility] 10/05/2021 with right side weakness/facial droop and expressive aphasia.  Blood pressure 155/102.  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  No associated hemorrhage or significant mass effect.    PAIN:  Are you having pain? 4-6, R shoulder; heat and rest effective to reduce pain  FALLS: Has patient fallen in last 6 months? Yes. Number of falls 3 since CVA  PLOF: Independent/retired Dealer  PATIENT GOALS : Pt points to his hand (wants to be able to use his hand)  OBJECTIVE:   HAND DOMINANCE:  Right   FUNCTIONAL OUTCOME MEASURES: FOTO: 48  UUPPER EXTREMITY ROM      Active ROM Right eval Left Eval WNL Right 12/11/21 Right 01/23/2022 Right 03/15/2022  Shoulder flexion 90 (135)   95 (110) 95 97 sitting  Shoulder abduction 95 (110)   80 (90) 95 96 sitting  Wrist flex     54 60 62  Wrist ext     41 45 48    UPPER EXTREMITY MMT:      MMT Right eval Left Eval 5/5 Right 12/11/21 Right 03/15/22  Shoulder flexion 3-   -3 3-  Shoulder abduction 3-   -3 3-  Shoulder adduction         Shoulder extension         Shoulder internal rotation 3-       Shoulder external  rotation 3-       Middle trapezius         Lower trapezius         Elbow flexion 4-   4 4+  Elbow extension 4+   5 5  Wrist flexion 4   NT d/t pain 4  Wrist extension 4-   NT d/t pain 4  (Blank rows = not tested)   HAND FUNCTION: Eval:         Grip strength: Right: 14 lbs; Left: 58 lbs, Lateral pinch: Right: 7 lbs, Left: 19 lbs, and 3 point pinch: Right: 2 lbs, Left: 16 lbs 10th visit: Grip strength: Right: 16  lbs;                                Lateral pinch: Right: 9  lbs;                            3 point pinch: Right: 5 lbs 20th visit: Grip strength: Right: 20 lbs; Left: 70 lbs, Lateral pinch: Right: 10 lbs, Left: 20 lbs, and 3 point pinch: Right: 9 lbs, Left: 20 lbs  30th visit: Grip strength: Right: 18 lbs; Left: 70 lbs, Lateral pinch: Right: 12 lbs, Left: 20 lbs, and 3 point pinch: Right: 8 lbs, Left: 20 lbs      Eval: COORDINATION: Finger Nose Finger test: difficult/lacks precision with reaching targets 9 Hole Peg test: Right: unable sec; Left: 27 sec Able to oppose 2nd and 3rd digits to thumb on R hand   10th visit 9 hole Peg test: Right: 11 min, 44 sec   20th visit:  9 hole Peg test: Right: 3 min 6 sec   30th visit:  9 hole Peg test: Right: 1 min 45 sec     TODAY'S TREATMENT:  Self Care: Practiced opening a variety of pill tops, with pt requiring cues to practice twisting open tops using the R hand to twist, after pt was noted to twist the bottom of the container with his L hand to compensate.  Practiced picking up small, medium, and larger sized pills (beads) from a non-skid surface on table top using R hand and placing pills into container.  Then practiced picking up 1 at a time, storing 3-5 in hand, and then discarding all together into pill bottle.    Therapeutic Exercise: Facilitated hand strengthening with use of hand gripper set at 17.9# to remove jumbo pegs from pegboard x3 trials using R hand.  Pt required intermittent tactile cues to reposition gripper in hand  to  prevent dropping gripper.  Rest breaks needed between sets.  Performed active bilat shoulder retraction x10 reps.  Performed table slides for R shoulder mobility, cues to keep within a pain free range.  Performed slides to facilitate forward flexion, abd, and horiz abd/add x10 each.    PATIENT EDUCATION: Education details: home safety (pt and family text each other to communicate needs; family also has video camera to monitor pt).  Pt to avoid stove top d/t memory deficits (unless supv is available) and otherwise use microwave. Person educated: pt/daughter Education method: Explanation and Verbal cues Education comprehension: verbalized understanding  HOME EXERCISE PROGRAM: Theraputty, hand writing skills, table slides  GOALS: Goals reviewed with patient? Yes   SHORT TERM GOALS: Target date 04/26/2022     Pt will be indep to perform HEP for increasing strength and coordination throughout RUE.  Baseline: Not yet initiated; 10th: putty given but pt reports limited use, instructed in table slides and self PROM for R wrist/digits. 20th:  Pt performing exercises but requires continual upgrades and instruction 30th: Pt performing exercises but requires continual upgrades, modifications, and instruction as needed Goal status: ongoing   2.  Pt will manage clothing fasteners with extra time, using RUE as an assist  Baseline: unable; pt has elastic laces and wears elastic waisted pants; 10th: pt uses R hand as an assist to zip and button jeans with extra time; not yet tried small buttons on a shirt.  Pt continues to use elastic laces on shoes. 20th:  Improving with manipulation skills but still using elastic laces, able to button pants and shirt with increased time and effort. 30th: Pt. Is able to fasten pant fasteners, and button pants efficiently, ans independently. Goal status: met   LONG TERM GOALS: Target date:04/17/22   Pt will increase FOTO score to 55 or better to indicate improved performance  with daily tasks.  Baseline: 48; 10th: 48; 20th: score:42 30th visit: 64 Goal status: Achieved   2.  Pt will increase R grip strength by 10 or more lbs to enable pt to hold and carry light ADL supplies without dropping.  Baseline: R grip 14#, L 58#; 10th visit: R grip 16; 20th visit: 20# 30th: 18#, Pt. Continues to have difficulty holding supplies securely. Goal status: ongoing   3.  Pt will increase RUE strength to be able to engage RUE into ADLs at least 50% of the time. Baseline: Pt using L non-dominant arm to manage ADLs; 10th visit: Son reports he constantly sees pt attempt to use his R arm for daily tasks, but continues to be limited and still must use LUE for most tasks (see chart for RUE MMT) 20th:  continues to improve on strength and is working on consistency of use. 30th visit: Pt. Continues to use the right hand during daily ADL, and IALD tasks.  Goal status: ongoing   4.  Pt will complete 9 hole peg test on the R in 1 min or less to work towards ability to use R hand to pick up small ADL supplies.  Baseline: Pt can remove a peg but can not pick one up; 10th visit: R 9 hole completion 11 min 44 sec; 20th visit:   Pt improved to 3 mins and 6 secs, nearing goal. 30 visit: 1 min. & 45 sec. Goal status: revised from 3 min. Or less to 1 min. Or less.    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  DIL reports they haven't finalized plans for this yet, but they will discuss.  OT reviewed home safety.  Pt and family text each other to communicate needs; family also has video camera to monitor pt.  Encouraged pt avoid stove top d/t memory deficits (unless supv is available) and otherwise use microwave.  Pt/DIL report pt mostly uses microwave.  Pt reporting 4-6/10 pain in the R shoulder.  Pt continues to manage pain with heat and rest and reports OTC pain meds are  minimally effective.  Pt practiced manipulation of small "pills" (beads) and opening containers using R hand to push and twist tops with vc for technique to avoid compensating with L hand.  Pt was able to stabilize the bottle in the L and twist caps with the R with multiple trials when opening a push and twist top.  Able to easily open a twist only top with vc to twist with the R hand.  Pt does well to pick up 1 bead at a time with only few dropped beads.  Pt has occasional dropping when storing multiple in hand, and has to compensate with shaking hand to move beads from palm to fingertips in order to discard them.  Pt. continues to work on increasing strength and coordination throughout the RUE to improve overall RUE functioning for greater indep with ADLs, and IADL tasks.    PERFORMANCE DEFICITS in functional skills including ADLs, IADLs, coordination, dexterity, ROM, strength, pain, flexibility, FMC, GMC, mobility, balance, continence, decreased knowledge of use of DME, and UE functional use, cognitive skills including safety awareness, and psychosocial skills including.   IMPAIRMENTS are limiting patient from ADLs, IADLs, leisure, and social participation.   COMORBIDITIES may have co-morbidities  that affects occupational performance. Patient will benefit from skilled OT to address above impairments and improve overall function.  MODIFICATION OR ASSISTANCE TO COMPLETE EVALUATION: Min-Moderate modification of tasks or assist with assess necessary to complete an evaluation.  OT OCCUPATIONAL PROFILE AND HISTORY: Problem focused assessment: Including review of records relating to presenting problem.  CLINICAL DECISION MAKING: Moderate - several treatment options, min-mod task modification necessary  REHAB POTENTIAL: Good  EVALUATION COMPLEXITY: Moderate    PLAN: OT FREQUENCY: 2x/week  OT DURATION: 12 weeks  PLANNED INTERVENTIONS: self care/ADL training, therapeutic exercise, therapeutic  activity, neuromuscular re-education, manual therapy, passive range of motion, balance training, functional mobility training, moist heat, cryotherapy, patient/family education, cognitive remediation/compensation, energy conservation, coping strategies training, and DME and/or AE instructions  RECOMMENDED OTHER SERVICES: N/A  CONSULTED AND AGREED WITH PLAN OF CARE: Patient and family member/caregiver  PLAN FOR NEXT SESSION: HEP progression, neuro re-ed, therapeutic exercises    Leta Speller, MS, OTR/L

## 2022-04-05 ENCOUNTER — Ambulatory Visit: Payer: Medicare Other | Admitting: Occupational Therapy

## 2022-04-05 ENCOUNTER — Ambulatory Visit: Payer: Medicare Other

## 2022-04-05 ENCOUNTER — Ambulatory Visit: Payer: Medicare Other | Admitting: Speech Pathology

## 2022-04-05 DIAGNOSIS — R4701 Aphasia: Secondary | ICD-10-CM

## 2022-04-05 DIAGNOSIS — M6281 Muscle weakness (generalized): Secondary | ICD-10-CM

## 2022-04-05 DIAGNOSIS — R482 Apraxia: Secondary | ICD-10-CM

## 2022-04-05 DIAGNOSIS — R278 Other lack of coordination: Secondary | ICD-10-CM

## 2022-04-05 NOTE — Therapy (Signed)
Occupational Therapy Treatment Note MRN: [id number] DOB:Sep 06, 1945, 77 y.o., male Today's Date: 03/06/2022  PCP: Threasa Alpha, PA REFERRING PROVIDER: Reesa Chew    OT End of Session - 04/05/22 2139     Visit Number 35    Number of Visits 22    Date for OT Re-Evaluation 04/17/22    Authorization Time Period 01/23/2022    OT Start Time 1015    OT Stop Time 1100    OT Time Calculation (min) 45 min    Activity Tolerance Patient tolerated treatment well    Behavior During Therapy [facility] for tasks assessed/performed                    Past Medical History:  Diagnosis Date   Acid reflux    Arthritis    hands   Carotid atherosclerosis    Diabetes mellitus without complication ([place])    Ganglion cyst 02/23/2015   Hyperlipemia    Hypertension    Joint pain in fingers of right hand 02/23/2015   Dominant hand   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 07/11/2015   Prostate cancer ([place])    history   Stroke (cerebrum) ([place]) 10/06/2021   Tachycardia    Tinea pedis of both feet 01/10/2017   Past Surgical History:  Procedure Laterality Date   [place]  2013   PROSTATECTOMY  2012   Patient Active Problem List   Diagnosis Date Noted   H/O prostatectomy 02/06/2022   Hemiparesis, aphasia, and dysphagia as late effects of stroke ([place]) 11/07/2021   Lower urinary tract symptoms (LUTS) 11/07/2021   Myalgia due to statin 02/21/2021   Type 2 diabetes mellitus without complication, without long-term current use of insulin ([place]) 02/21/2021   Aortic atherosclerosis (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  SUBJECTIVE STATEMENT:  DIL reports that pt is wanting to take a week off of therapy (unsure what week) and stay at his home on his own and work on his trains.  DIL reports they haven't finalized plans for this yet, but they will discuss.  PERTINENT HISTORY: Per chart, Gerald Bauer is a 77 year old right-handed male with history of hypertension, hyperlipidemia, diabetes mellitus, prostate cancer/prostatectomy 2012, quit smoking 24 years ago.  Presented to [facility] 10/05/2021 with right side weakness/facial droop and expressive aphasia.  Blood pressure 155/102.  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  No associated hemorrhage or significant mass effect.    PAIN:  Are you having pain? 4-6, R shoulder; heat and rest effective to reduce pain  FALLS: Has patient fallen in last 6 months? Yes. Number of falls 3 since CVA  PLOF: Independent/retired Dealer  PATIENT GOALS : Pt points to his hand (wants to be able to  use his hand)  OBJECTIVE:   HAND DOMINANCE: Right   FUNCTIONAL OUTCOME MEASURES: FOTO: 48  UUPPER EXTREMITY ROM      Active ROM Right eval Left Eval WNL Right 12/11/21 Right 01/23/2022 Right 03/15/2022  Shoulder flexion 90 (135)   95 (110) 95 97 sitting  Shoulder abduction 95 (110)   80 (90) 95 96 sitting  Wrist flex     54 60 62  Wrist ext     41 45 48    UPPER EXTREMITY MMT:      MMT Right eval Left Eval 5/5 Right 12/11/21 Right 03/15/22  Shoulder flexion 3-   -3 3-  Shoulder abduction 3-   -3 3-  Shoulder adduction         Shoulder extension         Shoulder internal  rotation 3-       Shoulder external rotation 3-       Middle trapezius         Lower trapezius         Elbow flexion 4-   4 4+  Elbow extension 4+   5 5  Wrist flexion 4   NT d/t pain 4  Wrist extension 4-   NT d/t pain 4  (Blank rows = not tested)   HAND FUNCTION: Eval:         Grip strength: Right: 14 lbs; Left: 58 lbs, Lateral pinch: Right: 7 lbs, Left: 19 lbs, and 3 point pinch: Right: 2 lbs, Left: 16 lbs 10th visit: Grip strength: Right: 16  lbs;                                Lateral pinch: Right: 9  lbs;                            3 point pinch: Right: 5 lbs 20th visit: Grip strength: Right: 20 lbs; Left: 70 lbs, Lateral pinch: Right: 10 lbs, Left: 20 lbs, and 3 point pinch: Right: 9 lbs, Left: 20 lbs  30th visit: Grip strength: Right: 18 lbs; Left: 70 lbs, Lateral pinch: Right: 12 lbs, Left: 20 lbs, and 3 point pinch: Right: 8 lbs, Left: 20 lbs      Eval: COORDINATION: Finger Nose Finger test: difficult/lacks precision with reaching targets 9 Hole Peg test: Right: unable sec; Left: 27 sec Able to oppose 2nd and 3rd digits to thumb on R hand   10th visit 9 hole Peg test: Right: 11 min, 44 sec   20th visit:  9 hole Peg test: Right: 3 min 6 sec   30th visit:  9 hole Peg test: Right: 1 min 45 sec     TODAY'S TREATMENT:   Therapeutic Exercise:  Pt. performed gross gripping with a gross grip strengthener. Pt. worked on sustaining grip while grasping pegs and reaching at various heights. The Gripper was set to  17.9 # of grip strength resistance.   Neuromuscular re-education:  Pt. worked on grasping, and manipulating 1/2" washers from a magnetic dish using 2pt. grasp pattern. Pt. worked on reaching up, stabilizing, and sustaining shoulder elevation while placing the washer over a small precise target on vertical dowels positioned at various angles.  Pt. worked on translatory movements storing the washers in the palm of his hand, and moving th washers from his palm to the tip of  his 2nd digit, and thumb  in preparation for placing them into the magnetic dish.    PATIENT EDUCATION: Education details: home safety (pt and family text each other to communicate needs; family also has video camera to monitor pt).  Pt to avoid stove top d/t memory deficits (unless supv is available) and otherwise use microwave. Person educated: pt/daughter Education method: Explanation and Verbal cues Education comprehension: verbalized understanding  HOME EXERCISE PROGRAM: Theraputty, hand writing skills, table slides  GOALS: Goals reviewed with patient? Yes   SHORT TERM GOALS: Target date 04/26/2022     Pt will be indep to perform HEP for increasing strength and coordination throughout RUE.  Baseline: Not yet initiated; 10th: putty given but pt reports limited use, instructed in table slides and self PROM for R wrist/digits. 20th:  Pt performing exercises but requires continual upgrades and instruction 30th: Pt performing exercises but requires continual upgrades, modifications, and instruction as needed Goal status: ongoing   2.  Pt will manage clothing fasteners with extra time, using RUE as an assist  Baseline: unable; pt has elastic laces and wears elastic waisted pants; 10th: pt uses R hand as an assist to zip and button jeans with extra time; not yet tried small buttons on a shirt.  Pt continues to use elastic laces on shoes. 20th:  Improving with manipulation skills but still using elastic laces, able to button pants and shirt with increased time and effort. 30th: Pt. Is able to fasten pant fasteners, and button pants efficiently, ans independently. Goal status: met   LONG TERM GOALS: Target date:04/17/22   Pt will increase FOTO score to 55 or better to indicate improved performance with daily tasks.  Baseline: 48; 10th: 48; 20th: score:42 30th visit: 64 Goal status: Achieved   2.  Pt will increase R grip strength by 10 or more lbs to enable pt to hold and carry light ADL  supplies without dropping.  Baseline: R grip 14#, L 58#; 10th visit: R grip 16; 20th visit: 20# 30th: 18#, Pt. Continues to have difficulty holding supplies securely. Goal status: ongoing   3.  Pt will increase RUE strength to be able to engage RUE into ADLs at least 50% of the time. Baseline: Pt using L non-dominant arm to manage ADLs; 10th visit: Son reports he constantly sees pt attempt to use his R arm for daily tasks, but continues to be limited and still must use LUE for most tasks (see chart for RUE MMT) 20th:  continues to improve on strength and is working on consistency of use. 30th visit: Pt. Continues to use the right hand during daily ADL, and IALD tasks.  Goal status: ongoing   4.  Pt will complete 9 hole peg test on the R in 1 min or less to work towards ability to use R hand to pick up small ADL supplies.  Baseline: Pt can remove a peg but can not pick one up; 10th visit: R 9 hole completion 11 min 44 sec; 20th visit:   Pt improved to 3 mins and 6 secs, nearing goal. 30 visit: 1 min. & 45 sec. Goal status: revised from 3 min. Or less to 1 min. Or less.    ASSESSMENT:  CLINICAL IMPRESSION:  Pt. and caregiver report that the Pt. Has cancelled his appointments for next week. Pt. 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 Home safety measures are in place, along with a video monitoring system, and frequent family checkins. Pt. tolerated grip  strengthening tasks well. Pt. Is able to grasp various sized flat washers in the palm of his right hand, and move the objects through his hand from the palm to the tip of his 2nd digit, and thumb in preparation for discarding them from his hand. Pt. continues to work on increasing strength and coordination throughout the RUE to improve overall RUE functioning for increased independence with ADLs, and IADL tasks.    PERFORMANCE DEFICITS in functional skills including ADLs, IADLs, coordination, dexterity, ROM,  strength, pain, flexibility, FMC, GMC, mobility, balance, continence, decreased knowledge of use of DME, and UE functional use, cognitive skills including safety awareness, and psychosocial skills including.   IMPAIRMENTS are limiting patient from ADLs, IADLs, leisure, and social participation.   COMORBIDITIES may have co-morbidities  that affects occupational performance. Patient will benefit from skilled OT to address above impairments and improve overall function.  MODIFICATION OR ASSISTANCE TO COMPLETE EVALUATION: Min-Moderate modification of tasks or assist with assess necessary to complete an evaluation.  OT OCCUPATIONAL PROFILE AND HISTORY: Problem focused assessment: Including review of records relating to presenting problem.  CLINICAL DECISION MAKING: Moderate - several treatment options, min-mod task modification necessary  REHAB POTENTIAL: Good  EVALUATION COMPLEXITY: Moderate    PLAN: OT FREQUENCY: 2x/week  OT DURATION: 12 weeks  PLANNED INTERVENTIONS: self care/ADL training, therapeutic exercise, therapeutic activity, neuromuscular re-education, manual therapy, passive range of motion, balance training, functional mobility training, moist heat, cryotherapy, patient/family education, cognitive remediation/compensation, energy conservation, coping strategies training, and DME and/or AE instructions  RECOMMENDED OTHER SERVICES: N/A  CONSULTED AND AGREED WITH PLAN OF CARE: Patient and family member/caregiver  PLAN FOR NEXT SESSION: HEP progression, neuro re-ed, therapeutic exercises   Harrel Carina, MS, OTR/L

## 2022-04-05 NOTE — Therapy (Signed)
OUTPATIENT SPEECH LANGUAGE PATHOLOGY TREATMENT NOTE   Patient Name: Gerald Bauer MRN: [id number] DOB:1945-08-02, 77 y.o., male Today's Date: 04/05/2022  PCP: [name], PA-C Referring Provider:  [name], PA-C  END OF SESSION:   End of Session - 04/05/22 0910     Visit Number 36    Number of Visits 45    Date for SLP Re-Evaluation 04/25/22    SLP Start Time 0900    SLP Stop Time  1000    SLP Time Calculation (min) 60 min    Activity Tolerance Patient tolerated treatment well             Past Medical History:  Diagnosis Date   Acid reflux    Arthritis    hands   Carotid atherosclerosis    Diabetes mellitus without complication ([place])    Ganglion cyst 02/23/2015   Hyperlipemia    Hypertension    Joint pain in fingers of right hand 02/23/2015   Dominant hand   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 07/11/2015   Prostate cancer ([place])    history   Stroke (cerebrum) ([place]) 10/06/2021   Tachycardia    Tinea pedis of both feet 01/10/2017   Past Surgical History:  Procedure Laterality Date   [place]  2013   PROSTATECTOMY  2012   Patient Active Problem List   Diagnosis Date Noted   H/O prostatectomy 02/06/2022   Hemiparesis, aphasia, and dysphagia as late effects of stroke ([place]) 11/07/2021   Lower urinary tract symptoms (LUTS) 11/07/2021   Myalgia due to statin 02/21/2021   Type 2 diabetes mellitus without complication, without long-term current use of insulin ([place]) 02/21/2021   Aortic atherosclerosis ([place]) 04/27/2020   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   Onychomycosis of multiple toenails with type 2 diabetes mellitus ([place]) 07/19/2016   Carotid stenosis 03/13/2016   Hyperlipidemia associated with type 2 diabetes mellitus ([place])    Hypertension associated with type 2 diabetes mellitus ([place])    Personal history of prostate cancer    Carotid atherosclerosis    GERD  (gastroesophageal reflux disease) 08/20/2013    ONSET DATE: 10/05/2021    REFERRING DIAG: Cerebral infarction due to unspecified occlusion of left middle cerebral artery   THERAPY DIAG:  Verbal apraxia Verbal apraxia   Rationale for Evaluation and Treatment Rehabilitation   SUBJECTIVE STATEMENT:           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    Pt accompanied by: self   PERTINENT HISTORY: Pt  is a 77 year old male with history of left-sided carotid stenosis, non-insulin-dependent diabetes mellitus type 2, PAD, GERD, mild COPD, history of tobacco use, hyperlipidemia, hypertension, who presented 10/05/21 to [facility] ED with facial droop, expressive aphasia, and R sided weakness and found to have left MCA CVA. He was admitted to [facility] 7/17-10/31/21. Advanced to dysphagia 3/thin liquids by cup by time of d/c from CIR. 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 Difficult to fully assess cognition given severity of linguistic impairment; however, does present with decreased safety awareness, diminished problem-solving, emergent awareness, and mild impulsivity with movement."     DIAGNOSTIC FINDINGS: MBS 10/19/21: "Pt presents with overall mild oropharyngeal dysphagia. Oral phase c/b piecemeal deglutition, decreased mastication, decreased bolus cohesion, premature spillage, and weak lingual manipulation. Pharyngeal phase c/b reduced  pharyngeal peristalsis + reduced BOT approximation to PPW, which results in mild to moderate vallecular and pyriform sinuses residuals (vallecula > pyriform sinuses for solids, pyriform sinuses > vallecula for liquids). No penetration nor aspiration appreciated with thin liquid via cup, Dysphagia 1 (puree), Dysphagia 2 (chopped), Dysphagia 3 (mechanical soft), or with 13 mm Barium tablet placed in puree. Once instance of sensed aspiration with intake of large  bolus of thin liquid via straw due to swallow initiation delay to pyriform sinuses and mistiming of swallow-breath cycle; despite strong reflexive cough aspirant was not fully cleared from trachea. Pt's level of swallow initiation was noted to vary with liquid consumption (over base of epiglottis and at pyriform sinuses) with level of swallow initiation for solids to be consistent at the vallecula. Pharyngeal stasis was partially cleared with reflexive secondary swallow." MRI brain 10/05/21: 1. 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 No associated hemorrhage or significant regional mass effect. 2. Loss of normal flow voids throughout the left ICA and MCA, consistent with slow flow and/or occlusion. 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 3. Underlying mild chronic microvascular ischemic disease. Tiny remote left ACA distribution infarct.   PAIN:  Are you having pain? Yes, 5/10, right shoulder   PATIENT GOALS talk better   OBJECTIVE:    TODAY'S TREATMENT: Facilitated 6 turn exchange re: pt's plans next week was 85% intelligible. Targeted script training for sharing plans with OT and making phone call to schedule a haircut; pt wrote script for hairdresser, with mod assistance to revise syntax for clarity and simpler sentence structure. Role played phone call x2 with pt using script; he returned to script independently when off script comments/questions were raised. Used talking stick to encourage slowing rate during conversation; significant improvement with rate reduction vs table taps.    PATIENT EDUCATION: Education details: Improvement in recognizing and repairing breakdowns Person educated: Patient Education method: Explanation, Demonstration, and written supports Education comprehension: verbalized understanding and needs further education     GOALS: Goals reviewed with patient? Yes   SHORT TERM GOALS:  Target date: 10 sessions   Patient will participate in clinical assessment of swallow function with goals added as needed. Baseline: Goal status: MET   2.  Pt will communicate emergency information 100% accuracy using visual aid if necessary.  Baseline:  Goal status: MET   3.  Pt will approximate personally relevant words and phrases >80% accuracy using script training and min visual cues for apraxia.   Baseline:  Goal status: MET   4.  Pt will generate at least 4 descriptors of target word 80% of the time using semantic feature analysis to improve abilities in wordfinding and resolving communication breakdowns.  Baseline:  Goal status: MET   5.  Pt will complete HEP for dysphagia with rare min A. Baseline:  Goal status: MET   6.  Pt will generate sentences using personally relevant words list for >80% intelligibility. Baseline: 50% intelligibility Goal status: MET   7.  Pt will improve expressive language skills by taking 4-6 turns in simple conversation, with supported conversation strategies if needed. Baseline:  Goal status: MET 8.  Pt will generate sentences moderately complex sentences 8-10 words >80% intelligibility, with self-correction/multimodal communication allowed. Baseline: 50% intelligibility Goal status: MET   9.  Patient will use intelligibility strategies and script training for 4-6 turn telephone exchange with family member or friend. Baseline:  Goal status: MET  10.  Pt will demonstrate operational  competence using communication device by independently powering on/off his device, navigating forward/back and selecting icons. Baseline:  Goal status: DEFERRED   11.  Pt will answer simple questions using AAC device to provide information re: emotional state, pain, and preferences. Baseline:  Goal status: DEFERRED   12.  Pt will use AAC in social exchange of 2-4 turns to share and request information.  Baseline:  Goal status: DEFERRED  13.  Pt will  achieve 80% intelligibility in 6-8 turn exchange with less familiar listener or unknown context. Baseline: by visit 44 Goal status: INITIAL  14.  Pt will monitor for signs of listener comprehension at sentence level and initiate repair when breakdowns occur in 80% of opportunities. Baseline: by visit 44 Goal status: INITIAL        LONG TERM GOALS: Target date: 04/25/2022    Pt will engage in 5-8 minutes simple-mod complex conversation re: topic of interest with supported conversation, aphasia compensations.  Baseline: 01/25/22: 5 turn exchange. Goal renewed 01/25/22 Goal status: IN PROGRESS   2.  Pt will ID and attempt repair of communication breakdowns >90% of the time in session.    Baseline: 01/25/22: requires occasional mod cues to initiate correction; renewed 01/25/22 Goal status: IN PROGRESS   3.  Pt/family will demonstrate knowledge of community resources and activities to support language/communication. Baseline: 01/25/22: referral completed to TAP; pt has yet to register for group, renewed 01/25/22 Goal status: IN PROGRESS   4.  Pt will demonstrate use of swallowing precautions independently with s/sx aspiration <5% of trials. Baseline: 01/25/22: no overt s/sx when sipping water in sessions, pt and family report no issues at home with meals Goal status: DEFERRED       ASSESSMENT:   CLINICAL IMPRESSION: Patient continues with aphasia and verbal apraxia, with improvement in aphasia severity (mild) noted compared with initial evaluation (moderate). 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 Intelligibility is impacted significantly at the sentence level, although pt has improved his intelligibility in structured tasks. He has used some scripts to improve intelligibility for short interactions with family/community, however pt still remains limited in his ability to convey information quickly and outside of  highly structured interactions. For this reason, have discussed benefits and potential for using a communication device to supplement and augment pt's verbal communication; pt was initially interested in pursuing this but has opted to continue with focus on verbal communication. I recommend skilled ST to improve pt's language and motor speech skills to improve communication and reduce frustration.    OBJECTIVE IMPAIRMENTS include expressive language, receptive language, aphasia, apraxia, dysarthria, and dysphagia. These impairments are limiting patient from managing medications, managing appointments, managing finances, household responsibilities, ADLs/IADLs, effectively communicating at home and in community, and safety when swallowing. Factors affecting potential to achieve goals and functional outcome are cooperation/participation level; pt appears highly motivated with good family support. Patient will benefit from skilled SLP services to address above impairments and improve overall function.   REHAB POTENTIAL: Excellent   PLAN: SLP FREQUENCY: 2x/week   SLP DURATION: 12 weeks   PLANNED INTERVENTIONS: Aspiration precaution training, Pharyngeal strengthening exercises, Diet toleration management , Language facilitation, Environmental controls, Trials of upgraded texture/liquids, Cognitive reorganization, Internal/external aids, Functional tasks, Multimodal communication approach, SLP instruction and feedback, Compensatory strategies, and Patient/family education  [name], MS, CCC-SLP Speech-Language Pathologist [phone]   [name], [place] 04/05/2022, 9:11 AM  [place]  [place], [place], [address] Phone: [phone]   Fax:  [phone]

## 2022-04-06 ENCOUNTER — Encounter: Payer: Self-pay | Admitting: Nurse Practitioner

## 2022-04-06 ENCOUNTER — Ambulatory Visit: Payer: Medicare Other | Attending: Nurse Practitioner | Admitting: Nurse Practitioner

## 2022-04-06 VITALS — BP 120/70 | HR 106 | Ht 67.0 in | Wt 161.0 lb

## 2022-04-06 DIAGNOSIS — R000 Tachycardia, unspecified: Secondary | ICD-10-CM

## 2022-04-06 DIAGNOSIS — I10 Essential (primary) hypertension: Secondary | ICD-10-CM

## 2022-04-06 DIAGNOSIS — I700 Atherosclerosis of aorta: Secondary | ICD-10-CM

## 2022-04-06 DIAGNOSIS — E785 Hyperlipidemia, unspecified: Secondary | ICD-10-CM

## 2022-04-06 DIAGNOSIS — R072 Precordial pain: Secondary | ICD-10-CM | POA: Diagnosis not present

## 2022-04-06 DIAGNOSIS — I6522 Occlusion and stenosis of left carotid artery: Secondary | ICD-10-CM

## 2022-04-06 NOTE — Progress Notes (Signed)
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  Past Medical History    Past Medical History:  Diagnosis Date   Acid reflux    Aortic atherosclerosis ([place])    a. Noted on CT Abd in 2012.   Arthritis    hands   Carotid atherosclerosis    a. 02/2018 U/S: RICA 1-32%, LICA 44-01%; b. 0/2725 MRA Head/Neck: near complete occlusion L ICA and L MCA. Patent RICA.   Diabetes mellitus without complication (HCC)    Diastolic dysfunction    a. 09/2021 Echo: EF 55-60%, no rwma, GrI DD, nl RV fxn, RVSP 14.10mHg.   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 09/2021 MRI/MRA Brain: Acute L MCA distribution infarct. Near complete occlusion L ICA and MCA.   Tachycardia    Tinea pedis of both feet 01/10/2017   Past Surgical History:  Procedure Laterality Date   APPENDECTOMY  1973   CHOLECYSTECTOMY  2013   PROSTATECTOMY  2012    Allergies  Allergies  Allergen Reactions   Amoxicillin Swelling and Other (See Comments)   Statins Other (See Comments)    Affects the liver   Welchol [Colesevelam Hcl] Other (See Comments)    affects the liver    History of Present Illness    77year old male with above past medical history including hypertension, hyperlipidemia, diastolic dysfunction, statin  intolerance, aortic atherosclerosis, diabetes, and carotid arterial disease status post left MCA territory stroke.  Prior abdominal CT in 2012 showed aortic atherosclerosis.  Prior carotid ultrasound in December 2019 showed 40 to 59% left internal carotid artery stenosis with 1 to 39% right internal carotid artery stenosis.  Unfortunately, in July 2023, he presented with left MCA territory stroke confirmed by MRI/MRA with near complete occlusion of the left internal carotid artery and left MCA.  Echocardiogram during admission showed normal LV function with grade 1 diastolic dysfunction and no significant valvular disease.  He was managed conservatively and following hospital discharge went to inpatient rehab for an additional 3 weeks and has subsequently received outpatient physical (has completed), speech, and occupational therapies.  Overall, Mr. KWidenfeels like he is recovering well.  He does have issues with right-sided weakness and aphasia.  He ambulates using a cane.  Family members with him today and notes that he has made significant improvement since his hospitalization.  He has chronic, stable dyspnea on exertion.  He notes that over the past several months, he will experience a somewhat sharp chest pain in the center of his chest, typically occurring at rest, lasting less than 30 seconds, resolving spontaneously.  He has never had the symptoms with activity.  He denies palpitations, PND, orthopnea, dizziness, syncope, edema, or early satiety.  Home Medications    Prior to Admission medications   Medication  Sig Start Date End Date Taking? Authorizing Provider  acetaminophen (TYLENOL) 325 MG tablet Take 2 tablets (650 mg total) by mouth every 4 (four) hours as needed for mild pain (or temp > 37.5 C (99.5 F)). Patient taking differently: Take 650 mg by mouth as needed for mild pain (or temp > 37.5 C (99.5 F)). 10/31/21  Yes Setzer, Edman Circle, PA-C  albuterol (VENTOLIN HFA) 108 (90 Base) MCG/ACT  inhaler Inhale 2 puffs into the lungs every 6 (six) hours as needed for wheezing or shortness of breath. 03/10/21  Yes [name], MD  amLODipine (NORVASC) 5 MG tablet TAKE 1 TABLET BY MOUTH DAILY 02/16/22  Yes [name], PA-C  aspirin EC 81 MG tablet Take 81 mg by mouth daily. Swallow whole.   Yes [provider]  Cholecalciferol (VITAMIN D3) 25 MCG (1000 UT) CAPS Take 1,000 Units by mouth daily.   Yes [provider]  clopidogrel (PLAVIX) 75 MG tablet Take 1 tablet (75 mg total) by mouth daily. 11/07/21  Yes [name], PA-C  Cyanocobalamin (VITAMIN B12) 500 MCG TABS Take 500 mcg by mouth daily.   Yes [provider]  Evolocumab (REPATHA SURECLICK) 161 MG/ML SOAJ INJECT '140MG'$  SUBCUTANEOUSLY  EVERY 2 WEEKS 02/01/22  Yes Gollan, Kathlene November, MD  metFORMIN (GLUCOPHAGE-XR) 500 MG 24 hr tablet Take 500 mg by mouth daily with breakfast.   Yes [provider]  Lancets (ONETOUCH DELICA PLUS [id number]) [place] USE TO CHECK BLOOD GLUCOSE  ONCE DAILY AS DIRECTED Patient not taking: Reported on 02/07/2022 08/17/21   [name], PA-C  magnesium gluconate (MAGONATE) 500 MG tablet Take 1 tablet (500 mg total) by mouth at bedtime. Patient not taking: Reported on 04/06/2022 10/31/21   Setzer, Edman Circle, PA-C  ONETOUCH VERIO test strip CHECK FINGERSTICK BLOOD  SUGARS ONCE DAILY Patient not taking: Reported on 04/06/2022 08/17/21   [name], PA-C  traMADol (ULTRAM) 50 MG tablet Take 1/2 - 1 tab (25-50 mg) PO at bedtime as needed for severe pain preventing sleep.  Try to limit use (only a few doses per week) for acute and chronic shoulder pain Patient not taking: Reported on 04/06/2022 02/08/22   [name], PA-C       Review of Systems    Ongoing right-sided weakness and aphasia.  Is able to ambulate with a cane.  Occasional sharp chest pain.  Chronic, stable dyspnea exertion.  He denies palpitations, PND, orthopnea, dizziness, syncope, edema, or early satiety.  All other  systems reviewed and are otherwise negative except as noted above.    Physical Exam    VS:  BP 120/70 (BP Location: Left Arm, Patient Position: Sitting, Cuff Size: Normal)   Pulse (!) 106   Ht '5\' 7"'$  (1.702 m)   Wt 161 lb (73 kg)   SpO2 98%   BMI 25.22 kg/m  , BMI Body mass index is 25.22 kg/m.     GEN: Well nourished, well developed, in no acute distress. HEENT: normal. Neck: Supple, no JVD, carotid bruits, or masses. Cardiac: RRR, tachycardic, no murmurs, rubs, or gallops. No clubbing, cyanosis, edema.  Radials 2+/PT 2+ and equal bilaterally.  Respiratory:  Respirations regular and unlabored, diminished breath sounds bilaterally. GI: Soft, nontender, nondistended, BS + x 4. MS: no deformity or atrophy. Skin: warm and dry, no rash. Neuro: Right upper and lower extremity weakness.  Expressive aphasia. Psych: Normal affect.  Accessory Clinical Findings    ECG personally reviewed by me today -sinus tachycardia, 106, abnormal R  wave progression- no acute changes.  Lab Results  Component Value Date   WBC 7.4 02/07/2022   HGB 15.3 02/07/2022   HCT 43.8 02/07/2022   MCV 90.3 02/07/2022   PLT 254 02/07/2022   Lab Results  Component Value Date   CREATININE 0.77 02/07/2022   BUN 13 02/07/2022   NA 140 02/07/2022   K 4.7 02/07/2022   CL 103 02/07/2022   CO2 29 02/07/2022   Lab Results  Component Value Date   ALT 15 02/07/2022   AST 14 02/07/2022   ALKPHOS 50 10/10/2021   BILITOT 0.7 02/07/2022   Lab Results  Component Value Date   CHOL 112 10/06/2021   HDL 47 10/06/2021   LDLCALC 44 10/06/2021   TRIG 105 10/06/2021   CHOLHDL 2.4 10/06/2021    Lab Results  Component Value Date   HGBA1C 6.8 (H) 02/07/2022    Assessment & Plan    1.  Precordial chest pain: Patient with a history of hypertension, hyperlipidemia, aortic atherosclerosis, and recent stroke with severe left internal carotid and left MCA disease, who has been recovering and generally doing well.  He  notes that approximately twice a month, he has an episode of sharp chest pain, typically occurring at rest, lasting less than 30 seconds without associated symptoms, and resolving spontaneously.  He has never had exertional symptoms.  In light of his multiple risk factors and despite somewhat atypical symptoms, we agreed to pursue risk stratification with a Lexiscan Myoview.  Risks and benefits discussed in detail and patient willing to proceed.  2.  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  Echo during admission showed normal LV function with grade 1 diastolic dysfunction.  He was found to have an occluded left internal carotid artery and left MCA.  He has completed physical therapy and continues to tolerate occupational and speech therapy well.  He remains on aspirin, Plavix, and Repatha therapy with an LDL of 44 in July.  3.  Essential hypertension: Well-controlled on amlodipine therapy.  4.  Sinus tachycardia: Patient with elevated heart rates dating back to at least 2019 but since the stroke, he has been more likely to trend in the low 100s.  He is asymptomatic.  He has not had a TSH since 2018 and I will follow-up today.  He has otherwise had lab work including CBC and basic metabolic panel in November, which were normal.  5.  Hyperlipidemia: LDL of 44 in July on [place] therapy.  6.  Type 2 diabetes mellitus: A1c of 6.8.  He is currently on metformin and managed by primary care.  7.  Disposition: Follow-up TSH.  Follow-up Lexiscan Myoview.  Follow-up in clinic in 3 months or sooner if necessary.   Murray Hodgkins, NP 04/06/2022, 10:22 AM

## 2022-04-06 NOTE — Patient Instructions (Addendum)
Medication Instructions:  - Your physician recommends that you continue on your current medications as directed. Please refer to the Current Medication list given to you today.  *If you need a refill on your cardiac medications before your next appointment, please call your pharmacy*   Lab Work: - Your physician recommends that you have lab work today:  [place] Entrance at [facility] 1st desk on the right to check in (REGISTRATION)  Lab hours: Monday- Friday (7:30 am- 5:30 pm);a  If you have labs (blood work) drawn today and your tests are completely normal, you will receive your results only by: [place] (if you have MyChart) OR A paper copy in the mail If you have any lab test that is abnormal or we need to change your treatment, we will call you to review the results.   Testing/Procedures: - Your physician has requested that you have a lexiscan myoview (cardiac nuclear scan).   [place]  Your caregiver has ordered a Stress Test with nuclear imaging. The purpose of this test is to evaluate the blood supply to your heart muscle. This procedure is referred to as a "Non-Invasive Stress Test." This is because other than having an IV started in your vein, nothing is inserted or "invades" your body. Cardiac stress tests are done to find areas of poor blood flow to the heart by determining the extent of coronary artery disease (CAD). Some patients exercise on a treadmill, which naturally increases the blood flow to your heart, while others who are  unable to walk on a treadmill due to physical limitations have a pharmacologic/chemical stress agent called Lexiscan . This medicine will mimic walking on a treadmill by temporarily increasing your coronary blood flow.   Please note: these test may take anywhere between 2-4 hours to complete  PLEASE REPORT TO [place] AT THE FIRST DESK WILL DIRECT YOU WHERE TO GO  Date of  Procedure:_____________________________________  Arrival Time for Procedure:______________________________  Instructions regarding medication:   _xx___ : Hold diabetes medication (METFORMIN) morning of procedure  _xx___:  You may take all of your other regular morning medications the day of your test with enough water to get them down safely  PLEASE NOTIFY THE OFFICE AT LEAST 24 HOURS IN ADVANCE IF YOU ARE UNABLE TO [place].  [phone] AND  PLEASE NOTIFY NUCLEAR MEDICINE AT [facility] AT LEAST 24 HOURS IN ADVANCE IF YOU ARE UNABLE TO KEEP YOUR APPOINTMENT. [phone]  How to prepare for your Myoview test:  Do not eat or drink after midnight No caffeine for 24 hours prior to test No smoking 24 hours prior to test. Your medication may be taken with water.  If your doctor stopped a medication because of this test, do not take that medication. Ladies, please do not wear dresses.  Skirts or pants are appropriate. Please wear a short sleeve shirt. No perfume, cologne or lotion. Wear comfortable walking shoes. No heels!   Follow-Up: At [facility], you and your health needs are our priority.  As part of our continuing mission to provide you with exceptional heart care, we have created designated Provider Care Teams.  These Care Teams include your primary Cardiologist (physician) and Advanced Practice Providers (APPs -  Physician Assistants and Nurse Practitioners) who all work together to provide you with the care you need, when you need it.  We recommend signing up for the patient portal called "MyChart".  Sign up information is provided  on this After Visit Summary.  MyChart is used to connect with patients for Virtual Visits (Telemedicine).  Patients are able to view lab/test results, encounter notes, upcoming appointments, etc.  Non-urgent messages can be sent to your provider as well.   To learn more about what you can do with MyChart, go to [email or web address].     Your next appointment:   3 month(s)  Provider:   You may see Ida Rogue, MD or one of the following Advanced Practice Providers on your designated Care Team:   Murray Hodgkins, NP   Other Instructions  Cardiac Nuclear Scan A cardiac nuclear scan is a test that measures blood flow to the heart when a person is resting and when exercising. The test looks for problems such as: Not enough blood reaching a portion of the heart. The heart muscle not working normally. You may need this test if: You have heart disease. You have had abnormal lab results. You have had heart surgery or a balloon procedure to open up blocked arteries (angioplasty) or a small mesh tube (stent). You have chest pain. You have shortness of breath. You have had a heart attack (myocardial infarction). In this test, a radioactive dye (tracer) is injected into your bloodstream. After the tracer has traveled to your heart, an imaging device is used to measure how much of the tracer is absorbed by or distributed to various areas of your heart. This procedure is usually done at a hospital or clinic and takes 2-4 hours. Tell a health care provider about: Any allergies you have. All medicines you are taking, including vitamins, herbs, eye drops, creams, and over-the-counter medicines. Any bleeding problems you have. Any surgeries you have had. Any medical conditions you have. Whether you are pregnant or may be pregnant. Any history of asthma or chronic lung disease. Any history of heart rhythm disorders or heart valve conditions. What are the risks? Your health care provider will talk with you about risks. These may include: Serious chest pain and heart attack. This is only a risk if the stress portion of the test is done. Fast or irregular heartbeats (palpitations). Sensation of warmth in your chest. This usually passes quickly. Allergic reaction to the tracer. This is generally mild and extremely  rare. Shortness of breath or trouble breathing. What happens before the procedure? Ask your health care provider about changing or stopping your regular medicines. This is especially important if you are taking diabetes medicines or blood thinners. Follow instructions from your health care provider about eating or drinking restrictions. Remove your jewelry on the day of the procedure. Ask your health care provider about nicotine or caffeine restrictions. What happens during the procedure? An IV will be inserted into one of your veins. Your health care provider will inject a small amount of radioactive tracer through the IV. You will wait for 20-40 minutes while the tracer travels through your bloodstream. Your heart activity will be monitored with an electrocardiogram (ECG). You will lie down on an exam table. Images of your heart will be taken for about 15-20 minutes. You may also have a stress test. For this test, one of the following may be done: You will exercise on a treadmill or stationary bike. While you exercise, your heart's activity will be monitored with an ECG, and your blood pressure will be checked. You will be given medicines that will increase blood flow to parts of your heart. This is done if you are unable to exercise. When blood flow to  your heart has peaked, a tracer will again be injected through the IV. After 20-40 minutes, you will get back on the exam table and have more images taken of your heart. Depending on the type of tracer used, scans may need to be repeated 3-4 hours later. Your IV line will be removed when the procedure is over. The procedure may vary among health care providers and hospitals. What happens after the procedure? Unless your health care provider tells you not to, return to your normal schedule, including diet, activities, international travel, and medicines. Unless your health care provider tells you not to, increase your fluid intake. Drinking  more fluid will help to flush the tracer from your body. Ask your health care provider, or the department that is doing the test: When will my results be ready? How will I get my results? What are my treatment options? What other tests do I need? What are my next steps? This information is not intended to replace advice given to you by your health care provider. Make sure you discuss any questions you have with your health care provider. Document Revised: 08/08/2021 Document Reviewed: 08/08/2021 Elsevier Patient Education  [place].

## 2022-04-10 ENCOUNTER — Encounter: Payer: Medicare Other | Admitting: Occupational Therapy

## 2022-04-10 ENCOUNTER — Ambulatory Visit: Payer: Medicare Other

## 2022-04-10 ENCOUNTER — Ambulatory Visit: Payer: Medicare Other | Admitting: Speech Pathology

## 2022-04-12 ENCOUNTER — Other Ambulatory Visit
Admission: RE | Admit: 2022-04-12 | Discharge: 2022-04-12 | Disposition: A | Discharge status: Home / Self Care | Payer: Medicare Other | Source: Ambulatory Visit | Attending: Nurse Practitioner | Admitting: Nurse Practitioner

## 2022-04-12 ENCOUNTER — Ambulatory Visit: Payer: Medicare Other

## 2022-04-12 ENCOUNTER — Ambulatory Visit: Payer: Medicare Other | Admitting: Speech Pathology

## 2022-04-12 ENCOUNTER — Encounter
Admission: RE | Admit: 2022-04-12 | Discharge: 2022-04-12 | Disposition: A | Discharge status: Home / Self Care | Payer: Medicare Other | Source: Ambulatory Visit | Attending: Nurse Practitioner | Admitting: Nurse Practitioner

## 2022-04-12 DIAGNOSIS — R000 Tachycardia, unspecified: Secondary | ICD-10-CM | POA: Diagnosis present

## 2022-04-12 DIAGNOSIS — R072 Precordial pain: Secondary | ICD-10-CM | POA: Insufficient documentation

## 2022-04-12 LAB — TSH: TSH: 2.268 u[IU]/mL (ref 0.350–4.500)

## 2022-04-12 LAB — NM MYOCAR MULTI W/SPECT W/WALL MOTION / EF
LV dias vol: 58 mL (ref 62–150)
LV sys vol: 23 mL
Nuc Stress EF: 1 %
Peak HR: 113 {beats}/min
Percent HR: 79 %
Rest HR: 83 {beats}/min
Rest Nuclear Isotope Dose: 10 mCi
SDS: 0
SRS: 7
SSS: 2
ST Depression (mm): 0 mm
Stress Nuclear Isotope Dose: 33 mCi
TID: 0.97

## 2022-04-12 MED ORDER — REGADENOSON 0.4 MG/5ML IV SOLN
0.4000 mg | Freq: Once | INTRAVENOUS | Status: AC
  Administered 2022-04-12: 0.4 mg via INTRAVENOUS

## 2022-04-12 MED ORDER — TECHNETIUM TC 99M TETROFOSMIN IV KIT
10.0000 | PACK | Freq: Once | INTRAVENOUS | Status: AC | PRN
  Administered 2022-04-12: 10.01 via INTRAVENOUS

## 2022-04-12 MED ORDER — TECHNETIUM TC 99M TETROFOSMIN IV KIT
30.0000 | PACK | Freq: Once | INTRAVENOUS | Status: AC | PRN
  Administered 2022-04-12: 32.97 via INTRAVENOUS

## 2022-04-17 ENCOUNTER — Ambulatory Visit: Payer: Medicare Other

## 2022-04-17 ENCOUNTER — Ambulatory Visit: Payer: Medicare Other | Admitting: Occupational Therapy

## 2022-04-17 ENCOUNTER — Ambulatory Visit: Payer: Medicare Other | Admitting: Speech Pathology

## 2022-04-17 DIAGNOSIS — M6281 Muscle weakness (generalized): Secondary | ICD-10-CM

## 2022-04-17 DIAGNOSIS — R482 Apraxia: Secondary | ICD-10-CM | POA: Diagnosis not present

## 2022-04-17 DIAGNOSIS — R4701 Aphasia: Secondary | ICD-10-CM

## 2022-04-17 NOTE — Therapy (Signed)
Occupational Therapy Treatment Note MRN: [id number] DOB:[birth year], 77 y.o., male Today's Date: 03/06/2022  PCP: Threasa Alpha, PA REFERRING PROVIDER: Reesa Chew    OT End of Session - 04/17/22 1202     Visit Number 36    Number of Visits 72    Date for OT Re-Evaluation 04/17/22    Authorization Time Period 01/23/2022    OT Start Time 1015    OT Stop Time 1100    OT Time Calculation (min) 45 min    Activity Tolerance Patient tolerated treatment well    Behavior During Therapy WFL for tasks assessed/performed                    Past Medical History:  Diagnosis Date   Acid reflux    Aortic atherosclerosis ([place])    a. Noted on CT Abd in 2012.   Arthritis    hands   Carotid atherosclerosis    a. 02/2018 U/S: RICA 1-09%, LICA 32-35%; b. 07/7320 MRA Head/Neck: near complete occlusion L ICA and L MCA. Patent RICA.   Diabetes mellitus without complication (HCC)    Diastolic dysfunction    a. 09/2021 Echo: EF 55-60%, no rwma, GrI DD, nl RV fxn, RVSP 14.40mHg.   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 09/2021 MRI/MRA Brain: Acute L MCA distribution infarct. Near complete occlusion L ICA and MCA.   Tachycardia    Tinea pedis of both feet 01/10/2017   Past Surgical History:  Procedure Laterality Date   APPENDECTOMY  1973   CHOLECYSTECTOMY  2013   PROSTATECTOMY  2012   Patient Active Problem List   Diagnosis Date Noted   H/O prostatectomy 02/06/2022   Hemiparesis, aphasia, and dysphagia as late effects of stroke ([facility] 11/07/2021   Lower urinary tract symptoms (LUTS) 11/07/2021   Myalgia due to statin 02/21/2021   Type 2 diabetes mellitus without complication, without long-term  current use of insulin ([facility] 02/21/2021   Aortic atherosclerosis ([facility] 04/27/2020   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   Onychomycosis of multiple toenails with type 2 diabetes mellitus ([facility] 07/19/2016   Carotid stenosis 03/13/2016   Hyperlipidemia associated with type 2 diabetes mellitus ([facility]    Hypertension associated with type 2 diabetes mellitus ([facility]    Personal history of prostate cancer    Carotid atherosclerosis    GERD (gastroesophageal reflux disease) 08/20/2013    ONSET DATE: 10/05/21  REFERRING DIAG: L MCA CVA  THERAPY DIAG:  Muscle weakness (generalized)  Rationale for Evaluation and Treatment Rehabilitation  SUBJECTIVE:   Pt. 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  SUBJECTIVE STATEMENT:  Gerald Bauer reports that pt is wanting to take a week off of therapy (unsure what week) and stay at his home on his own and work on his trains.  Gerald Bauer reports they haven't finalized plans for this yet, but they will discuss.  PERTINENT HISTORY: Per chart, WCayleb Jarniganis a 77year old right-handed male with history of hypertension, hyperlipidemia, diabetes mellitus, prostate cancer/prostatectomy 2012, quit smoking 24 years ago.  [facility] right [facility] weakness/facial droop and expressive aphasia.  Blood  pressure 155/102.  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  No associated hemorrhage or significant mass effect.    PAIN:  Are you having pain? 4-6, R shoulder; heat and rest effective to reduce pain  FALLS: Has patient fallen in last 6 months? Yes. Number of falls 3 since CVA  PLOF: Independent/retired Dealer  PATIENT GOALS : Pt points to his hand (wants to be able to use his hand)  OBJECTIVE:   HAND DOMINANCE: Right   FUNCTIONAL OUTCOME MEASURES: FOTO: 48  UUPPER EXTREMITY ROM      Active ROM Right eval Left Eval WNL Right 12/11/21 Right 01/23/2022 Right 03/15/2022  Shoulder  flexion 90 (135)   95 (110) 95 97 sitting  Shoulder abduction 95 (110)   80 (90) 95 96 sitting  Wrist flex     54 60 62  Wrist ext     41 45 48    UPPER EXTREMITY MMT:      MMT Right eval Left Eval 5/5 Right 12/11/21 Right 03/15/22  Shoulder flexion 3-   -3 3-  Shoulder abduction 3-   -3 3-  Shoulder adduction         Shoulder extension         Shoulder internal rotation 3-       Shoulder external rotation 3-       Middle trapezius         Lower trapezius         Elbow flexion 4-   4 4+  Elbow extension 4+   5 5  Wrist flexion 4   NT d/t pain 4  Wrist extension 4-   NT d/t pain 4  (Blank rows = not tested)   HAND FUNCTION: Eval:         Grip strength: Right: 14 lbs; Left: 58 lbs, Lateral pinch: Right: 7 lbs, Left: 19 lbs, and 3 point pinch: Right: 2 lbs, Left: 16 lbs 10th visit: Grip strength: Right: 16  lbs;                                Lateral pinch: Right: 9  lbs;                            3 point pinch: Right: 5 lbs 20th visit: Grip strength: Right: 20 lbs; Left: 70 lbs, Lateral pinch: Right: 10 lbs, Left: 20 lbs, and 3 point pinch: Right: 9 lbs, Left: 20 lbs  30th visit: Grip strength: Right: 18 lbs; Left: 70 lbs, Lateral pinch: Right: 12 lbs, Left: 20 lbs, and 3 point pinch: Right: 8 lbs, Left: 20 lbs      Eval: COORDINATION: Finger Nose Finger test: difficult/lacks precision with reaching targets 9 Hole Peg test: Right: unable sec; Left: 27 sec Able to oppose 2nd and 3rd digits to thumb on R hand   10th visit 9 hole Peg test: Right: 11 min, 44 sec   20th visit:  9 hole Peg test: Right: 3 min 6 sec   30th visit:  9 hole Peg test: Right: 1 min 45 sec     TODAY'S TREATMENT:   Therapeutic Exercise:  Pt. performed gross gripping with a gross grip strengthener. Pt. worked on sustaining grip while grasping pegs and reaching at various heights. The gripper was set to  23.4# of grip strength resistance for 1 peg,  change to 17.9# for 5 pegs, and 11.2# for the remainder  of the pegs. Pt. worked on pinch strengthening in the left hand for lateral, and 3pt. pinch using yellow, red, green, and blue resistive clips. Pt. worked on placing the clips at a horizontal angle. Pt. worked on translatory movements moving the clips from one pinch position to another. Tactile and verbal cues were required for eliciting the desired movement.   Pt. reports that being home for the week went well. 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 Pt. reports that the week ended early due to an unanticipated MD appointment during the week. Pt. reports having difficulty at home with opening the tabs on soup cans. Pt. education was provided about compensatory strategies. Pt. Required the grip strength resistance to be modified multiple times to a lower resistive setting. Pt. required modeling, and visual demonstration for translatory movements with the resistive clips. Pt. continues to work on increasing strength and coordination throughout the RUE to improve overall RUE functioning for increased independence with ADLs, and IADL tasks.       PATIENT EDUCATION: Education details:  compensatory strategies for accessing soup  Person educated: pt/daughter Education method: Merchandiser, retail cues Education comprehension: verbalized understanding  HOME EXERCISE PROGRAM: Theraputty, hand writing skills, table slides  GOALS: Goals reviewed with patient? Yes   SHORT TERM GOALS: Target date 04/26/2022     Pt will be indep to perform HEP for increasing strength and coordination throughout RUE.  Baseline: Not yet initiated; 10th: putty given but pt reports limited use, instructed in table slides and self PROM for R wrist/digits. 20th:  Pt performing exercises but requires continual upgrades and instruction 30th: Pt performing exercises but requires continual upgrades, modifications, and instruction as needed Goal status: ongoing   2.  Pt will manage clothing fasteners  with extra time, using RUE as an assist  Baseline: unable; pt has elastic laces and wears elastic waisted pants; 10th: pt uses R hand as an assist to zip and button jeans with extra time; not yet tried small buttons on a shirt.  Pt continues to use elastic laces on shoes. 20th:  Improving with manipulation skills but still using elastic laces, able to button pants and shirt with increased time and effort. 30th: Pt. Is able to fasten pant fasteners, and button pants efficiently, ans independently. Goal status: met   LONG TERM GOALS: Target date:04/17/22   Pt will increase FOTO score to 55 or better to indicate improved performance with daily tasks.  Baseline: 48; 10th: 48; 20th: score:42 30th visit: 64 Goal status: Achieved   2.  Pt will increase R grip strength by 10 or more lbs to enable pt to hold and carry light ADL supplies without dropping.  Baseline: R grip 14#, L 58#; 10th visit: R grip 16; 20th visit: 20# 30th: 18#, Pt. Continues to have difficulty holding supplies securely. Goal status: ongoing   3.  Pt will increase RUE strength to be able to engage RUE into ADLs at least 50% of the time. Baseline: Pt using L non-dominant arm to manage ADLs; 10th visit: Son reports he constantly sees pt attempt to use his R arm for daily tasks, but continues to be limited and still must use LUE for most tasks (see chart for RUE MMT) 20th:  continues to improve on strength and is working on consistency of use. 30th visit: Pt. Continues to use the right hand during daily ADL, and IALD tasks.  Goal status: ongoing   4.  Pt will complete 9 hole peg test on the R in 1 min or less to work towards ability to use R hand to pick up small ADL supplies.  Baseline: Pt can remove a peg but can not pick one up; 10th visit: R 9 hole completion 11 min 44 sec; 20th visit:   Pt improved to 3 mins and 6 secs, nearing goal. 30 visit: 1 min. & 45 sec. Goal status: revised from 3 min. Or less to 1 min. Or less.     ASSESSMENT:  CLINICAL IMPRESSION:  Pt. reports that being home for the week went well. 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 Pt. reports that the week ended early due to an unanticipated MD appointment during the week. Pt. reports having difficulty at home with opening the tabs on soup cans. Pt. education was provided about compensatory strategies. Pt. Required the grip strength resistance to be modified multiple times to a lower resistive setting. Pt. required modeling, and visual demonstration for translatory movements with the resistive clips. Pt. continues to work on increasing strength and coordination throughout the RUE to improve overall RUE functioning for increased independence with ADLs, and IADL tasks.    PERFORMANCE DEFICITS in functional skills including ADLs, IADLs, coordination, dexterity, ROM, strength, pain, flexibility, FMC, GMC, mobility, balance, continence, decreased knowledge of use of DME, and UE functional use, cognitive skills including safety awareness, and psychosocial skills including.   IMPAIRMENTS are limiting patient from ADLs, IADLs, leisure, and social participation.   COMORBIDITIES may have co-morbidities  that affects occupational performance. Patient will benefit from skilled OT to address above impairments and improve overall function.  MODIFICATION OR ASSISTANCE TO COMPLETE EVALUATION: Min-Moderate modification of tasks or assist with assess necessary to complete an evaluation.  OT OCCUPATIONAL PROFILE AND HISTORY: Problem focused assessment: Including review of records relating to presenting problem.  CLINICAL DECISION MAKING: Moderate - several treatment options, min-mod task modification necessary  REHAB POTENTIAL: Good  EVALUATION COMPLEXITY: Moderate    PLAN: OT FREQUENCY: 2x/week  OT DURATION: 12 weeks  PLANNED INTERVENTIONS: self care/ADL training, therapeutic exercise, therapeutic activity, neuromuscular  re-education, manual therapy, passive range of motion, balance training, functional mobility training, moist heat, cryotherapy, patient/family education, cognitive remediation/compensation, energy conservation, coping strategies training, and DME and/or AE instructions  RECOMMENDED OTHER SERVICES: N/A  CONSULTED AND AGREED WITH PLAN OF CARE: Patient and family member/caregiver  PLAN FOR NEXT SESSION: HEP progression, neuro re-ed, therapeutic exercises   Harrel Carina, MS, OTR/L

## 2022-04-17 NOTE — Therapy (Signed)
OUTPATIENT SPEECH LANGUAGE PATHOLOGY TREATMENT NOTE   Patient Name: Gerald Bauer MRN: [id number] DOB:[birth year], 77 y.o., male Today's Date: 04/17/2022  PCP: [name], PA-C Referring Provider:  [name], PA-C  END OF SESSION:   End of Session - 04/17/22 1054     Visit Number [id number]    Number of Visits 45    Date for SLP Re-Evaluation 04/25/22    SLP Start Time 0900    SLP Stop Time  1000    SLP Time Calculation (min) 60 min    Activity Tolerance Patient tolerated treatment well             Past Medical History:  Diagnosis Date   Acid reflux    Aortic atherosclerosis ([place])    a. Noted on CT Abd in 2012.   Arthritis    hands   Carotid atherosclerosis    a. 02/2018 U/S: RICA 8-82%, LICA 80-03%; b. 06/9177 MRA Head/Neck: near complete occlusion L ICA and L MCA. Patent RICA.   Diabetes mellitus without complication (HCC)    Diastolic dysfunction    a. 09/2021 Echo: EF 55-60%, no rwma, GrI DD, nl RV fxn, RVSP 14.69mHg.   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 09/2021 MRI/MRA Brain: Acute L MCA distribution infarct. Near complete occlusion L ICA and MCA.   Tachycardia    Tinea pedis of both feet 01/10/2017   Past Surgical History:  Procedure Laterality Date   APPENDECTOMY  1973   CHOLECYSTECTOMY  2013   PROSTATECTOMY  2012   Patient Active Problem List   Diagnosis Date Noted   H/O prostatectomy 02/06/2022   Hemiparesis, aphasia, and dysphagia as late effects of stroke ([facility] 11/07/2021   Lower urinary tract symptoms (LUTS) 11/07/2021   Myalgia due to statin 02/21/2021   Type 2 diabetes mellitus without complication, without long-term current use of insulin ([facility]  02/21/2021   Aortic atherosclerosis ([facility] 04/27/2020   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   Onychomycosis of multiple toenails with type 2 diabetes mellitus ([facility] 07/19/2016   Carotid stenosis 03/13/2016   Hyperlipidemia associated with type 2 diabetes mellitus ([facility]    Hypertension associated with type 2 diabetes mellitus ([facility] [place]    Personal history of prostate cancer    Carotid atherosclerosis    GERD (gastroesophageal reflux disease) 08/20/2013    ONSET DATE: 10/05/2021    REFERRING DIAG: Cerebral infarction due to unspecified occlusion of left middle cerebral artery   THERAPY DIAG:  Verbal apraxia Verbal apraxia   Rationale for Evaluation and Treatment Rehabilitation   SUBJECTIVE STATEMENT:           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    Pt accompanied by: daughter in law   PERTINENT HISTORY: Pt  is a 89year old male with history of left-sided carotid stenosis, non-insulin-dependent diabetes mellitus type 2, PAD, GERD, mild COPD, history of tobacco use, hyperlipidemia, hypertension, [facility], expressive aphasia, and R sided weakness and found to have left MCA CVA. [facility] Advanced to dysphagia 3/thin liquids by cup by time of d/c from  CIR. 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 Difficult to fully assess cognition given severity of linguistic impairment; however, does present with decreased safety awareness, diminished problem-solving, emergent awareness, and mild impulsivity with movement."     DIAGNOSTIC FINDINGS: MBS 10/19/21: "Pt presents with overall mild oropharyngeal dysphagia. Oral phase c/b piecemeal deglutition, decreased mastication, decreased bolus cohesion, premature spillage, and weak lingual manipulation. Pharyngeal phase c/b reduced pharyngeal peristalsis + reduced BOT  approximation to PPW, which results in mild to moderate vallecular and pyriform sinuses residuals (vallecula > pyriform sinuses for solids, pyriform sinuses > vallecula for liquids). No penetration nor aspiration appreciated with thin liquid via cup, Dysphagia 1 (puree), Dysphagia 2 (chopped), Dysphagia 3 (mechanical soft), or with 13 mm Barium tablet placed in puree. Once instance of sensed aspiration with intake of large bolus of thin liquid via straw due to swallow initiation delay to pyriform sinuses and mistiming of swallow-breath cycle; despite strong reflexive cough aspirant was not fully cleared from trachea. Pt's level of swallow initiation was noted to vary with liquid consumption (over base of epiglottis and at pyriform sinuses) with level of swallow initiation for solids to be consistent at the vallecula. Pharyngeal stasis was partially cleared with reflexive secondary swallow." MRI brain 10/05/21: 1. 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 No associated hemorrhage or significant regional mass effect. 2. Loss of normal flow voids throughout the left ICA and MCA, consistent with slow flow and/or occlusion. 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 3. Underlying mild chronic microvascular ischemic disease. Tiny remote left ACA distribution infarct.   PAIN:  Are you having pain? Yes, 4/10, right shoulder   PATIENT GOALS talk better    OBJECTIVE:    TODAY'S TREATMENT:  Pt seen for skilled Speech Therapy services targeting goals for improved communication. His daughter in law was in attendance. Both pt and family member were pleasant and cooperative with unfamiliar with unfamiliar therapist. Pt verbalized his goal for treatment was to "slow my speech so I can be understood".  Pt used his "talking stick" to facilitate pacing. It was noted that the beginning of a sentence was done with strong, louder voicing that  decreased significantly, trailing off as the sentence progressed. Pt was encouraged to take a break to breathe with longer utterances. Pt participated in writing script for scheduling an MRI. When asked what he would do if the MRI place called to set up the appointment,pt responded with "Just wait" (for Missy, DIL). Pt then participated in a conversation about his favorite foods with 80% intelligibility. Pt usually self corrected and repeated his responses using the talking stick.      PATIENT EDUCATION: Education details: Improvement in recognizing and repairing breakdowns Person educated: Patient Education method: Explanation, Demonstration, and written supports Education comprehension: verbalized understanding and needs further education     GOALS: Goals reviewed with patient? Yes   SHORT TERM GOALS: Target date: 10 sessions   Patient will participate in clinical assessment of swallow function with goals added as needed. Baseline: Goal status: MET   2.  Pt will communicate emergency information 100% accuracy using visual aid if necessary.  Baseline:  Goal status: MET   3.  Pt will approximate personally relevant words and phrases >80% accuracy using script training and min visual cues for apraxia.   Baseline:  Goal status: MET   4.  Pt will  generate at least 4 descriptors of target word 80% of the time using semantic feature analysis to improve abilities in wordfinding and resolving communication breakdowns.  Baseline:  Goal status: MET   5.  Pt will complete HEP for dysphagia with rare min A. Baseline:  Goal status: MET   6.  Pt will generate sentences using personally relevant words list for >80% intelligibility. Baseline: 50% intelligibility Goal status: MET   7.  Pt will improve expressive language skills by taking 4-6 turns in simple conversation, with supported conversation strategies if needed. Baseline:  Goal status: MET 8.  Pt will generate sentences moderately  complex sentences 8-10 words >80% intelligibility, with self-correction/multimodal communication allowed. Baseline: 50% intelligibility Goal status: MET   9.  Patient will use intelligibility strategies and script training for 4-6 turn telephone exchange with family member or friend. Baseline:  Goal status: MET  10.  Pt will demonstrate operational competence using communication device by independently powering on/off his device, navigating forward/back and selecting icons. Baseline:  Goal status: DEFERRED   11.  Pt will answer simple questions using AAC device to provide information re: emotional state, pain, and preferences. Baseline:  Goal status: DEFERRED   12.  Pt will use AAC in social exchange of 2-4 turns to share and request information.  Baseline:  Goal status: DEFERRED  13.  Pt will achieve 80% intelligibility in 6-8 turn exchange with less familiar listener or unknown context. Baseline: by visit 44 Goal status: INITIAL  14.  Pt will monitor for signs of listener comprehension at sentence level and initiate repair when breakdowns occur in 80% of opportunities. Baseline: by visit 44 Goal status: INITIAL        LONG TERM GOALS: Target date: 04/25/2022    Pt will engage in 5-8 minutes simple-mod complex conversation re: topic of interest with supported conversation, aphasia compensations.  Baseline: 01/25/22: 5 turn exchange. Goal renewed 01/25/22 Goal status: IN PROGRESS   2.  Pt will ID and attempt repair of communication breakdowns >90% of the time in session.    Baseline: 01/25/22: requires occasional mod cues to initiate correction; renewed 01/25/22 Goal status: IN PROGRESS   3.  Pt/family will demonstrate knowledge of community resources and activities to support language/communication. Baseline: 01/25/22: referral completed to [facility]; pt has yet to register for group, renewed 01/25/22 Goal status: IN PROGRESS   4.  Pt will demonstrate use of swallowing precautions  independently with s/sx aspiration <5% of trials. Baseline: 01/25/22: no overt s/sx when sipping water in sessions, pt and family report no issues at home with meals Goal status: DEFERRED       ASSESSMENT:   CLINICAL IMPRESSION: Patient continues with aphasia and verbal apraxia, with improvement in aphasia severity (mild) noted compared with initial evaluation (moderate). 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 Intelligibility is impacted significantly at the sentence level, although pt has improved his intelligibility in structured tasks. He has used some scripts to improve intelligibility for short interactions with family/community, however pt still remains limited in his ability to convey information quickly and outside of highly structured interactions. For this reason, have discussed benefits and potential for using a communication device to supplement and augment pt's verbal communication; pt was initially interested in pursuing this but has opted to continue with focus on verbal communication. I recommend skilled ST to improve pt's language and motor speech skills to improve communication and reduce frustration.    OBJECTIVE  IMPAIRMENTS include expressive language, receptive language, aphasia, apraxia, dysarthria, and dysphagia. These impairments are limiting patient from managing medications, managing appointments, managing finances, household responsibilities, ADLs/IADLs, effectively communicating at home and in community, and safety when swallowing. Factors affecting potential to achieve goals and functional outcome are cooperation/participation level; pt appears highly motivated with good family support. Patient will benefit from skilled SLP services to address above impairments and improve overall function.   REHAB POTENTIAL: Excellent   PLAN: SLP FREQUENCY: 2x/week   SLP DURATION: 12 weeks   PLANNED  INTERVENTIONS: Aspiration precaution training, Pharyngeal strengthening exercises, Diet toleration management , Language facilitation, Environmental controls, Trials of upgraded texture/liquids, Cognitive reorganization, Internal/external aids, Functional tasks, Multimodal communication approach, SLP instruction and feedback, Compensatory strategies, and Patient/family education  [place] B. [facility], CCC-SLP Speech Language Pathologist [phone]  Shonna Chock, [place] 04/17/2022, 10:57 AM  [place] at [facility] [place], [place], [address] Phone: [phone]   Fax:  [phone]

## 2022-04-19 ENCOUNTER — Ambulatory Visit: Payer: Medicare Other

## 2022-04-19 ENCOUNTER — Ambulatory Visit: Payer: Medicare Other | Admitting: Occupational Therapy

## 2022-04-19 ENCOUNTER — Ambulatory Visit: Payer: Medicare Other | Admitting: Speech Pathology

## 2022-04-19 DIAGNOSIS — R482 Apraxia: Secondary | ICD-10-CM | POA: Diagnosis not present

## 2022-04-19 DIAGNOSIS — F40240 Claustrophobia: Secondary | ICD-10-CM | POA: Insufficient documentation

## 2022-04-19 DIAGNOSIS — R278 Other lack of coordination: Secondary | ICD-10-CM

## 2022-04-19 DIAGNOSIS — M6281 Muscle weakness (generalized): Secondary | ICD-10-CM

## 2022-04-19 NOTE — Therapy (Signed)
OUTPATIENT SPEECH LANGUAGE PATHOLOGY TREATMENT NOTE   Patient Name: Gerald Bauer MRN: [id number] DOB:05-20-1945, 77 y.o., male Today's Date: 04/19/2022  PCP: [name], PA-C Referring Provider:  [name], PA-C  END OF SESSION:   End of Session - 04/19/22 0959     Visit Number [id number]    Number of Visits 45    Date for SLP Re-Evaluation 04/25/22    SLP Start Time 0900    SLP Stop Time  1000    SLP Time Calculation (min) 60 min    Activity Tolerance Patient tolerated treatment well             Past Medical History:  Diagnosis Date   Acid reflux    Aortic atherosclerosis ([place])    a. Noted on CT Abd in 2012.   Arthritis    hands   Carotid atherosclerosis    a. 02/2018 U/S: RICA 4-09%, LICA 81-19%; b. 03/4780 MRA Head/Neck: near complete occlusion L ICA and L MCA. Patent RICA.   Diabetes mellitus without complication (HCC)    Diastolic dysfunction    a. 09/2021 Echo: EF 55-60%, no rwma, GrI DD, nl RV fxn, RVSP 14.60mHg.   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 09/2021 MRI/MRA Brain: Acute L MCA distribution infarct. Near complete occlusion L ICA and MCA.   Tachycardia    Tinea pedis of both feet 01/10/2017   Past Surgical History:  Procedure Laterality Date   APPENDECTOMY  1973   CHOLECYSTECTOMY  2013   PROSTATECTOMY  2012   Patient Active Problem List   Diagnosis Date Noted   H/O prostatectomy 02/06/2022   Hemiparesis, aphasia, and dysphagia as late effects of stroke ([facility] 11/07/2021   Lower urinary tract symptoms (LUTS) 11/07/2021   Myalgia due to statin 02/21/2021   Type 2 diabetes mellitus without complication, without long-term current use of insulin ([facility]  02/21/2021   Aortic atherosclerosis ([facility] 04/27/2020   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   Onychomycosis of multiple toenails with type 2 diabetes mellitus ([facility] 07/19/2016   Carotid stenosis 03/13/2016   Hyperlipidemia associated with type 2 diabetes mellitus ([facility] [place]    Hypertension associated with type 2 diabetes mellitus ([facility] [place]    Personal history of prostate cancer    Carotid atherosclerosis    GERD (gastroesophageal reflux disease) 08/20/2013    ONSET DATE: 10/05/2021    REFERRING DIAG: Cerebral infarction due to unspecified occlusion of left middle cerebral artery   THERAPY DIAG:  Verbal apraxia Verbal apraxia   Rationale for Evaluation and Treatment Rehabilitation   SUBJECTIVE STATEMENT:           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    Pt accompanied by: daughter in law   PERTINENT HISTORY: Pt  is a 89year old male with history of left-sided carotid stenosis, non-insulin-dependent diabetes mellitus type 2, PAD, GERD, mild COPD, history of tobacco use, hyperlipidemia, hypertension, [facility]op, expressive aphasia, and R sided weakness and found to have left MCA CVA. [facility] Advanced to dysphagia 3/thin liquids by cup by time of d/c from  CIR. 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 Difficult to fully assess cognition given severity of linguistic impairment; however, does present with decreased safety awareness, diminished problem-solving, emergent awareness, and mild impulsivity with movement."     DIAGNOSTIC FINDINGS: MBS 10/19/21: "Pt presents with overall mild oropharyngeal dysphagia. Oral phase c/b piecemeal deglutition, decreased mastication, decreased bolus cohesion, premature spillage, and weak lingual manipulation. Pharyngeal phase c/b reduced pharyngeal peristalsis + reduced BOT  approximation to PPW, which results in mild to moderate vallecular and pyriform sinuses residuals (vallecula > pyriform sinuses for solids, pyriform sinuses > vallecula for liquids). No penetration nor aspiration appreciated with thin liquid via cup, Dysphagia 1 (puree), Dysphagia 2 (chopped), Dysphagia 3 (mechanical soft), or with 13 mm Barium tablet placed in puree. Once instance of sensed aspiration with intake of large bolus of thin liquid via straw due to swallow initiation delay to pyriform sinuses and mistiming of swallow-breath cycle; despite strong reflexive cough aspirant was not fully cleared from trachea. Pt's level of swallow initiation was noted to vary with liquid consumption (over base of epiglottis and at pyriform sinuses) with level of swallow initiation for solids to be consistent at the vallecula. Pharyngeal stasis was partially cleared with reflexive secondary swallow." MRI brain 10/05/21: 1. 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 No associated hemorrhage or significant regional mass effect. 2. Loss of normal flow voids throughout the left ICA and MCA, consistent with slow flow and/or occlusion. 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 3. Underlying mild chronic microvascular ischemic disease. Tiny remote left ACA distribution infarct.   PAIN:  Are you having pain? Yes, 4/10, right shoulder   PATIENT GOALS talk better    OBJECTIVE:    TODAY'S TREATMENT:   Pt seen for skilled Speech Therapy services targeting goals for improved communication. His daughter in law was in attendance.  Pt returned homework completed. He did not follow the directions, but instead wrote 16 sentences utilizing the category name and the 2 words in bold type at the top of the page. SLP facilitated identification of category members on homework pages. Pt was able to verbalize at least 2 category members for each category on  the 2 papers. Pt engaged in short conversation about several category members (ex. [place]). Pt required min cues to use his talking stick today.      PATIENT EDUCATION: Education details: Improvement in recognizing and repairing breakdowns Person educated: Patient Education method: Explanation, Demonstration, and written supports Education comprehension: verbalized understanding and needs further education     GOALS: Goals reviewed with patient? Yes   SHORT TERM GOALS: Target date: 10 sessions   Patient will participate in clinical assessment of swallow function with goals added as needed. Baseline: Goal status: MET   2.  Pt will communicate emergency information 100% accuracy using visual aid if necessary.  Baseline:  Goal status: MET   3.  Pt will approximate personally relevant words and phrases >80% accuracy using script training and min visual cues for apraxia.   Baseline:  Goal status: MET   4.  Pt will generate at least 4 descriptors of target word 80% of the time using semantic feature analysis to improve abilities in wordfinding and resolving communication breakdowns.  Baseline:  Goal status: MET   5.  Pt will complete HEP for dysphagia with rare min A. Baseline:  Goal status: MET  6.  Pt will generate sentences using personally relevant words list for >80% intelligibility. Baseline: 50% intelligibility Goal status: MET   7.  Pt will improve expressive language skills by taking 4-6 turns in simple conversation, with supported conversation strategies if needed. Baseline:  Goal status: MET 8.  Pt will generate sentences moderately complex sentences 8-10 words >80% intelligibility, with self-correction/multimodal communication allowed. Baseline: 50% intelligibility Goal status: MET   9.  Patient will use intelligibility strategies and script training for 4-6 turn telephone exchange with family member or friend. Baseline:  Goal status: MET  10.  Pt will  demonstrate operational competence using communication device by independently powering on/off his device, navigating forward/back and selecting icons. Baseline:  Goal status: DEFERRED   11.  Pt will answer simple questions using AAC device to provide information re: emotional state, pain, and preferences. Baseline:  Goal status: DEFERRED   12.  Pt will use AAC in social exchange of 2-4 turns to share and request information.  Baseline:  Goal status: DEFERRED  13.  Pt will achieve 80% intelligibility in 6-8 turn exchange with less familiar listener or unknown context. Baseline: by visit 44 Goal status: INITIAL  14.  Pt will monitor for signs of listener comprehension at sentence level and initiate repair when breakdowns occur in 80% of opportunities. Baseline: by visit 44 Goal status: INITIAL        LONG TERM GOALS: Target date: 04/25/2022    Pt will engage in 5-8 minutes simple-mod complex conversation re: topic of interest with supported conversation, aphasia compensations.  Baseline: 01/25/22: 5 turn exchange. Goal renewed 01/25/22 Goal status: IN PROGRESS   2.  Pt will ID and attempt repair of communication breakdowns >90% of the time in session.    Baseline: 01/25/22: requires occasional mod cues to initiate correction; renewed 01/25/22 Goal status: IN PROGRESS   3.  Pt/family will demonstrate knowledge of community resources and activities to support language/communication. Baseline: 01/25/22: referral completed to [facility]; pt has yet to register for group, renewed 01/25/22 Goal status: IN PROGRESS   4.  Pt will demonstrate use of swallowing precautions independently with s/sx aspiration <5% of trials. Baseline: 01/25/22: no overt s/sx when sipping water in sessions, pt and family report no issues at home with meals Goal status: DEFERRED       ASSESSMENT:   CLINICAL IMPRESSION: Patient continues with aphasia and verbal apraxia, with improvement in aphasia severity (mild)  noted compared with initial evaluation (moderate). 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 Intelligibility is impacted significantly at the sentence level, although pt has improved his intelligibility in structured tasks. He has used some scripts to improve intelligibility for short interactions with family/community, however pt still remains limited in his ability to convey information quickly and outside of highly structured interactions. For this reason, have discussed benefits and potential for using a communication device to supplement and augment pt's verbal communication; pt was initially interested in pursuing this but has opted to continue with focus on verbal communication. I recommend skilled ST to improve pt's language and motor speech skills to improve communication and reduce frustration.    OBJECTIVE IMPAIRMENTS include expressive language, receptive language, aphasia, apraxia, dysarthria, and dysphagia. These impairments are limiting patient from managing medications, managing appointments, managing finances, household responsibilities, ADLs/IADLs, effectively communicating at home and in community, and safety when swallowing. Factors affecting potential to achieve goals and functional outcome are cooperation/participation level; pt appears highly  motivated with good family support. Patient will benefit from skilled SLP services to address above impairments and improve overall function.   REHAB POTENTIAL: Excellent   PLAN: SLP FREQUENCY: 2x/week   SLP DURATION: 12 weeks   PLANNED INTERVENTIONS: Aspiration precaution training, Pharyngeal strengthening exercises, Diet toleration management , Language facilitation, Environmental controls, Trials of upgraded texture/liquids, Cognitive reorganization, Internal/external aids, Functional tasks, Multimodal communication approach, SLP instruction and feedback,  Compensatory strategies, and Patient/family education  [place] [facility], CCC-SLP Speech Language Pathologist [phone]  Shonna Chock, [place] 04/19/2022, 10:00 AM  [facility] at [facility] [place], [place], [address] Phone: [phone]   Fax:  [phone]

## 2022-04-19 NOTE — Therapy (Signed)
Occupational Therapy Treatment Recertification Note MRN: [id number] DOB:[birth year], 77 y.o., male Today's Date: 03/06/2022  PCP: Threasa Alpha, PA REFERRING PROVIDER: Reesa Chew    OT End of Session - 04/19/22 1022     Visit Number 37    Number of Visits 25    Date for OT Re-Evaluation 07/12/2022   Authorization Time Period 01/23/2022    OT Start Time 1015    OT Stop Time 1100    OT Time Calculation (min) 45 min    Activity Tolerance Patient tolerated treatment well    Behavior During Therapy WFL for tasks assessed/performed                    Past Medical History:  Diagnosis Date   Acid reflux    Aortic atherosclerosis ([place])    a. Noted on CT Abd in 2012.   Arthritis    hands   Carotid atherosclerosis    a. 02/2018 U/S: RICA 5-63%, LICA 87-56%; b. 06/3327 MRA Head/Neck: near complete occlusion L ICA and L MCA. Patent RICA.   Diabetes mellitus without complication (HCC)    Diastolic dysfunction    a. 09/2021 Echo: EF 55-60%, no rwma, GrI DD, nl RV fxn, RVSP 14.36mHg.   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 09/2021 MRI/MRA Brain: Acute L MCA distribution infarct. Near complete occlusion L ICA and MCA.   Tachycardia    Tinea pedis of both feet 01/10/2017   Past Surgical History:  Procedure Laterality Date   APPENDECTOMY  1973   CHOLECYSTECTOMY  2013   PROSTATECTOMY  2012   Patient Active Problem List   Diagnosis Date Noted   H/O prostatectomy 02/06/2022   Hemiparesis, aphasia, and dysphagia as late effects of stroke ([facility] 11/07/2021   Lower urinary tract symptoms (LUTS) 11/07/2021   Myalgia due to statin 02/21/2021   Type 2 diabetes mellitus without complication,  without long-term current use of insulin ([facility] 02/21/2021   Aortic atherosclerosis ([facility] 04/27/2020   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   Onychomycosis of multiple toenails with type 2 diabetes mellitus ([facility] 07/19/2016   Carotid stenosis 03/13/2016   Hyperlipidemia associated with type 2 diabetes mellitus ([place]    Hypertension associated with type 2 diabetes mellitus ([facility] [place]    Personal history of prostate cancer    Carotid atherosclerosis    GERD (gastroesophageal reflux disease) 08/20/2013    ONSET DATE: 10/05/21  REFERRING DIAG: L MCA CVA  THERAPY DIAG:  Muscle weakness (generalized)  Other lack of coordination  Rationale for Evaluation and Treatment Rehabilitation  SUBJECTIVE:   SUBJECTIVE STATEMENT:   Pt. Reports that he is planning to return home today for the weekend.     PERTINENT HISTORY: Per chart, [name] a 77year old right-handed male with history of hypertension, hyperlipidemia, diabetes mellitus, prostate cancer/prostatectomy 2012, quit smoking 24 years ago.  [facility] right [facility] weakness/facial droop and expressive aphasia.  Blood pressure 155/102.  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  No associated hemorrhage or significant mass effect.    PAIN:  Are you having pain? 4-6, R shoulder; heat  and rest effective to reduce pain  FALLS: Has patient fallen in last 6 months? Yes. Number of falls 3 since CVA  PLOF: Independent/retired Dealer  PATIENT GOALS : Pt points to his hand (wants to be able to use his hand)  OBJECTIVE:   HAND DOMINANCE: Right   FUNCTIONAL OUTCOME MEASURES: FOTO: 48  UUPPER EXTREMITY ROM      Active ROM Right eval Left Eval WNL Right 12/11/21 Right 01/23/2022 Right 03/15/2022 Right 04/19/2022  Shoulder flexion 90 (135)   95 (110) 95 97 sitting 87 sitting  Shoulder abduction 95 (110)   80 (90) 95 96 sitting 86 sitting  Wrist flex     54 60 62 62  Wrist  ext     41 45 48 50    UPPER EXTREMITY MMT:      MMT Right eval Left Eval 5/5 Right 12/11/21 Right 03/15/22 Right 04/19/2022  Shoulder flexion 3-   -3 3- 3-  Shoulder abduction 3-   -3 3- 3-  Shoulder adduction          Shoulder extension          Shoulder internal rotation 3-        Shoulder external rotation 3-        Middle trapezius          Lower trapezius          Elbow flexion 4-   4 4+ 5  Elbow extension 4+   '5 5 5  '$ Wrist flexion 4   NT d/t pain 4 4+  Wrist extension 4-   NT d/t pain 4 4+  (Blank rows = not tested)   HAND FUNCTION: Eval:         Grip strength: Right: 14 lbs; Left: 58 lbs, Lateral pinch: Right: 7 lbs, Left: 19 lbs, and 3 point pinch: Right: 2 lbs, Left: 16 lbs 10th visit: Grip strength: Right: 16  lbs;                                Lateral pinch: Right: 9  lbs;                            3 point pinch: Right: 5 lbs 20th visit: Grip strength: Right: 20 lbs; Left: 70 lbs, Lateral pinch: Right: 10 lbs, Left: 20 lbs, and 3 point pinch: Right: 9 lbs, Left: 20 lbs  30th visit: Grip strength: Right: 18 lbs; Left: 70 lbs, Lateral pinch: Right: 12 lbs, Left: 20 lbs, and 3 point pinch: Right: 8 lbs, Left: 20 lbs  07/29/3974 Recert: Grip strength: Right: 20 lbs; Left: 70 lbs, Lateral pinch: Right: 10 lbs, Left: 20 lbs, and 3 point pinch: Right: 8 lbs, Left: 20 lbs    Eval: COORDINATION: Finger Nose Finger test: difficult/lacks precision with reaching targets 9 Hole Peg test: Right: unable sec; Left: 27 sec Able to oppose 2nd and 3rd digits to thumb on R hand   10th visit 9 hole Peg test: Right: 11 min, 44 sec   20th visit:  9 hole Peg test: Right: 3 min 6 sec   30th visit:  9 hole Peg test: Right: 1 min 45 sec   7/34/1937 Recert:  9 hole Peg test: Right: 1 min 30 sec     TODAY'S TREATMENT:   Therapeutic Exercise:   Pt. worked on pinch strengthening in the left hand  for lateral, and 3pt. pinch using yellow, red, green, and blue resistive clips. Pt. worked on  placing the clips at a horizontal angle. Pt. worked on translatory movements moving the clips from one pinch position to another. Tactile and verbal cues were required for eliciting the desired movement.    PATIENT EDUCATION: Education details:  compensatory strategies for accessing soup  Person educated: pt/daughter Education method: Merchandiser, retail cues Education comprehension: verbalized understanding  HOME EXERCISE PROGRAM: Theraputty, hand writing skills, table slides  GOALS: Goals reviewed with patient? Yes   SHORT TERM GOALS: Target date 05/31/2022       Pt will be indep to perform HEP for increasing strength and coordination throughout RUE.  Baseline: Not yet initiated; 10th: putty given but pt reports limited use, instructed in table slides and self PROM for R wrist/digits. 20th:  Pt performing exercises but requires continual upgrades and instruction 30th: Pt performing exercises but requires continual upgrades, modifications, and instruction as needed 04/19/2022: Independent with HEPs. Will continue to upgrade as indicated. Goal status: ongoing   2.  Pt will manage clothing fasteners with extra time, using RUE as an assist  Baseline: unable; pt has elastic laces and wears elastic waisted pants; 10th: pt uses R hand as an assist to zip and button jeans with extra time; not yet tried small buttons on a shirt.  Pt continues to use elastic laces on shoes. 20th:  Improving with manipulation skills but still using elastic laces, able to button pants and shirt with increased time and effort. 30th: Pt. Is able to fasten pant fasteners, and button pants efficiently, ans independently. Goal status: met   LONG TERM GOALS: Target date:07/12/2022   Pt will increase FOTO score to 55 or better to indicate improved performance with daily tasks.  Baseline: 48; 10th: 48; 20th: score:42 30th visit: 64 Goal status: Achieved   2.  Pt will increase R grip strength by 10 or more lbs to  enable pt to hold and carry light ADL supplies without dropping.  Baseline: R grip 14#, L 58#; 10th visit: R grip 16; 20th visit: 20# 30th: 18#, Pt. Continues to have difficulty holding supplies securely. 04/19/2022: R: 20# Goal status: ongoing   3.  Pt will increase RUE strength to be able to engage RUE into ADLs at least 50% of the time. Baseline: Pt using L non-dominant arm to manage ADLs; 10th visit: Son reports he constantly sees pt attempt to use his R arm for daily tasks, but continues to be limited and still must use LUE for most tasks (see chart for RUE MMT) 20th:  continues to improve on strength and is working on consistency of use. 30th visit: Pt. Continues to use the right hand during daily ADL, and IADL tasks. 04/19/2022: RUE strength continues to be limited. Pt. Is in the process of having his persistent arm pain assessed by an orthopedic physician. Pt. Is consistently engaging his right hand during ADLs more than  50% of the time.  Goal status: Partially met. Goal deferred, and new goal added for Right shoulder ROM    4.  Pt will complete 9 hole peg test on the R in 1 min or less to work towards ability to use R hand to pick up small ADL supplies.  Baseline: Pt can remove a peg but can not pick one up; 10th visit: R 9 hole completion 11 min 44 sec; 20th visit:   Pt improved to 3 mins and 6 secs, nearing goal.  30 visit: 1 min. & 45 sec. 04/19/2022: 1 min. & 30 sec. Goal status: Ongoing   5.  Pt will improve right pinch  strength to be able to open a tab on a can of soup with modified independence.  Baseline: Right lateral pinch 10#, 3pt. Pinch 8#. Pt. is unable to open, and pull the tab on a can of soup. Goal status: New  6. Pt. Will increase right shoulder ROM by 10 degrees to improve functional reach during ADLs, and IADL. Baseline: Right shoulder AROM flexion in sitting; 87, Abduction: 86 Goal status: New  ASSESSMENT:  CLINICAL IMPRESSION:  Measurements were obtained, and  goals were reviewed with then Pt. Pt. has made progress with right hand function, right grip strength, and [facility] skills. Pt. continues to present with limited left shoulder ROM with flexion, and abduction secondary to pain. Pt. Is currently being assessed by an orthopedic physician, and imaging is pending. A new goal was added to the POC to improve shoulder ROM in preparation for functional reaching during ADLs/IADLs. A new goal was also added for improving right hand pinch strength to be able to open soup can tabs, as Pt. would like to be able to independently prepare a bowl of soup when at home alone. Pt. continues to work on increasing strength and coordination in the right hand to improve overall RUE functioning for increased independence with ADLs, and IADL tasks.    PERFORMANCE DEFICITS in functional skills including ADLs, IADLs, coordination, dexterity, ROM, strength, pain, flexibility, FMC, GMC, mobility, balance, continence, decreased knowledge of use of DME, and UE functional use, cognitive skills including safety awareness, and psychosocial skills including.   IMPAIRMENTS are limiting patient from ADLs, IADLs, leisure, and social participation.   COMORBIDITIES may have co-morbidities  that affects occupational performance. Patient will benefit from skilled OT to address above impairments and improve overall function.  MODIFICATION OR ASSISTANCE TO COMPLETE EVALUATION: Min-Moderate modification of tasks or assist with assess necessary to complete an evaluation.  OT OCCUPATIONAL PROFILE AND HISTORY: Problem focused assessment: Including review of records relating to presenting problem.  CLINICAL DECISION MAKING: Moderate - several treatment options, min-mod task modification necessary  REHAB POTENTIAL: Good  EVALUATION COMPLEXITY: Moderate    PLAN: OT FREQUENCY: 2x/week  OT DURATION: 12 weeks  PLANNED INTERVENTIONS: self care/ADL training, therapeutic exercise, therapeutic activity,  neuromuscular re-education, manual therapy, passive range of motion, balance training, functional mobility training, moist heat, cryotherapy, patient/family education, cognitive remediation/compensation, energy conservation, coping strategies training, and DME and/or AE instructions  RECOMMENDED OTHER SERVICES: N/A  CONSULTED AND AGREED WITH PLAN OF CARE: Patient and family member/caregiver  PLAN FOR NEXT SESSION: HEP progression, neuro re-ed, therapeutic exercises   Harrel Carina, MS, OTR/L

## 2022-04-24 ENCOUNTER — Ambulatory Visit: Payer: Medicare Other

## 2022-04-24 ENCOUNTER — Encounter: Payer: Self-pay | Admitting: Speech Pathology

## 2022-04-24 ENCOUNTER — Ambulatory Visit: Payer: Medicare Other | Admitting: Speech Pathology

## 2022-04-24 ENCOUNTER — Ambulatory Visit: Payer: Medicare Other | Admitting: Occupational Therapy

## 2022-04-24 DIAGNOSIS — M6281 Muscle weakness (generalized): Secondary | ICD-10-CM

## 2022-04-24 DIAGNOSIS — R482 Apraxia: Secondary | ICD-10-CM | POA: Diagnosis not present

## 2022-04-24 DIAGNOSIS — R4701 Aphasia: Secondary | ICD-10-CM

## 2022-04-24 NOTE — Therapy (Signed)
Occupational Therapy Treatment Note MRN: [id number] DOB:1945/07/21, 77 y.o., male Today's Date: 03/06/2022  PCP: Threasa Alpha, PA REFERRING PROVIDER: Reesa Chew    OT End of Session - 04/24/22 1337     Visit Number 38    Number of Visits 57    Date for OT Re-Evaluation 07/12/22    Authorization Time Period 01/23/2022    OT Start Time 1000    OT Stop Time 1045    OT Time Calculation (min) 45 min    Activity Tolerance Patient tolerated treatment well    Behavior During Therapy WFL for tasks assessed/performed                        Past Medical History:  Diagnosis Date   Acid reflux    Aortic atherosclerosis ([place])    a. Noted on CT Abd in 2012.   Arthritis    hands   Carotid atherosclerosis    a. 02/2018 U/S: RICA 8-41%, LICA 66-06%; b. 05/158 MRA Head/Neck: near complete occlusion L ICA and L MCA. Patent RICA.   Diabetes mellitus without complication (HCC)    Diastolic dysfunction    a. 09/2021 Echo: EF 55-60%, no rwma, GrI DD, nl RV fxn, RVSP 14.67mHg.   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 09/2021 MRI/MRA Brain: Acute L MCA distribution infarct. Near complete occlusion L ICA and MCA.   Tachycardia    Tinea pedis of both feet 01/10/2017   Past Surgical History:  Procedure Laterality Date   APPENDECTOMY  1973   CHOLECYSTECTOMY  2013   PROSTATECTOMY  2012   Patient Active Problem List   Diagnosis Date Noted   H/O prostatectomy 02/06/2022   Hemiparesis, aphasia, and dysphagia as late effects of stroke ([place] 11/07/2021   Lower urinary tract symptoms (LUTS) 11/07/2021   Myalgia due to statin 02/21/2021   Type 2 diabetes mellitus without complication, without  long-term current use of insulin ([facility] 02/21/2021   Aortic atherosclerosis ([facility] 04/27/2020   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   Onychomycosis of multiple toenails with type 2 diabetes mellitus ([facility] [place] 07/19/2016   Carotid stenosis 03/13/2016   Hyperlipidemia associated with type 2 diabetes mellitus (HC[place]    Hypertension associated with type 2 diabetes mellitus (HF[place]    Personal history of prostate cancer    Carotid atherosclerosis    GERD (gastroesophageal reflux disease) 08/20/2013    ONSET DATE: 10/05/21  REFERRING DIAG: L MCA CVA  THERAPY DIAG:  Muscle weakness (generalized)  Other lack of coordination  Rationale for Evaluation and Treatment Rehabilitation  SUBJECTIVE:   SUBJECTIVE STATEMENT:   Pt.  reports that he got tired being home.   PERTINENT HISTORY: Per chart, WKowen Kluthis a 77year old right-handed male with history of hypertension, hyperlipidemia, diabetes mellitus, prostate cancer/prostatectomy 2012, quit smoking 24 years ago.  [facility] weakness/facial droop and expressive aphasia.  Blood pressure 155/102.  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  No associated hemorrhage or significant mass effect.    PAIN:  Are you having pain? No Reports of pain today  FALLS: Has patient fallen in last 6 months? Yes. Number of falls 3 since CVA  PLOF: Independent/retired Dealer  PATIENT GOALS : Pt points to his hand (wants to be able to use his hand)  OBJECTIVE:   HAND DOMINANCE: Right   FUNCTIONAL OUTCOME MEASURES: FOTO: 48  UUPPER EXTREMITY ROM      Active ROM Right eval Left Eval WNL Right 12/11/21 Right 01/23/2022 Right 03/15/2022 Right 04/19/2022  Shoulder flexion 90 (135)   95 (110) 95 97 sitting 87 sitting  Shoulder abduction 95 (110)   80 (90) 95 96 sitting 86 sitting  Wrist flex     54 60 62 62  Wrist ext     41 45 48 50    UPPER EXTREMITY MMT:      MMT Right eval  Left Eval 5/5 Right 12/11/21 Right 03/15/22 Right 04/19/2022  Shoulder flexion 3-   -3 3- 3-  Shoulder abduction 3-   -3 3- 3-  Shoulder adduction          Shoulder extension          Shoulder internal rotation 3-        Shoulder external rotation 3-        Middle trapezius          Lower trapezius          Elbow flexion 4-   4 4+ 5  Elbow extension 4+   '5 5 5  '$ Wrist flexion 4   NT d/t pain 4 4+  Wrist extension 4-   NT d/t pain 4 4+  (Blank rows = not tested)   HAND FUNCTION: Eval:         Grip strength: Right: 14 lbs; Left: 58 lbs, Lateral pinch: Right: 7 lbs, Left: 19 lbs, and 3 point pinch: Right: 2 lbs, Left: 16 lbs 10th visit: Grip strength: Right: 16  lbs;                                Lateral pinch: Right: 9  lbs;                            3 point pinch: Right: 5 lbs 20th visit: Grip strength: Right: 20 lbs; Left: 70 lbs, Lateral pinch: Right: 10 lbs, Left: 20 lbs, and 3 point pinch: Right: 9 lbs, Left: 20 lbs  30th visit: Grip strength: Right: 18 lbs; Left: 70 lbs, Lateral pinch: Right: 12 lbs, Left: 20 lbs, and 3 point pinch: Right: 8 lbs, Left: 20 lbs  5/36/6440 Recert: Grip strength: Right: 20 lbs; Left: 70 lbs, Lateral pinch: Right: 10 lbs, Left: 20 lbs, and 3 point pinch: Right: 8 lbs, Left: 20 lbs    Eval: COORDINATION: Finger Nose Finger test: difficult/lacks precision with reaching targets 9 Hole Peg test: Right: unable sec; Left: 27 sec Able to oppose 2nd and 3rd digits to thumb on R hand   10th visit 9 hole Peg test: Right: 11 min, 44 sec   20th visit:  9 hole Peg test: Right: 3 min 6 sec   30th visit:  9 hole Peg test: Right: 1 min 45 sec   3/47/4259 Recert:  9 hole Peg test: Right: 1 min 30 sec     TODAY'S TREATMENT:   Self-care:  Pt. 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 Pt. worked on Engineer, manufacturing of flowers. Pt. worked on tracing the letters, then copying the letters using large lined paper. Pt. then worked on [employer] with emphasis placed on incorporating, and engaging the right hand .    PATIENT EDUCATION: Education details:  compensatory strategies for accessing soup  Person educated: pt/daughter Education method: Merchandiser, retail cues Education comprehension: verbalized understanding  HOME EXERCISE PROGRAM: Theraputty, hand writing skills, table slides  GOALS: Goals reviewed with patient? Yes   SHORT TERM GOALS: Target date 05/31/2022       Pt will be indep to perform HEP for increasing strength and coordination throughout RUE.  Baseline: Not yet initiated; 10th: putty given but pt reports limited use, instructed in table slides and self PROM for R wrist/digits. 20th:  Pt performing exercises but requires continual upgrades and instruction 30th: Pt performing exercises but requires continual upgrades, modifications, and instruction as needed 04/19/2022: Independent with HEPs. Will continue to upgrade as indicated. Goal status: ongoing   2.  Pt will manage clothing fasteners with extra time, using RUE as an assist  Baseline: unable; pt has elastic laces and wears elastic waisted pants; 10th: pt uses R hand as an assist to zip and button jeans with extra time; not yet tried small buttons on a shirt.  Pt continues to use elastic laces on shoes. 20th:  Improving with manipulation skills but still using elastic laces, able to button pants and shirt with increased time and effort. 30th: Pt. Is able to fasten pant fasteners, and button pants efficiently, ans independently. Goal status: met   LONG TERM GOALS: Target date:07/12/2022   Pt will increase FOTO score to 55 or better to indicate improved performance with daily tasks.  Baseline: 48; 10th: 48; 20th: score:42 30th visit: 64 Goal status: Achieved   2.  Pt will increase R grip strength by 10 or more lbs to  enable pt to hold and carry light ADL supplies without dropping.  Baseline: R grip 14#, L 58#; 10th visit: R grip 16; 20th visit: 20# 30th: 18#, Pt. Continues to have difficulty holding supplies securely. 04/19/2022: R: 20# Goal status: ongoing   3.  Pt will increase RUE strength to be able to engage RUE into ADLs at least 50% of the time. Baseline: Pt using L non-dominant arm to manage ADLs; 10th visit: Son reports he constantly sees pt attempt to use his R arm for daily tasks, but continues to be limited and still must use LUE for most tasks (see chart for RUE MMT) 20th:  continues to improve on strength and is working on consistency of use. 30th visit: Pt. Continues to use the right hand during daily ADL, and IADL tasks. 04/19/2022: RUE strength continues to be limited. Pt. Is in the process of having his persistent arm pain assessed by an orthopedic physician. Pt. Is consistently engaging his right hand during ADLs more than  50% of the time.  Goal status: Partially met. Goal deferred, and new goal added for Right shoulder ROM    4.  Pt will complete 9 hole peg test on the R in 1 min or less to work towards ability to use R hand to pick up small ADL supplies.  Baseline: Pt can remove a peg but can not pick one up; 10th visit: R 9 hole completion 11 min 44 sec; 20th visit:   Pt improved to 3 mins and 6 secs,  nearing goal. 30 visit: 1 min. & 45 sec. 04/19/2022: 1 min. & 30 sec. Goal status: Ongoing   5.  Pt will improve right pinch  strength to be able to open a tab on a can of soup with modified independence.  Baseline: Right lateral pinch 10#, 3pt. Pinch 8#. Pt. is unable to open, and pull the tab on a can of soup. Goal status: New  6. Pt. will increase right shoulder ROM by 10 degrees to improve functional reach during ADLs, and IADL. Baseline: Right shoulder AROM flexion in sitting; 87, Abduction: 86 Goal status: New  ASSESSMENT:  CLINICAL IMPRESSION:  Pt. reports having an MRI of his  right shoulder scheduled for this afternoon. Pt. presents with micrographia, however with visual cues, verbal cues, and prompts Pt. was able to formulated larger letters temporarily. Pt. requires continued work on Archivist. Pt. required increased cues to engage his right hand when typing words, and requires increased time to complete. Pt. continues to work on increasing strength and coordination in the right hand to improve overall RUE functioning for increased independence with ADLs, and IADL tasks.   PERFORMANCE DEFICITS in functional skills including ADLs, IADLs, coordination, dexterity, ROM, strength, pain, flexibility, FMC, GMC, mobility, balance, continence, decreased knowledge of use of DME, and UE functional use, cognitive skills including safety awareness, and psychosocial skills including.   IMPAIRMENTS are limiting patient from ADLs, IADLs, leisure, and social participation.   COMORBIDITIES may have co-morbidities  that affects occupational performance. Patient will benefit from skilled OT to address above impairments and improve overall function.  MODIFICATION OR ASSISTANCE TO COMPLETE EVALUATION: Min-Moderate modification of tasks or assist with assess necessary to complete an evaluation.  OT OCCUPATIONAL PROFILE AND HISTORY: Problem focused assessment: Including review of records relating to presenting problem.  CLINICAL DECISION MAKING: Moderate - several treatment options, min-mod task modification necessary  REHAB POTENTIAL: Good  EVALUATION COMPLEXITY: Moderate    PLAN: OT FREQUENCY: 2x/week  OT DURATION: 12 weeks  PLANNED INTERVENTIONS: self care/ADL training, therapeutic exercise, therapeutic activity, neuromuscular re-education, manual therapy, passive range of motion, balance training, functional mobility training, moist heat, cryotherapy, patient/family education, cognitive remediation/compensation, energy conservation, coping strategies training,  and DME and/or AE instructions  RECOMMENDED OTHER SERVICES: N/A  CONSULTED AND AGREED WITH PLAN OF CARE: Patient and family member/caregiver  PLAN FOR NEXT SESSION: HEP progression, neuro re-ed, therapeutic exercises   Harrel Carina, MS, OTR/L

## 2022-04-24 NOTE — Therapy (Unsigned)
OUTPATIENT SPEECH LANGUAGE PATHOLOGY TREATMENT NOTE   Patient Name: Gerald Bauer MRN: [id number] DOB:Jul 27, 1945, 77 y.o., male Today's Date: 04/24/2022  PCP: [name], PA-C Referring Provider:  [name], PA-C  END OF SESSION:   End of Session - 04/24/22 1009     Visit Number [id number]    Number of Visits 45    Date for SLP Re-Evaluation 04/25/22    SLP Start Time 0900    SLP Stop Time  1000    SLP Time Calculation (min) 60 min    Activity Tolerance Patient tolerated treatment well             Past Medical History:  Diagnosis Date   Acid reflux    Aortic atherosclerosis ([place])    a. Noted on CT Abd in 2012.   Arthritis    hands   Carotid atherosclerosis    a. 02/2018 U/S: RICA 4-09%, LICA 81-19%; b. 03/4780 MRA Head/Neck: near complete occlusion L ICA and L MCA. Patent RICA.   Diabetes mellitus without complication (HCC)    Diastolic dysfunction    a. 09/2021 Echo: EF 55-60%, no rwma, GrI DD, nl RV fxn, RVSP 14.37mHg.   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 09/2021 MRI/MRA Brain: Acute L MCA distribution infarct. Near complete occlusion L ICA and MCA.   Tachycardia    Tinea pedis of both feet 01/10/2017   Past Surgical History:  Procedure Laterality Date   APPENDECTOMY  1973   CHOLECYSTECTOMY  2013   PROSTATECTOMY  2012   Patient Active Problem List   Diagnosis Date Noted   H/O prostatectomy 02/06/2022   Hemiparesis, aphasia, and dysphagia as late effects of stroke ([place] 11/07/2021   Lower urinary tract symptoms (LUTS) 11/07/2021   Myalgia due to statin 02/21/2021   Type 2 diabetes mellitus without complication, without long-term current use of insulin ([place]  02/21/2021   Aortic atherosclerosis ([facility] 04/27/2020   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   Onychomycosis of multiple toenails with type 2 diabetes mellitus ([facility] 07/19/2016   Carotid stenosis 03/13/2016   Hyperlipidemia associated with type 2 diabetes mellitus ([facility]    Hypertension associated with type 2 diabetes mellitus ([facility]    Personal history of prostate cancer    Carotid atherosclerosis    GERD (gastroesophageal reflux disease) 08/20/2013    ONSET DATE: 10/05/2021    REFERRING DIAG: Cerebral infarction due to unspecified occlusion of left middle cerebral artery   THERAPY DIAG:  Verbal apraxia Verbal apraxia   Rationale for Evaluation and Treatment Rehabilitation   SUBJECTIVE STATEMENT:           Pt reports an increase in using tapping stick outside of speech therapy sessions.  Pt accompanied by: daughter-in-law   PERTINENT HISTORY: Pt  is a 89year old male with history of left-sided carotid stenosis, non-insulin-dependent diabetes mellitus type 2, PAD, GERD, mild COPD, history of tobacco use, hyperlipidemia, hypertension, [facility], expressive aphasia, and R sided weakness and found to have left MCA CVA. [facility]. Advanced to dysphagia 3/thin liquids by cup by time of d/c from CIR. 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 Difficult to fully assess cognition given severity of linguistic impairment; however, does present with decreased safety awareness, diminished problem-solving, emergent awareness, and mild impulsivity with movement."     DIAGNOSTIC FINDINGS: MBS 10/19/21: "Pt presents with overall mild oropharyngeal dysphagia. Oral phase c/b piecemeal deglutition, decreased mastication, decreased bolus cohesion, premature spillage, and weak lingual manipulation. Pharyngeal phase c/b reduced pharyngeal peristalsis +  reduced BOT approximation to PPW, which results in mild to moderate vallecular and pyriform sinuses residuals (vallecula > pyriform sinuses for solids, pyriform sinuses > vallecula for liquids). No penetration nor aspiration appreciated with thin liquid via cup, Dysphagia 1 (puree), Dysphagia 2 (chopped), Dysphagia 3 (mechanical soft), or with 13 mm Barium tablet placed in puree. Once instance of sensed aspiration with intake of large bolus of thin liquid via straw due to swallow initiation delay to pyriform sinuses and mistiming of swallow-breath cycle; despite strong reflexive cough aspirant was not fully cleared from trachea. Pt's level of swallow initiation was noted to vary with liquid consumption (over base of epiglottis and at pyriform sinuses) with level of swallow initiation for solids to be consistent at the vallecula. Pharyngeal stasis was partially cleared with reflexive secondary swallow." MRI brain 10/05/21: 1. 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 No associated hemorrhage or significant regional mass effect. 2. Loss of normal flow voids throughout the left ICA and MCA, consistent with slow flow and/or occlusion. 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 3. Underlying mild chronic microvascular ischemic disease. Tiny remote left ACA distribution infarct.   PAIN:  Are you having pain? Yes, 5/10, right shoulder   PATIENT GOALS talk better   OBJECTIVE:    TODAY'S TREATMENT:  Facilitated simple-mod complex verbal expression with less familiar listener (graduate clinician) re: family, occupation, hobbies, stroke story to target intelligibility and use of strategies. Probation officer to provide feedback and generate pt awareness of communication breakdowns. Pt able to identify communication breakdowns and initiated correction with min-mod cues. Patient used tapping stick to slow his rate of speech  and pause between syllables and/or words. Min cues were used to remind patient to use tapping stick, while moderate cues were needed to slow down, repeat sentences, or use multiple taps for multisyllabic words. Graduate student understood speech productions at the conversation level with 70-80% accuracy. Patient's daughter-in-law reported not being informed on certain appointments and patient ignoring important healthcare related calls/texts. Patient did report some trouble reading and understanding longer texts messages. Clinician introduced text-to-speech as an aid to comprehending text messages and patient practiced using it on a few other text messages. Clinician recommended reading aloud at home to practice intelligible speech, and joining the [employer] group sessions for support and practice with speech productions.   PATIENT EDUCATION: Education details: using strategies for intelligible speech  Person educated: Patient  Education method: Explanation, Demonstration, and written supports Education comprehension: verbalized understanding and needs further education     GOALS: Goals reviewed with patient? Yes   SHORT TERM GOALS: Target date: 10 sessions   Patient will participate in clinical assessment of swallow function with goals added as needed. Baseline: Goal status: MET   2.  Pt will communicate emergency information 100% accuracy using visual aid if necessary.  Baseline:  Goal status: MET   3.  Pt will approximate personally relevant words and phrases >80% accuracy  using script training and min visual cues for apraxia.   Baseline:  Goal status: MET   4.  Pt will generate at least 4 descriptors of target word 80% of the time using semantic feature analysis to improve abilities in wordfinding and resolving communication breakdowns.  Baseline:  Goal status: MET   5.  Pt will complete HEP for dysphagia with rare min A. Baseline:  Goal status: MET   6.  Pt will  generate sentences using personally relevant words list for >80% intelligibility. Baseline: 50% intelligibility Goal status: MET   7.  Pt will improve expressive language skills by taking 4-6 turns in simple conversation, with supported conversation strategies if needed. Baseline:  Goal status: MET 8.  Pt will generate sentences moderately complex sentences 8-10 words >80% intelligibility, with self-correction/multimodal communication allowed. Baseline: 50% intelligibility Goal status: MET   9.  Patient will use intelligibility strategies and script training for 4-6 turn telephone exchange with family member or friend. Baseline:  Goal status: MET  10.  Pt will demonstrate operational competence using communication device by independently powering on/off his device, navigating forward/back and selecting icons. Baseline:  Goal status: DEFERRED   11.  Pt will answer simple questions using AAC device to provide information re: emotional state, pain, and preferences. Baseline:  Goal status: DEFERRED   12.  Pt will use AAC in social exchange of 2-4 turns to share and request information.  Baseline:  Goal status: DEFERRED  13.  Pt will achieve 80% intelligibility in 6-8 turn exchange with less familiar listener or unknown context. Baseline: by visit 44 Goal status: INITIAL  14.  Pt will monitor for signs of listener comprehension at sentence level and initiate repair when breakdowns occur in 80% of opportunities. Baseline: by visit 44 Goal status: INITIAL        LONG TERM GOALS: Target date: 04/25/2022    Pt will engage in 5-8 minutes simple-mod complex conversation re: topic of interest with supported conversation, aphasia compensations.  Baseline: 01/25/22: 5 turn exchange. Goal renewed 01/25/22 Goal status: IN PROGRESS   2.  Pt will ID and attempt repair of communication breakdowns >90% of the time in session.    Baseline: 01/25/22: requires occasional mod cues to initiate  correction; renewed 01/25/22 Goal status: IN PROGRESS   3.  Pt/family will demonstrate knowledge of community resources and activities to support language/communication. Baseline: 01/25/22: referral completed to [facility]; pt has yet to register for group, renewed 01/25/22 Goal status: IN PROGRESS   4.  Pt will demonstrate use of swallowing precautions independently with s/sx aspiration <5% of trials. Baseline: 01/25/22: no overt s/sx when sipping water in sessions, pt and family report no issues at home with meals Goal status: DEFERRED       ASSESSMENT:   CLINICAL IMPRESSION: Patient continues with aphasia and verbal apraxia, with improvement in aphasia severity (mild) noted compared with initial evaluation (moderate). 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 Intelligibility is impacted significantly at the sentence level, but has improved with the usage of a tapping stick which helps him focus on slowing down his rate of speech. He has used some scripts to improve intelligibility for short interactions with family/community, however pt still remains limited in his ability to convey information quickly and outside of highly structured interactions. For this reason, have discussed benefits and potential for using a communication device to supplement and augment pt's verbal communication; pt was initially interested in  pursuing this but has opted to continue with focus on verbal communication. I recommend skilled ST to improve pt's language and motor speech skills to improve communication and reduce frustration.    OBJECTIVE IMPAIRMENTS include expressive language, receptive language, aphasia, apraxia, dysarthria, and dysphagia. These impairments are limiting patient from managing medications, managing appointments, managing finances, household responsibilities, ADLs/IADLs, effectively communicating at home and in community, and  safety when swallowing. Factors affecting potential to achieve goals and functional outcome are cooperation/participation level; pt appears highly motivated with good family support. Patient will benefit from skilled SLP services to address above impairments and improve overall function.   REHAB POTENTIAL: Excellent   PLAN: SLP FREQUENCY: 2x/week   SLP DURATION: 12 weeks   PLANNED INTERVENTIONS: Aspiration precaution training, Pharyngeal strengthening exercises, Diet toleration management , Language facilitation, Environmental controls, Trials of upgraded texture/liquids, Cognitive reorganization, Internal/external aids, Functional tasks, Multimodal communication approach, SLP instruction and feedback, Compensatory strategies, and Patient/family education  [name], MS, Merchandiser, retail Pathologist [phone]   Kenzel Ruesch, [place] 04/24/2022, 10:12 AM  [place] [facility] [place], [place], [address] Phone: [phone]   Fax:  [phone]

## 2022-04-25 ENCOUNTER — Other Ambulatory Visit: Payer: Self-pay | Admitting: Family Medicine

## 2022-04-25 DIAGNOSIS — E1159 Type 2 diabetes mellitus with other circulatory complications: Secondary | ICD-10-CM

## 2022-04-26 ENCOUNTER — Ambulatory Visit: Payer: Medicare Other | Admitting: Occupational Therapy

## 2022-04-26 ENCOUNTER — Ambulatory Visit: Payer: Medicare Other

## 2022-04-26 ENCOUNTER — Encounter: Payer: Self-pay | Admitting: Speech Pathology

## 2022-04-26 ENCOUNTER — Ambulatory Visit: Payer: Medicare Other | Attending: Physical Medicine and Rehabilitation | Admitting: Speech Pathology

## 2022-04-26 DIAGNOSIS — G8929 Other chronic pain: Secondary | ICD-10-CM | POA: Insufficient documentation

## 2022-04-26 DIAGNOSIS — M6281 Muscle weakness (generalized): Secondary | ICD-10-CM | POA: Diagnosis present

## 2022-04-26 DIAGNOSIS — R278 Other lack of coordination: Secondary | ICD-10-CM | POA: Insufficient documentation

## 2022-04-26 DIAGNOSIS — I63512 Cerebral infarction due to unspecified occlusion or stenosis of left middle cerebral artery: Secondary | ICD-10-CM | POA: Insufficient documentation

## 2022-04-26 DIAGNOSIS — R4701 Aphasia: Secondary | ICD-10-CM | POA: Diagnosis present

## 2022-04-26 DIAGNOSIS — R482 Apraxia: Secondary | ICD-10-CM | POA: Diagnosis present

## 2022-04-26 DIAGNOSIS — M25511 Pain in right shoulder: Secondary | ICD-10-CM | POA: Diagnosis present

## 2022-04-26 NOTE — Therapy (Signed)
Occupational Therapy Treatment Note MRN: [id number] DOB:[birth year], 77 y.o., male Today's Date: 03/06/2022  PCP: Threasa Alpha, PA REFERRING PROVIDER: Reesa Chew    OT End of Session - 04/26/22 2253     Visit Number 39    Number of Visits 29    Date for OT Re-Evaluation 07/12/22    OT Start Time 1015    OT Stop Time 1100    OT Time Calculation (min) 45 min    Activity Tolerance Patient tolerated treatment well    Behavior During Therapy WFL for tasks assessed/performed                        Past Medical History:  Diagnosis Date   Acid reflux    Aortic atherosclerosis ([place])    a. Noted on CT Abd in 2012.   Arthritis    hands   Carotid atherosclerosis    a. 02/2018 U/S: RICA 7-61%, LICA 95-09%; b. 05/2669 MRA Head/Neck: near complete occlusion L ICA and L MCA. Patent RICA.   Diabetes mellitus without complication (HCC)    Diastolic dysfunction    a. 09/2021 Echo: EF 55-60%, no rwma, GrI DD, nl RV fxn, RVSP 14.48mHg.   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 09/2021 MRI/MRA Brain: Acute L MCA distribution infarct. Near complete occlusion L ICA and MCA.   Tachycardia    Tinea pedis of both feet 01/10/2017   Past Surgical History:  Procedure Laterality Date   APPENDECTOMY  1973   CHOLECYSTECTOMY  2013   PROSTATECTOMY  2012   Patient Active Problem List   Diagnosis Date Noted   H/O prostatectomy 02/06/2022   Hemiparesis, aphasia, and dysphagia as late effects of stroke ([place] 11/07/2021   Lower urinary tract symptoms (LUTS) 11/07/2021   Myalgia due to statin 02/21/2021   Type 2 diabetes mellitus without complication, without long-term current use of insulin ([facility]  02/21/2021   Aortic atherosclerosis ([facility] 04/27/2020   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   Onychomycosis of multiple toenails with type 2 diabetes mellitus ([facility] 07/19/2016   Carotid stenosis 03/13/2016   Hyperlipidemia associated with type 2 diabetes mellitus ([facility] [place]    [place]pertension associated with type 2 diabetes mellitus [place] [place]    Personal history of prostate cancer    Carotid atherosclerosis    GERD (gastroesophageal reflux disease) 08/20/2013    ONSET DATE: 10/05/21  REFERRING DIAG: L MCA CVA  THERAPY DIAG:  Muscle weakness (generalized)  Other lack of coordination  Rationale for Evaluation and Treatment Rehabilitation  SUBJECTIVE:   SUBJECTIVE STATEMENT:   Pt.  reports that he got tired being home.   PERTINENT HISTORY: Per chart, Gerald Tejadais a 77year old right-handed male with history of hypertension, hyperlipidemia, diabetes mellitus, prostate cancer/prostatectomy 2012, quit smoking 24 years ago.  [facility] right side weakness/facial droop and expressive aphasia.  Blood pressure 155/102.  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  No associated hemorrhage or significant mass effect.    PAIN:  Are you having pain? No Reports of pain today  FALLS: Has patient fallen in last 6  months? Yes. Number of falls 3 since CVA  PLOF: Independent/retired Dealer  PATIENT GOALS : Pt points to his hand (wants to be able to use his hand)  OBJECTIVE:   HAND DOMINANCE: Right   FUNCTIONAL OUTCOME MEASURES: FOTO: 48  UUPPER EXTREMITY ROM      Active ROM Right eval Left Eval WNL Right 12/11/21 Right 01/23/2022 Right 03/15/2022 Right 04/19/2022  Shoulder flexion 90 (135)   95 (110) 95 97 sitting 87 sitting  Shoulder abduction 95 (110)   80 (90) 95 96 sitting 86 sitting  Wrist flex     54 60 62 62  Wrist ext     41 45 48 50    UPPER EXTREMITY MMT:      MMT Right eval Left Eval 5/5 Right 12/11/21  Right 03/15/22 Right 04/19/2022  Shoulder flexion 3-   -3 3- 3-  Shoulder abduction 3-   -3 3- 3-  Shoulder adduction          Shoulder extension          Shoulder internal rotation 3-        Shoulder external rotation 3-        Middle trapezius          Lower trapezius          Elbow flexion 4-   4 4+ 5  Elbow extension 4+   '5 5 5  '$ Wrist flexion 4   NT d/t pain 4 4+  Wrist extension 4-   NT d/t pain 4 4+  (Blank rows = not tested)   HAND FUNCTION: Eval:         Grip strength: Right: 14 lbs; Left: 58 lbs, Lateral pinch: Right: 7 lbs, Left: 19 lbs, and 3 point pinch: Right: 2 lbs, Left: 16 lbs 10th visit: Grip strength: Right: 16  lbs;                                Lateral pinch: Right: 9  lbs;                            3 point pinch: Right: 5 lbs 20th visit: Grip strength: Right: 20 lbs; Left: 70 lbs, Lateral pinch: Right: 10 lbs, Left: 20 lbs, and 3 point pinch: Right: 9 lbs, Left: 20 lbs  30th visit: Grip strength: Right: 18 lbs; Left: 70 lbs, Lateral pinch: Right: 12 lbs, Left: 20 lbs, and 3 point pinch: Right: 8 lbs, Left: 20 lbs  03/28/7251 Recert: Grip strength: Right: 20 lbs; Left: 70 lbs, Lateral pinch: Right: 10 lbs, Left: 20 lbs, and 3 point pinch: Right: 8 lbs, Left: 20 lbs    Eval: COORDINATION: Finger Nose Finger test: difficult/lacks precision with reaching targets 9 Hole Peg test: Right: unable sec; Left: 27 sec Able to oppose 2nd and 3rd digits to thumb on R hand   10th visit 9 hole Peg test: Right: 11 min, 44 sec   20th visit:  9 hole Peg test: Right: 3 min 6 sec   30th visit:  9 hole Peg test: Right: 1 min 45 sec   6/64/4034 Recert:  9 hole Peg test: Right: 1 min 30 sec     TODAY'S TREATMENT:   Self-care:  Pt. worked on [employer] with emphasis placed on incorporating, and engaging the right hand. Patient worked on copying a list of words like by  using letters on the right side of the keyboard.  Patient worked on typing each word multiple times followed by  listing 1 after another in a row incorporating the use of, and space between each word. Pt. completed a 2 min. typing test in 3 wpm with 64% accuracy.    PATIENT EDUCATION: Education details:  compensatory strategies for accessing soup  Person educated: pt/daughter Education method: Merchandiser, retail cues Education comprehension: verbalized understanding  HOME EXERCISE PROGRAM: Theraputty, hand writing skills, table slides  GOALS: Goals reviewed with patient? Yes   SHORT TERM GOALS: Target date 05/31/2022       Pt will be indep to perform HEP for increasing strength and coordination throughout RUE.  Baseline: Not yet initiated; 10th: putty given but pt reports limited use, instructed in table slides and self PROM for R wrist/digits. 20th:  Pt performing exercises but requires continual upgrades and instruction 30th: Pt performing exercises but requires continual upgrades, modifications, and instruction as needed 04/19/2022: Independent with HEPs. Will continue to upgrade as indicated. Goal status: ongoing   2.  Pt will manage clothing fasteners with extra time, using RUE as an assist  Baseline: unable; pt has elastic laces and wears elastic waisted pants; 10th: pt uses R hand as an assist to zip and button jeans with extra time; not yet tried small buttons on a shirt.  Pt continues to use elastic laces on shoes. 20th:  Improving with manipulation skills but still using elastic laces, able to button pants and shirt with increased time and effort. 30th: Pt. Is able to fasten pant fasteners, and button pants efficiently, ans independently. Goal status: met   LONG TERM GOALS: Target date:07/12/2022   Pt will increase FOTO score to 55 or better to indicate improved performance with daily tasks.  Baseline: 48; 10th: 48; 20th: score:42 30th visit: 64 Goal status: Achieved   2.  Pt will increase R grip strength by 10 or more lbs to enable pt to hold and carry light ADL supplies without  dropping.  Baseline: R grip 14#, L 58#; 10th visit: R grip 16; 20th visit: 20# 30th: 18#, Pt. Continues to have difficulty holding supplies securely. 04/19/2022: R: 20# Goal status: ongoing   3.  Pt will increase RUE strength to be able to engage RUE into ADLs at least 50% of the time. Baseline: Pt using L non-dominant arm to manage ADLs; 10th visit: Son reports he constantly sees pt attempt to use his R arm for daily tasks, but continues to be limited and still must use LUE for most tasks (see chart for RUE MMT) 20th:  continues to improve on strength and is working on consistency of use. 30th visit: Pt. Continues to use the right hand during daily ADL, and IADL tasks. 04/19/2022: RUE strength continues to be limited. Pt. Is in the process of having his persistent arm pain assessed by an orthopedic physician. Pt. Is consistently engaging his right hand during ADLs more than  50% of the time.  Goal status: Partially met. Goal deferred, and new goal added for Right shoulder ROM    4.  Pt will complete 9 hole peg test on the R in 1 min or less to work towards ability to use R hand to pick up small ADL supplies.  Baseline: Pt can remove a peg but can not pick one up; 10th visit: R 9 hole completion 11 min 44 sec; 20th visit:   Pt improved to 3 mins and 6 secs, nearing  goal. 30 visit: 1 min. & 45 sec. 04/19/2022: 1 min. & 30 sec. Goal status: Ongoing   5.  Pt will improve right pinch  strength to be able to open a tab on a can of soup with modified independence.  Baseline: Right lateral pinch 10#, 3pt. Pinch 8#. Pt. is unable to open, and pull the tab on a can of soup. Goal status: New  6. Pt. will increase right shoulder ROM by 10 degrees to improve functional reach during ADLs, and IADL. Baseline: Right shoulder AROM flexion in sitting; 87, Abduction: 86 Goal status: New  ASSESSMENT:  CLINICAL IMPRESSION:  Patient reports that he will not go home this weekend so that he can spend time with his  great-grandchildren. Patient reports that he has a doctor's appointment next week to follow-up with his right shoulder MRI. Pt. required increased cues to engage his right hand when typing words, and requires increased time to complete.  Requires increased time and typing. Patient is presenting with increased isolated digit movement when typing the letters with each digit extended. Continues to make multiple errors, and mistypes. Pt. continues to work on increasing strength and coordination in the right hand to improve overall RUE functioning for increased independence with ADLs, and IADL tasks.   PERFORMANCE DEFICITS in functional skills including ADLs, IADLs, coordination, dexterity, ROM, strength, pain, flexibility, FMC, GMC, mobility, balance, continence, decreased knowledge of use of DME, and UE functional use, cognitive skills including safety awareness, and psychosocial skills including.   IMPAIRMENTS are limiting patient from ADLs, IADLs, leisure, and social participation.   COMORBIDITIES may have co-morbidities  that affects occupational performance. Patient will benefit from skilled OT to address above impairments and improve overall function.  MODIFICATION OR ASSISTANCE TO COMPLETE EVALUATION: Min-Moderate modification of tasks or assist with assess necessary to complete an evaluation.  OT OCCUPATIONAL PROFILE AND HISTORY: Problem focused assessment: Including review of records relating to presenting problem.  CLINICAL DECISION MAKING: Moderate - several treatment options, min-mod task modification necessary  REHAB POTENTIAL: Good  EVALUATION COMPLEXITY: Moderate    PLAN: OT FREQUENCY: 2x/week  OT DURATION: 12 weeks  PLANNED INTERVENTIONS: self care/ADL training, therapeutic exercise, therapeutic activity, neuromuscular re-education, manual therapy, passive range of motion, balance training, functional mobility training, moist heat, cryotherapy, patient/family education, cognitive  remediation/compensation, energy conservation, coping strategies training, and DME and/or AE instructions  RECOMMENDED OTHER SERVICES: N/A  CONSULTED AND AGREED WITH PLAN OF CARE: Patient and family member/caregiver  PLAN FOR NEXT SESSION: HEP progression, neuro re-ed, therapeutic exercises   Harrel Carina, MS, OTR/L

## 2022-04-26 NOTE — Therapy (Addendum)
OUTPATIENT SPEECH LANGUAGE PATHOLOGY TREATMENT NOTE PROGRESS NOTE AND RECERTIFICATION   Patient Name: Gerald Bauer MRN: [id number] DOB:1945/05/21, 77 y.o., male Today's Date: 05/01/2022  PCP: [name], PA-C Referring Provider:  [name], PA-C  Speech Therapy Progress Note  Dates of Reporting Period: 03/07/23 to 04/26/2022  Objective: Patient has been seen for 10 speech therapy sessions this reporting period targeting apraxia and aphasia. Patient met 3 LTGs this reporting period, 2 of which were revised and updated; and STGs were modified at visit 34. See skilled intervention, clinical impressions, and goals below for details.  END OF SESSION:     04/26/22 1008  SLP Visits / Re-Eval  Visit Number 40  Number of Visits 55  Date for SLP Re-Evaluation 07/24/22  SLP Time Calculation  SLP Start Time 0900  SLP Stop Time  1000  SLP Time Calculation (min) 60 min  SLP - End of Session  Activity Tolerance Patient tolerated treatment well     Past Medical History:  Diagnosis Date   Acid reflux    Aortic atherosclerosis ([place])    a. Noted on CT Abd in 2012.   Arthritis    hands   Carotid atherosclerosis    a. 02/2018 U/S: RICA 1-74%, LICA 08-14%; b. 06/8183 MRA Head/Neck: near complete occlusion L ICA and L MCA. Patent RICA.   Diabetes mellitus without complication (HCC)    Diastolic dysfunction    a. 09/2021 Echo: EF 55-60%, no rwma, GrI DD, nl RV fxn, RVSP 14.75mHg.   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 09/2021 MRI/MRA Brain: Acute L MCA distribution infarct. Near complete occlusion L ICA and MCA.   Tachycardia    Tinea pedis of both feet 01/10/2017   Past Surgical  History:  Procedure Laterality Date   APPENDECTOMY  1973   CHOLECYSTECTOMY  2013   PROSTATECTOMY  2012   Patient Active Problem List   Diagnosis Date Noted   H/O prostatectomy 02/06/2022   Hemiparesis, aphasia, and dysphagia as late effects of stroke ([facility] 11/07/2021   Lower urinary tract symptoms (LUTS) 11/07/2021   Myalgia due to statin 02/21/2021   Type 2 diabetes mellitus without complication, without long-term current use of insulin ([place] 02/21/2021   Aortic atherosclerosis ([facility] 04/27/2020   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   Onychomycosis of multiple toenails with type 2 diabetes mellitus ([facility] 07/19/2016   Carotid stenosis 03/13/2016   Hyperlipidemia associated with type 2 diabetes mellitus ([facility]    Hypertension associated with type 2 diabetes mellitus ([facility]    Personal history of prostate cancer    Carotid atherosclerosis    GERD (gastroesophageal reflux disease) 08/20/2013    ONSET DATE: 10/05/2021    REFERRING DIAG: Cerebral infarction due to unspecified occlusion of left middle cerebral artery   THERAPY DIAG:  Verbal apraxia Verbal apraxia   Rationale for Evaluation and Treatment Rehabilitation   SUBJECTIVE STATEMENT:           Pt reports feeling really good today.  Pt accompanied by: daughter-in-law   PERTINENT HISTORY: Pt  is a [age]year old male with history of left-sided carotid stenosis, non-insulin-dependent diabetes mellitus type 2, PAD, GERD, mild COPD,  history of tobacco use, hyperlipidemia, hypertension, who presented 10/05/21 to [facility] ED with facial droop, expressive aphasia, and R sided weakness and found to have left MCA CVA. He was [facility] 7/17-10/31/21. Advanced to dysphagia 3/thin liquids by cup by time of d/c from CIR. 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 Difficult to fully assess cognition given severity of linguistic impairment; however, does  present with decreased safety awareness, diminished problem-solving, emergent awareness, and mild impulsivity with movement."     DIAGNOSTIC FINDINGS: MBS 10/19/21: "Pt presents with overall mild oropharyngeal dysphagia. Oral phase c/b piecemeal deglutition, decreased mastication, decreased bolus cohesion, premature spillage, and weak lingual manipulation. Pharyngeal phase c/b reduced pharyngeal peristalsis + reduced BOT approximation to PPW, which results in mild to moderate vallecular and pyriform sinuses residuals (vallecula > pyriform sinuses for solids, pyriform sinuses > vallecula for liquids). No penetration nor aspiration appreciated with thin liquid via cup, Dysphagia 1 (puree), Dysphagia 2 (chopped), Dysphagia 3 (mechanical soft), or with 13 mm Barium tablet placed in puree. Once instance of sensed aspiration with intake of large bolus of thin liquid via straw due to swallow initiation delay to pyriform sinuses and mistiming of swallow-breath cycle; despite strong reflexive cough aspirant was not fully cleared from trachea. Pt's level of swallow initiation was noted to vary with liquid consumption (over base of epiglottis and at pyriform sinuses) with level of swallow initiation for solids to be consistent at the vallecula. Pharyngeal stasis was partially cleared with reflexive secondary swallow." MRI brain 10/05/21: 1. 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 No associated hemorrhage or significant regional mass effect. 2. Loss of normal flow voids throughout the left ICA and MCA, consistent with slow flow and/or occlusion. 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 3. Underlying mild chronic microvascular ischemic disease. Tiny remote left ACA distribution infarct.   PAIN:  Are you having pain? No   PATIENT GOALS talk better   OBJECTIVE:    TODAY'S TREATMENT:  Facilitated simple-mod complex verbal expression  with less familiar listener (graduate student) through picture description task and taboo to target intelligibility and use of strategies. Patient generated descriptive sentences of 20 pictures and described 5 words during taboo. Intelligibility to less familiar listener at the sentence level was 80-90% with repairs (occasional min-mod cues). Intelligibility accuracy increased with clinician placing the tapping stick closer to the picture being described. Probation officer to provide feedback and generate patient awareness of communication breakdowns. Min-moderate cues were used to remind patient to use tapping stick, slow down, and repeat sentences. Clinician emphasized speaking up when people don't understand what he's saying, and repeat back something different from his original production.   PATIENT EDUCATION: Education details: using strategies for intelligible speech, letting people know when they're not understand his speech productions Person educated: Patient  Education method: Explanation Education comprehension: verbalized understanding and needs further education     GOALS: Goals reviewed with patient? Yes    SHORT TERM GOALS: Target date: 10 sessions   Patient will participate in clinical assessment of swallow function with goals added as needed. Baseline: Goal status: MET   2.  Pt will communicate emergency information 100% accuracy using visual aid if necessary.  Baseline:  Goal status: MET   3.  Pt will approximate personally relevant words and phrases >80% accuracy using script training and min visual cues for apraxia.  Baseline:  Goal status: MET   4.  Pt will generate at least 4 descriptors of target word 80% of the time using semantic feature analysis to improve abilities in wordfinding and resolving communication breakdowns.  Baseline:  Goal status: MET   5.  Pt will complete HEP for dysphagia with rare min A. Baseline:  Goal status: MET   6.  Pt  will generate sentences using personally relevant words list for >80% intelligibility. Baseline: 50% intelligibility Goal status: MET   7.  Pt will improve expressive language skills by taking 4-6 turns in simple conversation, with supported conversation strategies if needed. Baseline:  Goal status: MET 8.  Pt will generate sentences moderately complex sentences 8-10 words >80% intelligibility, with self-correction/multimodal communication allowed. Baseline: 50% intelligibility Goal status: MET   9.  Patient will use intelligibility strategies and script training for 4-6 turn telephone exchange with family member or friend. Baseline:  Goal status: MET  10.  Pt will demonstrate operational competence using communication device by independently powering on/off his device, navigating forward/back and selecting icons. Baseline:  Goal status: DEFERRED   11.  Pt will answer simple questions using AAC device to provide information re: emotional state, pain, and preferences. Baseline:  Goal status: DEFERRED   12.  Pt will use AAC in social exchange of 2-4 turns to share and request information.  Baseline:  Goal status: DEFERRED  13.  Pt will achieve 80% intelligibility in 6-8 turn exchange with less familiar listener or unknown context. Baseline: by visit 44 Goal status: ONGOING  14.  Pt will monitor for signs of listener comprehension at sentence level and initiate repair when breakdowns occur in 80% of opportunities. Baseline: by visit 44 Goal status: ONGOING        LONG TERM GOALS: Target date: 07/24/2022   Pt will engage in 10-12 minutes simple-mod complex conversation re: topic of interest with supported conversation, aphasia compensations.  Baseline: 01/25/22: 5 turn exchange. Goal renewed 01/25/22, achieved 5-8 min 04/26/22 and goal revised.  Goal status: REVISED   2.  Pt will ID and attempt repair of communication breakdowns >90% of the time for less familiar listener.     Baseline: 01/25/22: requires occasional mod cues to initiate correction; renewed 01/25/22, achieved 04/26/22 and revised for less-familiar listener Goal status: REVISED   3.  Pt/family will demonstrate knowledge of community resources and activities to support language/communication. Baseline: 01/25/22: referral completed to [facility]; pt has yet to register for group, renewed 01/25/22 Goal status: ACHIEVED   4.  Pt will demonstrate use of swallowing precautions independently with s/sx aspiration <5% of trials. Baseline: 01/25/22: no overt s/sx when sipping water in sessions, pt and family report no issues at home with meals Goal status: DEFERRED       ASSESSMENT:   CLINICAL IMPRESSION: Patient continues with aphasia and verbal apraxia, with improvement in aphasia severity (mild) noted compared with initial evaluation (moderate). 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 Intelligibility is impacted significantly at the sentence level, but has improved with the usage of a tapping stick which helps him focus on slowing down his rate of speech. He has used some scripts to improve intelligibility for short interactions with family/community, however pt still remains limited in his ability to convey information quickly and outside of highly structured interactions. For this reason, have discussed benefits and potential for using a communication device to supplement and augment pt's verbal communication; pt was initially  interested in pursuing this but has opted to continue with focus on verbal communication. I recommend skilled ST to improve pt's language and motor speech skills to improve communication and reduce frustration. Patient remains highly motivated and demonstrates slow, steady improvements. Recertification completed today; goals updated.   OBJECTIVE IMPAIRMENTS include expressive language, receptive language, aphasia, apraxia,  dysarthria, and dysphagia. These impairments are limiting patient from managing medications, managing appointments, managing finances, household responsibilities, ADLs/IADLs, effectively communicating at home and in community, and safety when swallowing. Factors affecting potential to achieve goals and functional outcome are cooperation/participation level; pt appears highly motivated with good family support. Patient will benefit from skilled SLP services to address above impairments and improve overall function.   REHAB POTENTIAL: Good   PLAN: SLP FREQUENCY: 2x/week   SLP DURATION: 12 weeks   PLANNED INTERVENTIONS: Aspiration precaution training, Pharyngeal strengthening exercises, Diet toleration management , Language facilitation, Environmental controls, Trials of upgraded texture/liquids, Cognitive reorganization, Internal/external aids, Functional tasks, Multimodal communication approach, SLP instruction and feedback, Compensatory strategies, and Patient/family education  [name], MS, CCC-SLP Speech-Language Pathologist [phone]   [name], [place] 05/01/2022, 4:41 PM  [facility] [place], [place], [address] Phone: [phone]   Fax:  [phone]

## 2022-05-01 ENCOUNTER — Encounter: Payer: Self-pay | Admitting: Speech Pathology

## 2022-05-01 ENCOUNTER — Ambulatory Visit: Payer: Medicare Other | Admitting: Occupational Therapy

## 2022-05-01 ENCOUNTER — Ambulatory Visit: Payer: Medicare Other | Admitting: Speech Pathology

## 2022-05-01 ENCOUNTER — Ambulatory Visit: Payer: Medicare Other

## 2022-05-01 DIAGNOSIS — R482 Apraxia: Secondary | ICD-10-CM | POA: Diagnosis not present

## 2022-05-01 DIAGNOSIS — R4701 Aphasia: Secondary | ICD-10-CM

## 2022-05-01 DIAGNOSIS — M6281 Muscle weakness (generalized): Secondary | ICD-10-CM

## 2022-05-01 DIAGNOSIS — R278 Other lack of coordination: Secondary | ICD-10-CM

## 2022-05-01 NOTE — Addendum Note (Signed)
Addended by: [name] on: 05/01/2022 04:48 PM   Modules accepted: Orders

## 2022-05-01 NOTE — Therapy (Signed)
OUTPATIENT SPEECH LANGUAGE PATHOLOGY TREATMENT NOTE   Patient Name: Gerald Bauer MRN: [id number] DOB:1946-01-18, 77 y.o., male Today's Date: 05/01/2022  PCP: [name], PA-C Referring Provider:  [name], PA-C  END OF SESSION:   End of Session - 05/01/22 1114     Visit Number 41    Number of Visits 4    Date for SLP Re-Evaluation 07/24/22    SLP Start Time 0900    SLP Stop Time  1000    SLP Time Calculation (min) 60 min    Activity Tolerance Patient tolerated treatment well             Past Medical History:  Diagnosis Date   Acid reflux    Aortic atherosclerosis ([employer])    a. Noted on CT Abd in 2012.   Arthritis    hands   Carotid atherosclerosis    a. 02/2018 U/S: RICA 6-28%, LICA 31-51%; b. 09/6158 MRA Head/Neck: near complete occlusion L ICA and L MCA. Patent RICA.   Diabetes mellitus without complication (HCC)    Diastolic dysfunction    a. 09/2021 Echo: EF 55-60%, no rwma, GrI DD, nl RV fxn, RVSP 14.67mHg.   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 09/2021 MRI/MRA Brain: Acute L MCA distribution infarct. Near complete occlusion L ICA and MCA.   Tachycardia    Tinea pedis of both feet 01/10/2017   Past Surgical History:  Procedure Laterality Date   APPENDECTOMY  1973   CHOLECYSTECTOMY  2013   PROSTATECTOMY  2012   Patient Active Problem List   Diagnosis Date Noted   H/O prostatectomy 02/06/2022   Hemiparesis, aphasia, and dysphagia as late effects of [place] ([place] 11/07/2021   Lower urinary tract symptoms (LUTS) 11/07/2021   Myalgia due to statin 02/21/2021   Type 2 diabetes mellitus without complication, without long-term current use of insulin ([place]  02/21/2021   Aortic atherosclerosis ([facility] 04/27/2020   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   Onychomycosis of multiple toenails with type 2 diabetes mellitus ([place] 07/19/2016   Carotid stenosis 03/13/2016   Hyperlipidemia associated with type 2 diabetes mellitus ([facility] [place]    Hypertension associated with type 2 diabetes mellitus ([place]    Personal history of prostate cancer    Carotid atherosclerosis    GERD (gastroesophageal reflux disease) 08/20/2013    ONSET DATE: 10/05/2021    REFERRING DIAG: Cerebral infarction due to unspecified occlusion of left middle cerebral artery   THERAPY DIAG:  Verbal apraxia Verbal apraxia   Rationale for Evaluation and Treatment Rehabilitation   SUBJECTIVE STATEMENT:           Pt reports having a restful weekend.  Pt accompanied by: daughter-in-law   PERTINENT HISTORY: Pt  is a 89year old male with history of left-sided carotid stenosis, non-insulin-dependent diabetes mellitus type 2, PAD, GERD, mild COPD, history of tobacco use, hyperlipidemia, hypertension, [facility], expressive aphasia, and R sided weakness and found to have left MCA CVA. [facility]. Advanced to dysphagia 3/thin liquids by cup by time of d/c from CIR. 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 Difficult to fully assess cognition given severity of linguistic impairment; however, does present with decreased safety awareness, diminished problem-solving, emergent awareness, and mild impulsivity with movement."     DIAGNOSTIC FINDINGS: MBS 10/19/21: "Pt presents with overall mild oropharyngeal dysphagia. Oral phase c/b piecemeal deglutition, decreased mastication, decreased bolus cohesion, premature spillage, and weak lingual manipulation. Pharyngeal phase c/b reduced pharyngeal peristalsis + reduced BOT approximation to PPW, which results in  mild to moderate vallecular and pyriform sinuses residuals (vallecula > pyriform sinuses for solids, pyriform sinuses > vallecula for liquids). No penetration nor aspiration appreciated with thin liquid via cup, Dysphagia 1 (puree), Dysphagia 2 (chopped), Dysphagia 3 (mechanical soft), or with 13 mm Barium tablet placed in puree. Once instance of sensed aspiration with intake of large bolus of thin liquid via straw due to swallow initiation delay to pyriform sinuses and mistiming of swallow-breath cycle; despite strong reflexive cough aspirant was not fully cleared from trachea. Pt's level of swallow initiation was noted to vary with liquid consumption (over base of epiglottis and at pyriform sinuses) with level of swallow initiation for solids to be consistent at the vallecula. Pharyngeal stasis was partially cleared with reflexive secondary swallow." MRI brain 10/05/21: 1. 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 No associated hemorrhage or significant regional mass effect. 2. Loss of normal flow voids throughout the left ICA and MCA, consistent with slow flow and/or occlusion. 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 3. Underlying mild chronic microvascular ischemic disease. Tiny remote left ACA distribution infarct.   PAIN:  Are you having pain? No   PATIENT GOALS talk better   OBJECTIVE:    TODAY'S TREATMENT:  Facilitated simple-mod complex verbal expression with less familiar listener (graduate student) through sentence creation and taboo to target intelligibility and use of strategies. Patient generated descriptive sentences of 20 words related to cars and their parts and described 6 words during taboo. Intelligibility to less familiar listener at the sentence level was 80-90% with repairs (occasional min-mod cues; patient self-cued self to use tapping stick throughout session). Patient used gestures and photos to  facilitate conversation regarding sentences. Intelligibility accuracy increased with patient increasing volume and using accurate amount of taps for words with more syllables. Probation officer to provide feedback and generate patient awareness of communication breakdowns. Min-moderate cues were used to remind patient to slow down, repeat sentences, and taking breaks when feeling fatigued.   PATIENT EDUCATION: Education details: using strategies for intelligible speech, taking breaks when feeling fatigued  Person educated: Patient  Education method: Explanation Education comprehension: verbalized understanding and needs further education     GOALS: Goals reviewed with patient? Yes   SHORT TERM GOALS: Target date: 10 sessions   Patient will participate in clinical assessment of swallow function with goals added as needed. Baseline: Goal status: MET   2.  Pt will communicate emergency information 100% accuracy using visual aid if necessary.  Baseline:  Goal status: MET   3.  Pt will approximate personally relevant words and phrases >80% accuracy using script training and min visual cues for apraxia. Baseline:  Goal status: MET   4.  Pt will generate at least 4 descriptors of target word 80% of the time using semantic feature analysis to improve abilities in wordfinding and resolving communication breakdowns.  Baseline:  Goal status: MET   5.  Pt will complete HEP for dysphagia  with rare min A. Baseline:  Goal status: MET   6.  Pt will generate sentences using personally relevant words list for >80% intelligibility. Baseline: 50% intelligibility Goal status: MET   7.  Pt will improve expressive language skills by taking 4-6 turns in simple conversation, with supported conversation strategies if needed. Baseline:  Goal status: MET 8.  Pt will generate sentences moderately complex sentences 8-10 words >80% intelligibility, with self-correction/multimodal  communication allowed. Baseline: 50% intelligibility Goal status: MET   9.  Patient will use intelligibility strategies and script training for 4-6 turn telephone exchange with family member or friend. Baseline:  Goal status: MET  10.  Pt will demonstrate operational competence using communication device by independently powering on/off his device, navigating forward/back and selecting icons. Baseline:  Goal status: DEFERRED   11.  Pt will answer simple questions using AAC device to provide information re: emotional state, pain, and preferences. Baseline:  Goal status: DEFERRED   12.  Pt will use AAC in social exchange of 2-4 turns to share and request information.  Baseline:  Goal status: DEFERRED  13.  Pt will achieve 80% intelligibility in 6-8 turn exchange with less familiar listener or unknown context. Baseline: by visit 44 Goal status: ONGOING  14.  Pt will monitor for signs of listener comprehension at sentence level and initiate repair when breakdowns occur in 80% of opportunities. Baseline: by visit 44 Goal status: ONGOING        LONG TERM GOALS: Target date: 07/24/2022   Pt will engage in 10-12 minutes simple-mod complex conversation re: topic of interest with supported conversation, aphasia compensations.  Baseline: 01/25/22: 5 turn exchange. Goal renewed 01/25/22, achieved 5-8 min 04/26/22 and goal revised.  Goal status: REVISED   2.  Pt will ID and attempt repair of communication breakdowns >90% of the time for less familiar listener.    Baseline: 01/25/22: requires occasional mod cues to initiate correction; renewed 01/25/22, achieved 04/26/22 and revised for less-familiar listener Goal status: REVISED   3.  Pt/family will demonstrate knowledge of community resources and activities to support language/communication. Baseline: 01/25/22: referral completed to [facility]; pt has yet to register for group, renewed 01/25/22 Goal status: ACHIEVED   4.  Pt will demonstrate use of  swallowing precautions independently with s/sx aspiration <5% of trials. Baseline: 01/25/22: no overt s/sx when sipping water in sessions, pt and family report no issues at home with meals Goal status: DEFERRED       ASSESSMENT:   CLINICAL IMPRESSION: Patient continues with aphasia and verbal apraxia, with improvement in aphasia severity (mild) noted compared with initial evaluation (moderate). 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 Intelligibility is impacted significantly at the sentence level, but has improved with the usage of a tapping stick which helps him focus on slowing down his rate of speech. Patient's usage of gestures and rephrasing sentences has increased. He has used some scripts to improve intelligibility for short interactions with family/community, however pt still remains limited in his ability to convey information quickly and outside of highly structured interactions. For this reason, have discussed benefits and potential for using a communication device to supplement and augment pt's verbal communication; pt was initially interested in pursuing this but has opted to continue with focus on verbal communication. I recommend skilled ST to improve pt's language and motor speech skills to improve communication and reduce frustration.    OBJECTIVE IMPAIRMENTS include expressive language, receptive language, aphasia,  apraxia, dysarthria, and dysphagia. These impairments are limiting patient from managing medications, managing appointments, managing finances, household responsibilities, ADLs/IADLs, effectively communicating at home and in community, and safety when swallowing. Factors affecting potential to achieve goals and functional outcome are cooperation/participation level; pt appears highly motivated with good family support. Patient will benefit from skilled SLP services to address above impairments and  improve overall function.   REHAB POTENTIAL: Excellent   PLAN: SLP FREQUENCY: 2x/week   SLP DURATION: 12 weeks   PLANNED INTERVENTIONS: Aspiration precaution training, Pharyngeal strengthening exercises, Diet toleration management , Language facilitation, Environmental controls, Trials of upgraded texture/liquids, Cognitive reorganization, Internal/external aids, Functional tasks, Multimodal communication approach, SLP instruction and feedback, Compensatory strategies, and Patient/family education  [name], MS, CCC-SLP Speech-Language Pathologist [phone]   [name], [place] 05/01/2022, 4:31 PM  [place] [facility] [place], [place], [address] Phone: [phone]   Fax:  [phone]

## 2022-05-01 NOTE — Therapy (Signed)
Occupational Therapy Progress Note  Dates of reporting period  03/15/2022   to   05/01/2022  MRN: [id number] DOB:26-May-1945, 77 y.o., male Today's Date: 03/06/2022  PCP: Threasa Alpha, PA REFERRING PROVIDER: Reesa Chew    OT End of Session - 05/01/22 1226     Visit Number 40    Number of Visits 59    Date for OT Re-Evaluation 07/12/22    OT Start Time 1017    OT Stop Time 1100    OT Time Calculation (min) 43 min    Activity Tolerance Patient tolerated treatment well    Behavior During Therapy WFL for tasks assessed/performed                        Past Medical History:  Diagnosis Date   Acid reflux    Aortic atherosclerosis ([place])    a. Noted on CT Abd in 2012.   Arthritis    hands   Carotid atherosclerosis    a. 02/2018 U/S: RICA 8-45%, LICA 36-46%; b. 10/319 MRA Head/Neck: near complete occlusion L ICA and L MCA. Patent RICA.   Diabetes mellitus without complication (HCC)    Diastolic dysfunction    a. 09/2021 Echo: EF 55-60%, no rwma, GrI DD, nl RV fxn, RVSP 14.63mHg.   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 09/2021 MRI/MRA Brain: Acute L MCA distribution infarct. Near complete occlusion L ICA and MCA.   Tachycardia    Tinea pedis of both feet 01/10/2017   Past Surgical History:  Procedure Laterality Date   APPENDECTOMY  1973   CHOLECYSTECTOMY  2013   PROSTATECTOMY  2012   Patient Active Problem List   Diagnosis Date Noted   H/O prostatectomy 02/06/2022   Hemiparesis, aphasia, and dysphagia as late effects of stroke ([facility] [place] 11/07/2021   Lower urinary tract symptoms (LUTS) 11/07/2021   Myalgia due to statin 02/21/2021   Type 2 diabetes mellitus without  complication, without long-term current use of insulin ([facility] 02/21/2021   Aortic atherosclerosis ([facility] 04/27/2020   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   Onychomycosis of multiple toenails with type 2 diabetes mellitus ([facility] 07/19/2016   Carotid stenosis 03/13/2016   Hyperlipidemia associated with type 2 diabetes mellitus ([facility] [place]    Hypertension associated with type 2 diabetes mellitus ([facility]    Personal history of prostate cancer    Carotid atherosclerosis    GERD (gastroesophageal reflux disease) 08/20/2013    ONSET DATE: 10/05/21  REFERRING DIAG: L MCA CVA  THERAPY DIAG:  Muscle weakness (generalized)  Other lack of coordination  Rationale for Evaluation and Treatment Rehabilitation  SUBJECTIVE:   SUBJECTIVE STATEMENT:   Pt.  reports that his shoulder feels better.   PERTINENT HISTORY: Per chart, WPearson Picouis a 77year old right-handed male with history of hypertension, hyperlipidemia, diabetes mellitus, prostate cancer/prostatectomy 2012, quit smoking 24 years ago.  [facility] right side weakness/facial droop and expressive aphasia.  Blood pressure 155/102.  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  No associated hemorrhage or significant mass effect.    PAIN:  Are you having pain?  No reports of pain today  FALLS: Has patient fallen in last 6 months? Yes. Number of falls 3 since CVA  PLOF: Independent/retired Dealer  PATIENT GOALS : Pt points to his hand (wants to be able to use his hand)  OBJECTIVE:   HAND DOMINANCE: Right   FUNCTIONAL OUTCOME MEASURES: FOTO: 48  UUPPER EXTREMITY ROM      Active ROM Right eval Left Eval WNL Right 12/11/21 Right 01/23/2022 Right 03/15/2022 Right 04/19/2022  Shoulder flexion 90 (135)   95 (110) 95 97 sitting 87 sitting  Shoulder abduction 95 (110)   80 (90) 95 96 sitting 86 sitting  Wrist flex     54 60 62 62  Wrist ext     41 45 48 50    UPPER EXTREMITY MMT:       MMT Right eval Left Eval 5/5 Right 12/11/21 Right 03/15/22 Right 04/19/2022  Shoulder flexion 3-   -3 3- 3-  Shoulder abduction 3-   -3 3- 3-  Shoulder adduction          Shoulder extension          Shoulder internal rotation 3-        Shoulder external rotation 3-        Middle trapezius          Lower trapezius          Elbow flexion 4-   4 4+ 5  Elbow extension 4+   '5 5 5  '$ Wrist flexion 4   NT d/t pain 4 4+  Wrist extension 4-   NT d/t pain 4 4+  (Blank rows = not tested)   HAND FUNCTION: Eval:         Grip strength: Right: 14 lbs; Left: 58 lbs, Lateral pinch: Right: 7 lbs, Left: 19 lbs, and 3 point pinch: Right: 2 lbs, Left: 16 lbs 10th visit: Grip strength: Right: 16  lbs;                                Lateral pinch: Right: 9  lbs;                            3 point pinch: Right: 5 lbs 20th visit: Grip strength: Right: 20 lbs; Left: 70 lbs, Lateral pinch: Right: 10 lbs, Left: 20 lbs, and 3 point pinch: Right: 9 lbs, Left: 20 lbs  30th visit: Grip strength: Right: 18 lbs; Left: 70 lbs, Lateral pinch: Right: 12 lbs, Left: 20 lbs, and 3 point pinch: Right: 8 lbs, Left: 20 lbs  2/75/1700 Recert: Grip strength: Right: 20 lbs; Left: 70 lbs, Lateral pinch: Right: 10 lbs, Left: 20 lbs, and 3 point pinch: Right: 8 lbs, Left: 20 lbs    Eval: COORDINATION: Finger Nose Finger test: difficult/lacks precision with reaching targets 9 Hole Peg test: Right: unable sec; Left: 27 sec Able to oppose 2nd and 3rd digits to thumb on R hand   10th visit 9 hole Peg test: Right: 11 min, 44 sec   20th visit:  9 hole Peg test: Right: 3 min 6 sec   30th visit:  9 hole Peg test: Right: 1 min 45 sec   1/74/9449 Recert:  9 hole Peg test: Right: 1 min 30 sec     TODAY'S TREATMENT:   Self-care:  Pt. worked on [employer] with emphasis placed on incorporating, and engaging  the right hand. Patient worked on copying a list of words like by using letters on the right side of the keyboard.  Patient  worked on typing each word multiple times followed by listing 1 after another in a row incorporating the use of, and space between each word. Pt. completed a 2 min. typing test in 5 wpm with 72% accuracy.    PATIENT EDUCATION: Education details:  compensatory strategies for accessing soup  Person educated: pt/daughter Education method: Merchandiser, retail cues Education comprehension: verbalized understanding  HOME EXERCISE PROGRAM: Theraputty, hand writing skills, table slides  GOALS: Goals reviewed with patient? Yes   SHORT TERM GOALS: Target date 05/31/2022       Pt will be indep to perform HEP for increasing strength and coordination throughout RUE.  Baseline: Not yet initiated; 10th: putty given but pt reports limited use, instructed in table slides and self PROM for R wrist/digits. 20th:  Pt performing exercises but requires continual upgrades and instruction 30th: Pt performing exercises but requires continual upgrades, modifications, and instruction as needed 04/19/2022: Independent with HEPs. Will continue to upgrade as indicated. 40th visit: Independent Goal status: ongoing   2.  Pt will manage clothing fasteners with extra time, using RUE as an assist  Baseline: unable; pt has elastic laces and wears elastic waisted pants; 10th: pt uses R hand as an assist to zip and button jeans with extra time; not yet tried small buttons on a shirt.  Pt continues to use elastic laces on shoes. 20th:  Improving with manipulation skills but still using elastic laces, able to button pants and shirt with increased time and effort. 30th: Pt. Is able to fasten pant fasteners, and button pants efficiently, ans independently. Goal status: met   LONG TERM GOALS: Target date:07/12/2022   Pt will increase FOTO score to 55 or better to indicate improved performance with daily tasks.  Baseline: 48; 10th: 48; 20th: score:42 30th visit: 64, 40th visit TBD Goal status: Achieved   2.  Pt will increase R  grip strength by 10 or more lbs to enable pt to hold and carry light ADL supplies without dropping.  Baseline: R grip 14#, L 58#; 10th visit: R grip 16; 20th visit: 20# 30th: 18#, Pt. Continues to have difficulty holding supplies securely. 04/19/2022: R: 20# 40th visit: Pt. Continues to have difficulty holding supplies securely Goal status: ongoing   3.  Pt will increase RUE strength to be able to engage RUE into ADLs at least 50% of the time. Baseline: Pt using L non-dominant arm to manage ADLs; 10th visit: Son reports he constantly sees pt attempt to use his R arm for daily tasks, but continues to be limited and still must use LUE for most tasks (see chart for RUE MMT) 20th:  continues to improve on strength and is working on consistency of use. 30th visit: Pt. Continues to use the right hand during daily ADL, and IADL tasks. 04/19/2022: RUE strength continues to be limited. Pt. Is in the process of having his persistent arm pain assessed by an orthopedic physician. Pt. Is consistently engaging his right hand during ADLs more than  50% of the time.  Goal status: Partially met. Goal deferred, and new goal added for Right shoulder ROM    4.  Pt will complete 9 hole peg test on the R in 1 min or less to work towards ability to use R hand to pick up small ADL supplies.  Baseline: Pt can remove a peg  but can not pick one up; 10th visit: R 9 hole completion 11 min 44 sec; 20th visit:   Pt improved to 3 mins and 6 secs, nearing goal. 30 visit: 1 min. & 45 sec. 04/19/2022: 1 min. & 30 sec. 40th visit: Pt. Continues to present with limited [facility] skill, and difficulty manipulating small objects. Goal status: Ongoing   5.  Pt will improve right pinch  strength to be able to open a tab on a can of soup with modified independence.  Baseline: Right lateral pinch 10#, 3pt. Pinch 8#. Pt. is unable to open, and pull the tab on a can of soup. 40th visit: Pt.  Has difficulty opening, and pulling the tab on a can of  soup Goal status: Ongoing  6. Pt. will increase right shoulder ROM by 10 degrees to improve functional reach during ADLs, and IADL. Baseline: Right shoulder AROM flexion in sitting; 87, Abduction: 86; 40th visit: Right shoulder flexion in limited. New diagnosis for frozen shoulder, received an injection yesterday. Pt. reports having a good response with decreased pain following. Goal status: Ongoing  ASSESSMENT:  CLINICAL IMPRESSION:  Pt. had an MD appointment yesterday to follow-up with his right shoulder MRI. Pt. has a new diagnosis of a right frozen shoulder. Pt. received an injection in the shoulder. Pt. reports improved pain. Pt. has progressed slightly with typing speed, and accuracy from the previous session. Pt. required fewer cues to engage his right hand when typing words, and requires increased time to complete. Pt. requires increased time, and verbal cues during typing tasks. Patient is presenting with increased isolated digit movement when typing the letters with each digit extended. Pt. continues to make multiple errors, and mistypes. Pt. continues to work on increasing right shoulder ROM, strength and coordination in the right hand to improve overall RUE functioning for increased independence with ADLs, and IADL tasks.   PERFORMANCE DEFICITS in functional skills including ADLs, IADLs, coordination, dexterity, ROM, strength, pain, flexibility, FMC, GMC, mobility, balance, continence, decreased knowledge of use of DME, and UE functional use, cognitive skills including safety awareness, and psychosocial skills including.   IMPAIRMENTS are limiting patient from ADLs, IADLs, leisure, and social participation.   COMORBIDITIES may have co-morbidities  that affects occupational performance. Patient will benefit from skilled OT to address above impairments and improve overall function.  MODIFICATION OR ASSISTANCE TO COMPLETE EVALUATION: Min-Moderate modification of tasks or assist with  assess necessary to complete an evaluation.  OT OCCUPATIONAL PROFILE AND HISTORY: Problem focused assessment: Including review of records relating to presenting problem.  CLINICAL DECISION MAKING: Moderate - several treatment options, min-mod task modification necessary  REHAB POTENTIAL: Good  EVALUATION COMPLEXITY: Moderate    PLAN: OT FREQUENCY: 2x/week  OT DURATION: 12 weeks  PLANNED INTERVENTIONS: self care/ADL training, therapeutic exercise, therapeutic activity, neuromuscular re-education, manual therapy, passive range of motion, balance training, functional mobility training, moist heat, cryotherapy, patient/family education, cognitive remediation/compensation, energy conservation, coping strategies training, and DME and/or AE instructions  RECOMMENDED OTHER SERVICES: N/A  CONSULTED AND AGREED WITH PLAN OF CARE: Patient and family member/caregiver  PLAN FOR NEXT SESSION: HEP progression, neuro re-ed, therapeutic exercises   Harrel Carina, MS, OTR/L

## 2022-05-03 ENCOUNTER — Ambulatory Visit: Payer: Medicare Other

## 2022-05-03 ENCOUNTER — Encounter: Payer: Self-pay | Admitting: Speech Pathology

## 2022-05-03 ENCOUNTER — Ambulatory Visit: Payer: Medicare Other | Admitting: Speech Pathology

## 2022-05-03 ENCOUNTER — Ambulatory Visit: Payer: Medicare Other | Admitting: Occupational Therapy

## 2022-05-03 DIAGNOSIS — R482 Apraxia: Secondary | ICD-10-CM | POA: Diagnosis not present

## 2022-05-03 DIAGNOSIS — M6281 Muscle weakness (generalized): Secondary | ICD-10-CM

## 2022-05-03 DIAGNOSIS — R4701 Aphasia: Secondary | ICD-10-CM

## 2022-05-03 NOTE — Therapy (Signed)
OCCUPATIONAL THERAPY TREATMENT NOTE MRN: [id number] DOB:Mar 28, 1945, 77 y.o., male Today's Date: 03/06/2022  PCP: Threasa Alpha, PA REFERRING PROVIDER: Reesa Chew    OT End of Session - 05/03/22 1217     Visit Number 41    Number of Visits 51    Date for OT Re-Evaluation 07/12/22    OT Start Time 1015    OT Stop Time 1100    OT Time Calculation (min) 45 min    Activity Tolerance Patient tolerated treatment well    Behavior During Therapy WFL for tasks assessed/performed                        Past Medical History:  Diagnosis Date   Acid reflux    Aortic atherosclerosis ([place])    a. Noted on CT Abd in 2012.   Arthritis    hands   Carotid atherosclerosis    a. 02/2018 U/S: RICA 6-27%, LICA 03-50%; b. 0/9381 MRA Head/Neck: near complete occlusion L ICA and L MCA. Patent RICA.   Diabetes mellitus without complication (HCC)    Diastolic dysfunction    a. 09/2021 Echo: EF 55-60%, no rwma, GrI DD, nl RV fxn, RVSP 14.92mHg.   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 09/2021 MRI/MRA Brain: Acute L MCA distribution infarct. Near complete occlusion L ICA and MCA.   Tachycardia    Tinea pedis of both feet 01/10/2017   Past Surgical History:  Procedure Laterality Date   APPENDECTOMY  1973   CHOLECYSTECTOMY  2013   PROSTATECTOMY  2012   Patient Active Problem List   Diagnosis Date Noted   H/O prostatectomy 02/06/2022   Hemiparesis, aphasia, and dysphagia as late effects of stroke ([facility] [place] 11/07/2021   Lower urinary tract symptoms (LUTS) 11/07/2021   Myalgia due to statin 02/21/2021   Type 2 diabetes mellitus without complication, without long-term current use of insulin ([facility]  02/21/2021   Aortic atherosclerosis ([facility] 04/27/2020   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   Onychomycosis of multiple toenails with type 2 diabetes mellitus ([facility] 07/19/2016   Carotid stenosis 03/13/2016   Hyperlipidemia associated with type 2 diabetes mellitus ([facility]    Hypertension associated with type 2 diabetes mellitus ([facility]    Personal history of prostate cancer    Carotid atherosclerosis    GERD (gastroesophageal reflux disease) 08/20/2013    ONSET DATE: 10/05/21  REFERRING DIAG: L MCA CVA  THERAPY DIAG:  Muscle weakness (generalized)  Rationale for Evaluation and Treatment Rehabilitation  SUBJECTIVE:   SUBJECTIVE STATEMENT:   Pt.  reports that his shoulder feels better, and has no pain today.  PERTINENT HISTORY: Per chart, WSavian Mazonis a 77year old right-handed male with history of hypertension, hyperlipidemia, diabetes mellitus, prostate cancer/prostatectomy 2012, quit smoking 24 years ago.  [facility] right [facility] weakness/facial droop and expressive aphasia.  Blood pressure 155/102.  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  No associated hemorrhage or significant mass effect.    PAIN:  Are you having pain? No reports of pain today  FALLS: Has patient fallen in last 6 months? Yes.  Number of falls 3 since CVA  PLOF: Independent/retired Dealer  PATIENT GOALS : Pt points to his hand (wants to be able to use his hand)  OBJECTIVE:   HAND DOMINANCE: Right   FUNCTIONAL OUTCOME MEASURES: FOTO: 48  UUPPER EXTREMITY ROM      Active ROM Right eval Left Eval WNL Right 12/11/21 Right 01/23/2022 Right 03/15/2022 Right 04/19/2022  Shoulder flexion 90 (135)   95 (110) 95 97 sitting 87 sitting  Shoulder abduction 95 (110)   80 (90) 95 96 sitting 86 sitting  Wrist flex     54 60 62 62  Wrist ext     41 45 48 50    UPPER EXTREMITY MMT:      MMT Right eval Left Eval 5/5 Right 12/11/21 Right 03/15/22  Right 04/19/2022  Shoulder flexion 3-   -3 3- 3-  Shoulder abduction 3-   -3 3- 3-  Shoulder adduction          Shoulder extension          Shoulder internal rotation 3-        Shoulder external rotation 3-        Middle trapezius          Lower trapezius          Elbow flexion 4-   4 4+ 5  Elbow extension 4+   '5 5 5  '$ Wrist flexion 4   NT d/t pain 4 4+  Wrist extension 4-   NT d/t pain 4 4+  (Blank rows = not tested)   HAND FUNCTION: Eval:         Grip strength: Right: 14 lbs; Left: 58 lbs, Lateral pinch: Right: 7 lbs, Left: 19 lbs, and 3 point pinch: Right: 2 lbs, Left: 16 lbs 10th visit: Grip strength: Right: 16  lbs;                                Lateral pinch: Right: 9  lbs;                            3 point pinch: Right: 5 lbs 20th visit: Grip strength: Right: 20 lbs; Left: 70 lbs, Lateral pinch: Right: 10 lbs, Left: 20 lbs, and 3 point pinch: Right: 9 lbs, Left: 20 lbs  30th visit: Grip strength: Right: 18 lbs; Left: 70 lbs, Lateral pinch: Right: 12 lbs, Left: 20 lbs, and 3 point pinch: Right: 8 lbs, Left: 20 lbs  07/26/7739 Recert: Grip strength: Right: 20 lbs; Left: 70 lbs, Lateral pinch: Right: 10 lbs, Left: 20 lbs, and 3 point pinch: Right: 8 lbs, Left: 20 lbs    Eval: COORDINATION: Finger Nose Finger test: difficult/lacks precision with reaching targets 9 Hole Peg test: Right: unable sec; Left: 27 sec Able to oppose 2nd and 3rd digits to thumb on R hand   10th visit 9 hole Peg test: Right: 11 min, 44 sec   20th visit:  9 hole Peg test: Right: 3 min 6 sec   30th visit:  9 hole Peg test: Right: 1 min 45 sec   2/87/8676 Recert:  9 hole Peg test: Right: 1 min 30 sec     TODAY'S TREATMENT:   Self-care:  Pt. worked on [employer] with emphasis placed on incorporating, and engaging the right hand. Patient worked on copying a list of words by using letters on  the right side of the keyboard. Patient worked on typing each word multiple times in each row. Pt. worked on worked  on Publishing rights manager sentences from a page set vertically to the right of the computer.   PATIENT EDUCATION: Education details: Typing strategies for working at home.  Person educated: pt/daughter Education method: Explanation and Verbal cues Education comprehension: verbalized understanding  HOME EXERCISE PROGRAM: Theraputty, hand writing skills, table slides  GOALS: Goals reviewed with patient? Yes   SHORT TERM GOALS: Target date 05/31/2022       Pt will be indep to perform HEP for increasing strength and coordination throughout RUE.  Baseline: Not yet initiated; 10th: putty given but pt reports limited use, instructed in table slides and self PROM for R wrist/digits. 20th:  Pt performing exercises but requires continual upgrades and instruction 30th: Pt performing exercises but requires continual upgrades, modifications, and instruction as needed 04/19/2022: Independent with HEPs. Will continue to upgrade as indicated. 40th visit: Independent Goal status: ongoing   2.  Pt will manage clothing fasteners with extra time, using RUE as an assist  Baseline: unable; pt has elastic laces and wears elastic waisted pants; 10th: pt uses R hand as an assist to zip and button jeans with extra time; not yet tried small buttons on a shirt.  Pt continues to use elastic laces on shoes. 20th:  Improving with manipulation skills but still using elastic laces, able to button pants and shirt with increased time and effort. 30th: Pt. Is able to fasten pant fasteners, and button pants efficiently, ans independently. Goal status: met   LONG TERM GOALS: Target date:07/12/2022   Pt will increase FOTO score to 55 or better to indicate improved performance with daily tasks.  Baseline: 48; 10th: 48; 20th: score:42 30th visit: 64, 40th visit: 65 Goal status: Achieved   2.  Pt will increase R grip strength by 10 or more lbs to enable pt to hold and carry light ADL supplies without dropping.  Baseline: R grip 14#, L 58#;  10th visit: R grip 16; 20th visit: 20# 30th: 18#, Pt. Continues to have difficulty holding supplies securely. 04/19/2022: R: 20# 40th visit: Pt. Continues to have difficulty holding supplies securely Goal status: ongoing   3.  Pt will increase RUE strength to be able to engage RUE into ADLs at least 50% of the time. Baseline: Pt using L non-dominant arm to manage ADLs; 10th visit: Son reports he constantly sees pt attempt to use his R arm for daily tasks, but continues to be limited and still must use LUE for most tasks (see chart for RUE MMT) 20th:  continues to improve on strength and is working on consistency of use. 30th visit: Pt. Continues to use the right hand during daily ADL, and IADL tasks. 04/19/2022: RUE strength continues to be limited. Pt. Is in the process of having his persistent arm pain assessed by an orthopedic physician. Pt. Is consistently engaging his right hand during ADLs more than  50% of the time.  Goal status: Partially met. Goal deferred, and new goal added for Right shoulder ROM    4.  Pt will complete 9 hole peg test on the R in 1 min or less to work towards ability to use R hand to pick up small ADL supplies.  Baseline: Pt can remove a peg but can not pick one up; 10th visit: R 9 hole completion 11 min 44 sec; 20th visit:   Pt improved to 3 mins and 6 secs,  nearing goal. 30 visit: 1 min. & 45 sec. 04/19/2022: 1 min. & 30 sec. 40th visit: Pt. Continues to present with limited [facility] skill, and difficulty manipulating small objects. Goal status: Ongoing   5.  Pt will improve right pinch  strength to be able to open a tab on a can of soup with modified independence.  Baseline: Right lateral pinch 10#, 3pt. Pinch 8#. Pt. is unable to open, and pull the tab on a can of soup. 40th visit: Pt.  Has difficulty opening, and pulling the tab on a can of soup Goal status: Ongoing  6. Pt. will increase right shoulder ROM by 10 degrees to improve functional reach during ADLs, and  IADL. Baseline: Right shoulder AROM flexion in sitting; 87, Abduction: 86; 40th visit: Right shoulder flexion in limited. New diagnosis for frozen shoulder, received an injection yesterday. Pt. reports having a good response with decreased pain following. Goal status: Ongoing  ASSESSMENT:  CLINICAL IMPRESSION:  Pt. required fewer cues to engage his right hand when typing words, and requires increased time to complete. Pt. continues to require increased time, and verbal cues during typing tasks. Patient is presenting with increased isolated digit movement when typing the letters with each digit extended. Pt. continues to make multiple errors, and mistypes, and requires cues to navigate the keyboard to initiate correcting the mistypes. Pt. continues to work on increasing right shoulder ROM, strength and coordination in the right hand to improve overall RUE functioning for increased independence with ADLs, and IADL tasks.   PERFORMANCE DEFICITS in functional skills including ADLs, IADLs, coordination, dexterity, ROM, strength, pain, flexibility, FMC, GMC, mobility, balance, continence, decreased knowledge of use of DME, and UE functional use, cognitive skills including safety awareness, and psychosocial skills including.   IMPAIRMENTS are limiting patient from ADLs, IADLs, leisure, and social participation.   COMORBIDITIES may have co-morbidities  that affects occupational performance. Patient will benefit from skilled OT to address above impairments and improve overall function.  MODIFICATION OR ASSISTANCE TO COMPLETE EVALUATION: Min-Moderate modification of tasks or assist with assess necessary to complete an evaluation.  OT OCCUPATIONAL PROFILE AND HISTORY: Problem focused assessment: Including review of records relating to presenting problem.  CLINICAL DECISION MAKING: Moderate - several treatment options, min-mod task modification necessary  REHAB POTENTIAL: Good  EVALUATION COMPLEXITY:  Moderate    PLAN: OT FREQUENCY: 2x/week  OT DURATION: 12 weeks  PLANNED INTERVENTIONS: self care/ADL training, therapeutic exercise, therapeutic activity, neuromuscular re-education, manual therapy, passive range of motion, balance training, functional mobility training, moist heat, cryotherapy, patient/family education, cognitive remediation/compensation, energy conservation, coping strategies training, and DME and/or AE instructions  RECOMMENDED OTHER SERVICES: N/A  CONSULTED AND AGREED WITH PLAN OF CARE: Patient and family member/caregiver  PLAN FOR NEXT SESSION: HEP progression, neuro re-ed, therapeutic exercises   Harrel Carina, MS, OTR/L

## 2022-05-03 NOTE — Therapy (Signed)
OUTPATIENT SPEECH LANGUAGE PATHOLOGY TREATMENT NOTE   Patient Name: Gerald Bauer MRN: [id number] DOB:[birth year], 77 y.o., male Today's Date: 05/03/2022  PCP: [name], PA-C Referring Provider:  [name], PA-C  END OF SESSION:   End of Session - 05/03/22 1303     Visit Number 42    Number of Visits 23    Date for SLP Re-Evaluation 07/24/22    SLP Start Time 0900    SLP Stop Time  1000    SLP Time Calculation (min) 60 min    Activity Tolerance Patient tolerated treatment well             Past Medical History:  Diagnosis Date   Acid reflux    Aortic atherosclerosis ([place])    a. Noted on CT Abd in 2012.   Arthritis    hands   Carotid atherosclerosis    a. 02/2018 U/S: RICA 123456, LICA 123456; b. [id number] MRA Head/Neck: near complete occlusion L ICA and L MCA. Patent RICA.   Diabetes mellitus without complication (HCC)    Diastolic dysfunction    a. 09/2021 Echo: EF 55-60%, no rwma, GrI DD, nl RV fxn, RVSP 14.37mHg.   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 09/2021 MRI/MRA Brain: Acute L MCA distribution infarct. Near complete occlusion L ICA and MCA.   Tachycardia    Tinea pedis of both feet 01/10/2017   Past Surgical History:  Procedure Laterality Date   APPENDECTOMY  1973   CHOLECYSTECTOMY  2013   PROSTATECTOMY  2012   Patient Active Problem List   Diagnosis Date Noted   H/O prostatectomy 02/06/2022   Hemiparesis, aphasia, and dysphagia as late effects of stroke ([place] 11/07/2021   Lower urinary tract symptoms (LUTS) 11/07/2021   Myalgia due to statin 02/21/2021   Type 2 diabetes mellitus without complication, without long-term current use of insulin ([place]  02/21/2021   Aortic atherosclerosis ([place] 04/27/2020   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   Onychomycosis of multiple toenails with type 2 diabetes mellitus ([facility] 07/19/2016   Carotid stenosis 03/13/2016   Hyperlipidemia associated with type 2 diabetes mellitus ([facility]    Hypertension associated with type 2 diabetes mellitus ([facility]    Personal history of prostate cancer    Carotid atherosclerosis    GERD (gastroesophageal reflux disease) 08/20/2013    ONSET DATE: 10/05/2021    REFERRING DIAG: Cerebral infarction due to unspecified occlusion of left middle cerebral artery   THERAPY DIAG:  Verbal apraxia Verbal apraxia   Rationale for Evaluation and Treatment Rehabilitation   SUBJECTIVE STATEMENT:           Pt states he is going to rest this weekend. Pt accompanied by: daughter-in-law   PERTINENT HISTORY: Pt  is a 89year old male with history of left-sided carotid stenosis, non-insulin-dependent diabetes mellitus type 2, PAD, GERD, mild COPD, history of tobacco use, hyperlipidemia, hypertension, [facility]op, expressive aphasia, and R sided weakness and found to have left MCA CVA. [facility] Advanced to dysphagia 3/thin liquids by cup by time of d/c from CIR. 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 Difficult to fully assess cognition given severity of linguistic impairment; however, does present with decreased safety awareness, diminished problem-solving, emergent awareness, and mild impulsivity with movement."     DIAGNOSTIC FINDINGS: MBS 10/19/21: "Pt presents with overall mild oropharyngeal dysphagia. Oral phase c/b piecemeal deglutition, decreased mastication, decreased bolus cohesion, premature spillage, and weak lingual manipulation. Pharyngeal phase c/b reduced pharyngeal peristalsis + reduced BOT approximation to PPW, which  results in mild to moderate vallecular and pyriform sinuses residuals (vallecula > pyriform sinuses for solids, pyriform sinuses > vallecula for liquids). No penetration nor aspiration appreciated with thin liquid via cup, Dysphagia 1 (puree), Dysphagia 2 (chopped), Dysphagia 3 (mechanical soft), or with 13 mm Barium tablet placed in puree. Once instance of sensed aspiration with intake of large bolus of thin liquid via straw due to swallow initiation delay to pyriform sinuses and mistiming of swallow-breath cycle; despite strong reflexive cough aspirant was not fully cleared from trachea. Pt's level of swallow initiation was noted to vary with liquid consumption (over base of epiglottis and at pyriform sinuses) with level of swallow initiation for solids to be consistent at the vallecula. Pharyngeal stasis was partially cleared with reflexive secondary swallow." MRI brain 10/05/21: 1. 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 No associated hemorrhage or significant regional mass effect. 2. Loss of normal flow voids throughout the left ICA and MCA, consistent with slow flow and/or occlusion. 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 3. Underlying mild chronic microvascular ischemic disease. Tiny remote left ACA distribution infarct.   PAIN:  Are you having pain? No   PATIENT GOALS talk better   OBJECTIVE:    TODAY'S TREATMENT:  Targeted intelligibility, communication strategies, and initiation of repairs with structured multiparagraph level reading task and sentence generation tasks. While reading aloud an article of interest, patient utilized tapping stick and moving his finger under the text as a guide to slow down words. Student clinician highlighted unintelligible words as he read the article. Min-mod cues given to slow down, increase volume, take a break, and to allow student clinician to model difficult words before  attempting to say them again. Intelligibility to less familiar listener Tax adviser) was 80-90% with repairs. Patient initiated repairs of some words and given min-mod cues to repair other words. Intelligibility accuracy increased with patient increasing volume, using accurate amount of taps, and taking breaks. Patient generated 4 sentences using 5 two-syllable words and 5 sentences using multi-syllabic words (min cues to use tapping stick for both). Probation officer to provide feedback and generate patient awareness of communication breakdowns.  PATIENT EDUCATION: Education details: using strategies for intelligible speech, taking breaks when feeling fatigued  Person educated: Patient  Education method: Explanation Education comprehension: verbalized understanding and needs further education     GOALS: Goals reviewed with patient? Yes   SHORT TERM GOALS: Target date: 10 sessions   Patient will participate in clinical assessment of swallow function with goals added as needed. Baseline: Goal status: MET   2.  Pt will communicate emergency information 100% accuracy using visual aid if necessary.  Baseline:  Goal status: MET   3.  Pt will approximate personally relevant words and phrases >80% accuracy using script training and min visual cues for apraxia. Baseline:  Goal status: MET   4.  Pt will generate at least 4 descriptors of target word 80% of the time  using semantic feature analysis to improve abilities in wordfinding and resolving communication breakdowns.  Baseline:  Goal status: MET   5.  Pt will complete HEP for dysphagia with rare min A. Baseline:  Goal status: MET   6.  Pt will generate sentences using personally relevant words list for >80% intelligibility. Baseline: 50% intelligibility Goal status: MET   7.  Pt will improve expressive language skills by taking 4-6 turns in simple conversation, with supported conversation strategies if  needed. Baseline:  Goal status: MET 8.  Pt will generate sentences moderately complex sentences 8-10 words >80% intelligibility, with self-correction/multimodal communication allowed. Baseline: 50% intelligibility Goal status: MET   9.  Patient will use intelligibility strategies and script training for 4-6 turn telephone exchange with family member or friend. Baseline:  Goal status: MET  10.  Pt will demonstrate operational competence using communication device by independently powering on/off his device, navigating forward/back and selecting icons. Baseline:  Goal status: DEFERRED   11.  Pt will answer simple questions using AAC device to provide information re: emotional state, pain, and preferences. Baseline:  Goal status: DEFERRED   12.  Pt will use AAC in social exchange of 2-4 turns to share and request information.  Baseline:  Goal status: DEFERRED  13.  Pt will achieve 80% intelligibility in 6-8 turn exchange with less familiar listener or unknown context. Baseline: by visit 44 Goal status: ONGOING  14.  Pt will monitor for signs of listener comprehension at sentence level and initiate repair when breakdowns occur in 80% of opportunities. Baseline: by visit 44 Goal status: ONGOING        LONG TERM GOALS: Target date: 07/24/2022   Pt will engage in 10-12 minutes simple-mod complex conversation re: topic of interest with supported conversation, aphasia compensations.  Baseline: 01/25/22: 5 turn exchange. Goal renewed 01/25/22, achieved 5-8 min 04/26/22 and goal revised.  Goal status: REVISED   2.  Pt will ID and attempt repair of communication breakdowns >90% of the time for less familiar listener.    Baseline: 01/25/22: requires occasional mod cues to initiate correction; renewed 01/25/22, achieved 04/26/22 and revised for less-familiar listener Goal status: REVISED   3.  Pt/family will demonstrate knowledge of community resources and activities to support  language/communication. Baseline: 01/25/22: referral completed to [facility]; pt has yet to register for group, renewed 01/25/22 Goal status: ACHIEVED   4.  Pt will demonstrate use of swallowing precautions independently with s/sx aspiration <5% of trials. Baseline: 01/25/22: no overt s/sx when sipping water in sessions, pt and family report no issues at home with meals Goal status: DEFERRED       ASSESSMENT:   CLINICAL IMPRESSION: Patient continues with aphasia and verbal apraxia, with improvement in aphasia severity (mild) noted compared with initial evaluation (moderate). 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 Intelligibility is impacted significantly at the sentence level, but has improved with the usage of a tapping stick which helps him focus on slowing down his rate of speech. Patient's usage of gestures and rephrasing sentences has increased. He has used some scripts to improve intelligibility for short interactions with family/community, however pt still remains limited in his ability to convey information quickly and outside of highly structured interactions. For this reason, have discussed benefits and potential for using a communication device to supplement and augment pt's verbal communication; pt was initially interested in pursuing this but has opted to continue with focus on verbal communication.  I recommend skilled ST to improve pt's language and motor speech skills to improve communication and reduce frustration.    OBJECTIVE IMPAIRMENTS include expressive language, receptive language, aphasia, apraxia, dysarthria, and dysphagia. These impairments are limiting patient from managing medications, managing appointments, managing finances, household responsibilities, ADLs/IADLs, effectively communicating at home and in community, and safety when swallowing. Factors affecting potential to achieve goals and functional  outcome are cooperation/participation level; pt appears highly motivated with good family support. Patient will benefit from skilled SLP services to address above impairments and improve overall function.   REHAB POTENTIAL: Excellent   PLAN: SLP FREQUENCY: 2x/week   SLP DURATION: 12 weeks   PLANNED INTERVENTIONS: Aspiration precaution training, Pharyngeal strengthening exercises, Diet toleration management , Language facilitation, Environmental controls, Trials of upgraded texture/liquids, Cognitive reorganization, Internal/external aids, Functional tasks, Multimodal communication approach, SLP instruction and feedback, Compensatory strategies, and Patient/family education  [name], MS, CCC-SLP Speech-Language Pathologist [phone]   Kebra Lowrimore Planter, [place] 05/03/2022, 1:05 PM  [place] at [facility] [place], [place], [address] Phone: [phone]   Fax:  [phone]

## 2022-05-04 ENCOUNTER — Other Ambulatory Visit: Payer: Self-pay | Admitting: Family Medicine

## 2022-05-04 DIAGNOSIS — E1165 Type 2 diabetes mellitus with hyperglycemia: Secondary | ICD-10-CM

## 2022-05-07 LAB — HEMOGLOBIN A1C: Hemoglobin A1C: 6.4

## 2022-05-07 LAB — HM DIABETES FOOT EXAM: HM Diabetic Foot Exam: NORMAL

## 2022-05-07 NOTE — Telephone Encounter (Signed)
Requested Prescriptions  Pending Prescriptions Disp Refills   Lancets (ONETOUCH DELICA PLUS [id number]) [place] [Pharmacy Med Name: OneTouch Delica Plus [id number] 100 each 0    Sig: USE TO [place] BLOOD GLUCOSE ONCE  DAILY AS DIRECTED     Endocrinology: Diabetes - Testing Supplies Passed - 05/04/2022 10:59 PM      Passed - Valid encounter within last 12 months    Recent Outpatient Visits           2 months ago Hyperlipidemia associated with type 2 diabetes mellitus [facility])   [facility] [name], PA-C   6 months ago Encounter for examination following treatment at [facility] [name], PA-C   7 months ago Skin cancer screening   [facility] Mecum, Dani Gobble, PA-C   1 year ago Type 2 diabetes mellitus without complication, without long-term current use of insulin [facility])   [facility] [name], PA-C   1 year ago Type 2 diabetes mellitus without complication, without long-term current use of insulin [facility])   [facility] [name], PA-C       Future Appointments             In 2 months Gollan, Kathlene November, MD [place] [facility]   In 3 months [name], [facility] [place] test strip [place] Med Name: OneTouch Verio In Vitro Strip] 100 strip 0    Sig: CHECK FINGERSTICK BLOOD SUGARS  ONCE DAILY     Endocrinology: Diabetes - Testing Supplies Passed - 05/04/2022 10:59 PM      Passed - Valid encounter within last 12 months    Recent Outpatient Visits           2 months ago Hyperlipidemia associated with type 2 diabetes mellitus [facility])   [facility] [name], PA-C   6 months ago Encounter for examination following treatment at [facility] [name], PA-C   7 months ago Skin cancer screening    [facility] Mecum, Erin E, PA-C   1 year ago Type 2 diabetes mellitus without complication, without long-term current use of insulin [facility])   [facility] [name], PA-C   1 year ago Type 2 diabetes mellitus without complication, without long-term current use of insulin [facility])   [facility] [name], PA-C       Future Appointments             In 2 months Gollan, Kathlene November, MD [place] [facility]   In 3 months [name], [facility]

## 2022-05-08 ENCOUNTER — Ambulatory Visit: Payer: Medicare Other | Admitting: Occupational Therapy

## 2022-05-08 ENCOUNTER — Ambulatory Visit: Payer: Medicare Other

## 2022-05-08 ENCOUNTER — Ambulatory Visit: Payer: Medicare Other | Admitting: Speech Pathology

## 2022-05-08 ENCOUNTER — Encounter: Payer: Self-pay | Admitting: Occupational Therapy

## 2022-05-08 DIAGNOSIS — R482 Apraxia: Secondary | ICD-10-CM | POA: Diagnosis not present

## 2022-05-08 DIAGNOSIS — M6281 Muscle weakness (generalized): Secondary | ICD-10-CM

## 2022-05-08 NOTE — Therapy (Signed)
OCCUPATIONAL THERAPY TREATMENT NOTE MRN: [id number] DOB:04-May-1945, 77 y.o., male Today's Date: 03/06/2022  PCP: Threasa Alpha, PA REFERRING PROVIDER: Reesa Chew    OT End of Session - 05/08/22 1216     Visit Number 42    Number of Visits 67    Date for OT Re-Evaluation 07/12/22    OT Start Time 1018    OT Stop Time 1100    OT Time Calculation (min) 42 min    Activity Tolerance Patient tolerated treatment well    Behavior During Therapy WFL for tasks assessed/performed                        Past Medical History:  Diagnosis Date   Acid reflux    Aortic atherosclerosis ([place])    a. Noted on CT Abd in 2012.   Arthritis    hands   Carotid atherosclerosis    a. 02/2018 U/S: RICA 123456, LICA 123456; b. [id number] MRA Head/Neck: near complete occlusion L ICA and L MCA. Patent RICA.   Diabetes mellitus without complication (HCC)    Diastolic dysfunction    a. 09/2021 Echo: EF 55-60%, no rwma, GrI DD, nl RV fxn, RVSP 14.47mHg.   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 09/2021 MRI/MRA Brain: Acute L MCA distribution infarct. Near complete occlusion L ICA and MCA.   Tachycardia    Tinea pedis of both feet 01/10/2017   Past Surgical History:  Procedure Laterality Date   APPENDECTOMY  1973   CHOLECYSTECTOMY  2013   PROSTATECTOMY  2012   Patient Active Problem List   Diagnosis Date Noted   H/O prostatectomy 02/06/2022   Hemiparesis, aphasia, and dysphagia as late effects of stroke ([facility] 11/07/2021   Lower urinary tract symptoms (LUTS) 11/07/2021   Myalgia due to statin 02/21/2021   Type 2 diabetes mellitus without complication, without long-term current use of insulin ([facility]  02/21/2021   Aortic atherosclerosis ([facility] 04/27/2020   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   Onychomycosis of multiple toenails with type 2 diabetes mellitus ([facility] 07/19/2016   Carotid stenosis 03/13/2016   Hyperlipidemia associated with type 2 diabetes mellitus ([facility]    Hypertension associated with type 2 diabetes mellitus ([facility]    Personal history of prostate cancer    Carotid atherosclerosis    GERD (gastroesophageal reflux disease) 08/20/2013    ONSET DATE: 10/05/21  REFERRING DIAG: L MCA CVA  THERAPY DIAG:  Muscle weakness (generalized)  Rationale for Evaluation and Treatment Rehabilitation  SUBJECTIVE:   SUBJECTIVE STATEMENT:   Pt.  reports being tired today.  PERTINENT HISTORY: Per chart, Gerald Bauer a 77year old right-handed male with history of hypertension, hyperlipidemia, diabetes mellitus, prostate cancer/prostatectomy 2012, quit smoking 24 years ago.  [facility] weakness/facial droop and expressive aphasia.  Blood pressure 155/102.  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  No associated hemorrhage or significant mass effect.    PAIN:  Are you having pain? No reports of pain today  FALLS: Has patient fallen in last 6 months? Yes. Number of falls 3 since CVA  PLOF: Independent/retired Dealer  PATIENT GOALS : Pt points to his hand (wants to be able to use his hand)  OBJECTIVE:   HAND DOMINANCE: Right   FUNCTIONAL OUTCOME MEASURES: FOTO: 48  UUPPER EXTREMITY ROM      Active ROM Right eval Left Eval WNL Right 12/11/21 Right 01/23/2022 Right 03/15/2022 Right 04/19/2022  Shoulder flexion 90 (135)   95 (110) 95 97 sitting 87 sitting  Shoulder abduction 95 (110)   80 (90) 95 96 sitting 86 sitting  Wrist flex     54 60 62 62  Wrist ext     41 45 48 50    UPPER EXTREMITY MMT:      MMT Right eval Left Eval 5/5 Right 12/11/21 Right 03/15/22 Right 04/19/2022  Shoulder flexion  3-   -3 3- 3-  Shoulder abduction 3-   -3 3- 3-  Shoulder adduction          Shoulder extension          Shoulder internal rotation 3-        Shoulder external rotation 3-        Middle trapezius          Lower trapezius          Elbow flexion 4-   4 4+ 5  Elbow extension 4+   5 5 5  $ Wrist flexion 4   NT d/t pain 4 4+  Wrist extension 4-   NT d/t pain 4 4+  (Blank rows = not tested)   HAND FUNCTION: Eval:         Grip strength: Right: 14 lbs; Left: 58 lbs, Lateral pinch: Right: 7 lbs, Left: 19 lbs, and 3 point pinch: Right: 2 lbs, Left: 16 lbs 10th visit: Grip strength: Right: 16  lbs;                                Lateral pinch: Right: 9  lbs;                            3 point pinch: Right: 5 lbs 20th visit: Grip strength: Right: 20 lbs; Left: 70 lbs, Lateral pinch: Right: 10 lbs, Left: 20 lbs, and 3 point pinch: Right: 9 lbs, Left: 20 lbs  30th visit: Grip strength: Right: 18 lbs; Left: 70 lbs, Lateral pinch: Right: 12 lbs, Left: 20 lbs, and 3 point pinch: Right: 8 lbs, Left: 20 lbs  [id number] Recert: Grip strength: Right: 20 lbs; Left: 70 lbs, Lateral pinch: Right: 10 lbs, Left: 20 lbs, and 3 point pinch: Right: 8 lbs, Left: 20 lbs    Eval: COORDINATION: Finger Nose Finger test: difficult/lacks precision with reaching targets 9 Hole Peg test: Right: unable sec; Left: 27 sec Able to oppose 2nd and 3rd digits to thumb on R hand   10th visit 9 hole Peg test: Right: 11 min, 44 sec   20th visit:  9 hole Peg test: Right: 3 min 6 sec   30th visit:  9 hole Peg test: Right: 1 min 45 sec   [id number] Recert:  9 hole Peg test: Right: 1 min 30 sec     TODAY'S TREATMENT:   Self-care:  Pt. worked on [employer] with emphasis placed on incorporating, and engaging the right hand. Pt. worked on worked on Publishing rights manager sentences from a page set flat at the tabletop surface to the right  of the computer. Pt. Completed a 2 min. Typing test 2 wpm with 41% accuracy.  Pt. DIL reports that Pt. is more  tired today than usual. Pt. required increased cues to engage his right hand when typing words of often reaching to the right side of the keyboard for the keys with the left hand. Pt. was able to initiate self-correcting this 1x, however required extensive cues to use the right hand.  Pt. continues to require increased time, and verbal cues during typing tasks. Patient is presenting with increased isolated digit movement when typing the letters with each digit extended. Pt. continues to make multiple errors, and mistypes, and requires cues to navigate the keyboard to initiate correcting the mistypes. Pt. continues to work on increasing right shoulder ROM, strength and coordination in the right hand to improve overall RUE functioning for increased independence with ADLs, and IADL tasks.     PATIENT EDUCATION: Education details: Typing strategies for working at home.  Person educated: pt/daughter Education method: Explanation and Verbal cues Education comprehension: verbalized understanding  HOME EXERCISE PROGRAM: Theraputty, hand writing skills, table slides  GOALS: Goals reviewed with patient? Yes   SHORT TERM GOALS: Target date 05/31/2022       Pt will be indep to perform HEP for increasing strength and coordination throughout RUE.  Baseline: Not yet initiated; 10th: putty given but pt reports limited use, instructed in table slides and self PROM for R wrist/digits. 20th:  Pt performing exercises but requires continual upgrades and instruction 30th: Pt performing exercises but requires continual upgrades, modifications, and instruction as needed 04/19/2022: Independent with HEPs. Will continue to upgrade as indicated. 40th visit: Independent Goal status: ongoing   2.  Pt will manage clothing fasteners with extra time, using RUE as an assist  Baseline: unable; pt has elastic laces and wears elastic waisted pants; 10th: pt uses R hand as an assist to zip and button jeans with extra time; not yet  tried small buttons on a shirt.  Pt continues to use elastic laces on shoes. 20th:  Improving with manipulation skills but still using elastic laces, able to button pants and shirt with increased time and effort. 30th: Pt. Is able to fasten pant fasteners, and button pants efficiently, ans independently. Goal status: met   LONG TERM GOALS: Target date:07/12/2022   Pt will increase FOTO score to 55 or better to indicate improved performance with daily tasks.  Baseline: 48; 10th: 48; 20th: score:42 30th visit: 64, 40th visit: 65 Goal status: Achieved   2.  Pt will increase R grip strength by 10 or more lbs to enable pt to hold and carry light ADL supplies without dropping.  Baseline: R grip 14#, L 58#; 10th visit: R grip 16; 20th visit: 20# 30th: 18#, Pt. Continues to have difficulty holding supplies securely. 04/19/2022: R: 20# 40th visit: Pt. Continues to have difficulty holding supplies securely Goal status: ongoing   3.  Pt will increase RUE strength to be able to engage RUE into ADLs at least 50% of the time. Baseline: Pt using L non-dominant arm to manage ADLs; 10th visit: Son reports he constantly sees pt attempt to use his R arm for daily tasks, but continues to be limited and still must use LUE for most tasks (see chart for RUE MMT) 20th:  continues to improve on strength and is working on consistency of use. 30th visit: Pt. Continues to use the right hand during daily ADL, and IADL tasks. 04/19/2022: RUE strength continues to  be limited. Pt. Is in the process of having his persistent arm pain assessed by an orthopedic physician. Pt. Is consistently engaging his right hand during ADLs more than  50% of the time.  Goal status: Partially met. Goal deferred, and new goal added for Right shoulder ROM    4.  Pt will complete 9 hole peg test on the R in 1 min or less to work towards ability to use R hand to pick up small ADL supplies.  Baseline: Pt can remove a peg but can not pick one up; 10th  visit: R 9 hole completion 11 min 44 sec; 20th visit:   Pt improved to 3 mins and 6 secs, nearing goal. 30 visit: 1 min. & 45 sec. 04/19/2022: 1 min. & 30 sec. 40th visit: Pt. Continues to present with limited [facility] skill, and difficulty manipulating small objects. Goal status: Ongoing   5.  Pt will improve right pinch  strength to be able to open a tab on a can of soup with modified independence.  Baseline: Right lateral pinch 10#, 3pt. Pinch 8#. Pt. is unable to open, and pull the tab on a can of soup. 40th visit: Pt.  Has difficulty opening, and pulling the tab on a can of soup Goal status: Ongoing  6. Pt. will increase right shoulder ROM by 10 degrees to improve functional reach during ADLs, and IADL. Baseline: Right shoulder AROM flexion in sitting; 87, Abduction: 86; 40th visit: Right shoulder flexion in limited. New diagnosis for frozen shoulder, received an injection yesterday. Pt. reports having a good response with decreased pain following. Goal status: Ongoing  ASSESSMENT:  CLINICAL IMPRESSION:  Pt. DIL reports that Pt. is more tired today than usual. Pt. required increased cues to engage his right hand when typing words of often reaching to the right side of the keyboard for the keys with the left hand. Pt. was able to initiate self-correcting this 1x, however required extensive cues to use the right hand.  Pt. continues to require increased time, and verbal cues during typing tasks. Patient is presenting with increased isolated digit movement when typing the letters with each digit extended. Pt. continues to make multiple errors, and mistypes, and requires cues to navigate the keyboard to initiate correcting the mistypes. Pt. continues to work on increasing right shoulder ROM, strength and coordination in the right hand to improve overall RUE functioning for increased independence with ADLs, and IADL tasks.   PERFORMANCE DEFICITS in functional skills including ADLs, IADLs, coordination,  dexterity, ROM, strength, pain, flexibility, FMC, GMC, mobility, balance, continence, decreased knowledge of use of DME, and UE functional use, cognitive skills including safety awareness, and psychosocial skills including.   IMPAIRMENTS are limiting patient from ADLs, IADLs, leisure, and social participation.   COMORBIDITIES may have co-morbidities  that affects occupational performance. Patient will benefit from skilled OT to address above impairments and improve overall function.  MODIFICATION OR ASSISTANCE TO COMPLETE EVALUATION: Min-Moderate modification of tasks or assist with assess necessary to complete an evaluation.  OT OCCUPATIONAL PROFILE AND HISTORY: Problem focused assessment: Including review of records relating to presenting problem.  CLINICAL DECISION MAKING: Moderate - several treatment options, min-mod task modification necessary  REHAB POTENTIAL: Good  EVALUATION COMPLEXITY: Moderate    PLAN: OT FREQUENCY: 2x/week  OT DURATION: 12 weeks  PLANNED INTERVENTIONS: self care/ADL training, therapeutic exercise, therapeutic activity, neuromuscular re-education, manual therapy, passive range of motion, balance training, functional mobility training, moist heat, cryotherapy, patient/family education, cognitive remediation/compensation, energy conservation,  coping strategies training, and DME and/or AE instructions  RECOMMENDED OTHER SERVICES: N/A  CONSULTED AND AGREED WITH PLAN OF CARE: Patient and family member/caregiver  PLAN FOR NEXT SESSION: HEP progression, neuro re-ed, therapeutic exercises   Harrel Carina, MS, OTR/L

## 2022-05-08 NOTE — Therapy (Signed)
OUTPATIENT SPEECH LANGUAGE PATHOLOGY TREATMENT NOTE   Patient Name: Gerald Bauer MRN: [id number] DOB:[birth year], 77 y.o., male Today's Date: 05/03/2022  PCP: [name], PA-C Referring Provider:  [name], PA-C  END OF SESSION:   End of Session - 05/03/22 1303     Visit Number [id number]    Number of Visits 23    Date for SLP Re-Evaluation 07/24/22    SLP Start Time 0900    SLP Stop Time  1000    SLP Time Calculation (min) 60 min    Activity Tolerance Patient tolerated treatment well             Past Medical History:  Diagnosis Date   Acid reflux    Aortic atherosclerosis ([place])    a. Noted on CT Abd in 2012.   Arthritis    hands   Carotid atherosclerosis    a. 02/2018 U/S: RICA 123456, LICA 123456; b. [id number] MRA Head/Neck: near complete occlusion L ICA and L MCA. Patent RICA.   Diabetes mellitus without complication (HCC)    Diastolic dysfunction    a. 09/2021 Echo: EF 55-60%, no rwma, GrI DD, nl RV fxn, RVSP 14.37mHg.   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 09/2021 MRI/MRA Brain: Acute L MCA distribution infarct. Near complete occlusion L ICA and MCA.   Tachycardia    Tinea pedis of both feet 01/10/2017   Past Surgical History:  Procedure Laterality Date   APPENDECTOMY  1973   CHOLECYSTECTOMY  2013   PROSTATECTOMY  2012   Patient Active Problem List   Diagnosis Date Noted   H/O prostatectomy 02/06/2022   Hemiparesis, aphasia, and dysphagia as late effects of stroke ([place] 11/07/2021   Lower urinary tract symptoms (LUTS) 11/07/2021   Myalgia due to statin 02/21/2021   Type 2 diabetes mellitus without complication, without long-term current use of insulin ([facility]  02/21/2021   Aortic atherosclerosis ([facility] 04/27/2020   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   Onychomycosis of multiple toenails with type 2 diabetes mellitus ([facility] 07/19/2016   Carotid stenosis 03/13/2016   Hyperlipidemia associated with type 2 diabetes mellitus ([facility]    Hypertension associated with type 2 diabetes mellitus ([facility]    Personal history of prostate cancer    Carotid atherosclerosis    GERD (gastroesophageal reflux disease) 08/20/2013    ONSET DATE: 10/05/2021    REFERRING DIAG: Cerebral infarction due to unspecified occlusion of left middle cerebral artery   THERAPY DIAG:  Verbal apraxia Verbal apraxia   Rationale for Evaluation and Treatment Rehabilitation   SUBJECTIVE STATEMENT:           Pt states he is going to rest this weekend. Pt accompanied by: daughter-in-law   PERTINENT HISTORY: Pt  is a 89year old male with history of left-sided carotid stenosis, non-insulin-dependent diabetes mellitus type 2, PAD, GERD, mild COPD, history of tobacco use, hyperlipidemia, hypertension, [facility]op, expressive aphasia, and R sided weakness and found to have left MCA CVA. [facility] Advanced to dysphagia 3/thin liquids by cup by time of d/c from CIR. 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 Difficult to fully assess cognition given severity of linguistic impairment; however, does present with decreased safety awareness, diminished problem-solving, emergent awareness, and mild impulsivity with movement."     DIAGNOSTIC FINDINGS: MBS 10/19/21: "Pt presents with overall mild oropharyngeal dysphagia. Oral phase c/b piecemeal deglutition, decreased mastication, decreased bolus cohesion, premature spillage, and weak lingual manipulation. Pharyngeal phase c/b reduced pharyngeal peristalsis + reduced BOT approximation to PPW, which  results in mild to moderate vallecular and pyriform sinuses residuals (vallecula > pyriform sinuses for solids, pyriform sinuses > vallecula for liquids). No penetration nor aspiration appreciated with thin liquid via cup, Dysphagia 1 (puree), Dysphagia 2 (chopped), Dysphagia 3 (mechanical soft), or with 13 mm Barium tablet placed in puree. Once instance of sensed aspiration with intake of large bolus of thin liquid via straw due to swallow initiation delay to pyriform sinuses and mistiming of swallow-breath cycle; despite strong reflexive cough aspirant was not fully cleared from trachea. Pt's level of swallow initiation was noted to vary with liquid consumption (over base of epiglottis and at pyriform sinuses) with level of swallow initiation for solids to be consistent at the vallecula. Pharyngeal stasis was partially cleared with reflexive secondary swallow." MRI brain 10/05/21: 1. 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 No associated hemorrhage or significant regional mass effect. 2. Loss of normal flow voids throughout the left ICA and MCA, consistent with slow flow and/or occlusion. 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 3. Underlying mild chronic microvascular ischemic disease. Tiny remote left ACA distribution infarct.   PAIN:  Are you having pain? No   PATIENT GOALS talk better   OBJECTIVE:    TODAY'S TREATMENT:  Pt seen for skilled ST intervention targeting goals for communicative effectiveness, including intelligibility and communication strategies. SLP facilitated session by engaging patient in conversation about a day running errands. Pt forgot to bring his tapping stick, so SLP fashioned one out of blank paper. This seemed to work better than pt tapping on the table. Pt required mod verbal cues to name places he goes when running errands ([employer], [employer]), including several restaurants he enjoys.  During this task, pt was noted to become frustrated. Pt was encouraged to feel ok about asking for a break when tired or frustrated. Pt continued to require mod cues to use strategies reviewed last session, including decrease rate, increase loudness, take a break, and allow listener to model the difficult word. Pt engaged in activity to describe his model of [place] with trains, businesses, and shops. Frequently, his daughter in law was able to identify what he was trying to say, due to increased familiarity. She indicated there was a restaurant pt likes when the server knows him and is patient with his communicative attempts. Pt seemed to be in good spirits today.     PATIENT EDUCATION: Education details: using strategies for intelligible speech, taking breaks when feeling fatigued  Person educated: Patient  Education method: Explanation Education comprehension: verbalized understanding and needs further education     GOALS: Goals reviewed with patient? Yes   SHORT TERM GOALS: Target date: 10 sessions   Patient will participate in clinical assessment of swallow function with goals added as needed. Baseline: Goal status: MET   2.  Pt will communicate emergency information 100% accuracy using visual aid if necessary.  Baseline:  Goal status: MET   3.  Pt will approximate  personally relevant words and phrases >80% accuracy using script training and min visual cues for apraxia. Baseline:  Goal status: MET   4.  Pt will generate at least 4 descriptors of target word 80% of the time using semantic feature analysis to improve abilities in wordfinding and resolving communication breakdowns.  Baseline:  Goal status: MET   5.  Pt will complete HEP for dysphagia with rare min A. Baseline:  Goal status: MET   6.  Pt will generate sentences using personally relevant words list for >80% intelligibility. Baseline: 50% intelligibility Goal status: MET   7.  Pt will improve expressive  language skills by taking 4-6 turns in simple conversation, with supported conversation strategies if needed. Baseline:  Goal status: MET 8.  Pt will generate sentences moderately complex sentences 8-10 words >80% intelligibility, with self-correction/multimodal communication allowed. Baseline: 50% intelligibility Goal status: MET   9.  Patient will use intelligibility strategies and script training for 4-6 turn telephone exchange with family member or friend. Baseline:  Goal status: MET  10.  Pt will demonstrate operational competence using communication device by independently powering on/off his device, navigating forward/back and selecting icons. Baseline:  Goal status: DEFERRED   11.  Pt will answer simple questions using AAC device to provide information re: emotional state, pain, and preferences. Baseline:  Goal status: DEFERRED   12.  Pt will use AAC in social exchange of 2-4 turns to share and request information.  Baseline:  Goal status: DEFERRED  13.  Pt will achieve 80% intelligibility in 6-8 turn exchange with less familiar listener or unknown context. Baseline: by visit 44 Goal status: ONGOING  14.  Pt will monitor for signs of listener comprehension at sentence level and initiate repair when breakdowns occur in 80% of opportunities. Baseline: by visit 44 Goal status: ONGOING        LONG TERM GOALS: Target date: 07/24/2022   Pt will engage in 10-12 minutes simple-mod complex conversation re: topic of interest with supported conversation, aphasia compensations.  Baseline: 01/25/22: 5 turn exchange. Goal renewed 01/25/22, achieved 5-8 min 04/26/22 and goal revised.  Goal status: REVISED   2.  Pt will ID and attempt repair of communication breakdowns >90% of the time for less familiar listener.    Baseline: 01/25/22: requires occasional mod cues to initiate correction; renewed 01/25/22, achieved 04/26/22 and revised for less-familiar listener Goal status: REVISED   3.   Pt/family will demonstrate knowledge of community resources and activities to support language/communication. Baseline: 01/25/22: referral completed to [facility]; pt has yet to register for group, renewed 01/25/22 Goal status: ACHIEVED   4.  Pt will demonstrate use of swallowing precautions independently with s/sx aspiration <5% of trials. Baseline: 01/25/22: no overt s/sx when sipping water in sessions, pt and family report no issues at home with meals Goal status: DEFERRED       ASSESSMENT:   CLINICAL IMPRESSION: Patient continues with aphasia and verbal apraxia, with improvement in aphasia severity (mild) noted compared with initial evaluation (moderate). 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 Intelligibility is impacted significantly at the sentence level, but has improved with the usage of a tapping stick which helps him focus on slowing down his rate of speech. Patient's usage of gestures and rephrasing sentences has increased. He has used some scripts to improve intelligibility for short interactions with family/community, however pt still remains limited in his ability to convey information quickly and outside of highly  structured interactions. For this reason, have discussed benefits and potential for using a communication device to supplement and augment pt's verbal communication; pt was initially interested in pursuing this but has opted to continue with focus on verbal communication. I recommend skilled ST to improve pt's language and motor speech skills to improve communication and reduce frustration.    OBJECTIVE IMPAIRMENTS include expressive language, receptive language, aphasia, apraxia, dysarthria, and dysphagia. These impairments are limiting patient from managing medications, managing appointments, managing finances, household responsibilities, ADLs/IADLs, effectively communicating at home and in community, and  safety when swallowing. Factors affecting potential to achieve goals and functional outcome are cooperation/participation level; pt appears highly motivated with good family support. Patient will benefit from skilled SLP services to address above impairments and improve overall function.   REHAB POTENTIAL: Excellent   PLAN: SLP FREQUENCY: 2x/week   SLP DURATION: 12 weeks   PLANNED INTERVENTIONS: Aspiration precaution training, Pharyngeal strengthening exercises, Diet toleration management , Language facilitation, Environmental controls, Trials of upgraded texture/liquids, Cognitive reorganization, Internal/external aids, Functional tasks, Multimodal communication approach, SLP instruction and feedback, Compensatory strategies, and Patient/family education  Alger Memos, [facility], CCC-SLP Speech-Language Pathologist [phone]   [place] at [facility] [place], [place], [address] Phone: [phone]   Fax:  [phone]

## 2022-05-10 ENCOUNTER — Ambulatory Visit: Payer: Medicare Other

## 2022-05-10 ENCOUNTER — Ambulatory Visit: Payer: Medicare Other | Admitting: Speech Pathology

## 2022-05-10 ENCOUNTER — Ambulatory Visit: Payer: Medicare Other | Admitting: Occupational Therapy

## 2022-05-10 ENCOUNTER — Encounter: Payer: Self-pay | Admitting: Speech Pathology

## 2022-05-10 DIAGNOSIS — R482 Apraxia: Secondary | ICD-10-CM

## 2022-05-10 DIAGNOSIS — M6281 Muscle weakness (generalized): Secondary | ICD-10-CM

## 2022-05-10 DIAGNOSIS — R4701 Aphasia: Secondary | ICD-10-CM

## 2022-05-10 DIAGNOSIS — R278 Other lack of coordination: Secondary | ICD-10-CM

## 2022-05-10 NOTE — Therapy (Signed)
OUTPATIENT SPEECH LANGUAGE PATHOLOGY TREATMENT NOTE   Patient Name: Gerald Bauer MRN: [id number] DOB:1945-07-01, 77 y.o., male Today's Date: 05/10/2022  PCP: [name], PA-C Referring Provider:  [name], PA-C  END OF SESSION:   End of Session - 05/10/22 1000     Visit Number [id number]    Number of Visits 43    Date for SLP Re-Evaluation 07/24/22    SLP Start Time 0900    SLP Stop Time  1000    SLP Time Calculation (min) 60 min    Activity Tolerance Patient tolerated treatment well             Past Medical History:  Diagnosis Date   Acid reflux    Aortic atherosclerosis ([place])    a. Noted on CT Abd in 2012.   Arthritis    hands   Carotid atherosclerosis    a. 02/2018 U/S: RICA 123456, LICA 123456; b. [id number] MRA Head/Neck: near complete occlusion L ICA and L MCA. Patent RICA.   Diabetes mellitus without complication (HCC)    Diastolic dysfunction    a. 09/2021 Echo: EF 55-60%, no rwma, GrI DD, nl RV fxn, RVSP 14.52mHg.   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 09/2021 MRI/MRA Brain: Acute L MCA distribution infarct. Near complete occlusion L ICA and MCA.   Tachycardia    Tinea pedis of both feet 01/10/2017   Past Surgical History:  Procedure Laterality Date   APPENDECTOMY  1973   CHOLECYSTECTOMY  2013   PROSTATECTOMY  2012   Patient Active Problem List   Diagnosis Date Noted   H/O prostatectomy 02/06/2022   Hemiparesis, aphasia, and dysphagia as late effects of stroke ([facility] 11/07/2021   Lower urinary tract symptoms (LUTS) 11/07/2021   Myalgia due to statin 02/21/2021   Type 2 diabetes mellitus without complication, without long-term current use of insulin ([facility]  02/21/2021   Aortic atherosclerosis ([facility] 04/27/2020   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   Onychomycosis of multiple toenails with type 2 diabetes mellitus ([facility] 07/19/2016   Carotid stenosis 03/13/2016   Hyperlipidemia associated with type 2 diabetes mellitus ([facility]    Hypertension associated with type 2 diabetes mellitus ([facility] [place]    Personal history of prostate cancer    Carotid atherosclerosis    GERD (gastroesophageal reflux disease) 08/20/2013    ONSET DATE: 10/05/2021    REFERRING DIAG: Cerebral infarction due to unspecified occlusion of left middle cerebral artery   THERAPY DIAG:  Verbal apraxia Verbal apraxia   Rationale for Evaluation and Treatment Rehabilitation   SUBJECTIVE STATEMENT:           Gerald Bauer states feeling like his intelligibility is improving.  Gerald Bauer accompanied by: self   PERTINENT HISTORY: Gerald Bauer  is a 77year old male with history of left-sided carotid stenosis, non-insulin-dependent diabetes mellitus type 2, PAD, GERD, mild COPD, history of tobacco use, hyperlipidemia, hypertension, [facility] droop, expressive aphasia, and R sided weakness and found to have left MCA CVA. [facility] Advanced to dysphagia 3/thin liquids by cup by time of d/c from CIR. 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 Difficult to fully assess cognition given severity of linguistic impairment; however, does present with decreased safety awareness, diminished problem-solving, emergent awareness, and mild impulsivity with movement."     DIAGNOSTIC FINDINGS: MBS 10/19/21: "Gerald Bauer presents with overall mild oropharyngeal dysphagia. Oral phase c/b piecemeal deglutition, decreased mastication, decreased bolus cohesion, premature spillage, and weak lingual manipulation. Pharyngeal phase c/b reduced pharyngeal peristalsis + reduced BOT approximation to PPW, which  results in mild to moderate vallecular and pyriform sinuses residuals (vallecula > pyriform sinuses for solids, pyriform sinuses > vallecula for liquids). No penetration nor aspiration appreciated with thin liquid via cup, Dysphagia 1 (puree), Dysphagia 2 (chopped), Dysphagia 3 (mechanical soft), or with 13 mm Barium tablet placed in puree. Once instance of sensed aspiration with intake of large bolus of thin liquid via straw due to swallow initiation delay to pyriform sinuses and mistiming of swallow-breath cycle; despite strong reflexive cough aspirant was not fully cleared from trachea. Gerald Bauer's level of swallow initiation was noted to vary with liquid consumption (over base of epiglottis and at pyriform sinuses) with level of swallow initiation for solids to be consistent at the vallecula. Pharyngeal stasis was partially cleared with reflexive secondary swallow." MRI brain 10/05/21: 1. 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 No associated hemorrhage or significant regional mass effect. 2. Loss of normal flow voids throughout the left ICA and MCA, consistent with slow flow and/or occlusion. 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 3. Underlying mild chronic microvascular ischemic disease. Tiny remote left ACA distribution infarct.   PAIN:  Are you having pain? No   PATIENT GOALS talk better   OBJECTIVE:    TODAY'S TREATMENT:   Targeted intelligibility, communication strategies, and initiation of repairs with sentence level reading task, sentence generation, and Table Topics card game tasks. While reading aloud sentences from a topic of interest, patient utilized tapping stick and moving his finger under the text as a guide to slow down words. Patient required very few cues to re-read words, as patient had good awareness of unintelligible words and initiated repairs of these words. Intelligibility to student clinician of  structured sentences was 90-100% with repairs. During Table Topics game, patient required min-mod cues to slow down, using accurate amount of taps, and taking breaks when fatigued. Patient initiated repairs of some words, but intelligibility to student clinician was 70-80%. Lastly, patient generated 12 sentences using multisyllabic words. Probation officer to provide feedback and generate patient awareness of communication breakdowns.   PATIENT EDUCATION: Education details: using strategies for intelligible speech, taking breaks when feeling fatigued  Person educated: Patient  Education method: Explanation Education comprehension: verbalized understanding and needs further education     GOALS: Goals reviewed with patient? Yes   SHORT TERM GOALS: Target date: 10 sessions   Patient will participate in clinical assessment of swallow function with goals added as needed. Baseline: Goal status: MET   2.  Gerald Bauer will communicate emergency information 100% accuracy using visual aid if necessary.  Baseline:  Goal status: MET   3.  Gerald Bauer will approximate personally relevant words and phrases >80% accuracy using script training and min visual cues for apraxia. Baseline:  Goal status: MET   4.  Gerald Bauer will generate at least 4 descriptors of target word 80% of the time using semantic feature analysis to improve abilities in wordfinding and resolving communication breakdowns.  Baseline:  Goal status: MET   5.  Gerald Bauer will complete HEP for dysphagia with rare min A. Baseline:  Goal status: MET   6.  Gerald Bauer will generate sentences using personally relevant words list for >80% intelligibility. Baseline: 50% intelligibility Goal status: MET   7.  Gerald Bauer will improve expressive language skills by taking 4-6 turns in simple conversation, with supported conversation strategies if needed. Baseline:  Goal status: MET 8.  Gerald Bauer will generate sentences moderately complex sentences 8-10 words >80%  intelligibility, with self-correction/multimodal communication allowed. Baseline: 50% intelligibility Goal status: MET   9.  Patient will use intelligibility strategies and script training for 4-6 turn telephone exchange with family member or friend. Baseline:  Goal status: MET  10.  Gerald Bauer will demonstrate operational competence using communication device by independently powering on/off his device, navigating forward/back and selecting icons. Baseline:  Goal status: DEFERRED   11.  Gerald Bauer will answer simple questions using AAC device to provide information re: emotional state, pain, and preferences. Baseline:  Goal status: DEFERRED   12.  Gerald Bauer will use AAC in social exchange of 2-4 turns to share and request information.  Baseline:  Goal status: DEFERRED  13.  Gerald Bauer will achieve 80% intelligibility in 6-8 turn exchange with less familiar listener or unknown context. Baseline: by visit 44 Goal status: ONGOING  14.  Gerald Bauer will monitor for signs of listener comprehension at sentence level and initiate repair when breakdowns occur in 80% of opportunities. Baseline: by visit 44 Goal status: ONGOING        LONG TERM GOALS: Target date: 07/24/2022   Gerald Bauer will engage in 10-12 minutes simple-mod complex conversation re: topic of interest with supported conversation, aphasia compensations.  Baseline: 01/25/22: 5 turn exchange. Goal renewed 01/25/22, achieved 5-8 min 04/26/22 and goal revised.  Goal status: REVISED   2.  Gerald Bauer will ID and attempt repair of communication breakdowns >90% of the time for less familiar listener.    Baseline: 01/25/22: requires occasional mod cues to initiate correction; renewed 01/25/22, achieved 04/26/22 and revised for less-familiar listener Goal status: REVISED   3.  Gerald Bauer/family will demonstrate knowledge of community resources and activities to support language/communication. Baseline: 01/25/22: referral completed to [facility]; Gerald Bauer has yet to register for group, renewed 01/25/22 Goal  status: ACHIEVED   4.  Gerald Bauer will demonstrate use of swallowing precautions independently with s/sx aspiration <5% of trials. Baseline: 01/25/22: no overt s/sx when sipping water in sessions, Gerald Bauer and family report no issues at home with meals Goal status: DEFERRED       ASSESSMENT:   CLINICAL IMPRESSION: Patient continues with aphasia and verbal apraxia, with improvement in aphasia severity (mild) noted compared with initial evaluation (moderate). While language abilities have improved significantly, this reveals Gerald Bauer's apraxia to be more moderate-severe in nature. Intelligibility is impacted significantly at the sentence level, but has improved with the usage of a tapping stick which helps him focus on slowing down his rate of speech. Patient's usage of gestures and rephrasing sentences has increased. He has used some scripts to improve intelligibility for short interactions with family/community, however Gerald Bauer still remains limited in his ability to convey information quickly and outside of highly structured interactions. For this reason, have discussed benefits and potential for using a communication device to supplement and augment Gerald Bauer's verbal communication; Gerald Bauer was initially interested in pursuing this but has opted to continue with focus on verbal communication. I recommend skilled ST to improve Gerald Bauer's language and motor speech skills to improve communication and  reduce frustration.    OBJECTIVE IMPAIRMENTS include expressive language, receptive language, aphasia, apraxia, dysarthria, and dysphagia. These impairments are limiting patient from managing medications, managing appointments, managing finances, household responsibilities, ADLs/IADLs, effectively communicating at home and in community, and safety when swallowing. Factors affecting potential to achieve goals and functional outcome are cooperation/participation level; Gerald Bauer appears highly motivated with good family support. Patient will benefit from  skilled SLP services to address above impairments and improve overall function.   REHAB POTENTIAL: Excellent   PLAN: SLP FREQUENCY: 2x/week   SLP DURATION: 12 weeks   PLANNED INTERVENTIONS: Aspiration precaution training, Pharyngeal strengthening exercises, Diet toleration management , Language facilitation, Environmental controls, Trials of upgraded texture/liquids, Cognitive reorganization, Internal/external aids, Functional tasks, Multimodal communication approach, SLP instruction and feedback, Compensatory strategies, and Patient/family education  Kadesha Virrueta Planter, [place] 05/10/2022, 10:02 AM  [place] [facility] [place], [place], [address] Phone: [phone]   Fax:  [phone]

## 2022-05-10 NOTE — Therapy (Signed)
OCCUPATIONAL THERAPY TREATMENT NOTE MRN: [id number] DOB:04-Dec-1945, 77 y.o., male Today's Date: 03/06/2022  PCP: Threasa Alpha, PA REFERRING PROVIDER: Reesa Chew    OT End of Session - 05/10/22 1203     Visit Number 43    Number of Visits 87    Date for OT Re-Evaluation 07/12/22    Authorization Time Period 01/23/2022    OT Start Time 1017    OT Stop Time 1100    OT Time Calculation (min) 43 min    Activity Tolerance Patient tolerated treatment well    Behavior During Therapy WFL for tasks assessed/performed                        Past Medical History:  Diagnosis Date   Acid reflux    Aortic atherosclerosis ([place])    a. Noted on CT Abd in 2012.   Arthritis    hands   Carotid atherosclerosis    a. 02/2018 U/S: RICA 123456, LICA 123456; b. [id number] MRA Head/Neck: near complete occlusion L ICA and L MCA. Patent RICA.   Diabetes mellitus without complication (HCC)    Diastolic dysfunction    a. 09/2021 Echo: EF 55-60%, no rwma, GrI DD, nl RV fxn, RVSP 14.56mHg.   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 09/2021 MRI/MRA Brain: Acute L MCA distribution infarct. Near complete occlusion L ICA and MCA.   Tachycardia    Tinea pedis of both feet 01/10/2017   Past Surgical History:  Procedure Laterality Date   APPENDECTOMY  1973   CHOLECYSTECTOMY  2013   PROSTATECTOMY  2012   Patient Active Problem List   Diagnosis Date Noted   H/O prostatectomy 02/06/2022   Hemiparesis, aphasia, and dysphagia as late effects of stroke ([facility] 11/07/2021   Lower urinary tract symptoms (LUTS) 11/07/2021   Myalgia due to statin 02/21/2021   Type 2 diabetes mellitus without complication, without  long-term current use of insulin ([facility] 02/21/2021   Aortic atherosclerosis ([facility] 04/27/2020   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   Onychomycosis of multiple toenails with type 2 diabetes mellitus ([facility] 07/19/2016   Carotid stenosis 03/13/2016   Hyperlipidemia associated with type 2 diabetes mellitus ([facility]    Hypertension associated with type 2 diabetes mellitus ([facility] [place]    Personal history of prostate cancer    Carotid atherosclerosis    GERD (gastroesophageal reflux disease) 08/20/2013    ONSET DATE: 10/05/21  REFERRING DIAG: L MCA CVA  THERAPY DIAG:  Muscle weakness (generalized)  Other lack of coordination  Rationale for Evaluation and Treatment Rehabilitation  SUBJECTIVE:   SUBJECTIVE STATEMENT:   Pt. reports that he is going home to his house for the weekend.  PERTINENT HISTORY: Per chart, WMeco Schlaferis a 77year old right-handed male with history of hypertension, hyperlipidemia, diabetes mellitus, prostate cancer/prostatectomy 2012, quit smoking 24 years ago.  [facility] right side weakness/facial droop and expressive aphasia.  Blood pressure 155/102.  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  No associated hemorrhage or significant mass effect.    PAIN:  Are you having pain? No reports of  pain today  FALLS: Has patient fallen in last 6 months? Yes. Number of falls 3 since CVA  PLOF: Independent/retired Dealer  PATIENT GOALS : Pt points to his hand (wants to be able to use his hand)  OBJECTIVE:   HAND DOMINANCE: Right   FUNCTIONAL OUTCOME MEASURES: FOTO: 48  UUPPER EXTREMITY ROM      Active ROM Right eval Left Eval WNL Right 12/11/21 Right 01/23/2022 Right 03/15/2022 Right 04/19/2022  Shoulder flexion 90 (135)   95 (110) 95 97 sitting 87 sitting  Shoulder abduction 95 (110)   80 (90) 95 96 sitting 86 sitting  Wrist flex     54 60 62 62  Wrist ext     41 45 48 50    UPPER EXTREMITY MMT:       MMT Right eval Left Eval 5/5 Right 12/11/21 Right 03/15/22 Right 04/19/2022  Shoulder flexion 3-   -3 3- 3-  Shoulder abduction 3-   -3 3- 3-  Shoulder adduction          Shoulder extension          Shoulder internal rotation 3-        Shoulder external rotation 3-        Middle trapezius          Lower trapezius          Elbow flexion 4-   4 4+ 5  Elbow extension 4+   5 5 5  $ Wrist flexion 4   NT d/t pain 4 4+  Wrist extension 4-   NT d/t pain 4 4+  (Blank rows = not tested)   HAND FUNCTION: Eval:         Grip strength: Right: 14 lbs; Left: 58 lbs, Lateral pinch: Right: 7 lbs, Left: 19 lbs, and 3 point pinch: Right: 2 lbs, Left: 16 lbs 10th visit: Grip strength: Right: 16  lbs;                                Lateral pinch: Right: 9  lbs;                            3 point pinch: Right: 5 lbs 20th visit: Grip strength: Right: 20 lbs; Left: 70 lbs, Lateral pinch: Right: 10 lbs, Left: 20 lbs, and 3 point pinch: Right: 9 lbs, Left: 20 lbs  30th visit: Grip strength: Right: 18 lbs; Left: 70 lbs, Lateral pinch: Right: 12 lbs, Left: 20 lbs, and 3 point pinch: Right: 8 lbs, Left: 20 lbs  [id number] Recert: Grip strength: Right: 20 lbs; Left: 70 lbs, Lateral pinch: Right: 10 lbs, Left: 20 lbs, and 3 point pinch: Right: 8 lbs, Left: 20 lbs    Eval: COORDINATION: Finger Nose Finger test: difficult/lacks precision with reaching targets 9 Hole Peg test: Right: unable sec; Left: 27 sec Able to oppose 2nd and 3rd digits to thumb on R hand   10th visit 9 hole Peg test: Right: 11 min, 44 sec   20th visit:  9 hole Peg test: Right: 3 min 6 sec   30th visit:  9 hole Peg test: Right: 1 min 45 sec   [id number] Recert:  9 hole Peg test: Right: 1 min 30 sec     TODAY'S TREATMENT:   Neuromuscular re-education:  Pt. worked on using the right hand for grasping, flipping, turning, and stacking [place]  style discs. Pt. required visual demonstration, and cues for movement patterns. Pt. worked on speed,  and Soil scientist.  Pt. Focused on turing the discs in her hand while stabilizing the discs between her 3rd digit, and thumb while turning the disc with  digit.   Pt. required increased time, and multiple cues for visual demonstration to stabilize the [place] discs between his 3rd digit, and thumb while turning the discs with his 2nd digit. Pt. was able to stack a set of 4 discs, and place them back into the container. Pt. Presented with difficulty moving the discs through his hand from his palm to the tip of his 2nd digit, and thumb. Pt. continues to work on increasing right UE ROM, strength, motor control, and coordination in the right hand to improve overall RUE functioning for increased independence with ADLs, and IADL tasks.     PATIENT EDUCATION: Education details: Typing strategies for working at home.  Person educated: pt/daughter Education method: Explanation and Verbal cues Education comprehension: verbalized understanding  HOME EXERCISE PROGRAM: Theraputty, hand writing skills, table slides  GOALS: Goals reviewed with patient? Yes   SHORT TERM GOALS: Target date 05/31/2022       Pt will be indep to perform HEP for increasing strength and coordination throughout RUE.  Baseline: Not yet initiated; 10th: putty given but pt reports limited use, instructed in table slides and self PROM for R wrist/digits. 20th:  Pt performing exercises but requires continual upgrades and instruction 30th: Pt performing exercises but requires continual upgrades, modifications, and instruction as needed 04/19/2022: Independent with HEPs. Will continue to upgrade as indicated. 40th visit: Independent Goal status: ongoing   2.  Pt will manage clothing fasteners with extra time, using RUE as an assist  Baseline: unable; pt has elastic laces and wears elastic waisted pants; 10th: pt uses R hand as an assist to zip and button jeans with extra time; not yet tried small buttons on a shirt.  Pt continues  to use elastic laces on shoes. 20th:  Improving with manipulation skills but still using elastic laces, able to button pants and shirt with increased time and effort. 30th: Pt. Is able to fasten pant fasteners, and button pants efficiently, ans independently. Goal status: met   LONG TERM GOALS: Target date:07/12/2022   Pt will increase FOTO score to 55 or better to indicate improved performance with daily tasks.  Baseline: 48; 10th: 48; 20th: score:42 30th visit: 64, 40th visit: 65 Goal status: Achieved   2.  Pt will increase R grip strength by 10 or more lbs to enable pt to hold and carry light ADL supplies without dropping.  Baseline: R grip 14#, L 58#; 10th visit: R grip 16; 20th visit: 20# 30th: 18#, Pt. Continues to have difficulty holding supplies securely. 04/19/2022: R: 20# 40th visit: Pt. Continues to have difficulty holding supplies securely Goal status: ongoing   3.  Pt will increase RUE strength to be able to engage RUE into ADLs at least 50% of the time. Baseline: Pt using L non-dominant arm to manage ADLs; 10th visit: Son reports he constantly sees pt attempt to use his R arm for daily tasks, but continues to be limited and still must use LUE for most tasks (see chart for RUE MMT) 20th:  continues to improve on strength and is working on consistency of use. 30th visit: Pt. Continues to use the right hand during daily ADL, and IADL tasks. 04/19/2022: RUE strength continues to be limited. Pt. Is in  the process of having his persistent arm pain assessed by an orthopedic physician. Pt. Is consistently engaging his right hand during ADLs more than  50% of the time.  Goal status: Partially met. Goal deferred, and new goal added for Right shoulder ROM    4.  Pt will complete 9 hole peg test on the R in 1 min or less to work towards ability to use R hand to pick up small ADL supplies.  Baseline: Pt can remove a peg but can not pick one up; 10th visit: R 9 hole completion 11 min 44 sec; 20th  visit:   Pt improved to 3 mins and 6 secs, nearing goal. 30 visit: 1 min. & 45 sec. 04/19/2022: 1 min. & 30 sec. 40th visit: Pt. Continues to present with limited [facility] skill, and difficulty manipulating small objects. Goal status: Ongoing   5.  Pt will improve right pinch  strength to be able to open a tab on a can of soup with modified independence.  Baseline: Right lateral pinch 10#, 3pt. Pinch 8#. Pt. is unable to open, and pull the tab on a can of soup. 40th visit: Pt.  Has difficulty opening, and pulling the tab on a can of soup Goal status: Ongoing  6. Pt. will increase right shoulder ROM by 10 degrees to improve functional reach during ADLs, and IADL. Baseline: Right shoulder AROM flexion in sitting; 87, Abduction: 86; 40th visit: Right shoulder flexion in limited. New diagnosis for frozen shoulder, received an injection yesterday. Pt. reports having a good response with decreased pain following. Goal status: Ongoing  ASSESSMENT:  CLINICAL IMPRESSION:  Pt. required increased time, and multiple cues for visual demonstration to stabilize the [place] discs between his 3rd digit, and thumb while turning the discs with his 2nd digit. Pt. was able to stack a set of 4 discs, and place them back into the container. Pt. Presented with difficulty moving the discs through his hand from his palm to the tip of his 2nd digit, and thumb. Pt. continues to work on increasing right UE ROM, strength, motor control, and coordination in the right hand to improve overall RUE functioning for increased independence with ADLs, and IADL tasks.   PERFORMANCE DEFICITS in functional skills including ADLs, IADLs, coordination, dexterity, ROM, strength, pain, flexibility, FMC, GMC, mobility, balance, continence, decreased knowledge of use of DME, and UE functional use, cognitive skills including safety awareness, and psychosocial skills including.   IMPAIRMENTS are limiting patient from ADLs, IADLs, leisure, and social  participation.   COMORBIDITIES may have co-morbidities  that affects occupational performance. Patient will benefit from skilled OT to address above impairments and improve overall function.  MODIFICATION OR ASSISTANCE TO COMPLETE EVALUATION: Min-Moderate modification of tasks or assist with assess necessary to complete an evaluation.  OT OCCUPATIONAL PROFILE AND HISTORY: Problem focused assessment: Including review of records relating to presenting problem.  CLINICAL DECISION MAKING: Moderate - several treatment options, min-mod task modification necessary  REHAB POTENTIAL: Good  EVALUATION COMPLEXITY: Moderate    PLAN: OT FREQUENCY: 2x/week  OT DURATION: 12 weeks  PLANNED INTERVENTIONS: self care/ADL training, therapeutic exercise, therapeutic activity, neuromuscular re-education, manual therapy, passive range of motion, balance training, functional mobility training, moist heat, cryotherapy, patient/family education, cognitive remediation/compensation, energy conservation, coping strategies training, and DME and/or AE instructions  RECOMMENDED OTHER SERVICES: N/A  CONSULTED AND AGREED WITH PLAN OF CARE: Patient and family member/caregiver  PLAN FOR NEXT SESSION: HEP progression, neuro re-ed, therapeutic exercises   Harrel Carina, MS,  OTR/L

## 2022-05-15 ENCOUNTER — Ambulatory Visit: Payer: Medicare Other | Admitting: Speech Pathology

## 2022-05-15 ENCOUNTER — Ambulatory Visit: Payer: Medicare Other | Admitting: Occupational Therapy

## 2022-05-15 ENCOUNTER — Encounter: Payer: Self-pay | Admitting: Speech Pathology

## 2022-05-15 ENCOUNTER — Ambulatory Visit: Payer: Medicare Other

## 2022-05-15 DIAGNOSIS — R4701 Aphasia: Secondary | ICD-10-CM

## 2022-05-15 DIAGNOSIS — R482 Apraxia: Secondary | ICD-10-CM | POA: Diagnosis not present

## 2022-05-15 DIAGNOSIS — M6281 Muscle weakness (generalized): Secondary | ICD-10-CM

## 2022-05-15 NOTE — Therapy (Signed)
OCCUPATIONAL THERAPY TREATMENT NOTE MRN: [id number] DOB:[birth year], 77 y.o., male Today's Date: 03/06/2022  PCP: Threasa Alpha, PA REFERRING PROVIDER: Reesa Chew    OT End of Session - 05/15/22 1114     Visit Number 44    Number of Visits 21    Date for OT Re-Evaluation 07/12/22    OT Start Time 1018    OT Stop Time 1100    OT Time Calculation (min) 42 min    Activity Tolerance Patient tolerated treatment well    Behavior During Therapy WFL for tasks assessed/performed                        Past Medical History:  Diagnosis Date   Acid reflux    Aortic atherosclerosis ([place])    a. Noted on CT Abd in 2012.   Arthritis    hands   Carotid atherosclerosis    a. 02/2018 U/S: RICA 123456, LICA 123456; b. [id number] MRA Head/Neck: near complete occlusion L ICA and L MCA. Patent RICA.   Diabetes mellitus without complication (HCC)    Diastolic dysfunction    a. 09/2021 Echo: EF 55-60%, no rwma, GrI DD, nl RV fxn, RVSP 14.24mHg.   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 09/2021 MRI/MRA Brain: Acute L MCA distribution infarct. Near complete occlusion L ICA and MCA.   Tachycardia    Tinea pedis of both feet 01/10/2017   Past Surgical History:  Procedure Laterality Date   APPENDECTOMY  1973   CHOLECYSTECTOMY  2013   PROSTATECTOMY  2012   Patient Active Problem List   Diagnosis Date Noted   H/O prostatectomy 02/06/2022   Hemiparesis, aphasia, and dysphagia as late effects of stroke ([facility] [place] 11/07/2021   Lower urinary tract symptoms (LUTS) 11/07/2021   Myalgia due to statin 02/21/2021   Type 2 diabetes mellitus without complication, without long-term current use of insulin ([facility]  02/21/2021   Aortic atherosclerosis ([facility] 04/27/2020   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   Onychomycosis of multiple toenails with type 2 diabetes mellitus ([facility] 07/19/2016   Carotid stenosis 03/13/2016   Hyperlipidemia associated with type 2 diabetes mellitus ([facility]    Hypertension associated with type 2 diabetes mellitus ([facility]    Personal history of prostate cancer    Carotid atherosclerosis    GERD (gastroesophageal reflux disease) 08/20/2013    ONSET DATE: 10/05/21  REFERRING DIAG: L MCA CVA  THERAPY DIAG:  Muscle weakness (generalized)  Rationale for Evaluation and Treatment Rehabilitation  SUBJECTIVE:   SUBJECTIVE STATEMENT:   Pt. reports that he went home to his house for the weekend, and cleaned his kitchen.  PERTINENT HISTORY: Per chart, Gerald Bauer a 77year old right-handed male with history of hypertension, hyperlipidemia, diabetes mellitus, prostate cancer/prostatectomy 2012, quit smoking 24 years ago.  [facility] right side weakness/facial droop and expressive aphasia.  Blood pressure 155/102.  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  No associated hemorrhage or significant mass effect.    PAIN:  Are you having pain? No reports of pain today  FALLS: Has patient fallen in last  6 months? Yes. Number of falls 3 since CVA  PLOF: Independent/retired Dealer  PATIENT GOALS : Pt points to his hand (wants to be able to use his hand)  OBJECTIVE:   HAND DOMINANCE: Right   FUNCTIONAL OUTCOME MEASURES: FOTO: 48  UUPPER EXTREMITY ROM      Active ROM Right eval Left Eval WNL Right 12/11/21 Right 01/23/2022 Right 03/15/2022 Right 04/19/2022  Shoulder flexion 90 (135)   95 (110) 95 97 sitting 87 sitting  Shoulder abduction 95 (110)   80 (90) 95 96 sitting 86 sitting  Wrist flex     54 60 62 62  Wrist ext     41 45 48 50    UPPER EXTREMITY MMT:      MMT Right eval Left Eval 5/5 Right 12/11/21  Right 03/15/22 Right 04/19/2022  Shoulder flexion 3-   -3 3- 3-  Shoulder abduction 3-   -3 3- 3-  Shoulder adduction          Shoulder extension          Shoulder internal rotation 3-        Shoulder external rotation 3-        Middle trapezius          Lower trapezius          Elbow flexion 4-   4 4+ 5  Elbow extension 4+   5 5 5  $ Wrist flexion 4   NT d/t pain 4 4+  Wrist extension 4-   NT d/t pain 4 4+  (Blank rows = not tested)   HAND FUNCTION: Eval:         Grip strength: Right: 14 lbs; Left: 58 lbs, Lateral pinch: Right: 7 lbs, Left: 19 lbs, and 3 point pinch: Right: 2 lbs, Left: 16 lbs 10th visit: Grip strength: Right: 16  lbs;                                Lateral pinch: Right: 9  lbs;                            3 point pinch: Right: 5 lbs 20th visit: Grip strength: Right: 20 lbs; Left: 70 lbs, Lateral pinch: Right: 10 lbs, Left: 20 lbs, and 3 point pinch: Right: 9 lbs, Left: 20 lbs  30th visit: Grip strength: Right: 18 lbs; Left: 70 lbs, Lateral pinch: Right: 12 lbs, Left: 20 lbs, and 3 point pinch: Right: 8 lbs, Left: 20 lbs  [id number] Recert: Grip strength: Right: 20 lbs; Left: 70 lbs, Lateral pinch: Right: 10 lbs, Left: 20 lbs, and 3 point pinch: Right: 8 lbs, Left: 20 lbs    Eval: COORDINATION: Finger Nose Finger test: difficult/lacks precision with reaching targets 9 Hole Peg test: Right: unable sec; Left: 27 sec Able to oppose 2nd and 3rd digits to thumb on R hand   10th visit 9 hole Peg test: Right: 11 min, 44 sec   20th visit:  9 hole Peg test: Right: 3 min 6 sec   30th visit:  9 hole Peg test: Right: 1 min 45 sec   [id number] Recert:  9 hole Peg test: Right: 1 min 30 sec     TODAY'S TREATMENT:   Neuromuscular re-education:  Pt. worked on grasping coins from an elevated surface at the tabletop,  storing them in the palm of the right hand, and  translatory movements moving them fromhis palm to the tip of the 2nd digit, and thumb in preparation for placing them  into a resistive container and pushing them through the slot while isolating his 2nd digit.  Pt. worked on grasping, and storing one then increased the challenge to working with 2 coins. Pt. occasionally dropped the coins from the ulnar aspect of his hand when attempting to press the coins through the resistive slot.   There. Ex.:  Pt. performed right gross gripping with a gross grip strengthener. Pt. worked on sustaining grip while grasping pegs and reaching at various heights. The Gripper was set to 17.9# of grip strength resistance. Pt. worked on isolated digit flexion using the 1# DigiFlex, individual, and simultaneous digit resistive flexion.  Pt. reports that he used his hand when cleaning his kitchen this past weekend. Pt. Is planning to transition from 2x's a week to 1x a week frequency for all therapies next week. Pt. dropped several pennies from the ulnar aspect of his hand when preparing to press one into the resistive container. Pt. Did not drop any nickels from the palm of his hand. Pt. continues to work on increasing right UE ROM, strength, motor control, and coordination in the right hand to improve overall RUE functioning for increased independence with ADLs, and IADL tasks.     PATIENT EDUCATION: Education details: Typing strategies for working at home.  Person educated: pt/daughter Education method: Explanation and Verbal cues Education comprehension: verbalized understanding  HOME EXERCISE PROGRAM: Theraputty, hand writing skills, table slides  GOALS: Goals reviewed with patient? Yes   SHORT TERM GOALS: Target date 05/31/2022       Pt will be indep to perform HEP for increasing strength and coordination throughout RUE.  Baseline: Not yet initiated; 10th: putty given but pt reports limited use, instructed in table slides and self PROM for R wrist/digits. 20th:  Pt performing exercises but requires continual upgrades and instruction 30th: Pt performing exercises but requires  continual upgrades, modifications, and instruction as needed 04/19/2022: Independent with HEPs. Will continue to upgrade as indicated. 40th visit: Independent Goal status: ongoing   2.  Pt will manage clothing fasteners with extra time, using RUE as an assist  Baseline: unable; pt has elastic laces and wears elastic waisted pants; 10th: pt uses R hand as an assist to zip and button jeans with extra time; not yet tried small buttons on a shirt.  Pt continues to use elastic laces on shoes. 20th:  Improving with manipulation skills but still using elastic laces, able to button pants and shirt with increased time and effort. 30th: Pt. Is able to fasten pant fasteners, and button pants efficiently, ans independently. Goal status: met   LONG TERM GOALS: Target date:07/12/2022   Pt will increase FOTO score to 55 or better to indicate improved performance with daily tasks.  Baseline: 48; 10th: 48; 20th: score:42 30th visit: 64, 40th visit: 65 Goal status: Achieved   2.  Pt will increase R grip strength by 10 or more lbs to enable pt to hold and carry light ADL supplies without dropping.  Baseline: R grip 14#, L 58#; 10th visit: R grip 16; 20th visit: 20# 30th: 18#, Pt. Continues to have difficulty holding supplies securely. 04/19/2022: R: 20# 40th visit: Pt. Continues to have difficulty holding supplies securely Goal status: ongoing   3.  Pt will increase RUE strength to be able to engage RUE into ADLs at least 50% of the time. Baseline: Pt using L  non-dominant arm to manage ADLs; 10th visit: Son reports he constantly sees pt attempt to use his R arm for daily tasks, but continues to be limited and still must use LUE for most tasks (see chart for RUE MMT) 20th:  continues to improve on strength and is working on consistency of use. 30th visit: Pt. Continues to use the right hand during daily ADL, and IADL tasks. 04/19/2022: RUE strength continues to be limited. Pt. Is in the process of having his persistent  arm pain assessed by an orthopedic physician. Pt. Is consistently engaging his right hand during ADLs more than  50% of the time.  Goal status: Partially met. Goal deferred, and new goal added for Right shoulder ROM    4.  Pt will complete 9 hole peg test on the R in 1 min or less to work towards ability to use R hand to pick up small ADL supplies.  Baseline: Pt can remove a peg but can not pick one up; 10th visit: R 9 hole completion 11 min 44 sec; 20th visit:   Pt improved to 3 mins and 6 secs, nearing goal. 30 visit: 1 min. & 45 sec. 04/19/2022: 1 min. & 30 sec. 40th visit: Pt. Continues to present with limited [facility] skill, and difficulty manipulating small objects. Goal status: Ongoing   5.  Pt will improve right pinch  strength to be able to open a tab on a can of soup with modified independence.  Baseline: Right lateral pinch 10#, 3pt. Pinch 8#. Pt. is unable to open, and pull the tab on a can of soup. 40th visit: Pt.  Has difficulty opening, and pulling the tab on a can of soup Goal status: Ongoing  6. Pt. will increase right shoulder ROM by 10 degrees to improve functional reach during ADLs, and IADL. Baseline: Right shoulder AROM flexion in sitting; 87, Abduction: 86; 40th visit: Right shoulder flexion in limited. New diagnosis for frozen shoulder, received an injection yesterday. Pt. reports having a good response with decreased pain following. Goal status: Ongoing  ASSESSMENT:  CLINICAL IMPRESSION:  Pt. reports that he used his hand when cleaning his kitchen this past weekend. Pt. Is planning to transition from 2x's a week to 1x a week frequency for all therapies next week. Pt. dropped several pennies from the ulnar aspect of his hand when preparing to press one into the resistive container. Pt. Did not drop any nickels from the palm of his hand. Pt. continues to work on increasing right UE ROM, strength, motor control, and coordination in the right hand to improve overall RUE  functioning for increased independence with ADLs, and IADL tasks.   PERFORMANCE DEFICITS in functional skills including ADLs, IADLs, coordination, dexterity, ROM, strength, pain, flexibility, FMC, GMC, mobility, balance, continence, decreased knowledge of use of DME, and UE functional use, cognitive skills including safety awareness, and psychosocial skills including.   IMPAIRMENTS are limiting patient from ADLs, IADLs, leisure, and social participation.   COMORBIDITIES may have co-morbidities  that affects occupational performance. Patient will benefit from skilled OT to address above impairments and improve overall function.  MODIFICATION OR ASSISTANCE TO COMPLETE EVALUATION: Min-Moderate modification of tasks or assist with assess necessary to complete an evaluation.  OT OCCUPATIONAL PROFILE AND HISTORY: Problem focused assessment: Including review of records relating to presenting problem.  CLINICAL DECISION MAKING: Moderate - several treatment options, min-mod task modification necessary  REHAB POTENTIAL: Good  EVALUATION COMPLEXITY: Moderate    PLAN: OT FREQUENCY: 2x/week  OT  DURATION: 12 weeks  PLANNED INTERVENTIONS: self care/ADL training, therapeutic exercise, therapeutic activity, neuromuscular re-education, manual therapy, passive range of motion, balance training, functional mobility training, moist heat, cryotherapy, patient/family education, cognitive remediation/compensation, energy conservation, coping strategies training, and DME and/or AE instructions  RECOMMENDED OTHER SERVICES: N/A  CONSULTED AND AGREED WITH PLAN OF CARE: Patient and family member/caregiver  PLAN FOR NEXT SESSION: HEP progression, neuro re-ed, therapeutic exercises   Harrel Carina, MS, OTR/L

## 2022-05-15 NOTE — Therapy (Signed)
OUTPATIENT SPEECH LANGUAGE PATHOLOGY TREATMENT NOTE   Patient Name: Gerald Bauer MRN: [id number] DOB:October 29, 1945, 77 y.o., male Today's Date: 05/15/2022  PCP: [name], PA-C Referring Provider:  [name], PA-C  END OF SESSION:   End of Session - 05/15/22 1116     Visit Number 45    Number of Visits 95    Date for SLP Re-Evaluation 07/24/22    SLP Start Time 0900    SLP Stop Time  1000    SLP Time Calculation (min) 60 min    Activity Tolerance Patient tolerated treatment well             Past Medical History:  Diagnosis Date   Acid reflux    Aortic atherosclerosis ([place])    a. Noted on CT Abd in 2012.   Arthritis    hands   Carotid atherosclerosis    a. 02/2018 U/S: RICA 123456, LICA 123456; b. [id number] MRA Head/Neck: near complete occlusion L ICA and L MCA. Patent RICA.   Diabetes mellitus without complication (HCC)    Diastolic dysfunction    a. 09/2021 Echo: EF 55-60%, no rwma, GrI DD, nl RV fxn, RVSP 14.68mHg.   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 09/2021 MRI/MRA Brain: Acute L MCA distribution infarct. Near complete occlusion L ICA and MCA.   Tachycardia    Tinea pedis of both feet 01/10/2017   Past Surgical History:  Procedure Laterality Date   APPENDECTOMY  1973   CHOLECYSTECTOMY  2013   PROSTATECTOMY  2012   Patient Active Problem List   Diagnosis Date Noted   H/O prostatectomy 02/06/2022   Hemiparesis, aphasia, and dysphagia as late effects of stroke ([facility] 11/07/2021   Lower urinary tract symptoms (LUTS) 11/07/2021   Myalgia due to statin 02/21/2021   Type 2 diabetes mellitus without complication, without long-term current use of insulin ([facility]  02/21/2021   Aortic atherosclerosis ([facility] 04/27/2020   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   Onychomycosis of multiple toenails with type 2 diabetes mellitus ([facility] 07/19/2016   Carotid stenosis 03/13/2016   Hyperlipidemia associated with type 2 diabetes mellitus ([facility]    Hypertension associated with type 2 diabetes mellitus ([facility]    Personal history of prostate cancer    Carotid atherosclerosis    GERD (gastroesophageal reflux disease) 08/20/2013    ONSET DATE: 10/05/2021    REFERRING DIAG: Cerebral infarction due to unspecified occlusion of left middle cerebral artery   THERAPY DIAG:  Verbal apraxia Verbal apraxia   Rationale for Evaluation and Treatment Rehabilitation   SUBJECTIVE STATEMENT:           Pt reports having a restful weekend at home.  Pt accompanied by: self   PERTINENT HISTORY: Pt  is a 77year old male with history of left-sided carotid stenosis, non-insulin-dependent diabetes mellitus type 2, PAD, GERD, mild COPD, history of tobacco use, hyperlipidemia, hypertension, [facility], expressive aphasia, and R sided weakness and found to have left MCA CVA. [facility] Advanced to dysphagia 3/thin liquids by cup by time of d/c from CIR. 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 Difficult to fully assess cognition given severity of linguistic impairment; however, does present with decreased safety awareness, diminished problem-solving, emergent awareness, and mild impulsivity with movement."     DIAGNOSTIC FINDINGS: MBS 10/19/21: "Pt presents with overall mild oropharyngeal dysphagia. Oral phase c/b piecemeal deglutition, decreased mastication, decreased bolus cohesion, premature spillage, and weak lingual manipulation. Pharyngeal phase c/b reduced pharyngeal peristalsis + reduced BOT approximation to PPW, which results in  mild to moderate vallecular and pyriform sinuses residuals (vallecula > pyriform sinuses for solids, pyriform sinuses > vallecula for liquids). No penetration nor aspiration appreciated with thin liquid via cup, Dysphagia 1 (puree), Dysphagia 2 (chopped), Dysphagia 3 (mechanical soft), or with 13 mm Barium tablet placed in puree. Once instance of sensed aspiration with intake of large bolus of thin liquid via straw due to swallow initiation delay to pyriform sinuses and mistiming of swallow-breath cycle; despite strong reflexive cough aspirant was not fully cleared from trachea. Pt's level of swallow initiation was noted to vary with liquid consumption (over base of epiglottis and at pyriform sinuses) with level of swallow initiation for solids to be consistent at the vallecula. Pharyngeal stasis was partially cleared with reflexive secondary swallow." MRI brain 10/05/21: 1. 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 No associated hemorrhage or significant regional mass effect. 2. Loss of normal flow voids throughout the left ICA and MCA, consistent with slow flow and/or occlusion. 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 3. Underlying mild chronic microvascular ischemic disease. Tiny remote left ACA distribution infarct.   PAIN:  Are you having pain? No   PATIENT GOALS talk better   OBJECTIVE:    TODAY'S TREATMENT:   Targeted intelligibility, communication strategies, and initiation of repairs using multisyllabic words list for sentence generation. Patient utilized tapping stick as a guide to slow down words. He expressed 25 sentences with minimal cues to repeat unintelligible words, as patient had good awareness of unintelligible words and initiated repairs of these words. Intelligibility to student clinician of self-generated sentences was 70-80%, and 80-90% with repairs. Probation officer to provide  feedback and generate patient awareness of communication breakdowns. SLP facilitated 6-turn exchange re: topic of interest (racing); intelligibility 70%   PATIENT EDUCATION: Education details: using strategies for intelligible speech, taking breaks when feeling fatigued  Person educated: Patient  Education method: Explanation Education comprehension: verbalized understanding and needs further education     GOALS: Goals reviewed with patient? Yes   SHORT TERM GOALS: Target date: 10 sessions   Patient will participate in clinical assessment of swallow function with goals added as needed. Baseline: Goal status: MET   2.  Pt will communicate emergency information 100% accuracy using visual aid if necessary.  Baseline:  Goal status: MET   3.  Pt will approximate personally relevant words and phrases >80% accuracy using script training and min visual cues for apraxia. Baseline:  Goal status: MET   4.  Pt will generate at least 4 descriptors of target word 80% of the time using semantic feature analysis to improve abilities in wordfinding and resolving communication breakdowns.  Baseline:  Goal status: MET   5.  Pt will complete HEP for dysphagia with rare min A. Baseline:  Goal status: MET   6.  Pt will generate sentences using personally relevant words list for >80% intelligibility. Baseline: 50% intelligibility Goal status: MET   7.  Pt will improve expressive language skills by taking 4-6 turns in simple conversation, with supported conversation strategies if needed. Baseline:  Goal status: MET 8.  Pt will generate sentences moderately complex sentences 8-10 words >80% intelligibility, with self-correction/multimodal communication allowed. Baseline: 50% intelligibility Goal status: MET   9.  Patient will use intelligibility strategies and script training for 4-6 turn telephone exchange with family member or friend. Baseline:  Goal status: MET  10.  Pt will demonstrate  operational competence using communication device by independently powering on/off his device, navigating forward/back and selecting icons. Baseline:  Goal status: DEFERRED   11.  Pt will answer simple questions using AAC device to provide information re: emotional state, pain, and preferences. Baseline:  Goal status: DEFERRED   12.  Pt will use AAC in social exchange of 2-4 turns to share and request information.  Baseline:  Goal status: DEFERRED  13.  Pt will achieve 80% intelligibility in 6-8 turn exchange with less familiar listener or unknown context. Baseline: by visit 44 Goal status: ONGOING  14.  Pt will monitor for signs of listener comprehension at sentence level and initiate repair when breakdowns occur in 80% of opportunities. Baseline: by visit 44 Goal status: ONGOING        LONG TERM GOALS: Target date: 07/24/2022   Pt will engage in 10-12 minutes simple-mod complex conversation re: topic of interest with supported conversation, aphasia compensations.  Baseline: 01/25/22: 5 turn exchange. Goal renewed 01/25/22, achieved 5-8 min 04/26/22 and goal revised.  Goal status: REVISED   2.  Pt will ID and attempt repair of communication breakdowns >90% of the time for less familiar listener.    Baseline: 01/25/22: requires occasional mod cues to initiate correction; renewed 01/25/22, achieved 04/26/22 and revised for less-familiar listener Goal status: REVISED   3.  Pt/family will demonstrate knowledge of community resources and activities to support language/communication. Baseline: 01/25/22: referral completed to [facility]; pt has yet to register for group, renewed 01/25/22 Goal status: ACHIEVED   4.  Pt will demonstrate use of swallowing precautions independently with s/sx aspiration <5% of trials. Baseline: 01/25/22: no overt s/sx when sipping water in sessions, pt and family report no issues at home with meals Goal status: DEFERRED       ASSESSMENT:   CLINICAL  IMPRESSION: Patient continues with aphasia and verbal apraxia, with improvement in aphasia severity (mild) noted compared with initial evaluation (moderate). 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 Intelligibility is impacted significantly at the sentence level, but has improved with the usage of a tapping stick which helps him focus on slowing down his rate of speech. He is aware when words are unintelligible and initiates repairs most of the time. Patient's usage of gestures and rephrasing sentences has increased. He has used some scripts to improve intelligibility for short interactions with family/community, however pt still remains limited in his ability to convey information quickly and outside of highly structured interactions. For this reason, have discussed benefits and potential for using a communication device to supplement and augment pt's verbal communication; pt was initially interested in pursuing this but has opted to continue with focus on verbal communication. I recommend skilled ST to improve pt's language and motor speech skills to improve communication and reduce frustration.    OBJECTIVE IMPAIRMENTS include expressive language, receptive language, aphasia, apraxia, dysarthria, and dysphagia. These impairments are limiting patient from managing medications, managing appointments, managing finances, household responsibilities, ADLs/IADLs, effectively communicating  at home and in community, and safety when swallowing. Factors affecting potential to achieve goals and functional outcome are cooperation/participation level; pt appears highly motivated with good family support. Patient will benefit from skilled SLP services to address above impairments and improve overall function.   REHAB POTENTIAL: Excellent   PLAN: SLP FREQUENCY: 2x/week   SLP DURATION: 12 weeks   PLANNED INTERVENTIONS: Aspiration precaution  training, Pharyngeal strengthening exercises, Diet toleration management , Language facilitation, Environmental controls, Trials of upgraded texture/liquids, Cognitive reorganization, Internal/external aids, Functional tasks, Multimodal communication approach, SLP instruction and feedback, Compensatory strategies, and Patient/family education  Kristine Tiley Planter, [place] 05/15/2022, 11:18 AM  [place] at [facility] [place], [place], [address] Phone: [phone]   Fax:  [phone]

## 2022-05-17 ENCOUNTER — Ambulatory Visit: Payer: Medicare Other | Admitting: Speech Pathology

## 2022-05-17 ENCOUNTER — Ambulatory Visit: Payer: Medicare Other | Admitting: Occupational Therapy

## 2022-05-17 ENCOUNTER — Ambulatory Visit: Payer: Medicare Other

## 2022-05-17 ENCOUNTER — Encounter: Payer: Self-pay | Admitting: Occupational Therapy

## 2022-05-17 ENCOUNTER — Encounter: Payer: Self-pay | Admitting: Speech Pathology

## 2022-05-17 DIAGNOSIS — M6281 Muscle weakness (generalized): Secondary | ICD-10-CM

## 2022-05-17 DIAGNOSIS — I63512 Cerebral infarction due to unspecified occlusion or stenosis of left middle cerebral artery: Secondary | ICD-10-CM

## 2022-05-17 DIAGNOSIS — G8929 Other chronic pain: Secondary | ICD-10-CM

## 2022-05-17 DIAGNOSIS — R482 Apraxia: Secondary | ICD-10-CM | POA: Diagnosis not present

## 2022-05-17 DIAGNOSIS — R278 Other lack of coordination: Secondary | ICD-10-CM

## 2022-05-17 DIAGNOSIS — R4701 Aphasia: Secondary | ICD-10-CM

## 2022-05-17 NOTE — Therapy (Signed)
OUTPATIENT SPEECH LANGUAGE PATHOLOGY TREATMENT NOTE   Patient Name: Gerald Bauer MRN: [id number] DOB:Mar 17, 1946, 77 y.o., male Today's Date: 05/17/2022  PCP: [name], PA-C Referring Provider:  [name], PA-C  END OF SESSION:   End of Session - 05/17/22 1107     Visit Number [id number]    Number of Visits 94    Date for SLP Re-Evaluation 07/24/22    SLP Start Time 0900    SLP Stop Time  1000    SLP Time Calculation (min) 60 min    Activity Tolerance Patient tolerated treatment well             Past Medical History:  Diagnosis Date   Acid reflux    Aortic atherosclerosis ([place])    a. Noted on CT Abd in 2012.   Arthritis    hands   Carotid atherosclerosis    a. 02/2018 U/S: RICA 123456, LICA 123456; b. [id number] MRA Head/Neck: near complete occlusion L ICA and L MCA. Patent RICA.   Diabetes mellitus without complication (HCC)    Diastolic dysfunction    a. 09/2021 Echo: EF 55-60%, no rwma, GrI DD, nl RV fxn, RVSP 14.25mHg.   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 09/2021 MRI/MRA Brain: Acute L MCA distribution infarct. Near complete occlusion L ICA and MCA.   Tachycardia    Tinea pedis of both feet 01/10/2017   Past Surgical History:  Procedure Laterality Date   APPENDECTOMY  1973   CHOLECYSTECTOMY  2013   PROSTATECTOMY  2012   Patient Active Problem List   Diagnosis Date Noted   H/O prostatectomy 02/06/2022   Hemiparesis, aphasia, and dysphagia as late effects of stroke ([facility] 11/07/2021   Lower urinary tract symptoms (LUTS) 11/07/2021   Myalgia due to statin 02/21/2021   Type 2 diabetes mellitus without complication, without long-term current use of insulin ([facility]  02/21/2021   Aortic atherosclerosis ([facility] 04/27/2020   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   Onychomycosis of multiple toenails with type 2 diabetes mellitus ([facility] 07/19/2016   Carotid stenosis 03/13/2016   Hyperlipidemia associated with type 2 diabetes mellitus ([facility]    Hypertension associated with type 2 diabetes mellitus ([facility]    Personal history of prostate cancer    Carotid atherosclerosis    GERD (gastroesophageal reflux disease) 08/20/2013    ONSET DATE: 10/05/2021    REFERRING DIAG: Cerebral infarction due to unspecified occlusion of left middle cerebral artery   THERAPY DIAG:  Verbal apraxia Verbal apraxia   Rationale for Evaluation and Treatment Rehabilitation   SUBJECTIVE STATEMENT:           Pt reports he would like to decrease to once a week for speech therapy.   Pt accompanied by: daughter-in-law   PERTINENT HISTORY: Pt  is a 89year old male with history of left-sided carotid stenosis, non-insulin-dependent diabetes mellitus type 2, PAD, GERD, mild COPD, history of tobacco use, hyperlipidemia, hypertension, [facility]op, expressive aphasia, and R sided weakness and found to have left MCA CVA. [facility] Advanced to dysphagia 3/thin liquids by cup by time of d/c from CIR.  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 Difficult to fully assess cognition given severity of linguistic impairment; however, does present with decreased safety awareness, diminished problem-solving, emergent awareness, and mild impulsivity with movement."     DIAGNOSTIC FINDINGS: MBS 10/19/21: "Pt presents with overall mild oropharyngeal dysphagia. Oral phase c/b piecemeal deglutition, decreased mastication, decreased bolus cohesion, premature spillage, and weak lingual manipulation. Pharyngeal phase c/b reduced pharyngeal peristalsis + reduced BOT  approximation to PPW, which results in mild to moderate vallecular and pyriform sinuses residuals (vallecula > pyriform sinuses for solids, pyriform sinuses > vallecula for liquids). No penetration nor aspiration appreciated with thin liquid via cup, Dysphagia 1 (puree), Dysphagia 2 (chopped), Dysphagia 3 (mechanical soft), or with 13 mm Barium tablet placed in puree. Once instance of sensed aspiration with intake of large bolus of thin liquid via straw due to swallow initiation delay to pyriform sinuses and mistiming of swallow-breath cycle; despite strong reflexive cough aspirant was not fully cleared from trachea. Pt's level of swallow initiation was noted to vary with liquid consumption (over base of epiglottis and at pyriform sinuses) with level of swallow initiation for solids to be consistent at the vallecula. Pharyngeal stasis was partially cleared with reflexive secondary swallow." MRI brain 10/05/21: 1. 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 No associated hemorrhage or significant regional mass effect. 2. Loss of normal flow voids throughout the left ICA and MCA, consistent with slow flow and/or occlusion. 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 3. Underlying mild chronic microvascular ischemic disease. Tiny remote left ACA distribution infarct.   PAIN:  Are you having pain? No   PATIENT GOALS talk better   OBJECTIVE:    TODAY'S TREATMENT:     Targeted intelligibility, communication strategies,initiation of repairs, and sentence generation using multisyllabic words about topic of interest and contrastive stress madlibs. Patient utilized tapping stick as a guide to slow down words. He expressed 15 sentences with minimal cues to repeat unintelligible words, as patient had good awareness of unintelligible words and initiated repairs of these words. Intelligibility to student clinician of self-generated sentences  was 70-80%, and 80-90% with repairs. Probation officer to provide feedback and generate patient awareness of communication breakdowns. Contrastive stress sentences completed using personally-relevant vocabulary. Out of 20 target words, patient was about to vary stress pattern correctly at 80% accuracy. Minimal cues to repeat unintelligible words. SLP facilitated 5-turn exchange re: topic of interest (gardening); intelligibility 70%   PATIENT EDUCATION: Education details: using strategies for intelligible speech, taking breaks when feeling fatigued  Person educated: Patient  Education method: Explanation Education comprehension: verbalized understanding and needs further education     GOALS: Goals reviewed with patient? Yes   SHORT TERM GOALS: Target date: 10 sessions   Patient will participate in clinical assessment of swallow function with goals added as needed. Baseline: Goal status: MET   2.  Pt will communicate emergency information 100% accuracy using visual aid if necessary.  Baseline:  Goal status: MET   3.  Pt will approximate personally relevant words and phrases >80% accuracy using script training and min visual cues for apraxia. Baseline:  Goal status: MET   4.  Pt will generate at least 4 descriptors of target word 80% of the time using semantic feature analysis to improve abilities in wordfinding and resolving communication breakdowns.  Baseline:  Goal status: MET  5.  Pt will complete HEP for dysphagia with rare min A. Baseline:  Goal status: MET   6.  Pt will generate sentences using personally relevant words list for >80% intelligibility. Baseline: 50% intelligibility Goal status: MET   7.  Pt will improve expressive language skills by taking 4-6 turns in simple conversation, with supported conversation strategies if needed. Baseline:  Goal status: MET 8.  Pt will generate sentences moderately complex sentences 8-10 words >80% intelligibility,  with self-correction/multimodal communication allowed. Baseline: 50% intelligibility Goal status: MET   9.  Patient will use intelligibility strategies and script training for 4-6 turn telephone exchange with family member or friend. Baseline:  Goal status: MET  10.  Pt will demonstrate operational competence using communication device by independently powering on/off his device, navigating forward/back and selecting icons. Baseline:  Goal status: DEFERRED   11.  Pt will answer simple questions using AAC device to provide information re: emotional state, pain, and preferences. Baseline:  Goal status: DEFERRED   12.  Pt will use AAC in social exchange of 2-4 turns to share and request information.  Baseline:  Goal status: DEFERRED  13.  Pt will achieve 80% intelligibility in 6-8 turn exchange with less familiar listener or unknown context. Baseline: by visit 44 Goal status: ONGOING  14.  Pt will monitor for signs of listener comprehension at sentence level and initiate repair when breakdowns occur in 80% of opportunities. Baseline: by visit 44 Goal status: ONGOING        LONG TERM GOALS: Target date: 07/24/2022   Pt will engage in 10-12 minutes simple-mod complex conversation re: topic of interest with supported conversation, aphasia compensations.  Baseline: 01/25/22: 5 turn exchange. Goal renewed 01/25/22, achieved 5-8 min 04/26/22 and goal revised.  Goal status: REVISED   2.  Pt will ID and attempt repair of communication breakdowns >90% of the time for less familiar listener.    Baseline: 01/25/22: requires occasional mod cues to initiate correction; renewed 01/25/22, achieved 04/26/22 and revised for less-familiar listener Goal status: REVISED   3.  Pt/family will demonstrate knowledge of community resources and activities to support language/communication. Baseline: 01/25/22: referral completed to [facility]; pt has yet to register for group, renewed 01/25/22 Goal status: ACHIEVED    4.  Pt will demonstrate use of swallowing precautions independently with s/sx aspiration <5% of trials. Baseline: 01/25/22: no overt s/sx when sipping water in sessions, pt and family report no issues at home with meals Goal status: DEFERRED       ASSESSMENT:   CLINICAL IMPRESSION: Patient continues with aphasia and verbal apraxia, with improvement in aphasia severity (mild) noted compared with initial evaluation (moderate). 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 Intelligibility is impacted significantly at the sentence level, but has improved with the usage of a tapping stick which helps him focus on slowing down his rate of speech. He is aware when words are unintelligible and initiates repairs most of the time. Patient's usage of gestures and rephrasing sentences has increased. He has used some scripts to improve intelligibility for short interactions with family/community, however pt still remains limited in his ability to convey information quickly and outside of highly structured interactions. For this reason, have discussed benefits and potential for using a communication device to supplement and augment pt's verbal communication; pt was initially interested in pursuing this but has opted to continue with focus on verbal communication. I recommend skilled ST to improve pt's  language and motor speech skills to improve communication and reduce frustration.    OBJECTIVE IMPAIRMENTS include expressive language, receptive language, aphasia, apraxia, dysarthria, and dysphagia. These impairments are limiting patient from managing medications, managing appointments, managing finances, household responsibilities, ADLs/IADLs, effectively communicating at home and in community, and safety when swallowing. Factors affecting potential to achieve goals and functional outcome are cooperation/participation level; pt appears highly  motivated with good family support. Patient will benefit from skilled SLP services to address above impairments and improve overall function.   REHAB POTENTIAL: Excellent   PLAN: SLP FREQUENCY: 2x/week   SLP DURATION: 12 weeks   PLANNED INTERVENTIONS: Aspiration precaution training, Pharyngeal strengthening exercises, Diet toleration management , Language facilitation, Environmental controls, Trials of upgraded texture/liquids, Cognitive reorganization, Internal/external aids, Functional tasks, Multimodal communication approach, SLP instruction and feedback, Compensatory strategies, and Patient/family education  Raye Slyter Planter, [place] 05/17/2022, 11:09 AM  [facility] [place], [place], [address] Phone: [phone]   Fax:  [phone]

## 2022-05-17 NOTE — Therapy (Signed)
OCCUPATIONAL THERAPY TREATMENT NOTE MRN: [id number] DOB:1945/12/11, 77 y.o., male 05/17/2022   PCP: Threasa Alpha, PA REFERRING PROVIDER: Reesa Chew    OT End of Session - 05/17/22 1018     Visit Number 45    Number of Visits 55    Date for OT Re-Evaluation 07/12/22    OT Start Time 1015    OT Stop Time 1100    OT Time Calculation (min) 45 min    Activity Tolerance Patient tolerated treatment well    Behavior During Therapy WFL for tasks assessed/performed                        Past Medical History:  Diagnosis Date   Acid reflux    Aortic atherosclerosis ([place])    a. Noted on CT Abd in 2012.   Arthritis    hands   Carotid atherosclerosis    a. 02/2018 U/S: RICA 123456, LICA 123456; b. [id number] MRA Head/Neck: near complete occlusion L ICA and L MCA. Patent RICA.   Diabetes mellitus without complication (HCC)    Diastolic dysfunction    a. 09/2021 Echo: EF 55-60%, no rwma, GrI DD, nl RV fxn, RVSP 14.80mHg.   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 09/2021 MRI/MRA Brain: Acute L MCA distribution infarct. Near complete occlusion L ICA and MCA.   Tachycardia    Tinea pedis of both feet 01/10/2017   Past Surgical History:  Procedure Laterality Date   APPENDECTOMY  1973   CHOLECYSTECTOMY  2013   PROSTATECTOMY  2012   Patient Active Problem List   Diagnosis Date Noted   H/O prostatectomy 02/06/2022   Hemiparesis, aphasia, and dysphagia as late effects of stroke ([place] 11/07/2021   Lower urinary tract symptoms (LUTS) 11/07/2021   Myalgia due to statin 02/21/2021   Type 2 diabetes mellitus without complication, without long-term current use of insulin ([facility] 02/21/2021   Aortic  atherosclerosis ([facility] 04/27/2020   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   Onychomycosis of multiple toenails with type 2 diabetes mellitus ([facility] 07/19/2016   Carotid stenosis 03/13/2016   Hyperlipidemia associated with type 2 diabetes mellitus ([facility]    Hypertension associated with type 2 diabetes mellitus ([facility] [place]    Personal history of prostate cancer    Carotid atherosclerosis    GERD (gastroesophageal reflux disease) 08/20/2013    ONSET DATE: 10/05/21  REFERRING DIAG: L MCA CVA  THERAPY DIAG:  Muscle weakness (generalized)  Other lack of coordination  Left middle cerebral artery stroke (HCC)  Chronic right shoulder pain  Rationale for Evaluation and Treatment Rehabilitation  SUBJECTIVE:   SUBJECTIVE STATEMENT:  Pt reports he is doing well, no pain reported this date.  States he has been able to go home for short periods of time to spend time at his own house, watching TV and also worked to clean the kitchen.  Uses dishwasher for washing dishes.    PERTINENT HISTORY: Per chart, WHarsha Alcivaris a 77year old right-handed male with history of hypertension, hyperlipidemia, diabetes mellitus, prostate cancer/prostatectomy 2012, quit smoking 24 years ago.  [facility] right side weakness/facial droop and expressive  aphasia.  Blood pressure 155/102.  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  No associated hemorrhage or significant mass effect.    PAIN:  Are you having pain? No reports of pain today  FALLS: Has patient fallen in last 6 months? Yes. Number of falls 3 since CVA  PLOF: Independent/retired Dealer  PATIENT GOALS : Pt points to his hand (wants to be able to use his hand)  OBJECTIVE:   HAND DOMINANCE: Right   FUNCTIONAL OUTCOME MEASURES: FOTO: 48  UUPPER EXTREMITY ROM      Active ROM Right eval Left Eval WNL Right 12/11/21 Right 01/23/2022 Right 03/15/2022 Right 04/19/2022  Shoulder flexion 90 (135)    95 (110) 95 97 sitting 87 sitting  Shoulder abduction 95 (110)   80 (90) 95 96 sitting 86 sitting  Wrist flex     54 60 62 62  Wrist ext     41 45 48 50    UPPER EXTREMITY MMT:      MMT Right eval Left Eval 5/5 Right 12/11/21 Right 03/15/22 Right 04/19/2022  Shoulder flexion 3-   -3 3- 3-  Shoulder abduction 3-   -3 3- 3-  Shoulder adduction          Shoulder extension          Shoulder internal rotation 3-        Shoulder external rotation 3-        Middle trapezius          Lower trapezius          Elbow flexion 4-   4 4+ 5  Elbow extension 4+   5 5 5  $ Wrist flexion 4   NT d/t pain 4 4+  Wrist extension 4-   NT d/t pain 4 4+  (Blank rows = not tested)   HAND FUNCTION: Eval:         Grip strength: Right: 14 lbs; Left: 58 lbs, Lateral pinch: Right: 7 lbs, Left: 19 lbs, and 3 point pinch: Right: 2 lbs, Left: 16 lbs 10th visit: Grip strength: Right: 16  lbs;                                Lateral pinch: Right: 9  lbs;                            3 point pinch: Right: 5 lbs 20th visit: Grip strength: Right: 20 lbs; Left: 70 lbs, Lateral pinch: Right: 10 lbs, Left: 20 lbs, and 3 point pinch: Right: 9 lbs, Left: 20 lbs  30th visit: Grip strength: Right: 18 lbs; Left: 70 lbs, Lateral pinch: Right: 12 lbs, Left: 20 lbs, and 3 point pinch: Right: 8 lbs, Left: 20 lbs  [id number] Recert: Grip strength: Right: 20 lbs; Left: 70 lbs, Lateral pinch: Right: 10 lbs, Left: 20 lbs, and 3 point pinch: Right: 8 lbs, Left: 20 lbs    Eval: COORDINATION: Finger Nose Finger test: difficult/lacks precision with reaching targets 9 Hole Peg test: Right: unable sec; Left: 27 sec Able to oppose 2nd and 3rd digits to thumb on R hand   10th visit 9 hole Peg test: Right: 11 min, 44 sec   20th visit:  9 hole Peg test: Right: 3 min 6 sec   30th visit:  9 hole Peg test: Right: 1 min 45 sec  [id number] Recert:  9 hole Peg test: Right: 1 min 30 sec     TODAY'S TREATMENT:   Neuromuscular  re-education: Manipulation of grooved pegs to place into pegboard, occasional cues for prehension patterns for tip to tip pinch to pick up item and turn to place into grid.  Rest breaks as needed during task with hand fatigue.   Manipulation of [place] discs with right hand to turn and flip objects to opposite side.  Cues for isolated finger movements of index finger for turning of object.  Pt progressing with speed and dexterity of task.  Stacking of discs up to 11 at a time then removing from stack, turning and flipping to place back into bucket.  Was able to complete task without knocking any of the stack over during placing or removing.   Manipulation of smaller objects 1/4 inch in size, mini square blocks with tip to tip prehension to pick up and place into small glass container.    Therapeutic Exercise: Use of red medium resistive putty and use of theraputty hand tools for push, pull, flatten for hand strengthening.  Cues for grasping patterns on tools and to monitor pain in the arm during task, switching to other tools as needed.      PATIENT EDUCATION: Education details: Typing strategies for working at home.  Person educated: pt/daughter Education method: Explanation and Verbal cues Education comprehension: verbalized understanding  HOME EXERCISE PROGRAM: Theraputty, hand writing skills, table slides  GOALS: Goals reviewed with patient? Yes   SHORT TERM GOALS: Target date 05/31/2022       Pt will be indep to perform HEP for increasing strength and coordination throughout RUE.  Baseline: Not yet initiated; 10th: putty given but pt reports limited use, instructed in table slides and self PROM for R wrist/digits. 20th:  Pt performing exercises but requires continual upgrades and instruction 30th: Pt performing exercises but requires continual upgrades, modifications, and instruction as needed 04/19/2022: Independent with HEPs. Will continue to upgrade as indicated. 40th visit:  Independent Goal status: ongoing   2.  Pt will manage clothing fasteners with extra time, using RUE as an assist  Baseline: unable; pt has elastic laces and wears elastic waisted pants; 10th: pt uses R hand as an assist to zip and button jeans with extra time; not yet tried small buttons on a shirt.  Pt continues to use elastic laces on shoes. 20th:  Improving with manipulation skills but still using elastic laces, able to button pants and shirt with increased time and effort. 30th: Pt. Is able to fasten pant fasteners, and button pants efficiently, ans independently. Goal status: met   LONG TERM GOALS: Target date:07/12/2022   Pt will increase FOTO score to 55 or better to indicate improved performance with daily tasks.  Baseline: 48; 10th: 48; 20th: score:42 30th visit: 64, 40th visit: 65 Goal status: Achieved   2.  Pt will increase R grip strength by 10 or more lbs to enable pt to hold and carry light ADL supplies without dropping.  Baseline: R grip 14#, L 58#; 10th visit: R grip 16; 20th visit: 20# 30th: 18#, Pt. Continues to have difficulty holding supplies securely. 04/19/2022: R: 20# 40th visit: Pt. Continues to have difficulty holding supplies securely Goal status: ongoing   3.  Pt will increase RUE strength to be able to engage RUE into ADLs at least 50% of the time. Baseline: Pt using L non-dominant arm to manage ADLs; 10th visit: Son reports he constantly sees pt  attempt to use his R arm for daily tasks, but continues to be limited and still must use LUE for most tasks (see chart for RUE MMT) 20th:  continues to improve on strength and is working on consistency of use. 30th visit: Pt. Continues to use the right hand during daily ADL, and IADL tasks. 04/19/2022: RUE strength continues to be limited. Pt. Is in the process of having his persistent arm pain assessed by an orthopedic physician. Pt. Is consistently engaging his right hand during ADLs more than  50% of the time.  Goal status:  Partially met. Goal deferred, and new goal added for Right shoulder ROM    4.  Pt will complete 9 hole peg test on the R in 1 min or less to work towards ability to use R hand to pick up small ADL supplies.  Baseline: Pt can remove a peg but can not pick one up; 10th visit: R 9 hole completion 11 min 44 sec; 20th visit:   Pt improved to 3 mins and 6 secs, nearing goal. 30 visit: 1 min. & 45 sec. 04/19/2022: 1 min. & 30 sec. 40th visit: Pt. Continues to present with limited [facility] skill, and difficulty manipulating small objects. Goal status: Ongoing   5.  Pt will improve right pinch  strength to be able to open a tab on a can of soup with modified independence.  Baseline: Right lateral pinch 10#, 3pt. Pinch 8#. Pt. is unable to open, and pull the tab on a can of soup. 40th visit: Pt.  Has difficulty opening, and pulling the tab on a can of soup Goal status: Ongoing  6. Pt. will increase right shoulder ROM by 10 degrees to improve functional reach during ADLs, and IADL. Baseline: Right shoulder AROM flexion in sitting; 87, Abduction: 86; 40th visit: Right shoulder flexion in limited. New diagnosis for frozen shoulder, received an injection yesterday. Pt. reports having a good response with decreased pain following. Goal status: Ongoing  ASSESSMENT:  CLINICAL IMPRESSION: Pt continuing to progress well, has been able to go to his home for short periods of time and participating in things like cleaning the kitchen.  He is preparing to decrease frequency next week to 1 time a week for therapy.  He has continued to make significant gains in right dominant hand function with improved ability to perform manipulation of small objects moving down to small blocks 1/4 inch in size.  He demonstrates improvements in translatory skills of the hand and using the hand for storage but continues to drop items occasionally.  Hand fatigues quickly with strengthening and resistive tasks (use of larger block of putty this  date with tools).  Continue to work towards goals in plan of care to maximize safety and independence in necessary daily tasks.     PERFORMANCE DEFICITS in functional skills including ADLs, IADLs, coordination, dexterity, ROM, strength, pain, flexibility, FMC, GMC, mobility, balance, continence, decreased knowledge of use of DME, and UE functional use, cognitive skills including safety awareness, and psychosocial skills including.   IMPAIRMENTS are limiting patient from ADLs, IADLs, leisure, and social participation.   COMORBIDITIES may have co-morbidities  that affects occupational performance. Patient will benefit from skilled OT to address above impairments and improve overall function.  MODIFICATION OR ASSISTANCE TO COMPLETE EVALUATION: Min-Moderate modification of tasks or assist with assess necessary to complete an evaluation.  OT OCCUPATIONAL PROFILE AND HISTORY: Problem focused assessment: Including review of records relating to presenting problem.  CLINICAL DECISION MAKING: Moderate -  several treatment options, min-mod task modification necessary  REHAB POTENTIAL: Good  EVALUATION COMPLEXITY: Moderate    PLAN: OT FREQUENCY: 2x/week  OT DURATION: 12 weeks  PLANNED INTERVENTIONS: self care/ADL training, therapeutic exercise, therapeutic activity, neuromuscular re-education, manual therapy, passive range of motion, balance training, functional mobility training, moist heat, cryotherapy, patient/family education, cognitive remediation/compensation, energy conservation, coping strategies training, and DME and/or AE instructions  RECOMMENDED OTHER SERVICES: N/A  CONSULTED AND AGREED WITH PLAN OF CARE: Patient and family member/caregiver  PLAN FOR NEXT SESSION: HEP progression, neuro re-ed, therapeutic exercises   Johnathon Mittal T Sehaj Mcenroe, OTR/L, CLT 05/17/2022   1504

## 2022-05-22 ENCOUNTER — Ambulatory Visit: Payer: Medicare Other | Admitting: Speech Pathology

## 2022-05-22 ENCOUNTER — Ambulatory Visit: Payer: Medicare Other | Admitting: Occupational Therapy

## 2022-05-22 ENCOUNTER — Ambulatory Visit: Payer: Medicare Other

## 2022-05-24 ENCOUNTER — Ambulatory Visit: Payer: Medicare Other | Admitting: Occupational Therapy

## 2022-05-24 ENCOUNTER — Ambulatory Visit: Payer: Medicare Other

## 2022-05-24 ENCOUNTER — Encounter: Payer: Self-pay | Admitting: Occupational Therapy

## 2022-05-24 ENCOUNTER — Ambulatory Visit: Payer: Medicare Other | Admitting: Speech Pathology

## 2022-05-24 DIAGNOSIS — R4701 Aphasia: Secondary | ICD-10-CM

## 2022-05-24 DIAGNOSIS — R278 Other lack of coordination: Secondary | ICD-10-CM

## 2022-05-24 DIAGNOSIS — R482 Apraxia: Secondary | ICD-10-CM

## 2022-05-24 DIAGNOSIS — M6281 Muscle weakness (generalized): Secondary | ICD-10-CM

## 2022-05-24 DIAGNOSIS — I63512 Cerebral infarction due to unspecified occlusion or stenosis of left middle cerebral artery: Secondary | ICD-10-CM

## 2022-05-24 NOTE — Therapy (Signed)
OCCUPATIONAL THERAPY TREATMENT NOTE MRN: [id number] DOB:1945-06-06, 77 y.o., male 05/24/2022   PCP: Gerald Alpha, PA REFERRING PROVIDER: Reesa Bauer    OT End of Session - 05/24/22 2009     Visit Number 46    Number of Visits 36    Date for OT Re-Evaluation 07/12/22    OT Start Time 1015    OT Stop Time 1100    OT Time Calculation (min) 45 min    Activity Tolerance Patient tolerated treatment well    Behavior During Therapy WFL for tasks assessed/performed                        Past Medical History:  Diagnosis Date   Acid reflux    Aortic atherosclerosis ([place])    a. Noted on CT Abd in 2012.   Arthritis    hands   Carotid atherosclerosis    a. 02/2018 U/S: RICA 123456, LICA 123456; b. [id number] MRA Head/Neck: near complete occlusion L ICA and L MCA. Patent RICA.   Diabetes mellitus without complication (HCC)    Diastolic dysfunction    a. 09/2021 Echo: EF 55-60%, no rwma, GrI DD, nl RV fxn, RVSP 14.61mHg.   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 09/2021 MRI/MRA Brain: Acute L MCA distribution infarct. Near complete occlusion L ICA and MCA.   Tachycardia    Tinea pedis of both feet 01/10/2017   Past Surgical History:  Procedure Laterality Date   APPENDECTOMY  1973   CHOLECYSTECTOMY  2013   PROSTATECTOMY  2012   Patient Active Problem List   Diagnosis Date Noted   H/O prostatectomy 02/06/2022   Hemiparesis, aphasia, and dysphagia as late effects of stroke ([place] 11/07/2021   Lower urinary tract symptoms (LUTS) 11/07/2021   Myalgia due to statin 02/21/2021   Type 2 diabetes mellitus without complication, without long-term current use of insulin ([place] 02/21/2021   Aortic  atherosclerosis ([facility] 04/27/2020   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   Onychomycosis of multiple toenails with type 2 diabetes mellitus ([facility] 07/19/2016   Carotid stenosis 03/13/2016   Hyperlipidemia associated with type 2 diabetes mellitus ([place]    Hypertension associated with type 2 diabetes mellitus ([facility]    Personal history of prostate cancer    Carotid atherosclerosis    GERD (gastroesophageal reflux disease) 08/20/2013    ONSET DATE: 10/05/21  REFERRING DIAG: L MCA CVA  THERAPY DIAG:  Muscle weakness (generalized)  Other lack of coordination  Left middle cerebral artery stroke ([place]  Rationale for Evaluation and Treatment Rehabilitation  SUBJECTIVE:   SUBJECTIVE STATEMENT:  No pain, has been going to his house lately for short periods of time.  Has enjoyed going back home, he is doing most tasks with left hand and trying to use right hand as much as he can with tasks.     PERTINENT HISTORY: Per chart, WLavin Mcannallyis a 77year old right-handed male with history of hypertension, hyperlipidemia, diabetes mellitus, prostate cancer/prostatectomy 2012, quit smoking 24 years ago.  [facility] right side weakness/facial droop and expressive aphasia.  Blood pressure 155/102.  CT/MRI of the head  showed moderate size acute ischemic left MCA distribution infarction.  No associated hemorrhage or significant mass effect.    PAIN:  Are you having pain? No reports of pain today  FALLS: Has patient fallen in last 6 months? Yes. Number of falls 3 since CVA  PLOF: Independent/retired Dealer  PATIENT GOALS : Pt points to his hand (wants to be able to use his hand)  OBJECTIVE:   HAND DOMINANCE: Right   FUNCTIONAL OUTCOME MEASURES: FOTO: 48  UUPPER EXTREMITY ROM      Active ROM Right eval Left Eval WNL Right 12/11/21 Right 01/23/2022 Right 03/15/2022 Right 04/19/2022  Shoulder flexion 90 (135)   95 (110) 95 97 sitting 87 sitting  Shoulder abduction 95 (110)    80 (90) 95 96 sitting 86 sitting  Wrist flex     54 60 62 62  Wrist ext     41 45 48 50    UPPER EXTREMITY MMT:      MMT Right eval Left Eval 5/5 Right 12/11/21 Right 03/15/22 Right 04/19/2022  Shoulder flexion 3-   -3 3- 3-  Shoulder abduction 3-   -3 3- 3-  Shoulder adduction          Shoulder extension          Shoulder internal rotation 3-        Shoulder external rotation 3-        Middle trapezius          Lower trapezius          Elbow flexion 4-   4 4+ 5  Elbow extension 4+   '5 5 5  '$ Wrist flexion 4   NT d/t pain 4 4+  Wrist extension 4-   NT d/t pain 4 4+  (Blank rows = not tested)   HAND FUNCTION: Eval:         Grip strength: Right: 14 lbs; Left: 58 lbs, Lateral pinch: Right: 7 lbs, Left: 19 lbs, and 3 point pinch: Right: 2 lbs, Left: 16 lbs 10th visit: Grip strength: Right: 16  lbs;                                Lateral pinch: Right: 9  lbs;                            3 point pinch: Right: 5 lbs 20th visit: Grip strength: Right: 20 lbs; Left: 70 lbs, Lateral pinch: Right: 10 lbs, Left: 20 lbs, and 3 point pinch: Right: 9 lbs, Left: 20 lbs  30th visit: Grip strength: Right: 18 lbs; Left: 70 lbs, Lateral pinch: Right: 12 lbs, Left: 20 lbs, and 3 point pinch: Right: 8 lbs, Left: 20 lbs  [id number] Recert: Grip strength: Right: 20 lbs; Left: 70 lbs, Lateral pinch: Right: 10 lbs, Left: 20 lbs, and 3 point pinch: Right: 8 lbs, Left: 20 lbs    Eval: COORDINATION: Finger Nose Finger test: difficult/lacks precision with reaching targets 9 Hole Peg test: Right: unable sec; Left: 27 sec Able to oppose 2nd and 3rd digits to thumb on R hand   10th visit 9 hole Peg test: Right: 11 min, 44 sec   20th visit:  9 hole Peg test: Right: 3 min 6 sec   30th visit:  9 hole Peg test: Right: 1 min 45 sec   [id number] Recert:  9 hole Peg test: Right:  1 min 30 sec     TODAY'S TREATMENT:   Neuromuscular re-education: Scatter pile with cards, cues to use right dominant, affected hand with  task.  Difficulty noted with picking up cards from flat surface and often has to pull cards closer to edge to pick them up.  Therapeutic Exercise: Pt seen for reaching tasks with use of shape tower from seated position with use of right hand and arm to move shapes from one level of reach to the next, 4 graduated levels, pt able to perform up to the 3rd level with some assist from therapist for the last few items to achieve the extended reaching height.  ADL:  Handwriting severe micrographia, name and practiced writing the letter of his first name, Gerald with lines drawn for patient to start at the top of the line to the bottom to manage spacing and size of letter formation. Instructed patient to practice at home on lined paper with emphasis on letter size.    PATIENT EDUCATION: Education details: Typing strategies for working at home.  Person educated: pt/daughter Education method: Explanation and Verbal cues Education comprehension: verbalized understanding  HOME EXERCISE PROGRAM: Theraputty, hand writing skills, table slides  GOALS: Goals reviewed with patient? Yes   SHORT TERM GOALS: Target date 05/31/2022       Pt will be indep to perform HEP for increasing strength and coordination throughout RUE.  Baseline: Not yet initiated; 10th: putty given but pt reports limited use, instructed in table slides and self PROM for R wrist/digits. 20th:  Pt performing exercises but requires continual upgrades and instruction 30th: Pt performing exercises but requires continual upgrades, modifications, and instruction as needed 04/19/2022: Independent with HEPs. Will continue to upgrade as indicated. 40th visit: Independent Goal status: ongoing   2.  Pt will manage clothing fasteners with extra time, using RUE as an assist  Baseline: unable; pt has elastic laces and wears elastic waisted pants; 10th: pt uses R hand as an assist to zip and button jeans with extra time; not yet tried small buttons on a  shirt.  Pt continues to use elastic laces on shoes. 20th:  Improving with manipulation skills but still using elastic laces, able to button pants and shirt with increased time and effort. 30th: Pt. Is able to fasten pant fasteners, and button pants efficiently, ans independently. Goal status: met   LONG TERM GOALS: Target date:07/12/2022   Pt will increase FOTO score to 55 or better to indicate improved performance with daily tasks.  Baseline: 48; 10th: 48; 20th: score:42 30th visit: 64, 40th visit: 65 Goal status: Achieved   2.  Pt will increase R grip strength by 10 or more lbs to enable pt to hold and carry light ADL supplies without dropping.  Baseline: R grip 14#, L 58#; 10th visit: R grip 16; 20th visit: 20# 30th: 18#, Pt. Continues to have difficulty holding supplies securely. 04/19/2022: R: 20# 40th visit: Pt. Continues to have difficulty holding supplies securely Goal status: ongoing   3.  Pt will increase RUE strength to be able to engage RUE into ADLs at least 50% of the time. Baseline: Pt using L non-dominant arm to manage ADLs; 10th visit: Son reports he constantly sees pt attempt to use his R arm for daily tasks, but continues to be limited and still must use LUE for most tasks (see chart for RUE MMT) 20th:  continues to improve on strength and is working on consistency of use. 30th visit: Pt. Continues to  use the right hand during daily ADL, and IADL tasks. 04/19/2022: RUE strength continues to be limited. Pt. Is in the process of having his persistent arm pain assessed by an orthopedic physician. Pt. Is consistently engaging his right hand during ADLs more than  50% of the time.  Goal status: Partially met. Goal deferred, and new goal added for Right shoulder ROM    4.  Pt will complete 9 hole peg test on the R in 1 min or less to work towards ability to use R hand to pick up small ADL supplies.  Baseline: Pt can remove a peg but can not pick one up; 10th visit: R 9 hole completion  11 min 44 sec; 20th visit:   Pt improved to 3 mins and 6 secs, nearing goal. 30 visit: 1 min. & 45 sec. 04/19/2022: 1 min. & 30 sec. 40th visit: Pt. Continues to present with limited [facility] skill, and difficulty manipulating small objects. Goal status: Ongoing   5.  Pt will improve right pinch  strength to be able to open a tab on a can of soup with modified independence.  Baseline: Right lateral pinch 10#, 3pt. Pinch 8#. Pt. is unable to open, and pull the tab on a can of soup. 40th visit: Pt.  Has difficulty opening, and pulling the tab on a can of soup Goal status: Ongoing  6. Pt. will increase right shoulder ROM by 10 degrees to improve functional reach during ADLs, and IADL. Baseline: Right shoulder AROM flexion in sitting; 87, Abduction: 86; 40th visit: Right shoulder flexion in limited. New diagnosis for frozen shoulder, received an injection yesterday. Pt. reports having a good response with decreased pain following. Goal status: Ongoing  ASSESSMENT:  CLINICAL IMPRESSION: Pt denies any shoulder pain this date, focused on reaching patterns with use of graduated shape tower, able to achieve reach with 3 of 4 levels.  Pt required some guiding assistance from therapist with a few repetitions with fatigue.  Pt demonstrating ability to hold pen in right hand and attempt to formulate name however demonstrates increased micrographia.  Responded well today to use of lined paper and cues to go from top to bottom of line. Pt with some difficulty picking up cards from flat surface.  He has continued to progress well and working on refining increased functional use of right UE for daily tasks.      PERFORMANCE DEFICITS in functional skills including ADLs, IADLs, coordination, dexterity, ROM, strength, pain, flexibility, FMC, GMC, mobility, balance, continence, decreased knowledge of use of DME, and UE functional use, cognitive skills including safety awareness, and psychosocial skills including.    IMPAIRMENTS are limiting patient from ADLs, IADLs, leisure, and social participation.   COMORBIDITIES may have co-morbidities  that affects occupational performance. Patient will benefit from skilled OT to address above impairments and improve overall function.  MODIFICATION OR ASSISTANCE TO COMPLETE EVALUATION: Min-Moderate modification of tasks or assist with assess necessary to complete an evaluation.  OT OCCUPATIONAL PROFILE AND HISTORY: Problem focused assessment: Including review of records relating to presenting problem.  CLINICAL DECISION MAKING: Moderate - several treatment options, min-mod task modification necessary  REHAB POTENTIAL: Good  EVALUATION COMPLEXITY: Moderate    PLAN: OT FREQUENCY: 2x/week  OT DURATION: 12 weeks  PLANNED INTERVENTIONS: self care/ADL training, therapeutic exercise, therapeutic activity, neuromuscular re-education, manual therapy, passive range of motion, balance training, functional mobility training, moist heat, cryotherapy, patient/family education, cognitive remediation/compensation, energy conservation, coping strategies training, and DME and/or AE instructions  RECOMMENDED  OTHER SERVICES: N/A  CONSULTED AND AGREED WITH PLAN OF CARE: Patient and family member/caregiver  PLAN FOR NEXT SESSION: HEP progression, neuro re-ed, therapeutic exercises   Gerald Bauer T Brady Schiller, OTR/L, CLT 29/Feb/2024 20:19 pm

## 2022-05-24 NOTE — Therapy (Signed)
OUTPATIENT SPEECH LANGUAGE PATHOLOGY TREATMENT NOTE   Patient Name: Gerald Bauer MRN: [id number] DOB:[birth year], 77 y.o., male Today's Date: 05/24/2022  PCP: [name], PA-C Referring Provider:  [name], PA-C  END OF SESSION:   End of Session - 05/24/22 0959     Visit Number 69    Number of Visits 42    Date for SLP Re-Evaluation 07/24/22    SLP Start Time 0900    SLP Stop Time  1000    SLP Time Calculation (min) 60 min    Activity Tolerance Patient tolerated treatment well             Past Medical History:  Diagnosis Date   Acid reflux    Aortic atherosclerosis ([place])    a. Noted on CT Abd in 2012.   Arthritis    hands   Carotid atherosclerosis    a. 02/2018 U/S: RICA 123456, LICA 123456; b. [id number] MRA Head/Neck: near complete occlusion L ICA and L MCA. Patent RICA.   Diabetes mellitus without complication (HCC)    Diastolic dysfunction    a. 09/2021 Echo: EF 55-60%, no rwma, GrI DD, nl RV fxn, RVSP 14.49mHg.   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 09/2021 MRI/MRA Brain: Acute L MCA distribution infarct. Near complete occlusion L ICA and MCA.   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   Onychomycosis of multiple toenails with type 2 diabetes mellitus ([place] 07/19/2016   Carotid stenosis 03/13/2016   Hyperlipidemia associated with type 2 diabetes mellitus ([facility]    Hypertension associated with type 2 diabetes mellitus ([place]    Personal history of prostate cancer    Carotid atherosclerosis    GERD (gastroesophageal reflux disease) 08/20/2013    ONSET DATE: 10/05/2021    REFERRING DIAG: Cerebral infarction due to unspecified occlusion of left middle cerebral artery   THERAPY DIAG:  Verbal apraxia Verbal apraxia   Rationale for Evaluation and Treatment Rehabilitation   SUBJECTIVE STATEMENT:           Pt retrieved his tapping stick from his pocket Pt accompanied by: daughter-in-law   PERTINENT HISTORY: Pt  is a 89year old male with history of left-sided carotid stenosis, non-insulin-dependent diabetes mellitus type 2, PAD, GERD, mild COPD, history of tobacco use, hyperlipidemia, hypertension, who [facility], expressive aphasia, and R sided weakness and found to have left MCA CVA. [facility] Advanced to dysphagia 3/thin liquids by cup by time of d/c from CIR. 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 Difficult to fully assess cognition given severity of linguistic impairment; however, does present with decreased safety awareness, diminished problem-solving, emergent awareness, and mild impulsivity with movement."     DIAGNOSTIC FINDINGS: MBS 10/19/21: "Pt presents with overall mild oropharyngeal dysphagia. Oral phase c/b piecemeal deglutition, decreased mastication, decreased bolus cohesion, premature spillage, and weak lingual manipulation. Pharyngeal phase c/b reduced pharyngeal peristalsis + reduced BOT approximation to PPW, which  results in mild to moderate vallecular and pyriform sinuses residuals (vallecula > pyriform sinuses for solids, pyriform sinuses > vallecula for liquids). No penetration nor aspiration appreciated with thin liquid via cup, Dysphagia 1 (puree), Dysphagia 2 (chopped), Dysphagia 3 (mechanical soft), or with 13 mm Barium tablet placed in puree. Once instance of sensed aspiration with intake of large bolus of thin liquid via straw due to swallow initiation delay to pyriform sinuses and mistiming of swallow-breath cycle; despite strong reflexive cough aspirant was not fully cleared from trachea. Pt's level of swallow initiation was noted to vary with liquid consumption (over base of epiglottis and at pyriform sinuses) with level of swallow initiation for solids to be consistent at the vallecula. Pharyngeal stasis was partially cleared with reflexive secondary swallow." MRI brain 10/05/21: 1. 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 No associated hemorrhage or significant regional mass effect. 2. Loss of normal flow voids throughout the left ICA and MCA, consistent with slow flow and/or occlusion. 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 3. Underlying mild chronic microvascular ischemic disease. Tiny remote left ACA distribution infarct.   PAIN:  Are you having pain? No   PATIENT GOALS talk better   OBJECTIVE:    TODAY'S TREATMENT:     Armed forces operational officer facilitated conversational turn-taking and use of intelligibility strategies. Patient used tapping stick, slowing rate, and repetition to repair conversation breakdowns with rare min cues. Overall intelligibility for less familiar listener 80% in conversation. Pt appeared fatigued at end of session; pt agreed with this.    PATIENT EDUCATION: Education details: using strategies for intelligible speech, taking breaks when feeling fatigued  Person educated: Patient   Education method: Explanation Education comprehension: verbalized understanding and needs further education     GOALS: Goals reviewed with patient? Yes   SHORT TERM GOALS: Target date: 10 sessions   Patient will participate in clinical assessment of swallow function with goals added as needed. Baseline: Goal status: MET   2.  Pt will communicate emergency information 100% accuracy using visual aid if necessary.  Baseline:  Goal status: MET   3.  Pt will approximate personally relevant words and phrases >80% accuracy using script training and min visual cues for apraxia. Baseline:  Goal status: MET   4.  Pt will generate at least 4 descriptors of target word 80% of the time using semantic feature analysis to improve abilities in wordfinding and resolving communication breakdowns.  Baseline:  Goal status: MET   5.  Pt will complete HEP for dysphagia with rare min A. Baseline:  Goal status: MET   6.  Pt will generate sentences using personally relevant words list for >80% intelligibility. Baseline: 50% intelligibility Goal status: MET   7.  Pt will improve expressive language skills by taking 4-6 turns in simple conversation, with supported conversation strategies if needed. Baseline:  Goal status: MET 8.  Pt will generate sentences moderately complex sentences 8-10 words >80% intelligibility, with self-correction/multimodal communication  allowed. Baseline: 50% intelligibility Goal status: MET   9.  Patient will use intelligibility strategies and script training for 4-6 turn telephone exchange with family member or friend. Baseline:  Goal status: MET  10.  Pt will demonstrate operational competence using communication device by independently powering on/off his device, navigating forward/back and selecting icons. Baseline:  Goal status: DEFERRED   11.  Pt will answer simple questions using AAC device to provide information re: emotional state, pain, and  preferences. Baseline:  Goal status: DEFERRED   12.  Pt will use AAC in social exchange of 2-4 turns to share and request information.  Baseline:  Goal status: DEFERRED  13.  Pt will achieve 80% intelligibility in 6-8 turn exchange with less familiar listener or unknown context. Baseline: by visit 44 Goal status: ONGOING  14.  Pt will monitor for signs of listener comprehension at sentence level and initiate repair when breakdowns occur in 80% of opportunities. Baseline: by visit 44 Goal status: ONGOING        LONG TERM GOALS: Target date: 07/24/2022   Pt will engage in 10-12 minutes simple-mod complex conversation re: topic of interest with supported conversation, aphasia compensations.  Baseline: 01/25/22: 5 turn exchange. Goal renewed 01/25/22, achieved 5-8 min 04/26/22 and goal revised.  Goal status: REVISED   2.  Pt will ID and attempt repair of communication breakdowns >90% of the time for less familiar listener.    Baseline: 01/25/22: requires occasional mod cues to initiate correction; renewed 01/25/22, achieved 04/26/22 and revised for less-familiar listener Goal status: REVISED   3.  Pt/family will demonstrate knowledge of community resources and activities to support language/communication. Baseline: 01/25/22: referral completed to TAP; pt has yet to register for group, renewed 01/25/22 Goal status: ACHIEVED   4.  Pt will demonstrate use of swallowing precautions independently with s/sx aspiration <5% of trials. Baseline: 01/25/22: no overt s/sx when sipping water in sessions, pt and family report no issues at home with meals Goal status: DEFERRED       ASSESSMENT:   CLINICAL IMPRESSION: Patient continues with aphasia and verbal apraxia. Intelligibility is improving at conversation level; pt has increased independence in usage of a tapping stick which helps him focus on slowing down his rate of speech. Patient remains motivated to improve his speech for conversation with  family and friends; while he may benefit from communication device/ AAC, patient prefers to focus on verbal communication. Due to progress, have reduced frequency to 1x per week. Anticipate pt will meet LTGs and be ready for d/c by end of this cert period; continue training use of strategies in conversation for consistency and carryover. I recommend skilled ST to improve pt's language and motor speech skills to improve communication and reduce frustration.    OBJECTIVE IMPAIRMENTS include expressive language, receptive language, aphasia, apraxia, dysarthria, and dysphagia. These impairments are limiting patient from managing medications, managing appointments, managing finances, household responsibilities, ADLs/IADLs, effectively communicating at home and in community, and safety when swallowing. Factors affecting potential to achieve goals and functional outcome are cooperation/participation level; pt appears highly motivated with good family support. Patient will benefit from skilled SLP services to address above impairments and improve overall function.   REHAB POTENTIAL: Excellent   PLAN: SLP FREQUENCY: 2x/week   SLP DURATION: 12 weeks   PLANNED INTERVENTIONS: Aspiration precaution training, Pharyngeal strengthening exercises, Diet toleration management , Language facilitation, Environmental controls, Trials of upgraded texture/liquids, Cognitive reorganization, Internal/external aids, Functional tasks, Multimodal communication approach, SLP instruction and feedback,  Compensatory strategies, and Patient/family education  [name], [place], CCC-SLP Speech-Language Pathologist [phone]  [name], [place] 05/24/2022, 10:02 AM  [place] at [facility] [place], [place], [address] Phone: [phone]   Fax:  [phone]

## 2022-05-29 ENCOUNTER — Ambulatory Visit: Payer: Medicare Other

## 2022-05-29 ENCOUNTER — Ambulatory Visit: Payer: Medicare Other | Admitting: Occupational Therapy

## 2022-05-29 ENCOUNTER — Ambulatory Visit: Payer: Medicare Other | Admitting: Speech Pathology

## 2022-05-31 ENCOUNTER — Ambulatory Visit: Payer: Medicare Other | Attending: Physical Medicine and Rehabilitation | Admitting: Speech Pathology

## 2022-05-31 ENCOUNTER — Encounter: Payer: Self-pay | Admitting: Speech Pathology

## 2022-05-31 ENCOUNTER — Ambulatory Visit: Payer: Medicare Other

## 2022-05-31 ENCOUNTER — Ambulatory Visit: Payer: Medicare Other | Admitting: Occupational Therapy

## 2022-05-31 DIAGNOSIS — M6281 Muscle weakness (generalized): Secondary | ICD-10-CM

## 2022-05-31 DIAGNOSIS — R4701 Aphasia: Secondary | ICD-10-CM | POA: Insufficient documentation

## 2022-05-31 DIAGNOSIS — R41841 Cognitive communication deficit: Secondary | ICD-10-CM | POA: Insufficient documentation

## 2022-05-31 DIAGNOSIS — R482 Apraxia: Secondary | ICD-10-CM | POA: Insufficient documentation

## 2022-05-31 DIAGNOSIS — R278 Other lack of coordination: Secondary | ICD-10-CM | POA: Diagnosis present

## 2022-05-31 DIAGNOSIS — M25611 Stiffness of right shoulder, not elsewhere classified: Secondary | ICD-10-CM | POA: Diagnosis present

## 2022-05-31 DIAGNOSIS — I63512 Cerebral infarction due to unspecified occlusion or stenosis of left middle cerebral artery: Secondary | ICD-10-CM | POA: Diagnosis present

## 2022-05-31 NOTE — Therapy (Signed)
OUTPATIENT SPEECH LANGUAGE PATHOLOGY TREATMENT NOTE   Patient Name: Gerald Bauer MRN: [id number] DOB:11/23/1945, 77 y.o., male Today's Date: 05/31/2022  PCP: [name], PA-C Referring Provider:  [name], PA-C  END OF SESSION:   End of Session - 05/31/22 1305     Visit Number 48    Number of Visits 9    Date for SLP Re-Evaluation 07/24/22    SLP Start Time 0900    SLP Stop Time  1000    SLP Time Calculation (min) 60 min    Activity Tolerance Patient tolerated treatment well             Past Medical History:  Diagnosis Date   Acid reflux    Aortic atherosclerosis ([place])    a. Noted on CT Abd in 2012.   Arthritis    hands   Carotid atherosclerosis    a. 02/2018 U/S: RICA 123456, LICA 123456; b. [id number] MRA Head/Neck: near complete occlusion L ICA and L MCA. Patent RICA.   Diabetes mellitus without complication (HCC)    Diastolic dysfunction    a. 09/2021 Echo: EF 55-60%, no rwma, GrI DD, nl RV fxn, RVSP 14.66mHg.   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 09/2021 MRI/MRA Brain: Acute L MCA distribution infarct. Near complete occlusion L ICA and MCA.   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   Onychomycosis of multiple toenails with type 2 diabetes mellitus (HR[place] 07/19/2016   Carotid stenosis 03/13/2016   Hyperlipidemia associated with type 2 diabetes mellitus ([place]    Hypertension associated with type 2 diabetes mellitus (HM[place]    Personal history of prostate cancer    Carotid atherosclerosis    GERD (gastroesophageal reflux disease) 08/20/2013    ONSET DATE: 10/05/2021    REFERRING DIAG: Cerebral infarction due to unspecified occlusion of left middle cerebral artery   THERAPY DIAG:  Verbal apraxia Verbal apraxia   Rationale for Evaluation and Treatment Rehabilitation   SUBJECTIVE STATEMENT:           Pt agreed that he has met most of his goals and feels ready to discharge soon.  Pt accompanied by: daughter-in-law   PERTINENT HISTORY: Pt  is a 89year old male with history of left-sided carotid stenosis, non-insulin-dependent diabetes mellitus type 2, PAD, GERD, mild COPD, history of tobacco use, hyperlipidemia, hypertension, who [facility], expressive aphasia, and R sided weakness and found to have left MCA CVA. [facility] Advanced to dysphagia 3/thin liquids by cup by time of d/c from  CIR. 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 Difficult to fully assess cognition given severity of linguistic impairment; however, does present with decreased safety awareness, diminished problem-solving, emergent awareness, and mild impulsivity with movement."     DIAGNOSTIC FINDINGS: MBS 10/19/21: "Pt presents with overall mild oropharyngeal dysphagia. Oral phase c/b piecemeal deglutition, decreased mastication, decreased bolus cohesion, premature spillage, and weak lingual manipulation. Pharyngeal phase c/b reduced pharyngeal peristalsis + reduced  BOT approximation to PPW, which results in mild to moderate vallecular and pyriform sinuses residuals (vallecula > pyriform sinuses for solids, pyriform sinuses > vallecula for liquids). No penetration nor aspiration appreciated with thin liquid via cup, Dysphagia 1 (puree), Dysphagia 2 (chopped), Dysphagia 3 (mechanical soft), or with 13 mm Barium tablet placed in puree. Once instance of sensed aspiration with intake of large bolus of thin liquid via straw due to swallow initiation delay to pyriform sinuses and mistiming of swallow-breath cycle; despite strong reflexive cough aspirant was not fully cleared from trachea. Pt's level of swallow initiation was noted to vary with liquid consumption (over base of epiglottis and at pyriform sinuses) with level of swallow initiation for solids to be consistent at the vallecula. Pharyngeal stasis was partially cleared with reflexive secondary swallow." MRI brain 10/05/21: 1. 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 No associated hemorrhage or significant regional mass effect. 2. Loss of normal flow voids throughout the left ICA and MCA, consistent with slow flow and/or occlusion. 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 3. Underlying mild chronic microvascular ischemic disease. Tiny remote left ACA distribution infarct.   PAIN:  Are you having pain? No   PATIENT GOALS talk better   OBJECTIVE:    TODAY'S TREATMENT:    Clinician facilitated conversation about goals met by patient and feelings regarding discharge within the next few sessions. Patient agreed that he has met a majority of his goals, and agreed that he will be ready for d/c in next 2-4 sessions. Armed forces operational officer facilitated conversational turn-taking, use of intelligibility strategies, and monitoring for signs of listener comprehension at sentence level and initiate repairs when breakdowns occur. Patient used  tapping stick, slowing rate, and repetition to repair conversation breakdowns with rare min cues. Overall intelligibility for less familiar listener 80% in conversational turn-taking task and taboo.    PATIENT EDUCATION: Education details: Being aware/monitoring for signs of listener comprehension at sentence level and initiating repairs Person educated: Patient  Education method: Explanation Education comprehension: verbalized understanding and needs further education     GOALS: Goals reviewed with patient? Yes   SHORT TERM GOALS: Target date: 10 sessions   Patient will participate in clinical assessment of swallow function with goals added as needed. Baseline: Goal status: MET   2.  Pt will communicate emergency information 100% accuracy using visual aid if necessary.  Baseline:  Goal status: MET   3.  Pt will approximate personally relevant words and phrases >80% accuracy using script training and min visual cues for apraxia. Baseline:  Goal status: MET   4.  Pt will generate at least 4 descriptors of target word 80% of the time using semantic feature analysis to improve abilities in wordfinding and resolving communication breakdowns.  Baseline:  Goal status: MET   5.  Pt will complete HEP for dysphagia with rare min A. Baseline:  Goal status: MET   6.  Pt will generate  sentences using personally relevant words list for >80% intelligibility. Baseline: 50% intelligibility Goal status: MET   7.  Pt will improve expressive language skills by taking 4-6 turns in simple conversation, with supported conversation strategies if needed. Baseline:  Goal status: MET 8.  Pt will generate sentences moderately complex sentences 8-10 words >80% intelligibility, with self-correction/multimodal communication allowed. Baseline: 50% intelligibility Goal status: MET   9.  Patient will use intelligibility strategies and script training for 4-6 turn telephone exchange with family member or  friend. Baseline:  Goal status: MET  10.  Pt will demonstrate operational competence using communication device by independently powering on/off his device, navigating forward/back and selecting icons. Baseline:  Goal status: DEFERRED   11.  Pt will answer simple questions using AAC device to provide information re: emotional state, pain, and preferences. Baseline:  Goal status: DEFERRED   12.  Pt will use AAC in social exchange of 2-4 turns to share and request information.  Baseline:  Goal status: DEFERRED  13.  Pt will achieve 80% intelligibility in 6-8 turn exchange with less familiar listener or unknown context. Baseline: by visit 44 Goal status: ONGOING  14.  Pt will monitor for signs of listener comprehension at sentence level and initiate repair when breakdowns occur in 80% of opportunities. Baseline: by visit 44 Goal status: ONGOING        LONG TERM GOALS: Target date: 07/24/2022   Pt will engage in 10-12 minutes simple-mod complex conversation re: topic of interest with supported conversation, aphasia compensations.  Baseline: 01/25/22: 5 turn exchange. Goal renewed 01/25/22, achieved 5-8 min 04/26/22 and goal revised.  Goal status: REVISED   2.  Pt will ID and attempt repair of communication breakdowns >90% of the time for less familiar listener.    Baseline: 01/25/22: requires occasional mod cues to initiate correction; renewed 01/25/22, achieved 04/26/22 and revised for less-familiar listener Goal status: REVISED   3.  Pt/family will demonstrate knowledge of community resources and activities to support language/communication. Baseline: 01/25/22: referral completed to TAP; pt has yet to register for group, renewed 01/25/22 Goal status: ACHIEVED   4.  Pt will demonstrate use of swallowing precautions independently with s/sx aspiration <5% of trials. Baseline: 01/25/22: no overt s/sx when sipping water in sessions, pt and family report no issues at home with meals Goal  status: DEFERRED       ASSESSMENT:   CLINICAL IMPRESSION: Patient continues with aphasia and verbal apraxia. Intelligibility is improving at conversation level; pt has increased independence in usage of a tapping stick which helps him focus on slowing down his rate of speech. Patient remains motivated to improve his speech for conversation with family and friends; while he may benefit from communication device/ AAC, patient prefers to focus on verbal communication. Due to progress, have reduced frequency to 1x per week. Anticipate pt will meet LTGs and be ready for d/c in next 2-4 sessions; continue training use of strategies in conversation for consistency and carryover. I recommend skilled ST to improve pt's language and motor speech skills to improve communication and reduce frustration.    OBJECTIVE IMPAIRMENTS include expressive language, receptive language, aphasia, apraxia, dysarthria, and dysphagia. These impairments are limiting patient from managing medications, managing appointments, managing finances, household responsibilities, ADLs/IADLs, effectively communicating at home and in community, and safety when swallowing. Factors affecting potential to achieve goals and functional outcome are cooperation/participation level; pt appears highly motivated with good family support. Patient will benefit from skilled SLP services to address above  impairments and improve overall function.   REHAB POTENTIAL: Excellent   PLAN: SLP FREQUENCY: 2x/week   SLP DURATION: 12 weeks   PLANNED INTERVENTIONS: Aspiration precaution training, Pharyngeal strengthening exercises, Diet toleration management , Language facilitation, Environmental controls, Trials of upgraded texture/liquids, Cognitive reorganization, Internal/external aids, Functional tasks, Multimodal communication approach, SLP instruction and feedback, Compensatory strategies, and Patient/family education   Betsey Sossamon Planter,  [place] 05/31/2022, 1:09 PM  [place] at [facility] [place], [place], [address] Phone: [phone]   Fax:  [phone]

## 2022-06-01 ENCOUNTER — Encounter: Payer: Self-pay | Admitting: Occupational Therapy

## 2022-06-01 NOTE — Therapy (Signed)
OCCUPATIONAL THERAPY TREATMENT NOTE MRN: [id number] DOB:02-Jul-1945, 77 y.o., male 06/01/2022   PCP: Gerald Alpha, PA REFERRING PROVIDER: Reesa Bauer    OT End of Session - 06/01/22 2017     Visit Number 79    Number of Visits 51    Date for OT Re-Evaluation 07/12/22    OT Start Time 1015    OT Stop Time 1100    OT Time Calculation (min) 45 min    Activity Tolerance Patient tolerated treatment well    Behavior During Therapy WFL for tasks assessed/performed                        Past Medical History:  Diagnosis Date   Acid reflux    Aortic atherosclerosis ([place])    a. Noted on CT Abd in 2012.   Arthritis    hands   Carotid atherosclerosis    a. 02/2018 U/S: RICA 123456, LICA 123456; b. [id number] MRA Head/Neck: near complete occlusion L ICA and L MCA. Patent RICA.   Diabetes mellitus without complication (HCC)    Diastolic dysfunction    a. 09/2021 Echo: EF 55-60%, no rwma, GrI DD, nl RV fxn, RVSP 14.20mHg.   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 09/2021 MRI/MRA Brain: Acute L MCA distribution infarct. Near complete occlusion L ICA and MCA.   Tachycardia    Tinea pedis of both feet 01/10/2017   Past Surgical History:  Procedure Laterality Date   APPENDECTOMY  1973   CHOLECYSTECTOMY  2013   PROSTATECTOMY  2012   Patient Active Problem List   Diagnosis Date Noted   H/O prostatectomy 02/06/2022   Hemiparesis, aphasia, and dysphagia as late effects of stroke ([facility] 11/07/2021   Lower urinary tract symptoms (LUTS) 11/07/2021   Myalgia due to statin 02/21/2021   Type 2 diabetes mellitus without complication, without long-term current use of insulin ([facility] [place] 02/21/2021   Aortic  atherosclerosis ([facility] 04/27/2020   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   Onychomycosis of multiple toenails with type 2 diabetes mellitus ([facility] 07/19/2016   Carotid stenosis 03/13/2016   Hyperlipidemia associated with type 2 diabetes mellitus ([facility]    Hypertension associated with type 2 diabetes mellitus ([facility]    Personal history of prostate cancer    Carotid atherosclerosis    GERD (gastroesophageal reflux disease) 08/20/2013    ONSET DATE: 10/05/21  REFERRING DIAG: L MCA CVA  THERAPY DIAG:  Muscle weakness (generalized)  Other lack of coordination  Left middle cerebral artery stroke ([facility]  Rationale for Evaluation and Treatment Rehabilitation  SUBJECTIVE:   SUBJECTIVE STATEMENT:  No pain, reports he has been going on Sunday to Wednesday.  This week he will go home Friday and stay until Wednesday.  Pt reports he has been preparing food in the microwave such as instant potatoes and hot pockets.  He has cereal for breakfast, toast or grits.     PERTINENT HISTORY: Per chart, WDerius Latneyis a 77year old right-handed male with history of hypertension, hyperlipidemia, diabetes mellitus, prostate cancer/prostatectomy 2012, quit smoking 24 years ago.  [facility] right side weakness/facial droop and expressive aphasia.  Blood pressure 155/102.  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  No associated hemorrhage or significant mass effect.    PAIN:  Are you having pain? No reports of pain today  FALLS: Has patient fallen in last 6 months? Yes. Number of falls 3 since CVA  PLOF: Independent/retired Dealer  PATIENT GOALS : Pt points to his hand (wants to be able to use his hand)  OBJECTIVE:   HAND DOMINANCE: Right   FUNCTIONAL OUTCOME MEASURES: FOTO: 48  UUPPER EXTREMITY ROM      Active ROM Right eval Left Eval WNL Right 12/11/21 Right 01/23/2022 Right 03/15/2022 Right 04/19/2022  Shoulder flexion 90 (135)   95  (110) 95 97 sitting 87 sitting  Shoulder abduction 95 (110)   80 (90) 95 96 sitting 86 sitting  Wrist flex     54 60 62 62  Wrist ext     41 45 48 50    UPPER EXTREMITY MMT:      MMT Right eval Left Eval 5/5 Right 12/11/21 Right 03/15/22 Right 04/19/2022  Shoulder flexion 3-   -3 3- 3-  Shoulder abduction 3-   -3 3- 3-  Shoulder adduction          Shoulder extension          Shoulder internal rotation 3-        Shoulder external rotation 3-        Middle trapezius          Lower trapezius          Elbow flexion 4-   4 4+ 5  Elbow extension 4+   '5 5 5  '$ Wrist flexion 4   NT d/t pain 4 4+  Wrist extension 4-   NT d/t pain 4 4+  (Blank rows = not tested)   HAND FUNCTION: Eval:         Grip strength: Right: 14 lbs; Left: 58 lbs, Lateral pinch: Right: 7 lbs, Left: 19 lbs, and 3 point pinch: Right: 2 lbs, Left: 16 lbs 10th visit: Grip strength: Right: 16  lbs;                                Lateral pinch: Right: 9  lbs;                            3 point pinch: Right: 5 lbs 20th visit: Grip strength: Right: 20 lbs; Left: 70 lbs, Lateral pinch: Right: 10 lbs, Left: 20 lbs, and 3 point pinch: Right: 9 lbs, Left: 20 lbs  30th visit: Grip strength: Right: 18 lbs; Left: 70 lbs, Lateral pinch: Right: 12 lbs, Left: 20 lbs, and 3 point pinch: Right: 8 lbs, Left: 20 lbs  [id number] Recert: Grip strength: Right: 20 lbs; Left: 70 lbs, Lateral pinch: Right: 10 lbs, Left: 20 lbs, and 3 point pinch: Right: 8 lbs, Left: 20 lbs    Eval: COORDINATION: Finger Nose Finger test: difficult/lacks precision with reaching targets 9 Hole Peg test: Right: unable sec; Left: 27 sec Able to oppose 2nd and 3rd digits to thumb on R hand   10th visit 9 hole Peg test: Right: 11 min, 44 sec   20th visit:  9 hole Peg test: Right: 3 min 6 sec   30th visit:  9 hole Peg test: Right: 1 min 45 sec  [id number] Recert:  9 hole Peg test: Right: 1 min 30 sec     TODAY'S TREATMENT:   Neuromuscular re-education: Pt seen  for manipulation of small 1/4 inch sized perler beads to place onto small grid with tip to tip prehension pattern to place, removing with use of tweezers, cues for prehension patterns as well as cues for positioning of fingers onto tweezers for optimal efficient hand use.   Therapeutic Exercise: Pt seen for strengthening of digits with use of push pins and placing into moderate resistive bulletin board, difficulty noted at times with pushing in fully into board, cues for tip to tip prehension.  ADL:   Pt seen for focus on management of medication bottles opening and closing, picking up small beads the size of pills and placing back into the container.    PATIENT EDUCATION: Education details: prehension patterns Person educated: Counselling psychologist method: Merchandiser, retail cues Education comprehension: verbalized understanding  HOME EXERCISE PROGRAM: Theraputty, hand writing skills, table slides  GOALS: Goals reviewed with patient? Yes   SHORT TERM GOALS: Target date 05/31/2022       Pt will be indep to perform HEP for increasing strength and coordination throughout RUE.  Baseline: Not yet initiated; 10th: putty given but pt reports limited use, instructed in table slides and self PROM for R wrist/digits. 20th:  Pt performing exercises but requires continual upgrades and instruction 30th: Pt performing exercises but requires continual upgrades, modifications, and instruction as needed 04/19/2022: Independent with HEPs. Will continue to upgrade as indicated. 40th visit: Independent Goal status: ongoing   2.  Pt will manage clothing fasteners with extra time, using RUE as an assist  Baseline: unable; pt has elastic laces and wears elastic waisted pants; 10th: pt uses R hand as an assist to zip and button jeans with extra time; not yet tried small buttons on a shirt.  Pt continues to use elastic laces on shoes. 20th:  Improving with manipulation skills but still using elastic laces,  able to button pants and shirt with increased time and effort. 30th: Pt. Is able to fasten pant fasteners, and button pants efficiently, ans independently. Goal status: met   LONG TERM GOALS: Target date:07/12/2022   Pt will increase FOTO score to 55 or better to indicate improved performance with daily tasks.  Baseline: 48; 10th: 48; 20th: score:42 30th visit: 64, 40th visit: 65 Goal status: Achieved   2.  Pt will increase R grip strength by 10 or more lbs to enable pt to hold and carry light ADL supplies without dropping.  Baseline: R grip 14#, L 58#; 10th visit: R grip 16; 20th visit: 20# 30th: 18#, Pt. Continues to have difficulty holding supplies securely. 04/19/2022: R: 20# 40th visit: Pt. Continues to have difficulty holding supplies securely Goal status: ongoing   3.  Pt will increase RUE strength to be able to engage RUE into ADLs at least 50% of the time. Baseline: Pt using L non-dominant arm to manage ADLs; 10th visit: Son reports he constantly sees pt attempt to use his R arm for daily tasks, but continues to be limited and still must use LUE for most tasks (see chart for RUE MMT) 20th:  continues to improve on strength and is working on consistency of use. 30th visit: Pt. Continues to use the right hand during daily ADL, and IADL tasks. 04/19/2022: RUE strength continues to be limited. Pt. Is in the process of having his persistent arm pain assessed by an orthopedic physician. Pt.  Is consistently engaging his right hand during ADLs more than  50% of the time.  Goal status: Partially met. Goal deferred, and new goal added for Right shoulder ROM    4.  Pt will complete 9 hole peg test on the R in 1 min or less to work towards ability to use R hand to pick up small ADL supplies.  Baseline: Pt can remove a peg but can not pick one up; 10th visit: R 9 hole completion 11 min 44 sec; 20th visit:   Pt improved to 3 mins and 6 secs, nearing goal. 30 visit: 1 min. & 45 sec. 04/19/2022: 1 min. &  30 sec. 40th visit: Pt. Continues to present with limited [facility] skill, and difficulty manipulating small objects. Goal status: Ongoing   5.  Pt will improve right pinch  strength to be able to open a tab on a can of soup with modified independence.  Baseline: Right lateral pinch 10#, 3pt. Pinch 8#. Pt. is unable to open, and pull the tab on a can of soup. 40th visit: Pt.  Has difficulty opening, and pulling the tab on a can of soup Goal status: Ongoing  6. Pt. will increase right shoulder ROM by 10 degrees to improve functional reach during ADLs, and IADL. Baseline: Right shoulder AROM flexion in sitting; 87, Abduction: 86; 40th visit: Right shoulder flexion in limited. New diagnosis for frozen shoulder, received an injection yesterday. Pt. reports having a good response with decreased pain following. Goal status: Ongoing  ASSESSMENT:  CLINICAL IMPRESSION:  Pt has continued to make excellent progress with right hand function, now able to progress to picking up and manipulating 1/4 inch small perler beads to place into grid with emphasis on precision of movement patterns.  Pt demonstrates mild difficulty with finger strength to place pins in moderate resistive board.  Pt starting to stay longer periods of time at his house alone, making simple meals with use of microwave.  Continue to work towards goals in plan of care to maximize safety and independence in necessary daily tasks.    PERFORMANCE DEFICITS in functional skills including ADLs, IADLs, coordination, dexterity, ROM, strength, pain, flexibility, FMC, GMC, mobility, balance, continence, decreased knowledge of use of DME, and UE functional use, cognitive skills including safety awareness, and psychosocial skills including.   IMPAIRMENTS are limiting patient from ADLs, IADLs, leisure, and social participation.   COMORBIDITIES may have co-morbidities  that affects occupational performance. Patient will benefit from skilled OT to address above  impairments and improve overall function.  MODIFICATION OR ASSISTANCE TO COMPLETE EVALUATION: Min-Moderate modification of tasks or assist with assess necessary to complete an evaluation.  OT OCCUPATIONAL PROFILE AND HISTORY: Problem focused assessment: Including review of records relating to presenting problem.  CLINICAL DECISION MAKING: Moderate - several treatment options, min-mod task modification necessary  REHAB POTENTIAL: Good  EVALUATION COMPLEXITY: Moderate    PLAN: OT FREQUENCY: 2x/week  OT DURATION: 12 weeks  PLANNED INTERVENTIONS: self care/ADL training, therapeutic exercise, therapeutic activity, neuromuscular re-education, manual therapy, passive range of motion, balance training, functional mobility training, moist heat, cryotherapy, patient/family education, cognitive remediation/compensation, energy conservation, coping strategies training, and DME and/or AE instructions  RECOMMENDED OTHER SERVICES: N/A  CONSULTED AND AGREED WITH PLAN OF CARE: Patient and family member/caregiver  PLAN FOR NEXT SESSION: HEP progression, neuro re-ed, therapeutic exercises   Gerald Bauer, OTR/L, CLT 06/01/2022 20:29 pm

## 2022-06-05 ENCOUNTER — Ambulatory Visit: Payer: Medicare Other

## 2022-06-05 ENCOUNTER — Ambulatory Visit: Payer: Medicare Other | Admitting: Occupational Therapy

## 2022-06-05 ENCOUNTER — Ambulatory Visit: Payer: Medicare Other | Admitting: Speech Pathology

## 2022-06-07 ENCOUNTER — Encounter: Payer: Self-pay | Admitting: Occupational Therapy

## 2022-06-07 ENCOUNTER — Ambulatory Visit: Payer: Medicare Other | Admitting: Occupational Therapy

## 2022-06-07 ENCOUNTER — Ambulatory Visit: Payer: Medicare Other | Admitting: Speech Pathology

## 2022-06-07 ENCOUNTER — Ambulatory Visit: Payer: Medicare Other

## 2022-06-07 DIAGNOSIS — R278 Other lack of coordination: Secondary | ICD-10-CM

## 2022-06-07 DIAGNOSIS — M6281 Muscle weakness (generalized): Secondary | ICD-10-CM

## 2022-06-07 DIAGNOSIS — R4701 Aphasia: Secondary | ICD-10-CM

## 2022-06-07 DIAGNOSIS — R482 Apraxia: Secondary | ICD-10-CM

## 2022-06-07 DIAGNOSIS — M25611 Stiffness of right shoulder, not elsewhere classified: Secondary | ICD-10-CM

## 2022-06-07 NOTE — Therapy (Signed)
OCCUPATIONAL THERAPY TREATMENT NOTE MRN: [id number] DOB:11/21/1945, 77 y.o., male 06/07/2022   PCP: Threasa Alpha, PA REFERRING PROVIDER: Reesa Chew    OT End of Session - 06/07/22 0956     Visit Number 48    Number of Visits 46    Date for OT Re-Evaluation 07/12/22    OT Start Time 1000    OT Stop Time 1045    OT Time Calculation (min) 45 min    Activity Tolerance Patient tolerated treatment well    Behavior During Therapy WFL for tasks assessed/performed                        Past Medical History:  Diagnosis Date   Acid reflux    Aortic atherosclerosis ([place])    a. Noted on CT Abd in 2012.   Arthritis    hands   Carotid atherosclerosis    a. 02/2018 U/S: RICA 123456, LICA 123456; b. [id number] MRA Head/Neck: near complete occlusion L ICA and L MCA. Patent RICA.   Diabetes mellitus without complication (HCC)    Diastolic dysfunction    a. 09/2021 Echo: EF 55-60%, no rwma, GrI DD, nl RV fxn, RVSP 14.63mHg.   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 09/2021 MRI/MRA Brain: Acute L MCA distribution infarct. Near complete occlusion L ICA and MCA.   Tachycardia    Tinea pedis of both feet 01/10/2017   Past Surgical History:  Procedure Laterality Date   APPENDECTOMY  1973   CHOLECYSTECTOMY  2013   PROSTATECTOMY  2012   Patient Active Problem List   Diagnosis Date Noted   H/O prostatectomy 02/06/2022   Hemiparesis, aphasia, and dysphagia as late effects of stroke ([facility] 11/07/2021   Lower urinary tract symptoms (LUTS) 11/07/2021   Myalgia due to statin 02/21/2021   Type 2 diabetes mellitus without complication, without long-term current use of insulin [place] 02/21/2021   [place]  atherosclerosis [place] 04/27/2020   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   Onychomycosis of multiple toenails with type 2 diabetes mellitus ([facility] 07/19/2016   Carotid stenosis 03/13/2016   Hyperlipidemia associated with type 2 diabetes mellitus ([facility]    Hypertension associated with type 2 diabetes mellitus ([place]    Personal history of prostate cancer    Carotid atherosclerosis    GERD (gastroesophageal reflux disease) 08/20/2013    ONSET DATE: 10/05/21  REFERRING DIAG: L MCA CVA  THERAPY DIAG:  Other lack of coordination  Muscle weakness (generalized)  Decreased ROM of right shoulder  Rationale for Evaluation and Treatment Rehabilitation  SUBJECTIVE:   SUBJECTIVE STATEMENT:  Pt reports staying home alone from Friday until Wednesday. Reports he takes his medicine in his pill box setup by family.   PERTINENT HISTORY: Per chart, Gerald Napieralskiis a 77year old right-handed male with history of hypertension, hyperlipidemia, diabetes mellitus, prostate cancer/prostatectomy 2012, quit smoking 24 years ago.  [facility] weakness/facial droop and expressive aphasia.  Blood pressure 155/102.  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  No associated hemorrhage or significant mass effect.    PAIN:  Are  you having pain? No reports of pain today  FALLS: Has patient fallen in last 6 months? Yes. Number of falls 3 since CVA  PLOF: Independent/retired Dealer  PATIENT GOALS : Pt points to his hand (wants to be able to use his hand)  OBJECTIVE:   HAND DOMINANCE: Right   FUNCTIONAL OUTCOME MEASURES: FOTO: 48  UUPPER EXTREMITY ROM      Active ROM Right eval Left Eval WNL Right 12/11/21 Right 01/23/2022 Right 03/15/2022 Right 04/19/2022  Shoulder flexion 90 (135)   95 (110) 95 97 sitting 87 sitting  Shoulder abduction 95 (110)   80 (90) 95 96 sitting 86 sitting  Wrist flex     54 60 62 62  Wrist ext     41 45 48 50     UPPER EXTREMITY MMT:      MMT Right eval Left Eval 5/5 Right 12/11/21 Right 03/15/22 Right 04/19/2022  Shoulder flexion 3-   -3 3- 3-  Shoulder abduction 3-   -3 3- 3-  Shoulder adduction          Shoulder extension          Shoulder internal rotation 3-        Shoulder external rotation 3-        Middle trapezius          Lower trapezius          Elbow flexion 4-   4 4+ 5  Elbow extension 4+   '5 5 5  '$ Wrist flexion 4   NT d/t pain 4 4+  Wrist extension 4-   NT d/t pain 4 4+  (Blank rows = not tested)   HAND FUNCTION: Eval:         Grip strength: Right: 14 lbs; Left: 58 lbs, Lateral pinch: Right: 7 lbs, Left: 19 lbs, and 3 point pinch: Right: 2 lbs, Left: 16 lbs 10th visit: Grip strength: Right: 16  lbs;                                Lateral pinch: Right: 9  lbs;                            3 point pinch: Right: 5 lbs 20th visit: Grip strength: Right: 20 lbs; Left: 70 lbs, Lateral pinch: Right: 10 lbs, Left: 20 lbs, and 3 point pinch: Right: 9 lbs, Left: 20 lbs  30th visit: Grip strength: Right: 18 lbs; Left: 70 lbs, Lateral pinch: Right: 12 lbs, Left: 20 lbs, and 3 point pinch: Right: 8 lbs, Left: 20 lbs  [id number] Recert: Grip strength: Right: 20 lbs; Left: 70 lbs, Lateral pinch: Right: 10 lbs, Left: 20 lbs, and 3 point pinch: Right: 8 lbs, Left: 20 lbs    Eval: COORDINATION: Finger Nose Finger test: difficult/lacks precision with reaching targets 9 Hole Peg test: Right: unable sec; Left: 27 sec Able to oppose 2nd and 3rd digits to thumb on R hand   10th visit 9 hole Peg test: Right: 11 min, 44 sec   20th visit:  9 hole Peg test: Right: 3 min 6 sec   30th visit:  9 hole Peg test: Right: 1 min 45 sec   [id number] Recert:  9 hole Peg test: Right: 1 min 30 sec     TODAY'S TREATMENT:    Therapeutic Exercise: Pt seen for strengthening of digits with use  of green theraputty including pinch, removing beads, finger abduction, and thumb extension.  Pt completed 1.5lb digi-flex  for finger strengthening, cues for digit isolation. Green theraputty provided for updated HEP. Pt worked on Chief Operating Officer in the left hand for lateral, and 3pt. pinch using red, green, blue, and black resistive clips. Pt worked on placing the clips at various vertical and horizontal angles onto wooden dowel.    ADL:  Pt completed Pill Box Test with focus on following multi step instructions. Pt demonstrated deficits in self-monitoring. After reading medication label stating "take 1 tablet 3 times daily" pt placed beads in all 4 rows. Identified error with cues. Additional focus on management of medication bottles opening and closing, picking up small beads the size of pills and placing back into pill box using R hand.    PATIENT EDUCATION: Education details: prehension patterns Person educated: Counselling psychologist method: Merchandiser, retail cues Education comprehension: verbalized understanding  HOME EXERCISE PROGRAM: Theraputty, hand writing skills, table slides  GOALS: Goals reviewed with patient? Yes   SHORT TERM GOALS: Target date 05/31/2022       Pt will be indep to perform HEP for increasing strength and coordination throughout RUE.  Baseline: Not yet initiated; 10th: putty given but pt reports limited use, instructed in table slides and self PROM for R wrist/digits. 20th:  Pt performing exercises but requires continual upgrades and instruction 30th: Pt performing exercises but requires continual upgrades, modifications, and instruction as needed 04/19/2022: Independent with HEPs. Will continue to upgrade as indicated. 40th visit: Independent Goal status: ongoing   2.  Pt will manage clothing fasteners with extra time, using RUE as an assist  Baseline: unable; pt has elastic laces and wears elastic waisted pants; 10th: pt uses R hand as an assist to zip and button jeans with extra time; not yet tried small buttons on a shirt.  Pt continues to use elastic laces on  shoes. 20th:  Improving with manipulation skills but still using elastic laces, able to button pants and shirt with increased time and effort. 30th: Pt. Is able to fasten pant fasteners, and button pants efficiently, ans independently. Goal status: met   LONG TERM GOALS: Target date:07/12/2022   Pt will increase FOTO score to 55 or better to indicate improved performance with daily tasks.  Baseline: 48; 10th: 48; 20th: score:42 30th visit: 64, 40th visit: 65 Goal status: Achieved   2.  Pt will increase R grip strength by 10 or more lbs to enable pt to hold and carry light ADL supplies without dropping.  Baseline: R grip 14#, L 58#; 10th visit: R grip 16; 20th visit: 20# 30th: 18#, Pt. Continues to have difficulty holding supplies securely. 04/19/2022: R: 20# 40th visit: Pt. Continues to have difficulty holding supplies securely Goal status: ongoing   3.  Pt will increase RUE strength to be able to engage RUE into ADLs at least 50% of the time. Baseline: Pt using L non-dominant arm to manage ADLs; 10th visit: Son reports he constantly sees pt attempt to use his R arm for daily tasks, but continues to be limited and still must use LUE for most tasks (see chart for RUE MMT) 20th:  continues to improve on strength and is working on consistency of use. 30th visit: Pt. Continues to use the right hand during daily ADL, and IADL tasks. 04/19/2022: RUE strength continues to be limited. Pt. Is in the process of having his persistent arm pain assessed by an orthopedic physician. Pt.  Is consistently engaging his right hand during ADLs more than  50% of the time.  Goal status: Partially met. Goal deferred, and new goal added for Right shoulder ROM    4.  Pt will complete 9 hole peg test on the R in 1 min or less to work towards ability to use R hand to pick up small ADL supplies.  Baseline: Pt can remove a peg but can not pick one up; 10th visit: R 9 hole completion 11 min 44 sec; 20th visit:   Pt improved to  3 mins and 6 secs, nearing goal. 30 visit: 1 min. & 45 sec. 04/19/2022: 1 min. & 30 sec. 40th visit: Pt. Continues to present with limited [facility] skill, and difficulty manipulating small objects. Goal status: Ongoing   5.  Pt will improve right pinch  strength to be able to open a tab on a can of soup with modified independence.  Baseline: Right lateral pinch 10#, 3pt. Pinch 8#. Pt. is unable to open, and pull the tab on a can of soup. 40th visit: Pt.  Has difficulty opening, and pulling the tab on a can of soup Goal status: Ongoing  6. Pt. will increase right shoulder ROM by 10 degrees to improve functional reach during ADLs, and IADL. Baseline: Right shoulder AROM flexion in sitting; 87, Abduction: 86; 40th visit: Right shoulder flexion in limited. New diagnosis for frozen shoulder, received an injection yesterday. Pt. reports having a good response with decreased pain following. Goal status: Ongoing  ASSESSMENT:  CLINICAL IMPRESSION: Pt has continued to make excellent progress with right hand function and strength. Updated HEP and green theraputty provided with review of exercises. Continues to show deficits in executive function and self-monitoring to accurately compete pill box test. Pt starting to stay longer periods of time at his house alone, making simple meals with use of microwave.  Continue to work towards goals in plan of care to maximize safety and independence in necessary daily tasks.    PERFORMANCE DEFICITS in functional skills including ADLs, IADLs, coordination, dexterity, ROM, strength, pain, flexibility, FMC, GMC, mobility, balance, continence, decreased knowledge of use of DME, and UE functional use, cognitive skills including safety awareness, and psychosocial skills including.   IMPAIRMENTS are limiting patient from ADLs, IADLs, leisure, and social participation.   COMORBIDITIES may have co-morbidities  that affects occupational performance. Patient will benefit from skilled  OT to address above impairments and improve overall function.  MODIFICATION OR ASSISTANCE TO COMPLETE EVALUATION: Min-Moderate modification of tasks or assist with assess necessary to complete an evaluation.  OT OCCUPATIONAL PROFILE AND HISTORY: Problem focused assessment: Including review of records relating to presenting problem.  CLINICAL DECISION MAKING: Moderate - several treatment options, min-mod task modification necessary  REHAB POTENTIAL: Good  EVALUATION COMPLEXITY: Moderate    PLAN: OT FREQUENCY: 2x/week  OT DURATION: 12 weeks  PLANNED INTERVENTIONS: self care/ADL training, therapeutic exercise, therapeutic activity, neuromuscular re-education, manual therapy, passive range of motion, balance training, functional mobility training, moist heat, cryotherapy, patient/family education, cognitive remediation/compensation, energy conservation, coping strategies training, and DME and/or AE instructions  RECOMMENDED OTHER SERVICES: N/A  CONSULTED AND AGREED WITH PLAN OF CARE: Patient and family member/caregiver  PLAN FOR NEXT SESSION: HEP progression, neuro re-ed, therapeutic exercises   Dessie Coma, M.S. OTR/L  06/07/22, 10:49 AM  ascom [phone]

## 2022-06-07 NOTE — Therapy (Signed)
OUTPATIENT SPEECH LANGUAGE PATHOLOGY TREATMENT NOTE   Patient Name: Gerald Bauer MRN: [id number] DOB:[birth year], 77 y.o., male Today's Date: 06/07/2022  PCP: [name], PA-C Referring Provider:  [name], PA-C  END OF SESSION:   End of Session - 06/07/22 0902     Visit Number [id number]    Number of Visits 52    Date for SLP Re-Evaluation 07/24/22    SLP Start Time 0900    SLP Stop Time  1000    SLP Time Calculation (min) 60 min    Activity Tolerance Patient tolerated treatment well             Past Medical History:  Diagnosis Date   Acid reflux    Aortic atherosclerosis ([place])    a. Noted on CT Abd in 2012.   Arthritis    hands   Carotid atherosclerosis    a. 02/2018 U/S: RICA 123456, LICA 123456; b. [id number] MRA Head/Neck: near complete occlusion L ICA and L MCA. Patent RICA.   Diabetes mellitus without complication (HCC)    Diastolic dysfunction    a. 09/2021 Echo: EF 55-60%, no rwma, GrI DD, nl RV fxn, RVSP 14.36mHg.   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 09/2021 MRI/MRA Brain: Acute L MCA distribution infarct. Near complete occlusion L ICA and MCA.   Tachycardia    Tinea pedis of both feet 01/10/2017   Past Surgical History:  Procedure Laterality Date   APPENDECTOMY  1973   CHOLECYSTECTOMY  2013   PROSTATECTOMY  2012   Patient Active Problem List   Diagnosis Date Noted   H/O prostatectomy 02/06/2022   Hemiparesis, aphasia, and dysphagia as late effects of stroke ([facility] 11/07/2021   Lower urinary tract symptoms (LUTS) 11/07/2021   Myalgia due to statin 02/21/2021   Type 2 diabetes mellitus without complication, without long-term current use of insulin ([facility]  02/21/2021   Aortic atherosclerosis ([facility] 04/27/2020   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   Onychomycosis of multiple toenails with type 2 diabetes mellitus ([facility] 07/19/2016   Carotid stenosis 03/13/2016   Hyperlipidemia associated with type 2 diabetes mellitus ([facility]    Hypertension associated with type 2 diabetes mellitus ([facility]    Personal history of prostate cancer    Carotid atherosclerosis    GERD (gastroesophageal reflux disease) 08/20/2013    ONSET DATE: 10/05/2021    REFERRING DIAG: Cerebral infarction due to unspecified occlusion of left middle cerebral artery   THERAPY DIAG:  Verbal apraxia Verbal apraxia   Rationale for Evaluation and Treatment Rehabilitation   SUBJECTIVE STATEMENT:           "Good." Pt accompanied by: daughter-in-law   PERTINENT HISTORY: Pt  is a 89year old male with history of left-sided carotid stenosis, non-insulin-dependent diabetes mellitus type 2, PAD, GERD, mild COPD, history of tobacco use, hyperlipidemia, hypertension, [facility], expressive aphasia, and R sided weakness and found to have left MCA CVA. [facility]. Advanced to dysphagia 3/thin liquids by cup by time of d/c from CIR. 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 Difficult to fully assess cognition given severity of linguistic impairment; however, does present with decreased safety awareness, diminished problem-solving, emergent awareness, and mild impulsivity with movement."     DIAGNOSTIC FINDINGS: MBS 10/19/21: "Pt presents with overall mild oropharyngeal dysphagia. Oral phase c/b piecemeal deglutition, decreased mastication, decreased bolus cohesion, premature spillage, and weak lingual manipulation. Pharyngeal phase c/b reduced pharyngeal peristalsis + reduced BOT approximation to PPW, which results in mild to moderate vallecular  and pyriform sinuses residuals (vallecula > pyriform sinuses for solids, pyriform sinuses > vallecula for liquids). No penetration nor aspiration appreciated with thin liquid via cup, Dysphagia 1 (puree), Dysphagia 2 (chopped), Dysphagia 3 (mechanical soft), or with 13 mm Barium tablet placed in puree. Once instance of sensed aspiration with intake of large bolus of thin liquid via straw due to swallow initiation delay to pyriform sinuses and mistiming of swallow-breath cycle; despite strong reflexive cough aspirant was not fully cleared from trachea. Pt's level of swallow initiation was noted to vary with liquid consumption (over base of epiglottis and at pyriform sinuses) with level of swallow initiation for solids to be consistent at the vallecula. Pharyngeal stasis was partially cleared with reflexive secondary swallow." MRI brain 10/05/21: 1. 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 No associated hemorrhage or significant regional mass effect. 2. Loss of normal flow voids throughout the left ICA and MCA, consistent with slow flow and/or occlusion. 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 3. Underlying mild chronic microvascular ischemic disease. Tiny remote left ACA distribution infarct.   PAIN:  Are you having pain? No   PATIENT GOALS talk better   OBJECTIVE:    TODAY'S TREATMENT:     Administered Trailmaking and ACE-III for assessment of cognitive/language function. See scores below:  TRAIL MAKING TEST (TMT) Parts A & B Both parts of the Trail Making Test consist of 25 circles distributed over a sheet of paper. In Part A, the circles are numbered 1 - 25, and the patient should draw lines to connect the numbers in ascending order. In Part B, the circles include both numbers (1 - 13) and letters (A - L); as in Part A, the patient draws lines to connect the circles in an ascending pattern, but with the added task  of alternating between the numbers and letters (i.e., 1-A-2-B-3-C, etc.).   Results for both TMT A and B are reported as the number of seconds required to complete the task; therefore, higher scores reveal greater impairment.  Results: *Given dexterity due to right hemiparesis, as well as language impairments which impact scanning for letters sequentially, these results may not be reliable/consistent with interpretation. Trail A - DEFICIENT - 82 seconds (n=29 seconds; Deficient > 78 seconds) Trail B - DEFICIENT - 281 seconds (n= 75 seconds; Deficient > 273 seconds)  "TMTs have been shown to be significantly correlated with impaired driving on road tests by older drivers." "TMT-A provided the best utility for determining a range of scores (68-90 sec) for which additional road testing would be indicated in general practice settings."(Pap[place]atos GD, Deedra Ehrich, [address], [place], [place]. Clinical Utility of the Trail-Making Test as a Predictor of Driving Performance in Older Adults. [place] 2015 Nov;63(11):2358-64. doi: 10.1111/jgs.13776. Epub 2015 Oct 27. PMID: [id number]; [id number].)  Addenbrooke's Cognitive Examination - ACE III The Addenbrooke's Cognitive Examination-III (ACE-III) is a brief cognitive test that assesses five cognitive domains. The total score is  100 with higher scores indicating better cognitive functioning. Cut off scores of 88 and 82 are recommended for suspicion of dementia (88 has sensitivity of 1.00 and specificity of 0.96, 82 has sensitivity of 0.93 and specificity of 1.00). American Version A  Attention 12/18  Memory 12/26  Fluency 6/14  Language 22/26  Visuospatial 14/16  TOTAL ACE- III Score 66/100      PATIENT EDUCATION: Education details: using strategies for intelligible speech, taking breaks when feeling fatigued  Person educated: Patient  Education method: Explanation Education comprehension: verbalized understanding and needs further  education     GOALS: Goals reviewed with patient? Yes   SHORT TERM GOALS: Target date: 10 sessions   Patient will participate in clinical assessment of swallow function with goals added as needed. Baseline: Goal status: MET   2.  Pt will communicate emergency information 100% accuracy using visual aid if necessary.  Baseline:  Goal status: MET   3.  Pt will approximate personally relevant words and phrases >80% accuracy using script training and min visual cues for apraxia. Baseline:  Goal status: MET   4.  Pt will generate at least 4 descriptors of target word 80% of the time using semantic feature analysis to improve abilities in wordfinding and resolving communication breakdowns.  Baseline:  Goal status: MET   5.  Pt will complete HEP for dysphagia with rare min A. Baseline:  Goal status: MET   6.  Pt will generate sentences using personally relevant words list for >80% intelligibility. Baseline: 50% intelligibility Goal status: MET   7.  Pt will improve expressive language skills by taking 4-6 turns in simple conversation, with supported conversation strategies if needed. Baseline:  Goal status: MET 8.  Pt will generate sentences moderately complex sentences 8-10 words >80% intelligibility, with self-correction/multimodal communication allowed. Baseline: 50% intelligibility Goal status: MET   9.  Patient will use intelligibility strategies and script training for 4-6 turn telephone exchange with family member or friend. Baseline:  Goal status: MET  10.  Pt will demonstrate operational competence using communication device by independently powering on/off his device, navigating forward/back and selecting icons. Baseline:  Goal status: DEFERRED   11.  Pt will answer simple questions using AAC device to provide information re: emotional state, pain, and preferences. Baseline:  Goal status: DEFERRED   12.  Pt will use AAC in social exchange of 2-4 turns to share and  request information.  Baseline:  Goal status: DEFERRED  13.  Pt will achieve 80% intelligibility in 6-8 turn exchange with less familiar listener or unknown context. Baseline: by visit 44 Goal status: ONGOING  14.  Pt will monitor for signs of listener comprehension at sentence level and initiate repair when breakdowns occur in 80% of opportunities. Baseline: by visit 44 Goal status: ONGOING        LONG TERM GOALS: Target date: 07/24/2022   Pt will engage in 10-12 minutes simple-mod complex conversation re: topic of interest with supported conversation, aphasia compensations.  Baseline: 01/25/22: 5 turn exchange. Goal renewed 01/25/22, achieved 5-8 min 04/26/22 and goal revised.  Goal status: REVISED   2.  Pt will ID and attempt repair of communication breakdowns >90% of the time for less familiar listener.    Baseline: 01/25/22: requires occasional mod cues to initiate correction; renewed 01/25/22, achieved 04/26/22 and revised for less-familiar listener Goal status: REVISED   3.  Pt/family will demonstrate knowledge of community resources and activities to support language/communication. Baseline: 01/25/22: referral completed to [facility];  pt has yet to register for group, renewed 01/25/22 Goal status: ACHIEVED   4.  Pt will demonstrate use of swallowing precautions independently with s/sx aspiration <5% of trials. Baseline: 01/25/22: no overt s/sx when sipping water in sessions, pt and family report no issues at home with meals Goal status: DEFERRED       ASSESSMENT:   CLINICAL IMPRESSION: Patient continues with aphasia and verbal apraxia. Intelligibility is improving at conversation level; pt has increased independence in usage of a tapping stick which helps him focus on slowing down his rate of speech. Assessed cognition via ACE 3 and Trailmaking; language impairments and right hemiparesis contribute to errors, difficult to discern extent of cognitive impairment. Will complete additional  assessment of non-linguistic cognitive function next session, at which time d/c is planned. I recommend skilled ST to improve pt's language and motor speech skills to improve communication and reduce frustration.    OBJECTIVE IMPAIRMENTS include expressive language, receptive language, aphasia, apraxia, dysarthria, and dysphagia. These impairments are limiting patient from managing medications, managing appointments, managing finances, household responsibilities, ADLs/IADLs, effectively communicating at home and in community, and safety when swallowing. Factors affecting potential to achieve goals and functional outcome are cooperation/participation level; pt appears highly motivated with good family support. Patient will benefit from skilled SLP services to address above impairments and improve overall function.   REHAB POTENTIAL: Excellent   PLAN: SLP FREQUENCY: 2x/week   SLP DURATION: 12 weeks   PLANNED INTERVENTIONS: Aspiration precaution training, Pharyngeal strengthening exercises, Diet toleration management , Language facilitation, Environmental controls, Trials of upgraded texture/liquids, Cognitive reorganization, Internal/external aids, Functional tasks, Multimodal communication approach, SLP instruction and feedback, Compensatory strategies, and Patient/family education  [name], MS, CCC-SLP Speech-Language Pathologist [phone]  [name], [place] 06/07/2022, 9:03 AM  [place] at [facility] [place], [place], [address] Phone: [phone]   Fax:  [phone]

## 2022-06-12 ENCOUNTER — Ambulatory Visit: Payer: Medicare Other | Admitting: Speech Pathology

## 2022-06-12 ENCOUNTER — Ambulatory Visit: Payer: Medicare Other

## 2022-06-12 ENCOUNTER — Encounter: Payer: Medicare Other | Admitting: Occupational Therapy

## 2022-06-13 NOTE — Progress Notes (Unsigned)
Patient: Gerald Bauer Date of Birth: [birth year]  Reason for Visit: Follow up History from: Patient, daughter-in-law Primary Neurologist: Rexene Alberts   ASSESSMENT AND PLAN 77 y.o. year old male   6.  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  Completed inpatient and outpatient rehab.  Remains on DAPT aspirin 81 mg and Plavix 75 mg daily. He completed ST today, graduated. Last week he stayed back at his home for the 1st week, it went well. Feels back to normal, except for speech dysarthria. He is not driving. Before this stroke was completely independent. He denies any memory concerns. He came to see Dr. Rexene Alberts back in 2015, he doesn't recall, he came with his wife, she has since passed. He has trouble with time orientation, short term. DIL, [name] puts pills in his container. He doesn't want a memory test today.   HISTORY  12/07/21 Dr. Rexene Alberts: I saw patient, Gerald Bauer, as a new patient referral from the hospital after recent stroke.  The patient is accompanied by his son, [name], today.  Gerald Bauer is a 77 year old right-handed gentleman with an underlying medical history of hypertension, hyperlipidemia, prostate cancer, left carotid atherosclerosis, diabetes, smoking arthritis, reflux disease, and mildly overweight state, who presented to the emergency room or hospital ED on 10/05/2021 with right-sided weakness and facial droop as well as aphasia.  He was noted to have an  elevated blood pressure.  He is on Repatha.   He was in inpatient rehab from 10/09/2021 through 10/31/2021.  He is currently in outpatient rehab.  He is on baby aspirin and Plavix indefinitely.  I reviewed the rehab medical records as well as the inpatient records.  He was in the hospital from 10/05/2021 through 10/09/2021.  He had a head CT without contrast on 10/05/2021 and I reviewed the results: IMPRESSION: Mild hypoattenuation in the region of the inferior left frontal lobe and anterior left insula. This may be artifactual given suspected streak artifact in this region; however, if there is concern for left MCA territory infarct then recommend MRI for more sensitive evaluation. He had a brain MRI without contrast as well as an MR angiogram of the head without contrast and MRA neck with and without contrast on  10/05/2021 and I reviewed the results: IMPRESSION: MRI HEAD IMPRESSION:   1. 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 No associated hemorrhage or significant regional mass effect. 2. Loss of normal flow voids throughout the left ICA and MCA, consistent with slow flow and/or occlusion. 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 3. Underlying mild chronic microvascular ischemic disease. Tiny remote left ACA distribution infarct.   MRA HEAD IMPRESSION:   1. 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 Left A1 segment not well seen either, also likely occluded. 2. Otherwise wide patency of the major intracranial arterial circulation. No other hemodynamically significant or correctable stenosis.  MRA NECK IMPRESSION:   1. 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 Some attenuated flow seen distally within the cervical left ICA, with  subsequent reocclusion by the skull base. 2. 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 Otherwise wide patency of the right carotid artery system. 3. Wide patency of the vertebral arteries within the neck.   He had a swallow study on 10/19/2021 which showed mild dysphagia.   He has been on Repatha.  A1c was 7.1, LDL 44.  Echocardiogram from 10/06/2021 showed an EF of 55 to 60%, normal left ventricular function, no regional wall motion abnormalities, grade 1 diastolic dysfunction.  No regurgitation or stenosis, aortic valve sclerosis was seen, no evidence of aortic stenosis.     I had evaluated him for memory loss in 2015.  He did not return for follow-up after that.    He reports feeling better.  Son reports that they had to move in with them after the stroke.  Patient's wife passed away.  He lived on his own until the stroke.  He has a daughter as well.  He does need help navigate some steps to get into the house but it is a single-story home.  He fell recently but did not hit his head, hit his right rib cage against the coffee table, he missed the chair as he was sitting down.  He has benefited from outpatient therapy, has regular PT, OT and ST at [facility].  He has been able to advance to regular diet, no choking.  He does not always drink enough water, estimates that he drinks about 1 or 2 bottles per day.  He drinks a cup of coffee in the morning and 1 soda per day, occasional tea.  He sleeps fairly well, no significant snoring.  He is followed by cardiology.  He is not driving.  They are wondering if he could drive again.  He is advised currently not to drive.  They are requesting a disability placard for their car.  I would be happy to provide a placard temporarily for 3 months.  He has been on Plavix, seems to tolerate the medication well, it was renewed by his primary care PA recently.  He uses a 2 wheeled walker.  His history is primarily provided by his son.   Patient has significant expressive aphasia. He quit smoking some 20 or 30 years ago, currently does not drink any alcohol.  He has had intermittent dull headaches in the back of his head, not severe, not enough to take medication for this.  REVIEW OF SYSTEMS: Out of a complete 14 system review of symptoms, the patient complains only of the following symptoms, and all other reviewed systems are negative.  See HPI  ALLERGIES: Allergies  Allergen Reactions   Amoxicillin Swelling and Other (See Comments)   Statins Other (See Comments)    Affects the liver   Welchol [Colesevelam Hcl] Other (See Comments)    affects the liver    HOME MEDICATIONS: Outpatient Medications Prior to Visit  Medication Sig Dispense Refill   acetaminophen (TYLENOL) 325 MG tablet Take 2 tablets (650 mg total) by mouth every 4 (four) hours as needed for mild pain (or temp > 37.5 C (99.5 F)). (Patient taking differently: Take 650 mg by mouth as needed for mild pain (or temp > 37.5 C (99.5 F)).)     amLODipine (NORVASC) 5 MG tablet TAKE  1 TABLET BY MOUTH DAILY 100 tablet 0   aspirin EC 81 MG tablet Take 81 mg by mouth daily. Swallow whole.     Cholecalciferol (VITAMIN D3) 25 MCG (1000 UT) CAPS Take 1,000 Units by mouth daily.     clopidogrel (PLAVIX) 75 MG tablet Take 1 tablet (75 mg total) by mouth daily. 90 tablet 3   Cyanocobalamin (VITAMIN B12) 500 MCG TABS Take 500 mcg by mouth daily.     Evolocumab (REPATHA SURECLICK) XX123456 MG/ML SOAJ INJECT 140MG  SUBCUTANEOUSLY  EVERY 2 WEEKS 2 mL 0   glucose blood (ONETOUCH VERIO) test strip CHECK FINGERSTICK BLOOD SUGARS  ONCE DAILY 100 strip 0   Lancets (ONETOUCH DELICA PLUS 123XX123) MISC USE TO CHECK BLOOD GLUCOSE ONCE  DAILY AS DIRECTED 100 each 0   metFORMIN (GLUCOPHAGE-XR) 500 MG 24 hr tablet Take 500 mg by mouth daily with breakfast.     metFORMIN (GLUCOPHAGE-XR) 500 MG 24 hr tablet Take 500 mg by mouth daily with breakfast.     albuterol (VENTOLIN HFA) 108 (90 Base)  MCG/ACT inhaler Inhale 2 puffs into the lungs every 6 (six) hours as needed for wheezing or shortness of breath. 8 g 0   magnesium gluconate (MAGONATE) 500 MG tablet Take 1 tablet (500 mg total) by mouth at bedtime. (Patient not taking: Reported on 04/06/2022) 30 tablet 0   traMADol (ULTRAM) 50 MG tablet Take 1/2 - 1 tab (25-50 mg) PO at bedtime as needed for severe pain preventing sleep.  Try to limit use (only a few doses per week) for acute and chronic shoulder pain (Patient not taking: Reported on 04/06/2022) 20 tablet 0   No facility-administered medications prior to visit.    PAST MEDICAL HISTORY: Past Medical History:  Diagnosis Date   Acid reflux    Aortic atherosclerosis ([place])    a. Noted on CT Abd in 2012.   Arthritis    hands   Carotid atherosclerosis    a. 02/2018 U/S: RICA 123456, LICA 123456; b. [id number] MRA Head/Neck: near complete occlusion L ICA and L MCA. Patent RICA.   Diabetes mellitus without complication (HCC)    Diastolic dysfunction    a. 09/2021 Echo: EF 55-60%, no rwma, GrI DD, nl RV fxn, RVSP 14.43mmHg.   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 09/2021 MRI/MRA Brain: Acute L MCA distribution infarct. Near complete occlusion L ICA and MCA.   Tachycardia    Tinea pedis of both feet 01/10/2017    PAST SURGICAL HISTORY: Past Surgical History:  Procedure Laterality Date   APPENDECTOMY  1973   CHOLECYSTECTOMY  2013   PROSTATECTOMY  2012    FAMILY HISTORY: Family History  Problem Relation Age of Onset   Dementia Mother    High Cholesterol Mother    Hypertension Mother    Arthritis Mother    Hyperlipidemia Mother    Heart disease Father    Heart attack Father     Arthritis Maternal Grandmother    Cancer Maternal Grandmother        colon   Prostate cancer Brother    Cancer Brother        prostate  Cancer Maternal Uncle        colon   COPD Maternal Uncle    Stroke Paternal Grandmother    Diabetes Paternal Grandmother    Aneurysm Paternal Grandfather     SOCIAL HISTORY: Social History   Socioeconomic History   Marital status: Widowed    Spouse name: Regino Schultze   Number of children: 2   Years of education: some college   Highest education level: 12th grade  Occupational History   Occupation: Retired    Comment: [employer]  Tobacco Use   Smoking status: Former    Packs/day: 1.00    Years: 40.00    Additional pack years: 0.00    Total pack years: 40.00    Types: Cigarettes    Quit date: 03/09/1997    Years since quitting: 25.2   Smokeless tobacco: Former   Tobacco comments:    smoking cessation materials not required  Vaping Use   Vaping Use: Never used  Substance and Sexual Activity   Alcohol use: No    Alcohol/week: 0.0 standard drinks of alcohol   Drug use: No   Sexual activity: Never  Other Topics Concern   Not on file  Social History Narrative   Pt lives alone   Social Determinants of Health   Financial Resource Strain: Low Risk  (09/14/2021)   Overall Financial Resource Strain (CARDIA)    Difficulty of Paying Living Expenses: Not hard at all  Food Insecurity: No Food Insecurity (09/14/2021)   Hunger Vital Sign    Worried About Running Out of Food in the Last Year: Never true    [place] in the Last Year: Never true  Transportation Needs: No Transportation Needs (09/14/2021)   PRAPARE - Hydrologist (Medical): No    Lack of Transportation (Non-Medical): No  Physical Activity: Sufficiently Active (09/14/2021)   Exercise Vital Sign    Days of Exercise per Week: 6 days    Minutes of Exercise per Session: 40 min  Stress: No Stress Concern Present (09/14/2021)   [place]    Feeling of Stress : Not at all  Social Connections: Moderately Integrated (09/14/2021)   Social Connection and Isolation Panel [NHANES]    Frequency of Communication with Friends and Family: Three times a week    Frequency of Social Gatherings with Friends and Family: Three times a week    Attends Religious Services: 1 to 4 times per year    Active Member of Clubs or Organizations: No    Attends Archivist Meetings: 1 to 4 times per year    Marital Status: Widowed  Intimate Partner Violence: Not At Risk (09/14/2021)   Humiliation, Afraid, Rape, and Kick questionnaire    Fear of Current or Ex-Partner: No    Emotionally Abused: No    Physically Abused: No    Sexually Abused: No    PHYSICAL EXAM  Vitals:   06/14/22 1431  BP: 138/82  Pulse: (!) 107  Weight: 165 lb 3.2 oz (74.9 kg)  Height: 5\' 7"  (1.702 m)   Body mass index is 25.87 kg/m.  Generalized: Well developed, in no acute distress, very pleasant Neurological examination  Mentation: Alert oriented to time, place, history taking. Follows all commands, moderate dysarthria Cranial nerve II-XII: Pupils were equal round reactive to light. Extraocular movements were full, visual field were full on confrontational test.  Right-sided facial droop to mouth, mild right ptosis.  Head  turning and shoulder shrug  were normal and symmetric. Motor: 4/5 right upper and lower extremity Sensory: Sensory testing is intact to soft touch on all 4 extremities. No evidence of extinction is noted.  Coordination: Mild dysmetria with finger-nose-finger on the right Gait and station: Gait is slightly wide-based, but steady, has a single-point cane in the hallway Reflexes: Deep tendon reflexes are symmetric and normal bilaterally.   DIAGNOSTIC DATA (LABS, IMAGING, TESTING) - I reviewed patient records, labs, notes, testing and imaging myself where available.  Lab Results  Component  Value Date   WBC 7.4 02/07/2022   HGB 15.3 02/07/2022   HCT 43.8 02/07/2022   MCV 90.3 02/07/2022   PLT 254 02/07/2022      Component Value Date/Time   NA 140 02/07/2022 1419   NA 141 01/20/2019 0930   K 4.7 02/07/2022 1419   CL 103 02/07/2022 1419   CO2 29 02/07/2022 1419   GLUCOSE 123 (H) 02/07/2022 1419   BUN 13 02/07/2022 1419   BUN 11 01/20/2019 0930   CREATININE 0.77 02/07/2022 1419   CALCIUM 9.6 02/07/2022 1419   PROT 7.0 02/07/2022 1419   PROT 6.9 08/10/2019 0826   ALBUMIN 3.3 (L) 10/10/2021 0612   ALBUMIN 4.3 08/10/2019 0826   AST 14 02/07/2022 1419   ALT 15 02/07/2022 1419   ALKPHOS 50 10/10/2021 0612   BILITOT 0.7 02/07/2022 1419   BILITOT 0.6 08/10/2019 0826   GFRNONAA >60 10/30/2021 0919   GFRNONAA 85 04/25/2020 1557   GFRAA 99 04/25/2020 1557   Lab Results  Component Value Date   CHOL 112 10/06/2021   HDL 47 10/06/2021   LDLCALC 44 10/06/2021   TRIG 105 10/06/2021   CHOLHDL 2.4 10/06/2021   Lab Results  Component Value Date   HGBA1C 6.4 05/07/2022   No results found for: "VITAMINB12" Lab Results  Component Value Date   TSH 2.268 04/12/2022    Butler Denmark, AGNP-C, DNP 06/14/2022, 3:00 PM [facility] [address] [place], [place] [address] [phone]

## 2022-06-14 ENCOUNTER — Ambulatory Visit: Payer: Medicare Other | Admitting: Neurology

## 2022-06-14 ENCOUNTER — Ambulatory Visit: Payer: Medicare Other | Admitting: Occupational Therapy

## 2022-06-14 ENCOUNTER — Ambulatory Visit: Payer: Medicare Other

## 2022-06-14 ENCOUNTER — Encounter: Payer: Self-pay | Admitting: Neurology

## 2022-06-14 ENCOUNTER — Ambulatory Visit: Payer: Medicare Other | Admitting: Speech Pathology

## 2022-06-14 VITALS — BP 138/82 | HR 107 | Ht 67.0 in | Wt 165.2 lb

## 2022-06-14 DIAGNOSIS — I69359 Hemiplegia and hemiparesis following cerebral infarction affecting unspecified side: Secondary | ICD-10-CM | POA: Diagnosis not present

## 2022-06-14 DIAGNOSIS — I63512 Cerebral infarction due to unspecified occlusion or stenosis of left middle cerebral artery: Secondary | ICD-10-CM

## 2022-06-14 DIAGNOSIS — I69320 Aphasia following cerebral infarction: Secondary | ICD-10-CM | POA: Diagnosis not present

## 2022-06-14 DIAGNOSIS — R41841 Cognitive communication deficit: Secondary | ICD-10-CM

## 2022-06-14 DIAGNOSIS — I69391 Dysphagia following cerebral infarction: Secondary | ICD-10-CM

## 2022-06-14 DIAGNOSIS — R482 Apraxia: Secondary | ICD-10-CM

## 2022-06-14 DIAGNOSIS — M25611 Stiffness of right shoulder, not elsewhere classified: Secondary | ICD-10-CM

## 2022-06-14 DIAGNOSIS — M6281 Muscle weakness (generalized): Secondary | ICD-10-CM

## 2022-06-14 DIAGNOSIS — R278 Other lack of coordination: Secondary | ICD-10-CM

## 2022-06-14 DIAGNOSIS — R4701 Aphasia: Secondary | ICD-10-CM

## 2022-06-14 NOTE — Therapy (Signed)
OUTPATIENT SPEECH LANGUAGE PATHOLOGY TREATMENT/PROGRESS NOTE AND DISCHARGE SUMMARY   Patient Name: Gerald Bauer MRN: [id number] DOB:[birth year], 77 y.o., male Today's Date: 06/14/2022  PCP: [name], PA-C Referring Provider:  [name], PA-C  SPEECH THERAPY DISCHARGE SUMMARY  Visits from Start of Care: 50  Current functional level related to goals / functional outcomes: Patient has met overall 11/14 STGs and 3/4 LTGs targeting aphasia and apraxia of speech. Patient is using compensations effectively for improved intelligibility (90% for unfamiliar listener when using strategies). He reports he is pleased with current functional level and is in agreement with d/c at this time.   Remaining deficits: Moderate verbal apraxia, mild expressive aphasia, and mild cognitive communication deficits impacting planning, impulsivity, error awareness, attention and memory.    Education / Equipment: Cognitive and language activities for home, community resources and support.    Patient agrees to discharge. Patient goals were partially met. Patient is being discharged due to being pleased with the current functional level..     END OF SESSION:   End of Session - 06/14/22 0941     Visit Number 50    Number of Visits 3    Date for SLP Re-Evaluation 07/24/22    SLP Start Time 0900    SLP Stop Time  1000    SLP Time Calculation (min) 60 min    Activity Tolerance Patient tolerated treatment well             Past Medical History:  Diagnosis Date   Acid reflux    Aortic atherosclerosis ([place])    a. Noted on CT Abd in 2012.   Arthritis    hands   Carotid atherosclerosis    a. 02/2018 U/S: RICA 123456, LICA 123456; b. [id number] MRA Head/Neck: near complete occlusion L ICA and L MCA. Patent RICA.   Diabetes mellitus without complication (HCC)    Diastolic dysfunction    a. 09/2021 Echo: EF 55-60%, no rwma, GrI DD, nl RV fxn, RVSP 14.32mmHg.   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 09/2021 MRI/MRA Brain: Acute L MCA distribution infarct. Near complete occlusion L ICA and MCA.   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   Onychomycosis of multiple toenails with type 2 diabetes mellitus ([place]) 07/19/2016   Carotid stenosis 03/13/2016   Hyperlipidemia associated with type 2 diabetes mellitus ([place])    Hypertension associated with type 2 diabetes mellitus ([place])    Personal history of prostate cancer    Carotid atherosclerosis    GERD (gastroesophageal reflux disease) 08/20/2013    ONSET DATE:  10/05/2021    REFERRING DIAG: Cerebral infarction due to unspecified occlusion of left middle cerebral artery   THERAPY DIAG:  Verbal apraxia Verbal apraxia   Rationale for Evaluation and Treatment Rehabilitation   SUBJECTIVE STATEMENT:           "Good." Pt accompanied by: daughter-in-law   PERTINENT HISTORY: Pt  is a 77 year old male with history of left-sided carotid stenosis, non-insulin-dependent diabetes mellitus type 2, PAD, GERD, mild COPD, history of tobacco use, hyperlipidemia, hypertension, who presented 10/05/21 to [facility]  ED with facial droop, expressive aphasia, and R sided weakness and found to have left MCA CVA. He was admitted to [facility] 7/17-10/31/21. Advanced to dysphagia 3/thin liquids by cup by time of d/c from CIR. 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 Difficult to fully assess cognition given severity of linguistic impairment; however, does present with decreased safety awareness, diminished problem-solving, emergent awareness, and mild impulsivity with movement."     DIAGNOSTIC FINDINGS: MBS 10/19/21: "Pt presents with overall mild oropharyngeal dysphagia. Oral phase c/b piecemeal deglutition, decreased mastication, decreased bolus cohesion, premature spillage, and weak lingual manipulation. Pharyngeal phase c/b reduced pharyngeal peristalsis + reduced BOT approximation to PPW, which results in mild to moderate vallecular and pyriform sinuses residuals (vallecula > pyriform sinuses for solids, pyriform sinuses > vallecula for liquids). No penetration nor aspiration appreciated with thin liquid via cup, Dysphagia 1 (puree), Dysphagia 2 (chopped), Dysphagia 3 (mechanical soft), or with 13 mm Barium tablet placed in puree. Once instance of sensed aspiration with intake of large bolus of thin liquid via straw due to swallow initiation delay to pyriform sinuses and mistiming of swallow-breath cycle; despite strong reflexive cough aspirant was not fully cleared from trachea. Pt's level of swallow initiation was noted to vary with liquid consumption (over base of epiglottis and at pyriform sinuses) with level of swallow initiation for solids to be consistent at the vallecula. Pharyngeal stasis was partially cleared with reflexive secondary swallow." MRI brain 10/05/21: 1. 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 No associated hemorrhage or significant regional mass effect. 2. Loss of normal flow  voids throughout the left ICA and MCA, consistent with slow flow and/or occlusion. 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 3. Underlying mild chronic microvascular ischemic disease. Tiny remote left ACA distribution infarct.   PAIN:  Are you having pain? No   PATIENT GOALS talk better   OBJECTIVE:    TODAY'S TREATMENT:     Administered CLQT for reassessment of cognitive linguistic function and provided education on follow-up recommendations and community resources for planned d/c today.   Cognitive Linguistic Quick Test  AGE - 70-89   The Cognitive Linguistic Quick Test (CLQT) was administered to assess the relative status of five cognitive domains: attention, memory, language, executive functioning, and visuospatial skills. Scores from 10 tasks were used to estimate severity ratings (standardized for age groups 18-69 years and 70-89 years) for each domain, a clock drawing task, as well as an overall composite severity rating of cognition.       Task Score Criterion Cut Scores  Personal Facts 8/8 8  Symbol Cancellation 11/12 10  Confrontation Naming 10/10 10  Clock Drawing  10/13 11  Story Retelling 6/10 5  Symbol Trails 1/10 6  Generative Naming 3/9 4  Design Memory 4/6 4  Mazes  5/8 4  Design Generation 1/13 5  Cognitive Domain Composite Score Severity Rating  Attention 143/215 Mild  Memory 135/185 Mild  Executive Function 10/40 Moderate  Language 27/37 Mild  Visuospatial Skills 56/105 Mild  Clock Drawing  10/13 Mild  Composite Severity Rating 2.8 Mild     PATIENT EDUCATION: Education details: impulsivity, planning deficits may impact safety with driving, recommend further driving evaluation if pt wishes to drive Person educated: Patient  Education method: Explanation Education comprehension: verbalized understanding and needs further education     GOALS: Goals reviewed with patient? Yes   SHORT TERM  GOALS: Target date: 10 sessions   Patient will participate in clinical assessment of swallow function with goals added as needed. Baseline: Goal status: MET   2.  Pt will communicate emergency information 100% accuracy using visual aid if necessary.  Baseline:  Goal status: MET   3.  Pt will approximate personally relevant words and phrases >80% accuracy using script training and min visual cues for apraxia. Baseline:  Goal status: MET   4.  Pt will generate at least 4 descriptors of target word 80% of the time using semantic feature analysis to improve abilities in wordfinding and resolving communication breakdowns.  Baseline:  Goal status: MET   5.  Pt will complete HEP for dysphagia with rare min A. Baseline:  Goal status: MET   6.  Pt will generate sentences using personally relevant words list for >80% intelligibility. Baseline: 50% intelligibility Goal status: MET   7.  Pt will improve expressive language skills by taking 4-6 turns in simple conversation, with supported conversation strategies if needed. Baseline:  Goal status: MET 8.  Pt will generate sentences moderately complex sentences 8-10 words >80% intelligibility, with self-correction/multimodal communication allowed. Baseline: 50% intelligibility Goal status: MET   9.  Patient will use intelligibility strategies and script training for 4-6 turn telephone exchange with family member or friend. Baseline:  Goal status: MET  10.  Pt will demonstrate operational competence using communication device by independently powering on/off his device, navigating forward/back and selecting icons. Baseline:  Goal status: DEFERRED   11.  Pt will answer simple questions using AAC device to provide information re: emotional state, pain, and preferences. Baseline:  Goal status: DEFERRED   12.  Pt will use AAC in social exchange of 2-4 turns to share and request information.  Baseline:  Goal status: DEFERRED  13.  Pt will  achieve 80% intelligibility in 6-8 turn exchange with less familiar listener or unknown context. Baseline: by visit 44 Goal status: MET  14.  Pt will monitor for signs of listener comprehension at sentence level and initiate repair when breakdowns occur in 80% of opportunities. Baseline: by visit 44 Goal status: MET        LONG TERM GOALS: Target date: 07/24/2022   Pt will engage in 10-12 minutes simple-mod complex conversation re: topic of interest with supported conversation, aphasia compensations.  Baseline: 01/25/22: 5 turn exchange. Goal renewed 01/25/22, achieved 5-8 min 04/26/22 and goal revised.  Goal status: MET   2.  Pt will ID and attempt repair of communication breakdowns >90% of the time for less familiar listener.    Baseline: 01/25/22: requires occasional mod cues to initiate correction; renewed 01/25/22, achieved 04/26/22 and revised for less-familiar listener Goal status: MET   3.  Pt/family will demonstrate knowledge of community resources and activities to support language/communication. Baseline: 01/25/22: referral completed to [facility]; pt has yet to register for group, renewed 01/25/22 Goal status: MET   4.  Pt will  demonstrate use of swallowing precautions independently with s/sx aspiration <5% of trials. Baseline: 01/25/22: no overt s/sx when sipping water in sessions, pt and family report no issues at home with meals Goal status: DEFERRED       ASSESSMENT:   CLINICAL IMPRESSION: Patient continues with verbal apraxia and aphasia (verbal and written expression). Patient using tapping stick effectively to help him focus on slowing down his rate of speech. Cognitive assessment reveals deficits in non-linguistic cognition; overall mild severity for age per CLQT. Impulsivity and planning deficits confirmed during assessment and OT driving evaluation is recommended for further assessment. I recommend skilled ST to improve pt's language and motor speech skills to improve  communication and reduce frustration.    OBJECTIVE IMPAIRMENTS include expressive language, receptive language, aphasia, apraxia, dysarthria, and dysphagia. These impairments are limiting patient from managing medications, managing appointments, managing finances, household responsibilities, ADLs/IADLs, effectively communicating at home and in community, and safety when swallowing. Factors affecting potential to achieve goals and functional outcome are cooperation/participation level; pt appears highly motivated with good family support. Patient will benefit from skilled SLP services to address above impairments and improve overall function.   REHAB POTENTIAL: Excellent   PLAN: SLP FREQUENCY: d/c   SLP DURATION: d/c   PLANNED INTERVENTIONS: Aspiration precaution training, Pharyngeal strengthening exercises, Diet toleration management , Language facilitation, Environmental controls, Trials of upgraded texture/liquids, Cognitive reorganization, Internal/external aids, Functional tasks, Multimodal communication approach, SLP instruction and feedback, Compensatory strategies, and Patient/family education  [name], MS, CCC-SLP Speech-Language Pathologist [phone]  [name], [place] 06/14/2022, 9:42 AM  [place] at [facility] [place], [place], [address] Phone: [phone]   Fax:  [phone]

## 2022-06-14 NOTE — Patient Instructions (Addendum)
Speech Therapy Discharge Recommendations  Remember to SLOW DOWN :) not just your speech, but your planning and decision-making. Take your time, double check yourself.   Have at least one good long conversation a day. Call your friend on the phone. Use your scripts.   Try to join some of the community groups: [employer]. The group in [place] is in person and might be a good fit. You can also try some groups online.   Come to the stroke support group at [facility].  Do something for your brain every day. Play cards, solitaire, checkers, easy crossword puzzles. Try listening to a Bible story and doing the crossword that goes with it.

## 2022-06-14 NOTE — Patient Instructions (Signed)
Continue aspirin, plavix  Keep BP < 130/90, LDL < 70, A1C < 7.0 Continue follow up with primary care doctor, cardiology  See you back as needed

## 2022-06-14 NOTE — Therapy (Signed)
OCCUPATIONAL THERAPY TREATMENT NOTE/DISCHARGE NOTE MRN: [id number] Bauer, Gerald DOB:Jul 29, 1945, 77 y.o., male  PCP: Threasa Alpha, PA REFERRING PROVIDER: Reesa Chew    OT End of Session - 06/15/22 1834     Visit Number 65    Number of Visits 57    Date for OT Re-Evaluation 07/12/22    OT Start Time 1015    OT Stop Time 1100    OT Time Calculation (min) 45 min    Activity Tolerance Patient tolerated treatment well    Behavior During Therapy WFL for tasks assessed/performed                         Past Medical History:  Diagnosis Date   Acid reflux    Aortic atherosclerosis ([place])    a. Noted on CT Abd in 2012.   Arthritis    hands   Carotid atherosclerosis    a. 02/2018 U/S: RICA 123456, LICA 123456; b. [id number] MRA Head/Neck: near complete occlusion L ICA and L MCA. Patent RICA.   Diabetes mellitus without complication (HCC)    Diastolic dysfunction    a. 09/2021 Echo: EF 55-60%, no rwma, GrI DD, nl RV fxn, RVSP 14.49mmHg.   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 09/2021 MRI/MRA Brain: Acute L MCA distribution infarct. Near complete occlusion L ICA and MCA.   Tachycardia    Tinea pedis of both feet 01/10/2017   Past Surgical History:  Procedure Laterality Date   APPENDECTOMY  1973   CHOLECYSTECTOMY  2013   PROSTATECTOMY  2012   Patient Active Problem List   Diagnosis Date Noted   Claustrophobia 04/19/2022   H/O prostatectomy 02/06/2022   Hemiparesis, aphasia, and dysphagia as late effects of stroke ([place]) 11/07/2021   Lower urinary tract symptoms (LUTS) 11/07/2021   Myalgia due to statin 02/21/2021   Type 2 diabetes mellitus without complication, without long-term  current use of insulin ([place]) 02/21/2021   Aortic atherosclerosis ([place]) 04/27/2020   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   Onychomycosis of multiple toenails with type 2 diabetes mellitus ([place]) 07/19/2016   Carotid stenosis 03/13/2016   Hyperlipidemia associated with type 2 diabetes mellitus ([place])    Hypertension associated with type 2 diabetes mellitus ([place])    Personal history of prostate cancer    Carotid atherosclerosis    GERD (gastroesophageal reflux disease) 08/20/2013    ONSET DATE: 10/05/21  REFERRING DIAG: L MCA CVA  THERAPY DIAG:  Muscle weakness (generalized)  Other lack of coordination  Decreased ROM of right shoulder  Left middle cerebral artery stroke ([place])  Rationale for Evaluation and Treatment Rehabilitation  SUBJECTIVE:   SUBJECTIVE STATEMENT:  Pt reports he feels he is ready for discharge, today was his last day with speech therapy.  Has been staying at his house more alone and has returned to performing basic tasks with independence.    PERTINENT HISTORY: Per chart, Gerald Bauer is a 77 year old right-handed male with history of hypertension, hyperlipidemia, diabetes mellitus, prostate cancer/prostatectomy 2012, quit smoking 24 years ago.  Presented to [facility]2021 with right side weakness/facial droop and expressive aphasia.  Blood pressure 155/102.  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  No associated hemorrhage or significant mass effect.    PAIN:  Are you having pain? No reports of pain today  FALLS: Has patient fallen in last 6 months? Yes. Number of falls 3 since CVA  PLOF: Independent/retired Dealer  PATIENT GOALS : Pt points to his hand (wants to be able to use his hand)  OBJECTIVE:   HAND DOMINANCE: Right   FUNCTIONAL OUTCOME MEASURES: FOTO: 48 , discharge score of 88  UUPPER EXTREMITY ROM      Active ROM Right eval Left Eval WNL Right 12/11/21 Right 01/23/2022 Right 03/15/2022  Right 04/19/2022 Right 06/14/2022  Shoulder flexion 90 (135)   95 (110) 95 97 sitting 87 sitting 94  Shoulder abduction 95 (110)   80 (90) 95 96 sitting 86 sitting 90  Wrist flex     54 60 62 62 62  Wrist ext     41 45 48 50 52    UPPER EXTREMITY MMT:      MMT Right eval Left Eval 5/5 Right 12/11/21 Right 03/15/22 Right 04/19/2022 Right 06/14/2022  Shoulder flexion 3-   -3 3- 3- 3-  Shoulder abduction 3-   -3 3- 3- 3-  Shoulder adduction           Shoulder extension           Shoulder internal rotation 3-         Shoulder external rotation 3-         Middle trapezius           Lower trapezius           Elbow flexion 4-   4 4+ 5 5  Elbow extension 4+   5 5 5 5   Wrist flexion 4   NT d/t pain 4 4+ 5  Wrist extension 4-   NT d/t pain 4 4+ 5  (Blank rows = not tested)   HAND FUNCTION: Eval:         Grip strength: Right: 14 lbs; Left: 58 lbs, Lateral pinch: Right: 7 lbs, Left: 19 lbs, and 3 point pinch: Right: 2 lbs, Left: 16 lbs 10th visit: Grip strength: Right: 16  lbs;                                Lateral pinch: Right: 9  lbs;                            3 point pinch: Right: 5 lbs 20th visit: Grip strength: Right: 20 lbs; Left: 70 lbs, Lateral pinch: Right: 10 lbs, Left: 20 lbs, and 3 point pinch: Right: 9 lbs, Left: 20 lbs  30th visit: Grip strength: Right: 18 lbs; Left: 70 lbs, Lateral pinch: Right: 12 lbs, Left: 20 lbs, and 3 point pinch: Right: 8 lbs, Left: 20 lbs  [id number] Recert: Grip strength: Right: 20 lbs; Left: 70 lbs, Lateral pinch: Right: 10 lbs, Left: 20 lbs, and 3 point pinch: Right: 8 lbs, Left: 20 lbs  06/14/2022 Discharge: Grip strength: Right: 30 lbs; Left: 65 lbs, Lateral pinch: Right: 13 lbs, Left: 30 lbs, and 3  point pinch: Right: 16 lbs, Left: 22 lbs    Eval: COORDINATION: Finger Nose Finger test: difficult/lacks precision with reaching targets 9 Hole Peg test: Right: unable sec; Left: 27 sec Able to oppose 2nd  and 3rd digits to thumb on R hand   10th  visit 9 hole Peg test: Right: 11 min, 44 sec   20th visit:  9 hole Peg test: Right: 3 min 6 sec   30th visit:  9 hole Peg test: Right: 1 min 45 sec   [id number] Recert:  9 hole Peg test: Right: 1 min 30 sec   06/14/2022:  Discharge: 9 hole peg right 43 secs.   TODAY'S TREATMENT:    Therapeutic Exercise: Pt seen for reassessment of hand skills as noted above measurements.  Pt demonstrates significant improvements in right hand ROM of UE, strength and coordination.  Review of HEP for hand strengthening with putty.   ADL: Pt is independent with bathing, dressing, tying shoes, grooming.  Independent with laundry, light cooking, light cleaning, checking the mail (long driveway), sweeping, vacuuming.   He is no longer driving to date, has someone perform yardwork.   Reassessment of goals as noted below.     PATIENT EDUCATION: Education details: prehension patterns Person educated: Counselling psychologist method: Merchandiser, retail cues Education comprehension: verbalized understanding  HOME EXERCISE PROGRAM: Theraputty, hand writing skills, table slides  GOALS: Goals reviewed with patient? Yes   SHORT TERM GOALS: Target date 05/31/2022       Pt will be indep to perform HEP for increasing strength and coordination throughout RUE.  Baseline: Not yet initiated; 10th: putty given but pt reports limited use, instructed in table slides and self PROM for R wrist/digits. 20th:  Pt performing exercises but requires continual upgrades and instruction 30th: Pt performing exercises but requires continual upgrades, modifications, and instruction as needed 04/19/2022: Independent with HEPs. Will continue to upgrade as indicated. 40th visit: Independent Goal status: MET   2.  Pt will manage clothing fasteners with extra time, using RUE as an assist  Baseline: unable; pt has elastic laces and wears elastic waisted pants; 10th: pt uses R hand as an assist to zip and button jeans with extra time; not  yet tried small buttons on a shirt.  Pt continues to use elastic laces on shoes. 20th:  Improving with manipulation skills but still using elastic laces, able to button pants and shirt with increased time and effort. 30th: Pt. Is able to fasten pant fasteners, and button pants efficiently, ans independently. Goal status: met   LONG TERM GOALS: Target date:07/12/2022   Pt will increase FOTO score to 55 or better to indicate improved performance with daily tasks.  Baseline: 48; 10th: 48; 20th: score:42 30th visit: 64, 40th visit: 65 Goal status: Achieved   2.  Pt will increase R grip strength by 10 or more lbs to enable pt to hold and carry light ADL supplies without dropping.  Baseline: R grip 14#, L 58#; 10th visit: R grip 16; 20th visit: 20# 30th: 18#, Pt. Continues to have difficulty holding supplies securely. 04/19/2022: R: 20# 40th visit: Pt. Continues to have difficulty holding supplies securely Goal status: MET   3.  Pt will increase RUE strength to be able to engage RUE into ADLs at least 50% of the time. Baseline: Pt using L non-dominant arm to manage ADLs; 10th visit: Son reports he constantly sees pt attempt to use his R arm for daily tasks, but continues to be limited and still must use LUE for most tasks (see chart for RUE MMT) 20th:  continues to improve on strength and is working on consistency of use. 30th visit: Pt. Continues to use the right hand during daily  ADL, and IADL tasks. 04/19/2022: RUE strength continues to be limited. Pt. Is in the process of having his persistent arm pain assessed by an orthopedic physician. Pt. Is consistently engaging his right hand during ADLs more than  50% of the time.  Goal status: MET     4.  Pt will complete 9 hole peg test on the R in 1 min or less to work towards ability to use R hand to pick up small ADL supplies.  Baseline: Pt can remove a peg but can not pick one up; 10th visit: R 9 hole completion 11 min 44 sec; 20th visit:   Pt improved  to 3 mins and 6 secs, nearing goal. 30 visit: 1 min. & 45 sec. 04/19/2022: 1 min. & 30 sec. 40th visit: Pt. Continues to present with limited [facility] skill, and difficulty manipulating small objects. Goal status: MET   5.  Pt will improve right pinch  strength to be able to open a tab on a can of soup with modified independence.  Baseline: Right lateral pinch 10#, 3pt. Pinch 8#. Pt. is unable to open, and pull the tab on a can of soup. 40th visit: Pt.  Has difficulty opening, and pulling the tab on a can of soup Goal status: MET  6. Pt. will increase right shoulder ROM by 10 degrees to improve functional reach during ADLs, and IADL. Baseline: Right shoulder AROM flexion in sitting; 87, Abduction: 86; 40th visit: Right shoulder flexion in limited. New diagnosis for frozen shoulder, received an injection yesterday. Pt. reports having a good response with decreased pain following. Goal status: Partially Met  ASSESSMENT:  CLINICAL IMPRESSION: Pt seen for reassessment this date for hand function, coordination, strength and use of RUE in daily tasks.  Pt has made significant progress in all areas.  He denies right shoulder pain and met all goals except ROM of shoulder goal and has maintained around 90 degrees of shoulder flexion.  Pt is independent with all self care tasks and light homemaking skills and has returned to independent living in his house alone.  The only task he has not returned to is driving, still has family to drive him to appts and to the store.  He has assistance with yardwork.  He has a supportive family who assists as needed with tasks.  His FOTO score improved to 88 exceeding prediction.  Goals met with exception of ROM of shoulder goal.  Will plan to discharge at this time.  Pt to continue with HEP for strength, ROM and coordination skills, he has continued to make good progress and has the potential to continue to improve especially if he is consistent with performing home program.     PERFORMANCE DEFICITS in functional skills including ADLs, IADLs, coordination, dexterity, ROM, strength, pain, flexibility, FMC, GMC, mobility, balance, continence, decreased knowledge of use of DME, and UE functional use, cognitive skills including safety awareness, and psychosocial skills including.   IMPAIRMENTS are limiting patient from ADLs, IADLs, leisure, and social participation.   COMORBIDITIES may have co-morbidities  that affects occupational performance. Patient will benefit from skilled OT to address above impairments and improve overall function.  MODIFICATION OR ASSISTANCE TO COMPLETE EVALUATION: Min-Moderate modification of tasks or assist with assess necessary to complete an evaluation.  OT OCCUPATIONAL PROFILE AND HISTORY: Problem focused assessment: Including review of records relating to presenting problem.  CLINICAL DECISION MAKING: Moderate - several treatment options, min-mod task modification necessary  REHAB POTENTIAL: Good  EVALUATION COMPLEXITY: Moderate  PLAN: OT FREQUENCY: 2x/week  OT DURATION: 12 weeks  PLANNED INTERVENTIONS: self care/ADL training, therapeutic exercise, therapeutic activity, neuromuscular re-education, manual therapy, passive range of motion, balance training, functional mobility training, moist heat, cryotherapy, patient/family education, cognitive remediation/compensation, energy conservation, coping strategies training, and DME and/or AE instructions  RECOMMENDED OTHER SERVICES: N/A  CONSULTED AND AGREED WITH PLAN OF CARE: Patient and family member/caregiver  PLAN FOR NEXT SESSION: HEP progression, neuro re-ed, therapeutic exercises   Jasman Murri T Blaiden Werth, OTR/L, CLT  06/15/22, 6:35 PM

## 2022-06-19 ENCOUNTER — Ambulatory Visit: Payer: Medicare Other

## 2022-06-19 ENCOUNTER — Ambulatory Visit: Payer: Medicare Other | Admitting: Speech Pathology

## 2022-06-19 ENCOUNTER — Ambulatory Visit: Payer: Medicare Other | Admitting: Occupational Therapy

## 2022-06-21 ENCOUNTER — Ambulatory Visit: Payer: Medicare Other

## 2022-06-21 ENCOUNTER — Ambulatory Visit: Payer: Medicare Other | Admitting: Speech Pathology

## 2022-06-21 ENCOUNTER — Ambulatory Visit: Payer: Medicare Other | Admitting: Occupational Therapy

## 2022-06-24 ENCOUNTER — Other Ambulatory Visit: Payer: Self-pay | Admitting: Physician Assistant

## 2022-06-24 DIAGNOSIS — E1159 Type 2 diabetes mellitus with other circulatory complications: Secondary | ICD-10-CM

## 2022-06-25 NOTE — Telephone Encounter (Signed)
Requested Prescriptions  Refused Prescriptions Disp Refills   amLODipine (NORVASC) 5 MG tablet [Pharmacy Med Name: amLODIPine Besylate 5 MG Oral Tablet] 90 tablet 3    Sig: TAKE 1 TABLET BY MOUTH DAILY     Cardiovascular: Calcium Channel Blockers 2 Passed - 06/24/2022 10:50 PM      Passed - Last BP in normal range    BP Readings from Last 1 Encounters:  06/14/22 138/82         Passed - Last Heart Rate in normal range    Pulse Readings from Last 1 Encounters:  06/14/22 (!) 107         Passed - Valid encounter within last 6 months    Recent Outpatient Visits           4 months ago Hyperlipidemia associated with type 2 diabetes mellitus ([place])   [facility] [name], PA-C   7 months ago Encounter for examination following treatment at [facility] [name], PA-C   9 months ago Skin cancer screening   [facility] Mecum, Dani Gobble, PA-C   1 year ago Type 2 diabetes mellitus without complication, without long-term current use of insulin [facility])   [facility] [name], PA-C   1 year ago Type 2 diabetes mellitus without complication, without long-term current use of insulin [facility])   [facility] [name], PA-C       Future Appointments             In 2 weeks Gollan, Kathlene November, MD [place] [facility] [place]   In 1 month [name], [facility], [facility]

## 2022-06-26 ENCOUNTER — Ambulatory Visit: Payer: Medicare Other | Admitting: Speech Pathology

## 2022-06-26 ENCOUNTER — Ambulatory Visit: Payer: Medicare Other

## 2022-06-26 ENCOUNTER — Ambulatory Visit: Payer: Medicare Other | Admitting: Occupational Therapy

## 2022-06-28 ENCOUNTER — Ambulatory Visit: Payer: Medicare Other | Admitting: Occupational Therapy

## 2022-06-28 ENCOUNTER — Ambulatory Visit: Payer: Medicare Other

## 2022-06-28 ENCOUNTER — Ambulatory Visit: Payer: Medicare Other | Admitting: Speech Pathology

## 2022-07-03 ENCOUNTER — Other Ambulatory Visit: Payer: Self-pay | Admitting: Family Medicine

## 2022-07-03 ENCOUNTER — Ambulatory Visit: Payer: Medicare Other | Admitting: Occupational Therapy

## 2022-07-03 ENCOUNTER — Ambulatory Visit: Payer: Medicare Other | Admitting: Speech Pathology

## 2022-07-03 ENCOUNTER — Ambulatory Visit: Payer: Medicare Other

## 2022-07-03 DIAGNOSIS — E1165 Type 2 diabetes mellitus with hyperglycemia: Secondary | ICD-10-CM

## 2022-07-05 ENCOUNTER — Ambulatory Visit: Payer: Medicare Other | Admitting: Occupational Therapy

## 2022-07-05 ENCOUNTER — Ambulatory Visit: Payer: Medicare Other | Admitting: Speech Pathology

## 2022-07-05 ENCOUNTER — Ambulatory Visit: Payer: Medicare Other

## 2022-07-08 NOTE — Progress Notes (Unsigned)
Date:  07/09/2022   ID:  Gerald Bauer, DOB [birth year], MRN [id number]  Patient Location:  [address] [place] [address]   Provider location:   [facility], [place] office  PCP:  Danelle Berry, PA-C  Cardiologist:  Fonnie Mu   Chief Complaint  Patient presents with   3 month follow up     "Doing well." Medications reviewed by the patient verbally.     History of Present Illness:    Gerald Bauer is a 77 y.o. male past medical history of HTN Syncope Palpitations Former smoke, 40 yrs Carotid stenosis, 100% on the left CT ABD aortic: Calcification in the proximal LAD, moderate aortic atherosclerosis noted Who presents for discussion of his hyperlipidemia  Last seen by myself in clinic December 2022 Seen by one of our providers January 2024  left carotid occlusion with resultant left MCA territory stroke in July 2023  Symptoms of aphasia Completed rehab Presents today with his daughter Lost wife 2017  Continued weakness on the right side, estimates it is 50% Continued moderate aphasia On aspirin Plavix, Repatha  Lab work reviewed A1c 6.4 Total cholesterol 112 LDL 44  Stress test 1/24: Low risk, no ischemia  Denies significant chest pain concerning for angina  Lab work reviewed LDL 50 A1C 6.5  Difficulty tolerating statins Reports he has tried Crestor, welcol Lovastatin, myalgia in back  Denies any recent near syncope or syncope symptoms  Prior history reviewed  syncopal episode at home doing Mother's Day dinner 2015 sitting on a couch in got up and fell to the floor  Loss of consciousness for short time.   echocardiogram which showed ejection fraction about 50%. Holter monitor was also unremarkable and regular stress test she was able to exercise 6 min and reached a heart rate of 150 and was unremarkable.   Past Medical History:  Diagnosis Date   Acid reflux    Aortic atherosclerosis    a. Noted  on CT Abd in 2012.   Arthritis    hands   Carotid atherosclerosis    a. 02/2018 U/S: RICA 1-39%, LICA 40-59%; b. 09/2021 MRA Head/Neck: near complete occlusion L ICA and L MCA. Patent RICA.   Diabetes mellitus without complication    Diastolic dysfunction    a. 09/2021 Echo: EF 55-60%, no rwma, GrI DD, nl RV fxn, RVSP 14.26mmHg.   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 09/2021 MRI/MRA Brain: Acute L MCA distribution infarct. Near complete occlusion L ICA and MCA.   Tachycardia    Tinea pedis of both feet 01/10/2017   Past Surgical History:  Procedure Laterality Date   APPENDECTOMY  1973   CHOLECYSTECTOMY  2013   PROSTATECTOMY  2012     Allergies:   Amoxicillin, Statins, and Welchol [colesevelam hcl]   Social History   Tobacco Use   Smoking status: Former    Packs/day: 1.00    Years: 40.00    Additional pack years: 0.00    Total pack years: 40.00    Types: Cigarettes    Quit date: 03/09/1997    Years since quitting: 25.3  Smokeless tobacco: Former   Tobacco comments:    smoking cessation materials not required  Vaping Use   Vaping Use: Never used  Substance Use Topics   Alcohol use: No    Alcohol/week: 0.0 standard drinks of alcohol   Drug use: No     Current Outpatient Medications on File Prior to Visit  Medication Sig Dispense Refill   acetaminophen (TYLENOL) 325 MG tablet Take 2 tablets (650 mg total) by mouth every 4 (four) hours as needed for mild pain (or temp > 37.5 C (99.5 F)). (Patient taking differently: Take 650 mg by mouth as needed for mild pain (or temp > 37.5 C (99.5 F)).)     amLODipine (NORVASC) 5 MG tablet TAKE 1 TABLET BY MOUTH DAILY 100 tablet 0   aspirin EC 81 MG tablet  Take 81 mg by mouth daily. Swallow whole.     Cholecalciferol (VITAMIN D3) 25 MCG (1000 UT) CAPS Take 1,000 Units by mouth daily.     clopidogrel (PLAVIX) 75 MG tablet Take 1 tablet (75 mg total) by mouth daily. 90 tablet 3   Cyanocobalamin (VITAMIN B12) 500 MCG TABS Take 500 mcg by mouth daily.     Evolocumab (REPATHA SURECLICK) 140 MG/ML SOAJ INJECT  SUBCUTANEOUSLY  EVERY 2 WEEKS 2 mL 0   Lancets (ONETOUCH DELICA PLUS LANCET33G) MISC USE TO CHECK BLOOD GLUCOSE ONCE  DAILY AS DIRECTED 100 each 0   metFORMIN (GLUCOPHAGE-XR) 500 MG 24 hr tablet Take 500 mg by mouth daily with breakfast.     ONETOUCH VERIO test strip CHECK FINGERSTICK BLOOD SUGARS  ONCE DAILY 100 strip 3   No current facility-administered medications on file prior to visit.     Family Hx: The patient's family history includes Aneurysm in his paternal grandfather; Arthritis in his maternal grandmother and mother; COPD in his maternal uncle; Cancer in his brother, maternal grandmother, and maternal uncle; Dementia in his mother; Diabetes in his paternal grandmother; Heart attack in his father; Heart disease in his father; High Cholesterol in his mother; Hyperlipidemia in his mother; Hypertension in his mother; Prostate cancer in his brother; Stroke in his paternal grandmother.  ROS:   Please see the history of present illness.    Review of Systems  Constitutional: Negative.   HENT: Negative.    Respiratory: Negative.    Cardiovascular: Negative.   Gastrointestinal: Negative.   Musculoskeletal: Negative.   Neurological: Negative.   Psychiatric/Behavioral: Negative.    All other systems reviewed and are negative.    Labs/Other Tests and Data Reviewed:    Recent Labs: 02/07/2022: ALT 15; BUN 13; Creat 0.77; Hemoglobin 15.3; Platelets 254; Potassium 4.7; Sodium 140 04/12/2022: TSH 2.268   Recent Lipid Panel Lab Results  Component Value Date/Time   CHOL 112 10/06/2021 05:10 AM   CHOL 122 08/10/2019 08:26 AM   TRIG  105 10/06/2021 05:10 AM   HDL 47 10/06/2021 05:10 AM   HDL 48 08/10/2019 08:26 AM   CHOLHDL 2.4 10/06/2021 05:10 AM   LDLCALC 44 10/06/2021 05:10 AM   LDLCALC 50 01/23/2021 02:00 PM    Wt Readings from Last 3 Encounters:  07/09/22 170 lb 2 oz (77.2 kg)  06/14/22 165 lb 3.2 oz (74.9 kg)  04/06/22 161 lb (73 kg)     Exam:    Vital Signs: Vital signs may also be detailed in the HPI BP 130/76 (BP Location: Left Arm, Patient Position: Sitting, Cuff Size: Normal)   Pulse 95   Ht  (1.702 m)  Wt 170 lb 2 oz (77.2 kg)   SpO2 98%   BMI 26.65 kg/m   Constitutional:  oriented to person, place, and time. No distress.  HENT:  Head: Grossly normal Eyes:  no discharge. No scleral icterus.  Neck: No JVD, no carotid bruits  Cardiovascular: Regular rate and rhythm, no murmurs appreciated Pulmonary/Chest: Clear to auscultation bilaterally, no wheezes or rails Abdominal: Soft.  no distension.  no tenderness.  Musculoskeletal: Normal range of motion Neurological:  normal muscle tone. Coordination normal. No atrophy Skin: Skin warm and dry Psychiatric: normal affect, pleasant  ASSESSMENT & PLAN:    Problem List Items Addressed This Visit       Cardiology Problems   Hypertension associated with type 2 diabetes mellitus     Other   Hemiparesis, aphasia, and dysphagia as late effects of stroke   Other Visit Diagnoses     Encounter for long-term (current) use of antiplatelets/antithrombotics       on ASA and plavix, failed ASA, per neurology note initially inpt, pt will f/up with neuro outpt and review, currently no sign/sx of bleeding   Encounter for examination following treatment at hospital       hospital and rehab documents, consults, labs, imaging and discharge summary all reviewed today     PAD/aortic atherosclerosis/carotid stenosis on left Suffered stroke July 2023, occluded left carotid Moderate aortic atherosclerosis Prior smoker,  Intolerant of statins including  Crestor lovastatin WelChol with myalgias On Repatha, lipids well-controlled Has not seen Dr. 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, [place], [place] [address]

## 2022-07-09 ENCOUNTER — Encounter: Payer: Self-pay | Admitting: Cardiovascular Disease

## 2022-07-09 ENCOUNTER — Ambulatory Visit: Payer: Medicare Other | Attending: Cardiovascular Disease | Admitting: Cardiovascular Disease

## 2022-07-09 VITALS — BP 130/76 | HR 95 | Ht 67.0 in | Wt 170.1 lb

## 2022-07-09 DIAGNOSIS — Z09 Encounter for follow-up examination after completed treatment for conditions other than malignant neoplasm: Secondary | ICD-10-CM

## 2022-07-09 DIAGNOSIS — I152 Hypertension secondary to endocrine disorders: Secondary | ICD-10-CM

## 2022-07-09 DIAGNOSIS — I6529 Occlusion and stenosis of unspecified carotid artery: Secondary | ICD-10-CM | POA: Diagnosis not present

## 2022-07-09 DIAGNOSIS — I69320 Aphasia following cerebral infarction: Secondary | ICD-10-CM

## 2022-07-09 DIAGNOSIS — E1159 Type 2 diabetes mellitus with other circulatory complications: Secondary | ICD-10-CM | POA: Diagnosis not present

## 2022-07-09 DIAGNOSIS — Z7902 Long term (current) use of antithrombotics/antiplatelets: Secondary | ICD-10-CM

## 2022-07-09 DIAGNOSIS — I69391 Dysphagia following cerebral infarction: Secondary | ICD-10-CM

## 2022-07-09 DIAGNOSIS — I69359 Hemiplegia and hemiparesis following cerebral infarction affecting unspecified side: Secondary | ICD-10-CM

## 2022-07-09 MED ORDER — CLOPIDOGREL BISULFATE 75 MG PO TABS: 75.0000 mg | ORAL_TABLET | Freq: Every day | ORAL | 3 refills | Status: DC

## 2022-07-09 MED ORDER — REPATHA SURECLICK 140 MG/ML ~~LOC~~ SOAJ: SUBCUTANEOUS | 6 refills | Status: DC

## 2022-07-09 MED ORDER — AMLODIPINE BESYLATE 5 MG PO TABS: 5.0000 mg | ORAL_TABLET | Freq: Every day | ORAL | 3 refills | Status: DC

## 2022-07-09 NOTE — Patient Instructions (Addendum)
   Medication Instructions:  No changes  If you need a refill on your cardiac medications before your next appointment, please call your pharmacy.   Lab work: No new labs needed  Testing/Procedures: Carotid ultrasound end of the summer for carotid stenosis on left Your physician has requested that you have a carotid duplex. This test is an ultrasound of the carotid arteries in your neck. It looks at blood flow through these arteries that supply the brain with blood. Allow one hour for this exam. There are no restrictions or special instructions.   Follow-Up: At [facility], you and your health needs are our priority.  As part of our continuing mission to provide you with exceptional heart care, we have created designated Provider Care Teams.  These Care Teams include your primary Cardiologist (physician) and Advanced Practice Providers (APPs -  Physician Assistants and Nurse Practitioners) who all work together to provide you with the care you need, when you need it.  You will need a follow up appointment in 12 months  Providers on your designated Care Team:   Nicolasa Ducking, NP Eula Listen, PA-C Cadence Fransico Michael, [place]  COVID-19 Vaccine Information can be found at: [email or web address] For questions related to vaccine distribution or appointments, please email [email or web address] or call [phone].

## 2022-07-10 ENCOUNTER — Ambulatory Visit: Payer: Medicare Other | Admitting: Speech Pathology

## 2022-07-10 ENCOUNTER — Ambulatory Visit: Payer: Medicare Other

## 2022-07-10 ENCOUNTER — Ambulatory Visit: Payer: Medicare Other | Admitting: Occupational Therapy

## 2022-07-12 ENCOUNTER — Ambulatory Visit: Payer: Medicare Other

## 2022-07-12 ENCOUNTER — Ambulatory Visit: Payer: Medicare Other | Admitting: Occupational Therapy

## 2022-07-12 ENCOUNTER — Ambulatory Visit: Payer: Medicare Other | Admitting: Speech Pathology

## 2022-07-17 ENCOUNTER — Ambulatory Visit: Payer: Medicare Other | Admitting: Speech Pathology

## 2022-07-17 ENCOUNTER — Ambulatory Visit: Payer: Medicare Other | Admitting: Occupational Therapy

## 2022-07-17 ENCOUNTER — Ambulatory Visit: Payer: Medicare Other

## 2022-07-19 ENCOUNTER — Ambulatory Visit: Payer: Medicare Other | Admitting: Speech Pathology

## 2022-07-19 ENCOUNTER — Ambulatory Visit: Payer: Medicare Other

## 2022-07-19 ENCOUNTER — Ambulatory Visit: Payer: Medicare Other | Admitting: Occupational Therapy

## 2022-07-24 ENCOUNTER — Encounter: Payer: Medicare Other | Admitting: Speech Pathology

## 2022-07-24 ENCOUNTER — Encounter: Payer: Medicare Other | Admitting: Occupational Therapy

## 2022-07-26 ENCOUNTER — Encounter: Payer: Medicare Other | Admitting: Occupational Therapy

## 2022-07-26 ENCOUNTER — Encounter: Payer: Medicare Other | Admitting: Speech Pathology

## 2022-07-28 ENCOUNTER — Other Ambulatory Visit: Payer: Self-pay | Admitting: Family Medicine

## 2022-07-31 ENCOUNTER — Encounter: Payer: Medicare Other | Admitting: Occupational Therapy

## 2022-07-31 ENCOUNTER — Encounter: Payer: Medicare Other | Admitting: Speech Pathology

## 2022-08-02 ENCOUNTER — Encounter: Payer: Medicare Other | Admitting: Speech Pathology

## 2022-08-02 ENCOUNTER — Encounter: Payer: Medicare Other | Admitting: Occupational Therapy

## 2022-08-07 ENCOUNTER — Encounter: Payer: Medicare Other | Admitting: Speech Pathology

## 2022-08-07 ENCOUNTER — Encounter: Payer: Medicare Other | Admitting: Occupational Therapy

## 2022-08-07 NOTE — Progress Notes (Unsigned)
Established Patient Office Visit  Name: Gerald Bauer   MRN: [id number]    DOB: 12-17-1945   Date:08/09/2022  Today's Provider: Jacquelin Hawking, MHS, PA-C Introduced myself to the patient as a PA-C and provided education on APPs in clinical practice.         Subjective  Chief Complaint  Chief Complaint  Patient presents with   Follow-up    HPI   HYPERTENSION / HYPERLIPIDEMIA Satisfied with current treatment? yes Duration of hypertension: years BP monitoring frequency: a few times a week BP range: 154/70  BP medication side effects: no Past BP meds: amlodipine Duration of hyperlipidemia: years Cholesterol medication side effects: no Cholesterol supplements: none Past cholesterol medications:  On Repatha  Medication compliance: excellent compliance Aspirin: yes Recent stressors: no Recurrent headaches: no Visual changes: no Palpitations: yes one episode last Tuesday, reports he was active when this happened  Dyspnea: no Chest pain: no Lower extremity edema: no Dizzy/lightheaded: no  Diabetes, Type 2 - Last A1c 6.4 - Medications: Metformin 500 mg PO QD  - Compliance: Good compliance  - Checking BG at home: Not checking  - Diet: normal dietary intake  - Exercise: exercising with walking  - Eye exam: UTD - Foot exam: UTD - Microalbumin: ordered today  - Statin: No- on Repatha  - PNA vaccine: Completed  - Denies symptoms of hypoglycemia, polyuria, polydipsia, numbness extremities, foot ulcers/trauma   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  He is following up with Neurology and Cardiology regularly  He was discharged from Outpatient OT on 06/15/22 after meeting goals of rehab therapy  He was discharged from Outpatient SLP on 06/14/22 after meeting most of his goals and reporting satisfaction with his functional level  He is currently taking Plavix, repatha, and aspirin for longterm management and prevention   GERD He  denies problems with heartburn He is not taking anything regularly for prevention Reports he has TUMS   Patient Active Problem List   Diagnosis Date Noted   Claustrophobia 04/19/2022   H/O prostatectomy 02/06/2022   Hemiparesis, aphasia, 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) 11/07/2021   Lower urinary tract symptoms (LUTS) 11/07/2021   Myalgia due to statin 02/21/2021   Type 2 diabetes mellitus without complication, without long-term current use of insulin (HCC) 02/21/2021   Aortic atherosclerosis (HCC) 04/27/2020   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   Onychomycosis 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) 07/19/2016   Carotid stenosis 03/13/2016   Hyperlipidemia associated with type 2 diabetes mellitus (HCC)    Hypertension associated with type 2 diabetes mellitus (HCC)    Personal history of prostate cancer    Carotid atherosclerosis    GERD (gastroesophageal reflux disease) 08/20/2013    Past Surgical History:  Procedure Laterality Date   APPENDECTOMY  1973   CHOLECYSTECTOMY  2013   PROSTATECTOMY  2012    Family History  Problem Relation Age of Onset   Dementia Mother    High Cholesterol Mother    Hypertension Mother    Arthritis Mother    Hyperlipidemia Mother    Heart disease Father    Heart attack Father    Arthritis Maternal Grandmother    Cancer Maternal Grandmother        colon   Prostate cancer Brother    Cancer Brother        prostate   Cancer Maternal Uncle  colon   COPD Maternal Uncle    Stroke Paternal Grandmother    Diabetes Paternal Grandmother    Aneurysm Paternal Grandfather     Social History   Tobacco Use   Smoking status: Former    Packs/day: 1.00    Years: 40.00    Additional pack years: 0.00    Total pack years: 40.00    Types: Cigarettes    Quit date: 03/09/1997    Years since quitting: 25.4   Smokeless tobacco: Former   Tobacco comments:    smoking cessation materials not required  Substance Use Topics    Alcohol use: No    Alcohol/week: 0.0 standard drinks of alcohol     Current Outpatient Medications:    acetaminophen (TYLENOL) 325 MG tablet, Take 2 tablets (650 mg total) by mouth every 4 (four) hours as needed for mild pain (or temp > 37.5 C (99.5 F)). (Patient taking differently: Take 650 mg by mouth as needed for mild pain (or temp > 37.5 C (99.5 F)).), Disp: , Rfl:    amLODipine (NORVASC) 5 MG tablet, Take 1 tablet (5 mg total) by mouth daily., Disp: 90 tablet, Rfl: 3   aspirin EC 81 MG tablet, Take 81 mg by mouth daily. Swallow whole., Disp: , Rfl:    Cholecalciferol (VITAMIN D3) 25 MCG (1000 UT) CAPS, Take 1,000 Units by mouth daily., Disp: , Rfl:    clopidogrel (PLAVIX) 75 MG tablet, Take 1 tablet (75 mg total) by mouth daily., Disp: 90 tablet, Rfl: 3   Cyanocobalamin (VITAMIN B12) 500 MCG TABS, Take 500 mcg by mouth daily., Disp: , Rfl:    Evolocumab (REPATHA SURECLICK) 140 MG/ML SOAJ, Inject 140 mg Subcutaneously every 2 weeks., Disp: 2 mL, Rfl: 6   Lancets (ONETOUCH DELICA PLUS LANCET33G) MISC, USE WITH METER TO CHECK BLOOD  GLUCOSE ONCE DAILY AS DIRECTED, Disp: 100 each, Rfl: 3   metFORMIN (GLUCOPHAGE-XR) 500 MG 24 hr tablet, Take 500 mg by mouth daily with breakfast., Disp: , Rfl:    ONETOUCH VERIO test strip, CHECK FINGERSTICK BLOOD SUGARS  ONCE DAILY, Disp: 100 strip, Rfl: 3  Allergies  Allergen Reactions   Amoxicillin Swelling and Other (See Comments)   Statins Other (See Comments)    Affects the liver   Welchol [Colesevelam Hcl] Other (See Comments)    affects the liver    I personally reviewed active problem list, medication list, allergies, health maintenance, notes from last encounter, notes from last 4 encounters, lab results with the patient/caregiver today.   Review of Systems  Constitutional:  Negative for chills and fever.  HENT:  Negative for congestion.   Eyes:  Negative for blurred vision and double vision.  Respiratory:  Negative for shortness of  breath and wheezing.   Cardiovascular:  Positive for palpitations. Negative for chest pain and leg swelling.  Gastrointestinal:  Negative for heartburn.  Musculoskeletal:  Negative for falls (stumbled last month but did not hit floor).  Neurological:  Positive for tingling (right side, particularly feet) and weakness (right sided). Negative for dizziness, tremors and headaches.  Psychiatric/Behavioral:  Negative for depression, hallucinations and memory loss. The patient is not nervous/anxious and does not have insomnia.       Objective  Vitals:   08/08/22 1026  BP: 126/80  Pulse: 98  Resp: 16  Temp: 97.9 F (36.6 C)  SpO2: 98%  Weight: 169 lb (76.7 kg)  Height: 5\' 7"  (1.702 m)    Body mass index is 26.47 kg/m.  Physical  Exam Vitals reviewed.  Constitutional:      General: He is awake.     Appearance: Normal appearance. He is well-developed and well-groomed.  HENT:     Head: Normocephalic and atraumatic.  Eyes:     Extraocular Movements: Extraocular movements intact.     Conjunctiva/sclera: Conjunctivae normal.  Cardiovascular:     Rate and Rhythm: Normal rate and regular rhythm.     Pulses: Normal pulses.     Heart sounds: Normal heart sounds. No murmur heard.    No friction rub. No gallop.  Pulmonary:     Effort: Pulmonary effort is normal.     Breath sounds: Normal breath sounds. No wheezing, rhonchi or rales.  Neurological:     Mental Status: He is alert.     Cranial Nerves: Dysarthria present.     Motor: No weakness.     Gait: Gait abnormal.     Comments: Walking with cane and mild right sided deficits Patient struggles with aphasia and apraxia but has compensation techniques and can communicate moderately effectively  Psychiatric:        Attention and Perception: Attention normal.        Mood and Affect: Mood and affect normal.        Behavior: Behavior normal. Behavior is cooperative.      Recent Results (from the past 2160 hour(s))  COMPLETE  METABOLIC PANEL WITH GFR     Status: Abnormal   Collection Time: 08/08/22 10:59 AM  Result Value Ref Range   Glucose, Bld 163 (H) 65 - 99 mg/dL    Comment: .            Fasting reference interval . For someone without known diabetes, a glucose value >125 mg/dL indicates that they may have diabetes and this should be confirmed with a follow-up test. .    BUN 12 7 - 25 mg/dL   Creat 1.61 0.96 - 0.45 mg/dL   eGFR 91 > OR = 60 WU/JWJ/1.91Y7   BUN/Creatinine Ratio SEE NOTE: 6 - 22 (calc)    Comment:    Not Reported: BUN and Creatinine are within    reference range. .    Sodium 140 135 - 146 mmol/L   Potassium 4.1 3.5 - 5.3 mmol/L   Chloride 102 98 - 110 mmol/L   CO2 28 20 - 32 mmol/L   Calcium 9.5 8.6 - 10.3 mg/dL   Total Protein 6.8 6.1 - 8.1 g/dL   Albumin 4.3 3.6 - 5.1 g/dL   Globulin 2.5 1.9 - 3.7 g/dL (calc)   AG Ratio 1.7 1.0 - 2.5 (calc)   Total Bilirubin 0.5 0.2 - 1.2 mg/dL   Alkaline phosphatase (APISO) 62 35 - 144 U/L   AST 11 10 - 35 U/L   ALT 12 9 - 46 U/L  CBC w/Diff/Platelet     Status: None   Collection Time: 08/08/22 10:59 AM  Result Value Ref Range   WBC 6.6 3.8 - 10.8 Thousand/uL   RBC 4.70 4.20 - 5.80 Million/uL   Hemoglobin 14.6 13.2 - 17.1 g/dL   HCT 82.9 56.2 - 13.0 %   MCV 89.6 80.0 - 100.0 fL   MCH 31.1 27.0 - 33.0 pg   MCHC 34.7 32.0 - 36.0 g/dL   RDW 86.5 78.4 - 69.6 %   Platelets 252 140 - 400 Thousand/uL   MPV 9.7 7.5 - 12.5 fL   Neutro Abs 3,703 1,500 - 7,800 cells/uL   Lymphs Abs 2,198 850 - 3,900  cells/uL   Absolute Monocytes 508 200 - 950 cells/uL   Eosinophils Absolute 119 15 - 500 cells/uL   Basophils Absolute 73 0 - 200 cells/uL   Neutrophils Relative % 56.1 %   Total Lymphocyte 33.3 %   Monocytes Relative 7.7 %   Eosinophils Relative 1.8 %   Basophils Relative 1.1 %  Lipid Profile     Status: None   Collection Time: 08/08/22 10:59 AM  Result Value Ref Range   Cholesterol 125 <200 mg/dL   HDL 48 > OR = 40 mg/dL    Triglycerides 161 <096 mg/dL   LDL Cholesterol (Calc) 55 mg/dL (calc)    Comment: Reference range: <100 . Desirable range <100 mg/dL for primary prevention;   <70 mg/dL for patients with CHD or diabetic patients  with > or = 2 CHD risk factors. Marland Kitchen LDL-C is now calculated using the Martin-Hopkins  calculation, which is a validated novel method providing  better accuracy than the Friedewald equation in the  estimation of LDL-C.  Horald Pollen et al. Lenox Ahr. 0454;098(11): 2061-2068  (http://education.[facility].com/faq/FAQ164)    Total CHOL/HDL Ratio 2.6 <5.0 (calc)   Non-HDL Cholesterol (Calc) 77 <914 mg/dL (calc)    Comment: For patients with diabetes plus 1 major ASCVD risk  factor, treating to a non-HDL-C goal of <100 mg/dL  (LDL-C of <78 mg/dL) is considered a therapeutic  option.   HgB A1c     Status: Abnormal   Collection Time: 08/08/22 10:59 AM  Result Value Ref Range   Hgb A1c MFr Bld 7.6 (H) <5.7 % of total Hgb    Comment: For someone without known diabetes, a hemoglobin A1c value of 6.5% or greater indicates that they may have  diabetes and this should be confirmed with a follow-up  test. . For someone with known diabetes, a value <7% indicates  that their diabetes is well controlled and a value  greater than or equal to 7% indicates suboptimal  control. A1c targets should be individualized based on  duration of diabetes, age, comorbid conditions, and  other considerations. . Currently, no consensus exists regarding use of hemoglobin A1c for diagnosis of diabetes for children. .    Mean Plasma Glucose 171 mg/dL   eAG (mmol/L) 9.5 mmol/L    Comment: . This test was performed on the Roche cobas c503 platform. Effective 01/01/22, a change in test platforms from the [employer] Architect to the Roche cobas c503 may have shifted HbA1c results compared to historical results. Based on laboratory validation testing conducted at [facility], the Roche platform relative to the  Abbott platform had an average increase in HbA1c value of < or = 0.3%. This difference is within accepted  variability established by the [facility]. Note that not all individuals will have had a shift in their results and direct comparisons between historical and current results for testing conducted on different platforms is not recommended.   Microalbumin / creatinine urine ratio     Status: None   Collection Time: 08/08/22 10:59 AM  Result Value Ref Range   Creatinine, Urine 183 20 - 320 mg/dL   Microalb, Ur 0.8 mg/dL    Comment: Reference Range Not established    Microalb Creat Ratio 4 <30 mg/g creat    Comment: . The ADA defines abnormalities in albumin excretion as follows: Marland Kitchen Albuminuria Category        Result (mg/g creatinine) . Normal to Mildly increased   <30 Moderately increased  30-299  Severely increased           > OR = 300 . The ADA recommends that at least two of three specimens collected within a 3-6 month period be abnormal before considering a patient to be within a diagnostic category.      PHQ2/9:    08/08/2022   10:27 AM 02/07/2022    1:27 PM 12/15/2021   11:27 AM 11/07/2021    1:43 PM 09/14/2021    1:30 PM  Depression screen PHQ 2/9  Decreased Interest 0 1 0 1 0  Down, Depressed, Hopeless 0 0 0 2 0  PHQ - 2 Score 0 1 0 3 0  Altered sleeping 0 0 0 1   Tired, decreased energy 0 0 0 0   Change in appetite 0 0 0 0   Feeling bad or failure about yourself  0 0 0 0   Trouble concentrating 0 0 0 0   Moving slowly or fidgety/restless 0 0 0 0   Suicidal thoughts 0 0 0 0   PHQ-9 Score 0 1 0 4   Difficult doing work/chores Not difficult at all Not difficult at all Not difficult at all Not difficult at all       Fall Risk:    08/08/2022   10:27 AM 02/07/2022    1:26 PM 12/15/2021   11:26 AM 11/07/2021    1:38 PM 09/14/2021    1:30 PM  Fall Risk   Falls in the past year? 1 1  1  0  Number falls in past  yr: 1 1 1 1  0  Injury with Fall? 1 1 1 1  0  Comment   Hurt Right Wrist    Risk for fall due to :  Impaired balance/gait  Impaired balance/gait;Impaired mobility   Follow up  Falls prevention discussed;Education provided;Falls evaluation completed  Falls prevention discussed Falls evaluation completed      Functional Status Survey: Is the patient deaf or have difficulty hearing?: No Does the patient have difficulty seeing, even when wearing glasses/contacts?: No Does the patient have difficulty concentrating, remembering, or making decisions?: No Does the patient have difficulty walking or climbing stairs?: Yes Does the patient have difficulty dressing or bathing?: No Does the patient have difficulty doing errands alone such as visiting a doctor's office or shopping?: Yes    Assessment & Plan  Problem List Items Addressed This Visit       Cardiovascular and Mediastinum   Hypertension associated with type 2 diabetes mellitus (HCC)    Chronic, historic condition Blood pressure reading in office is within goal but patient reports systolics in the 150s at home.  Given his speech difficulty I am unsure as to accurate reporting. He is currently taking amlodipine 5 mg p.o. daily and denies concerns for side effects today. Recommend he continue this medication as directed continue to monitor blood pressure at home.  Recommend keeping a record of home blood pressures so that he can review these with PCP at next follow-up appointment. Recommend he continues to stay active as tolerable. Recommend routine follow-up in 6 months or sooner if concerns arise      Relevant Orders   CBC w/Diff/Platelet (Completed)   Aortic atherosclerosis (HCC)    Chronic, historic condition Patient stopped taking Repatha to assist with management. Will check lipid profile today for monitoring Results to dictate further management Follow-up in 6 months for routine monitoring      Relevant Orders   Lipid  Profile (Completed)  Digestive   GERD (gastroesophageal reflux disease)    Discharge condition, apparently resolved at this time Patient reports that he is not taking any preventative medications for GERD symptoms as he has not had these in some time He reports that he has Tums at home to assist with management as needed Symptoms have largely resolved recommend follow-up as needed for concerning symptoms should they recur         Endocrine   Hyperlipidemia associated with type 2 diabetes mellitus (HCC)    Chronic, historic condition Patient has a history of statin myopathy and is taking Repatha.  This is managed by cardiology. Will repeat lipid panel today for monitoring.  Results to dictate further management. He appears to have regular follow-up with cardiology for management but recommend he see his PCP at least every 6 months for monitoring      Relevant Orders   Lipid Profile (Completed)   Type 2 diabetes mellitus without complication, without long-term current use of insulin (HCC) - Primary    Chronic, historic condition Patient's most recent A1c was 6.4 but will recheck today for monitoring. He is currently taking metformin 500 mg p.o. daily appears to be tolerating well He reports normal dietary intake and does not appear to be following diabetes centric meal plan.  He reports that he is exercising by walking every day. Results of A1c today to dictate further management but for now recommend he continue his metformin dosing Follow up every 3-6 months depending on today's A1c results       Relevant Orders   COMPLETE METABOLIC PANEL WITH GFR (Completed)   HgB A1c (Completed)   Urine Microalbumin w/creat. 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  Chart review demonstrates that he was discharged from outpatient OT on  06/15/2022 after meeting his rehab goals.  He was also discharged from outpatient SLP on 06/14/2022 after meeting most of his goals and reported satisfaction with functional level. He is currently taking Plavix, Repatha, aspirin per cardiology and neurology recommendations. Recommend that he continues close follow-up with cardiology and neurology for continued management. Recommend routine follow-up at least every 6 months to discuss overall functional level as well as evolving needs.        No follow-ups on file.   I, Toia Micale E Henryetta Corriveau, PA-C, have reviewed all documentation for this visit. The documentation on 08/09/22 for the exam, diagnosis, procedures, and orders are all accurate and complete.   Jacquelin Hawking, MHS, PA-C [facility]

## 2022-08-08 ENCOUNTER — Ambulatory Visit (INDEPENDENT_AMBULATORY_CARE_PROVIDER_SITE_OTHER): Payer: Medicare Other | Admitting: Physician Assistant

## 2022-08-08 ENCOUNTER — Encounter: Payer: Self-pay | Admitting: Physician Assistant

## 2022-08-08 VITALS — BP 126/80 | HR 98 | Temp 97.9°F | Resp 16 | Ht 67.0 in | Wt 169.0 lb

## 2022-08-08 DIAGNOSIS — E1159 Type 2 diabetes mellitus with other circulatory complications: Secondary | ICD-10-CM | POA: Diagnosis not present

## 2022-08-08 DIAGNOSIS — E785 Hyperlipidemia, unspecified: Secondary | ICD-10-CM

## 2022-08-08 DIAGNOSIS — I69359 Hemiplegia and hemiparesis following cerebral infarction affecting unspecified side: Secondary | ICD-10-CM

## 2022-08-08 DIAGNOSIS — E1169 Type 2 diabetes mellitus with other specified complication: Secondary | ICD-10-CM | POA: Diagnosis not present

## 2022-08-08 DIAGNOSIS — Z7984 Long term (current) use of oral hypoglycemic drugs: Secondary | ICD-10-CM

## 2022-08-08 DIAGNOSIS — E119 Type 2 diabetes mellitus without complications: Secondary | ICD-10-CM

## 2022-08-08 DIAGNOSIS — I69320 Aphasia following cerebral infarction: Secondary | ICD-10-CM

## 2022-08-08 DIAGNOSIS — I152 Hypertension secondary to endocrine disorders: Secondary | ICD-10-CM

## 2022-08-08 DIAGNOSIS — K219 Gastro-esophageal reflux disease without esophagitis: Secondary | ICD-10-CM

## 2022-08-08 DIAGNOSIS — I69391 Dysphagia following cerebral infarction: Secondary | ICD-10-CM | POA: Diagnosis not present

## 2022-08-08 DIAGNOSIS — I700 Atherosclerosis of aorta: Secondary | ICD-10-CM

## 2022-08-08 LAB — CBC WITH DIFFERENTIAL/PLATELET
Basophils Absolute: 73 cells/uL (ref 0–200)
Basophils Relative: 1.1 %
HCT: 42.1 % (ref 38.5–50.0)
MCH: 31.1 pg (ref 27.0–33.0)
MPV: 9.7 fL (ref 7.5–12.5)
Platelets: 252 10*3/uL (ref 140–400)
RBC: 4.7 10*6/uL (ref 4.20–5.80)
RDW: 11.9 % (ref 11.0–15.0)
Total Lymphocyte: 33.3 %

## 2022-08-09 ENCOUNTER — Encounter: Payer: Medicare Other | Admitting: Speech Pathology

## 2022-08-09 ENCOUNTER — Encounter: Payer: Medicare Other | Admitting: Occupational Therapy

## 2022-08-09 LAB — COMPLETE METABOLIC PANEL WITH GFR
AG Ratio: 1.7 (calc) (ref 1.0–2.5)
ALT: 12 U/L (ref 9–46)
AST: 11 U/L (ref 10–35)
Albumin: 4.3 g/dL (ref 3.6–5.1)
Alkaline phosphatase (APISO): 62 U/L (ref 35–144)
BUN: 12 mg/dL (ref 7–25)
CO2: 28 mmol/L (ref 20–32)
Calcium: 9.5 mg/dL (ref 8.6–10.3)
Chloride: 102 mmol/L (ref 98–110)
Creat: 0.8 mg/dL (ref 0.70–1.28)
Globulin: 2.5 g/dL (calc) (ref 1.9–3.7)
Glucose, Bld: 163 mg/dL — ABNORMAL HIGH (ref 65–99)
Potassium: 4.1 mmol/L (ref 3.5–5.3)
Sodium: 140 mmol/L (ref 135–146)
Total Bilirubin: 0.5 mg/dL (ref 0.2–1.2)
Total Protein: 6.8 g/dL (ref 6.1–8.1)
eGFR: 91 mL/min/{1.73_m2} (ref >=60)

## 2022-08-09 LAB — LIPID PANEL
Cholesterol: 125 mg/dL (ref <200)
HDL: 48 mg/dL (ref >=40)
LDL Cholesterol (Calc): 55 mg/dL (calc)
Non-HDL Cholesterol (Calc): 77 mg/dL (calc) (ref <130)
Total CHOL/HDL Ratio: 2.6 (calc) (ref <5.0)
Triglycerides: 140 mg/dL (ref <150)

## 2022-08-09 LAB — CBC WITH DIFFERENTIAL/PLATELET
Absolute Monocytes: 508 cells/uL (ref 200–950)
Eosinophils Absolute: 119 cells/uL (ref 15–500)
Eosinophils Relative: 1.8 %
Hemoglobin: 14.6 g/dL (ref 13.2–17.1)
Lymphs Abs: 2198 cells/uL (ref 850–3900)
MCHC: 34.7 g/dL (ref 32.0–36.0)
MCV: 89.6 fL (ref 80.0–100.0)
Monocytes Relative: 7.7 %
Neutro Abs: 3703 cells/uL (ref 1500–7800)
Neutrophils Relative %: 56.1 %
WBC: 6.6 10*3/uL (ref 3.8–10.8)

## 2022-08-09 LAB — MICROALBUMIN / CREATININE URINE RATIO
Creatinine, Urine: 183 mg/dL (ref 20–320)
Microalb Creat Ratio: 4 mg/g creat (ref <30)
Microalb, Ur: 0.8 mg/dL

## 2022-08-09 LAB — HEMOGLOBIN A1C
Hgb A1c MFr Bld: 7.6 % of total Hgb — ABNORMAL HIGH (ref <5.7)
Mean Plasma Glucose: 171 mg/dL
eAG (mmol/L): 9.5 mmol/L

## 2022-08-09 NOTE — Progress Notes (Signed)
Your labs are back and overall look pretty good with the exception of your A1c. Your A1c has increased to 7.6 which is over the recommended goal of 7.0 for diabetics. I recommend increasing your Metformin 500 mg to twice per day to help improve this.  Your cholesterol looks great. Your electrolytes, liver and kidney function were in normal limits. Let us know if you are comfortable with increasing your metformin and if you need refills to help with this adjustment.

## 2022-08-09 NOTE — Assessment & Plan Note (Signed)
Chronic, ongoing concern due to left MCA stroke in July 2023  He has ongoing speech and language deficits.  Chart review demonstrates that he was discharged from outpatient OT on 06/15/2022 after meeting his rehab goals.  He was also discharged from outpatient SLP on 06/14/2022 after meeting most of his goals and reported satisfaction with functional level. He is currently taking Plavix, Repatha, aspirin per cardiology and neurology recommendations. Recommend that he continues close follow-up with cardiology and neurology for continued management. Recommend routine follow-up at least every 6 months to discuss overall functional level as well as evolving needs.

## 2022-08-09 NOTE — Assessment & Plan Note (Signed)
Chronic, historic condition Patient has a history of statin myopathy and is taking Repatha.  This is managed by cardiology. Will repeat lipid panel today for monitoring.  Results to dictate further management. He appears to have regular follow-up with cardiology for management but recommend he see his PCP at least every 6 months for monitoring

## 2022-08-09 NOTE — Assessment & Plan Note (Signed)
Discharge condition, apparently resolved at this time Patient reports that he is not taking any preventative medications for GERD symptoms as he has not had these in some time He reports that he has Tums at home to assist with management as needed Symptoms have largely resolved recommend follow-up as needed for concerning symptoms should they recur

## 2022-08-09 NOTE — Assessment & Plan Note (Signed)
Chronic, historic condition Blood pressure reading in office is within goal but patient reports systolics in the 150s at home.  Given his speech difficulty I am unsure as to accurate reporting. He is currently taking amlodipine 5 mg p.o. daily and denies concerns for side effects today. Recommend he continue this medication as directed continue to monitor blood pressure at home.  Recommend keeping a record of home blood pressures so that he can review these with PCP at next follow-up appointment. Recommend he continues to stay active as tolerable. Recommend routine follow-up in 6 months or sooner if concerns arise

## 2022-08-09 NOTE — Assessment & Plan Note (Signed)
Chronic, historic condition Patient stopped taking Repatha to assist with management. Will check lipid profile today for monitoring Results to dictate further management Follow-up in 6 months for routine monitoring

## 2022-08-09 NOTE — Assessment & Plan Note (Signed)
Chronic, historic condition Patient's most recent A1c was 6.4 but will recheck today for monitoring. He is currently taking metformin 500 mg p.o. daily appears to be tolerating well He reports normal dietary intake and does not appear to be following diabetes centric meal plan.  He reports that he is exercising by walking every day. Results of A1c today to dictate further management but for now recommend he continue his metformin dosing Follow up every 3-6 months depending on today's A1c results

## 2022-08-14 ENCOUNTER — Encounter: Payer: Medicare Other | Admitting: Speech Pathology

## 2022-08-14 ENCOUNTER — Encounter: Payer: Medicare Other | Admitting: Occupational Therapy

## 2022-08-16 ENCOUNTER — Encounter: Payer: Medicare Other | Admitting: Speech Pathology

## 2022-08-16 ENCOUNTER — Encounter: Payer: Medicare Other | Admitting: Occupational Therapy

## 2022-08-16 NOTE — Progress Notes (Signed)
Metformin dosing updated to PO BID with meals in chart. Refills to be provided as needed

## 2022-08-16 NOTE — Addendum Note (Signed)
Addended by: Jacquelin Hawking on: 08/16/2022 09:45 AM   Modules accepted: Orders

## 2022-08-19 ENCOUNTER — Other Ambulatory Visit: Payer: Self-pay | Admitting: Family Medicine

## 2022-08-19 DIAGNOSIS — R399 Unspecified symptoms and signs involving the genitourinary system: Secondary | ICD-10-CM

## 2022-08-21 ENCOUNTER — Encounter: Payer: Medicare Other | Admitting: Speech Pathology

## 2022-08-21 ENCOUNTER — Encounter: Payer: Medicare Other | Admitting: Occupational Therapy

## 2022-08-23 ENCOUNTER — Encounter: Payer: Medicare Other | Admitting: Speech Pathology

## 2022-08-23 ENCOUNTER — Encounter: Payer: Medicare Other | Admitting: Occupational Therapy

## 2022-08-28 ENCOUNTER — Encounter: Payer: Medicare Other | Admitting: Speech Pathology

## 2022-08-28 ENCOUNTER — Encounter: Payer: Medicare Other | Admitting: Occupational Therapy

## 2022-08-30 ENCOUNTER — Encounter: Payer: Medicare Other | Admitting: Speech Pathology

## 2022-08-30 ENCOUNTER — Encounter: Payer: Medicare Other | Admitting: Occupational Therapy

## 2022-09-04 ENCOUNTER — Encounter: Payer: Medicare Other | Admitting: Speech Pathology

## 2022-09-04 ENCOUNTER — Encounter: Payer: Medicare Other | Admitting: Occupational Therapy

## 2022-09-06 ENCOUNTER — Encounter: Payer: Medicare Other | Admitting: Speech Pathology

## 2022-09-06 ENCOUNTER — Encounter: Payer: Medicare Other | Admitting: Occupational Therapy

## 2022-09-11 ENCOUNTER — Encounter: Payer: Medicare Other | Admitting: Occupational Therapy

## 2022-09-11 ENCOUNTER — Encounter: Payer: Self-pay | Admitting: Cardiovascular Disease

## 2022-09-11 ENCOUNTER — Encounter: Payer: Medicare Other | Admitting: Speech Pathology

## 2022-09-13 ENCOUNTER — Encounter: Payer: Medicare Other | Admitting: Occupational Therapy

## 2022-09-13 ENCOUNTER — Encounter: Payer: Medicare Other | Admitting: Speech Pathology

## 2022-09-18 ENCOUNTER — Encounter: Payer: Medicare Other | Admitting: Occupational Therapy

## 2022-09-18 ENCOUNTER — Encounter: Payer: Medicare Other | Admitting: Speech Pathology

## 2022-09-20 ENCOUNTER — Encounter: Payer: Medicare Other | Admitting: Speech Pathology

## 2022-09-20 ENCOUNTER — Encounter: Payer: Medicare Other | Admitting: Occupational Therapy

## 2022-09-20 ENCOUNTER — Encounter: Payer: Medicare Other | Admitting: Family Medicine

## 2022-09-25 ENCOUNTER — Encounter: Payer: Medicare Other | Admitting: Speech Pathology

## 2022-09-25 ENCOUNTER — Encounter: Payer: Medicare Other | Admitting: Occupational Therapy

## 2022-10-02 ENCOUNTER — Encounter: Payer: Medicare Other | Admitting: Occupational Therapy

## 2022-10-02 ENCOUNTER — Encounter: Payer: Medicare Other | Admitting: Speech Pathology

## 2022-10-04 ENCOUNTER — Encounter: Payer: Medicare Other | Admitting: Occupational Therapy

## 2022-10-04 ENCOUNTER — Encounter: Payer: Medicare Other | Admitting: Speech Pathology

## 2022-10-09 ENCOUNTER — Encounter: Payer: Medicare Other | Admitting: Speech Pathology

## 2022-10-09 ENCOUNTER — Encounter: Payer: Medicare Other | Admitting: Occupational Therapy

## 2022-10-11 ENCOUNTER — Encounter: Payer: Medicare Other | Admitting: Occupational Therapy

## 2022-10-11 ENCOUNTER — Encounter: Payer: Medicare Other | Admitting: Speech Pathology

## 2022-10-17 ENCOUNTER — Other Ambulatory Visit: Payer: Self-pay | Admitting: Family Medicine

## 2022-10-19 NOTE — Telephone Encounter (Signed)
Lvm to return call to schedule appt in August to see [facility]

## 2022-10-30 ENCOUNTER — Encounter: Payer: Self-pay | Admitting: Family Medicine

## 2022-10-30 ENCOUNTER — Ambulatory Visit (INDEPENDENT_AMBULATORY_CARE_PROVIDER_SITE_OTHER): Payer: Medicare Other | Admitting: Family Medicine

## 2022-10-30 VITALS — BP 138/86 | HR 101 | Temp 98.0°F | Resp 16 | Ht 67.0 in | Wt 172.2 lb

## 2022-10-30 DIAGNOSIS — I69320 Aphasia following cerebral infarction: Secondary | ICD-10-CM

## 2022-10-30 DIAGNOSIS — M791 Myalgia, unspecified site: Secondary | ICD-10-CM

## 2022-10-30 DIAGNOSIS — I69391 Dysphagia following cerebral infarction: Secondary | ICD-10-CM

## 2022-10-30 DIAGNOSIS — I69359 Hemiplegia and hemiparesis following cerebral infarction affecting unspecified side: Secondary | ICD-10-CM

## 2022-10-30 DIAGNOSIS — E1159 Type 2 diabetes mellitus with other circulatory complications: Secondary | ICD-10-CM

## 2022-10-30 DIAGNOSIS — E1169 Type 2 diabetes mellitus with other specified complication: Secondary | ICD-10-CM

## 2022-10-30 DIAGNOSIS — Z7984 Long term (current) use of oral hypoglycemic drugs: Secondary | ICD-10-CM

## 2022-10-30 DIAGNOSIS — E785 Hyperlipidemia, unspecified: Secondary | ICD-10-CM

## 2022-10-30 DIAGNOSIS — R000 Tachycardia, unspecified: Secondary | ICD-10-CM

## 2022-10-30 DIAGNOSIS — I152 Hypertension secondary to endocrine disorders: Secondary | ICD-10-CM

## 2022-10-30 DIAGNOSIS — T466X5A Adverse effect of antihyperlipidemic and antiarteriosclerotic drugs, initial encounter: Secondary | ICD-10-CM

## 2022-10-30 DIAGNOSIS — E119 Type 2 diabetes mellitus without complications: Secondary | ICD-10-CM

## 2022-10-30 NOTE — Progress Notes (Unsigned)
Name: Gerald Bauer   MRN: [id number]    DOB: 14-Aug-1945   Date:10/30/2022       Progress Note  Chief Complaint  Patient presents with   Follow-up   Diabetes   Medication Refill     Subjective:   Gerald Bauer is a 77 y.o. male, presents to clinic for routine f/up  Patient's A1c was elevated in diabetes uncontrolled, he has not been taking the metformin twice a day only once a day Lab Results  Component Value Date   HGBA1C 7.6 (H) 08/08/2022    Cholesterol has recently been checked and well-controlled on his current medications Lab Results  Component Value Date   CHOL 125 08/08/2022   HDL 48 08/08/2022   LDLCALC 55 08/08/2022   TRIG 140 08/08/2022   CHOLHDL 2.6 08/08/2022   His heart rate is a little bit fast today Pulse Readings from Last 3 Encounters:  10/30/22 (!) 101  08/08/22 98  07/09/22 95   Hypertension:  Currently managed on on Norvasc 5 mg Pt reports good med compliance and denies any SE.   Blood pressure today is well controlled. BP Readings from Last 3 Encounters:  10/30/22 138/86  08/08/22 126/80  07/09/22 130/76   Pt denies CP, SOB, exertional sx, LE edema, palpitation, Ha's, visual disturbances, lightheadedness, hypotension, syncope.     Current Outpatient Medications:    acetaminophen (TYLENOL) 325 MG tablet, Take 2 tablets (650 mg total) by mouth every 4 (four) hours as needed for mild pain (or temp > 37.5 C (99.5 F)). (Patient taking differently: Take 650 mg by mouth as needed for mild pain (or temp > 37.5 C (99.5 F)).), Disp: , Rfl:    amLODipine (NORVASC) 5 MG tablet, Take 1 tablet (5 mg total) by mouth daily., Disp: 90 tablet, Rfl: 3   aspirin EC 81 MG tablet, Take 81 mg by mouth daily. Swallow whole., Disp: , Rfl:    Cholecalciferol (VITAMIN D3) 25 MCG (1000 UT) CAPS, Take 1,000 Units by mouth daily., Disp: , Rfl:    clopidogrel (PLAVIX) 75 MG tablet, Take 1 tablet (75 mg total) by mouth daily., Disp: 90 tablet, Rfl: 3    Cyanocobalamin (VITAMIN B12) 500 MCG TABS, Take 500 mcg by mouth daily., Disp: , Rfl:    Evolocumab (REPATHA SURECLICK) 140 MG/ML SOAJ, Inject 140 mg Subcutaneously every 2 weeks., Disp: 2 mL, Rfl: 6   Lancets (ONETOUCH DELICA PLUS LANCET33G) MISC, USE WITH METER TO CHECK BLOOD  GLUCOSE ONCE DAILY AS DIRECTED, Disp: 100 each, Rfl: 3   metFORMIN (GLUCOPHAGE-XR) 500 MG 24 hr tablet, TAKE 1 TABLET BY MOUTH TWICE  DAILY WITH MEALS, Disp: 180 tablet, Rfl: 0   ONETOUCH VERIO test strip, CHECK FINGERSTICK BLOOD SUGARS  ONCE DAILY, Disp: 100 strip, Rfl: 3  Patient Active Problem List   Diagnosis Date Noted   Claustrophobia 04/19/2022   H/O prostatectomy 02/06/2022   Hemiparesis, aphasia, 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) 11/07/2021   Lower urinary tract symptoms (LUTS) 11/07/2021   Myalgia due to statin 02/21/2021   Type 2 diabetes mellitus without complication, without long-term current use of insulin (HCC) 02/21/2021   Aortic atherosclerosis (HCC) 04/27/2020   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   Onychomycosis 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) 07/19/2016   Carotid stenosis 03/13/2016   Hyperlipidemia associated with type 2 diabetes mellitus (HCC)    Hypertension associated with type 2 diabetes mellitus (HCC)    Personal history of prostate cancer  Carotid atherosclerosis    GERD (gastroesophageal reflux disease) 08/20/2013    Past Surgical History:  Procedure Laterality Date   APPENDECTOMY  1973   CHOLECYSTECTOMY  2013   PROSTATECTOMY  2012    Family History  Problem Relation Age of Onset   Dementia Mother    High Cholesterol Mother    Hypertension Mother    Arthritis Mother    Hyperlipidemia Mother    Heart disease Father    Heart attack Father    Arthritis Maternal Grandmother    Cancer Maternal Grandmother        colon   Prostate cancer Brother    Cancer Brother        prostate   Cancer Maternal Uncle        colon   COPD Maternal Uncle     Stroke Paternal Grandmother    Diabetes Paternal Grandmother    Aneurysm Paternal Grandfather     Social History   Tobacco Use   Smoking status: Former    Current packs/day: 0.00    Average packs/day: 1 pack/day for 40.0 years (40.0 ttl pk-yrs)    Types: Cigarettes    Start date: 03/09/1957    Quit date: 03/09/1997    Years since quitting: 25.6   Smokeless tobacco: Former   Tobacco comments:    smoking cessation materials not required  Vaping Use   Vaping status: Never Used  Substance Use Topics   Alcohol use: No    Alcohol/week: 0.0 standard drinks of alcohol   Drug use: No     Allergies  Allergen Reactions   Amoxicillin Swelling and Other (See Comments)   Statins Other (See Comments)    Affects the liver   Welchol [Colesevelam Hcl] Other (See Comments)    affects the liver    Health Maintenance  Topic Date Due   Medicare Annual Wellness (AWV)  09/15/2022   INFLUENZA VACCINE  10/25/2022   Colonoscopy  11/08/2022 (Originally 10/01/2018)   COVID-19 Vaccine (2 - Janssen risk series) 11/15/2022 (Originally 10/06/2019)   HEMOGLOBIN A1C  02/08/2023   OPHTHALMOLOGY EXAM  03/10/2023   FOOT EXAM  05/08/2023   Diabetic kidney evaluation - eGFR measurement  08/08/2023   Diabetic kidney evaluation - Urine ACR  08/08/2023   DTaP/Tdap/Td (2 - Tdap) 03/04/2024   Pneumonia Vaccine 75+ Years old  Completed   Hepatitis C Screening  Completed   Zoster Vaccines- Shingrix  Completed   HPV VACCINES  Aged Out    Chart Review Today: I personally reviewed active problem list, medication list, allergies, family history, social history, health maintenance, notes from last encounter, lab results, imaging with the patient/caregiver today.   Review of Systems  Constitutional: Negative.   HENT: Negative.    Eyes: Negative.   Respiratory: Negative.    Cardiovascular: Negative.   Gastrointestinal: Negative.   Endocrine: Negative.   Genitourinary: Negative.   Musculoskeletal: Negative.    Skin: Negative.   Allergic/Immunologic: Negative.   Neurological: Negative.   Hematological: Negative.   Psychiatric/Behavioral: Negative.    All other systems reviewed and are negative.    Objective:   Vitals:   10/30/22 1039 10/30/22 1057  BP: 138/86   Pulse: (!) 111 (!) 101  Resp: 16   Temp: 98 F (36.7 C)   TempSrc: Oral   SpO2: 96%   Weight: 172 lb 3.2 oz (78.1 kg)   Height: 5\' 7"  (1.702 m)     Body mass index is 26.97 kg/m.  Physical Exam Vitals  and nursing note reviewed.  Constitutional:      General: He is not in acute distress.    Appearance: Normal appearance. He is well-developed. He is not ill-appearing, toxic-appearing or diaphoretic.  HENT:     Head: Normocephalic and atraumatic.     Nose: Nose normal.  Eyes:     General: No scleral icterus.       Right eye: No discharge.        Left eye: No discharge.     Conjunctiva/sclera: Conjunctivae normal.  Neck:     Trachea: No tracheal deviation.  Cardiovascular:     Rate and Rhythm: Regular rhythm. Tachycardia present.     Pulses: Normal pulses.     Heart sounds: Normal heart sounds. No murmur heard.    No friction rub. No gallop.  Pulmonary:     Effort: Pulmonary effort is normal. No respiratory distress.     Breath sounds: Normal breath sounds. No stridor. No wheezing, rhonchi or rales.  Abdominal:     General: Bowel sounds are normal.     Palpations: Abdomen is soft.  Skin:    General: Skin is warm and dry.     Coloration: Skin is not jaundiced.     Findings: No bruising or rash.  Neurological:     Mental Status: He is alert.     Motor: No abnormal muscle tone.     Coordination: Coordination normal.     Gait: Gait abnormal (walking with cane).     Comments: Mild right hemiparesis- improving compared to last OV Expressive aphasia   Psychiatric:        Mood and Affect: Mood normal.         Assessment & Plan:   1. Type 2 diabetes mellitus without complication, without long-term current  use of insulin (HCC) Labs not due until after 8/15 Was uncontrolled Pt was not taking second dose of metformin We discussed how he can try to take 1000 mg in the morning with food and see if he tolerates it if the second dose of meds in the evening is a problem. Lab Results  Component Value Date   HGBA1C 7.6 (H) 08/08/2022  - COMPLETE METABOLIC PANEL WITH GFR - Hemoglobin A1c  2. Hypertension associated with type 2 diabetes mellitus (HCC) BP slightly elevated today but pt does seem a little frustrated today BP has been stable and well controlled previously on amlodipine  3. Hyperlipidemia associated with type 2 diabetes mellitus (HCC) Well controlled on repatha  4. Myalgia due to statin On repatha  5. 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 Tachycardia Mildly tachy today, he is asx, denies any palpitations, CP Manually checked during OV and HR was 96, which is close to his baseline  Return for labs after 8/15, metformin would be refilled after labs checked  Return for 4 m f/up DM .   Danelle Berry, PA-C 10/30/22 11:10 AM

## 2022-11-01 ENCOUNTER — Encounter: Payer: Self-pay | Admitting: Family Medicine

## 2022-11-13 ENCOUNTER — Ambulatory Visit: Payer: Medicare Other | Attending: Cardiovascular Disease

## 2022-11-13 DIAGNOSIS — I6522 Occlusion and stenosis of left carotid artery: Secondary | ICD-10-CM | POA: Diagnosis not present

## 2022-11-13 DIAGNOSIS — I6529 Occlusion and stenosis of unspecified carotid artery: Secondary | ICD-10-CM

## 2022-11-19 ENCOUNTER — Telehealth: Payer: Self-pay | Admitting: Emergency Medicine

## 2022-11-19 NOTE — Telephone Encounter (Signed)
Called patient and notified him of the following from Dr. Mariah Milling.  Carotid ultrasound  60 to 79% stenosis on left with string sign  Very mild on right   Would recommend high resolution neck CT with contrast to look at carotid artery stenosis  We can also place referral to vascular surgery either in [place] or [facility]   Patient verbalized understanding. Ordered placed for CT. Order placed for vascular referral with Dr. Wyn Quaker per patient's preference.

## 2022-12-10 ENCOUNTER — Ambulatory Visit
Admission: RE | Admit: 2022-12-10 | Discharge: 2022-12-10 | Disposition: A | Discharge status: Home / Self Care | Payer: Medicare Other | Source: Ambulatory Visit | Attending: Cardiovascular Disease | Admitting: Cardiovascular Disease

## 2022-12-10 DIAGNOSIS — I6529 Occlusion and stenosis of unspecified carotid artery: Secondary | ICD-10-CM | POA: Diagnosis present

## 2022-12-10 MED ORDER — IOHEXOL 350 MG/ML SOLN
75.0000 mL | Freq: Once | INTRAVENOUS | Status: AC | PRN
  Administered 2022-12-10: 75 mL via INTRAVENOUS

## 2022-12-14 ENCOUNTER — Ambulatory Visit (INDEPENDENT_AMBULATORY_CARE_PROVIDER_SITE_OTHER): Payer: Medicare Other | Admitting: Nurse Practitioner

## 2022-12-14 VITALS — BP 118/80 | HR 113 | Resp 19 | Ht 68.0 in | Wt 170.6 lb

## 2022-12-14 DIAGNOSIS — I152 Hypertension secondary to endocrine disorders: Secondary | ICD-10-CM

## 2022-12-14 DIAGNOSIS — E1159 Type 2 diabetes mellitus with other circulatory complications: Secondary | ICD-10-CM

## 2022-12-14 DIAGNOSIS — I6529 Occlusion and stenosis of unspecified carotid artery: Secondary | ICD-10-CM | POA: Diagnosis not present

## 2022-12-14 DIAGNOSIS — E119 Type 2 diabetes mellitus without complications: Secondary | ICD-10-CM

## 2022-12-17 ENCOUNTER — Encounter (INDEPENDENT_AMBULATORY_CARE_PROVIDER_SITE_OTHER): Payer: Self-pay | Admitting: Nurse Practitioner

## 2022-12-17 NOTE — H&P (View-Only) (Signed)
Subjective:    Patient ID: Gerald Bauer, male    DOB: 27-Feb-1946, 77 y.o.   MRN: [id number] Chief Complaint  Patient presents with   Establish Care    Gerald Bauer is a pleasant 77 year old male who presents today for evaluation of carotid stenosis.  The patient had a CVA in July 2023.  He continues to have right-sided weakness with some moderate aphasia.  At that time studies indicate that he had a 100% stenosis of the left carotid.  I recent follow-up with his cardiologist a repeat carotid duplex was performed which showed a 1 to 39% stenosis right ICA with a 60 to 79% stenosis left ICA but noted to have a likely string sign.  The patient underwent a CTA which has been independently reviewed and evaluated by myself and Dr. Wyn Quaker.  The formal read is not yet available, but  he is noted to have a string sign stenosis of his left ICA consistent with a carotid duplex.    Review of Systems  Musculoskeletal:  Positive for gait problem.  Neurological:  Positive for facial asymmetry and weakness.  All other systems reviewed and are negative.      Objective:   Physical Exam Vitals reviewed.  HENT:     Head: Normocephalic.  Neck:     Vascular: Carotid bruit present.  Cardiovascular:     Rate and Rhythm: Normal rate.  Pulmonary:     Effort: Pulmonary effort is normal.  Skin:    General: Skin is warm and dry.  Neurological:     Mental Status: He is alert and oriented to person, place, and time.     Motor: Weakness present.     Gait: Gait abnormal.  Psychiatric:        Mood and Affect: Mood normal.        Behavior: Behavior normal.        Thought Content: Thought content normal.        Judgment: Judgment normal.     BP 118/80 (BP Location: Left Arm)   Pulse (!) 113   Resp 19   Ht 5\' 8"  (1.727 m)   Wt 170 lb 9.6 oz (77.4 kg)   BMI 25.94 kg/m   Past Medical History:  Diagnosis Date   Acid reflux    Aortic atherosclerosis (HCC)    a. Noted on CT Abd in 2012.   Arthritis     hands   Carotid atherosclerosis    a. 02/2018 U/S: RICA 1-39%, LICA 40-59%; b. 09/2021 MRA Head/Neck: near complete occlusion L ICA and L MCA. Patent RICA.   Diabetes mellitus without complication (HCC)    Diastolic dysfunction    a. 09/2021 Echo: EF 55-60%, no rwma, GrI DD, nl RV fxn, RVSP 14.55mmHg.   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 09/2021 MRI/MRA Brain: Acute L MCA distribution infarct. Near complete occlusion L ICA and MCA.   Tachycardia    Tinea pedis of both feet 01/10/2017    Social History   Socioeconomic History   Marital status: Widowed    Spouse name: Venita Sheffield   Number of children: 2   Years  of education: some college   Highest education level: 12th grade  Occupational History   Occupation: Retired    Comment: [employer]  Tobacco Use   Smoking status: Former    Current packs/day: 0.00    Average packs/day: 1 pack/day for 40.0 years (40.0 ttl pk-yrs)    Types: Cigarettes    Start date: 03/09/1957    Quit date: 03/09/1997    Years since quitting: 25.7   Smokeless tobacco: Former   Tobacco comments:    smoking cessation materials not required  Vaping Use   Vaping status: Never Used  Substance and Sexual Activity   Alcohol use: No    Alcohol/week: 0.0 standard drinks of alcohol   Drug use: No   Sexual activity: Never  Other Topics Concern   Not on file  Social History Narrative   Pt lives alone   Social Determinants of Health   Financial Resource Strain: Low Risk  (09/14/2021)   Overall Financial Resource Strain (CARDIA)    Difficulty of Paying Living Expenses: Not hard at all  Food Insecurity: No Food Insecurity (09/14/2021)   Hunger Vital Sign    Worried  About Running Out of Food in the Last Year: Never true    Ran Out of Food in the Last Year: Never true  Transportation Needs: No Transportation Needs (09/14/2021)   PRAPARE - Administrator, Civil Service (Medical): No    Lack of Transportation (Non-Medical): No  Physical Activity: Sufficiently Active (09/14/2021)   Exercise Vital Sign    Days of Exercise per Week: 6 days    Minutes of Exercise per Session: 40 min  Stress: No Stress Concern Present (09/14/2021)   [employer] of Occupational Health - Occupational Stress Questionnaire    Feeling of Stress : Not at all  Social Connections: Moderately Integrated (09/14/2021)   Social Connection and Isolation Panel [NHANES]    Frequency of Communication with Friends and Family: Three times a week    Frequency of Social Gatherings with Friends and Family: Three times a week    Attends Religious Services: 1 to 4 times per year    Active Member of Clubs or Organizations: No    Attends Banker Meetings: 1 to 4 times per year    Marital Status: Widowed  Intimate Partner Violence: Not At Risk (09/14/2021)   Humiliation, Afraid, Rape, and Kick questionnaire    Fear of Current or Ex-Partner: No    Emotionally Abused: No    Physically Abused: No    Sexually Abused: No    Past Surgical History:  Procedure Laterality Date   APPENDECTOMY  1973   CHOLECYSTECTOMY  2013   PROSTATECTOMY  2012    Family History  Problem Relation Age of Onset   Dementia Mother    High Cholesterol Mother    Hypertension Mother    Arthritis Mother    Hyperlipidemia Mother    Heart disease Father    Heart attack Father    Arthritis Maternal Grandmother    Cancer Maternal Grandmother        colon   Prostate cancer Brother    Cancer Brother        prostate   Cancer Maternal Uncle        colon   COPD Maternal Uncle    Stroke Paternal Grandmother    Diabetes Paternal Grandmother    Aneurysm Paternal Grandfather     Allergies   Allergen Reactions   Amoxicillin Swelling and Other (See  Comments)   Statins Other (See Comments)    Affects the liver   Welchol [Colesevelam Hcl] Other (See Comments)    affects the liver       Latest Ref Rng & Units 08/08/2022   10:59 AM 02/07/2022    2:19 PM 10/10/2021    6:12 AM  CBC  WBC 3.8 - 10.8 Thousand/uL 6.6  7.4  7.2   Hemoglobin 13.2 - 17.1 g/dL 16.1  09.6  04.5   Hematocrit 38.5 - 50.0 % 42.1  43.8  43.4   Platelets 140 - 400 Thousand/uL 252  254  212       CMP     Component Value Date/Time   NA 140 08/08/2022 1059   NA 141 01/20/2019 0930   K 4.1 08/08/2022 1059   CL 102 08/08/2022 1059   CO2 28 08/08/2022 1059   GLUCOSE 163 (H) 08/08/2022 1059   BUN 12 08/08/2022 1059   BUN 11 01/20/2019 0930   CREATININE 0.80 08/08/2022 1059   CALCIUM 9.5 08/08/2022 1059   PROT 6.8 08/08/2022 1059   PROT 6.9 08/10/2019 0826   ALBUMIN 3.3 (L) 10/10/2021 0612   ALBUMIN 4.3 08/10/2019 0826   AST 11 08/08/2022 1059   ALT 12 08/08/2022 1059   ALKPHOS 50 10/10/2021 0612   BILITOT 0.5 08/08/2022 1059   BILITOT 0.6 08/10/2019 0826   EGFR 91 08/08/2022 1059   GFRNONAA >60 10/30/2021 0919   GFRNONAA 85 04/25/2020 1557     No results found.     Assessment & Plan:   1. Carotid atherosclerosis, unspecified laterality  I had a discussion with the patient and his daughter-in-law to discuss his results.  He is agreeable to undergo the procedure but sometime next year.  We had a discussion and given that he currently has a string sign stenosis of his ICA there is a considerable chance that it may be completely occluded if we do not intervene before January.  I discussed that once there is a 100% occlusion we are unable to provide any intervention to restore flow.  The patient will like to return home to discuss it with his family and they will contact us if they wish to move forward.  I have also asked that if they choose not to move forward, that he schedule an ultrasound for  early next year prior to his intervention to determine if the carotid artery still patent.  Addendum: The patient has contacted our office and wishes to proceed with carotid stent placement   Recommend:  The patient is symptomatic with respect to the carotid stenosis.  The patient now has progressed and has a lesion the is >70%.  I have completed Shared Decision-Making with Ammon Forestier Kochprior to carotid surgery/intervention. The conversation included: -Discussion of all treatment options including carotid endarterectomy (CEA), carotid artery stenting (which includes transcarotid artery revascularization (TCAR)), and optimal medical therapy (OMT). -Explanation of risks and benefits for each option specific to Estrella Deeds Shoun's clinical situation. -Integration of clinical guidelines as it relates to the patient's history and comorbidities. -Discussion and incorporation of Jobani Arabie and their personal preferences and priorities in choosing a treatment plan plan  If the patient was unable to participate in Shared Decision Making this process was done with na.  Patient's CT angiography of the carotid arteries confirms >70% left ICA stenosis.  The anatomical considerations support stenting over surgery.  This was discussed in detail with the patient.  The risks, benefits and alternative therapies  were reviewed in detail with the patient.  All questions were answered.  The patient agrees to proceed with stenting of the left carotid artery.  The patient's NIHSS score is as follows: 4 Mild: 1 - 5 Mild to Moderately Severe: 5 - 14 Severe: 15 - 24 Very Severe: >25  Continue antiplatelet therapy as prescribed. Continue management of CAD, HTN and Hyperlipidemia. Healthy heart diet, encouraged exercise at least 4 times per week.   2. Hypertension associated with type 2 diabetes mellitus (HCC) Continue antihypertensive medications as already ordered, these medications have been reviewed  and there are no changes at this time.  3. Type 2 diabetes mellitus without complication, without long-term current use of insulin (HCC) Continue hypoglycemic medications as already ordered, these medications have been reviewed and there are no changes at this time.  Hgb A1C to be monitored as already arranged by primary service    Current Outpatient Medications on File Prior to Visit  Medication Sig Dispense Refill   acetaminophen (TYLENOL) 325 MG tablet Take 2 tablets (650 mg total) by mouth every 4 (four) hours as needed for mild pain (or temp > 37.5 C (99.5 F)). (Patient taking differently: Take 650 mg by mouth as needed for mild pain (or temp > 37.5 C (99.5 F)).)     amLODipine (NORVASC) 5 MG tablet Take 1 tablet (5 mg total) by mouth daily. 90 tablet 3   aspirin EC 81 MG tablet Take 81 mg by mouth daily. Swallow whole.     Cholecalciferol (VITAMIN D3) 25 MCG (1000 UT) CAPS Take 1,000 Units by mouth daily.     clopidogrel (PLAVIX) 75 MG tablet Take 1 tablet (75 mg total) by mouth daily. 90 tablet 3   Cyanocobalamin (VITAMIN B12) 500 MCG TABS Take 500 mcg by mouth daily.     Evolocumab (REPATHA SURECLICK) 140 MG/ML SOAJ Inject 140 mg Subcutaneously every 2 weeks. 2 mL 6   Lancets (ONETOUCH DELICA PLUS LANCET33G) MISC USE WITH METER TO CHECK BLOOD  GLUCOSE ONCE DAILY AS DIRECTED 100 each 3   metFORMIN (GLUCOPHAGE-XR) 500 MG 24 hr tablet TAKE 1 TABLET BY MOUTH TWICE  DAILY WITH MEALS 180 tablet 0   ONETOUCH VERIO test strip CHECK FINGERSTICK BLOOD SUGARS  ONCE DAILY 100 strip 3   No current facility-administered medications on file prior to visit.    There are no Patient Instructions on file for this visit. No follow-ups on file.   Georgiana Spinner, NP

## 2022-12-17 NOTE — Progress Notes (Addendum)
Subjective:    Patient ID: Gerald Bauer, male    DOB: 27-Feb-1946, 77 y.o.   MRN: [id number] Chief Complaint  Patient presents with   Establish Care    Gerald Bauer is a pleasant 77 year old male who presents today for evaluation of carotid stenosis.  The patient had a CVA in July 2023.  He continues to have right-sided weakness with some moderate aphasia.  At that time studies indicate that he had a 100% stenosis of the left carotid.  I recent follow-up with his cardiologist a repeat carotid duplex was performed which showed a 1 to 39% stenosis right ICA with a 60 to 79% stenosis left ICA but noted to have a likely string sign.  The patient underwent a CTA which has been independently reviewed and evaluated by myself and Dr. Wyn Quaker.  The formal read is not yet available, but  he is noted to have a string sign stenosis of his left ICA consistent with a carotid duplex.    Review of Systems  Musculoskeletal:  Positive for gait problem.  Neurological:  Positive for facial asymmetry and weakness.  All other systems reviewed and are negative.      Objective:   Physical Exam Vitals reviewed.  HENT:     Head: Normocephalic.  Neck:     Vascular: Carotid bruit present.  Cardiovascular:     Rate and Rhythm: Normal rate.  Pulmonary:     Effort: Pulmonary effort is normal.  Skin:    General: Skin is warm and dry.  Neurological:     Mental Status: He is alert and oriented to person, place, and time.     Motor: Weakness present.     Gait: Gait abnormal.  Psychiatric:        Mood and Affect: Mood normal.        Behavior: Behavior normal.        Thought Content: Thought content normal.        Judgment: Judgment normal.     BP 118/80 (BP Location: Left Arm)   Pulse (!) 113   Resp 19   Ht 5\' 8"  (1.727 m)   Wt 170 lb 9.6 oz (77.4 kg)   BMI 25.94 kg/m   Past Medical History:  Diagnosis Date   Acid reflux    Aortic atherosclerosis (HCC)    a. Noted on CT Abd in 2012.   Arthritis     hands   Carotid atherosclerosis    a. 02/2018 U/S: RICA 1-39%, LICA 40-59%; b. 09/2021 MRA Head/Neck: near complete occlusion L ICA and L MCA. Patent RICA.   Diabetes mellitus without complication (HCC)    Diastolic dysfunction    a. 09/2021 Echo: EF 55-60%, no rwma, GrI DD, nl RV fxn, RVSP 14.55mmHg.   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 09/2021 MRI/MRA Brain: Acute L MCA distribution infarct. Near complete occlusion L ICA and MCA.   Tachycardia    Tinea pedis of both feet 01/10/2017    Social History   Socioeconomic History   Marital status: Widowed    Spouse name: Venita Sheffield   Number of children: 2   Years  of education: some college   Highest education level: 12th grade  Occupational History   Occupation: Retired    Comment: [employer]  Tobacco Use   Smoking status: Former    Current packs/day: 0.00    Average packs/day: 1 pack/day for 40.0 years (40.0 ttl pk-yrs)    Types: Cigarettes    Start date: 03/09/1957    Quit date: 03/09/1997    Years since quitting: 25.7   Smokeless tobacco: Former   Tobacco comments:    smoking cessation materials not required  Vaping Use   Vaping status: Never Used  Substance and Sexual Activity   Alcohol use: No    Alcohol/week: 0.0 standard drinks of alcohol   Drug use: No   Sexual activity: Never  Other Topics Concern   Not on file  Social History Narrative   Pt lives alone   Social Determinants of Health   Financial Resource Strain: Low Risk  (09/14/2021)   Overall Financial Resource Strain (CARDIA)    Difficulty of Paying Living Expenses: Not hard at all  Food Insecurity: No Food Insecurity (09/14/2021)   Hunger Vital Sign    Worried  About Running Out of Food in the Last Year: Never true    Ran Out of Food in the Last Year: Never true  Transportation Needs: No Transportation Needs (09/14/2021)   PRAPARE - Administrator, Civil Service (Medical): No    Lack of Transportation (Non-Medical): No  Physical Activity: Sufficiently Active (09/14/2021)   Exercise Vital Sign    Days of Exercise per Week: 6 days    Minutes of Exercise per Session: 40 min  Stress: No Stress Concern Present (09/14/2021)   [employer] of Occupational Health - Occupational Stress Questionnaire    Feeling of Stress : Not at all  Social Connections: Moderately Integrated (09/14/2021)   Social Connection and Isolation Panel [NHANES]    Frequency of Communication with Friends and Family: Three times a week    Frequency of Social Gatherings with Friends and Family: Three times a week    Attends Religious Services: 1 to 4 times per year    Active Member of Clubs or Organizations: No    Attends Banker Meetings: 1 to 4 times per year    Marital Status: Widowed  Intimate Partner Violence: Not At Risk (09/14/2021)   Humiliation, Afraid, Rape, and Kick questionnaire    Fear of Current or Ex-Partner: No    Emotionally Abused: No    Physically Abused: No    Sexually Abused: No    Past Surgical History:  Procedure Laterality Date   APPENDECTOMY  1973   CHOLECYSTECTOMY  2013   PROSTATECTOMY  2012    Family History  Problem Relation Age of Onset   Dementia Mother    High Cholesterol Mother    Hypertension Mother    Arthritis Mother    Hyperlipidemia Mother    Heart disease Father    Heart attack Father    Arthritis Maternal Grandmother    Cancer Maternal Grandmother        colon   Prostate cancer Brother    Cancer Brother        prostate   Cancer Maternal Uncle        colon   COPD Maternal Uncle    Stroke Paternal Grandmother    Diabetes Paternal Grandmother    Aneurysm Paternal Grandfather     Allergies   Allergen Reactions   Amoxicillin Swelling and Other (See  Comments)   Statins Other (See Comments)    Affects the liver   Welchol [Colesevelam Hcl] Other (See Comments)    affects the liver       Latest Ref Rng & Units 08/08/2022   10:59 AM 02/07/2022    2:19 PM 10/10/2021    6:12 AM  CBC  WBC 3.8 - 10.8 Thousand/uL 6.6  7.4  7.2   Hemoglobin 13.2 - 17.1 g/dL 16.1  09.6  04.5   Hematocrit 38.5 - 50.0 % 42.1  43.8  43.4   Platelets 140 - 400 Thousand/uL 252  254  212       CMP     Component Value Date/Time   NA 140 08/08/2022 1059   NA 141 01/20/2019 0930   K 4.1 08/08/2022 1059   CL 102 08/08/2022 1059   CO2 28 08/08/2022 1059   GLUCOSE 163 (H) 08/08/2022 1059   BUN 12 08/08/2022 1059   BUN 11 01/20/2019 0930   CREATININE 0.80 08/08/2022 1059   CALCIUM 9.5 08/08/2022 1059   PROT 6.8 08/08/2022 1059   PROT 6.9 08/10/2019 0826   ALBUMIN 3.3 (L) 10/10/2021 0612   ALBUMIN 4.3 08/10/2019 0826   AST 11 08/08/2022 1059   ALT 12 08/08/2022 1059   ALKPHOS 50 10/10/2021 0612   BILITOT 0.5 08/08/2022 1059   BILITOT 0.6 08/10/2019 0826   EGFR 91 08/08/2022 1059   GFRNONAA >60 10/30/2021 0919   GFRNONAA 85 04/25/2020 1557     No results found.     Assessment & Plan:   1. Carotid atherosclerosis, unspecified laterality  I had a discussion with the patient and his daughter-in-law to discuss his results.  He is agreeable to undergo the procedure but sometime next year.  We had a discussion and given that he currently has a string sign stenosis of his ICA there is a considerable chance that it may be completely occluded if we do not intervene before January.  I discussed that once there is a 100% occlusion we are unable to provide any intervention to restore flow.  The patient will like to return home to discuss it with his family and they will contact us if they wish to move forward.  I have also asked that if they choose not to move forward, that he schedule an ultrasound for  early next year prior to his intervention to determine if the carotid artery still patent.  Addendum: The patient has contacted our office and wishes to proceed with carotid stent placement   Recommend:  The patient is symptomatic with respect to the carotid stenosis.  The patient now has progressed and has a lesion the is >70%.  I have completed Shared Decision-Making with Ammon Forestier Kochprior to carotid surgery/intervention. The conversation included: -Discussion of all treatment options including carotid endarterectomy (CEA), carotid artery stenting (which includes transcarotid artery revascularization (TCAR)), and optimal medical therapy (OMT). -Explanation of risks and benefits for each option specific to Estrella Deeds Shoun's clinical situation. -Integration of clinical guidelines as it relates to the patient's history and comorbidities. -Discussion and incorporation of Jobani Arabie and their personal preferences and priorities in choosing a treatment plan plan  If the patient was unable to participate in Shared Decision Making this process was done with na.  Patient's CT angiography of the carotid arteries confirms >70% left ICA stenosis.  The anatomical considerations support stenting over surgery.  This was discussed in detail with the patient.  The risks, benefits and alternative therapies  were reviewed in detail with the patient.  All questions were answered.  The patient agrees to proceed with stenting of the left carotid artery.  The patient's NIHSS score is as follows: 4 Mild: 1 - 5 Mild to Moderately Severe: 5 - 14 Severe: 15 - 24 Very Severe: >25  Continue antiplatelet therapy as prescribed. Continue management of CAD, HTN and Hyperlipidemia. Healthy heart diet, encouraged exercise at least 4 times per week.   2. Hypertension associated with type 2 diabetes mellitus (HCC) Continue antihypertensive medications as already ordered, these medications have been reviewed  and there are no changes at this time.  3. Type 2 diabetes mellitus without complication, without long-term current use of insulin (HCC) Continue hypoglycemic medications as already ordered, these medications have been reviewed and there are no changes at this time.  Hgb A1C to be monitored as already arranged by primary service    Current Outpatient Medications on File Prior to Visit  Medication Sig Dispense Refill   acetaminophen (TYLENOL) 325 MG tablet Take 2 tablets (650 mg total) by mouth every 4 (four) hours as needed for mild pain (or temp > 37.5 C (99.5 F)). (Patient taking differently: Take 650 mg by mouth as needed for mild pain (or temp > 37.5 C (99.5 F)).)     amLODipine (NORVASC) 5 MG tablet Take 1 tablet (5 mg total) by mouth daily. 90 tablet 3   aspirin EC 81 MG tablet Take 81 mg by mouth daily. Swallow whole.     Cholecalciferol (VITAMIN D3) 25 MCG (1000 UT) CAPS Take 1,000 Units by mouth daily.     clopidogrel (PLAVIX) 75 MG tablet Take 1 tablet (75 mg total) by mouth daily. 90 tablet 3   Cyanocobalamin (VITAMIN B12) 500 MCG TABS Take 500 mcg by mouth daily.     Evolocumab (REPATHA SURECLICK) 140 MG/ML SOAJ Inject 140 mg Subcutaneously every 2 weeks. 2 mL 6   Lancets (ONETOUCH DELICA PLUS LANCET33G) MISC USE WITH METER TO CHECK BLOOD  GLUCOSE ONCE DAILY AS DIRECTED 100 each 3   metFORMIN (GLUCOPHAGE-XR) 500 MG 24 hr tablet TAKE 1 TABLET BY MOUTH TWICE  DAILY WITH MEALS 180 tablet 0   ONETOUCH VERIO test strip CHECK FINGERSTICK BLOOD SUGARS  ONCE DAILY 100 strip 3   No current facility-administered medications on file prior to visit.    There are no Patient Instructions on file for this visit. No follow-ups on file.   Georgiana Spinner, NP

## 2022-12-21 ENCOUNTER — Other Ambulatory Visit: Payer: Self-pay | Admitting: Family Medicine

## 2022-12-24 ENCOUNTER — Telehealth (INDEPENDENT_AMBULATORY_CARE_PROVIDER_SITE_OTHER): Payer: Self-pay

## 2022-12-24 NOTE — Telephone Encounter (Signed)
Requested Prescriptions  Pending Prescriptions Disp Refills   metFORMIN (GLUCOPHAGE-XR) 500 MG 24 hr tablet [Pharmacy Med Name: metFORMIN HCl ER 500 MG Oral Tablet Extended Release 24 Hour] 180 tablet 0    Sig: TAKE 1 TABLET BY MOUTH TWICE  DAILY WITH MEALS     Endocrinology:  Diabetes - Biguanides Failed - 12/21/2022 10:46 PM      Failed - B12 Level in normal range and within 720 days    No results found for: "VITAMINB12"       Passed - Cr in normal range and within 360 days    Creat  Date Value Ref Range Status  08/08/2022 0.80 0.70 - 1.28 mg/dL Final   Creatinine, Urine  Date Value Ref Range Status  08/08/2022 183 20 - 320 mg/dL Final         Passed - HBA1C is between 0 and 7.9 and within 180 days    Hemoglobin A1C  Date Value Ref Range Status  05/07/2022 6.4  Final    Comment:    House call report   Hgb A1c MFr Bld  Date Value Ref Range Status  08/08/2022 7.6 (H) <5.7 % of total Hgb Final    Comment:    For someone without known diabetes, a hemoglobin A1c value of 6.5% or greater indicates that they may have  diabetes and this should be confirmed with a follow-up  test. . For someone with known diabetes, a value <7% indicates  that their diabetes is well controlled and a value  greater than or equal to 7% indicates suboptimal  control. A1c targets should be individualized based on  duration of diabetes, age, comorbid conditions, and  other considerations. . Currently, no consensus exists regarding use of hemoglobin A1c for diagnosis of diabetes for children. .          Passed - eGFR in normal range and within 360 days    GFR, Est African American  Date Value Ref Range Status  04/25/2020 99 > OR = 60 mL/min/1.52m2 Final   GFR, Est Non African American  Date Value Ref Range Status  04/25/2020 85 > OR = 60 mL/min/1.43m2 Final   GFR, Estimated  Date Value Ref Range Status  10/30/2021 >60 >60 mL/min Final    Comment:    (NOTE) Calculated using the CKD-EPI  Creatinine Equation (2021) Performed at [facility], [address] [place], [place] [address]    eGFR  Date Value Ref Range Status  08/08/2022 91 > OR = 60 mL/min/1.39m2 Final         Passed - Valid encounter within last 6 months    Recent Outpatient Visits           1 month ago Type 2 diabetes mellitus without complication, without long-term current use of insulin [facility])   [place] [facility] Danelle Berry, PA-C   4 months ago Type 2 diabetes mellitus without complication, without long-term current use of insulin (HCC)   [place] [facility] Mecum, Erin E, PA-C   10 months ago Hyperlipidemia associated with type 2 diabetes mellitus [facility])   [place] [facility] Danelle Berry, PA-C   1 year ago Encounter for examination following treatment at hospital   [facility] Danelle Berry, PA-C   1 year ago Skin cancer screening   [facility] Mecum, Oswaldo [place], [place]       Future Appointments  In 2 months Danelle Berry, PA-C [facility]            Passed - CBC within normal limits and completed in the last 12 months    WBC  Date Value Ref Range Status  08/08/2022 6.6 3.8 - 10.8 Thousand/uL Final   RBC  Date Value Ref Range Status  08/08/2022 4.70 4.20 - 5.80 Million/uL Final   Hemoglobin  Date Value Ref Range Status  08/08/2022 14.6 13.2 - 17.1 g/dL Final  81/19/1478 29.5 13.0 - 17.7 g/dL Final   HCT  Date Value Ref Range Status  08/08/2022 42.1 38.5 - 50.0 % Final   Hematocrit  Date Value Ref Range Status  01/20/2019 43.5 37.5 - 51.0 % Final   MCHC  Date Value Ref Range Status  08/08/2022 34.7 32.0 - 36.0 g/dL Final   [facility]  Date Value Ref Range Status  08/08/2022 31.1 27.0 - 33.0 pg Final   MCV  Date Value Ref Range Status  08/08/2022 89.6 80.0 - 100.0 fL Final  01/20/2019 88 79 - 97 fL Final   No results  found for: "PLTCOUNTKUC", "LABPLAT", "POCPLA" RDW  Date Value Ref Range Status  08/08/2022 11.9 11.0 - 15.0 % Final  01/20/2019 11.8 11.6 - 15.4 % Final

## 2022-12-24 NOTE — Telephone Encounter (Signed)
Spoke with the patient's daughter in law and the patient was scheduled for 12/26/22 with a 9:30 am arrival time to the [facility] for a left carotid stent placement. Patient's daughter in law called back to request a different date as they were taking the grandchild to see Maisie Fus the train. Patient has been rescheduled to 12/31/22 with a 8:30 am arrival time to the [facility]. Pre-procedure instructions will be sent to Mychart and mailed.

## 2022-12-31 ENCOUNTER — Other Ambulatory Visit: Payer: Self-pay

## 2022-12-31 ENCOUNTER — Inpatient Hospital Stay
Admission: RE | Admit: 2022-12-31 | Discharge: 2023-01-01 | DRG: 026 | Disposition: A | Discharge status: Home / Self Care | Payer: Medicare Other | Attending: Vascular Surgery | Admitting: Vascular Surgery

## 2022-12-31 ENCOUNTER — Encounter: Payer: Self-pay | Admitting: Vascular Surgery

## 2022-12-31 ENCOUNTER — Encounter
Admission: RE | Disposition: A | Discharge status: Home / Self Care | Payer: Self-pay | Source: Home / Self Care | Attending: Vascular Surgery

## 2022-12-31 DIAGNOSIS — Z85828 Personal history of other malignant neoplasm of skin: Secondary | ICD-10-CM | POA: Diagnosis not present

## 2022-12-31 DIAGNOSIS — I69351 Hemiplegia and hemiparesis following cerebral infarction affecting right dominant side: Secondary | ICD-10-CM | POA: Diagnosis not present

## 2022-12-31 DIAGNOSIS — I771 Stricture of artery: Secondary | ICD-10-CM

## 2022-12-31 DIAGNOSIS — Z8249 Family history of ischemic heart disease and other diseases of the circulatory system: Secondary | ICD-10-CM

## 2022-12-31 DIAGNOSIS — Z833 Family history of diabetes mellitus: Secondary | ICD-10-CM

## 2022-12-31 DIAGNOSIS — Z820 Family history of epilepsy and other diseases of the nervous system: Secondary | ICD-10-CM

## 2022-12-31 DIAGNOSIS — Z79899 Other long term (current) drug therapy: Secondary | ICD-10-CM

## 2022-12-31 DIAGNOSIS — Z8546 Personal history of malignant neoplasm of prostate: Secondary | ICD-10-CM | POA: Diagnosis not present

## 2022-12-31 DIAGNOSIS — I700 Atherosclerosis of aorta: Secondary | ICD-10-CM | POA: Diagnosis present

## 2022-12-31 DIAGNOSIS — Z7982 Long term (current) use of aspirin: Secondary | ICD-10-CM | POA: Diagnosis not present

## 2022-12-31 DIAGNOSIS — E785 Hyperlipidemia, unspecified: Secondary | ICD-10-CM | POA: Diagnosis present

## 2022-12-31 DIAGNOSIS — Z87891 Personal history of nicotine dependence: Secondary | ICD-10-CM | POA: Diagnosis not present

## 2022-12-31 DIAGNOSIS — Z7984 Long term (current) use of oral hypoglycemic drugs: Secondary | ICD-10-CM

## 2022-12-31 DIAGNOSIS — Z8261 Family history of arthritis: Secondary | ICD-10-CM | POA: Diagnosis not present

## 2022-12-31 DIAGNOSIS — I69320 Aphasia following cerebral infarction: Secondary | ICD-10-CM | POA: Diagnosis not present

## 2022-12-31 DIAGNOSIS — Z83438 Family history of other disorder of lipoprotein metabolism and other lipidemia: Secondary | ICD-10-CM | POA: Diagnosis not present

## 2022-12-31 DIAGNOSIS — Z888 Allergy status to other drugs, medicaments and biological substances status: Secondary | ICD-10-CM

## 2022-12-31 DIAGNOSIS — Z825 Family history of asthma and other chronic lower respiratory diseases: Secondary | ICD-10-CM

## 2022-12-31 DIAGNOSIS — Z823 Family history of stroke: Secondary | ICD-10-CM

## 2022-12-31 DIAGNOSIS — E119 Type 2 diabetes mellitus without complications: Secondary | ICD-10-CM | POA: Diagnosis present

## 2022-12-31 DIAGNOSIS — Z880 Allergy status to penicillin: Secondary | ICD-10-CM

## 2022-12-31 DIAGNOSIS — K219 Gastro-esophageal reflux disease without esophagitis: Secondary | ICD-10-CM | POA: Diagnosis present

## 2022-12-31 DIAGNOSIS — I6522 Occlusion and stenosis of left carotid artery: Secondary | ICD-10-CM | POA: Diagnosis present

## 2022-12-31 DIAGNOSIS — Z23 Encounter for immunization: Secondary | ICD-10-CM | POA: Diagnosis present

## 2022-12-31 DIAGNOSIS — Z7902 Long term (current) use of antithrombotics/antiplatelets: Secondary | ICD-10-CM

## 2022-12-31 DIAGNOSIS — Z8042 Family history of malignant neoplasm of prostate: Secondary | ICD-10-CM

## 2022-12-31 DIAGNOSIS — I152 Hypertension secondary to endocrine disorders: Secondary | ICD-10-CM | POA: Diagnosis present

## 2022-12-31 DIAGNOSIS — Z800 Family history of malignant neoplasm of digestive organs: Secondary | ICD-10-CM

## 2022-12-31 DIAGNOSIS — E1169 Type 2 diabetes mellitus with other specified complication: Secondary | ICD-10-CM | POA: Diagnosis present

## 2022-12-31 DIAGNOSIS — I2510 Atherosclerotic heart disease of native coronary artery without angina pectoris: Secondary | ICD-10-CM | POA: Diagnosis present

## 2022-12-31 HISTORY — PX: CAROTID PTA/STENT INTERVENTION: CATH118231

## 2022-12-31 LAB — BUN: BUN: 15 mg/dL (ref 8–23)

## 2022-12-31 LAB — MRSA NEXT GEN BY PCR, NASAL: MRSA by PCR Next Gen: NOT DETECTED

## 2022-12-31 LAB — POCT ACTIVATED CLOTTING TIME: Activated Clotting Time: 299 s

## 2022-12-31 LAB — GLUCOSE, CAPILLARY
Glucose-Capillary: 192 mg/dL — ABNORMAL HIGH (ref 70–99)
Glucose-Capillary: 216 mg/dL — ABNORMAL HIGH (ref 70–99)

## 2022-12-31 LAB — CREATININE, SERUM
Creatinine, Ser: 0.85 mg/dL (ref 0.61–1.24)
GFR, Estimated: >60 mL/min (ref >60)

## 2022-12-31 SURGERY — CAROTID PTA/STENT INTERVENTION
Anesthesia: Moderate Sedation | Laterality: Left

## 2022-12-31 MED ORDER — VANCOMYCIN HCL IN DEXTROSE 1-5 GM/200ML-% IV SOLN
1000.0000 mg | Freq: Once | INTRAVENOUS | Status: AC
  Administered 2022-12-31: 1000 mg via INTRAVENOUS

## 2022-12-31 MED ORDER — MAGNESIUM SULFATE 2 GM/50ML IV SOLN: 2.0000 g | Freq: Every day | INTRAVENOUS | Status: DC | PRN

## 2022-12-31 MED ORDER — MIDAZOLAM HCL 2 MG/2ML IJ SOLN
INTRAMUSCULAR | Status: DC | PRN
  Administered 2022-12-31 (×2): 1 mg via INTRAVENOUS

## 2022-12-31 MED ORDER — FENTANYL CITRATE (PF) 100 MCG/2ML IJ SOLN
INTRAMUSCULAR | Status: AC
  Filled 2022-12-31: qty 2

## 2022-12-31 MED ORDER — MIDAZOLAM HCL 5 MG/5ML IJ SOLN
INTRAMUSCULAR | Status: AC
  Filled 2022-12-31: qty 5

## 2022-12-31 MED ORDER — CEFAZOLIN SODIUM-DEXTROSE 2-4 GM/100ML-% IV SOLN: 2.0000 g | INTRAVENOUS | Status: DC

## 2022-12-31 MED ORDER — CHLORHEXIDINE GLUCONATE CLOTH 2 % EX PADS
6.0000 | MEDICATED_PAD | Freq: Every day | CUTANEOUS | Status: DC
  Administered 2022-12-31 – 2023-01-01 (×2): 6 via TOPICAL

## 2022-12-31 MED ORDER — CEFAZOLIN SODIUM-DEXTROSE 2-4 GM/100ML-% IV SOLN
INTRAVENOUS | Status: AC
  Filled 2022-12-31: qty 100

## 2022-12-31 MED ORDER — OXYCODONE-ACETAMINOPHEN 5-325 MG PO TABS
1.0000 | ORAL_TABLET | ORAL | Status: DC | PRN
  Administered 2023-01-01: 1 via ORAL
  Filled 2022-12-31: qty 1

## 2022-12-31 MED ORDER — HEPARIN SODIUM (PORCINE) 1000 UNIT/ML IJ SOLN
INTRAMUSCULAR | Status: DC | PRN
  Administered 2022-12-31: 8000 [IU] via INTRAVENOUS

## 2022-12-31 MED ORDER — LABETALOL HCL 5 MG/ML IV SOLN: 10.0000 mg | INTRAVENOUS | Status: DC | PRN

## 2022-12-31 MED ORDER — SODIUM CHLORIDE 0.9 % IV SOLN: 500.0000 mL | Freq: Once | INTRAVENOUS | Status: DC | PRN

## 2022-12-31 MED ORDER — VITAMIN D 25 MCG (1000 UNIT) PO TABS
1000.0000 [IU] | ORAL_TABLET | Freq: Every day | ORAL | Status: DC
  Administered 2023-01-01: 1000 [IU] via ORAL
  Filled 2022-12-31: qty 1

## 2022-12-31 MED ORDER — FENTANYL CITRATE (PF) 100 MCG/2ML IJ SOLN
INTRAMUSCULAR | Status: DC | PRN
  Administered 2022-12-31: 25 ug via INTRAVENOUS
  Administered 2022-12-31: 50 ug via INTRAVENOUS

## 2022-12-31 MED ORDER — ASPIRIN 81 MG PO TBEC
81.0000 mg | DELAYED_RELEASE_TABLET | Freq: Every day | ORAL | Status: DC
  Administered 2023-01-01: 81 mg via ORAL
  Filled 2022-12-31: qty 1

## 2022-12-31 MED ORDER — SODIUM CHLORIDE 0.9 % IV SOLN: INTRAVENOUS | Status: DC

## 2022-12-31 MED ORDER — PHENOL 1.4 % MT LIQD: 1.0000 | OROMUCOSAL | Status: DC | PRN

## 2022-12-31 MED ORDER — MIDAZOLAM HCL 2 MG/ML PO SYRP: 8.0000 mg | ORAL_SOLUTION | Freq: Once | ORAL | Status: DC | PRN

## 2022-12-31 MED ORDER — IODIXANOL 320 MG/ML IV SOLN
INTRAVENOUS | Status: DC | PRN
  Administered 2022-12-31: 65 mL via INTRA_ARTERIAL

## 2022-12-31 MED ORDER — HYDROMORPHONE HCL 1 MG/ML IJ SOLN: 1.0000 mg | Freq: Once | INTRAMUSCULAR | Status: DC | PRN

## 2022-12-31 MED ORDER — ACETAMINOPHEN 325 MG PO TABS
325.0000 mg | ORAL_TABLET | ORAL | Status: DC | PRN
  Administered 2022-12-31: 650 mg via ORAL
  Filled 2022-12-31: qty 2

## 2022-12-31 MED ORDER — SODIUM CHLORIDE 0.9 % IV SOLN: INTRAVENOUS | Status: DC | PRN

## 2022-12-31 MED ORDER — INFLUENZA VAC A&B SURF ANT ADJ 0.5 ML IM SUSY
0.5000 mL | PREFILLED_SYRINGE | INTRAMUSCULAR | Status: AC
  Administered 2023-01-01: 0.5 mL via INTRAMUSCULAR
  Filled 2022-12-31: qty 0.5

## 2022-12-31 MED ORDER — ACETAMINOPHEN 325 MG RE SUPP: 325.0000 mg | RECTAL | Status: DC | PRN

## 2022-12-31 MED ORDER — FAMOTIDINE 20 MG PO TABS: 40.0000 mg | ORAL_TABLET | Freq: Once | ORAL | Status: DC | PRN

## 2022-12-31 MED ORDER — MORPHINE SULFATE (PF) 4 MG/ML IV SOLN: 2.0000 mg | INTRAVENOUS | Status: DC | PRN

## 2022-12-31 MED ORDER — VANCOMYCIN HCL IN DEXTROSE 1-5 GM/200ML-% IV SOLN
1000.0000 mg | Freq: Two times a day (BID) | INTRAVENOUS | Status: AC
  Administered 2022-12-31 – 2023-01-01 (×2): 1000 mg via INTRAVENOUS
  Filled 2022-12-31 (×2): qty 200

## 2022-12-31 MED ORDER — PHENYLEPHRINE 80 MCG/ML (10ML) SYRINGE FOR IV PUSH (FOR BLOOD PRESSURE SUPPORT)
PREFILLED_SYRINGE | INTRAVENOUS | Status: AC
  Filled 2022-12-31: qty 10

## 2022-12-31 MED ORDER — CLOPIDOGREL BISULFATE 75 MG PO TABS
75.0000 mg | ORAL_TABLET | Freq: Every day | ORAL | Status: DC
  Administered 2023-01-01: 75 mg via ORAL
  Filled 2022-12-31: qty 1

## 2022-12-31 MED ORDER — ATROPINE SULFATE 1 MG/10ML IJ SOSY
PREFILLED_SYRINGE | INTRAMUSCULAR | Status: AC
  Filled 2022-12-31: qty 20

## 2022-12-31 MED ORDER — METOPROLOL TARTRATE 5 MG/5ML IV SOLN: 2.0000 mg | INTRAVENOUS | Status: DC | PRN

## 2022-12-31 MED ORDER — ORAL CARE MOUTH RINSE: 15.0000 mL | OROMUCOSAL | Status: DC | PRN

## 2022-12-31 MED ORDER — DOPAMINE-DEXTROSE 3.2-5 MG/ML-% IV SOLN
INTRAVENOUS | Status: AC
  Filled 2022-12-31: qty 250

## 2022-12-31 MED ORDER — GUAIFENESIN-DM 100-10 MG/5ML PO SYRP: 15.0000 mL | ORAL_SOLUTION | ORAL | Status: DC | PRN

## 2022-12-31 MED ORDER — HYDRALAZINE HCL 20 MG/ML IJ SOLN: 5.0000 mg | INTRAMUSCULAR | Status: DC | PRN

## 2022-12-31 MED ORDER — HEPARIN SODIUM (PORCINE) 1000 UNIT/ML IJ SOLN
INTRAMUSCULAR | Status: AC
  Filled 2022-12-31: qty 10

## 2022-12-31 MED ORDER — CYANOCOBALAMIN 500 MCG PO TABS
500.0000 ug | ORAL_TABLET | Freq: Every day | ORAL | Status: DC
  Administered 2023-01-01: 500 ug via ORAL
  Filled 2022-12-31: qty 1

## 2022-12-31 MED ORDER — METHYLPREDNISOLONE SODIUM SUCC 125 MG IJ SOLR: 125.0000 mg | Freq: Once | INTRAMUSCULAR | Status: DC | PRN

## 2022-12-31 MED ORDER — FAMOTIDINE IN NACL 20-0.9 MG/50ML-% IV SOLN
20.0000 mg | Freq: Two times a day (BID) | INTRAVENOUS | Status: DC
  Administered 2022-12-31 – 2023-01-01 (×3): 20 mg via INTRAVENOUS
  Filled 2022-12-31 (×3): qty 50

## 2022-12-31 MED ORDER — POTASSIUM CHLORIDE CRYS ER 20 MEQ PO TBCR
20.0000 meq | EXTENDED_RELEASE_TABLET | Freq: Every day | ORAL | Status: AC | PRN
  Administered 2023-01-01: 20 meq via ORAL
  Filled 2022-12-31: qty 1

## 2022-12-31 MED ORDER — ONDANSETRON HCL 4 MG/2ML IJ SOLN: 4.0000 mg | Freq: Four times a day (QID) | INTRAMUSCULAR | Status: DC | PRN

## 2022-12-31 MED ORDER — LIDOCAINE-EPINEPHRINE (PF) 1 %-1:200000 IJ SOLN
INTRAMUSCULAR | Status: DC | PRN
  Administered 2022-12-31: 10 mL via INTRADERMAL

## 2022-12-31 MED ORDER — AMLODIPINE BESYLATE 5 MG PO TABS
5.0000 mg | ORAL_TABLET | Freq: Every day | ORAL | Status: DC
  Administered 2023-01-01: 5 mg via ORAL
  Filled 2022-12-31: qty 1

## 2022-12-31 MED ORDER — ALUM & MAG HYDROXIDE-SIMETH 200-200-20 MG/5ML PO SUSP: 15.0000 mL | ORAL | Status: DC | PRN

## 2022-12-31 MED ORDER — PHENYLEPHRINE HCL-NACL 20-0.9 MG/250ML-% IV SOLN
INTRAVENOUS | Status: AC
  Filled 2022-12-31: qty 250

## 2022-12-31 MED ORDER — HEPARIN (PORCINE) IN NACL 2000-0.9 UNIT/L-% IV SOLN
INTRAVENOUS | Status: DC | PRN
  Administered 2022-12-31: 1000 mL

## 2022-12-31 MED ORDER — DIPHENHYDRAMINE HCL 50 MG/ML IJ SOLN: 50.0000 mg | Freq: Once | INTRAMUSCULAR | Status: DC | PRN

## 2022-12-31 MED ORDER — VANCOMYCIN HCL IN DEXTROSE 1-5 GM/200ML-% IV SOLN
INTRAVENOUS | Status: AC
  Filled 2022-12-31: qty 200

## 2022-12-31 SURGICAL SUPPLY — 23 items
BALLN VTRAC 4.5X30X135 (BALLOONS) ×1
BALLOON VTRAC 4.5X30X135 (BALLOONS) IMPLANT
CATH ANGIO 5F PIGTAIL 100CM (CATHETERS) IMPLANT
CATH BEACON 5 .035 100 H1 TIP (CATHETERS) IMPLANT
CATH BEACON 5 .035 100 JB2 TIP (CATHETERS) IMPLANT
COVER DRAPE FLUORO 36X44 (DRAPES) IMPLANT
COVER PROBE ULTRASOUND 5X96 (MISCELLANEOUS) IMPLANT
DEVICE EMBOSHIELD NAV6 4.0-7.0 (FILTER) IMPLANT
DEVICE STARCLOSE SE CLOSURE (Vascular Products) IMPLANT
DEVICE TORQUE (MISCELLANEOUS) IMPLANT
GLIDEWIRE ANGLED SS 035X260CM (WIRE) IMPLANT
GUIDEWIRE PFTE-COATED .018X300 (WIRE) IMPLANT
KIT CAROTID MANIFOLD (MISCELLANEOUS) IMPLANT
KIT ENCORE 26 ADVANTAGE (KITS) IMPLANT
PACK ANGIOGRAPHY (CUSTOM PROCEDURE TRAY) ×1 IMPLANT
SHEATH BRITE TIP 6FRX11 (SHEATH) IMPLANT
SHEATH NEURON MAX 6FR 90CM (SHEATH) IMPLANT
STENT XACT CAR 9-7X40X136 (Permanent Stent) IMPLANT
SUT MNCRL AB 4-0 PS2 18 (SUTURE) IMPLANT
SYR MEDRAD MARK 7 150ML (SYRINGE) IMPLANT
WIRE BAREWIRE WORK .014X315CM (WIRE) IMPLANT
WIRE G VAS 035X260 STIFF (WIRE) IMPLANT
WIRE GUIDERIGHT .035X150 (WIRE) IMPLANT

## 2022-12-31 NOTE — Progress Notes (Signed)
Pt. Had approx. 30 sec. Run of ST in the 160's that broke spontaneously. Dr. Wyn Quaker made aware. Pt. States he sees Dr. Mariah Milling for his BP. Marland Kitchen

## 2022-12-31 NOTE — Interval H&P Note (Signed)
History and Physical Interval Note:  12/31/2022 8:13 AM  Gerald Bauer  has presented today for surgery, with the diagnosis of L Carotid Stent   ABBOTT   Carotid artery stenosis.  The various methods of treatment have been discussed with the patient and family. After consideration of risks, benefits and other options for treatment, the patient has consented to  Procedure(s): CAROTID PTA/STENT INTERVENTION (Left) as a surgical intervention.  The patient's history has been reviewed, patient examined, no change in status, stable for surgery.  I have reviewed the patient's chart and labs.  Questions were answered to the patient's satisfaction.     Festus Barren

## 2022-12-31 NOTE — Plan of Care (Signed)
Patient remains on AR-ICU at this time. Patient receiving IV antibiotics. Routine NIHSS checks.   Problem: Education: Goal: Knowledge of General Education information will improve Description: Including pain rating scale, medication(s)/side effects and non-pharmacologic comfort measures Outcome: Progressing   Problem: Health Behavior/Discharge Planning: Goal: Ability to manage health-related needs will improve Outcome: Progressing   Problem: Clinical Measurements: Goal: Ability to maintain clinical measurements within normal limits will improve Outcome: Progressing Goal: Will remain free from infection Outcome: Progressing Goal: Diagnostic test results will improve Outcome: Progressing Goal: Respiratory complications will improve Outcome: Progressing Goal: Cardiovascular complication will be avoided Outcome: Progressing   Problem: Activity: Goal: Risk for activity intolerance will decrease Outcome: Progressing   Problem: Nutrition: Goal: Adequate nutrition will be maintained Outcome: Progressing   Problem: Coping: Goal: Level of anxiety will decrease Outcome: Progressing   Problem: Elimination: Goal: Will not experience complications related to bowel motility Outcome: Progressing Goal: Will not experience complications related to urinary retention Outcome: Progressing   Problem: Pain Managment: Goal: General experience of comfort will improve Outcome: Progressing   Problem: Safety: Goal: Ability to remain free from injury will improve Outcome: Progressing   Problem: Skin Integrity: Goal: Risk for impaired skin integrity will decrease Outcome: Progressing   Problem: Education: Goal: Knowledge of discharge needs will improve Outcome: Progressing   Problem: Clinical Measurements: Goal: Postoperative complications will be avoided or minimized Outcome: Progressing   Problem: Respiratory: Goal: Ability to achieve and maintain a regular respiratory rate will  improve Outcome: Progressing   Problem: Skin Integrity: Goal: Demonstration of wound healing without infection will improve Outcome: Progressing

## 2022-12-31 NOTE — Op Note (Signed)
OPERATIVE NOTE DATE: 12/31/2022  PROCEDURE:  Ultrasound guidance for vascular access right femoral artery  Placement of a 9 mm proximal, 7 mm distal, 4 cm long Exact stent with the use of the NAV-6 embolic protection device in the left carotid artery  PRE-OPERATIVE DIAGNOSIS: 1. High grade left carotid artery stenosis.   POST-OPERATIVE DIAGNOSIS:  Same as above  SURGEON: Festus Barren, MD  ASSISTANT(S):  none  ANESTHESIA: local/MCS  ESTIMATED BLOOD LOSS:  50 cc  CONTRAST: 65 cc  FLUORO TIME: 6.3 minutes  MODERATE CONSCIOUS SEDATION TIME:  Approximately 53 minutes using 2 mg of Versed and 75 mcg of Fentanyl  FINDING(S): 1.   95% left carotid artery stenosis  SPECIMEN(S):   none  INDICATIONS:   Patient is a 77 y.o. male who presents with high grade left carotid artery stenosis.  The patient has a relatively high lesion and carotid artery stenting was felt to be preferred to endarterectomy for that reason. I have completed Share Decision Making with Serita Butcher prior to surgery.  Conversations included: -Discussion of all treatment options including carotid endarterectomy (CEA), CAS (which includes transcarotid artery revascularization (TCAR)), and optimal medical therapy (OMT)). -Explanation of risks and benefits for each option specific to Gerald Bauer's clinical situation. -Integration of clinical guidelines as it relates to the patient's history and co-morbidities -Discussion and incorporation of Averie Meiner and their personal preferences and priorities in choosing a treatment plan.    Risks and benefits were discussed and informed consent was obtained.   DESCRIPTION: After obtaining full informed written consent, the patient was brought back to the vascular suite and placed supine upon the table.  The patient received IV antibiotics prior to induction. Moderate conscious sedation was administered during a face to face encounter with the patient throughout  the procedure with my supervision of the RN administering medicines and monitoring the patients vital signs and mental status throughout from the start of the procedure until the patient was taken to the recovery room.  After obtaining adequate anesthesia, the patient was prepped and draped in the standard fashion.   The right femoral artery was visualized with ultrasound and found to be widely patent. It was then accessed under direct ultrasound guidance without difficulty with a Seldinger needle. A permanent image was recorded. A J-wire was placed and we then placed a 6 French sheath. The patient was then heparinized and a total of 8000 units of intravenous heparin were given and an ACT was checked to confirm successful anticoagulation. A pigtail catheter was then placed into the ascending aorta. This showed a type 3 aortic arch without proximal stenosis. I then selectively cannulated the left common carotid artery without difficulty with a H1 catheter and advanced into the mid left common carotid artery.  Cervical and cerebral carotid angiography was then performed. There were no obvious intracranial filling defects with no anterior cerebral flow seen and somewhat sluggish middle cerebral flow. The carotid bifurcation demonstrated an approximately 95% left internal carotid artery stenosis just distal to the origin.  I then advanced into the external carotid artery with a Glidewire and the H1 catheter and then exchanged for the Amplatz Super Stiff wire. Over the Amplatz Super Stiff wire, a 6 French Neuron Max sheath was placed into the mid common carotid artery. I then used the NAV-6  Embolic protection device and crossed the lesion and parked this in the distal internal carotid artery at the base of the skull.  Due to some  tortuosity in the distal internal carotid artery, this was done with a buddy wire system with a 0.018 advantage wire without difficulty.  Once the embolic protection device was up, the  advantage wire was removed.  I then selected a 9 mm proximal, 7 mm distal, 4 cm long exact stent. This was deployed across the lesion encompassing it in its entirety. A 4.5 mm diameter by 3 cm length balloon was used to post dilate the stent. Only about a 20% residual stenosis was present after angioplasty. Completion angiogram showed normal intracranial filling without new defects. At this point I elected to terminate the procedure. The sheath was removed and StarClose closure device was deployed in the right femoral artery with excellent hemostatic result. The patient was taken to the recovery room in stable condition having tolerated the procedure well.  COMPLICATIONS: none  CONDITION: stable  Festus Barren 12/31/2022 10:41 AM   This note was created with Dragon Medical transcription system. Any errors in dictation are purely unintentional.

## 2022-12-31 NOTE — Progress Notes (Signed)
Dr. Wyn Quaker at bedside, speaking with pt. And his family x2 re: carotid stent procedure. All persons verbalized understanding of conversation.

## 2023-01-01 LAB — CBC
HCT: 36 % — ABNORMAL LOW (ref 39.0–52.0)
Hemoglobin: 12.5 g/dL — ABNORMAL LOW (ref 13.0–17.0)
MCH: 30.9 pg (ref 26.0–34.0)
MCHC: 34.7 g/dL (ref 30.0–36.0)
MCV: 88.9 fL (ref 80.0–100.0)
Platelets: 189 10*3/uL (ref 150–400)
RBC: 4.05 MIL/uL — ABNORMAL LOW (ref 4.22–5.81)
RDW: 12.1 % (ref 11.5–15.5)
WBC: 7.8 10*3/uL (ref 4.0–10.5)
nRBC: 0 % (ref 0.0–0.2)

## 2023-01-01 LAB — BASIC METABOLIC PANEL
Anion gap: 4 — ABNORMAL LOW (ref 5–15)
BUN: 12 mg/dL (ref 8–23)
CO2: 23 mmol/L (ref 22–32)
Calcium: 8.2 mg/dL — ABNORMAL LOW (ref 8.9–10.3)
Chloride: 110 mmol/L (ref 98–111)
Creatinine, Ser: 0.71 mg/dL (ref 0.61–1.24)
GFR, Estimated: >60 mL/min (ref >60)
Glucose, Bld: 162 mg/dL — ABNORMAL HIGH (ref 70–99)
Potassium: 3.6 mmol/L (ref 3.5–5.1)
Sodium: 137 mmol/L (ref 135–145)

## 2023-01-01 NOTE — Discharge Summary (Signed)
[facility] VASCULAR & VEIN SPECIALISTS    Discharge Summary    Patient ID:  Gerald Bauer MRN: [id number] DOB/AGE: [birth year] 78 y.o.  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 Noted on CT Abd in 2012.   Arthritis    hands   Carotid atherosclerosis    a. 02/2018 U/S: RICA 1-39%, LICA 40-59%; b. 09/2021 MRA Head/Neck: near complete occlusion L ICA and L MCA. Patent RICA.   Diabetes mellitus without complication (HCC)    Diastolic dysfunction    a. 09/2021 Echo: EF 55-60%, no rwma, GrI DD, nl RV fxn, RVSP 14.9mmHg.   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 09/2021 MRI/MRA Brain: Acute L MCA distribution infarct. Near complete occlusion L ICA and MCA.   Tachycardia    Tinea pedis of both feet 01/10/2017    Procedure(s): CAROTID PTA/STENT INTERVENTION  Discharged Condition: good  HPI:  Gerald Bauer is a 77 yo male who is now POD #1 from endovascular left carotid stent placement.  Patient is resting comfortably in bed this morning.  Patient has no complaints.  Vitals all remained stable.  Patient is recovering as expected.  Patient to be discharged later today.  Patient will be discharged on ASA 81 mg daily, Plavix 75 mg daily.  Patient has noted statin allergy on his chart.  I discussed the allergy with the patient and daughter.  Neither the patient or his daughter who helps him with his  medical care know of any such allergy.  At this time they would defer to going back to their PCP to discuss starting some type of statin therapy.  Therefore the patient will not be discharged on any statin at this time.  Recommend follow-up with PCP as soon as possible.  Hospital Course:  Gerald Bauer is a 77 y.o. male is S/P Left carotid endovascular stent placement. Extubated: POD # 0 Physical Exam:  Alert notes x3, no acute distress Face: Symmetrical.  Tongue is midline. Neck: Trachea is midline.  No swelling or bruising. Cardiovascular: Regular rate and rhythm Pulmonary: Clear to auscultation bilaterally Abdomen: Soft, nontender, nondistended Right groin access: Clean dry and intact.  No swelling or drainage noted Left lower extremity: Thigh soft.  Calf soft.  Extremities warm distally toes. Hard to palpate pedal pulses however the foot is warm is her good capillary refill. Right lower extremity: Thigh soft.  Calf soft.  Extremities warm distally toes. Hard to palpate pedal pulses however the foot is warm is her good capillary refill. Neurological: No deficits noted   Post-op wounds:  clean, dry, intact or healing well  Pt. Ambulating, voiding and taking PO diet without difficulty. Pt pain controlled with PO pain meds.  Labs:  As below  Complications: none  Consults:    Significant Diagnostic Studies: CBC Lab Results  Component Value Date   WBC 7.8 01/01/2023  HGB 12.5 (L) 01/01/2023   HCT 36.0 (L) 01/01/2023   MCV 88.9 01/01/2023   PLT 189 01/01/2023    BMET    Component Value Date/Time   NA 137 01/01/2023 0420   NA 141 01/20/2019 0930   K 3.6 01/01/2023 0420   CL 110 01/01/2023 0420   CO2 23 01/01/2023 0420   GLUCOSE 162 (H) 01/01/2023 0420   BUN 12 01/01/2023 0420   BUN 11 01/20/2019 0930   CREATININE 0.71 01/01/2023 0420   CREATININE 0.80 08/08/2022 1059   CALCIUM 8.2 (L) 01/01/2023 0420   GFRNONAA >60 01/01/2023 0420   GFRNONAA 85 04/25/2020  1557   GFRAA 99 04/25/2020 1557   COAG Lab Results  Component Value Date   INR 1.1 10/05/2021     Disposition:  Discharge to :Home  Allergies as of 01/01/2023       Reactions   Amoxicillin Swelling, Other (See Comments)   Statins Other (See Comments)   Affects the liver   Welchol [colesevelam Hcl] Other (See Comments)   affects the liver        Medication List     TAKE these medications    acetaminophen 325 MG tablet Commonly known as: TYLENOL Take 2 tablets (650 mg total) by mouth every 4 (four) hours as needed for mild pain (or temp > 37.5 C (99.5 F)).   amLODipine 5 MG tablet Commonly known as: NORVASC Take 1 tablet (5 mg total) by mouth daily.   aspirin EC 81 MG tablet Take 81 mg by mouth daily. Swallow whole.   clopidogrel 75 MG tablet Commonly known as: PLAVIX Take 1 tablet (75 mg total) by mouth daily.   metFORMIN 500 MG 24 hr tablet Commonly known as: GLUCOPHAGE-XR TAKE 1 TABLET BY MOUTH TWICE  DAILY WITH MEALS   OneTouch Delica Plus Lancet33G Misc USE WITH METER TO CHECK BLOOD  GLUCOSE ONCE DAILY AS DIRECTED   OneTouch Verio test strip Generic drug: glucose blood CHECK FINGERSTICK BLOOD SUGARS  ONCE DAILY   Repatha SureClick 140 MG/ML Soaj Generic drug: Evolocumab Inject 140 mg Subcutaneously every 2 weeks.   Vitamin B12 500 MCG Tabs Take 500 mcg by mouth daily.   Vitamin D3 25 MCG (1000 UT) Caps Take 1,000 Units by mouth daily.       Verbal and written Discharge instructions given to the patient. Wound care per Discharge AVS  Follow-up Information     Dew, Marlow Baars, MD Follow up in 1 month(s).   Specialties: Vascular Surgery, Radiology, Interventional Cardiology Why: Carotid Ultrasounds Contact information: [address] [place] [address] [phone]                 Signed: Marcie Bal, NP  01/01/2023, 10:41 AM

## 2023-01-01 NOTE — Discharge Instructions (Signed)
Do not lift anything heavy.  Do not lift anything more than a gallon of milk.  Do not exercise for the next 2 weeks.  We do expect you to walk is much as possible.  Do not shower today when you get home.  You may shower tomorrow on 01/02/2023.  Shower with the old dressing in place on your right groin.  Remove dressing immediately after showering.  Cover incision site with a Band-Aid for the next 3 days.  No driving for the next 2 weeks.  Follow-up with vein and vascular surgery in clinic as scheduled.  Please take your medications as prescribed it is important that you take aspirin 81 mg daily, Plavix 75 mg daily to ensure that your carotid stent stays open.

## 2023-01-01 NOTE — TOC CM/SW Note (Signed)
Transition of Care [facility]) - Inpatient Brief Assessment   Patient Details  Name: Espn Zeman MRN: [id number] Date of Birth: September 19, 1945  [facility]) CM/SW Contact:    Margarito Liner, LCSW Phone Number: 01/01/2023, 11:00 AM   Clinical Narrative: Patient has orders to discharge home today. Chart reviewed. No TOC needs identified. CSW signing off.  Transition of Care Asessment: Insurance and Status: Insurance coverage has been reviewed Patient has primary care physician: Yes Home environment has been reviewed: Single family home   Prior/Current Home Services: No current home services Social Determinants of Health Reivew: SDOH reviewed no interventions necessary Readmission risk has been reviewed: Yes Transition of care needs: no transition of care needs at this time

## 2023-01-02 ENCOUNTER — Telehealth: Payer: Self-pay

## 2023-01-02 NOTE — Transitions of Care (Post Inpatient/ED Visit) (Signed)
01/02/2023  Name: Gerald Bauer MRN: [id number] DOB: 10/16/1945  Today's TOC FU Call Status: Today's TOC FU Call Status:: Successful TOC FU Call Completed TOC FU Call Complete Date: 01/02/23 Patient's Name and Date of Birth confirmed.  Transition Care Management Follow-up Telephone Call Date of Discharge: 01/01/23 Discharge Facility: [facility]) Type of Discharge: Inpatient Admission Primary Inpatient Discharge Diagnosis:: cartoid artery stenosis on the left with stent placement How have you been since you were released from the hospital?: Better (reports that he feels better since surgery) Any questions or concerns?: No  Items Reviewed: Did you receive and understand the discharge instructions provided?: Yes Medications obtained,verified, and reconciled?: Yes (Medications Reviewed) Any new allergies since your discharge?: No Dietary orders reviewed?: Yes Do you have support at home?: Yes People in Home: child(ren), adult  Medications Reviewed Today: Medications Reviewed Today     Reviewed by Earlie Server, RN (Registered Nurse) on 01/02/23 at 1137  Med List Status: <None>   Medication Order Taking? Sig Documenting Provider Last Dose Status Informant  acetaminophen (TYLENOL) 325 MG tablet [id number] Yes Take 2 tablets (650 mg total) by mouth every 4 (four) hours as needed for mild pain (or temp > 37.5 C (99.5 F)). Setzer, Lynnell Jude, PA-C Taking Active Self  amLODipine (NORVASC) 5 MG tablet [id number] Yes Take 1 tablet (5 mg total) by mouth daily. Antonieta Iba, MD Taking Active Self  aspirin EC 81 MG tablet [id number] Yes Take 81 mg by mouth daily. Swallow whole. [provider] Taking Active Self  Cholecalciferol (VITAMIN D3) 25 MCG (1000 UT) CAPS [id number] Yes Take 1,000 Units by mouth daily. [provider] Taking Active Self  clopidogrel (PLAVIX) 75 MG tablet [id number] Yes Take 1 tablet (75 mg total) by mouth daily. Antonieta Iba, MD Taking Active Self  Cyanocobalamin (VITAMIN B12) 500 MCG TABS [id number] Yes Take 500 mcg by mouth daily. [provider] Taking Active Self  Evolocumab (REPATHA SURECLICK) 140 MG/ML SOAJ [id number] Yes Inject 140 mg Subcutaneously every 2 weeks. Antonieta Iba, MD Taking Active Self  Lancets [facility] DELICA PLUS [place]) MISC [id number] Yes USE WITH METER TO CHECK BLOOD  GLUCOSE ONCE DAILY AS DIRECTED Danelle Berry, PA-C Taking Active Self  metFORMIN (GLUCOPHAGE-XR) 500 MG 24 hr tablet [id number] Yes TAKE 1 TABLET BY MOUTH TWICE  DAILY WITH MEALS Danelle Berry, PA-C Taking Active Self  ONETOUCH VERIO test strip [id number] Yes CHECK FINGERSTICK BLOOD SUGARS  ONCE DAILY Danelle Berry, PA-C Taking Active Self            Home Care and Equipment/Supplies: Were Home Health Services Ordered?: No Any new equipment or medical supplies ordered?: No  Functional Questionnaire: Do you need assistance with bathing/showering or dressing?: No Do you need assistance with meal preparation?: No Do you need assistance with eating?: No Do you have difficulty maintaining continence: No Do you need assistance with getting out of bed/getting out of a chair/moving?: Yes (right leg weakness due to stroke.  Uses a Cane. This is not new) Do you have difficulty managing or taking your medications?: Yes  Follow up appointments reviewed: PCP Follow-up appointment confirmed?: No (daughter in law will call herself. reviewed importance) MD Provider Line [phone] Given: Yes Specialist Hospital Follow-up appointment confirmed?: Yes Date of Specialist follow-up appointment?: 02/06/23 Follow-Up Specialty Provider:: Dr. Wyn Quaker Do you need transportation to your follow-up appointment?: No Do you understand care options if your condition(s) worsen?: Yes-patient verbalized understanding  SDOH Interventions Today  Flowsheet Row Most Recent Value  SDOH Interventions   Food Insecurity  Interventions Intervention Not Indicated  Transportation Interventions Intervention Not Indicated      Reviewed TOC with daughter in law. [name] is on the [facility].  [name] reports that patient is doing very well. Reports he is checked on frequently.  No PCP follow up as of yet and daughter in law wants to call and make appointment her self. Declined my assistance with this.  Reviewed importance of conversation with PCP about statins.  Offered 30 day TOC program and daughter in law declined. Provided my contact information if patient or daughter in law needs to call me in the future.  Lonia Chimera, RN, BSN, CEN [facility] [phone]

## 2023-01-17 ENCOUNTER — Ambulatory Visit: Payer: Medicare Other

## 2023-01-17 VITALS — BP 106/62 | Ht 68.0 in | Wt 169.3 lb

## 2023-01-17 DIAGNOSIS — Z0000 Encounter for general adult medical examination without abnormal findings: Secondary | ICD-10-CM

## 2023-01-17 NOTE — Patient Instructions (Signed)
Gerald Bauer , Thank you for taking time to come for your Medicare Wellness Visit. I appreciate your ongoing commitment to your health goals. Please review the following plan we discussed and let me know if I can assist you in the future.   Referrals/Orders/Follow-Ups/Clinician Recommendations: none  This is a list of the screening recommended for you and due dates:  Health Maintenance  Topic Date Due   COVID-19 Vaccine (2 - Janssen risk series) 02/02/2023*   Colon Cancer Screening  02/17/2023*   Hemoglobin A1C  02/08/2023   Eye exam for diabetics  03/10/2023   Complete foot exam   05/08/2023   Yearly kidney health urinalysis for diabetes  08/08/2023   Yearly kidney function blood test for diabetes  01/01/2024   Medicare Annual Wellness Visit  01/17/2024   DTaP/Tdap/Td vaccine (2 - Tdap) 03/04/2024   Pneumonia Vaccine  Completed   Flu Shot  Completed   Hepatitis C Screening  Completed   Zoster (Shingles) Vaccine  Completed   HPV Vaccine  Aged Out  *Topic was postponed. The date shown is not the original due date.    Advanced directives: (Copy Requested) Please bring a copy of your health care power of attorney and living will to the office to be added to your chart at your convenience.  Next Medicare Annual Wellness Visit scheduled for next year: Yes  01/23/24 @ 3pm in person

## 2023-01-17 NOTE — Progress Notes (Signed)
Subjective:   Gerald Bauer is a 77 y.o. male who presents for Medicare Annual/Subsequent preventive examination.  Visit Complete: In person  Patient Medicare AWV questionnaire was completed by the patient on (not done); I have confirmed that all information answered by patient is correct and no changes since this date.  Cardiac Risk Factors include: advanced age (>30men, >6 women);dyslipidemia;hypertension;male gender;sedentary lifestyle    Objective:    Today's Vitals   01/17/23 1426  Weight: 169 lb 4.8 oz (76.8 kg)  Height: 5\' 8"  (1.727 m)   Body mass index is 25.74 kg/m.     01/17/2023    2:53 PM 12/31/2022   12:00 PM 12/31/2022    8:28 AM 11/06/2021    2:09 PM 10/09/2021    3:00 PM 10/08/2021    7:27 AM 10/06/2021    3:00 PM  Advanced Directives  Does Patient Have a Medical Advance Directive? Yes Yes Yes Yes Yes  No  Type of Estate agent of [place];Living will Healthcare Power of [employer] Power of [employer] Power of [employer] Power of Attorney    Does patient want to make changes to medical advance directive?  No - Patient declined       Copy of Healthcare Power of Attorney in Chart? No - copy requested No - copy requested       Would patient like information on creating a medical advance directive?      No - Patient declined     Current Medications (verified) Outpatient Encounter Medications as of 01/17/2023  Medication Sig   acetaminophen (TYLENOL) 325 MG tablet Take 2 tablets (650 mg total) by mouth every 4 (four) hours as needed for mild pain (or temp > 37.5 C (99.5 F)).   amLODipine (NORVASC) 5 MG tablet Take 1 tablet (5 mg total) by mouth daily.   aspirin EC 81 MG tablet Take 81 mg by mouth daily. Swallow whole.   Cholecalciferol (VITAMIN D3) 25 MCG (1000 UT) CAPS Take 1,000 Units by mouth daily.   clopidogrel (PLAVIX) 75 MG tablet Take 1 tablet (75 mg total) by mouth daily.   Cyanocobalamin (VITAMIN B12)  500 MCG TABS Take 500 mcg by mouth daily.   Evolocumab (REPATHA SURECLICK) 140 MG/ML SOAJ Inject 140 mg Subcutaneously every 2 weeks.   Lancets (ONETOUCH DELICA PLUS LANCET33G) MISC USE WITH METER TO CHECK BLOOD  GLUCOSE ONCE DAILY AS DIRECTED   metFORMIN (GLUCOPHAGE-XR) 500 MG 24 hr tablet TAKE 1 TABLET BY MOUTH TWICE  DAILY WITH MEALS   ONETOUCH VERIO test strip CHECK FINGERSTICK BLOOD SUGARS  ONCE DAILY   No facility-administered encounter medications on file as of 01/17/2023.    Allergies (verified) Amoxicillin, Statins, and Welchol [colesevelam hcl]   History: Past Medical History:  Diagnosis Date   Acid reflux    Aortic atherosclerosis (HCC)    a. Noted on CT Abd in 2012.   Arthritis    hands   Carotid atherosclerosis    a. 02/2018 U/S: RICA 1-39%, LICA 40-59%; b. 09/2021 MRA Head/Neck: near complete occlusion L ICA and L MCA. Patent RICA.   Diabetes mellitus without complication (HCC)    Diastolic dysfunction    a. 09/2021 Echo: EF 55-60%, no rwma, GrI DD, nl RV fxn, RVSP 14.20mmHg.   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 09/2021 MRI/MRA Brain: Acute L MCA distribution infarct. Near complete occlusion L ICA and MCA.   Tachycardia    Tinea pedis of both feet 01/10/2017   Past Surgical History:  Procedure Laterality Date   APPENDECTOMY  1973   CAROTID PTA/STENT INTERVENTION Left 12/31/2022   Procedure: CAROTID PTA/STENT INTERVENTION;  Surgeon: Annice Needy, MD;  Location: [facility] INVASIVE CV LAB;  Service: Cardiovascular;  Laterality: Left;   CHOLECYSTECTOMY  2013   PROSTATECTOMY  2012   Family History  Problem Relation Age of Onset   Dementia Mother    High Cholesterol Mother     Hypertension Mother    Arthritis Mother    Hyperlipidemia Mother    Heart disease Father    Heart attack Father    Arthritis Maternal Grandmother    Cancer Maternal Grandmother        colon   Prostate cancer Brother    Cancer Brother        prostate   Cancer Maternal Uncle        colon   COPD Maternal Uncle    Stroke Paternal Grandmother    Diabetes Paternal Grandmother    Aneurysm Paternal Grandfather    Social History   Socioeconomic History   Marital status: Widowed    Spouse name: Venita Sheffield   Number of children: 2   Years of education: some college   Highest education level: 12th grade  Occupational History   Occupation: Retired    Comment: [employer]  Tobacco Use   Smoking status: Former    Current packs/day: 0.00    Average packs/day: 1 pack/day for 40.0 years (40.0 ttl pk-yrs)    Types: Cigarettes    Start date: 03/09/1957    Quit date: 03/09/1997    Years since quitting: 25.8   Smokeless tobacco: Former   Tobacco comments:    smoking cessation materials not required  Vaping Use   Vaping status: Never Used  Substance and Sexual Activity   Alcohol use: No    Alcohol/week: 0.0 standard drinks of alcohol   Drug use: No   Sexual activity: Never  Other Topics Concern   Not on file  Social History Narrative   Pt lives alone   Social Determinants of Health   Financial Resource Strain: Low Risk  (01/17/2023)   Overall Financial Resource Strain (CARDIA)    Difficulty of Paying Living Expenses: Not very hard  Food Insecurity: No Food Insecurity (01/17/2023)   Hunger Vital Sign    Worried About Running Out of Food in the Last Year: Never true    Ran Out of Food in the Last Year: Never true  Transportation Needs: No Transportation Needs (01/17/2023)   PRAPARE - Administrator, Civil Service (Medical): No    Lack of Transportation (Non-Medical): No  Physical Activity: Inactive (01/17/2023)   Exercise Vital Sign    Days of Exercise per Week: 0 days     Minutes of Exercise per Session: 0 min  Stress: No Stress Concern Present (01/17/2023)   [employer] of Occupational Health - Occupational Stress Questionnaire    Feeling of Stress : Not at all  Social Connections: Moderately Integrated (01/17/2023)   Social Connection and Isolation Panel [NHANES]    Frequency of Communication with Friends and Family: Three times a week  Frequency of Social Gatherings with Friends and Family: Three times a week    Attends Religious Services: More than 4 times per year    Active Member of Clubs or Organizations: No    Attends Banker Meetings: 1 to 4 times per year    Marital Status: Widowed    Tobacco Counseling Counseling given: Not Answered Tobacco comments: smoking cessation materials not required   Clinical Intake:  Pre-visit preparation completed: Yes  Pain : No/denies pain     BMI - recorded: 25.74 Nutritional Status: BMI 25 -29 Overweight Nutritional Risks: None Diabetes: Yes CBG done?: No Did pt. bring in CBG monitor from home?: No  How often do you need to have someone help you when you read instructions, pamphlets, or other written materials from your doctor or pharmacy?: 1 - Never  Interpreter Needed?: No  Comments: lives alone Information entered by :: B.Athalene Kolle,LPN   Activities of Daily Living    01/17/2023    2:54 PM 12/31/2022   12:00 PM  In your present state of health, do you have any difficulty performing the following activities:  Hearing? 1 0  Vision? 0 0  Difficulty concentrating or making decisions? 1 0  Walking or climbing stairs? 1   Dressing or bathing? 0   Doing errands, shopping? 1 0  Preparing Food and eating ? N   Using the Toilet? N   In the past six months, have you accidently leaked urine? N   Do you have problems with loss of bowel control? N   Managing your Medications? N   Managing your Finances? N   Housekeeping or managing your Housekeeping? N     Patient Care  Team: Danelle Berry, PA-C as PCP - General (Family Medicine) Antonieta Iba, MD as PCP - Cardiology (Cardiology) Gaspar Cola, [facility] (Inactive) as Pharmacist (Pharmacist) Antonieta Iba, MD as Consulting Physician (Cardiology) Pa, [facility] (Optometry)  Indicate any recent Medical Services you may have received from other than [facility] providers in the past year (date may be approximate).     Assessment:   This is a routine wellness examination for Katelyn.  Hearing/Vision screen Hearing Screening - Comments:: Pt says hearing is not good but declines hearing aids Vision Screening - Comments:: Pt says has glasses but does not wear [place] [facility]   Goals Addressed             This Visit's Progress    DIET - INCREASE WATER INTAKE   Not on track    Recommend to drink at least 6-8 8oz glasses of water per day.     Monitor and Manage My Blood Sugar-Diabetes Type 2   On track    Timeframe:  Long-Range Goal Priority:  High Start Date:    05/05/2020                         Expected End Date:   03/19/2022                    Follow Up Date 01/22/2021     - check blood sugar daily before breakfast - check blood sugar if I feel it is too high or too low    Why is this important?   Checking your blood sugar at home helps to keep it from getting very high or very low.  Writing the results in a diary or log helps the doctor know how to care  for you.  Your blood sugar log should have the time, date and the results.  Also, write down the amount of insulin or other medicine that you take.  Other information, like what you ate, exercise done and how you were feeling, will also be helpful.     Notes:      Patient Stated   Not on track    Walk 10,000 steps daily       Depression Screen    01/17/2023    2:47 PM 10/30/2022   10:38 AM 08/08/2022   10:27 AM 02/07/2022    1:27 PM 12/15/2021   11:27 AM 11/07/2021    1:43 PM 09/14/2021    1:30 PM  PHQ 2/9 Scores  PHQ - 2 Score 2  2 0 1 0 3 0  PHQ- 9 Score 4 2 0 1 0 4     Fall Risk    01/17/2023    2:42 PM 10/30/2022   10:38 AM 08/08/2022   10:27 AM 02/07/2022    1:26 PM 12/15/2021   11:26 AM  Fall Risk   Falls in the past year? 1 1 1 1    Number falls in past yr: 1 1 1 1 1   Injury with Fall? 1 0 1 1 1   Comment     Hurt Right Wrist  Risk for fall due to : Impaired mobility;Impaired balance/gait;History of fall(s) Impaired balance/gait  Impaired balance/gait   Follow up Education provided;Falls prevention discussed Falls prevention discussed;Education provided;Falls evaluation completed  Falls prevention discussed;Education provided;Falls evaluation completed     MEDICARE RISK AT HOME: Medicare Risk at Home Any stairs in or around the home?: Yes If so, are there any without handrails?: Yes Home free of loose throw rugs in walkways, pet beds, electrical cords, etc?: Yes Adequate lighting in your home to reduce risk of falls?: Yes Life alert?: No Use of a cane, walker or w/c?: Yes Grab bars in the bathroom?: Yes Shower chair or bench in shower?: Yes Elevated toilet seat or a handicapped toilet?: Yes  TIMED UP AND GO:  Was the test performed?  Yes  Length of time to ambulate 10 feet: 20 sec Gait slow and steady with assistive device    Cognitive Function:    03/09/2014    1:41 PM  MMSE - Mini Mental State Exam  Orientation to time 5  Orientation to Place 4  Registration 3  Attention/ Calculation 5  Recall 2  Language- name 2 objects 2  Language- repeat 1  Language- follow 3 step command 3  Language- read & follow direction 1  Write a sentence 1  Copy design 1  Total score 28        01/17/2023    2:56 PM 09/10/2019    1:55 PM 09/09/2018    1:38 PM 09/03/2017    1:24 PM 07/11/2016    8:49 AM  6CIT Screen  What Year? 4 points 0 points 0 points 0 points 0 points  What month? 3 points 0 points 0 points 0 points 0 points  What time? 0 points  0 points 0 points 0 points  Count back from 20 0  points 0 points 0 points 0 points 0 points  Months in reverse 0 points 0 points 0 points 0 points 0 points  Repeat phrase 0 points 0 points 0 points 2 points 4 points  Total Score 7 points  0 points 2 points 4 points    Immunizations Immunization History  Administered Date(s)  Administered   Fluad Quad(high Dose 65+) 01/20/2019, 04/25/2020, 01/23/2021   Fluad Trivalent(High Dose 65+) 01/01/2023   Influenza, High Dose Seasonal PF 01/10/2017, 01/13/2018   Influenza-Unspecified 01/10/2012, 01/01/2013, 01/01/2015   Janssen (J&J) SARS-COV-2 Vaccination 09/08/2019   Pneumococcal Conjugate-13 07/30/2013   Pneumococcal Polysaccharide-23 02/27/2011   Td 03/04/2014   Zoster Recombinant(Shingrix) 07/01/2016, 01/31/2017   Zoster, Live 02/17/2014    TDAP status: Up to date  Flu Vaccine status: Up to date  Pneumococcal vaccine status: Up to date  Covid-19 vaccine status: Completed vaccines  Qualifies for Shingles Vaccine? Yes   Zostavax completed Yes   Shingrix Completed?: Yes  Screening Tests Health Maintenance  Topic Date Due   COVID-19 Vaccine (2 - Janssen risk series) 02/02/2023 (Originally 10/06/2019)   Colonoscopy  02/17/2023 (Originally 10/01/2018)   HEMOGLOBIN A1C  02/08/2023   OPHTHALMOLOGY EXAM  03/10/2023   FOOT EXAM  05/08/2023   Diabetic kidney evaluation - Urine ACR  08/08/2023   Diabetic kidney evaluation - eGFR measurement  01/01/2024   Medicare Annual Wellness (AWV)  01/17/2024   DTaP/Tdap/Td (2 - Tdap) 03/04/2024   Pneumonia Vaccine 52+ Years old  Completed   INFLUENZA VACCINE  Completed   Hepatitis C Screening  Completed   Zoster Vaccines- Shingrix  Completed   HPV VACCINES  Aged Out    Health Maintenance  There are no preventive care reminders to display for this patient.   Colorectal cancer screening: Type of screening: Colonoscopy. Completed 09/30/13. Repeat every 5-10 years  Lung Cancer Screening: (Low Dose CT Chest recommended if Age 40-80 years, 20  pack-year currently smoking OR have quit w/in 15years.) does not qualify.   Lung Cancer Screening Referral: no  Additional Screening:  Hepatitis C Screening: does not qualify; Completed 05/09/15  Vision Screening: Recommended annual ophthalmology exams for early detection of glaucoma and other disorders of the eye. Is the patient up to date with their annual eye exam?  Yes  Who is the provider or what is the name of the office in which the patient attends annual eye exams? [facility] If pt is not established with a provider, would they like to be referred to a provider to establish care? No .   Dental Screening: Recommended annual dental exams for proper oral hygiene  Diabetic Foot Exam: Diabetic Foot Exam: Completed 05/07/22  Community Resource Referral / Chronic Care Management: CRR required this visit?  No   CCM required this visit?  No     Plan:     I have personally reviewed and noted the following in the patient's chart:   Medical and social history Use of alcohol, tobacco or illicit drugs  Current medications and supplements including opioid prescriptions. Patient is not currently taking opioid prescriptions. Functional ability and status Nutritional status Physical activity Advanced directives List of other physicians Hospitalizations, surgeries, and ER visits in previous 12 months Vitals Screenings to include cognitive, depression, and falls Referrals and appointments  In addition, I have reviewed and discussed with patient certain preventive protocols, quality metrics, and best practice recommendations. A written personalized care plan for preventive services as well as general preventive health recommendations were provided to patient.    Sue Lush, LPN   [id number]   After Visit Summary: (MyChart) Due to this being a telephonic visit, the after visit summary with patients personalized plan was offered to patient via MyChart   Nurse Notes: The  patient presents with daughter in law ambulating with cane. He lives alone and relays he  was recently in the hospital. Pt speech is slurred he says due to stroke. He states he is completed PT and doing better. He has no concerns or questions at this time.

## 2023-02-04 ENCOUNTER — Other Ambulatory Visit (INDEPENDENT_AMBULATORY_CARE_PROVIDER_SITE_OTHER): Payer: Self-pay | Admitting: Vascular Surgery

## 2023-02-04 DIAGNOSIS — I6523 Occlusion and stenosis of bilateral carotid arteries: Secondary | ICD-10-CM

## 2023-02-06 ENCOUNTER — Encounter (INDEPENDENT_AMBULATORY_CARE_PROVIDER_SITE_OTHER): Payer: Self-pay | Admitting: Nurse Practitioner

## 2023-02-06 ENCOUNTER — Ambulatory Visit (INDEPENDENT_AMBULATORY_CARE_PROVIDER_SITE_OTHER): Payer: Medicare Other

## 2023-02-06 ENCOUNTER — Ambulatory Visit (INDEPENDENT_AMBULATORY_CARE_PROVIDER_SITE_OTHER): Payer: Medicare Other | Admitting: Nurse Practitioner

## 2023-02-06 VITALS — BP 132/86 | HR 87 | Resp 18 | Ht 66.0 in | Wt 170.4 lb

## 2023-02-06 DIAGNOSIS — I152 Hypertension secondary to endocrine disorders: Secondary | ICD-10-CM

## 2023-02-06 DIAGNOSIS — I6529 Occlusion and stenosis of unspecified carotid artery: Secondary | ICD-10-CM

## 2023-02-06 DIAGNOSIS — E1159 Type 2 diabetes mellitus with other circulatory complications: Secondary | ICD-10-CM

## 2023-02-06 DIAGNOSIS — I6523 Occlusion and stenosis of bilateral carotid arteries: Secondary | ICD-10-CM

## 2023-02-06 DIAGNOSIS — E119 Type 2 diabetes mellitus without complications: Secondary | ICD-10-CM

## 2023-02-10 NOTE — Progress Notes (Signed)
Subjective:    Patient ID: Gerald Bauer, male    DOB: 02/24/1946, 77 y.o.   MRN: [id number] Chief Complaint  Patient presents with   Follow-up    1 month follow up  (01/31/2023); Carotid Ultrasounds.    The patient is seen for follow up evaluation of carotid stenosis status post left carotid stent placement on 12/31/2022.  There were no post operative problems or complications related to the surgery.  The patient denies neck or incisional pain.  The patient denies interval amaurosis fugax. There is no recent history of TIA symptoms or focal motor deficits. There is no prior documented CVA.  The patient denies headache.  The patient is taking enteric-coated aspirin 81 mg daily.  No recent shortening of the patient's walking distance or new symptoms consistent with claudication.  No history of rest pain symptoms. No new ulcers or wounds of the lower extremities have occurred.  There is no history of DVT, PE or superficial thrombophlebitis. No recent episodes of angina or shortness of breath documented.   The right ICA has a 39% stenosis with no significant stenosis noted in the left.  Widely patent left ICA stent.    Review of Systems  Neurological:  Positive for speech difficulty.  All other systems reviewed and are negative.      Objective:   Physical Exam Vitals reviewed.  HENT:     Head: Normocephalic.  Neck:     Vascular: No carotid bruit.  Cardiovascular:     Rate and Rhythm: Normal rate.     Pulses:          Radial pulses are 2+ on the right side and 2+ on the left side.  Pulmonary:     Effort: Pulmonary effort is normal.  Skin:    General: Skin is warm and dry.  Neurological:     Mental Status: He is alert and oriented to person, place, and time.  Psychiatric:        Mood and Affect: Mood normal.        Behavior: Behavior normal.        Thought Content: Thought content normal.        Judgment: Judgment normal.     BP 132/86 (BP Location: Left Arm)    Pulse 87   Resp 18   Ht 5\' 6"  (1.676 m)   Wt 170 lb 6.4 oz (77.3 kg)   BMI 27.50 kg/m   Past Medical History:  Diagnosis Date   Acid reflux    Aortic atherosclerosis (HCC)    a. Noted on CT Abd in 2012.   Arthritis    hands   Carotid atherosclerosis    a. 02/2018 U/S: RICA 1-39%, LICA 40-59%; b. 09/2021 MRA Head/Neck: near complete occlusion L ICA and L MCA. Patent RICA.   Diabetes mellitus without complication (HCC)    Diastolic dysfunction    a. 09/2021 Echo: EF 55-60%, no rwma, GrI DD, nl RV fxn, RVSP 14.38mmHg.   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 09/2021 MRI/MRA Brain: Acute L MCA distribution infarct. Near complete occlusion L ICA and MCA.   Tachycardia  Tinea pedis of both feet 01/10/2017    Social History   Socioeconomic History   Marital status: Widowed    Spouse name: Venita Sheffield   Number of children: 2   Years of education: some college   Highest education level: 12th grade  Occupational History   Occupation: Retired    Comment: [employer]  Tobacco Use   Smoking status: Former    Current packs/day: 0.00    Average packs/day: 1 pack/day for 40.0 years (40.0 ttl pk-yrs)    Types: Cigarettes    Start date: 03/09/1957    Quit date: 03/09/1997    Years since quitting: 25.9   Smokeless tobacco: Former   Tobacco comments:    smoking cessation materials not required  Vaping Use   Vaping status: Never Used  Substance and Sexual Activity   Alcohol use: No    Alcohol/week: 0.0 standard drinks of alcohol   Drug use: No   Sexual activity: Never  Other Topics Concern   Not on file  Social History Narrative   Pt lives alone   Social Determinants of Health    Financial Resource Strain: Low Risk  (01/17/2023)   Overall Financial Resource Strain (CARDIA)    Difficulty of Paying Living Expenses: Not very hard  Food Insecurity: No Food Insecurity (01/17/2023)   Hunger Vital Sign    Worried About Running Out of Food in the Last Year: Never true    Ran Out of Food in the Last Year: Never true  Transportation Needs: No Transportation Needs (01/17/2023)   PRAPARE - Administrator, Civil Service (Medical): No    Lack of Transportation (Non-Medical): No  Physical Activity: Inactive (01/17/2023)   Exercise Vital Sign    Days of Exercise per Week: 0 days    Minutes of Exercise per Session: 0 min  Stress: No Stress Concern Present (01/17/2023)   [employer] of Occupational Health - Occupational Stress Questionnaire    Feeling of Stress : Not at all  Social Connections: Moderately Integrated (01/17/2023)   Social Connection and Isolation Panel [NHANES]    Frequency of Communication with Friends and Family: Three times a week    Frequency of Social Gatherings with Friends and Family: Three times a week    Attends Religious Services: More than 4 times per year    Active Member of Clubs or Organizations: No    Attends Banker Meetings: 1 to 4 times per year    Marital Status: Widowed  Intimate Partner Violence: Not At Risk (01/17/2023)   Humiliation, Afraid, Rape, and Kick questionnaire    Fear of Current or Ex-Partner: No    Emotionally Abused: No    Physically Abused: No    Sexually Abused: No    Past Surgical History:  Procedure Laterality Date   APPENDECTOMY  1973   CAROTID PTA/STENT INTERVENTION Left 12/31/2022   Procedure: CAROTID PTA/STENT INTERVENTION;  Surgeon: Annice Needy, MD;  Location: [facility] IN[facility]IVE [facility];  Service: Cardiovascular;  Laterality: Left;   CHOLECYSTECTOMY  2013   PROSTATECTOMY  2012    Family History  Problem Relation Age of Onset   Dementia Mother    High Cholesterol Mother     Hypertension Mother    Arthritis Mother    Hyperlipidemia Mother    Heart disease Father    Heart attack Father    Arthritis Maternal Grandmother    Cancer Maternal Grandmother        colon   Prostate cancer Brother  Cancer Brother        prostate   Cancer Maternal Uncle        colon   COPD Maternal Uncle    Stroke Paternal Grandmother    Diabetes Paternal Grandmother    Aneurysm Paternal Grandfather     Allergies  Allergen Reactions   Amoxicillin Swelling and Other (See Comments)   Statins Other (See Comments)    Affects the liver   Welchol [Colesevelam Hcl] Other (See Comments)    affects the liver       Latest Ref Rng & Units 01/01/2023    4:20 AM 08/08/2022   10:59 AM 02/07/2022    2:19 PM  CBC  WBC 4.0 - 10.5 K/uL 7.8  6.6  7.4   Hemoglobin 13.0 - 17.0 g/dL 09.8  11.9  14.7   Hematocrit 39.0 - 52.0 % 36.0  42.1  43.8   Platelets 150 - 400 K/uL 189  252  254       CMP     Component Value Date/Time   NA 137 01/01/2023 0420   NA 141 01/20/2019 0930   K 3.6 01/01/2023 0420   CL 110 01/01/2023 0420   CO2 23 01/01/2023 0420   GLUCOSE 162 (H) 01/01/2023 0420   BUN 12 01/01/2023 0420   BUN 11 01/20/2019 0930   CREATININE 0.71 01/01/2023 0420   CREATININE 0.80 08/08/2022 1059   CALCIUM 8.2 (L) 01/01/2023 0420   PROT 6.8 08/08/2022 1059   PROT 6.9 08/10/2019 0826   ALBUMIN 3.3 (L) 10/10/2021 0612   ALBUMIN 4.3 08/10/2019 0826   AST 11 08/08/2022 1059   ALT 12 08/08/2022 1059   ALKPHOS 50 10/10/2021 0612   BILITOT 0.5 08/08/2022 1059   BILITOT 0.6 08/10/2019 0826   EGFR 91 08/08/2022 1059   GFRNONAA >60 01/01/2023 0420   GFRNONAA 85 04/25/2020 1557     No results found.     Assessment & Plan:   1. Carotid atherosclerosis, unspecified laterality Recommend:  The patient is s/p successful left carotid stent  Duplex ultrasound  shows 1 to 39% stenosis of the right ICA with no significant stenosis of the left.  Widely patent left ICA  stent.  Continue dual antiplatelet therapy as prescribed for a minimum of 6 months Continue management of CAD, HTN and Hyperlipidemia Healthy heart diet,  encouraged exercise at least 4 times per week  The patient's NIHSS score is as follows: 4 Mild: 1 - 5 Mild to Moderately Severe: 5 - 14 Severe: 15 - 24 Very Severe: >25  Follow up in 3 months with duplex ultrasound and physical exam based on the patient's carotid surgery   2. Hypertension associated with type 2 diabetes mellitus (HCC) Continue antihypertensive medications as already ordered, these medications have been reviewed and there are no changes at this time.  3. Type 2 diabetes mellitus without complication, without long-term current use of insulin (HCC) Continue hypoglycemic medications as already ordered, these medications have been reviewed and there are no changes at this time.  Hgb A1C to be monitored as already arranged by primary service   Current Outpatient Medications on File Prior to Visit  Medication Sig Dispense Refill   acetaminophen (TYLENOL) 325 MG tablet Take 2 tablets (650 mg total) by mouth every 4 (four) hours as needed for mild pain (or temp > 37.5 C (99.5 F)).     amLODipine (NORVASC) 5 MG tablet Take 1 tablet (5 mg total) by mouth daily. 90 tablet 3  aspirin EC 81 MG tablet Take 81 mg by mouth daily. Swallow whole.     Cholecalciferol (VITAMIN D3) 25 MCG (1000 UT) CAPS Take 1,000 Units by mouth daily.     clopidogrel (PLAVIX) 75 MG tablet Take 1 tablet (75 mg total) by mouth daily. 90 tablet 3   Cyanocobalamin (VITAMIN B12) 500 MCG TABS Take 500 mcg by mouth daily.     Evolocumab (REPATHA SURECLICK) 140 MG/ML SOAJ Inject 140 mg Subcutaneously every 2 weeks. 2 mL 6   Lancets (ONETOUCH DELICA PLUS LANCET33G) MISC USE WITH METER TO CHECK BLOOD  GLUCOSE ONCE DAILY AS DIRECTED 100 each 3   metFORMIN (GLUCOPHAGE-XR) 500 MG 24 hr tablet TAKE 1 TABLET BY MOUTH TWICE  DAILY WITH MEALS 180 tablet 0   ONETOUCH  VERIO test strip CHECK FINGERSTICK BLOOD SUGARS  ONCE DAILY 100 strip 3   No current facility-administered medications on file prior to visit.    There are no Patient Instructions on file for this visit. No follow-ups on file.   Georgiana Spinner, NP

## 2023-02-22 ENCOUNTER — Other Ambulatory Visit: Payer: Self-pay | Admitting: Family Medicine

## 2023-02-28 LAB — COMPLETE METABOLIC PANEL WITH GFR
AG Ratio: 1.8 (calc) (ref 1.0–2.5)
ALT: 13 U/L (ref 9–46)
AST: 10 U/L (ref 10–35)
Albumin: 4.4 g/dL (ref 3.6–5.1)
Alkaline phosphatase (APISO): 138 U/L (ref 35–144)
BUN: 18 mg/dL (ref 7–25)
CO2: 28 mmol/L (ref 20–32)
Calcium: 9.1 mg/dL (ref 8.6–10.3)
Chloride: 101 mmol/L (ref 98–110)
Creat: 0.75 mg/dL (ref 0.70–1.28)
Globulin: 2.5 g/dL (ref 1.9–3.7)
Glucose, Bld: 181 mg/dL — ABNORMAL HIGH (ref 65–99)
Potassium: 4.2 mmol/L (ref 3.5–5.3)
Sodium: 139 mmol/L (ref 135–146)
Total Bilirubin: 0.6 mg/dL (ref 0.2–1.2)
Total Protein: 6.9 g/dL (ref 6.1–8.1)
eGFR: 92 mL/min/{1.73_m2} (ref >=60)

## 2023-02-28 LAB — HEMOGLOBIN A1C
Hgb A1c MFr Bld: 8.2 %{Hb} — ABNORMAL HIGH (ref <5.7)
Mean Plasma Glucose: 189 mg/dL
eAG (mmol/L): 10.4 mmol/L

## 2023-03-01 ENCOUNTER — Ambulatory Visit (INDEPENDENT_AMBULATORY_CARE_PROVIDER_SITE_OTHER): Payer: Medicare Other | Admitting: Physician Assistant

## 2023-03-01 ENCOUNTER — Encounter: Payer: Self-pay | Admitting: Physician Assistant

## 2023-03-01 VITALS — BP 126/74 | HR 86 | Resp 16 | Ht 67.0 in | Wt 168.0 lb

## 2023-03-01 DIAGNOSIS — Z7984 Long term (current) use of oral hypoglycemic drugs: Secondary | ICD-10-CM

## 2023-03-01 DIAGNOSIS — E1159 Type 2 diabetes mellitus with other circulatory complications: Secondary | ICD-10-CM | POA: Diagnosis not present

## 2023-03-01 DIAGNOSIS — I6522 Occlusion and stenosis of left carotid artery: Secondary | ICD-10-CM

## 2023-03-01 DIAGNOSIS — E785 Hyperlipidemia, unspecified: Secondary | ICD-10-CM

## 2023-03-01 DIAGNOSIS — E119 Type 2 diabetes mellitus without complications: Secondary | ICD-10-CM

## 2023-03-01 DIAGNOSIS — G720 Drug-induced myopathy: Secondary | ICD-10-CM

## 2023-03-01 DIAGNOSIS — T466X5D Adverse effect of antihyperlipidemic and antiarteriosclerotic drugs, subsequent encounter: Secondary | ICD-10-CM

## 2023-03-01 DIAGNOSIS — E1169 Type 2 diabetes mellitus with other specified complication: Secondary | ICD-10-CM | POA: Diagnosis not present

## 2023-03-01 DIAGNOSIS — I152 Hypertension secondary to endocrine disorders: Secondary | ICD-10-CM

## 2023-03-01 MED ORDER — METFORMIN HCL ER 500 MG PO TB24
500.0000 mg | ORAL_TABLET | Freq: Two times a day (BID) | ORAL | 0 refills | Status: DC
Start: 2023-03-01 — End: 2023-05-21

## 2023-03-01 NOTE — Progress Notes (Unsigned)
Established Patient Office Visit  Name: Gerald Bauer   MRN: [id number]    DOB: 02-16-1946   Date:03/01/2023  Today's Provider: Jacquelin Hawking, MHS, PA-C Introduced myself to the patient as a PA-C and provided education on APPs in clinical practice.         Subjective  Chief Complaint  Chief Complaint  Patient presents with   Follow-up    4 months.   Diabetes    HPI  Diabetes, Type 2 - Last A1c 8.2% - Medications: Metformin 500 mg PO QD  - Compliance: good  - Checking BG at home: no - Eye exam: UTD - Foot exam: UTD - Microalbumin: UTD - Statin: On repatha for HLD management  - PNA vaccine: completed  - Denies symptoms of hypoglycemia, polyuria, polydipsia, numbness extremities, foot ulcers/trauma  HYPERTENSION / HYPERLIPIDEMIA Satisfied with current treatment? yes Duration of hypertension: years BP monitoring frequency: a few times a week BP range: reports he gets some readings of 150s/70s - checking just after taking meds, daughter states she will help with monitoring  BP medication side effects: no  BP meds: Amlodipine 5 mg PO every day, Duration of hyperlipidemia: years Cholesterol medication side effects: no Cholesterol supplements: none  cholesterol medications: repatha 140 mg injection every 14 days  Medication compliance: good compliance Aspirin: yes Recent stressors: no Recurrent headaches: yes Visual changes: no Palpitations: no Dyspnea: no Chest pain: no Lower extremity edema: no Dizzy/lightheaded: yes- happens sometimes on standing   He reports a fall about 3 weeks ago and another one a few weeks before surgery Daughter states the one 3 weeks ago- he slid out of his chair and couldn't get back up- she denies head injury, LOC    Patient Active Problem List   Diagnosis Date Noted   Carotid stenosis, left 12/31/2022   Claustrophobia 04/19/2022   H/O prostatectomy 02/06/2022   Hemiparesis, aphasia, 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) 11/07/2021   Lower urinary tract symptoms (LUTS) 11/07/2021   Myalgia due to statin 02/21/2021   Type 2 diabetes mellitus without complication, without long-term current use of insulin (HCC) 02/21/2021   Aortic atherosclerosis (HCC) 04/27/2020   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   Onychomycosis 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) 07/19/2016   Carotid stenosis 03/13/2016   Hyperlipidemia associated with type 2 diabetes mellitus (HCC)    Hypertension associated with type 2 diabetes mellitus (HCC)    Personal history of prostate cancer    Carotid atherosclerosis    GERD (gastroesophageal reflux disease) 08/20/2013    Past Surgical History:  Procedure Laterality Date   APPENDECTOMY  1973   CAROTID PTA/STENT INTERVENTION Left 12/31/2022   Procedure: CAROTID PTA/STENT INTERVENTION;  Surgeon: Annice Needy, MD;  Location: [facility] INVASIVE CV [facility];  Service: Cardiovascular;  Laterality: Left;   CHOLECYSTECTOMY  2013   PROSTATECTOMY  2012    Family History  Problem Relation Age of Onset   Dementia Mother    High Cholesterol Mother    Hypertension Mother    Arthritis Mother    Hyperlipidemia Mother    Heart disease Father    Heart attack Father    Arthritis Maternal Grandmother    Cancer Maternal Grandmother        colon   Prostate cancer Brother    Cancer Brother        prostate   Cancer Maternal Uncle        colon  COPD Maternal Uncle    Stroke Paternal Grandmother    Diabetes Paternal Grandmother    Aneurysm Paternal Grandfather     Social History   Tobacco Use   Smoking status: Former    Current packs/day: 0.00    Average packs/day: 1 pack/day for 40.0 years (40.0 ttl pk-yrs)    Types: Cigarettes    Start date: 03/09/1957    Quit date: 03/09/1997    Years since quitting: 26.0   Smokeless tobacco: Former   Tobacco comments:    smoking cessation materials not required  Substance Use Topics   Alcohol use: No    Alcohol/week: 0.0 standard drinks of  alcohol     Current Outpatient Medications:    acetaminophen (TYLENOL) 325 MG tablet, Take 2 tablets (650 mg total) by mouth every 4 (four) hours as needed for mild pain (or temp > 37.5 C (99.5 F))., Disp: , Rfl:    amLODipine (NORVASC) 5 MG tablet, Take 1 tablet (5 mg total) by mouth daily., Disp: 90 tablet, Rfl: 3   aspirin EC 81 MG tablet, Take 81 mg by mouth daily. Swallow whole., Disp: , Rfl:    Cholecalciferol (VITAMIN D3) 25 MCG (1000 UT) CAPS, Take 1,000 Units by mouth daily., Disp: , Rfl:    clopidogrel (PLAVIX) 75 MG tablet, Take 1 tablet (75 mg total) by mouth daily., Disp: 90 tablet, Rfl: 3   Cyanocobalamin (VITAMIN B12) 500 MCG TABS, Take 500 mcg by mouth daily., Disp: , Rfl:    Evolocumab (REPATHA SURECLICK) 140 MG/ML SOAJ, Inject 140 mg Subcutaneously every 2 weeks., Disp: 2 mL, Rfl: 6   Lancets (ONETOUCH DELICA PLUS LANCET33G) MISC, USE WITH METER TO CHECK BLOOD  GLUCOSE ONCE DAILY AS DIRECTED, Disp: 100 each, Rfl: 3   ONETOUCH VERIO test strip, CHECK FINGERSTICK BLOOD SUGARS  ONCE DAILY, Disp: 100 strip, Rfl: 3   metFORMIN (GLUCOPHAGE-XR) 500 MG 24 hr tablet, Take 1 tablet (500 mg total) by mouth 2 (two) times daily with a meal., Disp: 180 tablet, Rfl: 0  Allergies  Allergen Reactions   Amoxicillin Swelling and Other (See Comments)   Statins Other (See Comments)    Affects the liver   Welchol [Colesevelam Hcl] Other (See Comments)    affects the liver    I personally reviewed active problem list, medication list, allergies, health maintenance, notes from last encounter, lab results with the patient/caregiver today.   ROS  See HPI for relevant ROS   Objective  Vitals:   03/01/23 1324  BP: 126/74  Pulse: 86  Resp: 16  SpO2: 98%  Weight: 168 lb (76.2 kg)  Height: 5\' 7"  (1.702 m)    Body mass index is 26.31 kg/m.  Physical Exam Vitals reviewed.  Constitutional:      General: He is awake.     Appearance: Normal appearance. He is well-developed and  well-groomed.  HENT:     Head: Normocephalic and atraumatic.  Cardiovascular:     Rate and Rhythm: Normal rate and regular rhythm.     Pulses: Normal pulses.          Radial pulses are 2+ on the right side and 2+ on the left side.     Heart sounds: Normal heart sounds. No murmur heard.    No friction rub. No gallop.  Pulmonary:     Effort: Pulmonary effort is normal.     Breath sounds: Normal breath sounds. No decreased air movement. No decreased breath sounds, wheezing, rhonchi or rales.  Musculoskeletal:  Cervical back: Normal range of motion.     Right lower leg: No edema.     Left lower leg: No edema.  Neurological:     General: No focal deficit present.     Mental Status: He is alert and oriented to person, place, and time. Mental status is at baseline.     GCS: GCS eye subscore is 4. GCS verbal subscore is 5. GCS motor subscore is 6.  Psychiatric:        Attention and Perception: Attention and perception normal.        Mood and Affect: Mood and affect normal.        Speech: Speech normal.        Behavior: Behavior normal. Behavior is cooperative.        Thought Content: Thought content normal.        Cognition and Memory: Cognition normal.      Recent Results (from the past 2160 hour(s))  Glucose, capillary     Status: Abnormal   Collection Time: 12/31/22  8:17 AM  Result Value Ref Range   Glucose-Capillary 192 (H) 70 - 99 mg/dL    Comment: Glucose reference range applies only to samples taken after fasting for at least 8 hours.  BUN     Status: None   Collection Time: 12/31/22  8:19 AM  Result Value Ref Range   BUN 15 8 - 23 mg/dL    Comment: Performed at [facility], [address] [place], [place] [address]  Creatinine, serum     Status: None   Collection Time: 12/31/22  8:19 AM  Result Value Ref Range   Creatinine, Ser 0.85 0.61 - 1.24 mg/dL   GFR, Estimated >40 >98 mL/min    Comment: (NOTE) Calculated using the CKD-EPI Creatinine Equation  (2021) Performed at [facility], [address] [place], [place] [address]   POCT Activated clotting time     Status: None   Collection Time: 12/31/22 10:13 AM  Result Value Ref Range   Activated Clotting Time 299 seconds    Comment: Reference range 74-137 seconds for patients not on anticoagulant therapy.  Glucose, capillary     Status: Abnormal   Collection Time: 12/31/22 10:58 AM  Result Value Ref Range   Glucose-Capillary 216 (H) 70 - 99 mg/dL    Comment: Glucose reference range applies only to samples taken after fasting for at least 8 hours.  MRSA Next Gen by PCR, Nasal     Status: None   Collection Time: 12/31/22 11:49 AM   Specimen: Nasal Mucosa; Nasal Swab  Result Value Ref Range   MRSA by PCR Next Gen NOT DETECTED NOT DETECTED    Comment: (NOTE) The GeneXpert MRSA Assay (FDA approved for NASAL specimens only), is one component of a comprehensive MRSA colonization surveillance program. It is not intended to diagnose MRSA infection nor to guide or monitor treatment for MRSA infections. Test performance is not FDA approved in patients less than 18 years old. Performed at [facility], [address] [place], [place] [address]   CBC     Status: Abnormal   Collection Time: 01/01/23  4:20 AM  Result Value Ref Range   WBC 7.8 4.0 - 10.5 K/uL   RBC 4.05 (L) 4.22 - 5.81 MIL/uL   Hemoglobin 12.5 (L) 13.0 - 17.0 g/dL   HCT 62.1 (L) 30.8 - 65.7 %   MCV 88.9 80.0 - 100.0 fL   MCH 30.9 26.0 - 34.0 pg  MCHC 34.7 30.0 - 36.0 g/dL   RDW 16.1 09.6 - 04.5 %   Platelets 189 150 - 400 K/uL   nRBC 0.0 0.0 - 0.2 %    Comment: Performed at [facility], [address] [place], [place] [address]  Basic metabolic panel     Status: Abnormal   Collection Time: 01/01/23  4:20 AM  Result Value Ref Range   Sodium 137 135 - 145 mmol/L   Potassium 3.6 3.5 - 5.1 mmol/L   Chloride 110 98 - 111 mmol/L   CO2 23 22 - 32 mmol/L   Glucose, Bld 162 (H) 70 - 99  mg/dL    Comment: Glucose reference range applies only to samples taken after fasting for at least 8 hours.   BUN 12 8 - 23 mg/dL   Creatinine, Ser 1.91 0.61 - 1.24 mg/dL   Calcium 8.2 (L) 8.9 - 10.3 mg/dL   GFR, Estimated >47 >82 mL/min    Comment: (NOTE) Calculated using the CKD-EPI Creatinine Equation (2021)    Anion gap 4 (L) 5 - 15    Comment: Performed at [facility], [address] [place], [place] [address]  COMPLETE METABOLIC PANEL WITH GFR     Status: Abnormal   Collection Time: 02/27/23  8:09 AM  Result Value Ref Range   Glucose, Bld 181 (H) 65 - 99 mg/dL    Comment: .            Fasting reference interval . For someone without known diabetes, a glucose value >125 mg/dL indicates that they may have diabetes and this should be confirmed with a follow-up test. .    BUN 18 7 - 25 mg/dL   Creat 3.08 6.57 - 8.46 mg/dL   eGFR 92 > OR = 60 NG/EXB/2.84X3   BUN/Creatinine Ratio SEE NOTE: 6 - 22 (calc)    Comment:    Not Reported: BUN and Creatinine are within    reference range. .    Sodium 139 135 - 146 mmol/L   Potassium 4.2 3.5 - 5.3 mmol/L   Chloride 101 98 - 110 mmol/L   CO2 28 20 - 32 mmol/L   Calcium 9.1 8.6 - 10.3 mg/dL   Total Protein 6.9 6.1 - 8.1 g/dL   Albumin 4.4 3.6 - 5.1 g/dL   Globulin 2.5 1.9 - 3.7 g/dL (calc)   AG Ratio 1.8 1.0 - 2.5 (calc)   Total Bilirubin 0.6 0.2 - 1.2 mg/dL   Alkaline phosphatase (APISO) 138 35 - 144 U/L   AST 10 10 - 35 U/L   ALT 13 9 - 46 U/L  Hemoglobin A1c     Status: Abnormal   Collection Time: 02/27/23  8:09 AM  Result Value Ref Range   Hgb A1c MFr Bld 8.2 (H) <5.7 % of total Hgb    Comment: For someone without known diabetes, a hemoglobin A1c value of 6.5% or greater indicates that they may have  diabetes and this should be confirmed with a follow-up  test. . For someone with known diabetes, a value <7% indicates  that their diabetes is well controlled and a value  greater than or equal to 7% indicates  suboptimal  control. A1c targets should be individualized based on  duration of diabetes, age, comorbid conditions, and  other considerations. . Currently, no consensus exists regarding use of hemoglobin A1c for diagnosis of diabetes for children. .    Mean Plasma Glucose 189 mg/dL   eAG (mmol/L) 24.4 mmol/L  PHQ2/9:    03/01/2023    1:28 PM 01/17/2023    2:47 PM 10/30/2022   10:38 AM 08/08/2022   10:27 AM 02/07/2022    1:27 PM  Depression screen PHQ 2/9  Decreased Interest 0 1 2 0 1  Down, Depressed, Hopeless 0 1 0 0 0  PHQ - 2 Score 0 2 2 0 1  Altered sleeping 2 0 0 0 0  Tired, decreased energy 2 2 0 0 0  Change in appetite 0 0 0 0 0  Feeling bad or failure about yourself  0 0 0 0 0  Trouble concentrating 0 0 0 0 0  Moving slowly or fidgety/restless 0 0 0 0 0  Suicidal thoughts 0 0 0 0 0  PHQ-9 Score 4 4 2  0 1  Difficult doing work/chores  Not difficult at all Not difficult at all Not difficult at all Not difficult at all      Fall Risk:    03/01/2023    1:24 PM 01/17/2023    2:42 PM 10/30/2022   10:38 AM 08/08/2022   10:27 AM 02/07/2022    1:26 PM  Fall Risk   Falls in the past year? 1 1 1 1 1   Number falls in past yr: 1 1 1 1 1   Injury with Fall? 1 1 0 1 1  Risk for fall due to : Impaired balance/gait;Impaired mobility Impaired mobility;Impaired balance/gait;History of fall(s) Impaired balance/gait  Impaired balance/gait  Follow up Falls prevention discussed Education provided;Falls prevention discussed Falls prevention discussed;Education provided;Falls evaluation completed  Falls prevention discussed;Education provided;Falls evaluation completed      Functional Status Survey: Is the patient deaf or have difficulty hearing?: Yes Does the patient have difficulty seeing, even when wearing glasses/contacts?: No Does the patient have difficulty concentrating, remembering, or making decisions?: Yes Does the patient have difficulty walking or climbing stairs?:  Yes Does the patient have difficulty dressing or bathing?: No Does the patient have difficulty doing errands alone such as visiting a doctor's office or shopping?: Yes    Assessment & Plan  Problem List Items Addressed This Visit       Cardiovascular and Mediastinum   Hypertension associated with type 2 diabetes mellitus (HCC)    Chronic, historic condition BP appears well-controlled in office today He reports he is getting some readings in the 150s/ 70s at home.  He reports that this is usually after he takes his medications first thing in the morning.  His daughter reports that she will assist with monitoring.  Recommended they keep a log of blood pressure results and bring to the next appointment for review He is currently taking amlodipine 5 mg p.o. daily and appears to be tolerating well Continue current regimen Follow-up in 3 months or sooner if concerns arise      Relevant Medications   metFORMIN (GLUCOPHAGE-XR) 500 MG 24 hr tablet   Carotid stenosis, left    Underwent Carotid stent placement on 12/31/22 Appears to be patent and it is recommended he remain on dual antiplatelet therapy for minimum of 6 months per office visit with Vascular on 02/06/23  Continue aspirin and Plavix as directed by cardiology Continue follow-up with cardiology          Endocrine   Hyperlipidemia associated with type 2 diabetes mellitus (HCC) - Primary    Chronic, historic condition Currently taking Repatha 140 mg injection every 14 days Continue current regimen Currently seeing cardiology for collaborative management Recheck lipid panel at follow-up Recommend  follow-up in about 3 months or sooner if concerns arise       Relevant Medications   metFORMIN (GLUCOPHAGE-XR) 500 MG 24 hr tablet   Type 2 diabetes mellitus without complication, without long-term current use of insulin (HCC)    Chronic, historic condition Most recent A1c was 8.2%. He is currently taking metformin 500 mg p.o.  daily and appears to be tolerating well Given increase in A1c recommend that he starts taking this twice per day to provide adequate control He is not checking blood sugars at home but denies symptoms of hypoglycemia. Follow-up in 3 months or sooner if concerns arise      Relevant Medications   metFORMIN (GLUCOPHAGE-XR) 500 MG 24 hr tablet     Musculoskeletal and Integument   Statin myopathy    Currently taking repatha for management of HLD per Cardiology          Return in about 3 months (around 05/30/2023) for DM, HLD, fasting labs.   I, Algie Cales E Glorine Hanratty, PA-C, have reviewed all documentation for this visit. The documentation on 03/05/23 for the exam, diagnosis, procedures, and orders are all accurate and complete.   Jacquelin Hawking, MHS, PA-C [facility]

## 2023-03-01 NOTE — Assessment & Plan Note (Signed)
Currently taking repatha for management of HLD per Cardiology

## 2023-03-01 NOTE — Assessment & Plan Note (Addendum)
Underwent Carotid stent placement on 12/31/22 Appears to be patent and it is recommended he remain on dual antiplatelet therapy for minimum of 6 months per office visit with Vascular on 02/06/23  Continue aspirin and Plavix as directed by cardiology Continue follow-up with cardiology

## 2023-03-05 NOTE — Assessment & Plan Note (Signed)
Chronic, historic condition BP appears well-controlled in office today He reports he is getting some readings in the 150s/ 70s at home.  He reports that this is usually after he takes his medications first thing in the morning.  His daughter reports that she will assist with monitoring.  Recommended they keep a log of blood pressure results and bring to the next appointment for review He is currently taking amlodipine 5 mg p.o. daily and appears to be tolerating well Continue current regimen Follow-up in 3 months or sooner if concerns arise

## 2023-03-05 NOTE — Assessment & Plan Note (Signed)
Chronic, historic condition Most recent A1c was 8.2%. He is currently taking metformin 500 mg p.o. daily and appears to be tolerating well Given increase in A1c recommend that he starts taking this twice per day to provide adequate control He is not checking blood sugars at home but denies symptoms of hypoglycemia. Follow-up in 3 months or sooner if concerns arise

## 2023-03-05 NOTE — Assessment & Plan Note (Signed)
Chronic, historic condition Currently taking Repatha 140 mg injection every 14 days Continue current regimen Currently seeing cardiology for collaborative management Recheck lipid panel at follow-up Recommend follow-up in about 3 months or sooner if concerns arise

## 2023-03-22 LAB — HM DIABETES EYE EXAM

## 2023-04-04 ENCOUNTER — Ambulatory Visit: Payer: Medicare Other | Attending: Cardiovascular Disease | Admitting: Cardiovascular Disease

## 2023-04-04 ENCOUNTER — Encounter: Payer: Self-pay | Admitting: Cardiovascular Disease

## 2023-04-04 VITALS — BP 122/80 | HR 110 | Ht 67.0 in | Wt 170.2 lb

## 2023-04-04 DIAGNOSIS — I700 Atherosclerosis of aorta: Secondary | ICD-10-CM

## 2023-04-04 DIAGNOSIS — E785 Hyperlipidemia, unspecified: Secondary | ICD-10-CM

## 2023-04-04 DIAGNOSIS — I69320 Aphasia following cerebral infarction: Secondary | ICD-10-CM

## 2023-04-04 DIAGNOSIS — I10 Essential (primary) hypertension: Secondary | ICD-10-CM

## 2023-04-04 DIAGNOSIS — I69391 Dysphagia following cerebral infarction: Secondary | ICD-10-CM

## 2023-04-04 DIAGNOSIS — E1159 Type 2 diabetes mellitus with other circulatory complications: Secondary | ICD-10-CM | POA: Diagnosis not present

## 2023-04-04 DIAGNOSIS — I69359 Hemiplegia and hemiparesis following cerebral infarction affecting unspecified side: Secondary | ICD-10-CM

## 2023-04-04 DIAGNOSIS — Z7902 Long term (current) use of antithrombotics/antiplatelets: Secondary | ICD-10-CM

## 2023-04-04 DIAGNOSIS — I6529 Occlusion and stenosis of unspecified carotid artery: Secondary | ICD-10-CM

## 2023-04-04 DIAGNOSIS — I152 Hypertension secondary to endocrine disorders: Secondary | ICD-10-CM

## 2023-04-04 DIAGNOSIS — I6522 Occlusion and stenosis of left carotid artery: Secondary | ICD-10-CM

## 2023-04-04 DIAGNOSIS — Z09 Encounter for follow-up examination after completed treatment for conditions other than malignant neoplasm: Secondary | ICD-10-CM

## 2023-04-04 MED ORDER — REPATHA SURECLICK 140 MG/ML ~~LOC~~ SOAJ: SUBCUTANEOUS | 6 refills | Status: DC

## 2023-04-04 MED ORDER — AMLODIPINE BESYLATE 5 MG PO TABS: 5.0000 mg | ORAL_TABLET | Freq: Every day | ORAL | 3 refills | Status: DC

## 2023-04-04 MED ORDER — CLOPIDOGREL BISULFATE 75 MG PO TABS: 75.0000 mg | ORAL_TABLET | Freq: Every day | ORAL | 3 refills | Status: DC

## 2023-04-04 NOTE — Progress Notes (Signed)
 Date:  04/04/2023   ID:  Gerald Bauer, DOB May 17, 1945, MRN [id number]  Patient Location:  [address] [place] [address]   Provider location:   [facility], [place] office  PCP:  Leavy Mole, PA-C  Cardiologist:  Stevana Dufner, [facility]   Chief Complaint  Patient presents with   12 month follow up     Patient c/o shortness of breath with exertion.     History of Present Illness:    Gerald Bauer is a 78 y.o. male past medical history of HTN Syncope Palpitations Former smoke, 40 yrs Carotid stenosis, 100% on the left CT ABD aortic: Calcification in the proximal LAD, moderate aortic atherosclerosis noted Who presents for discussion of his hyperlipidemia  Last seen by myself in clinic April 2024 Stress test January 2024, no significant ischemia  Presents today with his daughter She reports that he is sedentary, Prior hobbies include making model trains, he has not been doing this recently No physical therapy, no exercise program Getting some leg weakness  Chronic mild shortness of breath on exertion, denies chest pain concerning for angina  Continues to struggle with residual deficits from stroke, aphasia, weakness on right Remains on aspirin , Plavix , Repatha  Lab work reviewed A1c 6.4, up to 7.6 up to 8.2 Total cholesterol 125, LDL 55  EKG Interpretation Date/Time:  Thursday April 04 2023 14:26:34 EST Ventricular Rate:  110 PR Interval:  150 QRS Duration:  80 QT Interval:  340 QTC Calculation: 460 R Axis:   -32  Text Interpretation: Sinus tachycardia Left axis deviation Cannot rule out Anterior infarct , age undetermined When compared with ECG of 31-Dec-2022 09:23, No significant change was found Confirmed by Perla Lye [phone]) on 04/04/2023 2:59:16 PM   Prior records reviewed  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   echocardiogram which showed ejection fraction about 50%. Holter monitor was also unremarkable and regular stress test she was able to exercise 6 min and reached a heart rate of 150 and was unremarkable.   Past Medical History:  Diagnosis Date   Acid reflux    Aortic atherosclerosis (HCC)    a. Noted on CT Abd in 2012.   Arthritis    hands   Carotid atherosclerosis    a. 02/2018 U/S: RICA 1-39%, LICA 40-59%; b. 09/2021 MRA Head/Neck: near complete occlusion L ICA and L MCA. Patent RICA.   Diabetes mellitus without complication (HCC)    Diastolic dysfunction    a. 09/2021 Echo: EF 55-60%, no rwma, GrI DD, nl RV fxn, RVSP 14.76mmHg.   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 09/2021 MRI/MRA Brain: Acute L MCA distribution infarct. Near complete occlusion L ICA and MCA.   Tachycardia    Tinea pedis of both feet 01/10/2017   Past Surgical History:  Procedure Laterality Date   APPENDECTOMY  1973   CAROTID PTA/STENT INTERVENTION Left 12/31/2022   Procedure: CAROTID PTA/STENT INTERVENTION;  Surgeon: Marea Selinda RAMAN, MD;  Location: [facility] INVASIVE CV LAB;  Service: Cardiovascular;  Laterality: Left;   CHOLECYSTECTOMY  2013   PROSTATECTOMY  2012     Allergies:   Amoxicillin, Statins, and Welchol [colesevelam hcl]   Social History   Tobacco Use   Smoking status: Former    Current packs/day: 0.00    Average packs/day: 1 pack/day for 40.0 years (40.0 ttl pk-yrs)    Types: Cigarettes    Start date:  03/09/1957    Quit date: 03/09/1997    Years since quitting: 26.0   Smokeless tobacco: Former   Tobacco comments:    smoking cessation materials not required  Vaping Use   Vaping status: Never Used  Substance Use Topics   Alcohol use: No    Alcohol/week: 0.0 standard drinks of alcohol   Drug use: No     Current Outpatient Medications on File Prior to Visit  Medication Sig Dispense Refill   acetaminophen  (TYLENOL ) 325 MG tablet Take 2 tablets (650 mg total) by mouth every 4 (four) hours as needed for mild pain (or temp > 37.5 C (99.5 F)).     amLODipine  (NORVASC ) 5 MG tablet Take 1 tablet (5 mg total) by mouth daily. 90 tablet 3   aspirin  EC 81 MG tablet Take 81 mg by mouth daily. Swallow whole.     Cholecalciferol  (VITAMIN D3) 25 MCG (1000 UT) CAPS Take 1,000 Units by mouth daily.     clopidogrel  (PLAVIX ) 75 MG tablet Take 1 tablet (75 mg total) by mouth daily. 90 tablet 3   Cyanocobalamin  (VITAMIN B12) 500 MCG TABS Take 500 mcg by mouth daily.     Evolocumab  (REPATHA  SURECLICK) 140 MG/ML SOAJ Inject 140 mg Subcutaneously every 2 weeks. 2 mL 6   metFORMIN  (GLUCOPHAGE -XR) 500 MG 24 hr tablet Take 1 tablet (500 mg total) by mouth 2 (two) times daily with a meal. 180 tablet 0   ONETOUCH VERIO test strip CHECK FINGERSTICK BLOOD SUGARS  ONCE DAILY 100 strip 3   Lancets (ONETOUCH DELICA PLUS LANCET33G) MISC USE WITH METER TO CHECK BLOOD  GLUCOSE ONCE DAILY AS DIRECTED (Patient not taking: Reported on 04/04/2023) 100 each 3   No current facility-administered medications on file prior to visit.     Family Hx: The patient's family history includes Aneurysm in his paternal grandfather; Arthritis in his maternal grandmother and mother; COPD in his maternal uncle; Cancer in his brother, maternal grandmother, and maternal uncle; Dementia in his mother; Diabetes in his paternal grandmother; Heart attack in his father; Heart disease in his father; High Cholesterol in his mother; Hyperlipidemia in his  mother; Hypertension in his mother; Prostate cancer in his brother; Stroke in his paternal grandmother.  ROS:   Please see the history of present illness.    Review of Systems  Constitutional: Negative.   HENT: Negative.    Respiratory: Negative.    Cardiovascular: Negative.   Gastrointestinal: Negative.   Musculoskeletal: Negative.   Neurological: Negative.   Psychiatric/Behavioral: Negative.    All other systems reviewed and are negative.    Labs/Other Tests and Data Reviewed:    Recent Labs: 04/12/2022: TSH 2.268 01/01/2023: Hemoglobin 12.5; Platelets 189 02/27/2023: ALT 13; BUN 18; Creat 0.75; Potassium 4.2; Sodium 139   Recent Lipid Panel Lab Results  Component Value Date/Time   CHOL 125 08/08/2022 10:59 AM  CHOL 122 08/10/2019 08:26 AM   TRIG 140 08/08/2022 10:59 AM   HDL 48 08/08/2022 10:59 AM   HDL 48 08/10/2019 08:26 AM   CHOLHDL 2.6 08/08/2022 10:59 AM   LDLCALC 55 08/08/2022 10:59 AM    Wt Readings from Last 3 Encounters:  04/04/23 170 lb 4 oz (77.2 kg)  03/01/23 168 lb (76.2 kg)  02/06/23 170 lb 6.4 oz (77.3 kg)     Exam:    Vital Signs: Vital signs may also be detailed in the HPI BP 122/80 (BP Location: Left Arm, Patient Position: Sitting, Cuff Size: Normal)   Pulse (!) 110   Ht 5' 7 (1.702 m)   Wt 170 lb 4 oz (77.2 kg)   SpO2 98%   BMI 26.66 kg/m   Constitutional:  oriented to person, place, and time. No distress.  HENT:  Head: Grossly normal Eyes:  no discharge. No scleral icterus.  Neck: No JVD, no carotid bruits  Cardiovascular: Regular rate and rhythm, no murmurs appreciated Pulmonary/Chest: Clear to auscultation bilaterally, no wheezes or rails Abdominal: Soft.  no distension.  no tenderness.  Musculoskeletal: Normal range of motion Neurological:  normal muscle tone. Coordination normal. 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)   Other Visit Diagnoses       Essential hypertension       Relevant Orders   EKG 12-Lead (Completed)     Hyperlipidemia, unspecified hyperlipidemia type         Occlusion of left internal carotid artery       Relevant Orders   EKG 12-Lead (Completed)      PAD/aortic atherosclerosis/carotid stenosis on left stroke July 2023, occluded left carotid, deficits on the right Moderate aortic atherosclerosis Prior smoker,  Intolerant of statins Tolerating Repatha , cholesterol well-controlled Stable mild carotid disease  Coronary calcification moderate, seen on CT scan 2012 Particularly proximal LAD, proximal RCA Recent stress test  low risk,  Denies anginal symptoms, cholesterol at goal  Carotid stenosis 40 to 59% on the left in 2019 Occluded with stroke symptoms July 2023 On aspirin  Plavix , Repatha   Hyperlipidemia Statin intolerant on Repatha , cholesterol well-controlled  Diabetes type 2 A1c reasonable trending higher, Long discussion concerning diet, need for calorie restriction  Shortness of breath 40 years of smoking Recommend regular walking program   Signed, Evalene Lunger, MD  [facility] [address], [place], [place] [address]

## 2023-04-04 NOTE — Patient Instructions (Addendum)
 Medication Instructions:  Your physician recommends that you continue on your current medications as directed. Please refer to the Current Medication list given to you today.  If you need a refill on your cardiac medications before your next appointment, please call your pharmacy.   Lab work: No new labs needed  Testing/Procedures: No new testing needed  Follow-Up: At [facility], you and your health needs are our priority.  As part of our continuing mission to provide you with exceptional heart care, we have created designated Provider Care Teams.  These Care Teams include your primary Cardiologist (physician) and Advanced Practice Providers (APPs -  Physician Assistants and Nurse Practitioners) who all work together to provide you with the care you need, when you need it.  You will need a follow up appointment in 12 months  Providers on your designated Care Team:   Nicolasa Ducking, NP Eula Listen, PA-C Cadence Fransico Michael, [place]  COVID-19 Vaccine Information can be found at: [email or web address] For questions related to vaccine distribution or appointments, please email [email or web address] or call [phone].

## 2023-04-15 ENCOUNTER — Ambulatory Visit: Payer: Medicare Other | Admitting: Cardiovascular Disease

## 2023-05-08 ENCOUNTER — Other Ambulatory Visit (INDEPENDENT_AMBULATORY_CARE_PROVIDER_SITE_OTHER): Payer: Self-pay | Admitting: Nurse Practitioner

## 2023-05-08 DIAGNOSIS — I6523 Occlusion and stenosis of bilateral carotid arteries: Secondary | ICD-10-CM

## 2023-05-09 ENCOUNTER — Ambulatory Visit (INDEPENDENT_AMBULATORY_CARE_PROVIDER_SITE_OTHER): Payer: Medicare Other

## 2023-05-09 ENCOUNTER — Encounter (INDEPENDENT_AMBULATORY_CARE_PROVIDER_SITE_OTHER): Payer: Self-pay | Admitting: Nurse Practitioner

## 2023-05-09 ENCOUNTER — Ambulatory Visit (INDEPENDENT_AMBULATORY_CARE_PROVIDER_SITE_OTHER): Payer: Medicare Other | Admitting: Nurse Practitioner

## 2023-05-09 VITALS — BP 134/84 | HR 111 | Resp 18 | Ht 67.0 in | Wt 164.6 lb

## 2023-05-09 DIAGNOSIS — E119 Type 2 diabetes mellitus without complications: Secondary | ICD-10-CM

## 2023-05-09 DIAGNOSIS — E1159 Type 2 diabetes mellitus with other circulatory complications: Secondary | ICD-10-CM | POA: Diagnosis not present

## 2023-05-09 DIAGNOSIS — I6523 Occlusion and stenosis of bilateral carotid arteries: Secondary | ICD-10-CM | POA: Diagnosis not present

## 2023-05-09 DIAGNOSIS — I152 Hypertension secondary to endocrine disorders: Secondary | ICD-10-CM

## 2023-05-09 DIAGNOSIS — I6529 Occlusion and stenosis of unspecified carotid artery: Secondary | ICD-10-CM | POA: Diagnosis not present

## 2023-05-12 NOTE — Progress Notes (Signed)
Subjective:    Patient ID: Gerald Bauer, male    DOB: 05-Dec-1945, 78 y.o.   MRN: [id number] Chief Complaint  Patient presents with   Follow-up    fu 3 months + Carotid    The patient is seen for follow up evaluation of carotid stenosis status post left carotid stent placement on 12/31/2022.  There were no post operative problems or complications related to the surgery.  The patient denies neck or incisional pain.  The patient denies interval amaurosis fugax. There is no recent history of TIA symptoms or focal motor deficits. There is no prior documented CVA.  The patient denies headache.  The patient is taking enteric-coated aspirin 81 mg daily.  No recent shortening of the patient's walking distance or new symptoms consistent with claudication.  No history of rest pain symptoms. No new ulcers or wounds of the lower extremities have occurred.  There is no history of DVT, PE or superficial thrombophlebitis. No recent episodes of angina or shortness of breath documented.   The right ICA has a 1 to 39% stenosis with no significant stenosis noted bilaterally.  Widely patent left ICA stent.    Review of Systems  Neurological:  Positive for speech difficulty.  All other systems reviewed and are negative.      Objective:   Physical Exam Vitals reviewed.  HENT:     Head: Normocephalic.  Neck:     Vascular: No carotid bruit.  Cardiovascular:     Rate and Rhythm: Normal rate.     Pulses:          Radial pulses are 2+ on the right side and 2+ on the left side.  Pulmonary:     Effort: Pulmonary effort is normal.  Skin:    General: Skin is warm and dry.  Neurological:     Mental Status: He is alert and oriented to person, place, and time.  Psychiatric:        Mood and Affect: Mood normal.        Behavior: Behavior normal.        Thought Content: Thought content normal.        Judgment: Judgment normal.     BP 134/84   Pulse (!) 111   Resp 18   Ht 5\' 7"  (1.702 m)    Wt 164 lb 9.6 oz (74.7 kg)   BMI 25.78 kg/m   Past Medical History:  Diagnosis Date   Acid reflux    Aortic atherosclerosis (HCC)    a. Noted on CT Abd in 2012.   Arthritis    hands   Carotid atherosclerosis    a. 02/2018 U/S: RICA 1-39%, LICA 40-59%; b. 09/2021 MRA Head/Neck: near complete occlusion L ICA and L MCA. Patent RICA.   Diabetes mellitus without complication (HCC)    Diastolic dysfunction    a. 09/2021 Echo: EF 55-60%, no rwma, GrI DD, nl RV fxn, RVSP 14.23mmHg.   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 09/2021 MRI/MRA Brain: Acute L MCA distribution infarct. Near complete occlusion L ICA and MCA.   Tachycardia    Tinea pedis of both feet  01/10/2017    Social History   Socioeconomic History   Marital status: Widowed    Spouse name: Venita Sheffield   Number of children: 2   Years of education: some college   Highest education level: 12th grade  Occupational History   Occupation: Retired    Comment: [employer]  Tobacco Use   Smoking status: Former    Current packs/day: 0.00    Average packs/day: 1 pack/day for 40.0 years (40.0 ttl pk-yrs)    Types: Cigarettes    Start date: 03/09/1957    Quit date: 03/09/1997    Years since quitting: 26.1   Smokeless tobacco: Former   Tobacco comments:    smoking cessation materials not required  Vaping Use   Vaping status: Never Used  Substance and Sexual Activity   Alcohol use: No    Alcohol/week: 0.0 standard drinks of alcohol   Drug use: No   Sexual activity: Never  Other Topics Concern   Not on file  Social History Narrative   Pt lives alone   Social Drivers of Health   Financial Resource Strain: Low Risk  (01/17/2023)    Overall Financial Resource Strain (CARDIA)    Difficulty of Paying Living Expenses: Not very hard  Food Insecurity: No Food Insecurity (01/17/2023)   Hunger Vital Sign    Worried About Running Out of Food in the Last Year: Never true    Ran Out of Food in the Last Year: Never true  Transportation Needs: No Transportation Needs (01/17/2023)   PRAPARE - Administrator, Civil Service (Medical): No    Lack of Transportation (Non-Medical): No  Physical Activity: Inactive (01/17/2023)   Exercise Vital Sign    Days of Exercise per Week: 0 days    Minutes of Exercise per Session: 0 min  Stress: No Stress Concern Present (01/17/2023)   [employer] of Occupational Health - Occupational Stress Questionnaire    Feeling of Stress : Not at all  Social Connections: Moderately Integrated (01/17/2023)   Social Connection and Isolation Panel [NHANES]    Frequency of Communication with Friends and Family: Three times a week    Frequency of Social Gatherings with Friends and Family: Three times a week    Attends Religious Services: More than 4 times per year    Active Member of Clubs or Organizations: No    Attends Banker Meetings: 1 to 4 times per year    Marital Status: Widowed  Intimate Partner Violence: Not At Risk (01/17/2023)   Humiliation, Afraid, Rape, and Kick questionnaire    Fear of Current or Ex-Partner: No    Emotionally Abused: No    Physically Abused: No    Sexually Abused: No    Past Surgical History:  Procedure Laterality Date   APPENDECTOMY  1973   CAROTID PTA/STENT INTERVENTION Left 12/31/2022   Procedure: CAROTID PTA/STENT INTERVENTION;  Surgeon: Annice Needy, MD;  Location: [facility] INVASIVE CV LAB;  Service: Cardiovascular;  Laterality: Left;   CHOLECYSTECTOMY  2013   PROSTATECTOMY  2012    Family History  Problem Relation Age of Onset   Dementia Mother    High Cholesterol Mother    Hypertension Mother    Arthritis Mother    Hyperlipidemia  Mother    Heart disease Father    Heart attack Father    Arthritis Maternal Grandmother    Cancer Maternal Grandmother        colon   Prostate cancer Brother    Cancer Brother  prostate   Cancer Maternal Uncle        colon   COPD Maternal Uncle    Stroke Paternal Grandmother    Diabetes Paternal Grandmother    Aneurysm Paternal Grandfather     Allergies  Allergen Reactions   Amoxicillin Swelling and Other (See Comments)   Statins Other (See Comments)    Affects the liver   Welchol [Colesevelam Hcl] Other (See Comments)    affects the liver       Latest Ref Rng & Units 01/01/2023    4:20 AM 08/08/2022   10:59 AM 02/07/2022    2:19 PM  CBC  WBC 4.0 - 10.5 K/uL 7.8  6.6  7.4   Hemoglobin 13.0 - 17.0 g/dL 78.2  95.6  21.3   Hematocrit 39.0 - 52.0 % 36.0  42.1  43.8   Platelets 150 - 400 K/uL 189  252  254       CMP     Component Value Date/Time   NA 139 02/27/2023 0809   NA 141 01/20/2019 0930   K 4.2 02/27/2023 0809   CL 101 02/27/2023 0809   CO2 28 02/27/2023 0809   GLUCOSE 181 (H) 02/27/2023 0809   BUN 18 02/27/2023 0809   BUN 11 01/20/2019 0930   CREATININE 0.75 02/27/2023 0809   CALCIUM 9.1 02/27/2023 0809   PROT 6.9 02/27/2023 0809   PROT 6.9 08/10/2019 0826   ALBUMIN 3.3 (L) 10/10/2021 0612   ALBUMIN 4.3 08/10/2019 0826   AST 10 02/27/2023 0809   ALT 13 02/27/2023 0809   ALKPHOS 50 10/10/2021 0612   BILITOT 0.6 02/27/2023 0809   BILITOT 0.6 08/10/2019 0826   EGFR 92 02/27/2023 0809   GFRNONAA >60 01/01/2023 0420   GFRNONAA 85 04/25/2020 1557     No results found.     Assessment & Plan:   1. Carotid atherosclerosis, unspecified laterality Recommend:  Given the patient's asymptomatic subcritical stenosis no further invasive testing or surgery at this time.  Duplex ultrasound shows 1-39% stenosis bilaterally.  Continue antiplatelet therapy as prescribed Continue management of CAD, HTN and Hyperlipidemia Healthy heart diet,   encouraged exercise at least 4 times per week  Follow up in 6 months with duplex ultrasound and physical exam   2. Hypertension associated with type 2 diabetes mellitus (HCC) Continue antihypertensive medications as already ordered, these medications have been reviewed and there are no changes at this time.  3. Type 2 diabetes mellitus without complication, without long-term current use of insulin (HCC) Continue hypoglycemic medications as already ordered, these medications have been reviewed and there are no changes at this time.  Hgb A1C to be monitored as already arranged by primary service   Current Outpatient Medications on File Prior to Visit  Medication Sig Dispense Refill   acetaminophen (TYLENOL) 325 MG tablet Take 2 tablets (650 mg total) by mouth every 4 (four) hours as needed for mild pain (or temp > 37.5 C (99.5 F)).     amLODipine (NORVASC) 5 MG tablet Take 1 tablet (5 mg total) by mouth daily. 90 tablet 3   aspirin EC 81 MG tablet Take 81 mg by mouth daily. Swallow whole.     Cholecalciferol (VITAMIN D3) 25 MCG (1000 UT) CAPS Take 1,000 Units by mouth daily.     clopidogrel (PLAVIX) 75 MG tablet Take 1 tablet (75 mg total) by mouth daily. 90 tablet 3   Cyanocobalamin (VITAMIN B12) 500 MCG TABS Take 500 mcg by mouth daily.  Evolocumab (REPATHA SURECLICK) 140 MG/ML SOAJ Inject 140 mg Subcutaneously every 2 weeks. 2 mL 6   Lancets (ONETOUCH DELICA PLUS LANCET33G) MISC USE WITH METER TO CHECK BLOOD  GLUCOSE ONCE DAILY AS DIRECTED 100 each 3   metFORMIN (GLUCOPHAGE-XR) 500 MG 24 hr tablet Take 1 tablet (500 mg total) by mouth 2 (two) times daily with a meal. 180 tablet 0   ONETOUCH VERIO test strip CHECK FINGERSTICK BLOOD SUGARS  ONCE DAILY 100 strip 3   No current facility-administered medications on file prior to visit.    There are no Patient Instructions on file for this visit. No follow-ups on file.   Georgiana Spinner, NP

## 2023-05-18 ENCOUNTER — Other Ambulatory Visit: Payer: Self-pay | Admitting: Physician Assistant

## 2023-05-18 DIAGNOSIS — E119 Type 2 diabetes mellitus without complications: Secondary | ICD-10-CM

## 2023-05-20 ENCOUNTER — Encounter: Payer: Self-pay | Admitting: Family Medicine

## 2023-05-21 NOTE — Telephone Encounter (Signed)
 Requested Prescriptions  Pending Prescriptions Disp Refills   metFORMIN (GLUCOPHAGE-XR) 500 MG 24 hr tablet [Pharmacy Med Name: metFORMIN HCl ER 500 MG Oral Tablet Extended Release 24 Hour] 180 tablet 0    Sig: TAKE 1 TABLET BY MOUTH TWICE  DAILY WITH MEALS     Endocrinology:  Diabetes - Biguanides Failed - 05/21/2023  8:39 AM      Failed - HBA1C is between 0 and 7.9 and within 180 days    Hemoglobin A1C  Date Value Ref Range Status  05/07/2022 6.4  Final    Comment:    House call report   Hgb A1c MFr Bld  Date Value Ref Range Status  02/27/2023 8.2 (H) <5.7 % of total Hgb Final    Comment:    For someone without known diabetes, a hemoglobin A1c value of 6.5% or greater indicates that they may have  diabetes and this should be confirmed with a follow-up  test. . For someone with known diabetes, a value <7% indicates  that their diabetes is well controlled and a value  greater than or equal to 7% indicates suboptimal  control. A1c targets should be individualized based on  duration of diabetes, age, comorbid conditions, and  other considerations. . Currently, no consensus exists regarding use of hemoglobin A1c for diagnosis of diabetes for children. .          Failed - B12 Level in normal range and within 720 days    No results found for: "VITAMINB12"       Passed - Cr in normal range and within 360 days    Creat  Date Value Ref Range Status  02/27/2023 0.75 0.70 - 1.28 mg/dL Final   Creatinine, Urine  Date Value Ref Range Status  08/08/2022 183 20 - 320 mg/dL Final         Passed - eGFR in normal range and within 360 days    GFR, Est African American  Date Value Ref Range Status  04/25/2020 99 > OR = 60 mL/min/1.5m2 Final   GFR, Est Non African American  Date Value Ref Range Status  04/25/2020 85 > OR = 60 mL/min/1.36m2 Final   GFR, Estimated  Date Value Ref Range Status  01/01/2023 >60 >60 mL/min Final    Comment:    (NOTE) Calculated using the CKD-EPI  Creatinine Equation (2021)    eGFR  Date Value Ref Range Status  02/27/2023 92 > OR = 60 mL/min/1.67m2 Final         Passed - Valid encounter within last 6 months    Recent Outpatient Visits           2 months ago Hyperlipidemia associated with type 2 diabetes mellitus (HCC)   [facility] Mecum, Erin E, PA-C   6 months ago Type 2 diabetes mellitus without complication, without long-term current use of insulin [facility])   [facility], Sheliah Mends, PA-C   9 months ago Type 2 diabetes mellitus without complication, without long-term current use of insulin (HCC)   [facility] Mecum, Erin E, PA-C   1 year ago Hyperlipidemia associated with type 2 diabetes mellitus [facility])   [facility] Danelle Berry, PA-C   1 year ago Encounter for examination following treatment at hospital   [facility] Danelle Berry, [place]       Future Appointments             In  1 week Danelle Berry, PA-C [facility]            Passed - CBC within normal limits and completed in the last 12 months    WBC  Date Value Ref Range Status  01/01/2023 7.8 4.0 - 10.5 K/uL Final   RBC  Date Value Ref Range Status  01/01/2023 4.05 (L) 4.22 - 5.81 MIL/uL Final   Hemoglobin  Date Value Ref Range Status  01/01/2023 12.5 (L) 13.0 - 17.0 g/dL Final  16/12/9602 54.0 13.0 - 17.7 g/dL Final   HCT  Date Value Ref Range Status  01/01/2023 36.0 (L) 39.0 - 52.0 % Final   Hematocrit  Date Value Ref Range Status  01/20/2019 43.5 37.5 - 51.0 % Final   MCHC  Date Value Ref Range Status  01/01/2023 34.7 30.0 - 36.0 g/dL Final   [facility]  Date Value Ref Range Status  01/01/2023 30.9 26.0 - 34.0 pg Final   MCV  Date Value Ref Range Status  01/01/2023 88.9 80.0 - 100.0 fL Final  01/20/2019 88 79 - 97 fL Final   No results found for: "PLTCOUNTKUC", "LABPLAT",  "POCPLA" RDW  Date Value Ref Range Status  01/01/2023 12.1 11.5 - 15.5 % Final  01/20/2019 11.8 11.6 - 15.4 % Final

## 2023-05-30 ENCOUNTER — Ambulatory Visit: Payer: Medicare Other | Admitting: Physician Assistant

## 2023-05-31 ENCOUNTER — Ambulatory Visit: Payer: Medicare Other | Admitting: Family Medicine

## 2023-05-31 VITALS — BP 128/76 | HR 87 | Resp 16 | Ht 67.0 in | Wt 164.0 lb

## 2023-05-31 DIAGNOSIS — E1169 Type 2 diabetes mellitus with other specified complication: Secondary | ICD-10-CM

## 2023-05-31 DIAGNOSIS — I152 Hypertension secondary to endocrine disorders: Secondary | ICD-10-CM

## 2023-05-31 DIAGNOSIS — I69359 Hemiplegia and hemiparesis following cerebral infarction affecting unspecified side: Secondary | ICD-10-CM

## 2023-05-31 DIAGNOSIS — I69391 Dysphagia following cerebral infarction: Secondary | ICD-10-CM | POA: Diagnosis not present

## 2023-05-31 DIAGNOSIS — E1159 Type 2 diabetes mellitus with other circulatory complications: Secondary | ICD-10-CM

## 2023-05-31 DIAGNOSIS — E119 Type 2 diabetes mellitus without complications: Secondary | ICD-10-CM

## 2023-05-31 DIAGNOSIS — I69320 Aphasia following cerebral infarction: Secondary | ICD-10-CM

## 2023-05-31 DIAGNOSIS — Z7984 Long term (current) use of oral hypoglycemic drugs: Secondary | ICD-10-CM

## 2023-05-31 DIAGNOSIS — E1165 Type 2 diabetes mellitus with hyperglycemia: Secondary | ICD-10-CM

## 2023-05-31 DIAGNOSIS — E785 Hyperlipidemia, unspecified: Secondary | ICD-10-CM

## 2023-05-31 LAB — POCT GLYCOSYLATED HEMOGLOBIN (HGB A1C): Hemoglobin A1C: 7.1 % — AB (ref 4.0–5.6)

## 2023-05-31 MED ORDER — METFORMIN HCL ER 750 MG PO TB24
750.0000 mg | ORAL_TABLET | Freq: Every day | ORAL | 0 refills | Status: DC
Start: 2023-05-31 — End: 2023-08-27

## 2023-05-31 NOTE — Progress Notes (Signed)
 Name: Gerald Bauer   MRN: [id number]    DOB: 07-11-1945   Date:05/31/2023       Progress Note  Chief Complaint  Patient presents with   Medical Management of Chronic Issues     Subjective:   Gerald Bauer is a 78 y.o. male, presents to clinic for routine follow up on chronic conditions  Here for routine f/up, was last seen 3 months ago by float provider  T2DM on metformin, asked pt to increase dose and f/up, but he has not increase metformin and is only taking it once a day Lab Results  Component Value Date   HGBA1C 8.2 (H) 02/27/2023   HGBA1C 7.6 (H) 08/08/2022   HGBA1C 6.4 05/07/2022   HGBA1C 6.8 (H) 02/07/2022   HGBA1C 7.1 (H) 10/06/2021      Current Outpatient Medications:    acetaminophen (TYLENOL) 325 MG tablet, Take 2 tablets (650 mg total) by mouth every 4 (four) hours as needed for mild pain (or temp > 37.5 C (99.5 F))., Disp: , Rfl:    amLODipine (NORVASC) 5 MG tablet, Take 1 tablet (5 mg total) by mouth daily., Disp: 90 tablet, Rfl: 3   aspirin EC 81 MG tablet, Take 81 mg by mouth daily. Swallow whole., Disp: , Rfl:    Cholecalciferol (VITAMIN D3) 25 MCG (1000 UT) CAPS, Take 1,000 Units by mouth daily., Disp: , Rfl:    clopidogrel (PLAVIX) 75 MG tablet, Take 1 tablet (75 mg total) by mouth daily., Disp: 90 tablet, Rfl: 3   Cyanocobalamin (VITAMIN B12) 500 MCG TABS, Take 500 mcg by mouth daily., Disp: , Rfl:    Evolocumab (REPATHA SURECLICK) 140 MG/ML SOAJ, Inject 140 mg Subcutaneously every 2 weeks., Disp: 2 mL, Rfl: 6   Lancets (ONETOUCH DELICA PLUS LANCET33G) MISC, USE WITH METER TO CHECK BLOOD  GLUCOSE ONCE DAILY AS DIRECTED, Disp: 100 each, Rfl: 3   metFORMIN (GLUCOPHAGE-XR) 500 MG 24 hr tablet, TAKE 1 TABLET BY MOUTH TWICE  DAILY WITH MEALS, Disp: 180 tablet, Rfl: 0   ONETOUCH VERIO test strip, CHECK FINGERSTICK BLOOD SUGARS  ONCE DAILY, Disp: 100 strip, Rfl: 3  Patient Active Problem List   Diagnosis Date Noted   Carotid stenosis, left 12/31/2022    Claustrophobia 04/19/2022   H/O prostatectomy 02/06/2022   Hemiparesis, aphasia, 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) 11/07/2021   Lower urinary tract symptoms (LUTS) 11/07/2021   Myalgia due to statin 02/21/2021   Type 2 diabetes mellitus without complication, without long-term current use of insulin (HCC) 02/21/2021   Aortic atherosclerosis (HCC) 04/27/2020   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   Onychomycosis 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) 07/19/2016   Carotid stenosis 03/13/2016   Hyperlipidemia associated with type 2 diabetes mellitus (HCC)    Hypertension associated with type 2 diabetes mellitus (HCC)    Personal history of prostate cancer    Carotid atherosclerosis    GERD (gastroesophageal reflux disease) 08/20/2013    Past Surgical History:  Procedure Laterality Date   APPENDECTOMY  1973   CAROTID PTA/STENT INTERVENTION Left 12/31/2022   Procedure: CAROTID PTA/STENT INTERVENTION;  Surgeon: Annice Needy, MD;  Location: [facility] INVASIVE CV LAB;  Service: Cardiovascular;  Laterality: Left;   CHOLECYSTECTOMY  2013   PROSTATECTOMY  2012    Family History  Problem Relation Age of Onset   Dementia Mother    High Cholesterol Mother    Hypertension Mother    Arthritis Mother    Hyperlipidemia  Mother    Heart disease Father    Heart attack Father    Arthritis Maternal Grandmother    Cancer Maternal Grandmother        colon   Prostate cancer Brother    Cancer Brother        prostate   Cancer Maternal Uncle        colon   COPD Maternal Uncle    Stroke Paternal Grandmother    Diabetes Paternal Grandmother    Aneurysm Paternal Grandfather     Social History   Tobacco Use   Smoking status: Former    Current packs/day: 0.00    Average packs/day: 1 pack/day for 40.0 years (40.0 ttl pk-yrs)    Types: Cigarettes    Start date: 03/09/1957    Quit date: 03/09/1997    Years since quitting: 26.2   Smokeless tobacco: Former   Tobacco comments:     smoking cessation materials not required  Vaping Use   Vaping status: Never Used  Substance Use Topics   Alcohol use: No    Alcohol/week: 0.0 standard drinks of alcohol   Drug use: No     Allergies  Allergen Reactions   Amoxicillin Swelling and Other (See Comments)   Statins Other (See Comments)    Affects the liver   Welchol [Colesevelam Hcl] Other (See Comments)    affects the liver    Health Maintenance  Topic Date Due   FOOT EXAM  05/08/2023   COVID-19 Vaccine (2 - Janssen risk series) 06/15/2023 (Originally 10/06/2019)   Diabetic kidney evaluation - Urine ACR  08/08/2023   HEMOGLOBIN A1C  08/28/2023   Medicare Annual Wellness (AWV)  01/17/2024   Diabetic kidney evaluation - eGFR measurement  02/27/2024   DTaP/Tdap/Td (2 - Tdap) 03/04/2024   OPHTHALMOLOGY EXAM  03/21/2024   Pneumonia Vaccine 60+ Years old  Completed   INFLUENZA VACCINE  Completed   Hepatitis C Screening  Completed   Zoster Vaccines- Shingrix  Completed   HPV VACCINES  Aged Out   Colonoscopy  Discontinued    Chart Review Today: .I personally reviewed active problem list, medication list, allergies, family history, social history, health maintenance, notes from last encounter, lab results, imaging with the patient/caregiver today.   Review of Systems  Constitutional: Negative.   HENT: Negative.    Eyes: Negative.   Respiratory: Negative.    Cardiovascular: Negative.   Gastrointestinal: Negative.   Endocrine: Negative.   Genitourinary: Negative.   Musculoskeletal: Negative.   Skin: Negative.   Allergic/Immunologic: Negative.   Neurological: Negative.   Hematological: Negative.   Psychiatric/Behavioral: Negative.    All other systems reviewed and are negative.    Objective:   Vitals:   05/31/23 1318  BP: 128/76  Pulse: 87  Resp: 16  SpO2: 99%  Weight: 164 lb (74.4 kg)  Height: 5\' 7"  (1.702 m)    Body mass index is 25.69 kg/m.  Physical Exam Vitals and nursing note reviewed.   Constitutional:      General: He is not in acute distress.    Appearance: He is well-developed. He is not ill-appearing, toxic-appearing or diaphoretic.  HENT:     Head: Normocephalic and atraumatic.     Nose: Nose normal.  Eyes:     General:        Right eye: No discharge.        Left eye: No discharge.     Conjunctiva/sclera: Conjunctivae normal.  Neck:     Trachea: No tracheal deviation.  Cardiovascular:     Rate and Rhythm: Normal rate and regular rhythm.     Pulses: Normal pulses.     Heart sounds: Normal heart sounds.  Pulmonary:     Effort: Pulmonary effort is normal. No respiratory distress.     Breath sounds: No stridor.  Musculoskeletal:        General: Normal range of motion.  Skin:    General: Skin is warm and dry.     Findings: No rash.  Neurological:     Mental Status: He is alert.     Motor: No abnormal muscle tone.     Coordination: Coordination normal.     Comments: Expressive aphagia   Psychiatric:        Behavior: Behavior normal.      Diabetic Foot Exam - Simple   Simple Foot Form Diabetic Foot exam was performed with the following findings: Yes 05/31/2023  1:46 PM  Visual Inspection No deformities, no ulcerations, no other skin breakdown bilaterally: Yes Sensation Testing Intact to touch and monofilament testing bilaterally: Yes Pulse Check Posterior Tibialis and Dorsalis pulse intact bilaterally: Yes Comments       Results for orders placed or performed in visit on 05/20/23  HM DIABETES EYE EXAM   Collection Time: 03/22/23 12:47 PM  Result Value Ref Range   HM Diabetic Eye Exam No Retinopathy No Retinopathy      Assessment & Plan:   Uncontrolled diabetes mellitus with hyperglycemia, without long-term current use of insulin (HCC) Assessment & Plan: Uncontrolled with last A1C elevated He did not increase metformin dose Working on diet Dm foot exam done today POC A1c did improve from 8.2 to 7.1 Will still see if he can tolerate  higher metformin dose - pt is not doing well with BID dosing of meds - will try to avoid increased pill burden Results for orders placed or performed in visit on 05/31/23  POCT HgB A1C   Collection Time: 05/31/23  1:46 PM  Result Value Ref Range   Hemoglobin A1C 7.1 (A) 4.0 - 5.6 %   HbA1c POC (<> result, manual entry)     HbA1c, POC (prediabetic range)     HbA1c, POC (controlled diabetic range)        Orders: -     HM Diabetes Foot Exam -     POCT glycosylated hemoglobin (Hb A1C) -     metFORMIN HCl ER; Take 1 tablet (750 mg total) by mouth daily with breakfast.  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) Assessment & Plan: Unchanged sx today   Hyperlipidemia associated with type 2 diabetes mellitus (HCC) Assessment & Plan: On Repatha, managed by specialists Lab Results  Component Value Date   CHOL 125 08/08/2022   HDL 48 08/08/2022   LDLCALC 55 08/08/2022   TRIG 140 08/08/2022   CHOLHDL 2.6 08/08/2022      Hypertension associated with type 2 diabetes mellitus (HCC) Assessment & Plan: BP well controlled and at goal today BP Readings from Last 3 Encounters:  05/31/23 128/76  05/09/23 134/84  04/04/23 122/80  Continue amlodipine 5 mg daily       Return for 1 month virtual f/up on med change and sugars, 3 month OV HTN HLD DM .   Danelle Berry, PA-C 05/31/23 1:27 PM

## 2023-05-31 NOTE — Patient Instructions (Addendum)
 [facility]:  [address] [place], [place] [address] [phone]

## 2023-06-09 ENCOUNTER — Other Ambulatory Visit: Payer: Self-pay | Admitting: Family Medicine

## 2023-06-09 DIAGNOSIS — E1165 Type 2 diabetes mellitus with hyperglycemia: Secondary | ICD-10-CM

## 2023-06-13 ENCOUNTER — Encounter: Payer: Self-pay | Admitting: Family Medicine

## 2023-06-13 NOTE — Assessment & Plan Note (Signed)
 Uncontrolled with last A1C elevated He did not increase metformin dose Working on diet Dm foot exam done today POC A1c did improve from 8.2 to 7.1 Will still see if he can tolerate higher metformin dose - pt is not doing well with BID dosing of meds - will try to avoid increased pill burden Results for orders placed or performed in visit on 05/31/23  POCT HgB A1C   Collection Time: 05/31/23  1:46 PM  Result Value Ref Range   Hemoglobin A1C 7.1 (A) 4.0 - 5.6 %   HbA1c POC (<> result, manual entry)     HbA1c, POC (prediabetic range)     HbA1c, POC (controlled diabetic range)

## 2023-06-13 NOTE — Assessment & Plan Note (Signed)
 Unchanged sx today

## 2023-06-13 NOTE — Assessment & Plan Note (Signed)
 BP well controlled and at goal today BP Readings from Last 3 Encounters:  05/31/23 128/76  05/09/23 134/84  04/04/23 122/80  Continue amlodipine 5 mg daily

## 2023-06-13 NOTE — Assessment & Plan Note (Signed)
 On Repatha, managed by specialists Lab Results  Component Value Date   CHOL 125 08/08/2022   HDL 48 08/08/2022   LDLCALC 55 08/08/2022   TRIG 140 08/08/2022   CHOLHDL 2.6 08/08/2022

## 2023-06-14 ENCOUNTER — Other Ambulatory Visit: Payer: Self-pay | Admitting: Family Medicine

## 2023-06-14 DIAGNOSIS — E1165 Type 2 diabetes mellitus with hyperglycemia: Secondary | ICD-10-CM

## 2023-06-28 ENCOUNTER — Ambulatory Visit: Admitting: Nurse Practitioner

## 2023-07-08 NOTE — Progress Notes (Signed)
 [facility] Quality Team Note  Name: Ravindra Baranek Date of Birth: November 04, 1945 MRN: [id number] Date: 07/08/2023  [facility] Quality Team has reviewed this patient's chart, please see recommendations below:  [facility] Quality Other; (Patient due for KED labs. Patient needs eGFR and URINE Microalbumin/Creatinine ratio for gap closure. Please address at upcoming appt with PCP 08/28/2023)

## 2023-08-13 ENCOUNTER — Encounter (INDEPENDENT_AMBULATORY_CARE_PROVIDER_SITE_OTHER): Payer: Self-pay

## 2023-08-28 ENCOUNTER — Ambulatory Visit (INDEPENDENT_AMBULATORY_CARE_PROVIDER_SITE_OTHER): Admitting: Family Medicine

## 2023-08-28 ENCOUNTER — Encounter: Payer: Self-pay | Admitting: Family Medicine

## 2023-08-28 VITALS — BP 124/76 | HR 89 | Resp 16 | Ht 67.0 in | Wt 165.0 lb

## 2023-08-28 DIAGNOSIS — I6523 Occlusion and stenosis of bilateral carotid arteries: Secondary | ICD-10-CM | POA: Diagnosis not present

## 2023-08-28 DIAGNOSIS — E1159 Type 2 diabetes mellitus with other circulatory complications: Secondary | ICD-10-CM

## 2023-08-28 DIAGNOSIS — E1169 Type 2 diabetes mellitus with other specified complication: Secondary | ICD-10-CM | POA: Diagnosis not present

## 2023-08-28 DIAGNOSIS — Z5181 Encounter for therapeutic drug level monitoring: Secondary | ICD-10-CM

## 2023-08-28 DIAGNOSIS — G720 Drug-induced myopathy: Secondary | ICD-10-CM

## 2023-08-28 DIAGNOSIS — E785 Hyperlipidemia, unspecified: Secondary | ICD-10-CM

## 2023-08-28 DIAGNOSIS — E1165 Type 2 diabetes mellitus with hyperglycemia: Secondary | ICD-10-CM | POA: Diagnosis not present

## 2023-08-28 DIAGNOSIS — I69391 Dysphagia following cerebral infarction: Secondary | ICD-10-CM

## 2023-08-28 DIAGNOSIS — I152 Hypertension secondary to endocrine disorders: Secondary | ICD-10-CM

## 2023-08-28 DIAGNOSIS — I69320 Aphasia following cerebral infarction: Secondary | ICD-10-CM

## 2023-08-28 DIAGNOSIS — T466X5A Adverse effect of antihyperlipidemic and antiarteriosclerotic drugs, initial encounter: Secondary | ICD-10-CM

## 2023-08-28 DIAGNOSIS — I69359 Hemiplegia and hemiparesis following cerebral infarction affecting unspecified side: Secondary | ICD-10-CM

## 2023-08-28 DIAGNOSIS — K219 Gastro-esophageal reflux disease without esophagitis: Secondary | ICD-10-CM

## 2023-08-28 MED ORDER — METFORMIN HCL ER 500 MG PO TB24: 1000.0000 mg | ORAL_TABLET | Freq: Every day | ORAL | 1 refills | Status: DC

## 2023-08-28 MED ORDER — FREESTYLE LIBRE 3 READER DEVI: 0 refills | Status: DC

## 2023-08-28 MED ORDER — PANTOPRAZOLE SODIUM 20 MG PO TBEC: 20.0000 mg | DELAYED_RELEASE_TABLET | ORAL | 1 refills | Status: DC

## 2023-08-28 MED ORDER — REPATHA SURECLICK 140 MG/ML ~~LOC~~ SOAJ: SUBCUTANEOUS | 6 refills | Status: DC

## 2023-08-28 MED ORDER — FREESTYLE LIBRE 3 SENSOR MISC
1.0000 | 11 refills | Status: AC
Start: 2023-08-28 — End: ?

## 2023-08-28 NOTE — Assessment & Plan Note (Signed)
 On repatha  per cardiology Due for annual LP Will order labs and refill meds, cardiology note states they did not need to see him x 12 months next January Lab Results  Component Value Date   CHOL 125 08/08/2022   HDL 48 08/08/2022   LDLCALC 55 08/08/2022   TRIG 140 08/08/2022   CHOLHDL 2.6 08/08/2022

## 2023-08-28 NOTE — Assessment & Plan Note (Signed)
 Doing well on repatha , cannot tolerate statins

## 2023-08-28 NOTE — Progress Notes (Signed)
 Name: Gerald Bauer   MRN: [id number]    DOB: 1946-01-30   Date:08/28/2023       Progress Note  Chief Complaint  Patient presents with   Medical Management of Chronic Issues   Diabetes     Subjective:   Gerald Bauer is a 78 y.o. male, presents to clinic for routine follow up on chronic conditions  DM was not well controlled but did improve with diet and he was trying to increase metformin  dose, improved from >8 to 7.1 as of last OV Lab Results  Component Value Date   HGBA1C 7.1 (A) 05/31/2023  He was unable to swallow 750 XR metformin  pill so he took 2 of his 500 mg metformin  - taking BID, GI sx with taking 1000 mg at once Too early to repeat labs  Did libre 3 and it helped him understand CBGs and food 170's high  HLD due for repatha  refill, but they got an error from the mail pharmacy Jan 2025 rx with 6 refills from cardiology - last cardiology appt said 12 months f/up Lab Results  Component Value Date   CHOL 125 08/08/2022   HDL 48 08/08/2022   LDLCALC 55 08/08/2022   TRIG 140 08/08/2022   CHOLHDL 2.6 08/08/2022  Lipids due  HE has f/up with cardiology and vascular, no new concerns  HTN well controlled today on current meds on amlodipine  only  GERD sx, bothering him at least 1 a week to once a day, using tums often  On plavix  for atherosclerotic disease and s/p stents (left carotid), s/p MCA stroke with hemiparesis, aphasia and dysphagia        Current Outpatient Medications:    acetaminophen  (TYLENOL ) 325 MG tablet, Take 2 tablets (650 mg total) by mouth every 4 (four) hours as needed for mild pain (or temp > 37.5 C (99.5 F))., Disp: , Rfl:    amLODipine  (NORVASC ) 5 MG tablet, Take 1 tablet (5 mg total) by mouth daily., Disp: 90 tablet, Rfl: 3   aspirin  EC 81 MG tablet, Take 81 mg by mouth daily. Swallow whole., Disp: , Rfl:    Cholecalciferol  (VITAMIN D3) 25 MCG (1000 UT) CAPS, Take 1,000 Units by mouth daily., Disp: , Rfl:    clopidogrel  (PLAVIX ) 75  MG tablet, Take 1 tablet (75 mg total) by mouth daily., Disp: 90 tablet, Rfl: 3   Cyanocobalamin  (VITAMIN B12) 500 MCG TABS, Take 500 mcg by mouth daily., Disp: , Rfl:    Evolocumab  (REPATHA  SURECLICK) 140 MG/ML SOAJ, Inject 140 mg Subcutaneously every 2 weeks., Disp: 2 mL, Rfl: 6   Lancets (ONETOUCH DELICA PLUS LANCET33G) MISC, USE WITH METER TO CHECK BLOOD  GLUCOSE ONCE DAILY AS DIRECTED, Disp: 100 each, Rfl: 3   metFORMIN  (GLUCOPHAGE -XR) 750 MG 24 hr tablet, Take 1 tablet (750 mg total) by mouth daily with breakfast. 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) 11/07/2021   Lower urinary tract symptoms (LUTS) 11/07/2021   Myalgia due to statin 02/21/2021   Type 2 diabetes mellitus without complication, without long-term current use of insulin  (HCC) 02/21/2021   Aortic atherosclerosis (HCC) 04/27/2020  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   Onychomycosis 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) 07/19/2016   Carotid stenosis 03/13/2016   Hyperlipidemia associated with type 2 diabetes mellitus (HCC)    Hypertension associated with type 2 diabetes mellitus (HCC)    Personal history of prostate cancer    Carotid atherosclerosis    GERD (gastroesophageal reflux disease) 08/20/2013    Past Surgical History:  Procedure Laterality Date   APPENDECTOMY  1973   CAROTID PTA/STENT INTERVENTION Left 12/31/2022   Procedure: CAROTID PTA/STENT INTERVENTION;  Surgeon: Celso College, MD;  Location: [facility] INVASIVE CV LAB;  Service: Cardiovascular;  Laterality: Left;   CHOLECYSTECTOMY  2013   PROSTATECTOMY  2012    Family History   Problem Relation Age of Onset   Dementia Mother    High Cholesterol Mother    Hypertension Mother    Arthritis Mother    Hyperlipidemia Mother    Heart disease Father    Heart attack Father    Arthritis Maternal Grandmother    Cancer Maternal Grandmother        colon   Prostate cancer Brother    Cancer Brother        prostate   Cancer Maternal Uncle        colon   COPD Maternal Uncle    Stroke Paternal Grandmother    Diabetes Paternal Grandmother    Aneurysm Paternal Grandfather     Social History   Tobacco Use   Smoking status: Former    Current packs/day: 0.00    Average packs/day: 1 pack/day for 40.0 years (40.0 ttl pk-yrs)    Types: Cigarettes    Start date: 03/09/1957    Quit date: 03/09/1997    Years since quitting: 26.4   Smokeless tobacco: Former   Tobacco comments:    smoking cessation materials not required  Vaping Use   Vaping status: Never Used  Substance Use Topics   Alcohol use: No    Alcohol/week: 0.0 standard drinks of alcohol   Drug use: No     Allergies  Allergen Reactions   Amoxicillin Swelling and Other (See Comments)   Statins Other (See Comments)    Affects the liver   Welchol [Colesevelam Hcl] Other (See Comments)    affects the liver    Health Maintenance  Topic Date Due   Diabetic kidney evaluation - Urine ACR  08/08/2023   COVID-19 Vaccine (2 - Janssen risk series) 09/12/2023 (Originally 10/06/2019)   INFLUENZA VACCINE  10/25/2023   HEMOGLOBIN A1C  12/01/2023   Medicare Annual Wellness (AWV)  01/17/2024   Diabetic kidney evaluation - eGFR measurement  02/27/2024   DTaP/Tdap/Td (2 - Tdap) 03/04/2024   OPHTHALMOLOGY EXAM  03/21/2024   FOOT EXAM  05/30/2024   Pneumonia Vaccine 45+ Years old  Completed   Hepatitis C Screening  Completed   Zoster Vaccines- Shingrix  Completed   HPV VACCINES  Aged Out   Meningococcal B Vaccine  Aged Out   Colonoscopy  Discontinued    Chart Review Today: I personally reviewed active problem  list, medication list, allergies, family history, social history, health maintenance, notes from last encounter, lab results, imaging with the patient/caregiver today.   Review of Systems  Constitutional: Negative.   HENT: Negative.    Eyes: Negative.   Respiratory: Negative.    Cardiovascular: Negative.   Gastrointestinal: Negative.   Endocrine: Negative.   Genitourinary: Negative.   Musculoskeletal: Negative.   Skin: Negative.   Allergic/Immunologic: Negative.   Neurological:  Negative.   Hematological: Negative.   Psychiatric/Behavioral: Negative.    All other systems reviewed and are negative.    Objective:   Vitals:   08/28/23 1319  BP: 124/76  Pulse: 89  Resp: 16  SpO2: 97%  Weight: 165 lb (74.8 kg)  Height: 5\' 7"  (1.702 m)    Body mass index is 25.84 kg/m.  Physical Exam Vitals and nursing note reviewed.  Constitutional:      General: He is not in acute distress.    Appearance: Normal appearance. He is well-developed. He is not ill-appearing, toxic-appearing or diaphoretic.  HENT:     Head: Normocephalic and atraumatic.     Right Ear: External ear normal.     Left Ear: External ear normal.     Nose: Nose normal.  Eyes:     General: No scleral icterus.       Right eye: No discharge.        Left eye: No discharge.     Conjunctiva/sclera: Conjunctivae normal.  Neck:     Trachea: No tracheal deviation.  Cardiovascular:     Rate and Rhythm: Normal rate and regular rhythm.     Pulses: Normal pulses.     Heart sounds: Normal heart sounds.  Pulmonary:     Effort: Pulmonary effort is normal. No respiratory distress.     Breath sounds: Normal breath sounds. No stridor. No wheezing, rhonchi or rales.  Skin:    General: Skin is warm and dry.     Findings: No rash.  Neurological:     Mental Status: He is alert.     Motor: No abnormal muscle tone.     Coordination: Coordination normal.     Gait: Gait abnormal.     Comments: Baseline aphasia  Psychiatric:         Mood and Affect: Mood normal.        Behavior: Behavior normal.      Functional Status Survey:   Results for orders placed or performed in visit on 05/31/23  POCT HgB A1C   Collection Time: 05/31/23  1:46 PM  Result Value Ref Range   Hemoglobin A1C 7.1 (A) 4.0 - 5.6 %   HbA1c POC (<> result, manual entry)     HbA1c, POC (prediabetic range)     HbA1c, POC (controlled diabetic range)        Assessment & Plan:   Hyperlipidemia associated with type 2 diabetes mellitus (HCC) Assessment & Plan: On repatha  per cardiology Due for annual LP Will order labs and refill meds, cardiology note states they did not need to see him x 12 months next January Lab Results  Component Value Date   CHOL 125 08/08/2022   HDL 48 08/08/2022   LDLCALC 55 08/08/2022   TRIG 140 08/08/2022   CHOLHDL 2.6 08/08/2022     Orders: -     Comprehensive metabolic panel with GFR -     Lipid panel -     Repatha  SureClick; Inject 140 mg Subcutaneously every 2 weeks.  Dispense: 2 mL; Refill: 6  Uncontrolled diabetes mellitus with hyperglycemia, without long-term current use of insulin  [facility]) Assessment & Plan:   Lab Results  Component Value Date   HGBA1C 7.1 (A) 05/31/2023   HGBA1C 8.2 (H) 02/27/2023   HGBA1C 7.6 (H) 08/08/2022   HGBA1C 6.4 05/07/2022   HGBA1C 6.8 (H) 02/07/2022  Improved A1c with diet efforts and improving med compliance Metformin  dose was increased since he had poor compliance with BID dose  750 mg tablet was too large to swallow so he instead did 500 mg BID better than before, he tried sample of libre 3 meter and spikes only went up to 170's Labs due in a few days Goal <7.0% due for UACR, A1c and cmp Meds changed based on pt compliance and swallowing limitations with 750 XR metformin  If labs improved then we will do a 6 month f/up and he will continue metformin  XR 500 mg dose BID and diet/lifestyle efforts Libre 3 sensors and reader ordered, if not covered by insurance then he  can get samples from clinic since this really helped give him feedback on diet   Orders: -     metFORMIN  HCl ER; Take 2 tablets (1,000 mg total) by mouth daily with breakfast.  Dispense: 180 tablet; Refill: 1 -     Hemoglobin A1c -     Comprehensive metabolic panel with GFR -     Microalbumin / creatinine urine ratio -     FreeStyle Libre 3 Sensor; 1 Device by Does not apply route every 14 (fourteen) days.  Dispense: 2 each; Refill: 11 -     FreeStyle Libre 3 Reader; Use as directed with libre 3 sensors for continuous glucose monitoring  Dispense: 1 each; Refill: 0  Bilateral carotid artery stenosis Assessment & Plan: S/p stent to the left, doing monitoring with vascular and on plavix   Orders: -     Repatha  SureClick; Inject 140 mg Subcutaneously every 2 weeks.  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 One hour before breakfast  Dispense: 28 tablet; Refill: 1  Statin myopathy Assessment & Plan: Doing well on repatha , cannot tolerate statins  Orders: -     Repatha  SureClick; Inject 140 mg Subcutaneously every 2 weeks.  Dispense: 2 mL; Refill: 6  Hypertension associated with type 2 diabetes mellitus (HCC) Assessment & Plan: BP well controlled and at goal today BP Readings from Last 3 Encounters:  08/28/23 124/76  05/31/23 128/76  05/09/23 134/84  Continue amlodipine  5 mg daily   Orders: -     Comprehensive metabolic panel with GFR  Encounter for monitoring antiplatelet therapy -     Comprehensive metabolic panel with GFR -     CBC with  Differential/Platelet   Labs - they need to return next week for labs (after 6/7)    Return in about 6 months (around 02/27/2024) for DM, HTN.   Adeline Hone, PA-C 08/28/23 1:31 PM

## 2023-08-28 NOTE — Assessment & Plan Note (Signed)
 Stable chronic sx s/p left MCA stroke 2023

## 2023-08-28 NOTE — Assessment & Plan Note (Signed)
 Symptomatic, at least bothersome sx once a day Will do trial PPI for 2-4 weeks and if continued sx will have him consult GI

## 2023-08-28 NOTE — Assessment & Plan Note (Signed)
   Lab Results  Component Value Date   HGBA1C 7.1 (A) 05/31/2023   HGBA1C 8.2 (H) 02/27/2023   HGBA1C 7.6 (H) 08/08/2022   HGBA1C 6.4 05/07/2022   HGBA1C 6.8 (H) 02/07/2022  Improved A1c with diet efforts and improving med compliance Metformin  dose was increased since he had poor compliance with BID dose 750 mg tablet was too large to swallow so he instead did 500 mg BID better than before, he tried sample of libre 3 meter and spikes only went up to 170's Labs due in a few days Goal <7.0% due for UACR, A1c and cmp Meds changed based on pt compliance and swallowing limitations with 750 XR metformin  If labs improved then we will do a 6 month f/up and he will continue metformin  XR 500 mg dose BID and diet/lifestyle efforts Libre 3 sensors and reader ordered, if not covered by insurance then he can get samples from clinic since this really helped give him feedback on diet

## 2023-08-28 NOTE — Assessment & Plan Note (Signed)
 S/p stent to the left, doing monitoring with vascular and on plavix 

## 2023-08-28 NOTE — Assessment & Plan Note (Signed)
 BP well controlled and at goal today BP Readings from Last 3 Encounters:  08/28/23 124/76  05/31/23 128/76  05/09/23 134/84  Continue amlodipine  5 mg daily

## 2023-09-02 ENCOUNTER — Other Ambulatory Visit (HOSPITAL_COMMUNITY): Payer: Self-pay

## 2023-09-02 ENCOUNTER — Telehealth: Payer: Self-pay | Admitting: Pharmacy Technician

## 2023-09-02 NOTE — Telephone Encounter (Signed)
 Pharmacy Patient Advocate Encounter   Received notification from Onbase that prior authorization for FreeStyle Libre 3 Sensor is required/requested.   Insurance verification completed.   The patient is insured through [facility] .   Per test claim: PA required; PA submitted to above mentioned insurance via CoverMyMeds Key/confirmation #/EOC [id number] Status is pending

## 2023-09-03 NOTE — Telephone Encounter (Signed)
 Pharmacy Patient Advocate Encounter  Received notification from [employer] that Prior Authorization for FreeStyle Libre 3 Sensor  has been DENIED.  Full denial letter will be uploaded to the media tab. See denial reason below.     PA #/Case ID/Reference #: [id number]

## 2023-09-25 ENCOUNTER — Ambulatory Visit: Payer: Self-pay | Admitting: Family Medicine

## 2023-09-25 LAB — HEMOGLOBIN A1C
Hgb A1c MFr Bld: 7 % — ABNORMAL HIGH (ref <5.7)
Mean Plasma Glucose: 154 mg/dL
eAG (mmol/L): 8.5 mmol/L

## 2023-09-25 LAB — CBC WITH DIFFERENTIAL/PLATELET
Absolute Lymphocytes: 2419 {cells}/uL (ref 850–3900)
Absolute Monocytes: 570 {cells}/uL (ref 200–950)
Basophils Absolute: 80 {cells}/uL (ref 0–200)
Basophils Relative: 1.2 %
Eosinophils Absolute: 228 {cells}/uL (ref 15–500)
Eosinophils Relative: 3.4 %
HCT: 43.6 % (ref 38.5–50.0)
Hemoglobin: 14.5 g/dL (ref 13.2–17.1)
MCH: 30.2 pg (ref 27.0–33.0)
MCHC: 33.3 g/dL (ref 32.0–36.0)
MCV: 90.8 fL (ref 80.0–100.0)
MPV: 9.5 fL (ref 7.5–12.5)
Monocytes Relative: 8.5 %
Neutro Abs: 3404 {cells}/uL (ref 1500–7800)
Neutrophils Relative %: 50.8 %
Platelets: 222 10*3/uL (ref 140–400)
RBC: 4.8 10*6/uL (ref 4.20–5.80)
RDW: 12.2 % (ref 11.0–15.0)
Total Lymphocyte: 36.1 %
WBC: 6.7 10*3/uL (ref 3.8–10.8)

## 2023-09-25 LAB — COMPREHENSIVE METABOLIC PANEL WITH GFR
AG Ratio: 2 (calc) (ref 1.0–2.5)
ALT: 48 U/L — ABNORMAL HIGH (ref 9–46)
AST: 25 U/L (ref 10–35)
Albumin: 4.5 g/dL (ref 3.6–5.1)
Alkaline phosphatase (APISO): 64 U/L (ref 35–144)
BUN: 13 mg/dL (ref 7–25)
CO2: 27 mmol/L (ref 20–32)
Calcium: 9.3 mg/dL (ref 8.6–10.3)
Chloride: 103 mmol/L (ref 98–110)
Creat: 0.85 mg/dL (ref 0.70–1.28)
Globulin: 2.3 g/dL (ref 1.9–3.7)
Glucose, Bld: 147 mg/dL — ABNORMAL HIGH (ref 65–99)
Potassium: 3.9 mmol/L (ref 3.5–5.3)
Sodium: 140 mmol/L (ref 135–146)
Total Bilirubin: 0.6 mg/dL (ref 0.2–1.2)
Total Protein: 6.8 g/dL (ref 6.1–8.1)
eGFR: 89 mL/min/{1.73_m2} (ref >=60)

## 2023-09-25 LAB — LIPID PANEL
Cholesterol: 244 mg/dL — ABNORMAL HIGH (ref <200)
HDL: 44 mg/dL (ref >=40)
LDL Cholesterol (Calc): 169 mg/dL — ABNORMAL HIGH
Non-HDL Cholesterol (Calc): 200 mg/dL — ABNORMAL HIGH (ref <130)
Total CHOL/HDL Ratio: 5.5 (calc) — ABNORMAL HIGH (ref <5.0)
Triglycerides: 162 mg/dL — ABNORMAL HIGH (ref <150)

## 2023-09-25 LAB — MICROALBUMIN / CREATININE URINE RATIO
Creatinine, Urine: 243 mg/dL (ref 20–320)
Microalb Creat Ratio: 5 mg/g{creat} (ref <30)
Microalb, Ur: 1.3 mg/dL

## 2023-10-08 ENCOUNTER — Other Ambulatory Visit: Payer: Self-pay | Admitting: Family Medicine

## 2023-10-08 DIAGNOSIS — K219 Gastro-esophageal reflux disease without esophagitis: Secondary | ICD-10-CM

## 2023-10-09 NOTE — Telephone Encounter (Signed)
 Requested medication (s) are due for refill today - yes- mail order  Requested medication (s) are on the active medication list -yes  Future visit scheduled -yes  Last refill: 08/28/23 #28 1RF  Notes to clinic: Not sure if this is to be continued- see visit note- may need GI referral.   Requested Prescriptions  Pending Prescriptions Disp Refills   pantoprazole  (PROTONIX ) 20 MG tablet [Pharmacy Med Name: Pantoprazole  Sodium 20 MG Oral Tablet Delayed Release] 56 tablet 5    Sig: TAKE 1 TABLET BY MOUTH EVERY  MORNING 1 HOUR BEFORE BREAKFAST     Gastroenterology: Proton Pump Inhibitors Passed - 10/09/2023 12:38 PM      Passed - Valid encounter within last 12 months    Recent Outpatient Visits           1 month ago Hyperlipidemia associated with type 2 diabetes mellitus [facility])   [facility] Leavy Mole, PA-C   4 months ago Uncontrolled diabetes mellitus with hyperglycemia, without long-term current use of insulin  [facility])   [place] [facility] Leavy Mole, PA-C                 Requested Prescriptions  Pending Prescriptions Disp Refills   pantoprazole  (PROTONIX ) 20 MG tablet [Pharmacy Med Name: Pantoprazole  Sodium 20 MG Oral Tablet Delayed Release] 56 tablet 5    Sig: TAKE 1 TABLET BY MOUTH EVERY  MORNING 1 HOUR BEFORE BREAKFAST     Gastroenterology: Proton Pump Inhibitors Passed - 10/09/2023 12:38 PM      Passed - Valid encounter within last 12 months    Recent Outpatient Visits           1 month ago Hyperlipidemia associated with type 2 diabetes mellitus [facility])    [facility] Leavy Mole, PA-C   4 months ago Uncontrolled diabetes mellitus with hyperglycemia, without long-term current use of insulin  [facility])   [facility] Leavy Mole, PA-C

## 2023-11-08 ENCOUNTER — Other Ambulatory Visit (INDEPENDENT_AMBULATORY_CARE_PROVIDER_SITE_OTHER): Payer: Self-pay | Admitting: Nurse Practitioner

## 2023-11-08 DIAGNOSIS — I6523 Occlusion and stenosis of bilateral carotid arteries: Secondary | ICD-10-CM

## 2023-11-12 ENCOUNTER — Encounter (INDEPENDENT_AMBULATORY_CARE_PROVIDER_SITE_OTHER): Payer: Self-pay | Admitting: Vascular Surgery

## 2023-11-12 ENCOUNTER — Other Ambulatory Visit (INDEPENDENT_AMBULATORY_CARE_PROVIDER_SITE_OTHER): Payer: Medicare Other

## 2023-11-12 ENCOUNTER — Ambulatory Visit (INDEPENDENT_AMBULATORY_CARE_PROVIDER_SITE_OTHER): Payer: Medicare Other | Admitting: Vascular Surgery

## 2023-11-12 VITALS — BP 144/89 | HR 90 | Ht 67.0 in | Wt 165.4 lb

## 2023-11-12 DIAGNOSIS — I6523 Occlusion and stenosis of bilateral carotid arteries: Secondary | ICD-10-CM

## 2023-11-12 DIAGNOSIS — E119 Type 2 diabetes mellitus without complications: Secondary | ICD-10-CM | POA: Diagnosis not present

## 2023-11-12 DIAGNOSIS — E1169 Type 2 diabetes mellitus with other specified complication: Secondary | ICD-10-CM | POA: Diagnosis not present

## 2023-11-12 DIAGNOSIS — I152 Hypertension secondary to endocrine disorders: Secondary | ICD-10-CM

## 2023-11-12 DIAGNOSIS — E1159 Type 2 diabetes mellitus with other circulatory complications: Secondary | ICD-10-CM

## 2023-11-12 DIAGNOSIS — I6522 Occlusion and stenosis of left carotid artery: Secondary | ICD-10-CM | POA: Diagnosis not present

## 2023-11-12 DIAGNOSIS — E785 Hyperlipidemia, unspecified: Secondary | ICD-10-CM

## 2023-11-12 NOTE — Progress Notes (Signed)
 MRN : [id number]  Gerald Bauer is a 78 y.o. ([birth year]) male who presents with chief complaint of  Chief Complaint  Patient presents with   Carotid    Encounter form: F/u 6 months Carotid  JD ,  .  History of Present Illness: Patient returns today in follow up of his carotid disease.  He had a left hemispheric stroke previously and still has some issues with aphasia.  His right-sided weakness is much better.  He has had no new focal neurologic symptoms since his carotid stent last October and no new changes since his last visit 6 months ago. Carotid duplex today reveals stable 1 to 39% right ICA stenosis and a widely patent left carotid stent.   Current Outpatient Medications  Medication Sig Dispense Refill   acetaminophen  (TYLENOL ) 325 MG tablet Take 2 tablets (650 mg total) by mouth every 4 (four) hours as needed for mild pain (or temp > 37.5 C (99.5 F)).     amLODipine  (NORVASC ) 5 MG tablet Take 1 tablet (5 mg total) by mouth daily. 90 tablet 3   aspirin  EC 81 MG tablet Take 81 mg by mouth daily. Swallow whole.     Cholecalciferol  (VITAMIN D3) 25 MCG (1000 UT) CAPS Take 1,000 Units by mouth daily.     clopidogrel  (PLAVIX ) 75 MG tablet Take 1 tablet (75 mg total) by mouth daily. 90 tablet 3   Cyanocobalamin  (VITAMIN B12) 500 MCG TABS Take 500 mcg by mouth daily.     Evolocumab  (REPATHA  SURECLICK) 140 MG/ML SOAJ Inject 140 mg Subcutaneously every 2 weeks. 2 mL 6   Lancets (ONETOUCH DELICA PLUS LANCET33G) MISC USE WITH METER TO CHECK BLOOD  GLUCOSE ONCE DAILY AS DIRECTED 100 each 3   metFORMIN  (GLUCOPHAGE -XR) 500 MG 24 hr tablet Take 2 tablets (1,000 mg total) by mouth daily with breakfast. 180 tablet 1   ONETOUCH VERIO test strip CHECK FINGERSTICK BLOOD SUGARS  ONCE DAILY 100 strip 3   pantoprazole  (PROTONIX ) 20 MG tablet Take 1 tablet (20 mg total) by mouth every morning for 28 days. One hour before breakfast 28 tablet 1   Continuous Glucose Receiver (FREESTYLE LIBRE 3 READER)  DEVI Use as directed with libre 3 sensors for continuous glucose monitoring (Patient not taking: Reported on 11/12/2023) 1 each 0   Continuous Glucose Sensor (FREESTYLE LIBRE 3 SENSOR) MISC 1 Device by Does not apply route every 14 (fourteen) days. (Patient not taking: Reported on 11/12/2023) 2 each 11   No current facility-administered medications for this visit.    Past Medical History:  Diagnosis Date   Acid reflux    Aortic atherosclerosis (HCC)    a. Noted on CT Abd in 2012.   Arthritis    hands   Carotid atherosclerosis    a. 02/2018 U/S: RICA 1-39%, LICA 40-59%; b. 09/2021 MRA Head/Neck: near complete occlusion L ICA and L MCA. Patent RICA.   Diabetes mellitus without complication (HCC)    Diastolic dysfunction    a. 09/2021 Echo: EF 55-60%, no rwma, GrI DD, nl RV fxn, RVSP 14.29mmHg.   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 09/2021 MRI/MRA  Brain: Acute L MCA distribution infarct. Near complete occlusion L ICA and MCA.   Tachycardia    Tinea pedis of both feet 01/10/2017    Past Surgical History:  Procedure Laterality Date   APPENDECTOMY  1973   CAROTID PTA/STENT INTERVENTION Left 12/31/2022   Procedure: CAROTID PTA/STENT INTERVENTION;  Surgeon: Marea Selinda RAMAN, MD;  Location: [facility] INVASIVE CV LAB;  Service: Cardiovascular;  Laterality: Left;   CHOLECYSTECTOMY  2013   PROSTATECTOMY  2012     Social History   Tobacco Use   Smoking status: Former    Current packs/day: 0.00    Average packs/day: 1 pack/day for 40.0 years (40.0 ttl pk-yrs)    Types: Cigarettes    Start date: 03/09/1957    Quit date: 03/09/1997    Years since quitting: 26.6   Smokeless tobacco: Former   Tobacco  comments:    smoking cessation materials not required  Vaping Use   Vaping status: Never Used  Substance Use Topics   Alcohol use: No    Alcohol/week: 0.0 standard drinks of alcohol   Drug use: No       Family History  Problem Relation Age of Onset   Dementia Mother    High Cholesterol Mother    Hypertension Mother    Arthritis Mother    Hyperlipidemia Mother    Heart disease Father    Heart attack Father    Arthritis Maternal Grandmother    Cancer Maternal Grandmother        colon   Prostate cancer Brother    Cancer Brother        prostate   Cancer Maternal Uncle        colon   COPD Maternal Uncle    Stroke Paternal Grandmother    Diabetes Paternal Grandmother    Aneurysm Paternal Grandfather      Allergies  Allergen Reactions   Amoxicillin Swelling and Other (See Comments)   Statins Other (See Comments)    Affects the liver   Welchol [Colesevelam Hcl] Other (See Comments)    affects the liver     REVIEW OF SYSTEMS (Negative unless checked)  Constitutional: [] Weight loss  [] Fever  [] Chills Cardiac: [] Chest pain   [] Chest pressure   [] Palpitations   [] Shortness of breath when laying flat   [] Shortness of breath at rest   [] Shortness of breath with exertion. Vascular:  [] Pain in legs with walking   [] Pain in legs at rest   [] Pain in legs when laying flat   [] Claudication   [] Pain in feet when walking  [] Pain in feet at rest  [] Pain in feet when laying flat   [] History of DVT   [] Phlebitis   [] Swelling in legs   [] Varicose veins   [] Non-healing ulcers Pulmonary:   [] Uses home oxygen   [] Productive cough   [] Hemoptysis   [] Wheeze  [] COPD   [] Asthma Neurologic:  [] Dizziness  [] Blackouts   [] Seizures   [x] History of stroke   [] History of TIA  [] Aphasia   [] Temporary blindness   [] Dysphagia   [] Weakness or numbness in arms   [] Weakness or numbness in legs Musculoskeletal:  [x] Arthritis   [] Joint swelling   [x] Joint pain   [] Low back pain Hematologic:  [] Easy bruising   [] Easy bleeding   [] Hypercoagulable state   [] Anemic   Gastrointestinal:  [] Blood in stool   [] Vomiting blood  [x] Gastroesophageal reflux/heartburn   [] Abdominal pain Genitourinary:  [] Chronic kidney disease   [] Difficult urination  [] Frequent urination  [] Burning with urination   []   Hematuria Skin:  [] Rashes   [] Ulcers   [] Wounds Psychological:  [] History of anxiety   []  History of major depression.  Physical Examination  BP (!) 144/89   Pulse 90   Ht 5' 7 (1.702 m)   Wt 165 lb 6.4 oz (75 kg)   BMI 25.91 kg/m  Gen:  WD/WN, NAD Head: [place]/AT, No temporalis wasting. Ear/Nose/Throat: Hearing grossly intact, nares w/o erythema or drainage Eyes: Conjunctiva clear. Sclera non-icteric Neck: Supple.  Trachea midline Pulmonary:  Good air movement, no use of accessory muscles.  Cardiac: RRR, no JVD Vascular:  Vessel Right Left  Radial Palpable Palpable               Musculoskeletal: Mild right sided weakness at this point.  No deformity or atrophy.  No edema. Neurologic: Sensation grossly intact in extremities.  Mild right sided weakness.  Speech is difficult to discern as he still has significant expressive aphasia. Psychiatric: Judgment intact, Mood & affect appropriate for pt's clinical situation. Dermatologic: No rashes or ulcers noted.  No cellulitis or open wounds.      Labs Recent Results (from the past 2160 hours)  HgB A1c     Status: Abnormal   Collection Time: 09/24/23 10:25 AM  Result Value Ref Range   Hgb A1c MFr Bld 7.0 (H) <5.7 %    Comment: For someone without known diabetes, a hemoglobin A1c value of 6.5% or greater indicates that they may have  diabetes and this should be confirmed with a follow-up  test. . For someone with known diabetes, a value <7% indicates  that their diabetes is well controlled and a value  greater than or equal to 7% indicates suboptimal  control. A1c targets should be individualized based on  duration of diabetes, age, comorbid  conditions, and  other considerations. . Currently, no consensus exists regarding use of hemoglobin A1c for diagnosis of diabetes for children. .    Mean Plasma Glucose 154 mg/dL   eAG (mmol/L) 8.5 mmol/L  Comprehensive Metabolic Panel (CMET)     Status: Abnormal   Collection Time: 09/24/23 10:25 AM  Result Value Ref Range   Glucose, Bld 147 (H) 65 - 99 mg/dL    Comment: .            Fasting reference interval . For someone without known diabetes, a glucose value >125 mg/dL indicates that they may have diabetes and this should be confirmed with a follow-up test. .    BUN 13 7 - 25 mg/dL   Creat 9.14 9.29 - 8.71 mg/dL   eGFR 89 > OR = 60 fO/fpw/8.26f7   BUN/Creatinine Ratio SEE NOTE: 6 - 22 (calc)    Comment:    Not Reported: BUN and Creatinine are within    reference range. .    Sodium 140 135 - 146 mmol/L   Potassium 3.9 3.5 - 5.3 mmol/L   Chloride 103 98 - 110 mmol/L   CO2 27 20 - 32 mmol/L   Calcium 9.3 8.6 - 10.3 mg/dL   Total Protein 6.8 6.1 - 8.1 g/dL   Albumin 4.5 3.6 - 5.1 g/dL   Globulin 2.3 1.9 - 3.7 g/dL (calc)   AG Ratio 2.0 1.0 - 2.5 (calc)   Total Bilirubin 0.6 0.2 - 1.2 mg/dL   Alkaline phosphatase (APISO) 64 35 - 144 U/L   AST 25 10 - 35 U/L   ALT 48 (H) 9 - 46 U/L  Urine Microalbumin w/creat. ratio     Status:  None   Collection Time: 09/24/23 10:25 AM  Result Value Ref Range   Creatinine, Urine 243 20 - 320 mg/dL   Microalb, Ur 1.3 mg/dL    Comment: Reference Range Not established    Microalb Creat Ratio 5 <30 mg/g creat    Comment: . The ADA defines abnormalities in albumin excretion as follows: SABRA Albuminuria Category        Result (mg/g creatinine) . Normal to Mildly increased   <30 Moderately increased         30-299  Severely increased           > OR = 300 . The ADA recommends that at least two of three specimens collected within a 3-6 month period be abnormal before considering a patient to be within a diagnostic category.   CBC  with Differential/Platelet     Status: None   Collection Time: 09/24/23 10:25 AM  Result Value Ref Range   WBC 6.7 3.8 - 10.8 Thousand/uL   RBC 4.80 4.20 - 5.80 Million/uL   Hemoglobin 14.5 13.2 - 17.1 g/dL   HCT 56.3 61.4 - 49.9 %   MCV 90.8 80.0 - 100.0 fL   MCH 30.2 27.0 - 33.0 pg   MCHC 33.3 32.0 - 36.0 g/dL    Comment: For adults, a slight decrease in the calculated MCHC value (in the range of 30 to 32 g/dL) is most likely not clinically significant; however, it should be interpreted with caution in correlation with other red cell parameters and the patient's clinical condition.    RDW 12.2 11.0 - 15.0 %   Platelets 222 140 - 400 Thousand/uL   MPV 9.5 7.5 - 12.5 fL   Neutro Abs 3,404 1,500 - 7,800 cells/uL   Absolute Lymphocytes 2,419 850 - 3,900 cells/uL   Absolute Monocytes 570 200 - 950 cells/uL   Eosinophils Absolute 228 15 - 500 cells/uL   Basophils Absolute 80 0 - 200 cells/uL   Neutrophils Relative % 50.8 %   Total Lymphocyte 36.1 %   Monocytes Relative 8.5 %   Eosinophils Relative 3.4 %   Basophils Relative 1.2 %  Lipid panel     Status: Abnormal   Collection Time: 09/24/23 10:25 AM  Result Value Ref Range   Cholesterol 244 (H) <200 mg/dL   HDL 44 > OR = 40 mg/dL   Triglycerides 837 (H) <150 mg/dL   LDL Cholesterol (Calc) 169 (H) mg/dL (calc)    Comment: Reference range: <100 . Desirable range <100 mg/dL for primary prevention;   <70 mg/dL for patients with CHD or diabetic patients  with > or = 2 CHD risk factors. SABRA LDL-C is now calculated using the Martin-Hopkins  calculation, which is a validated novel method providing  better accuracy than the Friedewald equation in the  estimation of LDL-C.  Gladis APPLETHWAITE et al. SANDREA. 7986;689(80): 2061-2068  (http://education.[facility].com/faq/FAQ164)    Total CHOL/HDL Ratio 5.5 (H) <5.0 (calc)   Non-HDL Cholesterol (Calc) 200 (H) <130 mg/dL (calc)    Comment: For patients with diabetes plus 1 major ASCVD risk   factor, treating to a non-HDL-C goal of <100 mg/dL  (LDL-C of <29 mg/dL) is considered a therapeutic  option.     Radiology No results found.  Assessment/Plan  Carotid stenosis, left Carotid duplex today reveals stable 1 to 39% right ICA stenosis and a widely patent left carotid stent.  No new symptoms after his previous remote stroke prior to his intervention last year.  Continue aspirin , Plavix ,  and Repatha  as he is statin intolerant.  Follow-up in 6 months.  Hypertension associated with type 2 diabetes mellitus (HCC) blood pressure control important in reducing the progression of atherosclerotic disease. On appropriate oral medications.   Type 2 diabetes mellitus without complication, without long-term current use of insulin  (HCC) blood glucose control important in reducing the progression of atherosclerotic disease. Also, involved in wound healing. On appropriate medications.   Hyperlipidemia associated with type 2 diabetes mellitus (HCC) lipid control important in reducing the progression of atherosclerotic disease. Continue Repatha  therapy. Statin intolerant.    Selinda Gu, MD  11/12/2023 2:13 PM    This note was created with Dragon medical transcription system.  Any errors from dictation are purely unintentional

## 2023-11-12 NOTE — Assessment & Plan Note (Signed)
 blood glucose control important in reducing the progression of atherosclerotic disease. Also, involved in wound healing. On appropriate medications.

## 2023-11-12 NOTE — Assessment & Plan Note (Signed)
 Carotid duplex today reveals stable 1 to 39% right ICA stenosis and a widely patent left carotid stent.  No new symptoms after his previous remote stroke prior to his intervention last year.  Continue aspirin , Plavix , and Repatha  as he is statin intolerant.  Follow-up in 6 months.

## 2023-11-12 NOTE — Assessment & Plan Note (Signed)
 blood pressure control important in reducing the progression of atherosclerotic disease. On appropriate oral medications.

## 2023-11-12 NOTE — Assessment & Plan Note (Signed)
 lipid control important in reducing the progression of atherosclerotic disease. Continue Repatha  therapy. Statin intolerant.

## 2024-01-06 ENCOUNTER — Other Ambulatory Visit: Payer: Self-pay | Admitting: Family Medicine

## 2024-01-06 DIAGNOSIS — E1165 Type 2 diabetes mellitus with hyperglycemia: Secondary | ICD-10-CM

## 2024-01-06 DIAGNOSIS — T466X5A Adverse effect of antihyperlipidemic and antiarteriosclerotic drugs, initial encounter: Secondary | ICD-10-CM

## 2024-01-06 DIAGNOSIS — I6523 Occlusion and stenosis of bilateral carotid arteries: Secondary | ICD-10-CM

## 2024-01-06 DIAGNOSIS — E1169 Type 2 diabetes mellitus with other specified complication: Secondary | ICD-10-CM

## 2024-01-08 ENCOUNTER — Other Ambulatory Visit: Payer: Self-pay | Admitting: Family Medicine

## 2024-01-08 DIAGNOSIS — E1165 Type 2 diabetes mellitus with hyperglycemia: Secondary | ICD-10-CM

## 2024-01-08 NOTE — Telephone Encounter (Signed)
 Copied from CRM [id number]. Topic: Clinical - Medication Refill >> Jan 08, 2024  4:01 PM Everette C wrote: Medication: diabetic testing supplies (Contour or Accuchek)   Has the patient contacted their pharmacy? No (Agent: If no, request that the patient contact the pharmacy for the refill. If patient does not wish to contact the pharmacy document the reason why and proceed with request.) (Agent: If yes, when and what did the pharmacy advise?)  This is the patient's preferred pharmacy:  [facility] [place] [place] - [address] [place] [address] Phone: [phone] Fax: [phone]  Is this the correct pharmacy for this prescription? Yes If no, delete pharmacy and type the correct one.   Has the prescription been filled recently? Yes  Is the patient out of the medication? Yes  Has the patient been seen for an appointment in the last year OR does the patient have an upcoming appointment? Yes  Can we respond through MyChart? No  Agent: Please be advised that Rx refills may take up to 3 business days. We ask that you follow-up with your pharmacy.

## 2024-01-09 NOTE — Telephone Encounter (Signed)
 Too soon for refill, LRF 08/28/23 FOR 90 AND 1 RF.  Requested Prescriptions  Pending Prescriptions Disp Refills   metFORMIN  (GLUCOPHAGE -XR) 500 MG 24 hr tablet [Pharmacy Med Name: metFORMIN  HCl ER 500 MG Oral Tablet Extended Release 24 Hour] 180 tablet 3    Sig: TAKE 2 TABLETS BY MOUTH DAILY  WITH BREAKFAST     Endocrinology:  Diabetes - Biguanides Failed - 01/09/2024 10:58 AM      Failed - B12 Level in normal range and within 720 days    No results found for: VITAMINB12       Passed - Cr in normal range and within 360 days    Creat  Date Value Ref Range Status  09/24/2023 0.85 0.70 - 1.28 mg/dL Final   Creatinine, Urine  Date Value Ref Range Status  09/24/2023 243 20 - 320 mg/dL Final         Passed - HBA1C is between 0 and 7.9 and within 180 days    Hemoglobin A1C  Date Value Ref Range Status  05/07/2022 6.4  Final    Comment:    House call report   Hgb A1c MFr Bld  Date Value Ref Range Status  09/24/2023 7.0 (H) <5.7 % Final    Comment:    For someone without known diabetes, a hemoglobin A1c value of 6.5% or greater indicates that they may have  diabetes and this should be confirmed with a follow-up  test. . For someone with known diabetes, a value <7% indicates  that their diabetes is well controlled and a value  greater than or equal to 7% indicates suboptimal  control. A1c targets should be individualized based on  duration of diabetes, age, comorbid conditions, and  other considerations. . Currently, no consensus exists regarding use of hemoglobin A1c for diagnosis of diabetes for children. .          Passed - eGFR in normal range and within 360 days    GFR, Est African American  Date Value Ref Range Status  04/25/2020 99 > OR = 60 mL/min/1.3m2 Final   GFR, Est Non African American  Date Value Ref Range Status  04/25/2020 85 > OR = 60 mL/min/1.50m2 Final   GFR, Estimated  Date Value Ref Range Status  01/01/2023 >60 >60 mL/min Final    Comment:     (NOTE) Calculated using the CKD-EPI Creatinine Equation (2021)    eGFR  Date Value Ref Range Status  09/24/2023 89 > OR = 60 mL/min/1.89m2 Final         Passed - Valid encounter within last 6 months    Recent Outpatient Visits           4 months ago Hyperlipidemia associated with type 2 diabetes mellitus [facility])   [facility] Leavy Mole, PA-C   7 months ago Uncontrolled diabetes mellitus with hyperglycemia, without long-term current use of insulin  [facility])   [facility] [place], [place], PA-C              Passed - CBC within normal limits and completed in the last 12 months    WBC  Date Value Ref Range Status  09/24/2023 6.7 3.8 - 10.8 Thousand/uL Final   RBC  Date Value Ref Range Status  09/24/2023 4.80 4.20 - 5.80 Million/uL Final   Hemoglobin  Date Value Ref Range Status  09/24/2023 14.5 13.2 - 17.1 g/dL Final  89/72/7979 84.8 13.0 - 17.7 g/dL Final   HCT  Date Value Ref Range Status  09/24/2023 43.6 38.5 - 50.0 % Final   Hematocrit  Date Value Ref Range Status  01/20/2019 43.5 37.5 - 51.0 % Final   MCHC  Date Value Ref Range Status  09/24/2023 33.3 32.0 - 36.0 g/dL Final    Comment:    For adults, a slight decrease in the calculated MCHC value (in the range of 30 to 32 g/dL) is most likely not clinically significant; however, it should be interpreted with caution in correlation with other red cell parameters and the patient's clinical condition.    [facility]  Date Value Ref Range Status  09/24/2023 30.2 27.0 - 33.0 pg Final   MCV  Date Value Ref Range Status  09/24/2023 90.8 80.0 - 100.0 fL Final  01/20/2019 88 79 - 97 fL Final   No results found for: PLTCOUNTKUC, LABPLAT, POCPLA RDW  Date Value Ref Range Status  09/24/2023 12.2 11.0 - 15.0 % Final  01/20/2019 11.8 11.6 - 15.4 % Final          Evolocumab  (REPATHA  SURECLICK) 140 MG/ML SOAJ [Pharmacy Med Name: Repatha  SureClick 140 MG/ML  Subcutaneous Solution Auto-injector] 6 mL 3    Sig: INJECT 1 PEN SUBCUTANEOUSLY  EVERY 2 WEEKS     Cardiovascular: PCSK9 Inhibitors Passed - 01/09/2024 10:58 AM      Passed - Valid encounter within last 12 months    Recent Outpatient Visits           4 months ago Hyperlipidemia associated with type 2 diabetes mellitus [facility])   [facility] [place], Michel[place], PA-C   7 months ago Uncontrolled diabetes mellitus with hyperglycemia, without long-term current use of insulin  [facility])   [facility] [place], Michelene, PA-C              Passed - Lipid Panel completed within the last 12 months    Cholesterol, Total  Date Value Ref Range Status  08/10/2019 122 100 - 199 mg/dL Final   Cholesterol  Date Value Ref Range Status  09/24/2023 244 (H) <200 mg/dL Final   LDL Cholesterol (Calc)  Date Value Ref Range Status  09/24/2023 169 (H) mg/dL (calc) Final    Comment:    Reference range: <100 . Desirable range <100 mg/dL for primary prevention;   <70 mg/dL for patients with CHD or diabetic patients  with > or = 2 CHD risk factors. SABRA LDL-C is now calculated using the Martin-Hopkins  calculation, which is a validated novel method providing  better accuracy than the Friedewald equation in the  estimation of LDL-C.  Gladis APPLETHWAITE et al. SANDREA. 7986;689(80): 2061-2068  (http://education.[facility].com/faq/FAQ164)    HDL  Date Value Ref Range Status  09/24/2023 44 > OR = 40 mg/dL Final  94/82/7978 48 >60 mg/dL Final   Triglycerides  Date Value Ref Range Status  09/24/2023 162 (H) <150 mg/dL Final

## 2024-01-10 MED ORDER — FREESTYLE LIBRE 3 READER DEVI: 0 refills | Status: AC

## 2024-01-10 NOTE — Telephone Encounter (Signed)
 Called patient to confirm testing supplies needed- note states different request than Rx refill- left message to call office to confirm.

## 2024-01-10 NOTE — Telephone Encounter (Signed)
 Requested Prescriptions  Pending Prescriptions Disp Refills   Continuous Glucose Receiver (FREESTYLE LIBRE 3 READER) DEVI 1 each 0    Sig: Use as directed with libre 3 sensors for continuous glucose monitoring     Endocrinology: Diabetes - Testing Supplies Passed - 01/10/2024 12:43 PM      Passed - Valid encounter within last 12 months    Recent Outpatient Visits           4 months ago Hyperlipidemia associated with type 2 diabetes mellitus [facility])   [facility] Leavy Mole, PA-C   7 months ago Uncontrolled diabetes mellitus with hyperglycemia, without long-term current use of insulin  [facility])   [facility] Leavy Mole, PA-C

## 2024-01-22 ENCOUNTER — Other Ambulatory Visit: Payer: Self-pay | Admitting: Family Medicine

## 2024-01-22 DIAGNOSIS — E1165 Type 2 diabetes mellitus with hyperglycemia: Secondary | ICD-10-CM

## 2024-01-23 ENCOUNTER — Ambulatory Visit: Payer: Medicare Other

## 2024-01-23 DIAGNOSIS — Z0000 Encounter for general adult medical examination without abnormal findings: Secondary | ICD-10-CM

## 2024-01-23 NOTE — Patient Instructions (Addendum)
 [name],  Thank you for taking the time for your Medicare Wellness Visit. I appreciate your continued commitment to your health goals. Please review the care plan we discussed, and feel free to reach out if I can assist you further.  Medicare recommends these wellness visits once per year to help you and your care team stay ahead of potential health issues. These visits are designed to focus on prevention, allowing your provider to concentrate on managing your acute and chronic conditions during your regular appointments.  Please note that Annual Wellness Visits do not include a physical exam. Some assessments may be limited, especially if the visit was conducted virtually. If needed, we may recommend a separate in-person follow-up with your provider.  Ongoing Care Seeing your primary care provider every 3 to 6 months helps us  monitor your health and provide consistent, personalized care.   Referrals If a referral was made during today's visit and you haven't received any updates within two weeks, please contact the referred provider directly to check on the status.  Recommended Screenings:  Health Maintenance  Topic Date Due   COVID-19 Vaccine (2 - Janssen risk series) 10/06/2019   Flu Shot  10/25/2023   DTaP/Tdap/Td vaccine (2 - Tdap) 03/04/2024   Eye exam for diabetics  03/21/2024   Hemoglobin A1C  03/26/2024   Complete foot exam   05/30/2024   Yearly kidney function blood test for diabetes  09/23/2024   Yearly kidney health urinalysis for diabetes  09/23/2024   Medicare Annual Wellness Visit  01/22/2025   Pneumococcal Vaccine for age over 2  Completed   Hepatitis C Screening  Completed   Zoster (Shingles) Vaccine  Completed   Meningitis B Vaccine  Aged Out   Colon Cancer Screening  Discontinued     Advance Care Planning is important because it: Ensures you receive medical care that aligns with your values, goals, and preferences. Provides guidance to your family and loved  ones, reducing the emotional burden of decision-making during critical moments.  Vision: Annual vision screenings are recommended for early detection of glaucoma, cataracts, and diabetic retinopathy. These exams can also reveal signs of chronic conditions such as diabetes and high blood pressure.  Dental: Annual dental screenings help detect early signs of oral cancer, gum disease, and other conditions linked to overall health, including heart disease and diabetes.  Please see the attached documents for additional preventive care recommendations.   NEXT AWV 01/28/25 @ 1:20 PM IN PERSON

## 2024-01-23 NOTE — Progress Notes (Signed)
 Subjective:   Gerald Bauer is a 78 y.o. who presents for a Medicare Wellness preventive visit.  As a reminder, Annual Wellness Visits don't include a physical exam, and some assessments may be limited, especially if this visit is performed virtually. We may recommend an in-person follow-up visit with your provider if needed.  Visit Complete: Virtual I connected with  Gerald Bauer on 01/23/24 by a audio enabled telemedicine application and verified that I am speaking with the correct person using two identifiers.  Patient Location: Home  Provider Location: Home Office  I discussed the limitations of evaluation and management by telemedicine. The patient expressed understanding and agreed to proceed.  Vital Signs: Because this visit was a virtual/telehealth visit, some criteria may be missing or patient reported. Any vitals not documented were not able to be obtained and vitals that have been documented are patient reported.  VideoDeclined- This patient declined Librarian, academic. Therefore the visit was completed with audio only.  Persons Participating in Visit: Patient.  AWV Questionnaire: No: Patient Medicare AWV questionnaire was not completed prior to this visit.  Cardiac Risk Factors include: advanced age (>57men, >42 women);diabetes mellitus;dyslipidemia;hypertension;male gender;sedentary lifestyle     Objective:    There were no vitals filed for this visit. There is no height or weight on file to calculate BMI.     01/23/2024    2:48 PM 01/17/2023    2:53 PM 12/31/2022   12:00 PM 12/31/2022    8:28 AM 11/06/2021    2:09 PM 10/09/2021    3:00 PM 10/08/2021    7:27 AM  Advanced Directives  Does Patient Have a Medical Advance Directive? No Yes Yes Yes Yes Yes   Type of Special Educational Needs Teacher of [place];Living will Healthcare Power of [employer] Power of [employer] Power of [employer] Power of  Attorney   Does patient want to make changes to medical advance directive?   No - Patient declined      Copy of Healthcare Power of Attorney in Chart?  No - copy requested No - copy requested      Would patient like information on creating a medical advance directive? No - Patient declined      No - Patient declined    Current Medications (verified) Outpatient Encounter Medications as of 01/23/2024  Medication Sig   acetaminophen  (TYLENOL ) 325 MG tablet Take 2 tablets (650 mg total) by mouth every 4 (four) hours as needed for mild pain (or temp > 37.5 C (99.5 F)).   amLODipine  (NORVASC ) 5 MG tablet Take 1 tablet (5 mg total) by mouth daily.   aspirin  EC 81 MG tablet Take 81 mg by mouth daily. Swallow whole.   Cholecalciferol  (VITAMIN D3) 25 MCG (1000 UT) CAPS Take 1,000 Units by mouth daily.   clopidogrel  (PLAVIX ) 75 MG tablet Take 1 tablet (75 mg total) by mouth daily.   Continuous Glucose Receiver (FREESTYLE LIBRE 3 READER) DEVI Use as directed with libre 3 sensors for continuous glucose monitoring   Continuous Glucose Sensor (FREESTYLE LIBRE 3 SENSOR) MISC 1 Device by Does not apply route every 14 (fourteen) days.   Cyanocobalamin  (VITAMIN B12) 500 MCG TABS Take 500 mcg by mouth daily.   Evolocumab  (REPATHA  SURECLICK) 140 MG/ML SOAJ Inject 140 mg Subcutaneously every 2 weeks.   Lancets (ONETOUCH DELICA PLUS LANCET33G) MISC USE WITH METER TO CHECK BLOOD  GLUCOSE ONCE DAILY AS DIRECTED   metFORMIN  (GLUCOPHAGE -XR) 500 MG 24 hr tablet  Take 2 tablets (1,000 mg total) by mouth daily with breakfast.   ONETOUCH VERIO test strip CHECK FINGERSTICK BLOOD SUGARS  ONCE DAILY   pantoprazole  (PROTONIX ) 20 MG tablet Take 1 tablet (20 mg total) by mouth every morning for 28 days. One hour before breakfast   No facility-administered encounter medications on file as of 01/23/2024.    Allergies (verified) Amoxicillin, Statins, and Welchol [colesevelam hcl]   History: Past Medical History:  Diagnosis  Date   Acid reflux    Aortic atherosclerosis    a. Noted on CT Abd in 2012.   Arthritis    hands   Carotid atherosclerosis    a. 02/2018 U/S: RICA 1-39%, LICA 40-59%; b. 09/2021 MRA Head/Neck: near complete occlusion L ICA and L MCA. Patent RICA.   Diabetes mellitus without complication (HCC)    Diastolic dysfunction    a. 09/2021 Echo: EF 55-60%, no rwma, GrI DD, nl RV fxn, RVSP 14.28mmHg.   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 09/2021 MRI/MRA Brain: Acute L MCA distribution infarct. Near complete occlusion L ICA and MCA.   Tachycardia    Tinea pedis of both feet 01/10/2017   Past Surgical History:  Procedure Laterality Date   APPENDECTOMY  1973   CAROTID PTA/STENT INTERVENTION Left 12/31/2022   Procedure: CAROTID PTA/STENT INTERVENTION;  Surgeon: Marea Selinda RAMAN, MD;  Location: [facility] INVASIVE CV LAB;  Service: Cardiovascular;  Laterality: Left;   CHOLECYSTECTOMY  2013   PROSTATECTOMY  2012   Family History  Problem Relation Age of Onset   Dementia Mother    High Cholesterol Mother    Hypertension Mother    Arthritis Mother    Hyperlipidemia Mother    Heart disease Father    Heart attack Father    Arthritis Maternal Grandmother    Cancer Maternal Grandmother        colon   Prostate cancer Brother    Cancer Brother        prostate   Cancer Maternal Uncle        colon   COPD Maternal Uncle    Stroke Paternal Grandmother    Diabetes Paternal Grandmother    Aneurysm Paternal Grandfather    Social History   Socioeconomic History   Marital status: Widowed    Spouse name: Kenneth   Number of children: 2   Years of education: some college   Highest education level: Some college,  no degree  Occupational History   Occupation: Retired    Comment: [employer]  Tobacco Use   Smoking status: Former    Current packs/day: 0.00    Average packs/day: 1 pack/day for 40.0 years (40.0 ttl pk-yrs)    Types: Cigarettes    Start date: 03/09/1957    Quit date: 03/09/1997    Years since quitting: 26.8   Smokeless tobacco: Former   Tobacco comments:    smoking cessation materials not required  Vaping Use   Vaping status: Never Used  Substance and Sexual Activity   Alcohol use: No    Alcohol/week: 0.0 standard drinks of alcohol   Drug use: No   Sexual activity: Never  Other Topics  Concern   Not on file  Social History Narrative   Pt lives alone   Social Drivers of Health   Financial Resource Strain: Low Risk  (01/23/2024)   Overall Financial Resource Strain (CARDIA)    Difficulty of Paying Living Expenses: Not hard at all  Food Insecurity: No Food Insecurity (01/23/2024)   Hunger Vital Sign    Worried About Running Out of Food in the Last Year: Never true    Ran Out of Food in the Last Year: Never true  Transportation Needs: No Transportation Needs (01/23/2024)   PRAPARE - Administrator, Civil Service (Medical): No    Lack of Transportation (Non-Medical): No  Physical Activity: Sufficiently Active (01/23/2024)   Exercise Vital Sign    Days of Exercise per Week: 7 days    Minutes of Exercise per Session: 50 min  Stress: No Stress Concern Present (01/23/2024)   [employer] of Occupational Health - Occupational Stress Questionnaire    Feeling of Stress: Not at all  Social Connections: Socially Isolated (01/23/2024)   Social Connection and Isolation Panel    Frequency of Communication with Friends and Family: Once a week    Frequency of Social Gatherings with Friends and Family: Twice a week    Attends Religious Services: Never    Database Administrator or Organizations: No    Attends Banker Meetings: Never    Marital Status:  Widowed    Tobacco Counseling Counseling given: Not Answered Tobacco comments: smoking cessation materials not required    Clinical Intake:  Pre-visit preparation completed: Yes  Pain : No/denies pain     BMI - recorded: 25.8 Nutritional Status: BMI 25 -29 Overweight Nutritional Risks: None Diabetes: Yes CBG done?: No Did pt. bring in CBG monitor from home?: No  Lab Results  Component Value Date   HGBA1C 7.0 (H) 09/24/2023   HGBA1C 7.1 (A) 05/31/2023   HGBA1C 8.2 (H) 02/27/2023     How often do you need to have someone help you when you read instructions, pamphlets, or other written materials from your doctor or pharmacy?: 1 - Never  Interpreter Needed?: No  Information entered by :: JHONNIE DAS, LPN   Activities of Daily Living    01/23/2024    2:49 PM 03/01/2023    1:24 PM  In your present state of health, do you have any difficulty performing the following activities:  Hearing? 0 1  Vision? 0 0  Difficulty concentrating or making decisions? 0 1  Walking or climbing stairs? 0 1  Dressing or bathing? 0 0  Doing errands, shopping? 1 1  Preparing Food and eating ? N   Using the Toilet? N   In the past six months, have you accidently leaked urine? N   Do you have problems with loss of bowel control? N   Managing your Medications? Y   Managing your Finances? Y   Housekeeping or managing your Housekeeping? N     Patient Care Team: Leavy Mole, PA-C as PCP - General (Family Medicine) Perla Evalene PARAS, MD as PCP - Cardiology (Cardiology) Perla Evalene PARAS, MD as Consulting Physician (Cardiology) Pa, [facility] (Optometry)  I have updated your Care Teams any recent Medical Services you may have received from other providers in the past year.     Assessment:   This is a routine wellness examination for Gerald Bauer.  Hearing/Vision screen Hearing Screening - Comments:: NO AIDS Vision Screening - Comments:: READERS- [facility]- SEEN  EVERY YEAR    Goals Addressed             This Visit's Progress    DIET - EAT MORE FRUITS AND VEGETABLES         Depression Screen     01/23/2024    2:46 PM 03/01/2023    1:28 PM 01/17/2023    2:47 PM 10/30/2022   10:38 AM 08/08/2022   10:27 AM 02/07/2022    1:27 PM 12/15/2021   11:27 AM  PHQ 2/9 Scores  PHQ - 2 Score 0 0 2 2 0 1 0  PHQ- 9 Score 0 4 4 2  0 1 0    Fall Risk     01/23/2024    2:49 PM 03/01/2023    1:24 PM 01/17/2023    2:42 PM 10/30/2022   10:38 AM 08/08/2022   10:27 AM  Fall Risk   Falls in the past year? 1 1 1 1 1   Number falls in past yr: 1 1 1 1 1   Injury with Fall? 0 1 1 0 1  Risk for fall due to : Impaired balance/gait Impaired balance/gait;Impaired mobility Impaired mobility;Impaired balance/gait;History of fall(s) Impaired balance/gait   Follow up Falls evaluation completed;Falls prevention discussed Falls prevention discussed Education provided;Falls prevention discussed Falls prevention discussed;Education provided;Falls evaluation completed     MEDICARE RISK AT HOME:  Medicare Risk at Home Any stairs in or around the home?: Yes If so, are there any without handrails?: No Home free of loose throw rugs in walkways, pet beds, electrical cords, etc?: Yes Adequate lighting in your home to reduce risk of falls?: Yes Life alert?: No Use of a cane, walker or w/c?: Yes (CANE ALL THE TIME) Grab bars in the bathroom?: Yes Shower chair or bench in shower?: Yes Elevated toilet seat or a handicapped toilet?: Yes  TIMED UP AND GO:  Was the test performed?  No  Cognitive Function: 6CIT completed    03/09/2014    1:41 PM  MMSE - Mini Mental State Exam  Orientation to time 5   Orientation to Place 4   Registration 3   Attention/ Calculation 5   Recall 2   Language- name 2 objects 2   Language- repeat 1  Language- follow 3 step command 3   Language- read & follow direction 1   Write a sentence 1   Copy design 1   Total score 28      Data saved with a previous  flowsheet row definition        01/23/2024    2:52 PM 01/17/2023    2:56 PM 09/10/2019    1:55 PM 09/09/2018    1:38 PM 09/03/2017    1:24 PM  6CIT Screen  What Year? 4 points 4 points 0 points 0 points 0 points  What month? 0 points 3 points 0 points 0 points 0 points  What time? 3 points 0 points  0 points 0 points  Count back from 20 0 points 0 points 0 points 0 points 0 points  Months in reverse 4 points 0 points 0 points 0 points 0 points  Repeat phrase 2 points 0 points 0 points 0 points 2 points  Total Score 13 points 7 points  0 points 2 points    Immunizations Immunization History  Administered Date(s) Administered   Fluad Quad(high Dose 65+) 01/20/2019, 04/25/2020, 01/23/2021   Fluad Trivalent(High Dose 65+) 01/01/2023   INFLUENZA, HIGH DOSE SEASONAL PF 01/10/2017, 01/13/2018   Influenza-Unspecified 01/10/2012,  01/01/2013, 01/01/2015   Janssen (J&J) SARS-COV-2 Vaccination 09/08/2019   Pneumococcal Conjugate-13 07/30/2013   Pneumococcal Polysaccharide-23 02/27/2011   Td 03/04/2014   Zoster Recombinant(Shingrix) 07/01/2016, 11/22/2016, 01/31/2017   Zoster, Live 02/17/2014    Screening Tests Health Maintenance  Topic Date Due   COVID-19 Vaccine (2 - Janssen risk series) 10/06/2019   Influenza Vaccine  10/25/2023   DTaP/Tdap/Td (2 - Tdap) 03/04/2024   OPHTHALMOLOGY EXAM  03/21/2024   HEMOGLOBIN A1C  03/26/2024   FOOT EXAM  05/30/2024   Diabetic kidney evaluation - eGFR measurement  09/23/2024   Diabetic kidney evaluation - Urine ACR  09/23/2024   Medicare Annual Wellness (AWV)  01/22/2025   Pneumococcal Vaccine: 50+ Years  Completed   Hepatitis C Screening  Completed   Zoster Vaccines- Shingrix  Completed   Meningococcal B Vaccine  Aged Out   Colonoscopy  Discontinued    Health Maintenance Items Addressed: UTD ON SHOTS; AGED OUT OF COLONOSCOPY  Additional Screening:  Vision Screening: Recommended annual ophthalmology exams for early detection of glaucoma  and other disorders of the eye. Is the patient up to date with their annual eye exam?  Yes  Who is the provider or what is the name of the office in which the patient attends annual eye exams? [facility]  Dental Screening: Recommended annual dental exams for proper oral hygiene  Community Resource Referral / Chronic Care Management: CRR required this visit?  No   CCM required this visit?  No   Plan:    I have personally reviewed and noted the following in the patient's chart:   Medical and social history Use of alcohol, tobacco or illicit drugs  Current medications and supplements including opioid prescriptions. Patient is not currently taking opioid prescriptions. Functional ability and status Nutritional status Physical activity Advanced directives List of other physicians Hospitalizations, surgeries, and ER visits in previous 12 months Vitals Screenings to include cognitive, depression, and falls Referrals and appointments  In addition, I have reviewed and discussed with patient certain preventive protocols, quality metrics, and best practice recommendations. A written personalized care plan for preventive services as well as general preventive health recommendations were provided to patient.   Jhonnie GORMAN Das, LPN   89/69/7974   After Visit Summary: (MyChart) Due to this being a telephonic visit, the after visit summary with patients personalized plan was offered to patient via MyChart   Notes: Nothing significant to report at this time.

## 2024-01-24 NOTE — Telephone Encounter (Signed)
 Requested Prescriptions  Pending Prescriptions Disp Refills   metFORMIN  (GLUCOPHAGE -XR) 500 MG 24 hr tablet [Pharmacy Med Name: metFORMIN  HCl ER 500 MG Oral Tablet Extended Release 24 Hour] 180 tablet 0    Sig: TAKE 2 TABLETS BY MOUTH DAILY  WITH BREAKFAST     Endocrinology:  Diabetes - Biguanides Failed - 01/24/2024  1:48 PM      Failed - B12 Level in normal range and within 720 days    No results found for: VITAMINB12       Passed - Cr in normal range and within 360 days    Creat  Date Value Ref Range Status  09/24/2023 0.85 0.70 - 1.28 mg/dL Final   Creatinine, Urine  Date Value Ref Range Status  09/24/2023 243 20 - 320 mg/dL Final         Passed - HBA1C is between 0 and 7.9 and within 180 days    Hemoglobin A1C  Date Value Ref Range Status  05/07/2022 6.4  Final    Comment:    House call report   Hgb A1c MFr Bld  Date Value Ref Range Status  09/24/2023 7.0 (H) <5.7 % Final    Comment:    For someone without known diabetes, a hemoglobin A1c value of 6.5% or greater indicates that they may have  diabetes and this should be confirmed with a follow-up  test. . For someone with known diabetes, a value <7% indicates  that their diabetes is well controlled and a value  greater than or equal to 7% indicates suboptimal  control. A1c targets should be individualized based on  duration of diabetes, age, comorbid conditions, and  other considerations. . Currently, no consensus exists regarding use of hemoglobin A1c for diagnosis of diabetes for children. .          Passed - eGFR in normal range and within 360 days    GFR, Est African American  Date Value Ref Range Status  04/25/2020 99 > OR = 60 mL/min/1.54m2 Final   GFR, Est Non African American  Date Value Ref Range Status  04/25/2020 85 > OR = 60 mL/min/1.65m2 Final   GFR, Estimated  Date Value Ref Range Status  01/01/2023 >60 >60 mL/min Final    Comment:    (NOTE) Calculated using the CKD-EPI Creatinine  Equation (2021)    eGFR  Date Value Ref Range Status  09/24/2023 89 > OR = 60 mL/min/1.14m2 Final         Passed - Valid encounter within last 6 months    Recent Outpatient Visits           4 months ago Hyperlipidemia associated with type 2 diabetes mellitus [facility])   [place] [facility] Leavy Mole, PA-C   7 months ago Uncontrolled diabetes mellitus with hyperglycemia, without long-term current use of insulin  [facility])   [place] [facility] [place], [place], PA-C              Passed - CBC within normal limits and completed in the last 12 months    WBC  Date Value Ref Range Status  09/24/2023 6.7 3.8 - 10.8 Thousand/uL Final   RBC  Date Value Ref Range Status  09/24/2023 4.80 4.20 - 5.80 Million/uL Final   Hemoglobin  Date Value Ref Range Status  09/24/2023 14.5 13.2 - 17.1 g/dL Final  89/72/7979 84.8 13.0 - 17.7 g/dL Final   HCT  Date Value Ref Range Status  09/24/2023 43.6 38.5 - 50.0 %  Final   Hematocrit  Date Value Ref Range Status  01/20/2019 43.5 37.5 - 51.0 % Final   MCHC  Date Value Ref Range Status  09/24/2023 33.3 32.0 - 36.0 g/dL Final    Comment:    For adults, a slight decrease in the calculated MCHC value (in the range of 30 to 32 g/dL) is most likely not clinically significant; however, it should be interpreted with caution in correlation with other red cell parameters and the patient's clinical condition.    [facility]  Date Value Ref Range Status  09/24/2023 30.2 27.0 - 33.0 pg Final   MCV  Date Value Ref Range Status  09/24/2023 90.8 80.0 - 100.0 fL Final  01/20/2019 88 79 - 97 fL Final   No results found for: PLTCOUNTKUC, LABPLAT, POCPLA RDW  Date Value Ref Range Status  09/24/2023 12.2 11.0 - 15.0 % Final  01/20/2019 11.8 11.6 - 15.4 % Final

## 2024-02-18 ENCOUNTER — Telehealth: Payer: Self-pay | Admitting: Family Medicine

## 2024-02-18 NOTE — Telephone Encounter (Signed)
 Pt had appt sch'd with Leisa for December but it got cancelled. Therefore I spoke with pt daughter in law and rescheduled it with Brittany for 02/28/24 at 2:20

## 2024-02-18 NOTE — Telephone Encounter (Signed)
 Evolocumab  (REPATHA  SURECLICK) 140 MG/ML SOAJ

## 2024-02-28 ENCOUNTER — Ambulatory Visit: Admitting: Family Medicine

## 2024-03-05 ENCOUNTER — Telehealth: Payer: Self-pay | Admitting: Family Medicine

## 2024-03-05 NOTE — Telephone Encounter (Signed)
 Evolocumab  (REPATHA  SURECLICK) 140 MG/ML SOAJ

## 2024-03-05 NOTE — Telephone Encounter (Signed)
 Appt scheduled for next wed. 12/17

## 2024-03-11 ENCOUNTER — Encounter: Payer: Self-pay | Admitting: Family Medicine

## 2024-03-11 ENCOUNTER — Ambulatory Visit: Admitting: Family Medicine

## 2024-03-11 VITALS — BP 122/74 | HR 92 | Resp 16 | Ht 67.0 in | Wt 165.0 lb

## 2024-03-11 DIAGNOSIS — I152 Hypertension secondary to endocrine disorders: Secondary | ICD-10-CM

## 2024-03-11 DIAGNOSIS — I69391 Dysphagia following cerebral infarction: Secondary | ICD-10-CM

## 2024-03-11 DIAGNOSIS — I6523 Occlusion and stenosis of bilateral carotid arteries: Secondary | ICD-10-CM

## 2024-03-11 DIAGNOSIS — G720 Drug-induced myopathy: Secondary | ICD-10-CM | POA: Diagnosis not present

## 2024-03-11 DIAGNOSIS — K219 Gastro-esophageal reflux disease without esophagitis: Secondary | ICD-10-CM | POA: Diagnosis not present

## 2024-03-11 DIAGNOSIS — I69320 Aphasia following cerebral infarction: Secondary | ICD-10-CM

## 2024-03-11 DIAGNOSIS — E785 Hyperlipidemia, unspecified: Secondary | ICD-10-CM | POA: Diagnosis not present

## 2024-03-11 DIAGNOSIS — R42 Dizziness and giddiness: Secondary | ICD-10-CM

## 2024-03-11 DIAGNOSIS — I69359 Hemiplegia and hemiparesis following cerebral infarction affecting unspecified side: Secondary | ICD-10-CM

## 2024-03-11 DIAGNOSIS — Z7984 Long term (current) use of oral hypoglycemic drugs: Secondary | ICD-10-CM

## 2024-03-11 DIAGNOSIS — E1159 Type 2 diabetes mellitus with other circulatory complications: Secondary | ICD-10-CM | POA: Diagnosis not present

## 2024-03-11 DIAGNOSIS — E1165 Type 2 diabetes mellitus with hyperglycemia: Secondary | ICD-10-CM | POA: Diagnosis not present

## 2024-03-11 DIAGNOSIS — E1169 Type 2 diabetes mellitus with other specified complication: Secondary | ICD-10-CM | POA: Diagnosis not present

## 2024-03-11 MED ORDER — PANTOPRAZOLE SODIUM 20 MG PO TBEC: 20.0000 mg | DELAYED_RELEASE_TABLET | Freq: Every day | ORAL | 1 refills | Status: AC

## 2024-03-11 MED ORDER — REPATHA SURECLICK 140 MG/ML ~~LOC~~ SOAJ: SUBCUTANEOUS | 6 refills | Status: AC

## 2024-03-11 NOTE — Assessment & Plan Note (Addendum)
 Reinforced importance of medication adherence and lifestyle modifications for management of hyperlipidemia.  -Continue Repatha  140mg  subcutaneous injection every two weeks, refill provided -Labs updated. -Return for PCP follow up in 4 months -Maintain cardiology and vascular follow up as scheduled Orders:   Comprehensive Metabolic Panel (CMET)   Lipid Profile   Evolocumab  (REPATHA  SURECLICK) 140 MG/ML SOAJ; Inject 140 mg Subcutaneously every 2 weeks.

## 2024-03-11 NOTE — Assessment & Plan Note (Addendum)
 GERD controlled with current medication regimen. No changes made at time of visit, medication refill provided.  -Continue Pantoprazole  20mg  once daily. Refill provided Orders:   pantoprazole  (PROTONIX ) 20 MG tablet; Take 1 tablet (20 mg total) by mouth daily. One hour before breakfast

## 2024-03-11 NOTE — Assessment & Plan Note (Addendum)
 Follows with vascular due to bilateral carotid artery stenosis. Left carotid stenosis requiring stent placement. Stent placement 12/2022. Vascular advised to continue Aspirin , Plavix , and Repatha . Hx of statin intolerance, stain induced myopathy. Last vascular visit 10/2023  -Advised to maintain scheduled vascular follow up -Repatha  refilled as seen below. No changes to medication regimen.  Orders:   Evolocumab  (REPATHA  SURECLICK) 140 MG/ML SOAJ; Inject 140 mg Subcutaneously every 2 weeks.

## 2024-03-11 NOTE — Assessment & Plan Note (Deleted)
 SABRA

## 2024-03-11 NOTE — Progress Notes (Signed)
 Established Patient Office Visit  Subjective   Patient ID: Gerald Bauer, male    DOB: 07-Jan-1946  Age: 78 y.o. MRN: [id number]  Chief Complaint  Patient presents with   Medical Management of Chronic Issues    HPI [name] is a 78 year old male who returns for 6 month follow up for management of chronic conditions. He is accompanied by his daughter in law, [place], who participates in conversation during today's visit. He is a new patient to myself, however well established in office. He currently resides by himself, however his DIL voices there are cameras in the house that allow for increased monitoring and she comes to his residence to check him as well several days during the week.   He is established with several specialists including cardiology and vascular. Previously following with neurology due to hx of CVA with residual deficits, however last seen by neurology in 05/2022 and was directed to return only as needed. He confirms this. As noted, he continues with cardiology and vascular follow up.   During the visit he does voice concerns of dizziness. He states dizziness typically occurs once weekly lasting a few minutes. Denies dizziness with activity or position change, voicing that dizziness typically occurs while at rest in his chair. He voices dizziness relieved by taking a deep breath. He denies syncope or LOC. He denies associated chest pain, palpitations, headaches or shortness of breath.  Will note documented bilateral carotid stenosis with last vascular f/u in 10/2023 noting stable 1 to 39% right ICA stenosis and a widely patent left carotid stent after reviewing carotid duplex at time of visit. Will also note upcoming f/u with vascular on 05/12/24. Advised to maintain scheduled f/u.  During visit we reviewed his labs completed previously with PCP in 09/2023 that demonstrated uncontrolled hyperlipidemia. He reports he had not been taking Repatha  at time of visit and labs, but  has since resumed Repatha . He voices he has not missed any additional doses since 09/2023.  Will note hx of statin intolerance due stain induced myopathy. He reports he tolerates Repatha  well but voices injections due hurt.    Past Medical History:  Diagnosis Date   Acid reflux    Aortic atherosclerosis    a. Noted on CT Abd in 2012.   Arthritis    hands   Carotid atherosclerosis    a. 02/2018 U/S: RICA 1-39%, LICA 40-59%; b. 09/2021 MRA Head/Neck: near complete occlusion L ICA and L MCA. Patent RICA.   Diabetes mellitus without complication (HCC)    Diastolic dysfunction    a. 09/2021 Echo: EF 55-60%, no rwma, GrI DD, nl RV fxn, RVSP 14.64mmHg.   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 09/2021 MRI/MRA Brain: Acute L MCA distribution infarct. Near complete occlusion L ICA and MCA.   Tachycardia    Tinea pedis of both feet 01/10/2017    Past Surgical History:  Procedure Laterality Date   APPENDECTOMY  1973   CAROTID PTA/STENT INTERVENTION Left 12/31/2022   Procedure: CAROTID PTA/STENT INTERVENTION;  Surgeon: Marea Selinda RAMAN, MD;  Location: [facility];  Service: Cardiovascular;  Laterality:  Left;   CHOLECYSTECTOMY  2013   PROSTATECTOMY  2012     Review of Systems  Respiratory:  Negative for shortness of breath.   Cardiovascular:  Negative for chest pain and leg swelling.  Neurological:  Positive for dizziness. Negative for loss of consciousness and headaches.      Objective:     BP 122/74   Pulse 92   Resp 16   Ht 5' 7 (1.702 m)   Wt 165 lb (74.8 kg)   SpO2 99%   BMI 25.84 kg/m  BP Readings from Last 3 Encounters:  03/11/24 122/74  11/12/23 (!) 144/89  08/28/23  124/76   Wt Readings from Last 3 Encounters:  03/11/24 165 lb (74.8 kg)  11/12/23 165 lb 6.4 oz (75 kg)  08/28/23 165 lb (74.8 kg)      Physical Exam Constitutional:      General: He is not in acute distress.    Appearance: Normal appearance.  Cardiovascular:     Rate and Rhythm: Normal rate and regular rhythm.  Pulmonary:     Effort: Pulmonary effort is normal. No respiratory distress.     Breath sounds: Normal breath sounds.  Musculoskeletal:     Right lower leg: No edema.     Left lower leg: No edema.  Skin:    General: Skin is warm and dry.  Neurological:     Mental Status: He is alert. Mental status is at baseline.  Psychiatric:        Mood and Affect: Mood normal.        Behavior: Behavior normal.     Last CBC Lab Results  Component Value Date   WBC 6.7 09/24/2023   HGB 14.5 09/24/2023   HCT 43.6 09/24/2023   MCV 90.8 09/24/2023   MCH 30.2 09/24/2023   RDW 12.2 09/24/2023   PLT 222 09/24/2023   Last metabolic panel Lab Results  Component Value Date   GLUCOSE 147 (H) 09/24/2023   NA 140 09/24/2023   K 3.9 09/24/2023   CL 103 09/24/2023   CO2 27 09/24/2023   BUN 13 09/24/2023   CREATININE 0.85 09/24/2023   EGFR 89 09/24/2023   CALCIUM 9.3 09/24/2023   PROT 6.8 09/24/2023   ALBUMIN 3.3 (L) 10/10/2021   LABGLOB 2.6 01/20/2019   AGRATIO 1.6 01/20/2019   BILITOT 0.6 09/24/2023   ALKPHOS 50 10/10/2021   AST 25 09/24/2023   ALT 48 (H) 09/24/2023   ANIONGAP 4 (L) 01/01/2023   Last lipids Lab Results  Component Value Date   CHOL 244 (H) 09/24/2023   HDL 44 09/24/2023   LDLCALC 169 (H) 09/24/2023   TRIG 162 (H) 09/24/2023   CHOLHDL 5.5 (H) 09/24/2023   Last hemoglobin A1c Lab Results  Component Value Date   HGBA1C 7.0 (H) 09/24/2023   Last thyroid  functions Lab Results  Component Value Date   TSH 2.268 04/12/2022          Assessment & Plan:   Assessment & Plan Type 2 diabetes mellitus with hyperglycemia, without long-term current  use of insulin  (HCC) Type 2 DM managed with Metformin  . He reports he is taking two 500mg  Metformin  tablets once daily for total dose of 1,000mg  once daily. Previous issues reviewed in prior PCP notes with swallowing medication resulting in adjustment to 500mg  tablet, which he voices is working well for him. Denies other related concerns.  Last A1c 7 on 09/24/23, prevously A1c of 7.1 in 05/2023.  -Stressed importance of adequate control of  type 2 DM with other co-morbidities as noted -Discussed goal A1c of 7 or less -Reinforced importance of medication adherence -Reinforced importance of lifestyle modifications including dietary changes and increased exercise as tolerated -Request 4 month f/u to allow for additional diabetes monitoring/ A1c checks. He is agreeable.  Orders:   Comprehensive Metabolic Panel (CMET)   HgB A1c   Lipid Profile  Dizziness He complains of dizziness at today's visit. He voices episodes are typically once weekly lasting a few minutes, and patient reports resolves with deep breathing. Documented hx of CVA (09/2021) with observed residual deficits including aphasia.  Patient previously followed with neurology for CVA follow up, however last visit in 05/2022 and was advised that he could return as needed.   -Return precautions advised. Discussed if dizziness continues, worsens or new symptoms present it would be warranted to return to neurology for evaluation. He voices understanding. -Labs obtained today, see below. -Vascular follow up arranged for 04/2024 for f/u on bilateral carotid artery stenosis and this includes repeat ultrasound imaging from reviewing information in scheduled appointments. Advised to maintain scheduled vascular f/u for evaluation.  Orders:   CBC w/Diff/Platelet   Comprehensive Metabolic Panel (CMET)   TSH  Bilateral carotid artery stenosis Follows with vascular due to bilateral carotid artery stenosis. Left carotid stenosis requiring stent  placement. Stent placement 12/2022. Vascular advised to continue Aspirin , Plavix , and Repatha . Hx of statin intolerance, stain induced myopathy. Last vascular visit 10/2023  -Advised to maintain scheduled vascular follow up -Repatha  refilled as seen below. No changes to medication regimen.  Orders:   Evolocumab  (REPATHA  SURECLICK) 140 MG/ML SOAJ; Inject 140 mg Subcutaneously every 2 weeks.  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 At time of visit advised to continue with cardiology/vascular follow up and PCP follow up. Advised to continue Aspirin  and Plavix  ongoing   -Continue Plavix  75mg  PO once daily as prescribed. Last refilled at time of cardiology visit in 03/2023 -Continue Aspirin  81mg  daily. Patient gets medication OTC.  Orders:   CBC w/Diff/Platelet   Lipid Profile  Hypertension associated with type 2 diabetes mellitus (HCC) BP at goal (less than 140/90) today. HTN controlled. Continue antihypertensive medication regimen as prescribed. Amlodipine  refill last provided by cardiology when seen 03/2023.  -Continue Amlodipine  5mg  once daily as prescribed by cardiology -Maintain cardiology and vascular follow up  Orders:   Comprehensive Metabolic Panel (CMET)  Hyperlipidemia associated with type 2 diabetes mellitus (HCC) Reinforced importance of medication adherence and lifestyle modifications for management of hyperlipidemia.  -Continue Repatha  140mg  subcutaneous injection every two weeks, refill provided -Labs updated. -Return for PCP follow up in 4 months -Maintain cardiology and vascular follow up as scheduled Orders:   Comprehensive Metabolic Panel (CMET)   Lipid Profile   Evolocumab  (REPATHA  SURECLICK) 140 MG/ML SOAJ; Inject 140 mg Subcutaneously every 2 weeks.  Gastroesophageal reflux disease, unspecified whether esophagitis present GERD controlled with current medication regimen. No changes made  at time of visit, medication refill provided.  -Continue Pantoprazole  20mg  once daily. Refill provided Orders:   pantoprazole  (PROTONIX ) 20 MG tablet; Take 1 tablet (20 mg total) by mouth daily. 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 Continues Repatha  and tolerating medication well. Will note adequate lipid control important/desired with hx of CVA and other co-morbidities including type 2 DM, carotid artery disease and HTN.  Orders:   Evolocumab  (REPATHA  SURECLICK) 140 MG/ML SOAJ; Inject 140 mg Subcutaneously every 2  weeks.      Return in about 4 months (around 07/10/2024) for chronic condition follow up .    LAYMON LOISE CORE, FNP

## 2024-03-11 NOTE — Assessment & Plan Note (Addendum)
 Hx of statin induced myopathy. Continues Repatha  and tolerating medication well. Will note adequate lipid control important/desired with hx of CVA and other co-morbidities including type 2 DM, carotid artery disease and HTN.  Orders:   Evolocumab  (REPATHA  SURECLICK) 140 MG/ML SOAJ; Inject 140 mg Subcutaneously every 2 weeks.

## 2024-03-11 NOTE — Assessment & Plan Note (Addendum)
 Last visit with neurologoy 06/14/22, CVA 09/2021. At time of visit advised to continue with cardiology/vascular follow up and PCP follow up. Advised to continue Aspirin  and Plavix  ongoing   -Continue Plavix  75mg  PO once daily as prescribed. Last refilled at time of cardiology visit in 03/2023 -Continue Aspirin  81mg  daily. Patient gets medication OTC.  Orders:   CBC w/Diff/Platelet   Lipid Profile

## 2024-03-11 NOTE — Assessment & Plan Note (Addendum)
 BP at goal (less than 140/90) today. HTN controlled. Continue antihypertensive medication regimen as prescribed. Amlodipine  refill last provided by cardiology when seen 03/2023.  -Continue Amlodipine  5mg  once daily as prescribed by cardiology -Maintain cardiology and vascular follow up  Orders:   Comprehensive Metabolic Panel (CMET)

## 2024-03-12 ENCOUNTER — Ambulatory Visit: Payer: Self-pay | Admitting: Family Medicine

## 2024-03-12 LAB — COMPREHENSIVE METABOLIC PANEL WITH GFR
AG Ratio: 1.9 (calc) (ref 1.0–2.5)
ALT: 13 U/L (ref 9–46)
AST: 15 U/L (ref 10–35)
Albumin: 4.8 g/dL (ref 3.6–5.1)
Alkaline phosphatase (APISO): 63 U/L (ref 35–144)
BUN: 12 mg/dL (ref 7–25)
CO2: 26 mmol/L (ref 20–32)
Calcium: 9.6 mg/dL (ref 8.6–10.3)
Chloride: 102 mmol/L (ref 98–110)
Creat: 0.87 mg/dL (ref 0.70–1.28)
Globulin: 2.5 g/dL (ref 1.9–3.7)
Glucose, Bld: 123 mg/dL — ABNORMAL HIGH (ref 65–99)
Potassium: 3.9 mmol/L (ref 3.5–5.3)
Sodium: 139 mmol/L (ref 135–146)
Total Bilirubin: 0.6 mg/dL (ref 0.2–1.2)
Total Protein: 7.3 g/dL (ref 6.1–8.1)
eGFR: 88 mL/min/1.73m2 (ref >=60)

## 2024-03-12 LAB — CBC WITH DIFFERENTIAL/PLATELET
Absolute Lymphocytes: 2293 {cells}/uL (ref 850–3900)
Absolute Monocytes: 632 {cells}/uL (ref 200–950)
Basophils Absolute: 99 {cells}/uL (ref 0–200)
Basophils Relative: 1.4 %
Eosinophils Absolute: 163 {cells}/uL (ref 15–500)
Eosinophils Relative: 2.3 %
HCT: 44.5 % (ref 39.4–51.1)
Hemoglobin: 15.2 g/dL (ref 13.2–17.1)
MCH: 30.8 pg (ref 27.0–33.0)
MCHC: 34.2 g/dL (ref 31.6–35.4)
MCV: 90.3 fL (ref 81.4–101.7)
MPV: 9.8 fL (ref 7.5–12.5)
Monocytes Relative: 8.9 %
Neutro Abs: 3912 {cells}/uL (ref 1500–7800)
Neutrophils Relative %: 55.1 %
Platelets: 233 Thousand/uL (ref 140–400)
RBC: 4.93 Million/uL (ref 4.20–5.80)
RDW: 13.1 % (ref 11.0–15.0)
Total Lymphocyte: 32.3 %
WBC: 7.1 Thousand/uL (ref 3.8–10.8)

## 2024-03-12 LAB — HEMOGLOBIN A1C
Hgb A1c MFr Bld: 6.6 % — ABNORMAL HIGH (ref <5.7)
Mean Plasma Glucose: 143 mg/dL
eAG (mmol/L): 7.9 mmol/L

## 2024-03-12 LAB — LIPID PANEL
Cholesterol: 231 mg/dL — ABNORMAL HIGH (ref <200)
HDL: 51 mg/dL (ref >=40)
LDL Cholesterol (Calc): 149 mg/dL — ABNORMAL HIGH
Non-HDL Cholesterol (Calc): 180 mg/dL — ABNORMAL HIGH (ref <130)
Total CHOL/HDL Ratio: 4.5 (calc) (ref <5.0)
Triglycerides: 168 mg/dL — ABNORMAL HIGH (ref <150)

## 2024-03-12 LAB — TSH: TSH: 2.51 m[IU]/L (ref 0.40–4.50)

## 2024-03-25 LAB — OPHTHALMOLOGY REPORT-SCANNED

## 2024-03-31 ENCOUNTER — Other Ambulatory Visit: Payer: Self-pay | Admitting: Cardiovascular Disease

## 2024-03-31 DIAGNOSIS — I69391 Dysphagia following cerebral infarction: Secondary | ICD-10-CM

## 2024-03-31 DIAGNOSIS — I152 Hypertension secondary to endocrine disorders: Secondary | ICD-10-CM

## 2024-03-31 DIAGNOSIS — Z7902 Long term (current) use of antithrombotics/antiplatelets: Secondary | ICD-10-CM

## 2024-03-31 DIAGNOSIS — Z09 Encounter for follow-up examination after completed treatment for conditions other than malignant neoplasm: Secondary | ICD-10-CM

## 2024-04-01 ENCOUNTER — Telehealth: Payer: Self-pay | Admitting: Family Medicine

## 2024-04-01 DIAGNOSIS — E1165 Type 2 diabetes mellitus with hyperglycemia: Secondary | ICD-10-CM

## 2024-04-01 MED ORDER — METFORMIN HCL ER 500 MG PO TB24: 1000.0000 mg | ORAL_TABLET | Freq: Every day | ORAL | 0 refills | Status: AC

## 2024-04-01 NOTE — Telephone Encounter (Signed)
metFORMIN (GLUCOPHAGE XR) 500 MG 24 hr tablet

## 2024-05-12 ENCOUNTER — Ambulatory Visit (INDEPENDENT_AMBULATORY_CARE_PROVIDER_SITE_OTHER): Admitting: Vascular Surgery

## 2024-05-12 ENCOUNTER — Encounter (INDEPENDENT_AMBULATORY_CARE_PROVIDER_SITE_OTHER)

## 2024-05-18 ENCOUNTER — Ambulatory Visit: Admitting: Cardiovascular Disease

## 2024-07-10 ENCOUNTER — Ambulatory Visit: Admitting: Family Medicine

## 2025-01-28 ENCOUNTER — Ambulatory Visit
